# Patient Record
Sex: Female | Born: 1950 | Race: White | Hispanic: No | Smoking: Former smoker
Health system: Southern US, Community
[De-identification: ages and names within clinical notes are randomized; demographics above are authoritative.]

## PROBLEM LIST (undated history)

## (undated) DIAGNOSIS — H269 Unspecified cataract: Secondary | ICD-10-CM

## (undated) DIAGNOSIS — J449 Chronic obstructive pulmonary disease, unspecified: Secondary | ICD-10-CM

## (undated) HISTORY — DX: Chronic obstructive pulmonary disease, unspecified: J44.9

## (undated) HISTORY — DX: Unspecified cataract: H26.9

---

## 2006-09-16 HISTORY — PX: COLON SURGERY: SHX602

## 2006-09-16 HISTORY — PX: APPENDECTOMY: SHX54

## 2007-05-14 ENCOUNTER — Inpatient Hospital Stay (HOSPITAL_COMMUNITY): Admission: EM | Admit: 2007-05-14 | Discharge: 2007-05-19 | Payer: Self-pay | Admitting: Emergency Medicine

## 2007-05-14 ENCOUNTER — Encounter (INDEPENDENT_AMBULATORY_CARE_PROVIDER_SITE_OTHER): Payer: Self-pay | Admitting: Surgery

## 2007-05-14 IMAGING — CR DG CHEST 1V PORT
1 series · 1 of 1 positions shown · non-contrast
Comparison: none

CLINICAL DATA: 56-year-old with abdominal pain. 
PORTABLE CHEST:

[view not recorded]
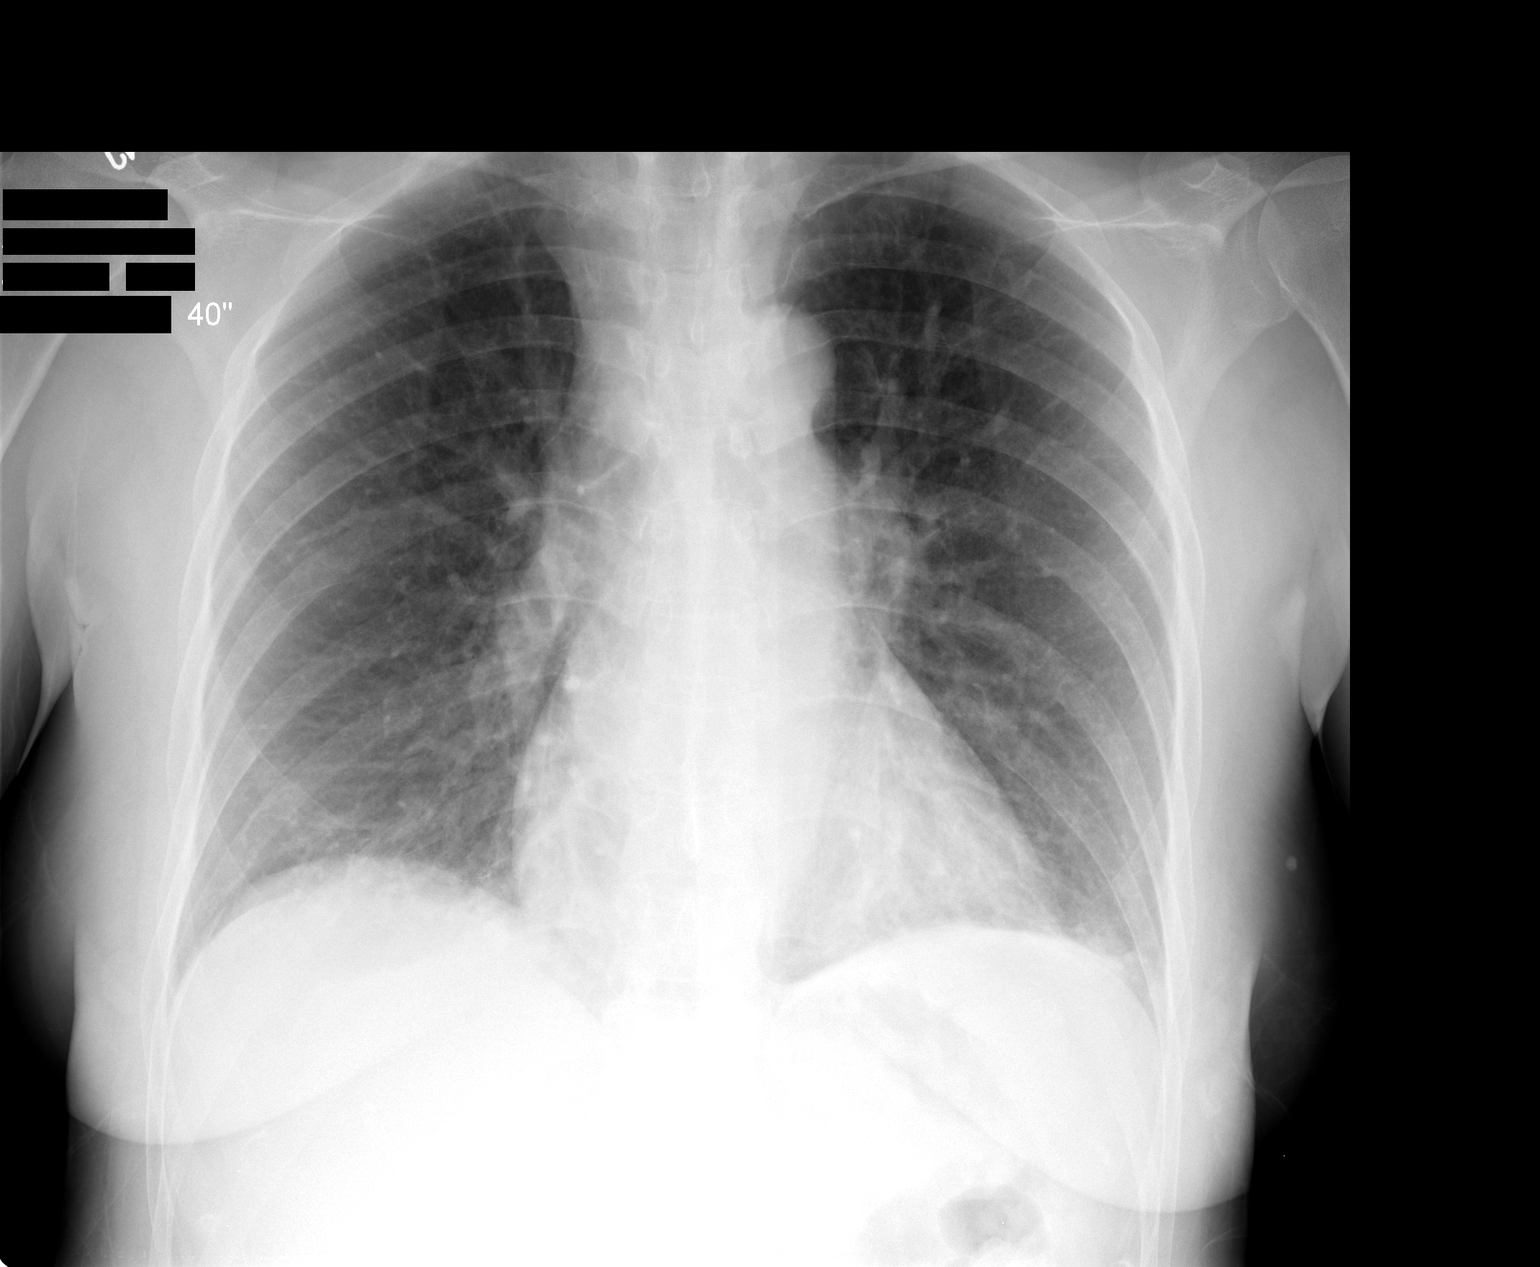

[1 of 1 positions shown; findings below may reference images not displayed]

FINDINGS: Single view of the chest demonstrates prominent vascular markings and there may be a small component of edema or even peribronchial thickening.  There is no focal airspace disease.  Heart size is within normal limits.  The trachea is midline.  Fullness along the right paratracheal region probably related to the technique and overlying bone.   Basilar densities are probably related to atelectasis based on the previous CT findings.  Small amount of edema or chronic changes.
IMPRESSION: Prominent vascular markings.  This could represent a small amount of edema versus chronic change.

## 2007-05-14 IMAGING — CT CT ABDOMEN W/ CM
2 of 6 series · 16 of 46 positions shown, 18 images · IV contrast (omnipaque)
Comparison: none

CLINICAL DATA: Lower abdominal pain.
ABDOMEN CT WITH CONTRAST:
TECHNIQUE: Multidetector CT imaging of the abdomen was performed following the standard protocol during bolus administration of intravenous contrast.
Contrast:  100 cc Omnipaque 300.
TECHNIQUE: Multidetector CT imaging of the pelvis was performed following the standard protocol during bolus administration of intravenous contrast.

[Series 2: abd_pel 5.0 b40f st · axial · 0.65mm/px · z∈[-342,+48]mm · 13 of 90 slices shown, 15 images]
[im 6/90  soft-tissue]
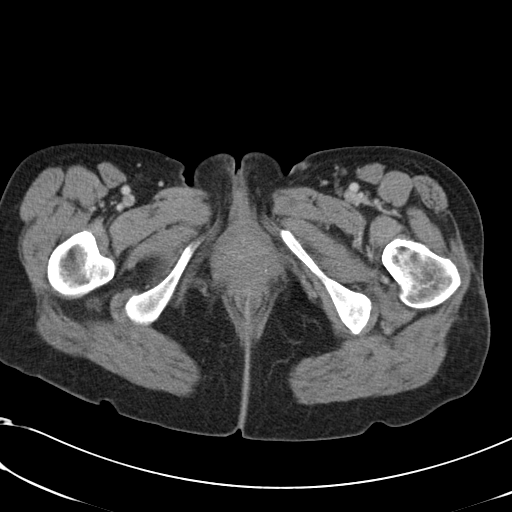
[im 6/90  bone]
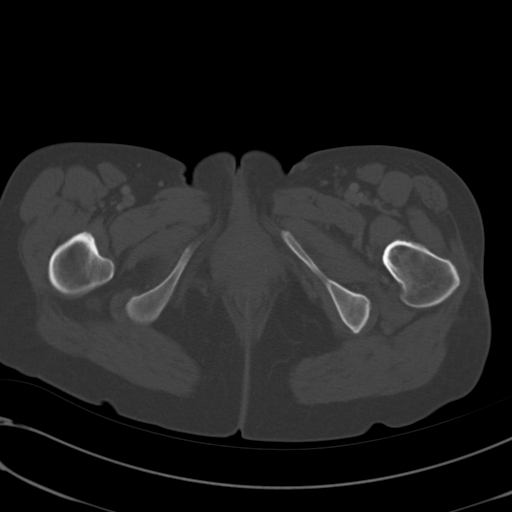
[im 12/90  soft-tissue]
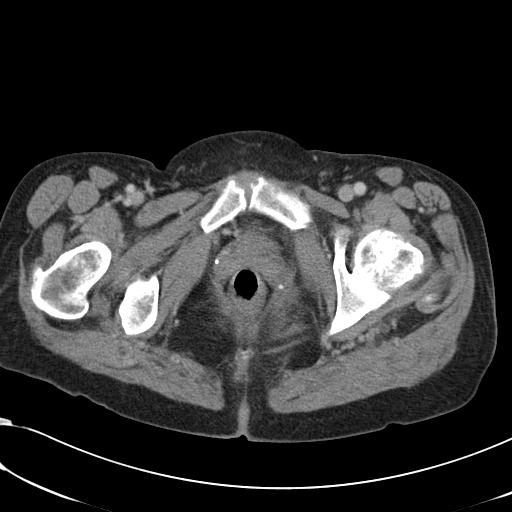
[im 17/90  soft-tissue]
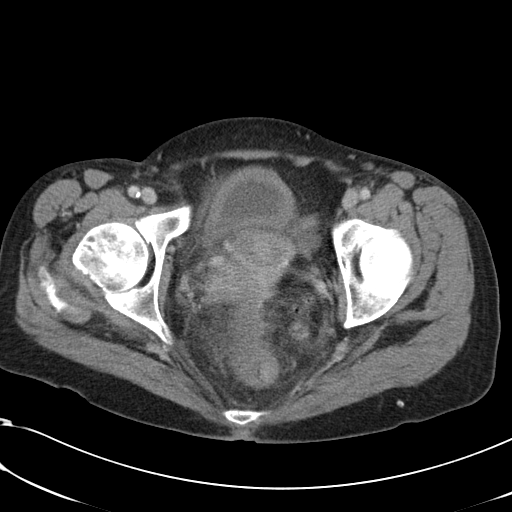
[im 28/90  soft-tissue]
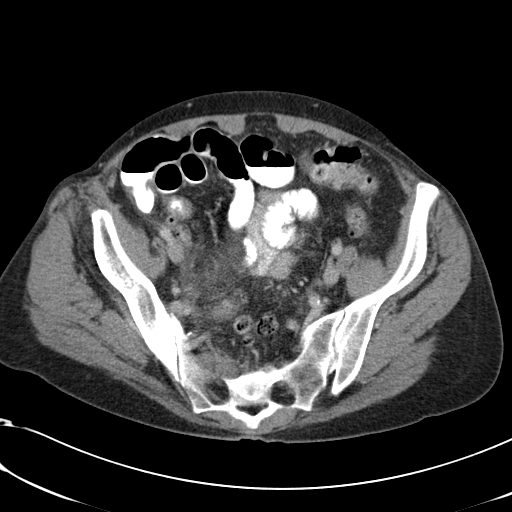
[im 34/90  soft-tissue]
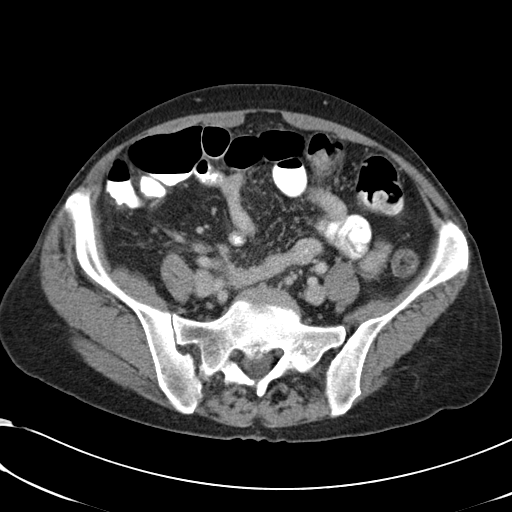
[im 39/90  soft-tissue]
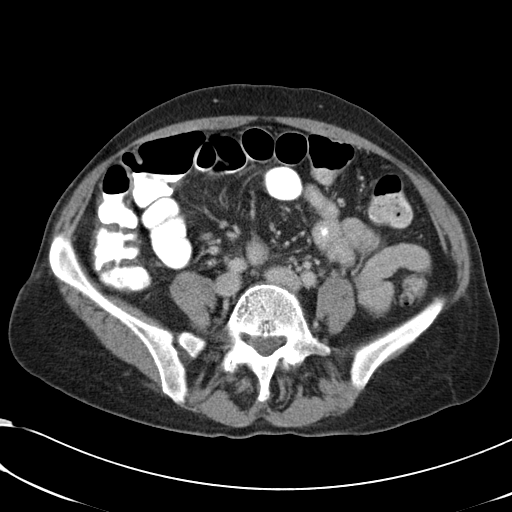
[im 45/90  soft-tissue]
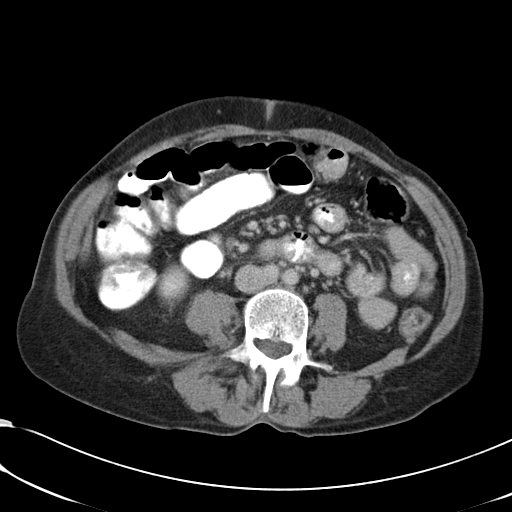
[im 51/90  soft-tissue]
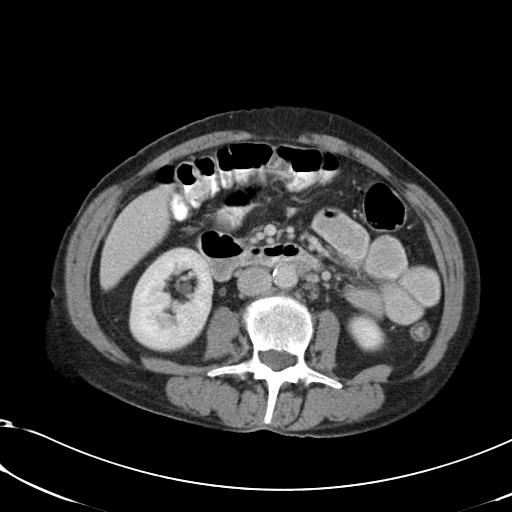
[im 56/90  soft-tissue]
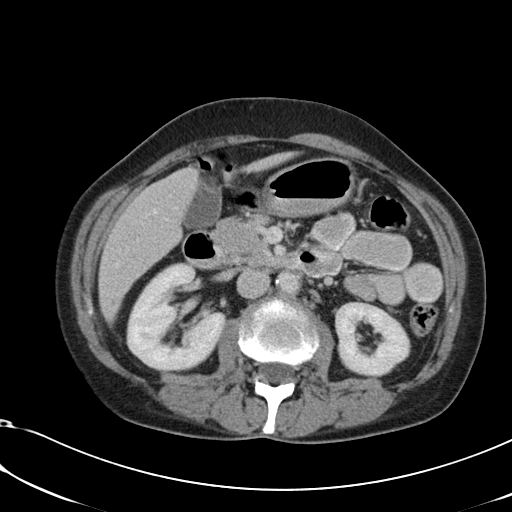
[im 56/90  bone]
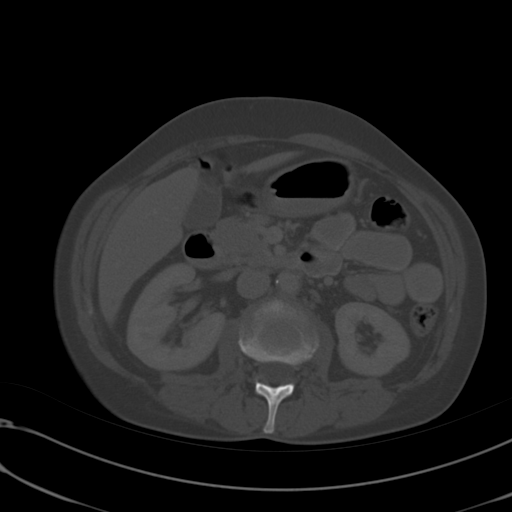
[im 62/90  soft-tissue]
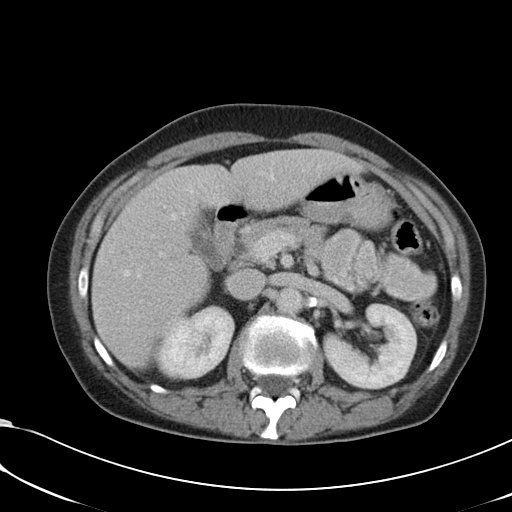
[im 73/90  soft-tissue]
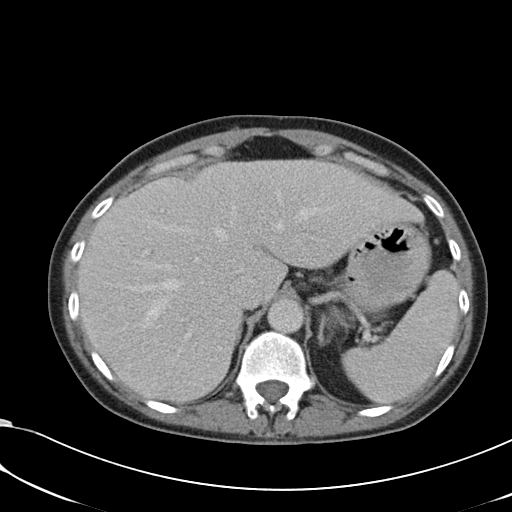
[im 78/90  soft-tissue]
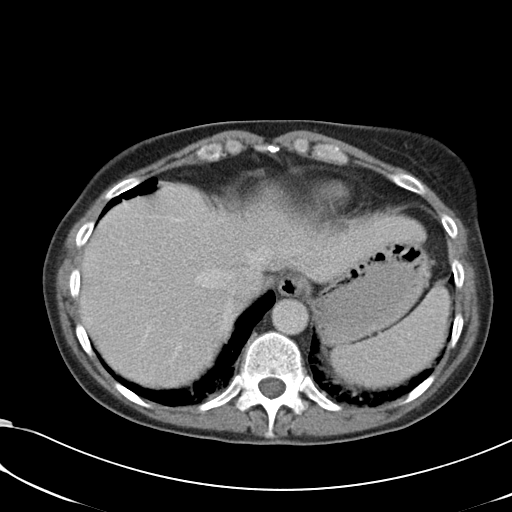
[im 84/90  soft-tissue]
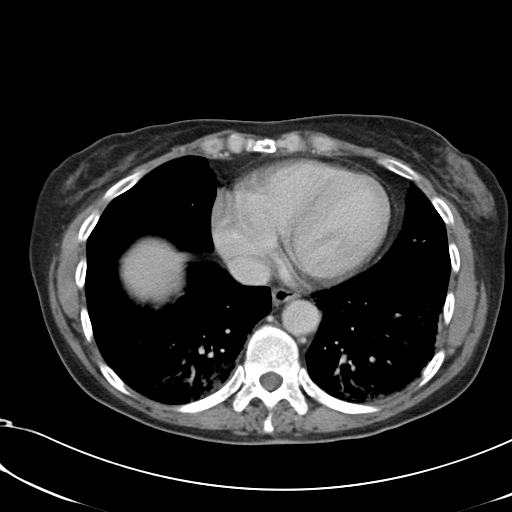

[Series 602: coronal iamges · coronal · 0.91mm/px · 3 of 73 slices shown]
[im 25/73  soft-tissue]
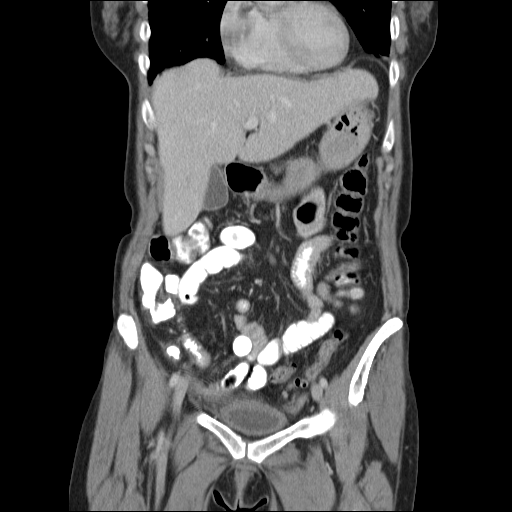
[im 33/73  soft-tissue]
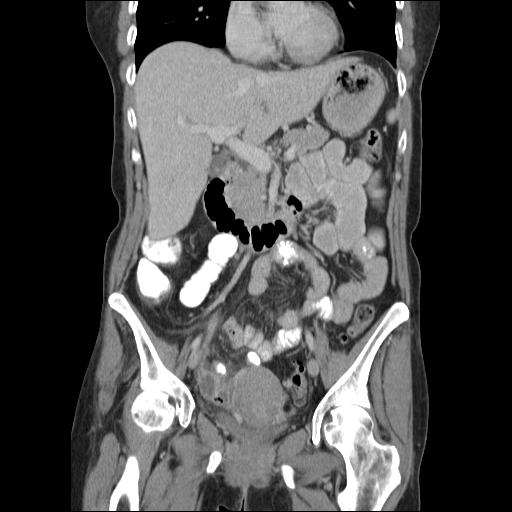
[im 41/73  soft-tissue]
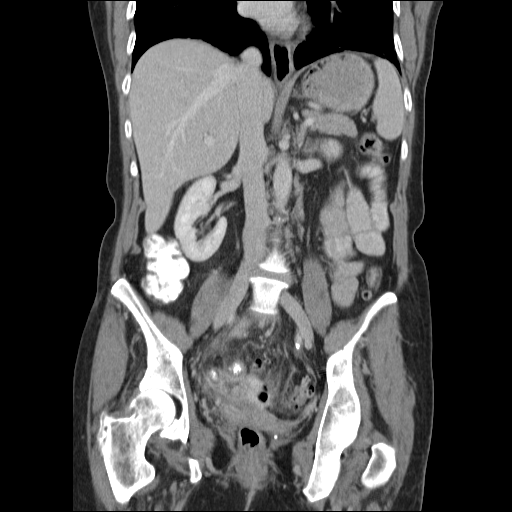

[16 of 46 positions shown; findings below may reference images not displayed]

FINDINGS: The patient has bibasilar atelectasis.  The liver, portal venous system, spleen and pancreas are within normal limits.  There is a 4 mm low density structure along the medial aspect of the uncinate process that is indeterminate.  Pancreatic duct is slightly prominent.   appearance of the adrenal tissue and kidneys.  There is stranding throughout the right lower quadrant with thickened small bowel loops.  The most prominent small bowel thickening involves the terminal ileum.  Lateral to the thickened terminal ileum there are small foci of gas that appear to be extending from the appendix and there are small foci of extraluminal fluid and gas worrisome for small abscess collections.  There is a larger gas-fluid collection posterior to the uterus measuring 4.8 X 3.6 cm also consistent with an abscess collection.  There are multiple diverticula associated with the sigmoid colon.  There is also stranding in the sigmoid mesocolon.  There may be a few small locules of extraluminal gas adjacent to the sigmoid colon.  A few areas of small bowel wall thickening throughout the abdomen.  No acute bone abnormalities.
IMPRESSION: Lower abdominal and pelvic inflammation with evidence of abscess collections.  The largest abscess cavity is posterior to the uterus and adjacent to the sigmoid colon.  There is additional gas and inflammation in the right lower quadrant near the tip of the appendix.  Findings could be related to a rupture of the appendix tip.  A primary or even secondary colonic diverticular abscess cannot be excluded.  Inflammation throughout the right lower quadrant mesentery with loops of small bowel wall thickening. 
Small area of low density along the medial aspect of the uncinate process.  On the delayed images, this probably represents adjacent fat rather than a true pancreatic lesion.   Recommend attention to this area on follow up studies.
PELVIS CT WITH CONTRAST:
FINDINGS: Extensive inflammation in the right lower quadrant with abscess cavities as described in the CT of the abdomen.  Some thickening of the rectum which may be reactive in nature.  Patient also has multiple colonic diverticula involving the sigmoid colon.  No acute bone abnormalities.
IMPRESSION: Extensive inflammation in the pelvis with abscesses.  Please refer to the abdominal CT.

## 2007-05-29 ENCOUNTER — Encounter: Admission: RE | Admit: 2007-05-29 | Discharge: 2007-05-29 | Payer: Self-pay | Admitting: Surgery

## 2007-05-29 ENCOUNTER — Inpatient Hospital Stay (HOSPITAL_COMMUNITY): Admission: AD | Admit: 2007-05-29 | Discharge: 2007-06-12 | Payer: Self-pay | Admitting: Surgery

## 2007-05-29 IMAGING — CT CT PELVIS W/ CM
2 of 6 series · 13 of 32 positions shown, 18 images · IV contrast (30CC OMNI 350 & [ID] OMNI 300)
Comparison: CT [DATE]

ABDOMEN CT WITH CONTRAST

CLINICAL DATA: Surgery 2 weeks ago. Fever and abdominal pain.
TECHNIQUE: Multidetector CT imaging of the abdomen and pelvis was performed
following the standard protocol during bolus administration of intravenous
contrast.

Contrast:  100 cc Omnipaque 300

[Series 2: abdomen w/ · axial · 0.65mm/px · z∈[-276,-11]mm · 5 of 81 slices shown, 10 images]
[im 14/81  soft-tissue]
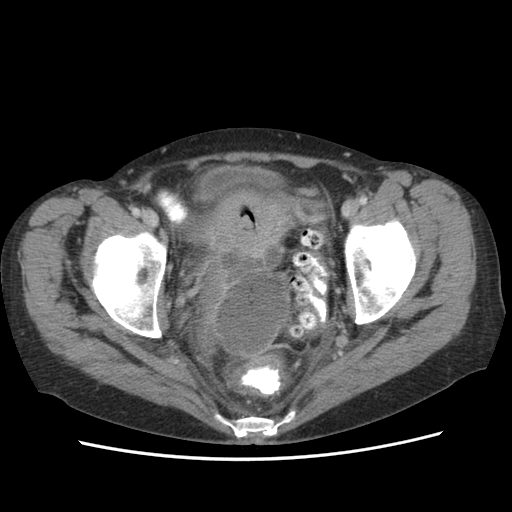
[im 14/81  bone]
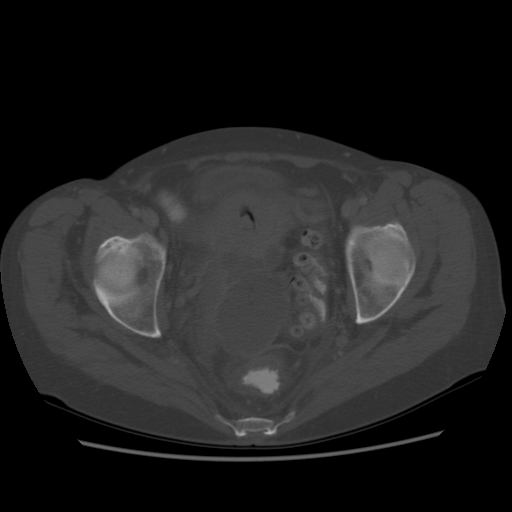
[im 27/81  soft-tissue]
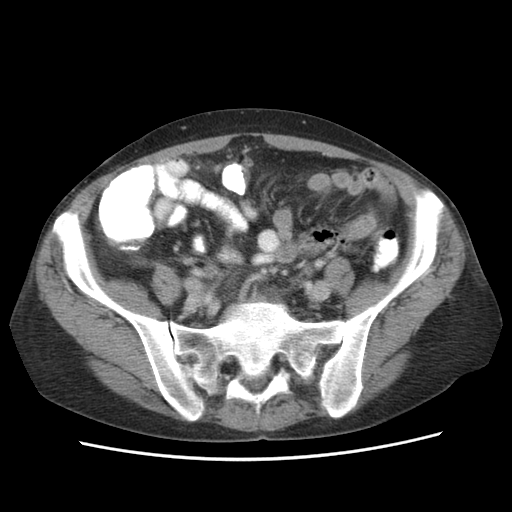
[im 27/81  lung]
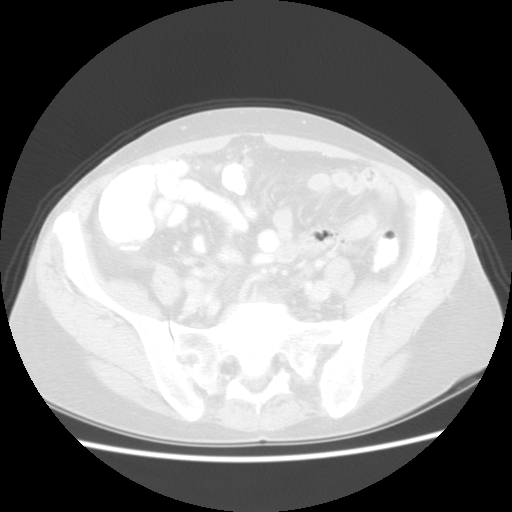
[im 41/81  soft-tissue]
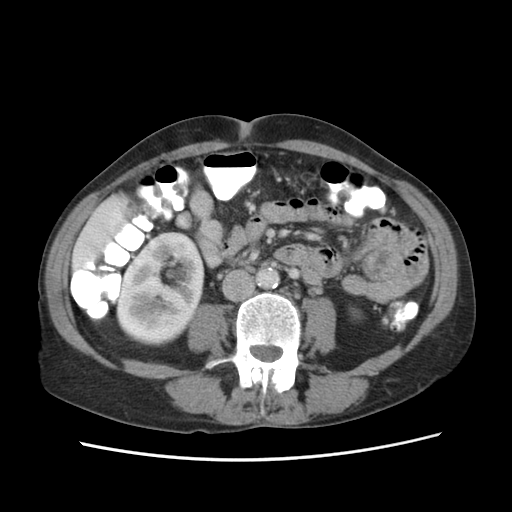
[im 41/81  lung]
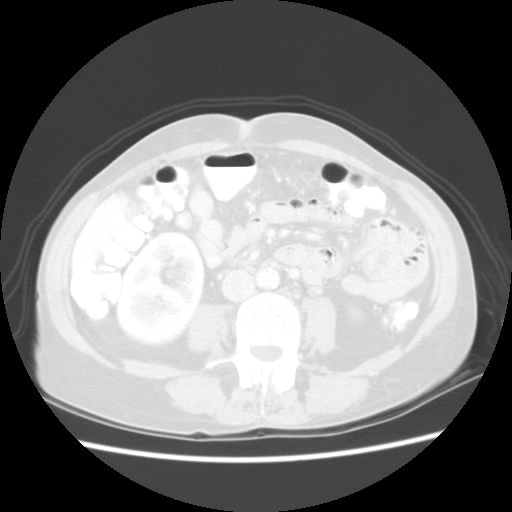
[im 54/81  soft-tissue]
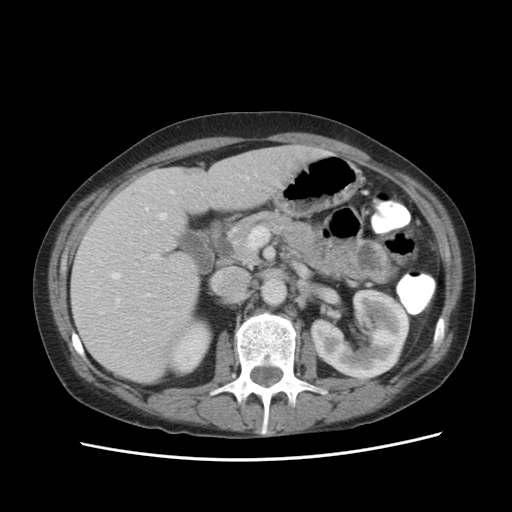
[im 54/81  lung]
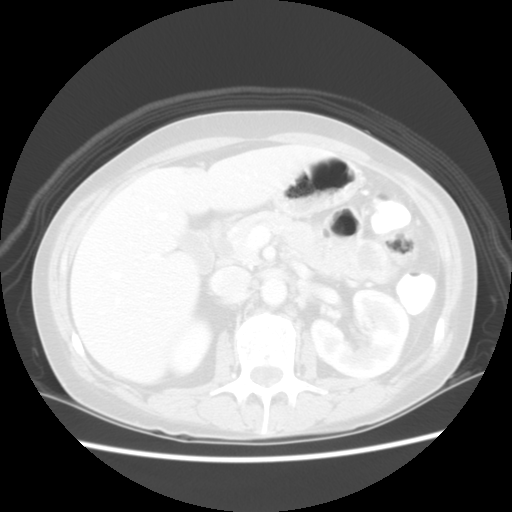
[im 67/81  soft-tissue]
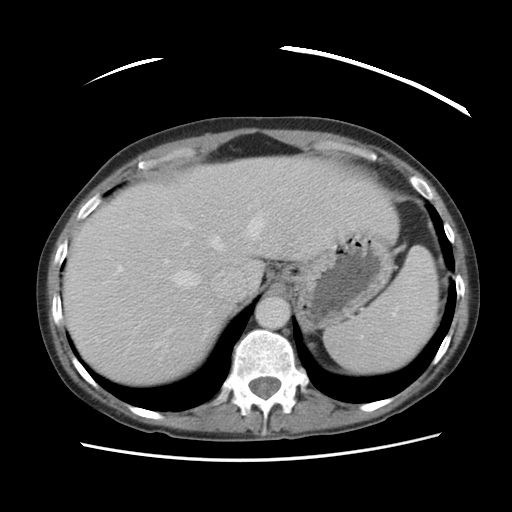
[im 67/81  lung]
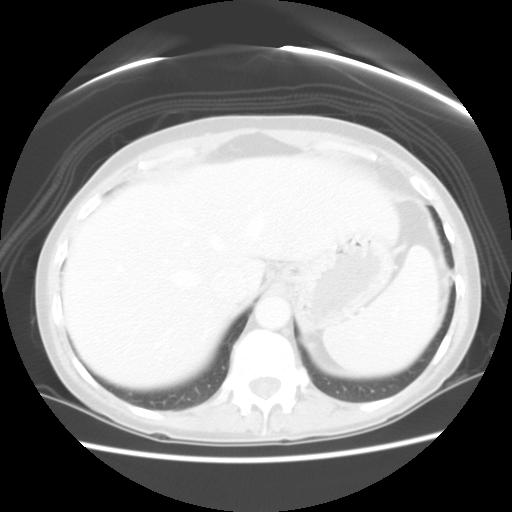

[Series 401: sagittal · sagittal · 0.80mm/px · 8 of 118 slices shown]
[im 14/118  soft-tissue]
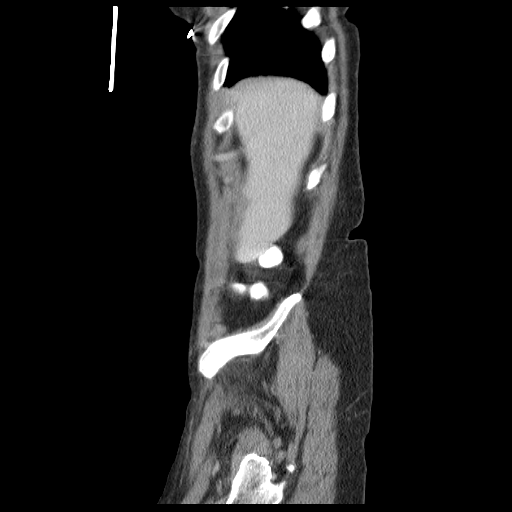
[im 27/118  soft-tissue]
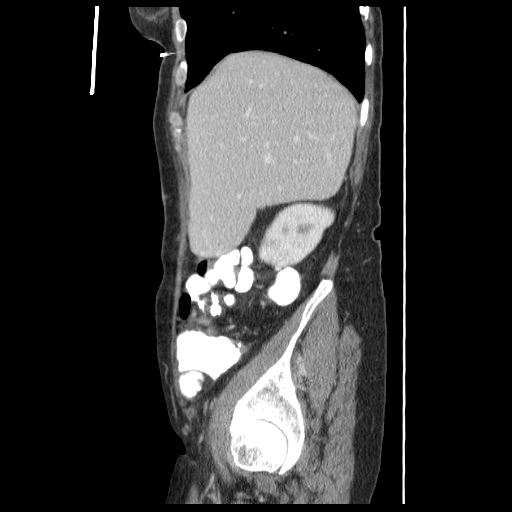
[im 40/118  soft-tissue]
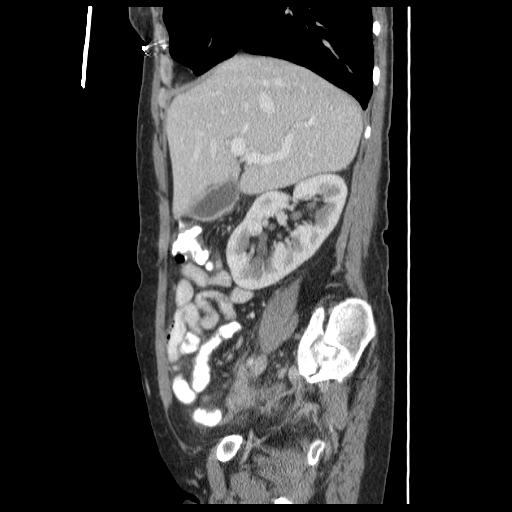
[im 53/118  soft-tissue]
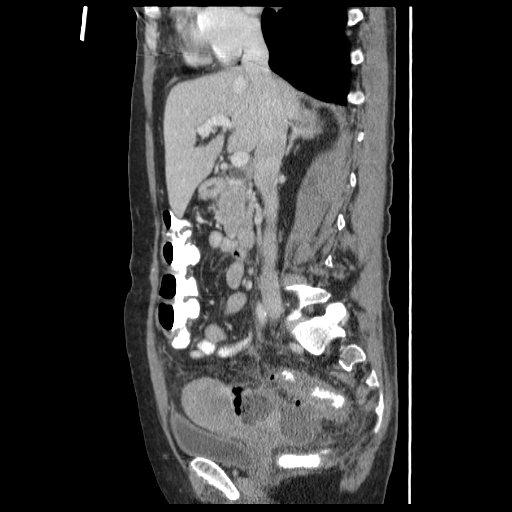
[im 66/118  soft-tissue]
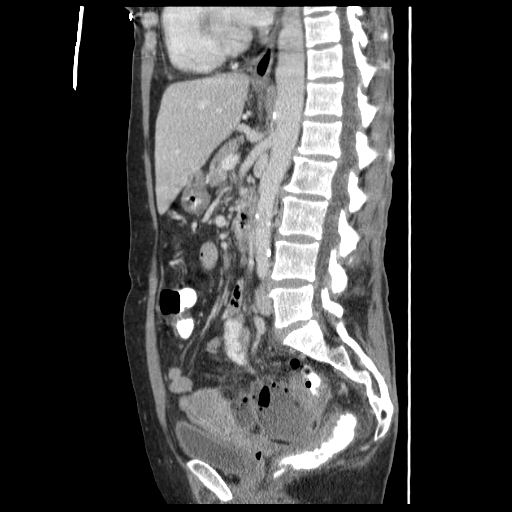
[im 79/118  soft-tissue]
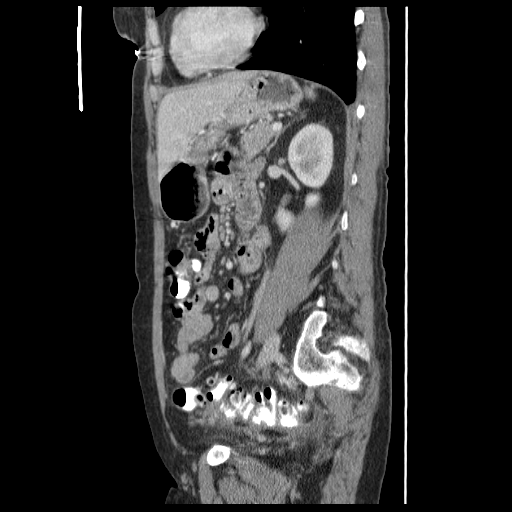
[im 92/118  soft-tissue]
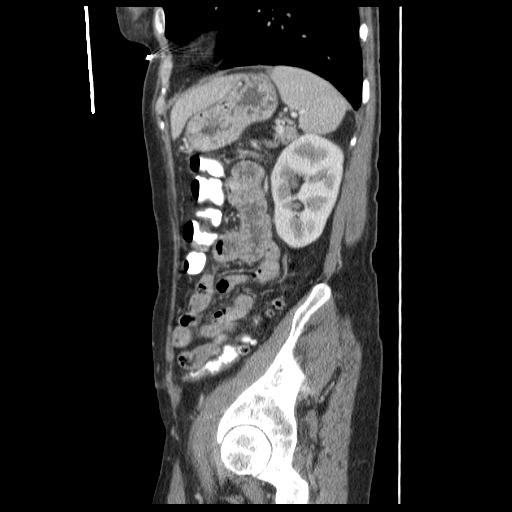
[im 105/118  soft-tissue]
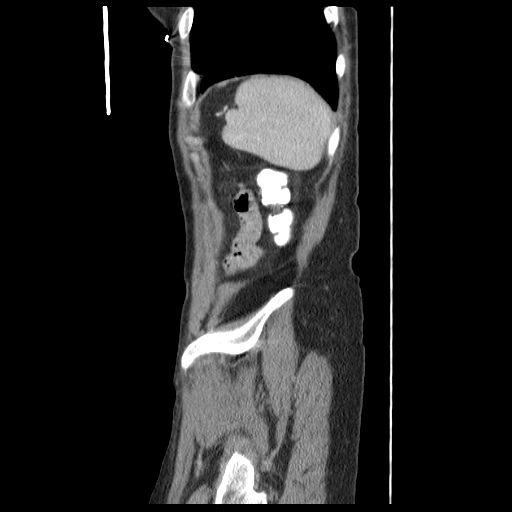

[13 of 32 positions shown; findings below may reference images not displayed]

FINDINGS: Mild centrilobular emphysema at lung bases. Normal heart size without
pericardial or pleural effusion.

Too small to characterize  lesion in the right lobe of the liver on image 35.
Likely a cyst. Perfusion anomaly adjacent to falciform ligament. Tiny likely
benign lesion splenic dome on image 11.

Normal stomach, pancreas, gallbladder, biliary tree, adrenal glands. Normal
kidneys. Atherosclerosis at the left renal artery origin.

No retrocrural or retroperitoneal adenopathy.

Normal abdominal portions of colon. Terminal ileum normal image 54.

Normal abdominal small bowel loops.

IMPRESSION

1. No acute abdominal process.
2. Age advanced atherosclerosis of left renal artery origin. No evidence of
renal atrophy or other sequela of renal artery stenosis, which cannot be
excluded.

PELVIS CT WITH CONTRAST
FINDINGS: New rectal wall thickening. Increased sigmoid colon wall thickening
superimposed on underlying diverticulosis. Pelvic small bowel loops normal. 

Multiseptated abscesses. Largest component centrally at 4.4 x 5.9 cm on image
69. Increased from 4.8 x 3.6 on prior exam. Contiguous component extends towards
the right adnexa where a collection measures approximately 5.5 x 3.6 cm. Image
65. More superiorly gas tracks along the right gonadal vein.   

Hyperdensity within the left side of the largest abscess on image 65 series 2 is
suspicious for contrast leak versus focal diverticula. Image 65 series 2.

No pelvic adenopathy.

New gas within the endometrium on images 66-67. No free perforation.

Normal bones.

IMPRESSION

1. Progression of pelvic abscess or abscesses as described.
2. Progressive rectal and sigmoid wall thickening likely relates to
diverticulitis.
3. New air within the endometrium. Presuming no recent instrumentation, this is
suspicious for secondary endometritis. Given the collection in the right adnexal
and gas tracking along the right gonadal vein, infectious along the right
fallopian tube or broad ligament is suspected. 
4. Small focus of increased density within the above-described abscess could
relate to contrast leak from adjacent sigmoid colon or a contrast-filled
diverticulum.

 I called and personally discussed this report with Dr. BRITTANIE , at [DATE] p.m. on
[DATE] .

## 2007-05-30 IMAGING — CT CT ABCESS DRAINAGE
1 of 6 series · 14 of 32 positions shown, 19 images · non-contrast
Comparison: none

CLINICAL DATA: Large pelvic abscess.
CT GUIDED ABSCESS DRAINAGE:
Medications:  Versed 2 mg and Fentanyl 50 mcg.
Procedure:  Informed consent was obtained from the patient.  The risks of the procedure were explained to the patient.  The patient was placed in prone position.  Large abscess collection anterior the rectum was identified.  Compared to the previous diagnostic study, there is now oral contrast within this abscess cavity consistent with a bowel leak.  The right buttock was prepped and draped in sterile fashion.  Skin was anesthetized with 1% lidocaine.  An 18-gauge needle was directed from a transgluteal approach into the large pelvic abscess.  An Amplatz wire was placed.  The track was dilated and 12-French drainage catheter was placed.  100 cc of purulent fluid was removed.  The patient tolerated the procedure well without complication.  The catheter was secured to the skin.

[Series 2: stone_wo 5.0 b40f st · axial · 0.67mm/px · z∈[-1019,-799]mm · 14 of 63 slices shown, 19 images]
[im 4/63  soft-tissue]
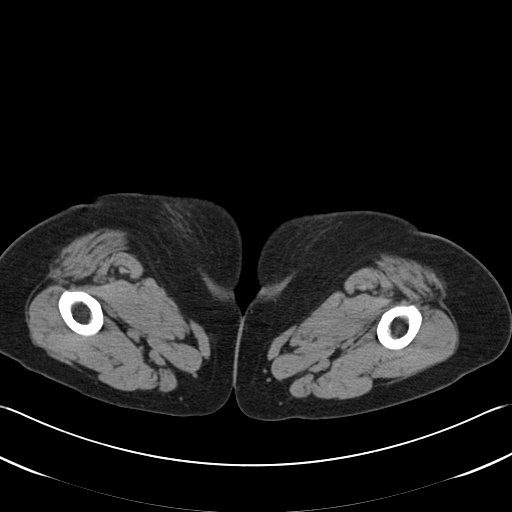
[im 4/63  bone]
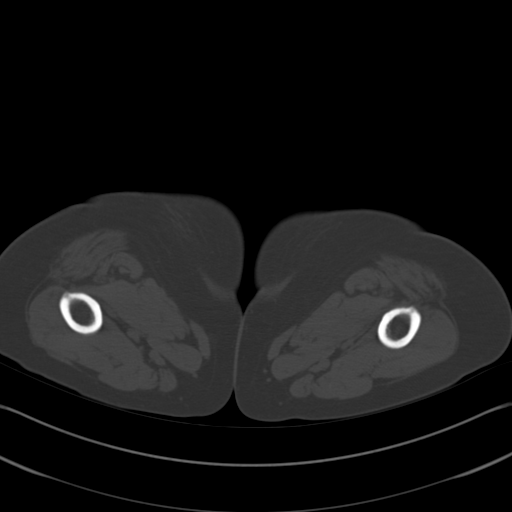
[im 8/63  soft-tissue]
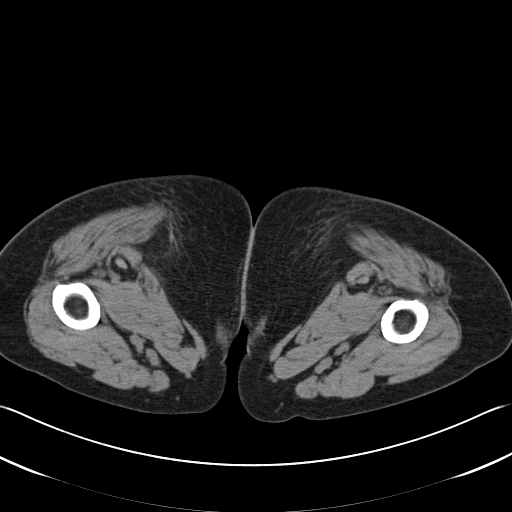
[im 12/63  soft-tissue]
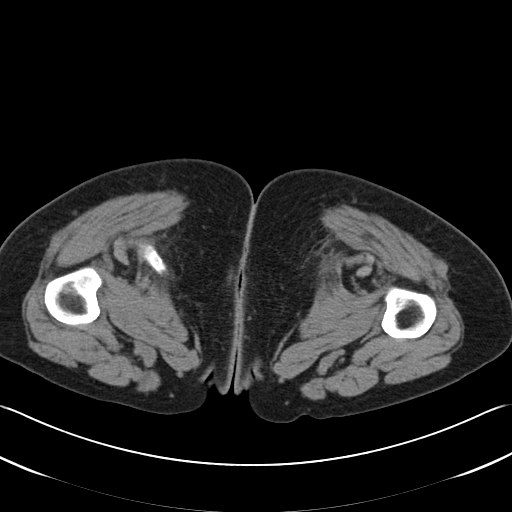
[im 20/63  soft-tissue]
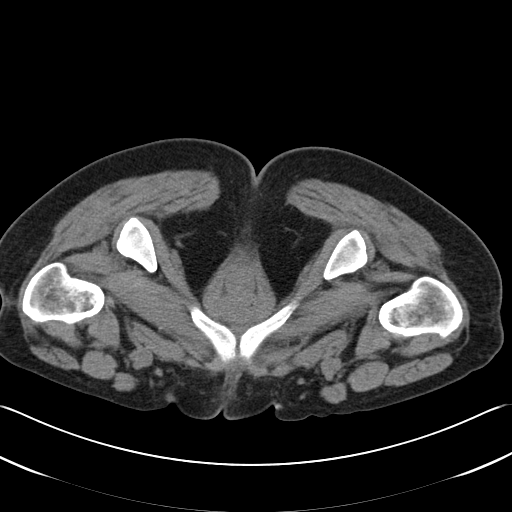
[im 24/63  soft-tissue]
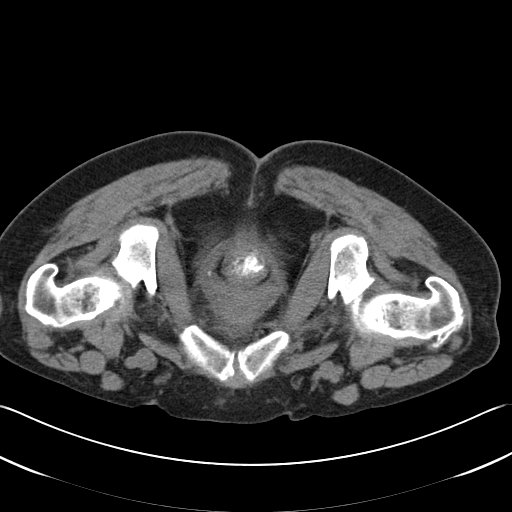
[im 28/63  soft-tissue]
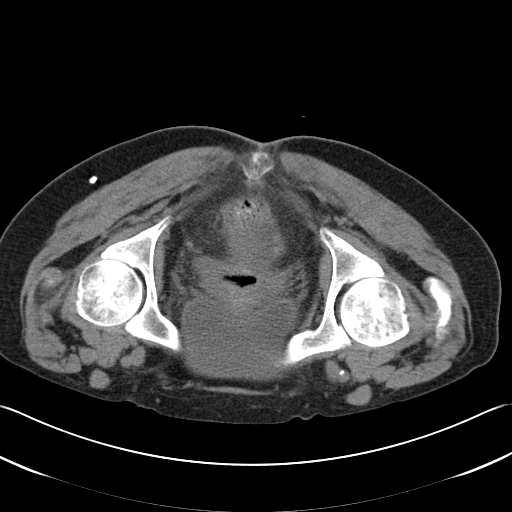
[im 32/63  soft-tissue]
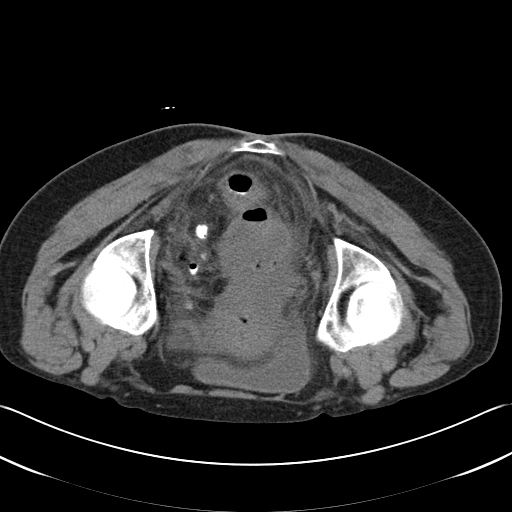
[im 35/63  soft-tissue]
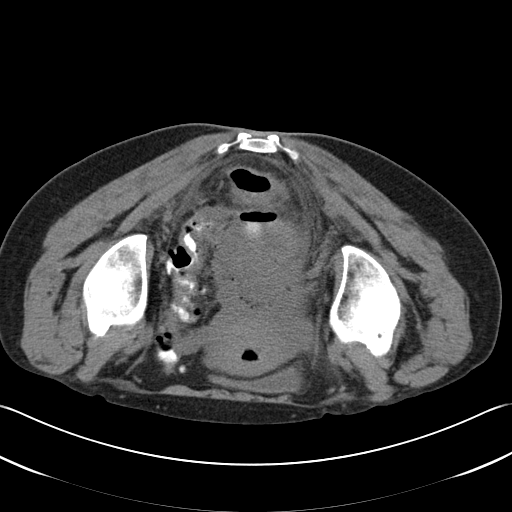
[im 39/63  soft-tissue]
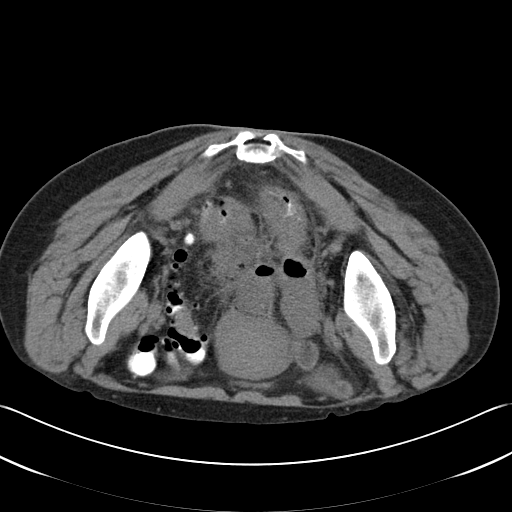
[im 39/63  bone]
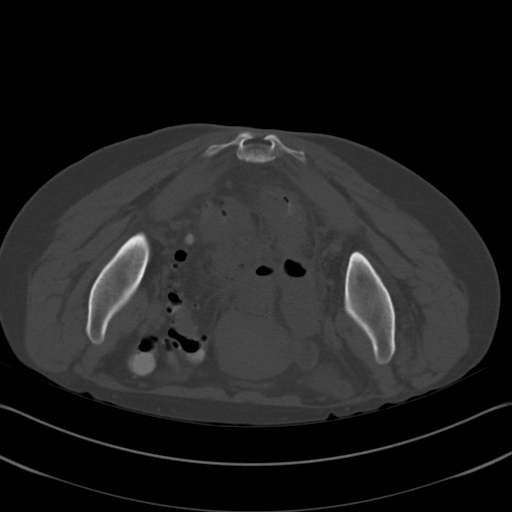
[im 43/63  soft-tissue]
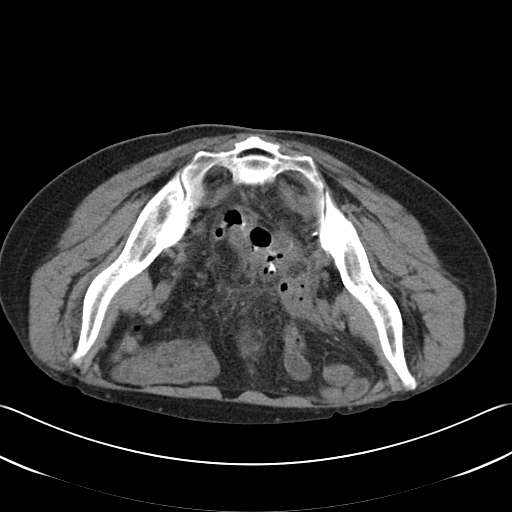
[im 47/63  lung]
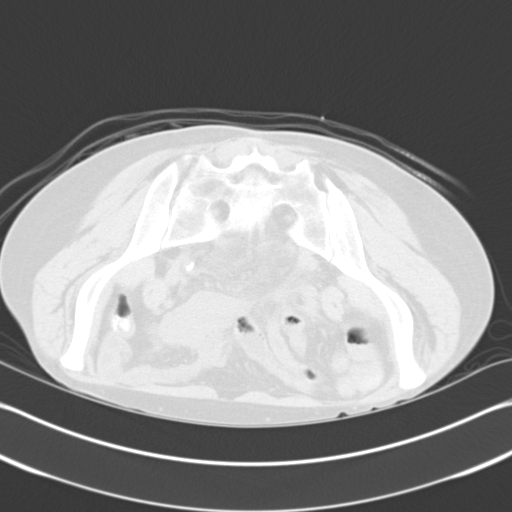
[im 51/63  soft-tissue]
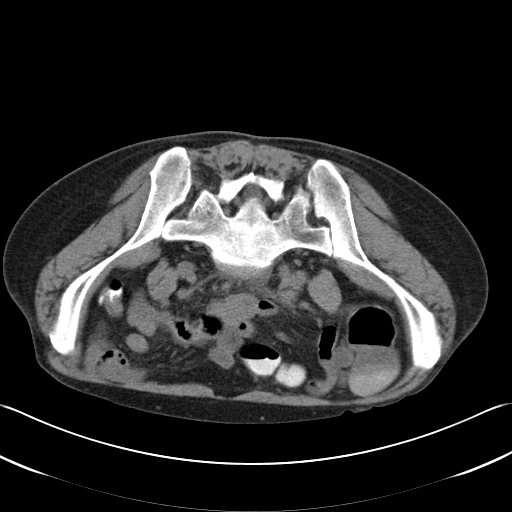
[im 51/63  lung]
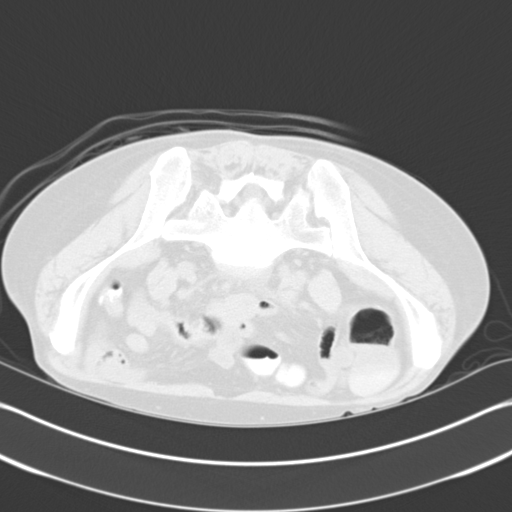
[im 55/63  soft-tissue]
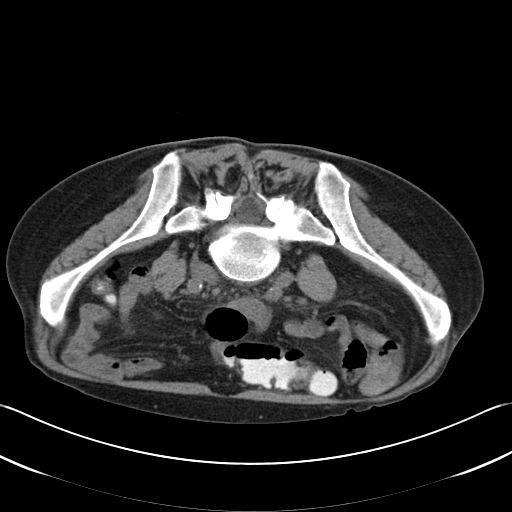
[im 55/63  lung]
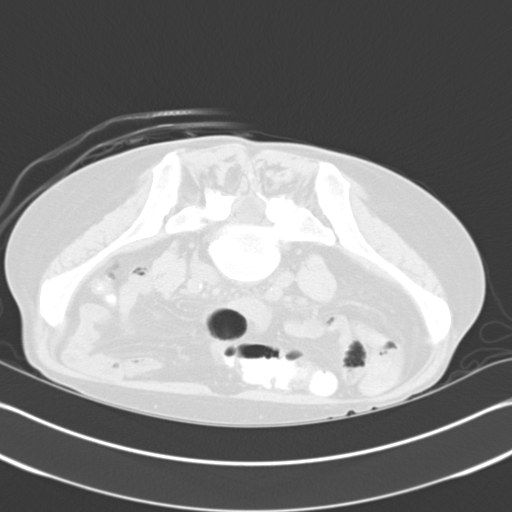
[im 59/63  soft-tissue]
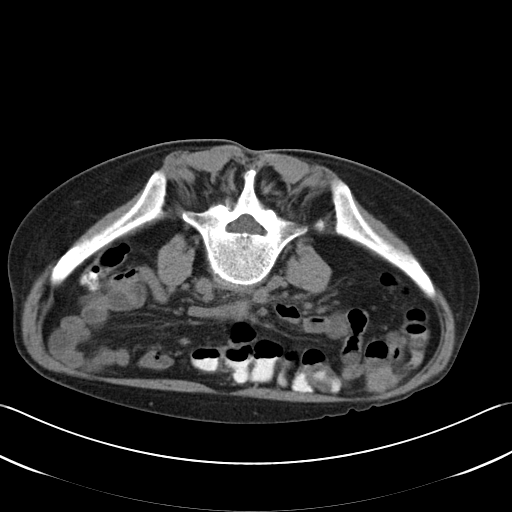
[im 59/63  lung]
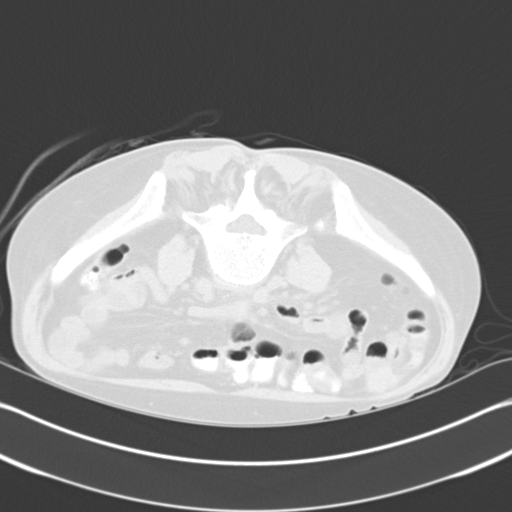

[14 of 32 positions shown; findings below may reference images not displayed]

IMPRESSION: Successful placement of a percutaneous abscess drain from a transgluteal approach.

## 2007-06-04 IMAGING — RF DG SINUS / FISTULA TRACT / ABSCESSOGRAM
8 series · 8 of 8 positions shown · IV contrast (omnipaque)
Comparison: none

CLINICAL DATA: Diverticular abscess.
SINUS TRACT INJECTION:
Procedure:  With the patient prone, the drainage catheter was injected.  Initially, the abscess pouch filled. Almost immediately, one could see communication through a fistulous channel, with the sigmoid colon.  Only 6 cc of Omnipaque 300 was needed to demonstrate the communication with the sigmoid.  The catheter was drained of the contrast injected, and it was flushed with saline.

[Series 1: run · 1 of 1 slices shown (1 of 7)]
[im 1/1]
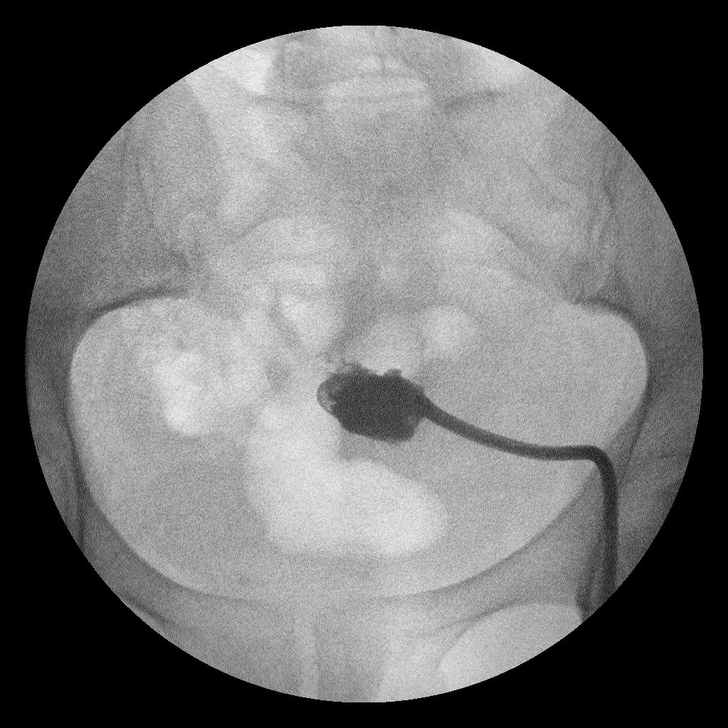

[Series 2: run · 1 of 1 slices shown (2 of 7)]
[im 1/1]
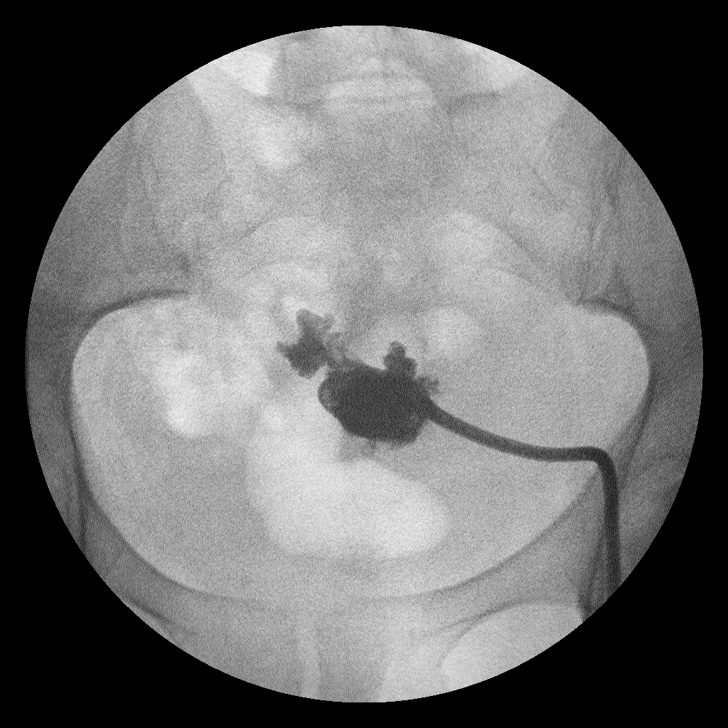

[Series 3: run · 1 of 1 slices shown (3 of 7)]
[im 1/1]
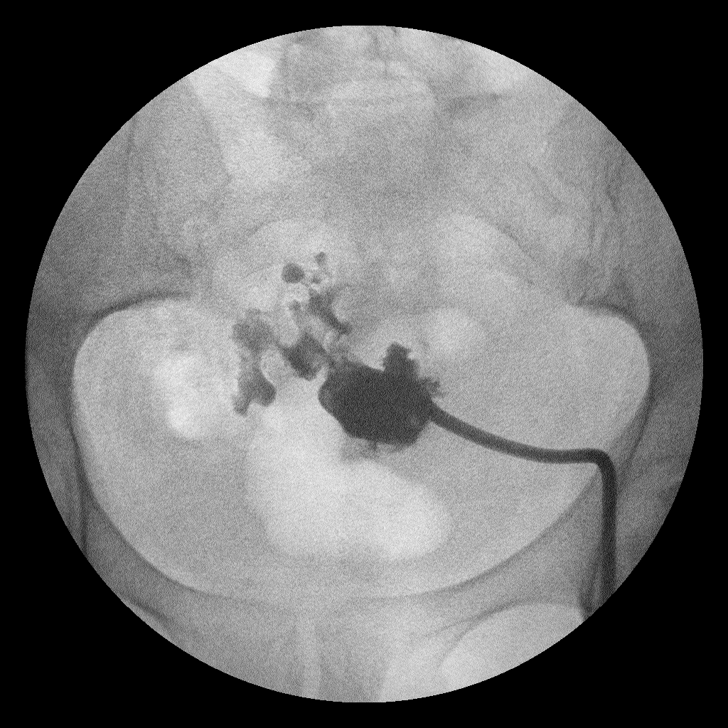

[Series 4: run · 1 of 1 slices shown (4 of 7)]
[im 1/1]
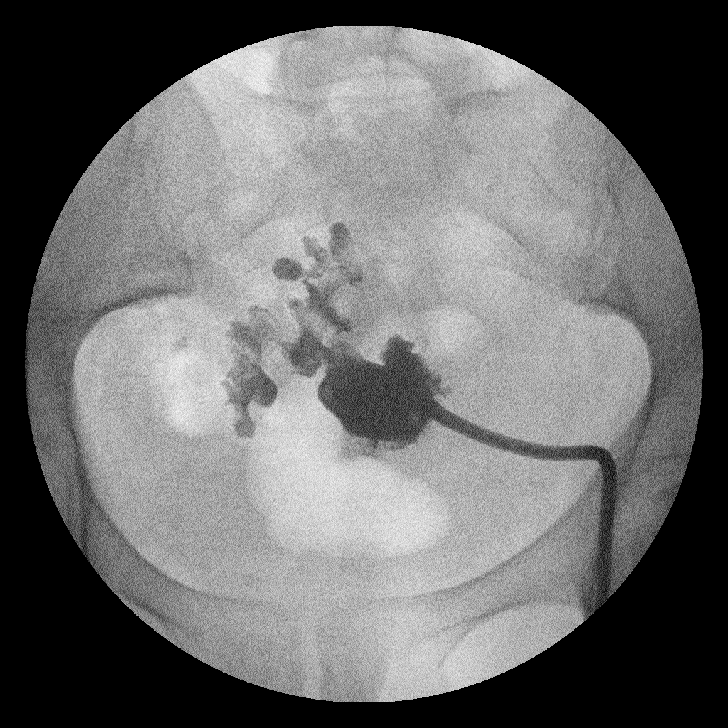

[Series 5: run · 1 of 1 slices shown (5 of 7)]
[im 1/1]
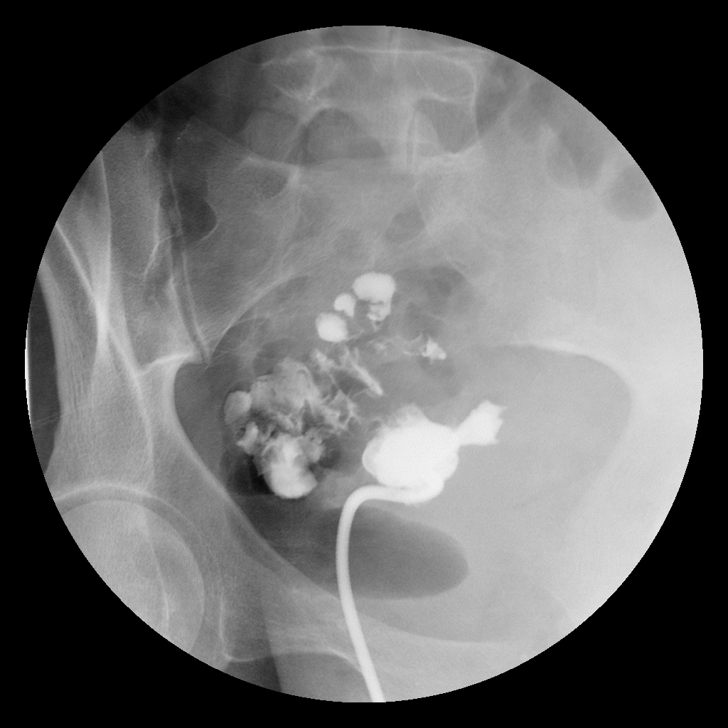

[Series 6: run · 1 of 1 slices shown (6 of 7)]
[im 1/1]
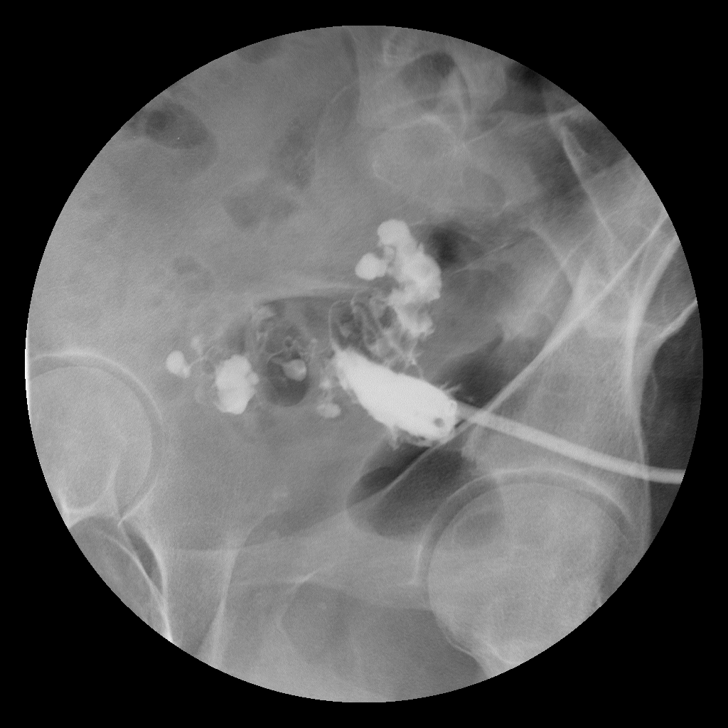

[Series 7: run · 1 of 1 slices shown (7 of 7)]
[im 1/1]
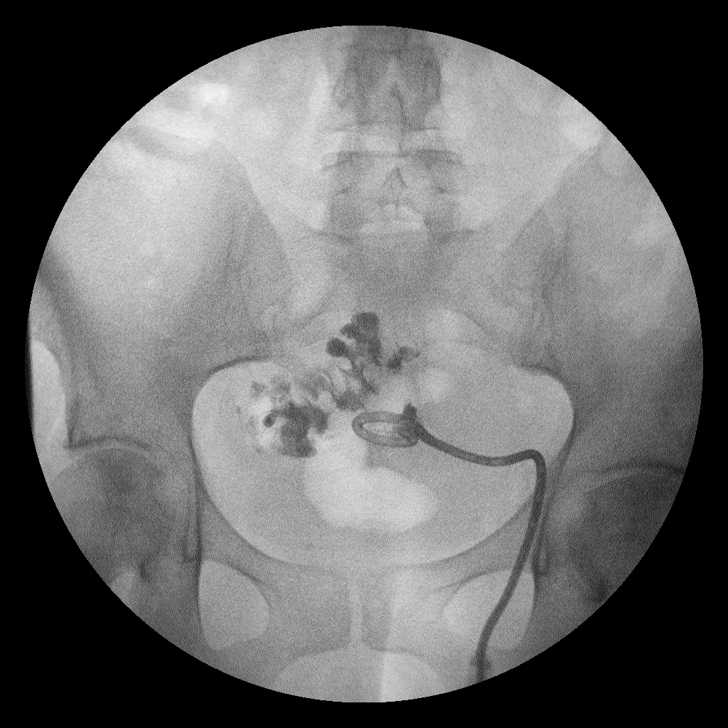

[Series 1001: view not recorded · 0.20mm/px · 1 of 1 slices shown]
[im 1/1]
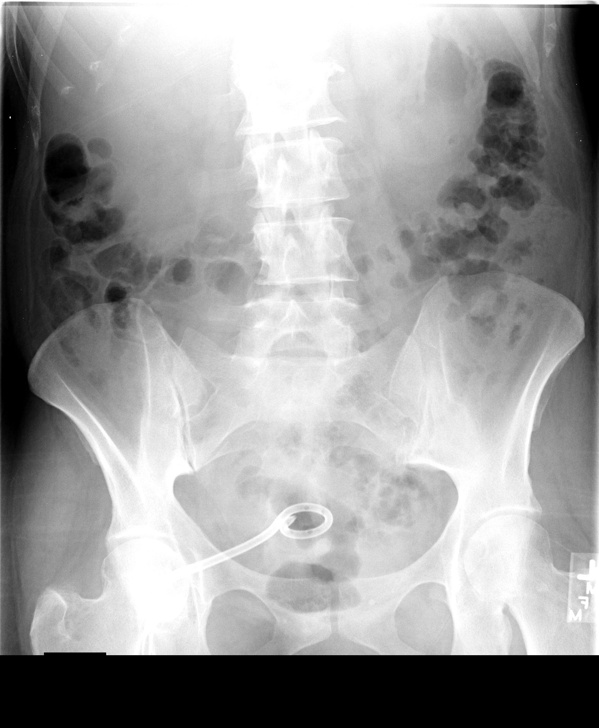

[8 of 8 positions shown; findings below may reference images not displayed]

IMPRESSION: Abscess cavity shows communication with the sigmoid colon.

## 2007-06-05 ENCOUNTER — Encounter (INDEPENDENT_AMBULATORY_CARE_PROVIDER_SITE_OTHER): Payer: Self-pay | Admitting: Surgery

## 2007-09-17 HISTORY — PX: COLOSTOMY REVERSAL: SHX5782

## 2007-11-13 ENCOUNTER — Encounter: Admission: RE | Admit: 2007-11-13 | Discharge: 2007-11-13 | Payer: Self-pay | Admitting: Surgery

## 2007-11-13 IMAGING — RF DG BE THRU COLOSTOMY
17 of 18 series · 17 of 18 positions shown · IV contrast (agent unspecified)
Comparison: none

CLINICAL DATA: History of diverticulitis.  Pre op for colostomy take down. 
DIAGNOSTIC COLON WITH CONTRAST MEDIA THROUGH COLOSTOMY AND PER RECTAL:

[Series 1: run · 1 of 1 slices shown (1 of 15)]
[im 1/1]
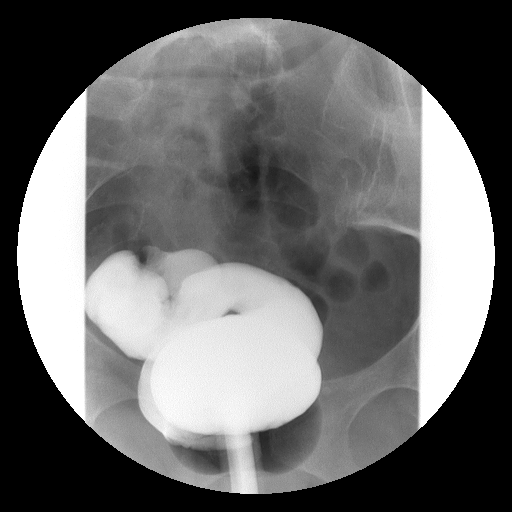

[Series 2: run · 1 of 1 slices shown (2 of 15)]
[im 1/1]
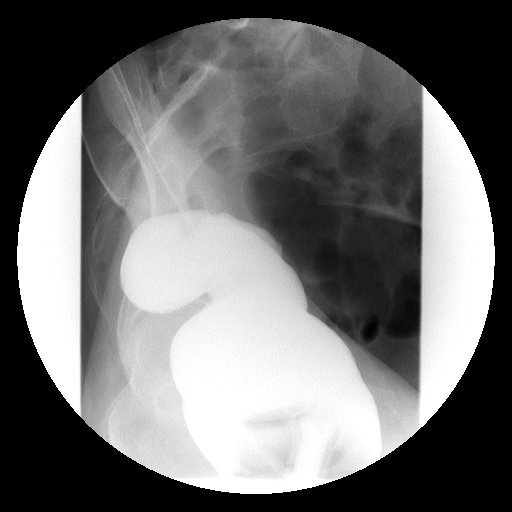

[Series 3: run · 1 of 1 slices shown (3 of 15)]
[im 1/1]
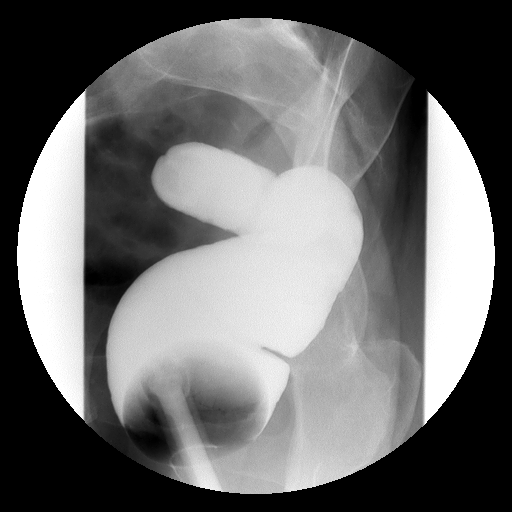

[Series 4: run · 1 of 1 slices shown (4 of 15)]
[im 1/1]
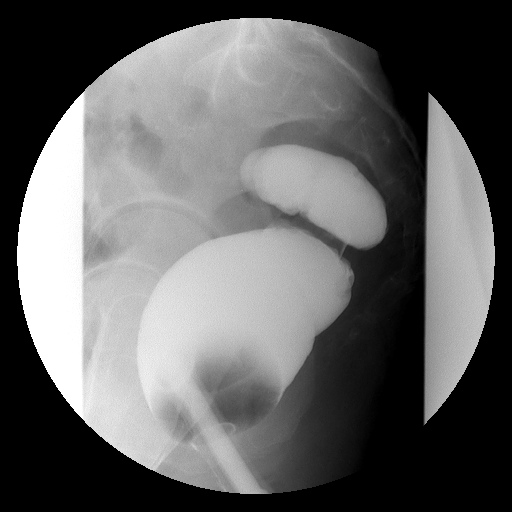

[Series 5: run · 1 of 1 slices shown (5 of 15)]
[im 1/1]
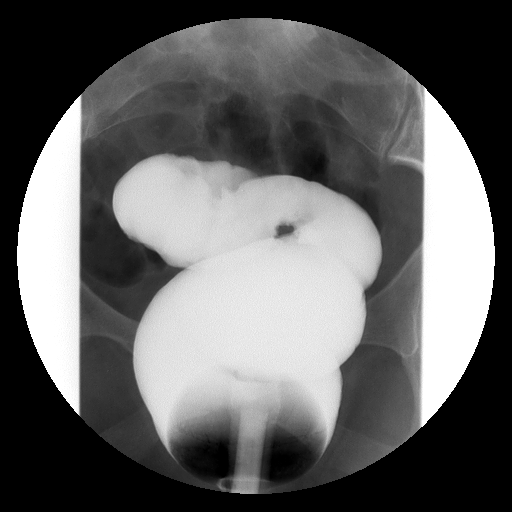

[Series 6: run · 1 of 1 slices shown (6 of 15)]
[im 1/1]
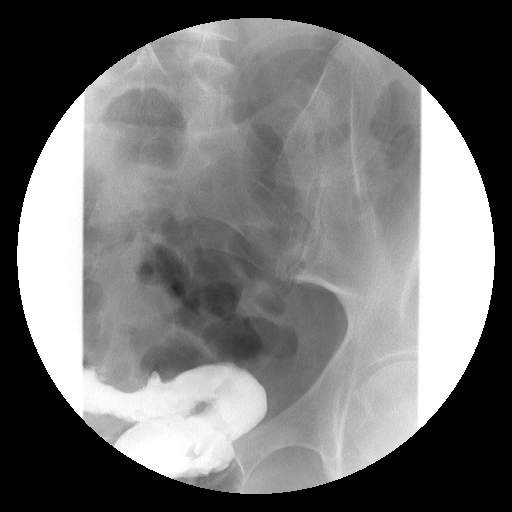

[Series 7: run · 1 of 1 slices shown (7 of 15)]
[im 1/1]
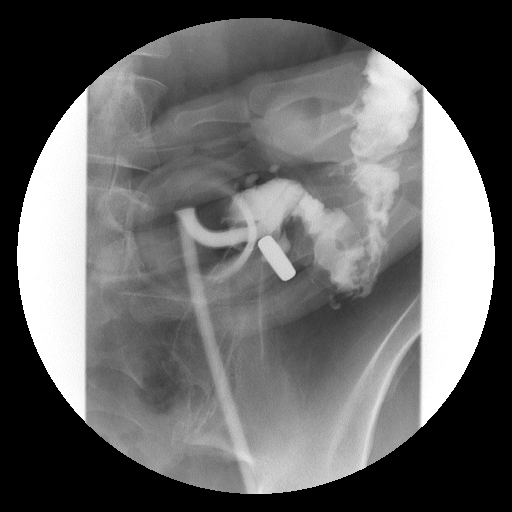

[Series 8: run · 1 of 1 slices shown (8 of 15)]
[im 1/1]
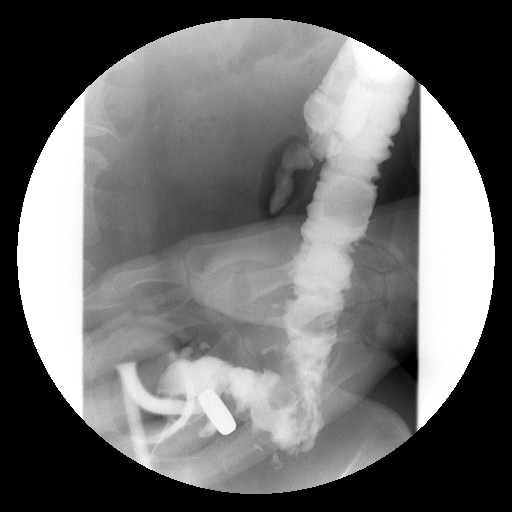

[Series 10: run · 1 of 1 slices shown (9 of 15)]
[im 1/1]
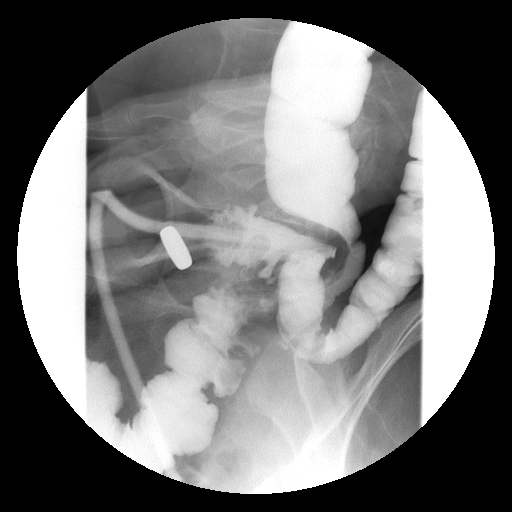

[Series 11: run · 1 of 1 slices shown (10 of 15)]
[im 1/1]
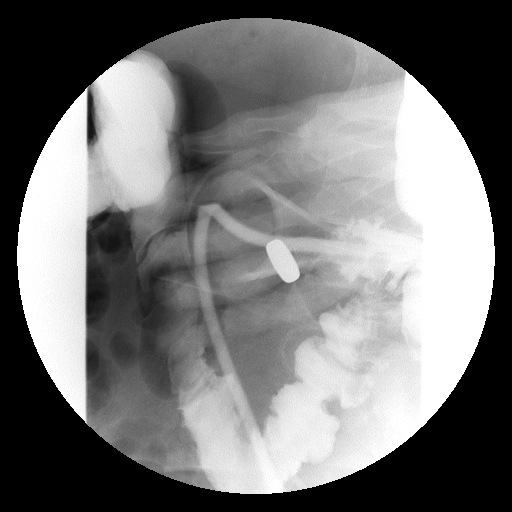

[Series 12: run · 1 of 1 slices shown (11 of 15)]
[im 1/1]
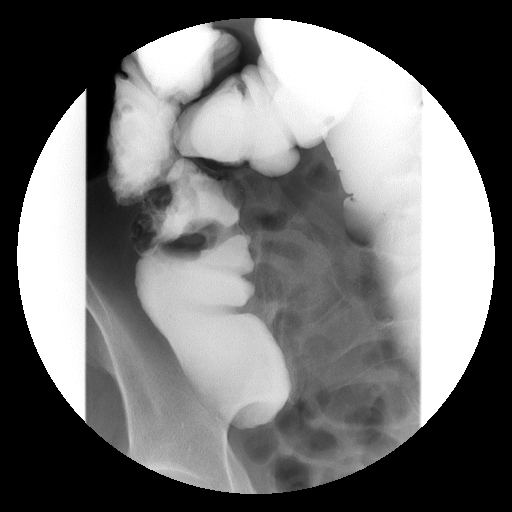

[Series 13: run · 1 of 1 slices shown (12 of 15)]
[im 1/1]
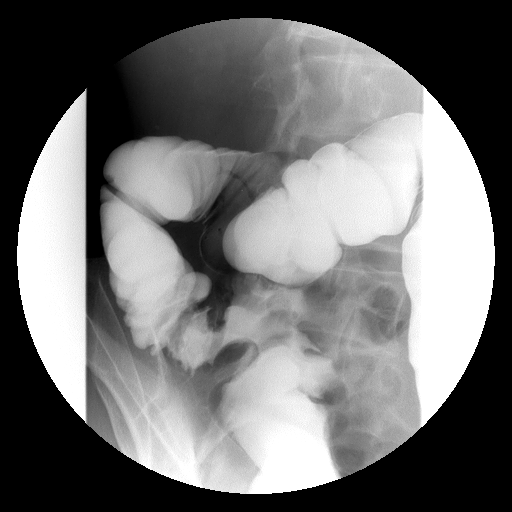

[Series 14: run · 1 of 1 slices shown (13 of 15)]
[im 1/1]
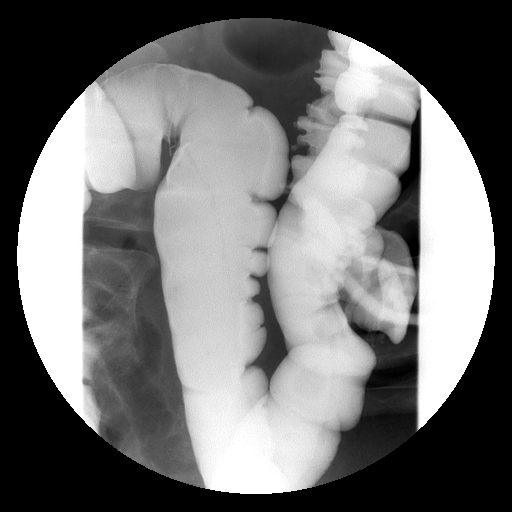

[Series 15: run · 1 of 1 slices shown (14 of 15)]
[im 1/1]
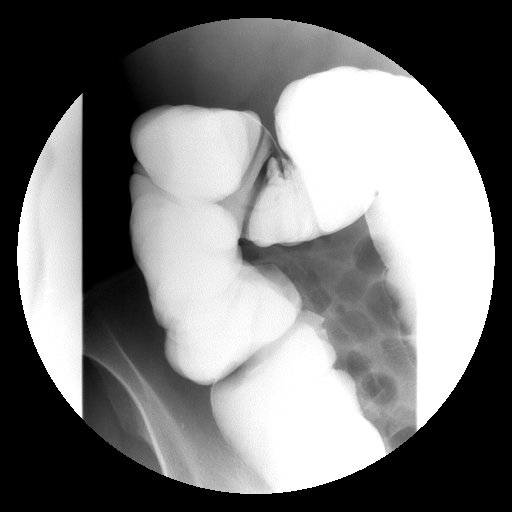

[Series 16: run · 1 of 1 slices shown (15 of 15)]
[im 1/1]
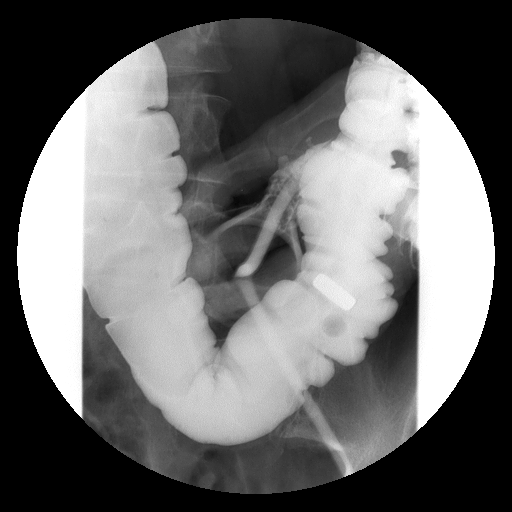

[Series 1001: view not recorded · 0.20mm/px · 1 of 1 slices shown (1 of 2)]
[im 1/1]
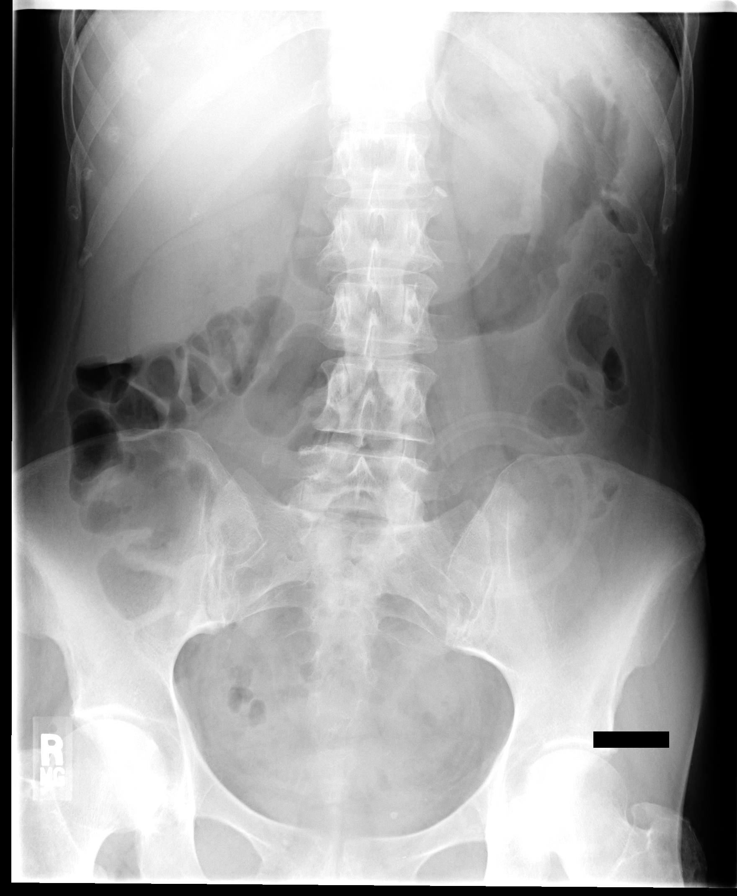

[Series 1002: view not recorded · 0.20mm/px · 1 of 1 slices shown (2 of 2)]
[im 1/1]
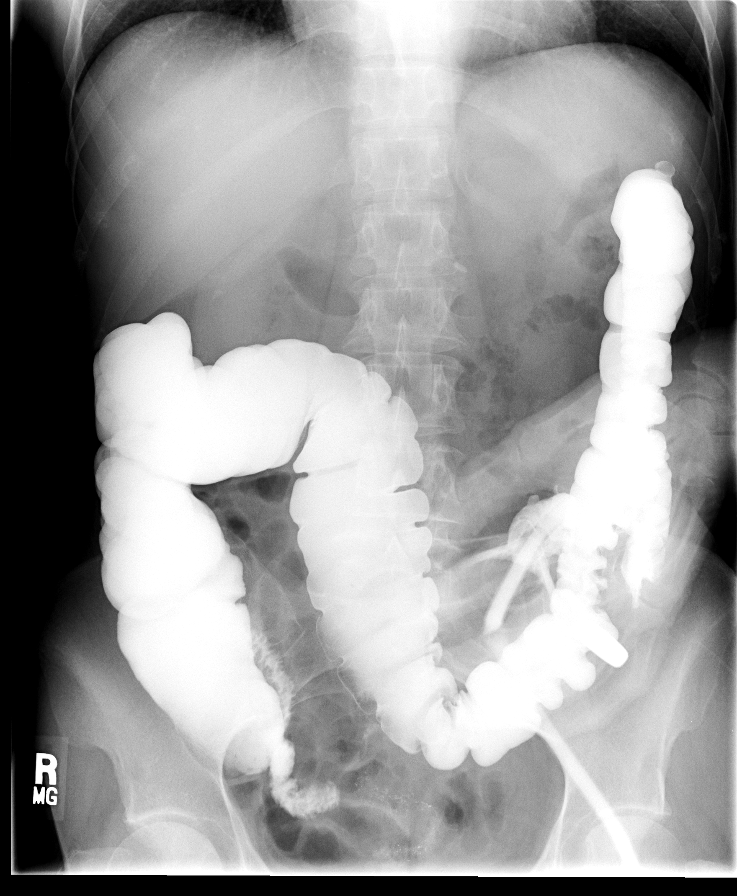

[17 of 18 positions shown; findings below may reference images not displayed]

FINDINGS: Preliminary scout film appears unremarkable.  Colostomy site in the left lower quadrant is noted.  Initially the rectal tube was inserted and the balloon was inflated.  Barium was instilled retrogradely under fluoroscopic control.  The rectum and distal sigmoid appear unremarkable.  Next, the colostomy site was accessed with a cone and catheter.  Contrast was injected retrogradely opacifying the entire colon.  There are diverticula in the descending colon extending down to the ostomy site.  
There is a polypoid defect involving the proximal sigmoid colon not far from the colostomy site.  This appears to be due to adherent stool.  This was less prominent on later views.
IMPRESSION: The patient?s anatomy appears amendable to colostomy takedown and anastomosis.  Note that there are diverticula involving mainly the distal descending and proximal sigmoid colon extending to the colostomy site.

## 2008-04-14 ENCOUNTER — Encounter (INDEPENDENT_AMBULATORY_CARE_PROVIDER_SITE_OTHER): Payer: Self-pay | Admitting: Surgery

## 2008-04-14 ENCOUNTER — Inpatient Hospital Stay (HOSPITAL_COMMUNITY): Admission: RE | Admit: 2008-04-14 | Discharge: 2008-04-19 | Payer: Self-pay | Admitting: Surgery

## 2011-01-29 NOTE — Op Note (Signed)
[name], Frederick                ACCOUNT NO.:  [id number]   MEDICAL RECORD NO.:  [id number]          PATIENT TYPE:  INP   LOCATION:  0004                         FACILITY:  [facility]   PHYSICIAN:  Sandria Bales. Ezzard Standing, M.D.  DATE OF BIRTH:  [birth year]   DATE OF PROCEDURE:  04/14/2008  DATE OF DISCHARGE:                               OPERATIVE REPORT   Dates of admission/discharge ??   PREOPERATIVE DIAGNOSIS:  End-colostomy diverticular disease.  Ventral incisional hernia.   POSTOPERATIVE DIAGNOSES:  1. End-colostomy diverticular disease.  2. Ventral incisional hernia.   PROCEDURES:  1. Bilateral ureteral stents placed by Dr. Lynelle Smoke I. Tannenbaum.  2. Laparoscopic-assisted sigmoid colectomy.  3. Closure of colostomy.  4. Mobilization of splenic flexure.  5. Rigid sigmoidoscopy.  6. Repair of abdominal wall hernias with primary closure of abdomen.   SURGEON:  Dr. Sandria Bales. [facility]   FIRST ASSISTANT:  Dr. Wilmon Arms. [place]   ANESTHESIA:  General endotracheal.   ESTIMATED BLOOD LOSS:  250 mL.   DRAINS:  Drains left in were some Telfa wicks.   INDICATIONS FOR PROCEDURE:  Ms. Frederick is a 60 year old white female who  was hospitalized in September 2008 with a perforated diverticulitis.  She had an end-colostomy.  She now comes for reversal of this colostomy.   Because of the amount of contamination she had the last time, I have  last asked Dr. Jethro Bolus to place bilateral ureteral stents  preoperatively.  I have also talked about the possibility if we got in  there and her pelvis is still heavily scarred, there is a chance we  could not reverse the colostomy.   Potential complications of surgery include bleeding, infection, leakage  of the bowel, injury to the ureter.   She also has some abdominal hernias she pointed out that we will try to  repair at the same time.   OPERATIVE NOTE:  The patient was placed in a supine position, given  general endotracheal anesthetic.   She was placed in lithotomy with both  of her arms tucked.  The first part of the procedure, Dr. Jethro Bolus put bilateral ureteral stents in and did note that her left  ureter was somewhat either stuck or trapped because advancing the stent  was a little bit hard.   I then started my part of the operation.  I prepped her abdomen and  perineum with [employer].  She had PA stockings in place.  She was  given 1 gram of Cefoxin at the initiation of the procedure.  A time-out  was held identifying the patient and the procedure.   I started out laparoscopically.  I placed 3 5 mm trocars, one in the  right upper quadrant, one in the right lower quadrant, and one in the  left lower quadrant.  I was able to laparoscope the patient taking some  of the adhesions down from the anterior abdominal wall.   But, I was having trouble mobilizing the splenic flexure and left colon.  So, therefore, I put in an [employer] hand-assist port through a  lower midline  incision and with this I was able to mobilize the splenic  flexure of the left colon down.   Actually, I thought I had adequate mobilization.  I then took out the  hand-assist port and did the open part of the operation.  My first thing  was I palpated down the pelvis and I did feel what felt like a stump so  the operation was at least doable.  So, I went on and took the colostomy  down.  I made an elliptical incision around the colostomy, took the  colon down.  I took the colon up to the splenic flexure along the left  colonic gutter.   I then dropped this in the abdomen and placed the patient in  Trendelenburg.  I then packed off the small bowel and examined the  pelvis.  She had both, her left ovary particularly, stuck over the  sigmoid colon.  The tip was down.  I think it was down.  I did ding her  left fallopian tube using a Harmonic.  I was able to mobilize up the  sigmoid colon out of the pelvis about 5-6 cm.  So, I  thought I had an  adequate distal margin to do my anastomosis.  I had adequate bowel  length proximally, so I went on to place the right-angle clamp across  the colon.  I resected the distal 3 cm of the colon tip.  I am about 4  cm above the peritoneal reflection.  I then resected another 15-20 cm of  the proximal sigmoid colon and lined these 2 cut ends of the sigmoid  colon.  I then placed inverting sutures using interrupted 2-0 silk  suture in the posterior row until I got around to [place] sutures  anteriorly.  After closure, the anastomosis was about a thumb size of  lumen.  There was no bleeding.   At this point, Dr. Corliss Skains broke scrub.  Using a rigid sigmoidoscope, he  insufflated air into the distal rectum.  I flooded the pelvis with  saline.  There was no evidence of air bubbles.  He passed the  sigmoidoscope to the anastomosis and saw it was about 13 cm from the  anal verge.  The suture line he could see was okay.   He then removed the sigmoidoscope.  I thought the anastomosis showed no  leak, had no evidence of any hematoma or bleeding around it.  I then put  a couple of buttress sutures, one on each side.  I then irrigated the  abdomen with 3 liters of saline, changes gloves and gown.   I then closed the colostomy site using 2 running #1 PDS sutures.  I  closed the midline.  She did have ventral hernia defects.  I tried to  freshen up this hernia sac.  I closed the abdomen with a running #1 PDS  and used some interrupted #1 PDS.   The sponge and needle counts were correct at the end of the case.  I  then closed the skin with skin staples.  I placed some Telfa wicks in  the midline to be removed after 2-3 days.  She tolerated the procedure  well and was transported to the recovery room in good condition.      Sandria Bales. Ezzard Standing, M.D.  Electronically Signed     DHN/MEDQ  D:  04/14/2008  T:  04/14/2008  Job:  [id number]   cc:   Tanya D. Daphine Deutscher, M.D.  Fax: [phone]  Sigmund I. Patsi Sears, M.D.  Fax: [phone]

## 2011-01-29 NOTE — H&P (Signed)
NAMEAKIRA, [name]                ACCOUNT NO.:  [id number]   MEDICAL RECORD NO.:  [id number]          PATIENT TYPE:  EMS   LOCATION:  ED                           FACILITY:  [facility]   PHYSICIAN:  Sandria Bales. Ezzard Standing, M.D.  DATE OF BIRTH:  May 26, 1951   DATE OF ADMISSION:  05/14/2007  DATE OF DISCHARGE:                              HISTORY & PHYSICAL   HISTORY OF PRESENT ILLNESS:  This is a 60 year old white female who  about last Tuesday some nine days ago, on the 19th of August, starting  developing some vague abdominal pain and had sweats every night. She  sort of tolerated this pain/discomfort until last evening when her pain  got bad enough that she came to the [facility] emergency room. She has  no primary medical doctor. She has had no recent physical exam. She  denies a history of peptic ulcer disease, liver disease, pancreatic  disease, colon disease. She has had no prior abdominal surgery. She has  no known history of Crohn's disease. No prior abdominal surgery.   Her mother was diagnosed with colon cancer a couple of years ago, and  her mother, I think, was in her 44s.   Her past medical history:  She has no allergies.   She is on no medications.   REVIEW OF SYSTEMS:  NEUROLOGICAL:  No history of seizure, loss of  consciousness.  LUNGS:  She smokes cigarettes a pack a day. She knows this is bad for  her health but has no lung-related disease she knows about.  CARDIAC:  She has no history of heart disease or chest pain. No cardiac  evaluation.  GASTROINTESTINAL:  See history of present illness.  UROLOGIC:  No kidney stones or kidney injections.   She is accompanied by her husband.  She does not work.   On physical exam, temperature is 99.3, pulse 94, blood pressure 93/60.  She is a thin, white female, alert and cooperative on physical exam.  Her HEENT is unremarkable.  Her neck is supple without masses or thyromegaly.  LUNGS:  Are clear to auscultation with some extra  breath sounds.  Her heart has a regular rate and rhythm without murmur or rub.  Her abdomen is distended. She is tender in her lower abdomen. I feel no  mass. No organomegaly. No hernia. I did not do a rectal exam on her.  EXTREMITIES:  She had good strength in all four extremities.  NEUROLOGICAL:  Is grossly intact.   Her labs show a white blood count of 19,400, a hemoglobin of 13.8,  hematocrit 39.7. Her sodium was 139, potassium 4.1, chloride of 101. Her  total protein was 6.4. Albumin 3.1. Her lipase is 17. Her urinalysis is  unremarkable.   Review of CT scan by phone with Richarda Overlie.   1. She has multiple abscesses what appear to originate from near where      her appendix is, down towards her pelvis/distal sigmoid colon, and      it is unclear whether this originated from appendicitis,      diverticulitis, or some other  source, but she appears to have      multiple abscesses. It does not appear to be amendable to single      percutaneous drainage. I think she is probably served by abdominal      exploration, first starting out laparoscopically with probably an      open laparotomy. I spoke to her and her husband about the      possibility of bowel resection, appendectomy, colon resection, and      the possibility of colostomy, which I already think she has a      pretty bad infection. Risks include bleeding, bowel resection, open      surgery and prolonged hospitalization for these.  2. Does smoke cigarettes, knows it is bad for her health.  3. Family history of colon cancer. She needs a colonoscopy at some      time in her life.  4. She needs a primary care doctor.      Sandria Bales. Ezzard Standing, M.D.  Electronically Signed     DHN/MEDQ  D:  05/14/2007  T:  05/14/2007  Job:  [id number]

## 2011-01-29 NOTE — Op Note (Signed)
NAMEJANDI, [name]                ACCOUNT NO.:  [id number]   MEDICAL RECORD NO.:  [id number]          PATIENT TYPE:  INP   LOCATION:  1535                         FACILITY:  [facility]   PHYSICIAN:  Sandria Bales. Ezzard Standing, M.D.  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   PROCEDURE:  Sigmoid colectomy within sigmoid colostomy.   SURGEON:  Sandria Bales. Ezzard Standing, M.D.   FIRST ASSISTANT:  Adolph Pollack, M.D.   ANESTHESIA:  General endotracheal.   ESTIMATED BLOOD LOSS:  150 mL.   ADDENDUM:  Dr. [place] Service placed bilateral ureteral stents for me  and will dictate that portion of the operation.   INDICATIONS FOR PROCEDURE:  [name] is a 60 year old white female who  has no identified primary medical physician.  I originally saw her on  May 06, 2007 with a pelvic abscess which initially was felt to be  possibly appendiceal.  I did a laparoscopic appendectomy and drainage of  the abscess.  At the time of surgery, I thought it was not her appendix  that caused the abscess but probably a diverticular source of her  abscess.   We have managed her with irrigation of the abscess and home antibiotics.  She was discharged, however, was readmitted on the 12th of December with  a pelvic abscess.  Radiology did a percutaneous drainage of this pelvic  abscess on the 13th of September, 2008, so she has had this drained for  six days now.  Yesterday, I did a fistulogram, which showed a  communication between the abscess cavity and her sigmoid colon.   I thought it would be unlikely this diverticular fistula would seal with  conservative measures.  I thought she would be best served with having a  sigmoid colon resection.   I discussed with her the operation and  potential risks.  The potential risks include but are not limited to  bleeding, the need for colostomy, ureteral injury, or bladder injury.  In trying to prevent any kind of ureteral injury, Dr. [place] Service  will place bilateral ureteral stents for me preoperatively.   OPERATIVE NOTE:  Patient was placed in a lithotomy position.  She was  already on Zosyn as an antibiotic.  Dr. [place] Service placed  bilateral ureteral stents before I started surgery.   They reprepped and draped her with Betadine solution.  Went to a low  midline incision with sharp dissection, carried down to the abdominal  cavity.  Abdominal exploration revealed sort of an inflammation mass in  her pelvis.  The small bowel was more loose here.  I was able to  dissection this out.  She had actually a piece of omentum that was  placed down in the retro-uterine area, and I removed this.  Deep in this  was an abscess which I irrigated and drained.  We sort of beat up the  sigmoid colon.   I could palpate the ureteral stents well lateral to my dissection, but I  thought it would probably be impractical to try to resect her sigmoid  colon safely through a primary anastomosis in this setting.   I therefore first divided the sigmoid colon in the mid sigmoid colon  with an Ethicon 55 mm TIA stapler.  I then took down the mesentery of  the colon with a LigaSure, resecting approximately 20 cm of sigmoid  colon.  I got below the primary inflammatory mass but got to it right at  the peritoneal reflection.  Again, I did not think I could safely  dissect this up to do a primary anastomosis, so I used the contour  stapler, a blue load, to fire at this level.  I then marked the specimen  proximally with a piece of suture and sent this to pathology for  permanent pathology.   I then reinspected the rectal stump.  I placed two 2-0 Prolene in each  corner for later identification.  I  irrigated the pelvis with  approximately 3 liters of saline.  I placed some Surgicel in the pelvis.  The rectal stump laid to the right-hand side of the pelvis, which is a  little bit unusual, in most rectal stumps, they are either directly  posterior or a little bit to the left side.  Her right tubo-ovarian  complex was fairly full and thick, I think again from his chronic  inflammation.   I did go back and look at the cecum, and the appendiceal stump looked  good.  After irrigating the pelvis, I then created an ostotomy of the  left lower quadrant, which had been marked preoperatively, and brought  the sigmoid colon out through this ostomy.   I then tacked the ostotomy up with 3-0 Vicryl sutures.  I closed the  midline suture with 2-0 running PDS sutures, stapled the skin loosely  closed.  I then matured the colostomy using interrupted 3-0 Vicryl  sutures.   Sponge and needle counts were correct at the end of the case.  The  patient tolerated the procedure well.  Again, estimated blood loss was  150-200 cc.  There were no drains left.  I did pull out her pelvic drain  at the beginning of the case, and I pulled up bilateral both ureteral  stents at the end of the case.      Sandria Bales. Ezzard Standing, M.D.  Electronically Signed     DHN/MEDQ  D:  06/05/2007  T:  06/06/2007  Job:  [id number]   cc:   [place] Service, M.D.  Fax: [phone]

## 2011-01-29 NOTE — Op Note (Signed)
[name], [name]                ACCOUNT NO.:  [id number]   MEDICAL RECORD NO.:  [id number]          PATIENT TYPE:  INP   LOCATION:  0004                         FACILITY:  [facility]   PHYSICIAN:  Sigmund I. Patsi Sears, M.D.DATE OF BIRTH:  1950/11/26   DATE OF PROCEDURE:  04/14/2008  DATE OF DISCHARGE:                               OPERATIVE REPORT   PREOPERATIVE DIAGNOSIS:  Status post diverticular abscess with  colostomy.   POSTOPERATIVE DIAGNOSIS:  Status post diverticular abscess with  colostomy.   OPERATIONS:  1. Cystourethroscopy.  2. Bilateral retrograde pyelogram interpretation.  3. Bilateral double-J stents (7-French x 24 cm).   SURGEON:  Sigmund I. Patsi Sears, M.D.   PREPARATION:  After appropriate preanesthesia, the patient was brought  to the operating and placed on the operating room table in the dorsal  supine position; where general endotracheal anesthesia was induced.  She  was then replaced into the dorsal lithotomy position, where the pubis  was prepped with Betadine solution and draped in the usual fashion.   REVIEW OF HISTORY:  This 60 year old female has a history of significant  diverticular abscess formation, and is greater than 6 months post  incision-drainage and colostomy per Dr. Ezzard Standing.  She is now for a  colostomy takedown, and is for retrograde pyelography and left double-J  catheter insertion pre general surgery takedown.   PROCEDURE:  Cystourethroscopy was accomplished, and shows a normal-  appearing bladder and normal urethra.  The left retrograde pyelogram was  performed, which shows a somewhat medially placed and straight ureter.  There was no hydronephrosis noted.  The renal pelvis appears normal and  there was no renal pelvic abnormalities.  A guidewire was placed in the  renal pelvis, and a 7-French x 24 cm double-J stent was placed with  great difficulty.  The stent first had to be replaced because the ureter  was so tight, that it was  difficult to get the stent in position.  However, the stent was manipulated into the renal pelvis, coiled in the  upper pole, as well as in the bladder.  Attention was then directed to  the right side, where right retrograde pyelogram was performed, which  showed a normal-appearing ureter.  The guidewire was placed into the  renal pelvis, and 7-French x 24-cm catheter was coiled  in the renal pelvis, and in the bladder.  This occurred without  difficulty.  The patient had a Foley catheter placed with 5 mL in the balloon.  She  was then reprepped and draped for Dr. Allene Pyo open colostomy takedown  surgery.      Sigmund I. Patsi Sears, M.D.  Electronically Signed     SIT/MEDQ  D:  04/14/2008  T:  04/14/2008  Job:  [id number]   cc:   Sandria Bales. Ezzard Standing, M.D.  [address]  [place]  [place] [address]

## 2011-01-29 NOTE — Discharge Summary (Signed)
NAMEELVIN, Frederick                ACCOUNT NO.:  [id number]   MEDICAL RECORD NO.:  [id number]          PATIENT TYPE:  INP   LOCATION:  1329                         FACILITY:  [facility]   PHYSICIAN:  Sandria Bales. Ezzard Standing, M.D.  DATE OF BIRTH:  09-Jun-1951   DATE OF ADMISSION:  05/14/2007  DATE OF DISCHARGE:  05/19/2007                               DISCHARGE SUMMARY   DISCHARGE DIAGNOSES:  1. Pelvic abscess, felt to be probably secondary to diverticular      disease.  2. Smokes cigarettes.  3. Mother with history of colon cancer.   OPERATIONS PERFORMED:  Patient underwent a laparoscopic appendectomy and  a laparoscopic drainage of pelvic abscess on May 14, 2007.   HISTORY OF ILLNESS:  [name] is a 60 year old white female who has no  identified primary medical doctor (I have dictated an H&P on her.  That  number is [id number].  It is not on the chart at the time of this  dictation).   She had about a one week history of increasing abdominal pain and night  sweats prior to presenting to the hospital.  Within the 24 hours prior  to presentation, her abdominal pain became worse, and she came to the  [facility] emergency room.   A CT scan obtained in the [facility] emergency room showed extensive  inflammation in the pelvis with abscess.  This was felt to be secondary  to either appendicitis from a ruptured appendicitis versus diverticular  disease.   PAST MEDICAL HISTORY:  She again does not have a primary medical doctor.  I discussed with her the importance of having somebody to follow her  chronically.  She smokes about a pack of cigarettes a day and knows it is bad for her  health.  She does have a mother who has had colon cancer.   PHYSICAL EXAMINATION:  Temperature is 99.3, pulse 94.  Her blood  pressure is 93/60.  Her abdomen was distended but had bowel sounds.  She is tender in her  lower abdomen.   I reviewed the CT scan with Dr. Richarda Overlie.  Again, it was unclear  whether  this was of diverticular origin, appendiceal origin, or some  other source.   Her white blood count was 19,400, hemoglobin 13.8.  I discussed with her  the options for treatment, which include either IV antibiotics per the  surgical drain, but I really thought she needed surgical drainage of  this abscess with identification of the source.   She was taken to the operating room on the day of admission where she  underwent a laparoscopic appendectomy.  Her appendix looked grossly  normal.  She did have this pelvic abscess sort of behind the uterus,  which I drained.  I was able to free up her small bowel.  I saw no  evidence of Meckel's.  I really think this is probably diverticular in  origin.   Postoperatively, the patient was kept on IV Zosyn.  Her first postop  day, her hemoglobin was 11, hematocrit 32, white blood count 18,100.  By  the  third postoperative day, her white blood count had come down to  10,200 with a hemoglobin of 10.9.  She is now five days postop.  She has  taken only clear liquids, but she has had a bowel movement.  Her abdomen  is mildly distended but not really tender.  She has active bowel sounds.  Her wounds look good.  I think she is ready for discharge.   Her final pathology on her appendix showed a normal appendix.  I had no  other tissue.  Of note, there is not a typed copy in the chart at the  time of dictation.   Cultures obtained from her pelvis showed a few micro aerophilic strep  and a few gram negative rods.  There are no specific cultural ID's at  the time of dictation and discharge.   Her discharge instructions included eating light for a couple of days.  She can shower.  She should not drive for 2-3 days.  Increase her  activity.  She will be given Vicodin for pain, 20 tablets with 1 refill,  and Augmentin 500 mg 10 tablets for 5 more days of antibiotics.  She  will see me back in 3-4 weeks.  She knows she needs to find a primary  care doctor.   She will need further colonic evaluation, either with  barium enema, colonoscopy, or possibly both, to document her  diverticular disease and with her mother having a family history of  colon cancer.      Sandria Bales. Ezzard Standing, M.D.  Electronically Signed     DHN/MEDQ  D:  05/19/2007  T:  05/19/2007  Job:  [id number]

## 2011-01-29 NOTE — Discharge Summary (Signed)
NAMEMELESSIA, KAUS                ACCOUNT NO.:  [id number]   MEDICAL RECORD NO.:  [id number]          PATIENT TYPE:  INP   LOCATION:  1303                         FACILITY:  [facility]   PHYSICIAN:  Sandria Bales. Ezzard Standing, M.D.  DATE OF BIRTH:  1951-08-24   DATE OF ADMISSION:  04/14/2008  DATE OF DISCHARGE:  04/19/2008                               DISCHARGE SUMMARY   Date of admission/discharge ??   DISCHARGE DIAGNOSIS:  1. Status post end colostomy for perforated diverticulitis.  2. Bilateral ureteral stents.  3. History of smoking.   OPERATIONS PERFORMED:  The patient had placement of bilateral ureteral  stents by Dr. Alexis Frock on April 14, 2008  Then she had a sigmoid colectomy colostomy closure and rigid  sigmoidoscopy by Dr. Ovidio Kin on April 14, 2008.   HISTORY OF ILLNESS:  Ms. Scheibe is a 60 year old white female who sees  Dr. Alwyn Pea as her primary medical doctor.  She presented in  September 2008 with a perforated diverticulitis and required a diverting  colostomy.   She has done well since that time but it looks like she has developed  some small abdominal wall hernias.  She now comes for reversal of this  colostomy.  She had had a barium enema and a preop evaluation which made  it appear she had adequate distal colon for a primary anastomosis.  Because of her significant intra-abdominal contamination during her  first operation I asked Dr. Alexis Frock to see her for placement of  bilateral ureteral stents.   The patient presented to the hospital on April 14, 2008 after completing  an antibiotic and mechanical bowel prep.  Dr. Alexis Frock first  placed bilateral ureteral stents, I then did a laparoscopic-assisted  sigmoid colectomy with closure of colostomy and I went through the  midline incisions and repaired her ventral incisional hernias.   She was placed on Entereg to help kind of prevent postop of ileus.  Her  first postoperative day she did well, she  had some blood in her urine  from the ureteral stent placement, her hemoglobin was 12.4, her white  blood count 12,400, her sodium 37, potassium 4.5, creatinine 0.54.   She was started on clear liquids on the second/third day of  hospitalization.  Her diet has now been advanced to a regular diet.  She  is afebrile.  She is eating.  She has had bowel movements.  Her  incisions look good.  However, because of her history of smoking I am  going to leave her staples in for probably 10-14 days.  I will take out  single staples in her wound.   She is now ready for discharge.  Her pathology showed benign colon with some diverticulosis and some  foreign body giant cell reactions.   She will be given Vicodin for pain.  She has had a little bit of bladder  spasm from the ureteral stent so I will give her some Pyridium to go  home.  She takes vitamins at home.  She knows to quit smoking if possible.  She will see  me back in 7-10  days for wound check and staple remover.      Sandria Bales. Ezzard Standing, M.D.  Electronically Signed     DHN/MEDQ  D:  04/19/2008  T:  04/19/2008  Job:  [id number]   cc:   Tanya D. Daphine Deutscher, M.D.  Fax: [phone]   Sigmund I. Patsi Sears, M.D.  Fax: [phone]

## 2011-01-29 NOTE — Op Note (Signed)
[name], [name]                ACCOUNT NO.:  [id number]   MEDICAL RECORD NO.:  [id number]          PATIENT TYPE:  INP   LOCATION:  1535                         FACILITY:  [facility]   PHYSICIAN:  Boston Service, M.D.DATE OF BIRTH:  August 15, 1951   DATE OF PROCEDURE:  06/05/2007  DATE OF DISCHARGE:                               OPERATIVE REPORT   PREOPERATIVE DIAGNOSIS:  Diverticular abscess.   POSTOPERATIVE DIAGNOSIS:  Diverticular abscess.   PROCEDURE:  Cystoscopy, retrogrades, right and left ureteral stent  placement.   ANESTHESIA:  General.   DRAINS:  18-French Foley, 6-French right ureteral catheter, 6-French  left ureteral catheter.   ESTIMATED BLOOD LOSS:  Minimal.   COMPLICATIONS:  None obvious.   DESCRIPTION OF PROCEDURE:  The patient was prepped and draped in the  dorsal lithotomy position.  A well-lubricated panendoscope was gently  inserted at the urethral meatus.  The bladder was carefully inspected  and showed no obvious evidence of erythema, fistula, or anatomic  deformity.  There was clear reflux, right and left ureteral orifice.  End-hole catheter was then positioned at the right ureteral orifice with  gentle injection of contrast.  The distal two-thirds of the ureter were  clearly outlined, fine and delicate.  Floppy-tip guidewire passed into  what appeared to be a nondilated right renal pelvis.  End-hole catheter  was advanced over the guidewire.  A similar technique was used on the  left side.  Ureteral catheter positioned at the left ureteral orifice  showed a nondilated ureter without obvious evidence of anatomic  deformity.  Guidewire was advanced into the mid pole calyces.  End-hole  catheter was advanced over the guidewire.  Cystoscope was gently  removed.  Ureteral stents were left in place.  An 18-French Foley was  inserted, and the right and left ureteral stents were then sewn to the  Foley catheter using a 2-0 silk tie.  Plan at this point is to  leave  catheter in after the procedure with Dr. Ezzard Standing and Dr. Abbey Chatters.  Ureteral stents can be removed at the end of the procedure.           ______________________________  [place] Service, M.D.     RH/MEDQ  D:  06/09/2007  T:  06/09/2007  Job:  [id number]

## 2011-01-29 NOTE — Op Note (Signed)
[name], [name]                ACCOUNT NO.:  [id number]   MEDICAL RECORD NO.:  [id number]          PATIENT TYPE:  EMS   LOCATION:  ED                           FACILITY:  [facility]   PHYSICIAN:  Sandria Bales. Ezzard Standing, M.D.  DATE OF BIRTH:  [birth year]   DATE OF PROCEDURE:  05/14/2007  DATE OF DISCHARGE:                               OPERATIVE REPORT   PREOPERATIVE DIAGNOSIS:  Multiple pelvic abscesses, appendicitis versus  diverticulitis.   POSTOPERATIVE DIAGNOSIS:  Pelvic abscesses x3, probably secondary to  diverticulitis with normal-appearing appendix.   PROCEDURES:  1. Laparoscopic appendectomy.  2. Laparoscopic drainage of pelvic abscesses.   SURGEON:  Ovidio Kin, MD.   ASSISTANT:  None.   ANESTHESIA:  General endotracheal.   ESTIMATED BLOOD LOSS:  Minimal.   INDICATIONS FOR PROCEDURE:  The patient is a 60 year old white female  who has no identified primary medical physician, who has been sick for  about 9 days. She presented to the [facility] earlier  this morning, was noted to have an elevated white blood count. A CT scan  done, ready by  Dr. Richarda Overlie suggested that the patient has pelvic  abscess. He thought it may originate from her appendix. I reviewed this  with him and to me it is a little bit less clear whether this is from  the appendix or from her colon such as diverticular disease.   She now comes for abdominal laparoscopic exploration. I discussed with  her and her husband the indications, potential complications. I think  she already has an infection ongoing. Potential complications include  bleeding, the necessity of open surgery, bowel resection and the  possibility that she could require colostomy.   PROCEDURE IN DETAIL:  With the patient in the supine position, given a  general endotracheal anesthetic supervised by Dr. Lestine Box. Her  left arm was tucked, her right arm was out to the side. She had a Foley  catheter in place, an  NG tube in place, was already on Unasyn as an IV  antibiotic.   A time-out was held to identify the patient and the procedure. The  abdomen was prepped with Betadine solution and sterilely draped. I  accessed her abdominal cavity with a 12-mm Hasson trocar through an  infraumbilical incision. I placed 3 additional trocars, a 10-mm trocar  in the left lower quadrant, a 5-mm right lower quadrant and a 5-mm right  upper quadrant.   I then carried out abdominal exploration. Right and left lobes of the  liver unremarkable. Gallbladder that I could see was unremarkable. The  stomach was unremarkable. She is actually fairly thin, so I could  actually make out her bowel pretty well. Down in her pelvis though she  had inflammatory mass which was posterior to the uterus. I dissected  through this using blunt dissection and irrigation, and she had at least  a chain of 3 abscesses. Her small bowel, about 10-15 cm below her  terminal ileum was tethered down in the pelvis sealing part of these  abscesses off. I got into the biggest abscess  which was sort of right at  the very base and below the uterus. Obtained cultures for both aerobes  and anaerobes which are pending at the time of this dictation.   I was able to free the loop of small bowel out of the pelvis. She has  some diverticulosis but there was no clear leakage of either stool, or  no clear hole that I could identify. It appeared like this was just a  well walled-off abscess. I do not think it was secondary to any kind of  tubular structure or GYN, her right tube was badly inflamed, but I think  it was just caught up in this mass. I did identify her appendix which  looked totally normal, but I went on and removed her appendix. I took  out the mesentery with a harmonic scalpel. I used a vascular load of the  Ethicon Endo GI 45-mm stapler across the base of the appendix. I  delivered the appendix through an Endo-catch bag.   I then ran the  small bowel back at least 3 or 4 feet making sure there  is no evidence of a Meckel's which I did not see. The segment of small  bowel which had been attached to the abscess was badly beat up, but I do  not see evidence of perforation, nor did I think this was the source of  her infection. She had no evidence of Crohn or colitis, at least in the  cecum or terminal ileum that I could see. I really this is probably  diverticular in nature, though I could not identify any perforation.   Since I thought I had irrigated her abscesses well, abscesses were  actually fairly limited to her pelvis, I thought it was worthwhile  trying not doing any further surgery. I will keep her NPO for at least 3-  5 days and see if this will resolve without surgical exploration.   I talked to her and her husband, that if this gets worse she will need  to be explored with a sigmoid colectomy and diversion.      Sandria Bales. Ezzard Standing, M.D.  Electronically Signed     DHN/MEDQ  D:  05/14/2007  T:  05/14/2007  Job:  [id number]

## 2011-01-29 NOTE — H&P (Signed)
NAMEEVANGELYNE, LOJA                ACCOUNT NO.:  [id number]   MEDICAL RECORD NO.:  [id number]          PATIENT TYPE:  INP   LOCATION:  1535                         FACILITY:  [facility]   PHYSICIAN:  Sandria Bales. Ezzard Standing, M.D.  DATE OF BIRTH:  December 01, 1950   DATE OF ADMISSION:  05/29/2007  DATE OF DISCHARGE:                              HISTORY & PHYSICAL   HISTORY OF PRESENT ILLNESS:  Ms. Kitner is a 60 year old white female  who has no primary medical physician.  She presented to [facility]  emergency room on the 20th of August 2008 with an approximately nine-day  history of vague abdominal pain and night sweats every evening.   She had a CT scan, which suggested a pelvic abscess and was felt to be  possibly due to appendicitis.   She was taken to the operating room by Dr. Ovidio Kin on the 28th of  August 28 where she underwent a laparoscopic appendectomy and drainage  of her pelvic abscess.  However, her appendix appeared normal at the  time of laparoscopy, and it was felt that her pelvic abscess was  probably due to diverticular disease.  There was no obvious hole in the  colon nor obvious fistula, and therefore an attempt was made to drain  this abscess and put her on oral antibiotics.   She was kept in the hospital about 3-4 days before being discharged home  on antibiotics and did well until about the 11th of September when she  started having night fevers.  We obtained a white blood count on her on  the 12th of September, which showed a white blood count of about 21,000,  and a CT scan showed a recurrent pelvic abscess.   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   PAST MEDICAL HISTORY:  She has no allergies.   Other than the antibiotics I had her on, she had been on no chronic  medications.   REVIEW OF SYSTEMS:  NEUROLOGIC:  No seizure or loss of  consciousness.  PULMONARY:  She smokes a pack of cigarettes a day, knows this is bad for  her health.  Has no other lung-related diseases.  CARDIAC:  She has had no history of heart disease, chest pain or cardiac  catheterization.  GASTROINTESTINAL:  She did not know of any diverticular disease before  this episode.  She has had no history of peptic ulcer disease, liver  disease, or prior abdominal surgery.  ROLOGIC:  No kidney stones or kidney infections.   She does not work.  She is accompanied by her husband on admission.   ADMISSION PHYSICAL EXAMINATION:  VITAL SIGNS:  Temperature is 98.2,  pulse 85, respirations 20, blood pressure 105/56.  GENERAL:  She is a well-nourished, pleasant white female, alert and  cooperative on physical exam.  HEENT:  Unremarkable.  NECK:  Supple without mass or thyromegaly.  LUNGS:  Clear to auscultation.  HEART:  Regular rate and rhythm without murmur or rub.  ABDOMEN:  Mildly distended.  She has a little bit of soreness in her  suprapubic area but has minimal abdominal findings or peritoneal signs.  Her incisions all have healed nicely.  She has active bowel sounds.  She  has no guarding, no rebound, no palpable mass.  EXTREMITIES:  She has brisk refill in all four extremities.  NEUROLOGIC:  Grossly intact.   ADMISSION IMPRESSION:  Recurrent diverticular abscess.   PLAN:  IV antibiotics, n.p.o., discuss with radiology regarding  percutaneous drainage, which will be done on Saturday the 13th of  September 2008.   I talked to her and her husband at length about what could happen from  here, if this percutaneous drainage is successful and I can stabilize  this abscess, the best thing would be to have her an elective resection  in 6-8 weeks after this is all drained.  If, however, she continues to  run fever, continues to get recurrent abscesses, or this does not  improve her symptoms, she could potentially need an exploration at this  time which  would very possibly lead to a colostomy.   I think she understands all the pros and cons of all this.      Sandria Bales. Ezzard Standing, M.D.  Electronically Signed     DHN/MEDQ  D:  05/30/2007  T:  05/30/2007  Job:  [id number]

## 2011-02-01 NOTE — Discharge Summary (Signed)
NAMECLIFFORD, [name]                ACCOUNT NO.:  [id number]   MEDICAL RECORD NO.:  [id number]          PATIENT TYPE:  INP   LOCATION:  1535                         FACILITY:  [facility]   PHYSICIAN:  Sandria Bales. Ezzard Standing, M.D.  DATE OF BIRTH:  Jul 13, 1951   DATE OF ADMISSION:  05/29/2007  DATE OF DISCHARGE:  06/12/2007                               DISCHARGE SUMMARY   DISCHARGE DIAGNOSES:  1. Perforated sigmoid colon diverticulitis.  2. Pelvic abscess.  3. Smokes cigarettes.   PROCEDURE:  The patient had a percutaneous drainage of pelvic abscess on  May 30, 2007.  She had sigmoid colectomy with end-sigmoid colostomy, Hartmann's pouch,  and bilateral ureteral stents on June 05, 2007.   HISTORY OF PRESENT ILLNESS:  The patient is a 60 year old white female  who has no primary medical doctor.  She presented to [facility] emergency room on May 06, 2007, with a 9-day  history of vague abdominal pain.  By CT scan suggested a pelvic abscess  felt to be possibly due to appendicitis.  She underwent a laparoscopic  exploration where Dr. Ezzard Standing did a laparoscopic appendectomy on her on  August 20, but her appendix actually was normal and she had a pelvic  abscess which was felt to be probably secondary to diverticular disease.  This abscess was drained laparoscopically and irrigated.  She was kept  in the hospital about 3-4 days and sent home on antibiotics and did well  until September 11, when she started having night sweats and fevers.  She represented to the hospital where white blood cell count was 21,000  and a CT scan showed a recurrent pelvic abscess.   ALLERGIES:  No known drug allergies.   SOCIAL HISTORY:  She smokes cigarettes and knows this is bad for her  health, but this is really the only medical complaint.   PHYSICAL EXAMINATION:  VITAL SIGNS:  Temperature 98.2, pulse 85,  respirations 20, blood pressure 105/56.  ABDOMEN:  Shows some mild  distention with a little bit of soreness, but  surprisingly despite her findings by CT scan, she had a fairly  unremarkable physical examination.   HOSPITAL COURSE:  She was admitted with a white blood cell count of  21,000 on September 12.  I discussed with her the options of treatment  and the first step would be to try percutaneously drain this abscess and  see if she could get away without urgent surgery.  On September 13, she  underwent a drainage of a pelvic abscess by invasive radiology, Arn Medal, M.D.   She got better with the pelvis abscess drained.  On September 14, white  blood cell count was down to 12,200.  She was sore at the drainage site  which came out her right buttocks, but otherwise was doing fairly well.  However, by the third day, it looked like some fecal material was coming  out of the drain, and so I obtained a fistulogram on her on September  18, which was the fifth day of drainage and this showed a connection to  her colon.  I discussed with her the options for treatment and I thought  it would be very unlikely that this fistula track would heal in the face  of recurrent abscess and infection.  The options were to continue  drainage, versus abdominal surgery for resection.   I explained to her the possibility of colostomy and she agreed to go  ahead with surgery.  Because of the amount of inflammation in her  pelvis, I asked Dr. Wanda Plump to put in bilateral ureteral stents and on  September 19, she had bilateral ureteral stents placed by Dr. Wanda Plump  and I did a sigmoid colectomy, but was unable to do a primary  anastomosis because of the amount of inflammation and did an end-sigmoid  colostomy.   She did well postoperatively.  On the third postoperative day, September  22, her hemoglobin was 9, her white blood cell count 8600, electrolytes  normal, and she was afebrile.  She was started on liquids on the fourth  postoperative day and advanced to a  diet.  By the 7th postoperative day  she was afebrile, she was tolerating a diet, her colostomy was working,  she had seven days of Zosyn postoperatively, and she was ready for  discharge.   DISCHARGE INSTRUCTIONS:  Regular diet, she can shower, she should not  drive for 4-5 days.  She will see me back in 2-3 weeks in the office.  She was given Vicodin for pain.   FINAL PATHOLOGY:  Sigmoid colon with peridiverticular inflammation,  diverticulosis, and abscess.  No evidence of malignancy.   CONDITION ON DISCHARGE:  Good.      Sandria Bales. Ezzard Standing, M.D.  Electronically Signed     DHN/MEDQ  D:  06/23/2007  T:  06/23/2007  Job:  [id number]   cc:   [place] [employer], M.D.  Fax: [phone]

## 2011-06-14 LAB — CBC
HCT: 36.4
Hemoglobin: 12.4
MCHC: 33.9
MCHC: 34
MCV: 96.4
MCV: 98.7
Platelets: 165
RBC: 3.69 — ABNORMAL LOW

## 2011-06-14 LAB — BASIC METABOLIC PANEL
CO2: 27
Chloride: 107
Creatinine, Ser: 0.54
GFR calc Af Amer: >60
Potassium: 4.5

## 2011-06-14 LAB — COMPREHENSIVE METABOLIC PANEL
AST: 23
Albumin: 4
Calcium: 9.5
Creatinine, Ser: 0.49
GFR calc Af Amer: >60
GFR calc non Af Amer: >60

## 2011-06-14 LAB — DIFFERENTIAL
Basophils Relative: 0
Eosinophils Absolute: 0
Eosinophils Relative: 0
Eosinophils Relative: 0
Lymphocytes Relative: 21
Lymphs Abs: 1.7
Monocytes Absolute: 0.7
Monocytes Relative: 6
Neutrophils Relative %: 85 — ABNORMAL HIGH

## 2011-06-27 LAB — BASIC METABOLIC PANEL
BUN: 1 — ABNORMAL LOW
BUN: <1 — ABNORMAL LOW
BUN: <1 — ABNORMAL LOW
Calcium: 8 — ABNORMAL LOW
Calcium: 8.5
Creatinine, Ser: 0.64
GFR calc non Af Amer: >60
GFR calc non Af Amer: >60
GFR calc non Af Amer: >60
Glucose, Bld: 107 — ABNORMAL HIGH
Glucose, Bld: 89
Potassium: 3.7
Potassium: 4
Potassium: 4.1
Sodium: 137

## 2011-06-27 LAB — DIFFERENTIAL
Lymphocytes Relative: 31
Lymphs Abs: 2
Lymphs Abs: 2
Monocytes Absolute: 0.4
Monocytes Relative: 7
Monocytes Relative: 7
Neutro Abs: 3.8
Neutro Abs: 7
Neutrophils Relative %: 69

## 2011-06-27 LAB — CBC
HCT: 27.3 — ABNORMAL LOW
HCT: 31.6 — ABNORMAL LOW
HCT: 32.3 — ABNORMAL LOW
HCT: 33.2 — ABNORMAL LOW
Hemoglobin: 10.8 — ABNORMAL LOW
MCHC: 34.3
MCHC: 34.3
MCV: 90.7
MCV: 91.5
Platelets: 280
Platelets: 315
Platelets: 318
Platelets: 340
Platelets: 350
RBC: 3.4 — ABNORMAL LOW
RDW: 12.6
RDW: 13
RDW: 13
RDW: 13.2
WBC: 10.1
WBC: 6.6
WBC: 8.6

## 2011-06-27 LAB — TYPE AND SCREEN
ABO/RH(D): A POS
Antibody Screen: NEGATIVE

## 2011-06-28 LAB — DIFFERENTIAL
Basophils Absolute: 0
Basophils Relative: 0
Eosinophils Absolute: 0
Eosinophils Relative: 0
Eosinophils Relative: 0
Lymphocytes Relative: 11 — ABNORMAL LOW
Lymphs Abs: 1.8
Monocytes Absolute: 0.9 — ABNORMAL HIGH
Monocytes Absolute: 1.1 — ABNORMAL HIGH
Neutro Abs: 15.1 — ABNORMAL HIGH

## 2011-06-28 LAB — URINALYSIS, ROUTINE W REFLEX MICROSCOPIC
Bilirubin Urine: NEGATIVE
Glucose, UA: NEGATIVE
Hgb urine dipstick: NEGATIVE
Ketones, ur: 40 — AB
Nitrite: NEGATIVE
Specific Gravity, Urine: 1.018
pH: 7.5

## 2011-06-28 LAB — URINE MICROSCOPIC-ADD ON

## 2011-06-28 LAB — BASIC METABOLIC PANEL
BUN: 8
BUN: 1 — ABNORMAL LOW
CO2: 28
Calcium: 7.8 — ABNORMAL LOW
Calcium: 7.9 — ABNORMAL LOW
Chloride: 94 — ABNORMAL LOW
Chloride: 102
Creatinine, Ser: 0.51
Creatinine, Ser: 0.57
GFR calc Af Amer: >60
GFR calc Af Amer: >60
GFR calc Af Amer: >60
Potassium: 4.1
Sodium: 135

## 2011-06-28 LAB — CBC
HCT: 33.5 — ABNORMAL LOW
HCT: 31.5 — ABNORMAL LOW
HCT: 32 — ABNORMAL LOW
Hemoglobin: 11.7 — ABNORMAL LOW
Hemoglobin: 10.9 — ABNORMAL LOW
MCHC: 34.4
MCHC: 34.2
MCHC: 34.7
MCHC: 34.8
MCV: 91
MCV: 92.8
Platelets: 280
Platelets: 284
Platelets: 293
Platelets: 373
RBC: 3.68 — ABNORMAL LOW
RBC: 3.51 — ABNORMAL LOW
RBC: 4.27
RDW: 12.7
RDW: 12.5
RDW: 12.6
WBC: 18.1 — ABNORMAL HIGH
WBC: 10.2
WBC: 18.1 — ABNORMAL HIGH
WBC: 19.4 — ABNORMAL HIGH

## 2011-06-28 LAB — BODY FLUID CULTURE

## 2011-06-28 LAB — ANAEROBIC CULTURE

## 2011-06-28 LAB — CROSSMATCH
ABO/RH(D): A POS
Antibody Screen: NEGATIVE

## 2011-06-28 LAB — LIPASE, BLOOD: Lipase: 17

## 2011-06-28 LAB — CULTURE, ROUTINE-ABSCESS

## 2011-06-28 LAB — COMPREHENSIVE METABOLIC PANEL
ALT: 14
AST: 16
Albumin: 3.1 — ABNORMAL LOW
CO2: 27
Calcium: 8.8
Chloride: 101
GFR calc Af Amer: >60
GFR calc non Af Amer: >60
Sodium: 139

## 2011-06-28 LAB — ABO/RH: ABO/RH(D): A POS

## 2017-09-15 ENCOUNTER — Other Ambulatory Visit: Payer: Self-pay

## 2017-09-15 ENCOUNTER — Ambulatory Visit: Payer: Medicare Other | Admitting: Physician Assistant

## 2017-09-15 ENCOUNTER — Encounter: Payer: Self-pay | Admitting: Physician Assistant

## 2017-09-15 VITALS — BP 114/60 | HR 75 | Temp 98.7°F | Resp 18 | Ht 64.0 in | Wt 113.4 lb

## 2017-09-15 DIAGNOSIS — F17200 Nicotine dependence, unspecified, uncomplicated: Secondary | ICD-10-CM

## 2017-09-15 DIAGNOSIS — J209 Acute bronchitis, unspecified: Secondary | ICD-10-CM

## 2017-09-15 DIAGNOSIS — Z972 Presence of dental prosthetic device (complete) (partial): Secondary | ICD-10-CM | POA: Diagnosis not present

## 2017-09-15 DIAGNOSIS — K08409 Partial loss of teeth, unspecified cause, unspecified class: Secondary | ICD-10-CM | POA: Diagnosis not present

## 2017-09-15 DIAGNOSIS — F17210 Nicotine dependence, cigarettes, uncomplicated: Secondary | ICD-10-CM | POA: Insufficient documentation

## 2017-09-15 MED ORDER — AZITHROMYCIN 500 MG PO TABS: 500.0000 mg | ORAL_TABLET | Freq: Every day | ORAL | 0 refills | Status: DC

## 2017-09-15 MED ORDER — GUAIFENESIN ER 1200 MG PO TB12: 1.0000 | ORAL_TABLET | Freq: Two times a day (BID) | ORAL | 1 refills | Status: DC | PRN

## 2017-09-15 MED ORDER — AZELASTINE HCL 0.1 % NA SOLN: 2.0000 | Freq: Two times a day (BID) | NASAL | 0 refills | Status: DC

## 2017-09-15 MED ORDER — BENZONATATE 100 MG PO CAPS: 100.0000 mg | ORAL_CAPSULE | Freq: Three times a day (TID) | ORAL | 0 refills | Status: DC | PRN

## 2017-09-15 NOTE — Progress Notes (Signed)
Patient ID: [name], female     DOB: [birth year], 66 y.o.    MRN: [id number]  PCP: [name], PA-C  Chief Complaint  Patient presents with  . Cough    x5 days, Pt has been taking Mucous Relief and Wal-tussin. Pt states she feels SOB when she coughs. Pt states her chest doesn't feel heavy. Pt states she is coughing up clear mucous.  . Chest Congestion    Subjective:   This patient is new to me and presents for evaluation of cough and chest congestion.  She is accompanied by her husband, Juanda Crumble, who recently established with me for primary care.  She desires to do so as well.  She notes she has not received any routine health care since 2008/12/13 when their daughter died.  "I just did not care."  Was in her usual state of health until just over a week ago. Was getting ready for the holiday. Suddenly developed a sore throat, it felt like it was on fire and radiated up into the ears. That last several days, and then she developed cough., Started OTC cough product 09/08/2017, which was not helpful. She has used OTC mucous relief product since 09/10/2017.  Sore throat has resolved. No recurrent ear pain. Stuffy and runny nose. Some sinus pressure. Lots of post-nasal drainage. Often has more coming out her nose with cough than from the chest.  No feer, chills. No nausea, vomiting. Did have several days of constipation, now resolved with Colace. No unexplained muscle or joint pain.  Smoking 2 cigarettes/day with this illness, down from 1/2-3/4 ppd normally.  She has been a smoker for 40 years.  Review of Systems As above. Despite positive despite depression screen she reports she is not depressed.  Her symptoms have really only been present for the last several days and are associated with her current illness.  Depression screen PHQ 2/9 09/15/2017  Decreased Interest 1  Down, Depressed, Hopeless 1  PHQ - 2 Score 2  Altered sleeping 2  Tired, decreased energy 1    Change in appetite 0  Feeling bad or failure about yourself  0  Trouble concentrating 1  Moving slowly or fidgety/restless 1  Suicidal thoughts 0  PHQ-9 Score 7  Difficult doing work/chores Somewhat difficult     Prior to Admission medications   Medication Sig Start Date End Date Taking? Authorizing Provider  Dextromethorphan-Guaifenesin (MUCUS RELIEF DM COUGH PO) Take by mouth.   Yes [provider]  guaifenesin (ROBITUSSIN) 100 MG/5ML syrup Take 200 mg by mouth 3 (three) times daily as needed for cough.   Yes [provider]     No Known Allergies   There are no active problems to display for this patient.    Family History  Problem Relation Age of Onset  . Mental illness Mother   . Cancer Father      Social History   Socioeconomic History  . Marital status: Married    Spouse name: Not on file  . Number of children: Not on file  . Years of education: Not on file  . Highest education level: Not on file  Social Needs  . Financial resource strain: Not on file  . Food insecurity - worry: Not on file  . Food insecurity - inability: Not on file  . Transportation needs - medical: Not on file  . Transportation needs - non-medical: Not on file  Occupational History  . Not on file  Tobacco Use  .  Smoking status: Current Every Day Smoker  . Smokeless tobacco: Never Used  Substance and Sexual Activity  . Alcohol use: Yes    Alcohol/week: 12.0 oz    Types: 20 Standard drinks or equivalent per week  . Drug use: No  . Sexual activity: Not on file  Other Topics Concern  . Not on file  Social History Narrative  . Not on file         Objective:  Physical Exam  Constitutional: She is oriented to person, place, and time. She appears well-developed and well-nourished. She is active and cooperative. No distress.  BP 114/60 (BP Location: Left Arm, Patient Position: Sitting, Cuff Size: Normal)   Pulse 75   Temp 98.7 F (37.1 C) (Oral)   Resp 18    Ht 5\' 4"  (1.626 m)   Wt 113 lb 6.4 oz (51.4 kg)   SpO2 91%   BMI 19.47 kg/m   HENT:  Head: Normocephalic and atraumatic.  Right Ear: Hearing, tympanic membrane, external ear and ear canal normal.  Left Ear: Hearing, tympanic membrane, external ear and ear canal normal.  Nose: Rhinorrhea present. No mucosal edema. Right sinus exhibits frontal sinus tenderness (mild). Right sinus exhibits no maxillary sinus tenderness. Left sinus exhibits no maxillary sinus tenderness and no frontal sinus tenderness.  Mouth/Throat: Oropharynx is clear and moist and mucous membranes are normal. She has dentures (upper plate). Abnormal dentition. No oropharyngeal exudate.  Eyes: Conjunctivae are normal. No scleral icterus.  Neck: Normal range of motion, full passive range of motion without pain and phonation normal. Neck supple. No thyromegaly present.  Cardiovascular: Normal rate, regular rhythm and normal heart sounds.  Pulses:      Radial pulses are 2+ on the right side, and 2+ on the left side.  Pulmonary/Chest: Effort normal. She has wheezes (soft, scattered, high-pitched).  Lymphadenopathy:       Head (right side): No tonsillar, no preauricular, no posterior auricular and no occipital adenopathy present.       Head (left side): No tonsillar, no preauricular, no posterior auricular and no occipital adenopathy present.    She has no cervical adenopathy.       Right: No supraclavicular adenopathy present.       Left: No supraclavicular adenopathy present.  Neurological: She is alert and oriented to person, place, and time. No sensory deficit.  Skin: Skin is warm, dry and intact. No rash noted. No cyanosis or erythema. Nails show no clubbing.  Psychiatric: She has a normal mood and affect. Her speech is normal and behavior is normal.        Assessment & Plan:  1. Acute bronchitis, unspecified organism She is at higher risk for bacterial cause due to her smoking history.  Counseled on the need for  smoking cessation. - azelastine (ASTELIN) 0.1 % nasal spray; Place 2 sprays into both nostrils 2 (two) times daily. Use in each nostril as directed  Dispense: 30 mL; Refill: 0 - benzonatate (TESSALON) 100 MG capsule; Take 1-2 capsules (100-200 mg total) by mouth 3 (three) times daily as needed for cough.  Dispense: 40 capsule; Refill: 0 - Guaifenesin (MUCINEX MAXIMUM STRENGTH) 1200 MG TB12; Take 1 tablet (1,200 mg total) by mouth every 12 (twelve) hours as needed.  Dispense: 14 tablet; Refill: 1 - azithromycin (ZITHROMAX) 500 MG tablet; Take 1 tablet (500 mg total) by mouth daily.  Dispense: 3 tablet; Refill: 0  2. Wears partial dentures  3. Smoker Encourage smoking cessation.    Return if  symptoms worsen or fail to improve, for Annual Wellness Visit with Dewitt Hoes, LPN.   Fara Chute, PA-C [facility]

## 2017-09-15 NOTE — Patient Instructions (Addendum)
  We recommend that you schedule a mammogram for breast cancer screening. Typically, you do not need a referral to do this. Please contact a local imaging center to schedule your mammogram.  [facility] - [phone]  *ask for the Radiology Department [facility] ([place]) - [phone] or [phone]  [facility] - [phone] [place] [phone] [facility] - [phone]  *ask for the [facility] - [phone]  *ask for the Radiology Department [facility] - [phone]  *ask for the [facility] - [phone]   IF you received an x-ray today, you will receive an invoice from [facility] Radiology. Please contact [facility] Radiology at [phone] with questions or concerns regarding your invoice.   IF you received labwork today, you will receive an invoice from [facility]. Please contact [facility] at [phone] with questions or concerns regarding your invoice.   Our billing staff will not be able to assist you with questions regarding bills from these companies.  You will be contacted with the lab results as soon as they are available. The fastest way to get your results is to activate your My Chart account. Instructions are located on the last page of this paperwork. If you have not heard from [place] regarding the results in 2 weeks, please contact this office.    Did you know that you begin to benefit from quitting smoking within the first twenty minutes? It's TRUE.  At 20 minutes: -blood pressure decreases -pulse rate drops -body temperature of hands and feet increases  At 8 hours: -carbon monoxide level in blood drops to normal -oxygen level in blood increases to normal  At 24 hours: -the chance of heart attack decreases  At 48 hours: -nerve endings start regrowing -ability to smell and taste is enhanced  2  weeks-3 months: -circulation improves -walking becomes easier -lung function improves  1-9 months: -coughing, sinus congestion, fatigue and shortness of breath decreases  1 year: -excess risk of heart disease is decreased to HALF that of a smoker  5 years: Stroke risk is reduced to that of people who have never smoked  10 years: -risk of lung cancer drops to as little as half that of continuing smokers -risk of cancer of the mouth, throat, esophagus, bladder, kidney and pancreas decreases -risk of ulcer decreases  15 years -risk of heart disease is now similar to that of people who have never smoked -risk of death returns to nearly the level of people who have never smoked

## 2017-10-29 ENCOUNTER — Encounter: Payer: Self-pay | Admitting: Physician Assistant

## 2017-10-29 ENCOUNTER — Ambulatory Visit: Payer: Medicare Other | Admitting: Physician Assistant

## 2017-10-29 ENCOUNTER — Ambulatory Visit (INDEPENDENT_AMBULATORY_CARE_PROVIDER_SITE_OTHER): Payer: Medicare Other

## 2017-10-29 VITALS — BP 102/54 | HR 89 | Temp 99.7°F | Resp 18 | Ht 64.0 in | Wt 114.6 lb

## 2017-10-29 DIAGNOSIS — Z1231 Encounter for screening mammogram for malignant neoplasm of breast: Secondary | ICD-10-CM | POA: Diagnosis not present

## 2017-10-29 DIAGNOSIS — Z1329 Encounter for screening for other suspected endocrine disorder: Secondary | ICD-10-CM

## 2017-10-29 DIAGNOSIS — J209 Acute bronchitis, unspecified: Secondary | ICD-10-CM | POA: Diagnosis not present

## 2017-10-29 DIAGNOSIS — F17200 Nicotine dependence, unspecified, uncomplicated: Secondary | ICD-10-CM | POA: Diagnosis not present

## 2017-10-29 DIAGNOSIS — Z1159 Encounter for screening for other viral diseases: Secondary | ICD-10-CM | POA: Diagnosis not present

## 2017-10-29 DIAGNOSIS — Z13220 Encounter for screening for lipoid disorders: Secondary | ICD-10-CM | POA: Diagnosis not present

## 2017-10-29 DIAGNOSIS — Z13228 Encounter for screening for other metabolic disorders: Secondary | ICD-10-CM | POA: Diagnosis not present

## 2017-10-29 DIAGNOSIS — E2839 Other primary ovarian failure: Secondary | ICD-10-CM

## 2017-10-29 DIAGNOSIS — Z1211 Encounter for screening for malignant neoplasm of colon: Secondary | ICD-10-CM

## 2017-10-29 IMAGING — DX DG CHEST 2V
2 series · 2 of 2 positions shown · non-contrast
Comparison: Chest x-ray of [DATE]

CLINICAL DATA: Cough, wheezing, longstanding smoker.  Suspect COPD.

EXAM:
CHEST  2 VIEW

[chest pa]
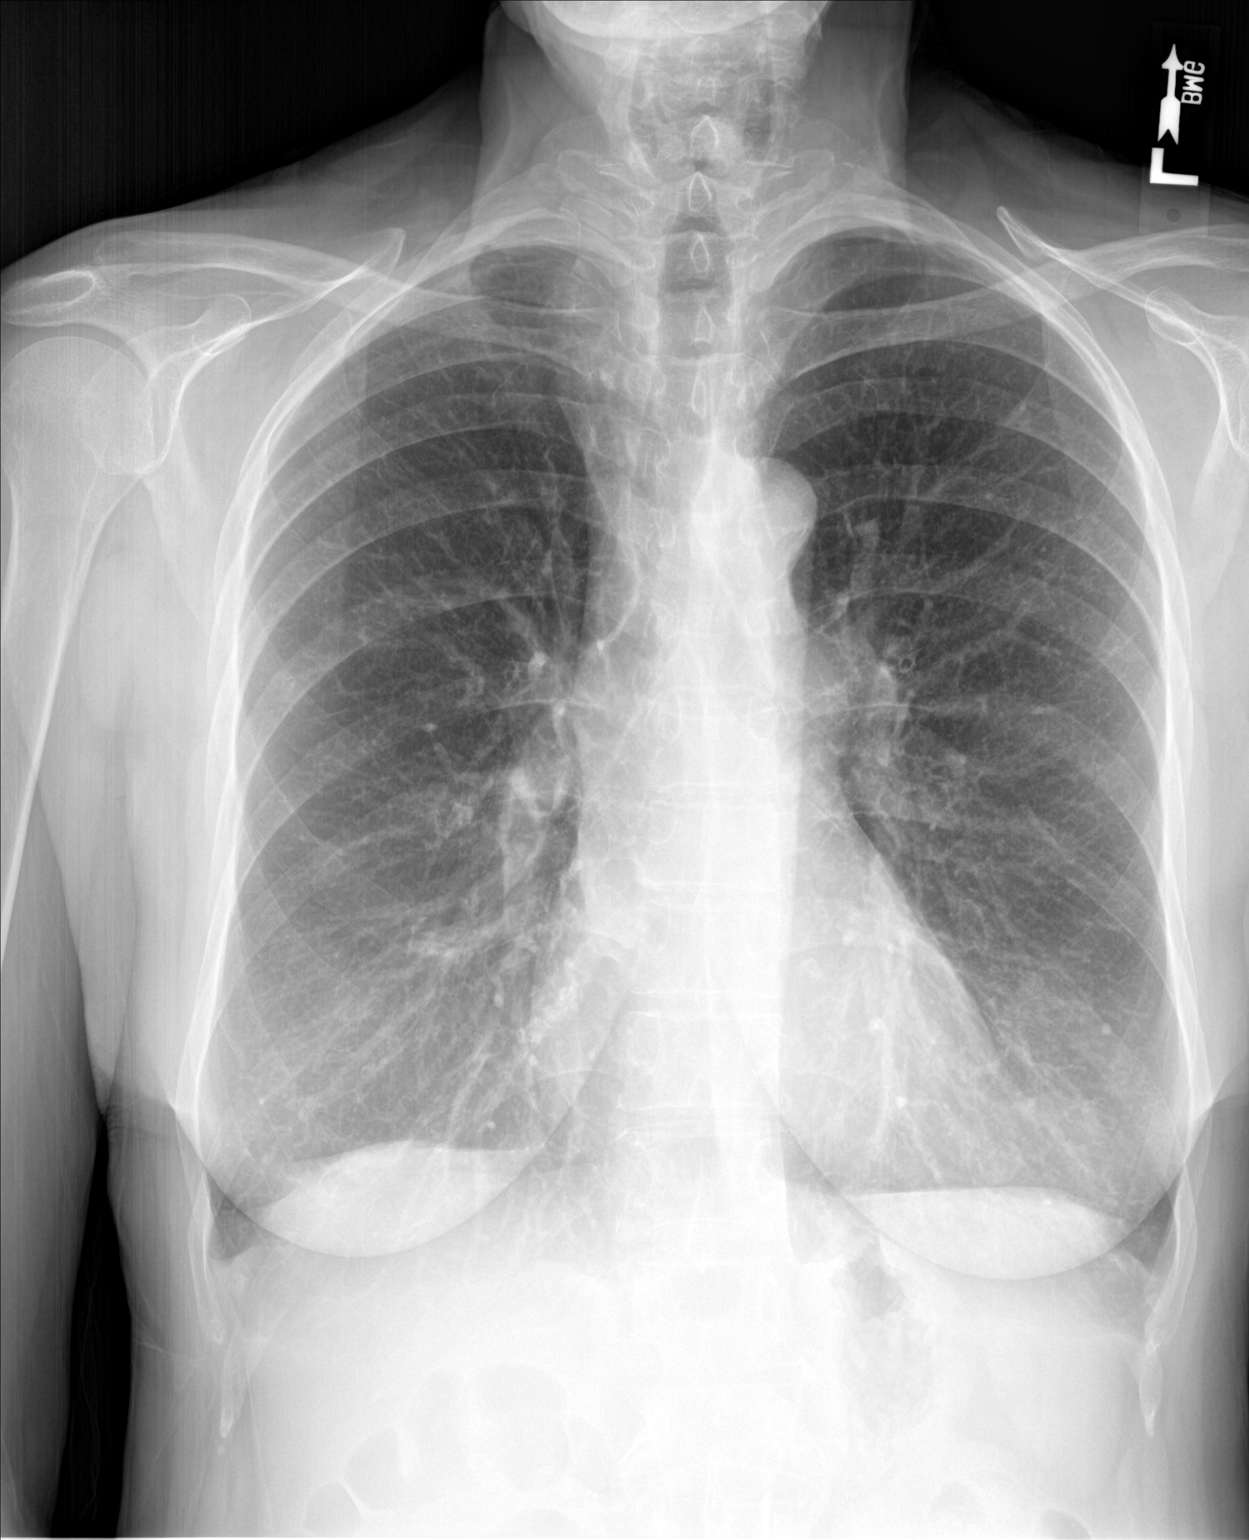

[chest lat]
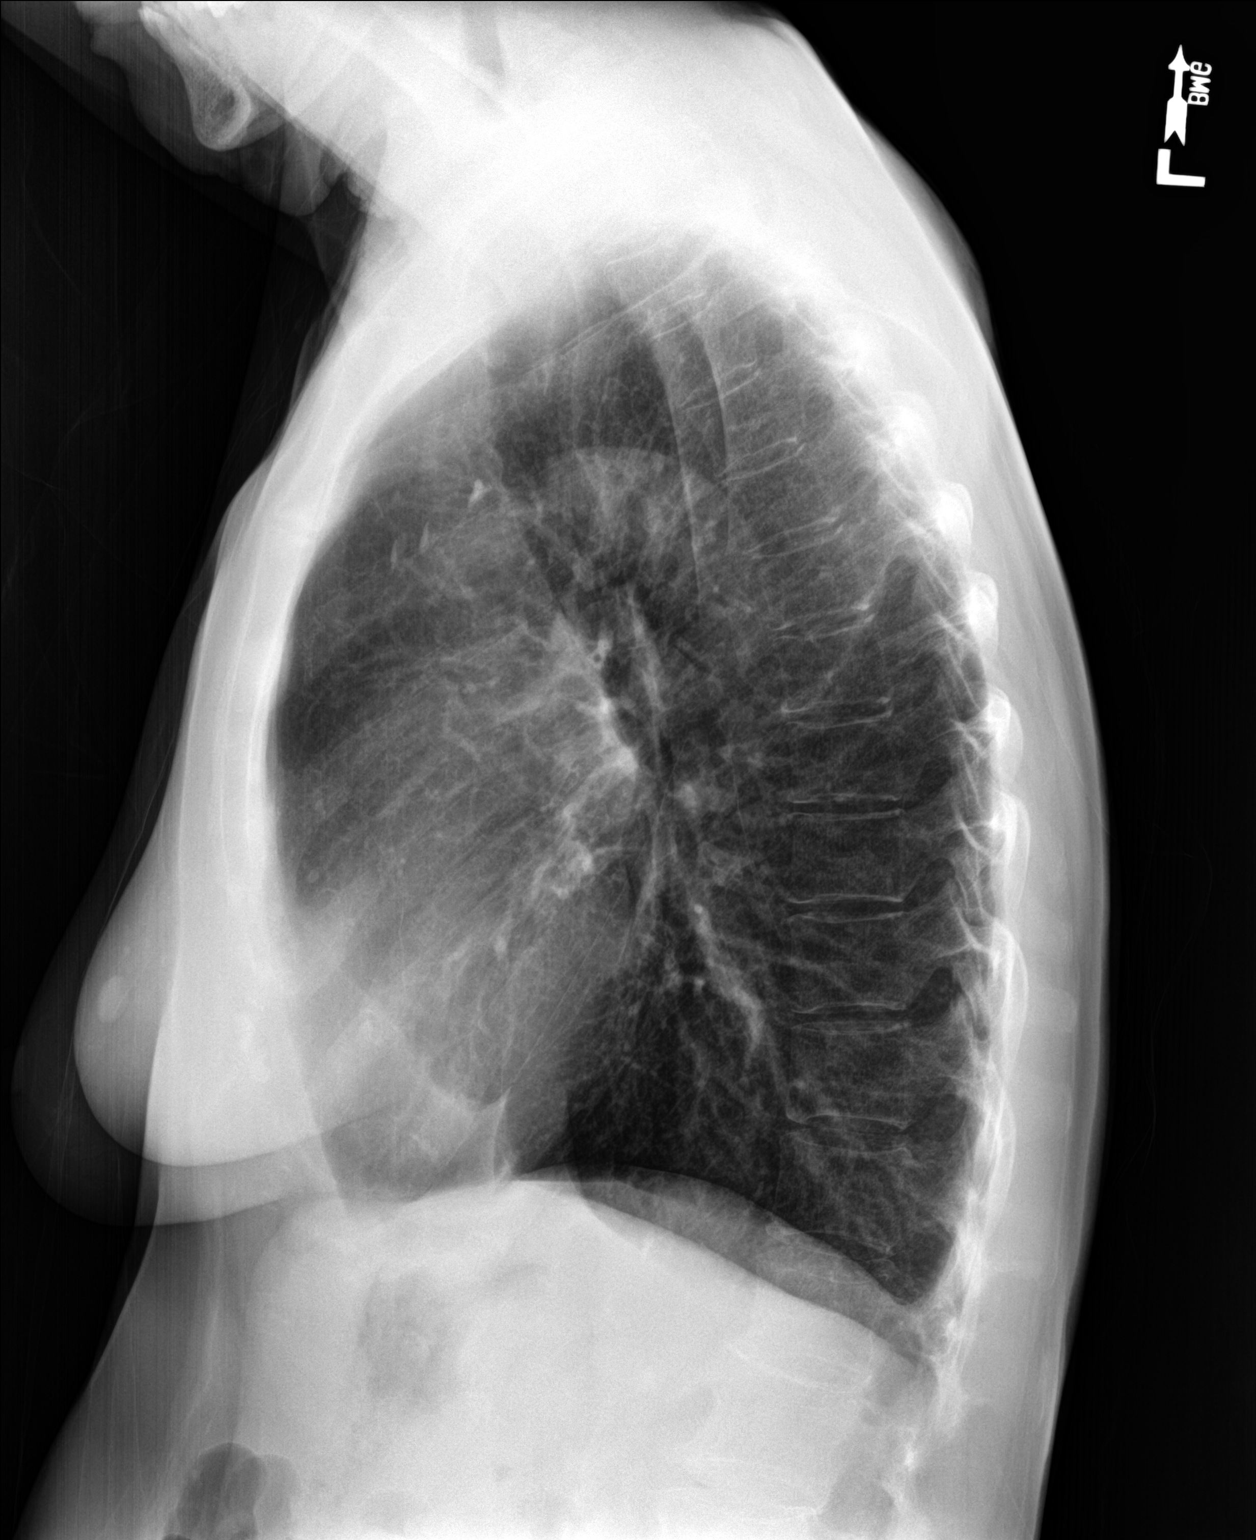

[2 of 2 positions shown; findings below may reference images not displayed]

FINDINGS: The lungs are hyperinflated with hemidiaphragm flattening. There is
no focal infiltrate. There is no pleural effusion. The heart and
pulmonary vascularity are normal. There calcification in the wall of
the aortic arch. The bony thorax exhibits no acute abnormality.
IMPRESSION: COPD. No pneumonia, CHF, nor other acute cardiopulmonary
abnormality.

Thoracic aortic atherosclerosis.

## 2017-10-29 MED ORDER — ALBUTEROL SULFATE HFA 108 (90 BASE) MCG/ACT IN AERS
2.0000 | INHALATION_SPRAY | RESPIRATORY_TRACT | 1 refills | Status: AC | PRN
Start: 2017-10-29 — End: ?

## 2017-10-29 MED ORDER — BENZONATATE 100 MG PO CAPS: 100.0000 mg | ORAL_CAPSULE | Freq: Three times a day (TID) | ORAL | 0 refills | Status: DC | PRN

## 2017-10-29 MED ORDER — ALBUTEROL SULFATE (2.5 MG/3ML) 0.083% IN NEBU
2.5000 mg | INHALATION_SOLUTION | Freq: Once | RESPIRATORY_TRACT | Status: AC
  Administered 2017-10-29: 2.5 mg via RESPIRATORY_TRACT

## 2017-10-29 MED ORDER — AZITHROMYCIN 250 MG PO TABS: ORAL_TABLET | ORAL | 0 refills | Status: DC

## 2017-10-29 MED ORDER — IPRATROPIUM BROMIDE 0.02 % IN SOLN
0.5000 mg | Freq: Once | RESPIRATORY_TRACT | Status: AC
  Administered 2017-10-29: 0.5 mg via RESPIRATORY_TRACT

## 2017-10-29 MED ORDER — GUAIFENESIN ER 1200 MG PO TB12: 1.0000 | ORAL_TABLET | Freq: Two times a day (BID) | ORAL | 1 refills | Status: DC | PRN

## 2017-10-29 NOTE — Progress Notes (Signed)
Subjective:    Patient ID: [name], female    DOB: Sep 23, 1950, 67 y.o.   MRN: [id number]  HPI Patient came in with acute bronchitis x five days (since Friday.) She has experienced severe episodes of coughing lasting 10-15 minutes at a time. These episodes are productive and if she has eaten before it will "come up." The coughing is worse at night and is accompanied by chest congestion, fatigue, headache,hoarseness, and decreased appetite. Additionally, patient has no fever or chest pain. Patient smokes every day and had a similar episode of bronchitis on NYE that was treated successfully with Azithromycin and Tessalon Perles. She is 4L/min of oxygen via nasal cannula and her pulse ox is at 97% on it.  Patient Active Problem List   Diagnosis Date Noted  . Wears partial dentures 09/15/2017  . Smoker 09/15/2017   No Known Allergies   Prior to Admission medications   Medication Sig Start Date End Date Taking? Authorizing Provider  Guaifenesin (MUCINEX MAXIMUM STRENGTH) 1200 MG TB12 Take 1 tablet (1,200 mg total) by mouth every 12 (twelve) hours as needed. 09/15/17  Yes Jeffery, Chelle, PA-C  azelastine (ASTELIN) 0.1 % nasal spray Place 2 sprays into both nostrils 2 (two) times daily. Use in each nostril as directed Patient not taking: Reported on 10/29/2017 09/15/17   [name], PA-C    Past Medical History:  Diagnosis Date  . Cataract    Social History   Socioeconomic History  . Marital status: Married    Spouse name: Not on file  . Number of children: Not on file  . Years of education: Not on file  . Highest education level: Not on file  Social Needs  . Financial resource strain: Not on file  . Food insecurity - worry: Not on file  . Food insecurity - inability: Not on file  . Transportation needs - medical: Not on file  . Transportation needs - non-medical: Not on file  Occupational History  . Not on file  Tobacco Use  . Smoking status: Current Every Day Smoker    . Smokeless tobacco: Never Used  Substance and Sexual Activity  . Alcohol use: Yes    Alcohol/week: 12.0 oz    Types: 20 Standard drinks or equivalent per week  . Drug use: No  . Sexual activity: Not on file  Other Topics Concern  . Not on file  Social History Narrative  . Not on file   Family History  Problem Relation Age of Onset  . Mental illness Mother   . Cancer Father    Past Surgical History:  Procedure Laterality Date  . COLON SURGERY       Review of Systems  Constitutional: Positive for appetite change.  HENT: Negative.   Eyes: Negative.   Respiratory: Positive for cough, chest tightness, shortness of breath and wheezing.   Cardiovascular: Negative.   Gastrointestinal: Negative.   Skin: Negative.        Objective:   Physical Exam  Constitutional: She appears well-developed and well-nourished.  BP (!) 102/54   Pulse 89   Temp 99.7 F (37.6 C) (Oral)   Resp 18   Ht 5\' 4"  (1.626 m)   Wt 114 lb 9.6 oz (52 kg)   SpO2 97% Comment: With oxygen usage  BMI 19.67 kg/m   HENT:  Head: Normocephalic and atraumatic.  Right Ear: External ear normal.  Left Ear: External ear normal.  Nose: Nose normal.  Mouth/Throat: Oropharynx is clear and moist.  White layer of debris on tongue.  Eyes: Conjunctivae and EOM are normal. Pupils are equal, round, and reactive to light.  Neck: Normal range of motion. Neck supple.  Cardiovascular: Normal rate, regular rhythm, normal heart sounds and intact distal pulses.  Pulmonary/Chest: She has wheezes. She has rales.  Abdominal: Soft. Bowel sounds are normal.  Skin: Skin is warm and dry.      Wt Readings from Last 3 Encounters:  10/29/17 114 lb 9.6 oz (52 kg)  09/15/17 113 lb 6.4 oz (51.4 kg)    Dg Chest 2 View  Result Date: 10/29/2017 CLINICAL DATA:  Cough, wheezing, longstanding smoker.  Suspect COPD. EXAM: CHEST  2 VIEW COMPARISON:  Chest x-ray of May 14, 2007 FINDINGS: The lungs are hyperinflated with  hemidiaphragm flattening. There is no focal infiltrate. There is no pleural effusion. The heart and pulmonary vascularity are normal. There calcification in the wall of the aortic arch. The bony thorax exhibits no acute abnormality. IMPRESSION: COPD. No pneumonia, CHF, nor other acute cardiopulmonary abnormality. Thoracic aortic atherosclerosis. Electronically Signed   By: David  Martinique M.D.   On: 10/29/2017 12:33      Assessment & Plan:   1. Acute bronchitis, unspecified organism - CBC with Differential/Platelet - DG Chest 2 View; Future - albuterol (PROVENTIL) (2.5 MG/3ML) 0.083% nebulizer solution 2.5 mg - ipratropium (ATROVENT) nebulizer solution 0.5 mg  2. Smoker Patient was counseled on quitting smoking.  3. Screening for colon cancer - Ambulatory referral to Gastroenterology  4. Need for hepatitis C screening test - Hepatitis C antibody  5. Screening for hyperlipidemia - Lipid panel  6. Screening for metabolic disorder - Comprehensive metabolic panel  7. Screening for thyroid disorder - TSH  8. Estrogen deficiency - DG Bone Density; Future  9. Encounter for screening mammogram for breast cancer - MM Digital Screening; Future  Follow up in one week to check pulse oximetry.

## 2017-10-29 NOTE — Patient Instructions (Addendum)
     IF you received an x-ray today, you will receive an invoice from [facility]. Please contact [facility] at [phone] with questions or concerns regarding your invoice.   IF you received labwork today, you will receive an invoice from [facility]. Please contact [facility] at [phone] with questions or concerns regarding your invoice.   Our billing staff will not be able to assist you with questions regarding bills from these companies.  You will be contacted with the lab results as soon as they are available. The fastest way to get your results is to activate your My Chart account. Instructions are located on the last page of this paperwork. If you have not heard from [place] regarding the results in 2 weeks, please contact this office.    There are some gaps in your care that we need to address, in addition to your illness today.  I have referred you to have a mammogram, bone density test and colonoscopy. Those facilities will be contacting you directly to schedule the appointments.  When you are well, we will update your immunizations (you need the pneumonia vaccine, the tetanus booster, and shingles vaccines).  Did you know that you begin to benefit from quitting smoking within the first twenty minutes? It's TRUE.  At 20 minutes: -blood pressure decreases -pulse rate drops -body temperature of hands and feet increases  At 8 hours: -carbon monoxide level in blood drops to normal -oxygen level in blood increases to normal  At 24 hours: -the chance of heart attack decreases  At 48 hours: -nerve endings start regrowing -ability to smell and taste is enhanced  2 weeks-3 months: -circulation improves -walking becomes easier -lung function improves  1-9 months: -coughing, sinus congestion, fatigue and shortness of breath decreases  1 year: -excess risk of heart disease is decreased to HALF that of a smoker  5 years: Stroke risk is reduced to that of  people who have never smoked  10 years: -risk of lung cancer drops to as little as half that of continuing smokers -risk of cancer of the mouth, throat, esophagus, bladder, kidney and pancreas decreases -risk of ulcer decreases  15 years -risk of heart disease is now similar to that of people who have never smoked -risk of death returns to nearly the level of people who have never smoked

## 2017-10-29 NOTE — Progress Notes (Signed)
Patient ID: [name], female    DOB: September 29, 1950, 67 y.o.   MRN: [id number]  PCP: [name], PA-C  Chief Complaint  Patient presents with  . Bronchitis    x 5 days    Subjective:   Presents for evaluation of presumed bronchitis x 5 days.  She is a relatively new patient to me. I saw her for same on 09/15/2018. Her symptoms completely resolved with the treatment provided then: azithromycin, tessalon perles, Mucinex. She is a long-standing smoker.  Reports that until her visit on 12/31, she had not had an episode of bronchitis in 8-10 years. She has cough, productive of whitish sputum in jags lasting 10-15 minutes, causing SOB. Reports no SOB with exertion or at rest as long as she is not coughing. Reports headache, fatigue and loss of appetite.  No fever, chills. No nasal/sinus congestion, ear fullness or pain. No nausea, vomiting, diarrhea.   Health Maintenance Due  Topic Date Due  . Hepatitis C Screening  1951-01-29  . TETANUS/TDAP  11/24/1968  . MAMMOGRAM  11/25/1999  . COLONOSCOPY  11/25/1999  . DEXA SCAN  11/25/2014  . PNA vac Low Risk Adult (1 of 2 - PCV13) 11/25/2014   .    Review of Systems As above.    Patient Active Problem List   Diagnosis Date Noted  . Wears partial dentures 09/15/2017  . Smoker 09/15/2017     Prior to Admission medications   Medication Sig Start Date End Date Taking? Authorizing Provider  Guaifenesin (MUCINEX MAXIMUM STRENGTH) 1200 MG TB12 Take 1 tablet (1,200 mg total) by mouth every 12 (twelve) hours as needed. 09/15/17  Yes Aikam Hellickson, PA-C  azelastine (ASTELIN) 0.1 % nasal spray Place 2 sprays into both nostrils 2 (two) times daily. Use in each nostril as directed Patient not taking: Reported on 10/29/2017 09/15/17   [name], PA-C     No Known Allergies     Objective:  Physical Exam  Constitutional: She is oriented to person, place, and time. She appears well-developed and well-nourished.  She is active and cooperative. No distress.  BP (!) 102/54   Pulse 89   Temp 99.7 F (37.6 C) (Oral)   Resp 18   Ht 5\' 4"  (1.626 m)   Wt 114 lb 9.6 oz (52 kg)   SpO2 94%   BMI 19.67 kg/m   HENT:  Head: Normocephalic and atraumatic.  Right Ear: Hearing and external ear normal.  Left Ear: Hearing and external ear normal.  Nose: Nose normal.  Mouth/Throat: Oropharynx is clear and moist and mucous membranes are normal. She has dentures.  Eyes: Conjunctivae are normal. No scleral icterus.  Neck: Normal range of motion. Neck supple. No thyromegaly present.  Cardiovascular: Normal rate, regular rhythm and normal heart sounds.  Pulses:      Radial pulses are 2+ on the right side, and 2+ on the left side.  Pulmonary/Chest: Effort normal. She has decreased breath sounds. She has wheezes (diffuse, high-pitched, musical; significantly improved with albuterol + Atrovent neb). She has no rhonchi. She has no rales.  Lymphadenopathy:       Head (right side): No tonsillar, no preauricular, no posterior auricular and no occipital adenopathy present.       Head (left side): No tonsillar, no preauricular, no posterior auricular and no occipital adenopathy present.    She has no cervical adenopathy.       Right: No supraclavicular adenopathy present.  Left: No supraclavicular adenopathy present.  Neurological: She is alert and oriented to person, place, and time. No sensory deficit.  Skin: Skin is warm, dry and intact. No rash noted. No cyanosis or erythema. Nails show no clubbing.  Psychiatric: She has a normal mood and affect. Her speech is normal and behavior is normal.       Dg Chest 2 View  Result Date: 10/29/2017 CLINICAL DATA:  Cough, wheezing, longstanding smoker.  Suspect COPD. EXAM: CHEST  2 VIEW COMPARISON:  Chest x-ray of May 14, 2007 FINDINGS: The lungs are hyperinflated with hemidiaphragm flattening. There is no focal infiltrate. There is no pleural effusion. The heart and  pulmonary vascularity are normal. There calcification in the wall of the aortic arch. The bony thorax exhibits no acute abnormality. IMPRESSION: COPD. No pneumonia, CHF, nor other acute cardiopulmonary abnormality. Thoracic aortic atherosclerosis. Electronically Signed   By: David  Martinique M.D.   On: 10/29/2017 12:33       Assessment & Plan:   1. Acute bronchitis, unspecified organism These symptoms, history and radiographs suggest COPD exacerbation.  Anticipate need for steroid inhaler, possibly pulmonology referral in the future. Smoking cessation stressed. - CBC with Differential/Platelet - DG Chest 2 View; Future - albuterol (PROVENTIL) (2.5 MG/3ML) 0.083% nebulizer solution 2.5 mg - ipratropium (ATROVENT) nebulizer solution 0.5 mg - azithromycin (ZITHROMAX) 250 MG tablet; Take 2 tabs PO x 1 dose, then 1 tab PO QD x 4 days  Dispense: 6 tablet; Refill: 0 - benzonatate (TESSALON) 100 MG capsule; Take 1-2 capsules (100-200 mg total) by mouth 3 (three) times daily as needed for cough.  Dispense: 40 capsule; Refill: 0 - Guaifenesin (MUCINEX MAXIMUM STRENGTH) 1200 MG TB12; Take 1 tablet (1,200 mg total) by mouth every 12 (twelve) hours as needed.  Dispense: 14 tablet; Refill: 1 - albuterol (PROVENTIL HFA;VENTOLIN HFA) 108 (90 Base) MCG/ACT inhaler; Inhale 2 puffs into the lungs every 4 (four) hours as needed for wheezing or shortness of breath (cough, shortness of breath or wheezing.).  Dispense: 1 Inhaler; Refill: 1  2. Smoker Cessation discussed. Will discuss again at follow-up visit.  3. Screening for colon cancer - Ambulatory referral to Gastroenterology  4. Need for hepatitis C screening test - Hepatitis C antibody  5. Screening for hyperlipidemia - Lipid panel  6. Screening for metabolic disorder - Comprehensive metabolic panel  7. Screening for thyroid disorder - TSH  8. Estrogen deficiency - DG Bone Density; Future  9. Encounter for screening mammogram for breast cancer -  MM Digital Screening; Future    Return in about 1 week (around 11/05/2017) for re-evaluation of bronchitis/COPD.   Fara Chute, PA-C [facility]

## 2017-10-30 LAB — COMPREHENSIVE METABOLIC PANEL
ALT: 16 IU/L (ref 0–32)
AST: 18 IU/L (ref 0–40)
Albumin/Globulin Ratio: 1.6 (ref 1.2–2.2)
Albumin: 3.9 g/dL (ref 3.6–4.8)
Alkaline Phosphatase: 73 IU/L (ref 39–117)
BUN/Creatinine Ratio: 11 — ABNORMAL LOW (ref 12–28)
BUN: 5 mg/dL — AB (ref 8–27)
Bilirubin Total: 0.4 mg/dL (ref 0.0–1.2)
CALCIUM: 8.6 mg/dL — AB (ref 8.7–10.3)
CO2: 25 mmol/L (ref 20–29)
CREATININE: 0.45 mg/dL — AB (ref 0.57–1.00)
Chloride: 93 mmol/L — ABNORMAL LOW (ref 96–106)
GFR calc Af Amer: 120 mL/min/{1.73_m2} (ref >59)
GFR calc non Af Amer: 104 mL/min/{1.73_m2} (ref >59)
GLOBULIN, TOTAL: 2.5 g/dL (ref 1.5–4.5)
GLUCOSE: 106 mg/dL — AB (ref 65–99)
Potassium: 4.2 mmol/L (ref 3.5–5.2)
Sodium: 135 mmol/L (ref 134–144)
Total Protein: 6.4 g/dL (ref 6.0–8.5)

## 2017-10-30 LAB — CBC WITH DIFFERENTIAL/PLATELET
BASOS ABS: 0 10*3/uL (ref 0.0–0.2)
Basos: 0 %
EOS (ABSOLUTE): 0 10*3/uL (ref 0.0–0.4)
EOS: 0 %
HEMATOCRIT: 40.2 % (ref 34.0–46.6)
Hemoglobin: 13.1 g/dL (ref 11.1–15.9)
IMMATURE GRANULOCYTES: 0 %
Immature Grans (Abs): 0 10*3/uL (ref 0.0–0.1)
Lymphocytes Absolute: 1 10*3/uL (ref 0.7–3.1)
Lymphs: 20 %
MCH: 30.8 pg (ref 26.6–33.0)
MCHC: 32.6 g/dL (ref 31.5–35.7)
MCV: 95 fL (ref 79–97)
MONOCYTES: 16 %
Monocytes Absolute: 0.8 10*3/uL (ref 0.1–0.9)
NEUTROS PCT: 64 %
Neutrophils Absolute: 3.1 10*3/uL (ref 1.4–7.0)
PLATELETS: 173 10*3/uL (ref 150–379)
RBC: 4.25 x10E6/uL (ref 3.77–5.28)
RDW: 12.8 % (ref 12.3–15.4)
WBC: 4.8 10*3/uL (ref 3.4–10.8)

## 2017-10-30 LAB — LIPID PANEL
CHOL/HDL RATIO: 3.4 ratio (ref 0.0–4.4)
Cholesterol, Total: 126 mg/dL (ref 100–199)
HDL: 37 mg/dL — AB (ref >39)
LDL CALC: 77 mg/dL (ref 0–99)
TRIGLYCERIDES: 59 mg/dL (ref 0–149)
VLDL CHOLESTEROL CAL: 12 mg/dL (ref 5–40)

## 2017-10-30 LAB — HEPATITIS C ANTIBODY: Hep C Virus Ab: <0.1 s/co ratio (ref 0.0–0.9)

## 2017-10-30 LAB — TSH: TSH: 0.687 u[IU]/mL (ref 0.450–4.500)

## 2017-11-04 ENCOUNTER — Ambulatory Visit: Payer: Medicare Other | Admitting: Physician Assistant

## 2017-11-04 ENCOUNTER — Other Ambulatory Visit: Payer: Self-pay

## 2017-11-04 ENCOUNTER — Encounter: Payer: Self-pay | Admitting: Physician Assistant

## 2017-11-04 VITALS — BP 112/82 | HR 67 | Temp 98.1°F | Ht 64.0 in | Wt 113.8 lb

## 2017-11-04 DIAGNOSIS — J441 Chronic obstructive pulmonary disease with (acute) exacerbation: Secondary | ICD-10-CM | POA: Diagnosis not present

## 2017-11-04 DIAGNOSIS — J449 Chronic obstructive pulmonary disease, unspecified: Secondary | ICD-10-CM | POA: Insufficient documentation

## 2017-11-04 DIAGNOSIS — F17200 Nicotine dependence, unspecified, uncomplicated: Secondary | ICD-10-CM | POA: Diagnosis not present

## 2017-11-04 MED ORDER — AEROCHAMBER PLUS MISC: 2 refills | Status: DC

## 2017-11-04 NOTE — Progress Notes (Signed)
.     Patient ID: [name], female    DOB: June 01, 1951, 67 y.o.   MRN: [id number]  PCP: [name], PA-C  Chief Complaint  Patient presents with  . Follow-up    still having some coughing but not as much as before with some phlegm.     Subjective:   Presents for evaluation of bronchitis, probably COPD exacerbation.  She presented 10/29/2017 with cough and SOB x 5 days. She was treated with azithromycin, as her smoking history increased the risk of bacterial etiology, along with albuterol inhaler and benzonatate. CXR revealed COPD and incidentally noted thoracic aortic atherosclerosis. No infiltrates or CHF or other abnormality noted.  While not totally resolved, her symptoms are significantly improved. She has not been smoking due to SOB when she does. Some chest tightness persists. Appetite is improving. Fatigue is improving. No HA in several days. Dizziness/lightheadedness is improving.  Review of Systems As above. No CP, fever, chills, HA, nausea, vomiting, diarrhea.    Patient Active Problem List   Diagnosis Date Noted  . Wears partial dentures 09/15/2017  . Smoker 09/15/2017     Prior to Admission medications   Medication Sig Start Date End Date Taking? Authorizing Provider  albuterol (PROVENTIL HFA;VENTOLIN HFA) 108 (90 Base) MCG/ACT inhaler Inhale 2 puffs into the lungs every 4 (four) hours as needed for wheezing or shortness of breath (cough, shortness of breath or wheezing.). 10/29/17  Yes Arias Weinert, PA-C  azithromycin (ZITHROMAX) 250 MG tablet Take 2 tabs PO x 1 dose, then 1 tab PO QD x 4 days 10/29/17  Yes Gretta Samons, PA-C  Guaifenesin (MUCINEX MAXIMUM STRENGTH) 1200 MG TB12 Take 1 tablet (1,200 mg total) by mouth every 12 (twelve) hours as needed. 10/29/17  Yes Yanin Muhlestein, PA-C  azelastine (ASTELIN) 0.1 % nasal spray Place 2 sprays into both nostrils 2 (two) times daily. Use in each nostril as directed Patient not taking: Reported on  10/29/2017 09/15/17   [name], PA-C  benzonatate (TESSALON) 100 MG capsule Take 1-2 capsules (100-200 mg total) by mouth 3 (three) times daily as needed for cough. Patient not taking: Reported on 11/04/2017 10/29/17   [name], PA-C     No Known Allergies     Objective:  Physical Exam  Constitutional: She is oriented to person, place, and time. She appears well-developed and well-nourished. She is active and cooperative. No distress.  BP 112/82 (BP Location: Left Arm, Patient Position: Sitting, Cuff Size: Normal)   Pulse 67   Temp 98.1 F (36.7 C) (Oral)   Ht 5\' 4"  (1.626 m)   Wt 113 lb 12.8 oz (51.6 kg)   SpO2 94%   BMI 19.53 kg/m   HENT:  Head: Normocephalic and atraumatic.  Right Ear: Hearing normal.  Left Ear: Hearing normal.  Eyes: Conjunctivae are normal. No scleral icterus.  Neck: Normal range of motion. Neck supple. No thyromegaly present.  Cardiovascular: Normal rate, regular rhythm and normal heart sounds.  Pulses:      Radial pulses are 2+ on the right side, and 2+ on the left side.  Pulmonary/Chest: Effort normal and breath sounds normal.  Lymphadenopathy:       Head (right side): No tonsillar, no preauricular, no posterior auricular and no occipital adenopathy present.       Head (left side): No tonsillar, no preauricular, no posterior auricular and no occipital adenopathy present.    She has no cervical adenopathy.       Right: No supraclavicular  adenopathy present.       Left: No supraclavicular adenopathy present.  Neurological: She is alert and oriented to person, place, and time. No sensory deficit.  Skin: Skin is warm, dry and intact. No rash noted. No cyanosis or erythema. Nails show no clubbing.  Psychiatric: She has a normal mood and affect. Her speech is normal and behavior is normal.           Assessment & Plan:   Problem List Items Addressed This Visit    Smoker    She has not smoked  the beginning of this illness. Encouraged her  to not resume.      COPD exacerbation ([place]) - Primary    She has completed the azithromycin. Continue supportive care. Spacer for use with albuterol to improve efficacy. Plan office spirometry at her next visit to establish baseline. Consider addition of steroid inhaler.      Relevant Medications   Spacer/Aero-Holding Chambers (AEROCHAMBER PLUS) inhaler       Return in about 4 weeks (around 12/02/2017) for re-evalaution of COPD with office spirometry.   Fara Chute, PA-C [facility] [place]

## 2017-11-04 NOTE — Patient Instructions (Addendum)
Keep up the great work not smoking!  Try the spacer with the inhaler.  We will do some breathing tests at your next visit.  IF you received an x-ray today, you will receive an invoice from [facility]. Please contact [facility] at [phone] with questions or concerns regarding your invoice.   IF you received labwork today, you will receive an invoice from [facility]. Please contact [facility] at [phone] with questions or concerns regarding your invoice.   Our billing staff will not be able to assist you with questions regarding bills from these companies.  You will be contacted with the lab results as soon as they are available. The fastest way to get your results is to activate your My Chart account. Instructions are located on the last page of this paperwork. If you have not heard from [place] regarding the results in 2 weeks, please contact this office.

## 2017-11-04 NOTE — Progress Notes (Signed)
Subjective:    Patient ID: [name], female    DOB: 1951-01-21, 67 y.o.   MRN: [id number]  HPI Patient reports for a follow up of the bronchitis that was diagnosed on 10/29/2017. The bronchitis was treated Azithromycin, Albuterol inhaler, and Tessalon Perles. While she is not back to her baseline, she is feeling 'much better' and her cough has improved significantly. She still has occasional episodes of coughing, but they are minimally productive and she has less SOB. Patient is still fatigued. Patient has not been able to smoke cigarettes for the duration of her illness.  Chief Complaint  Patient presents with  . Follow-up    still having some coughing but not as much as before with some phlegm.    Patient Active Problem List   Diagnosis Date Noted  . COPD exacerbation ([place]) 11/04/2017  . Wears partial dentures 09/15/2017  . Smoker 09/15/2017   No Known Allergies   Prior to Admission medications   Medication Sig Start Date End Date Taking? Authorizing Provider  albuterol (PROVENTIL HFA;VENTOLIN HFA) 108 (90 Base) MCG/ACT inhaler Inhale 2 puffs into the lungs every 4 (four) hours as needed for wheezing or shortness of breath (cough, shortness of breath or wheezing.). 10/29/17  Yes Jeffery, Chelle, PA-C  benzonatate (TESSALON) 100 MG capsule Take 1-2 capsules (100-200 mg total) by mouth 3 (three) times daily as needed for cough. 10/29/17  Yes Jeffery, Chelle, PA-C  Guaifenesin (MUCINEX MAXIMUM STRENGTH) 1200 MG TB12 Take 1 tablet (1,200 mg total) by mouth every 12 (twelve) hours as needed. 10/29/17  Yes [name], PA-C  Spacer/Aero-Holding Chambers (AEROCHAMBER PLUS) inhaler Use as instructed 11/04/17   [name], PA-C   Past Medical History:  Diagnosis Date  . Cataract    Social History   Socioeconomic History  . Marital status: Married    Spouse name: Not on file  . Number of children: Not on file  . Years of education: Not on file  . Highest education level: Not  on file  Social Needs  . Financial resource strain: Not on file  . Food insecurity - worry: Not on file  . Food insecurity - inability: Not on file  . Transportation needs - medical: Not on file  . Transportation needs - non-medical: Not on file  Occupational History  . Not on file  Tobacco Use  . Smoking status: Current Every Day Smoker  . Smokeless tobacco: Never Used  Substance and Sexual Activity  . Alcohol use: Yes    Alcohol/week: 12.0 oz    Types: 20 [facility] drinks or equivalent per week  . Drug use: No  . Sexual activity: Not on file  Other Topics Concern  . Not on file  Social History Narrative  . Not on file   Family History  Problem Relation Age of Onset  . Mental illness Mother   . Cancer Father    Past Surgical History:  Procedure Laterality Date  . COLON SURGERY      Review of Systems  Constitutional: Positive for activity change (Activity decreased since 10/29/2017. Slowly improving, but decreased from baseline.), appetite change (Appetite improving, but not back 100%.) and fatigue. Negative for chills, diaphoresis, fever and unexpected weight change.  HENT: Negative for congestion, dental problem, drooling, ear discharge, ear pain, hearing loss, mouth sores, nosebleeds, postnasal drip, rhinorrhea, sinus pressure, sinus pain, sneezing, sore throat, tinnitus, trouble swallowing and voice change.   Eyes: Negative.  Negative for photophobia, discharge, itching and visual disturbance.  Respiratory: Positive for cough, chest tightness (Still some chest tightness, but imprving.) and shortness of breath (SOB on exertion. ). Negative for apnea, choking and stridor.   Cardiovascular: Negative.  Negative for chest pain, palpitations and leg swelling.  Gastrointestinal: Negative.  Negative for abdominal distention, abdominal pain, constipation, diarrhea, nausea and vomiting.  Musculoskeletal: Negative.  Negative for arthralgias, back pain, gait problem, joint swelling,  myalgias, neck pain and neck stiffness.  Skin: Negative for color change, pallor, rash and wound.  Neurological: Positive for dizziness (Mostly last week, improving significnatly. ), light-headedness and headaches (A few headaches 2-3 days ago.). Negative for tremors, seizures, syncope, facial asymmetry, speech difficulty, weakness and numbness.       Objective:   Physical Exam  Constitutional: She is oriented to person, place, and time. She appears well-developed and well-nourished.  BP 112/82 (BP Location: Left Arm, Patient Position: Sitting, Cuff Size: Normal)   Pulse 67   Temp 98.1 F (36.7 C) (Oral)   Ht 5\' 4"  (1.626 m)   Wt 113 lb 12.8 oz (51.6 kg)   SpO2 94%   BMI 19.53 kg/m   HENT:  Head: Normocephalic and atraumatic.  Right Ear: External ear normal.  Left Ear: External ear normal.  Nose: Nose normal.  Mouth/Throat: Oropharynx is clear and moist.  Eyes: Conjunctivae and EOM are normal. Pupils are equal, round, and reactive to light.  Neck: Normal range of motion. Neck supple.  Cardiovascular: Normal rate, regular rhythm, normal heart sounds and intact distal pulses.  Pulmonary/Chest: Effort normal and breath sounds normal. She has no wheezes. She exhibits tenderness.  Abdominal: Soft. Bowel sounds are normal. She exhibits no distension. There is no tenderness. There is no guarding.  Musculoskeletal: Normal range of motion. She exhibits no edema or tenderness.  Neurological: She is alert and oriented to person, place, and time. She has normal reflexes.  Skin: Skin is warm and dry. No rash noted. No erythema. No pallor.  Psychiatric: She has a normal mood and affect. Her behavior is normal. Judgment and thought content normal.       Assessment & Plan:   1. COPD exacerbation ([facility])  - Spacer/Aero-Holding Chambers (AEROCHAMBER PLUS) inhaler; Use as instructed  Dispense: 1 each; Refill: 2 - Re-evaluate in 4 weeks.  2. Smoker  -Patient has not smoked since beginning of  illness (prior to 10/29/2017.) She is trying to keep up her abstinence from cigarettes and I have encouraged this positive lifestyle change.  Return in about 4 weeks (around 12/02/2017) for re-evalaution of COPD with office spirometry.

## 2017-11-05 NOTE — Assessment & Plan Note (Signed)
She has completed the azithromycin. Continue supportive care. Spacer for use with albuterol to improve efficacy. Plan office spirometry at her next visit to establish baseline. Consider addition of steroid inhaler.

## 2017-11-05 NOTE — Assessment & Plan Note (Addendum)
She has not smoked  the beginning of this illness. Encouraged her to not resume.

## 2017-11-07 ENCOUNTER — Ambulatory Visit: Payer: Medicare Other | Admitting: Physician Assistant

## 2017-11-10 ENCOUNTER — Ambulatory Visit: Payer: Self-pay | Admitting: *Deleted

## 2017-11-10 NOTE — Telephone Encounter (Signed)
Pt called to get clarification on taking Mucinex for 7 days. Stated that according to the package, she should not take it for longer than that. Pt denies fever, cough or congestion. Advised to stop taking it according to the directions on the package.

## 2017-11-16 ENCOUNTER — Encounter: Payer: Self-pay | Admitting: Physician Assistant

## 2017-12-04 ENCOUNTER — Other Ambulatory Visit: Payer: Self-pay

## 2017-12-04 ENCOUNTER — Ambulatory Visit: Payer: Medicare Other | Admitting: Physician Assistant

## 2017-12-04 ENCOUNTER — Encounter: Payer: Self-pay | Admitting: Physician Assistant

## 2017-12-04 VITALS — BP 100/60 | HR 86 | Temp 99.2°F | Resp 16 | Ht 64.0 in | Wt 115.0 lb

## 2017-12-04 DIAGNOSIS — J441 Chronic obstructive pulmonary disease with (acute) exacerbation: Secondary | ICD-10-CM | POA: Diagnosis not present

## 2017-12-04 DIAGNOSIS — F17200 Nicotine dependence, unspecified, uncomplicated: Secondary | ICD-10-CM

## 2017-12-04 DIAGNOSIS — J42 Unspecified chronic bronchitis: Secondary | ICD-10-CM | POA: Diagnosis not present

## 2017-12-04 MED ORDER — BECLOMETHASONE DIPROPIONATE 40 MCG/ACT IN AERS: 2.0000 | INHALATION_SPRAY | Freq: Two times a day (BID) | RESPIRATORY_TRACT | 12 refills | Status: DC

## 2017-12-04 NOTE — Patient Instructions (Signed)
     IF you received an x-ray today, you will receive an invoice from [facility]. Please contact  Radiology at [phone] with questions or concerns regarding your invoice.   IF you received labwork today, you will receive an invoice from [facility]. Please contact [facility] at [phone] with questions or concerns regarding your invoice.   Our billing staff will not be able to assist you with questions regarding bills from these companies.  You will be contacted with the lab results as soon as they are available. The fastest way to get your results is to activate your My Chart account. Instructions are located on the last page of this paperwork. If you have not heard from us regarding the results in 2 weeks, please contact this office.     

## 2017-12-04 NOTE — Progress Notes (Signed)
Patient ID: [name], female    DOB: 1951-08-21, 67 y.o.   MRN: [id number]  PCP: [name], PA-C  Chief Complaint  Patient presents with  . COPD    follow up     Subjective:   Presents for evaluation of COPD since completion of treatment for COPD exacerbation and to discuss initiation of inhaled steroid.  Much better. Only using albuterol BID.  Review of Systems No CP, fever, chills, HA, dizziness, nausea, vomiting.    Patient Active Problem List   Diagnosis Date Noted  . COPD exacerbation ([place]) 11/04/2017  . Wears partial dentures 09/15/2017  . Smoker 09/15/2017     Prior to Admission medications   Medication Sig Start Date End Date Taking? Authorizing Provider  albuterol (PROVENTIL HFA;VENTOLIN HFA) 108 (90 Base) MCG/ACT inhaler Inhale 2 puffs into the lungs every 4 (four) hours as needed for wheezing or shortness of breath (cough, shortness of breath or wheezing.). 10/29/17  Yes [name], PA-C  Spacer/Aero-Holding Chambers (AEROCHAMBER PLUS) inhaler Use as instructed 11/04/17  Yes Sonnie Pawloski, PA-C     No Known Allergies     Objective:  Physical Exam  Constitutional: She is oriented to person, place, and time. She appears well-developed and well-nourished. She is active and cooperative. No distress.  BP 100/60   Pulse 86   Temp 99.2 F (37.3 C)   Resp 16   Ht 5\' 4"  (1.626 m)   Wt 115 lb (52.2 kg)   SpO2 96%   BMI 19.74 kg/m   HENT:  Head: Normocephalic and atraumatic.  Right Ear: Hearing normal.  Left Ear: Hearing normal.  Eyes: Conjunctivae are normal. No scleral icterus.  Neck: Normal range of motion. Neck supple. No thyromegaly present.  Cardiovascular: Normal rate, regular rhythm and normal heart sounds.  Pulses:      Radial pulses are 2+ on the right side, and 2+ on the left side.  Pulmonary/Chest: Effort normal and breath sounds normal.  Lymphadenopathy:       Head (right side): No tonsillar, no preauricular, no  posterior auricular and no occipital adenopathy present.       Head (left side): No tonsillar, no preauricular, no posterior auricular and no occipital adenopathy present.    She has no cervical adenopathy.       Right: No supraclavicular adenopathy present.       Left: No supraclavicular adenopathy present.  Neurological: She is alert and oriented to person, place, and time. No sensory deficit.  Skin: Skin is warm, dry and intact. No rash noted. No cyanosis or erythema. Nails show no clubbing.  Psychiatric: She has a normal mood and affect. Her speech is normal and behavior is normal.       Office Spirometry Results: FEV1: 1.02 liters FVC: 1.96 liters FEV1/FVC: 52 % FVC  % Predicted: 66 % FEV % Predicted: 68 % FeF 25-75: 0.45 liters FeF 25-75 % Predicted: 23     Assessment & Plan:   Problem List Items Addressed This Visit    Smoker    Encouraged continued non-smoking status.      COPD (chronic obstructive pulmonary disease) ([place]) - Primary    ADD inhaled steroid. Continue PRN rescue, with spacer.      Relevant Medications   beclomethasone (QVAR) 40 MCG/ACT inhaler       Return in about 1 month (around 01/01/2018) for re-evaluation of COPD, with repeat spirometry.   Fara Chute, PA-C Primary Care at [facility]

## 2017-12-22 ENCOUNTER — Encounter: Payer: Self-pay | Admitting: Physician Assistant

## 2017-12-22 NOTE — Assessment & Plan Note (Signed)
ADD inhaled steroid. Continue PRN rescue, with spacer.

## 2017-12-22 NOTE — Assessment & Plan Note (Signed)
Encouraged continued non-smoking status.

## 2018-01-06 ENCOUNTER — Other Ambulatory Visit: Payer: Self-pay

## 2018-01-06 ENCOUNTER — Encounter: Payer: Self-pay | Admitting: Physician Assistant

## 2018-01-06 ENCOUNTER — Ambulatory Visit: Payer: Medicare Other | Admitting: Physician Assistant

## 2018-01-06 VITALS — BP 102/78 | HR 74 | Temp 98.6°F | Resp 16 | Ht 64.0 in | Wt 115.8 lb

## 2018-01-06 DIAGNOSIS — F17200 Nicotine dependence, unspecified, uncomplicated: Secondary | ICD-10-CM

## 2018-01-06 DIAGNOSIS — J309 Allergic rhinitis, unspecified: Secondary | ICD-10-CM | POA: Diagnosis not present

## 2018-01-06 DIAGNOSIS — J42 Unspecified chronic bronchitis: Secondary | ICD-10-CM

## 2018-01-06 DIAGNOSIS — Z23 Encounter for immunization: Secondary | ICD-10-CM

## 2018-01-06 MED ORDER — FLUTICASONE-SALMETEROL 100-50 MCG/DOSE IN AEPB: 1.0000 | INHALATION_SPRAY | Freq: Two times a day (BID) | RESPIRATORY_TRACT | 12 refills | Status: DC

## 2018-01-06 MED ORDER — AZELASTINE HCL 0.1 % NA SOLN: 2.0000 | Freq: Two times a day (BID) | NASAL | 0 refills | Status: DC

## 2018-01-06 MED ORDER — FLUTICASONE PROPIONATE 50 MCG/ACT NA SUSP: 2.0000 | Freq: Every day | NASAL | 12 refills | Status: AC

## 2018-01-06 MED ORDER — LORATADINE 10 MG PO TABS: 10.0000 mg | ORAL_TABLET | Freq: Every day | ORAL | 11 refills | Status: DC

## 2018-01-06 NOTE — Progress Notes (Signed)
Patient ID: [name], female    DOB: March 25, 1951, 67 y.o.   MRN: [id number]  PCP: [name], PA-C  Chief Complaint  Patient presents with  . COPD    follow up    Subjective:   Presents for evaluation of COPD exacerbation.  At her most recent visit, 12/22/2017, we started beclomethasone inhaler.  She does not find it very helpful, unable to tolerate twice daily dosing.  She experienced headaches and an occasional sore throat.  Reducing back to once daily dosing those side effects have resolved.  No shortness of breath at rest or mild exertion. Continues to experience shortness of breath with moderate to heavy exertion.  Continues albuterol use 1-2 times daily, most days at at bedtime.  Has cut smoking back to 1/2 pack/day. Her husband continues to smoke.    Review of Systems Constitutional: Negative for activity change and appetite change.  HENT: Negative for congestion.   Eyes: Negative.   Respiratory: Positive for shortness of breath (with exertion). Negative for apnea, cough, choking, chest tightness, wheezing and stridor.   Cardiovascular: Negative for chest pain, palpitations and leg swelling.  Gastrointestinal: Negative.   Endocrine: Negative.   Genitourinary: Negative.   Musculoskeletal: Negative.   Allergic/Immunologic: Positive for environmental allergies.  Neurological: Negative.   Hematological: Negative.   Psychiatric/Behavioral: Negative.        Patient Active Problem List   Diagnosis Date Noted  . COPD (chronic obstructive pulmonary disease) ([place]) 11/04/2017  . Wears partial dentures 09/15/2017  . Smoker 09/15/2017     Prior to Admission medications   Medication Sig Start Date End Date Taking? Authorizing Provider  albuterol (PROVENTIL HFA;VENTOLIN HFA) 108 (90 Base) MCG/ACT inhaler Inhale 2 puffs into the lungs every 4 (four) hours as needed for wheezing or shortness of breath (cough, shortness of breath or wheezing.). 10/29/17  Yes  Davian Wollenberg, PA-C  beclomethasone (QVAR) 40 MCG/ACT inhaler Inhale 2 puffs into the lungs 2 (two) times daily. 12/04/17  Yes [name], PA-C  Spacer/Aero-Holding Chambers (AEROCHAMBER PLUS) inhaler Use as instructed 11/04/17  Yes Kensington Duerst, PA-C     No Known Allergies     Objective:  Physical Exam  Constitutional: She is oriented to person, place, and time. She appears well-developed and well-nourished. She is active and cooperative. No distress.  BP 102/78   Pulse 74   Temp 98.6 F (37 C) (Oral)   Resp 16   Ht 5\' 4"  (1.626 m)   Wt 115 lb 12.8 oz (52.5 kg)   SpO2 94%   BMI 19.88 kg/m   HENT:  Head: Normocephalic and atraumatic.  Right Ear: Hearing normal.  Left Ear: Hearing normal.  Eyes: Conjunctivae are normal. No scleral icterus.  Neck: Normal range of motion. Neck supple. No thyromegaly present.  Cardiovascular: Normal rate, regular rhythm and normal heart sounds.  Pulses:      Radial pulses are 2+ on the right side, and 2+ on the left side.  Pulmonary/Chest: Effort normal and breath sounds normal.  Lymphadenopathy:       Head (right side): No tonsillar, no preauricular, no posterior auricular and no occipital adenopathy present.       Head (left side): No tonsillar, no preauricular, no posterior auricular and no occipital adenopathy present.    She has no cervical adenopathy.       Right: No supraclavicular adenopathy present.       Left: No supraclavicular adenopathy present.  Neurological: She is alert and  oriented to person, place, and time. No sensory deficit.  Skin: Skin is warm, dry and intact. No rash noted. No cyanosis or erythema. Nails show no clubbing.  Psychiatric: She has a normal mood and affect. Her speech is normal and behavior is normal.       Office Spirometry Results: 01/06/2018 FEV1: 1.03 liters FVC: 2.04 liters FEV1/FVC: 50.5 % FVC  % Predicted: 67 % FEV % Predicted: 45 % FeF 25-75: 0.33 liters FeF 25-75 % Predicted:  16  Office Spirometry Results: 12/04/2017 FEV1: 1.02 liters FVC: 1.96 liters FEV1/FVC: 52 % FVC  % Predicted: 66 % FEV % Predicted: 68 % FeF 25-75: 0.45 liters FeF 25-75 % Predicted: 23   Assessment & Plan:   Problem List Items Addressed This Visit    Smoker    Continue efforts toward smoking cessation.      COPD (chronic obstructive pulmonary disease) ([place]) - Primary    Did not tolerate twice daily beclomethasone and daily dosing does not seem helpful to her.  Stop beclomethasone.  Start Advair twice daily, if tolerated.  Continue efforts towards smoking cessation and management of allergy type symptoms.      Relevant Medications   fluticasone (FLONASE) 50 MCG/ACT nasal spray   azelastine (ASTELIN) 0.1 % nasal spray   loratadine (CLARITIN) 10 MG tablet   Fluticasone-Salmeterol (ADVAIR DISKUS) 100-50 MCG/DOSE AEPB   Other Relevant Orders   Care order/instruction: (Completed)    Other Visit Diagnoses    Allergic rhinitis, unspecified seasonality, unspecified trigger       Relevant Medications   fluticasone (FLONASE) 50 MCG/ACT nasal spray   azelastine (ASTELIN) 0.1 % nasal spray   loratadine (CLARITIN) 10 MG tablet   Need for pneumococcal vaccination       Relevant Orders   Pneumococcal conjugate vaccine 13-valent (Completed)       Return in about 1 month (around 02/03/2018) for re-evalaution of allergies and breathing, with repeat office spirometry.   Fara Chute, PA-C Primary Care at [facility]

## 2018-01-06 NOTE — Progress Notes (Signed)
Subjective:    Patient ID: [name], female    DOB: Aug 02, 1951, 67 y.o.   MRN: [id number]   HPI   67 yo female presents for follow up of COPD. At her last visit, pt prescribed beclomethasone.  QVAR - Pt does not feel it is helping her. She is only taking it qDay. When she was taking it BID, she was having headaches, occasional sore throat. Side effects started 3-4 days after starting BID. Now that she is taking is QDAY she no longer has these side effects. Unable to recall if BID helped a significant amount with SOB.   Still having SOB with exertion, walking up hills (depending on depth).  Denies SOB at rest, or with walking on level ground.  Albuterol - using 1-2x/day. Usually before bed.   Smoking - She states she has "cut way back", about 1/2 pack per day.  Review of Systems  Constitutional: Negative for activity change and appetite change.  HENT: Negative for congestion.   Eyes: Negative.   Respiratory: Positive for shortness of breath (with exertion). Negative for apnea, cough, choking, chest tightness, wheezing and stridor.   Cardiovascular: Negative for chest pain, palpitations and leg swelling.  Gastrointestinal: Negative.   Endocrine: Negative.   Genitourinary: Negative.   Musculoskeletal: Negative.   Allergic/Immunologic: Positive for environmental allergies.  Neurological: Negative.   Hematological: Negative.   Psychiatric/Behavioral: Negative.     Patient Active Problem List   Diagnosis Date Noted  . COPD (chronic obstructive pulmonary disease) ([place]) 11/04/2017  . Wears partial dentures 09/15/2017  . Smoker 09/15/2017    Past Medical History:  Diagnosis Date  . Cataract     Prior to Admission medications   Medication Sig Start Date End Date Taking? Authorizing Provider  albuterol (PROVENTIL HFA;VENTOLIN HFA) 108 (90 Base) MCG/ACT inhaler Inhale 2 puffs into the lungs every 4 (four) hours as needed for wheezing or shortness of breath (cough,  shortness of breath or wheezing.). 10/29/17   [name], PA-C  beclomethasone (QVAR) 40 MCG/ACT inhaler Inhale 2 puffs into the lungs 2 (two) times daily. 12/04/17   [name], PA-C  Spacer/Aero-Holding Chambers (AEROCHAMBER PLUS) inhaler Use as instructed 11/04/17   [name], PA-C    No Known Allergies     Objective:   Physical Exam  Constitutional: She is oriented to person, place, and time. She appears well-developed and well-nourished. No distress.  HENT:  Head: Normocephalic and atraumatic.  Nose: Nose normal.  Eyes: Conjunctivae and EOM are normal. Right eye exhibits no discharge. Left eye exhibits no discharge.  Neck: Normal range of motion. Neck supple. No tracheal deviation present. No thyromegaly present.  Cardiovascular: Normal rate, regular rhythm and intact distal pulses. Exam reveals no gallop and no friction rub.  No murmur heard. Pulses:      Radial pulses are 2+ on the right side, and 2+ on the left side.       Dorsalis pedis pulses are 2+ on the right side, and 2+ on the left side.       Posterior tibial pulses are 2+ on the right side, and 2+ on the left side.  Pulmonary/Chest: Effort normal and breath sounds normal. No accessory muscle usage. No respiratory distress. She has no decreased breath sounds. She has no wheezes. She has no rales.  Musculoskeletal: Normal range of motion. She exhibits no edema.  Lymphadenopathy:    She has no cervical adenopathy.  Neurological: She is alert and oriented to person, place, and  time. She has normal reflexes.  Skin: Skin is warm and dry. She is not diaphoretic. No erythema.  Psychiatric: She has a normal mood and affect. Her behavior is normal.     Previous Spirometry Results (12/04/2017) FEV1: 1.02 liters FVC: 1.96 liters FEV1/FVC: 52 % FVC  % Predicted: 66 % FEV % Predicted: 68 % FeF 25-75: 0.45 liters FeF 25-75 % Predicted: 23  Today's Office Spirometry Results: FEV1: 1.03 liters FVC: 2.04  liters FEV1/FVC: 50.5 % FVC  % Predicted: 67 % FEV % Predicted: 45 % FeF 25-75: 0.33 liters FeF 25-75 % Predicted: 16        Assessment & Plan:  1. Chronic bronchitis, unspecified chronic bronchitis type ([place]) Pt unable to tolerate Qvar BID. Try advair BID to help improve lung function. Counseled on importance of smoking cessation. Counseled to use albuterol PRN but our goal is to minimize her use with maintenance medications.  - Care order/instruction: - Fluticasone-Salmeterol (ADVAIR DISKUS) 100-50 MCG/DOSE AEPB; Inhale 1 puff into the lungs 2 (two) times daily.  Dispense: 60 each; Refill: 12  2. Allergic rhinitis, unspecified seasonality, unspecified trigger Try fluticasone, azelastine, loratadine to assist with controlling allergy symptoms due to pollen. May improve COPD and lung function if allergies are under control.  - fluticasone (FLONASE) 50 MCG/ACT nasal spray; Place 2 sprays into both nostrils daily.  Dispense: 16 g; Refill: 12 - azelastine (ASTELIN) 0.1 % nasal spray; Place 2 sprays into both nostrils 2 (two) times daily. Use in each nostril as directed  Dispense: 30 mL; Refill: 0 - loratadine (CLARITIN) 10 MG tablet; Take 1 tablet (10 mg total) by mouth daily.  Dispense: 30 tablet; Refill: 11  3. Need for pneumococcal vaccination Necessary due to patient's age and chronic condition.  - Pneumococcal conjugate vaccine 13-valent  Return in about 1 month (around 02/03/2018) for re-evalaution of allergies and breathing, with repeat office spirometry.

## 2018-01-06 NOTE — Patient Instructions (Addendum)
STOP the Qvar. START the Advair, try it twice a day. Make sure that you brush your teeth or rinse your mouth after using it. Start the allergy medications: Flonase, azelastine, loratadine    IF you received an x-ray today, you will receive an invoice from [facility]. Please contact [facility] at [phone] with questions or concerns regarding your invoice.   IF you received labwork today, you will receive an invoice from [place]. Please contact [facility] at [phone] with questions or concerns regarding your invoice.   Our billing staff will not be able to assist you with questions regarding bills from these companies.  You will be contacted with the lab results as soon as they are available. The fastest way to get your results is to activate your My Chart account. Instructions are located on the last page of this paperwork. If you have not heard from [place] regarding the results in 2 weeks, please contact this office.    Did you know that you begin to benefit from quitting smoking within the first twenty minutes? It's TRUE.  At 20 minutes: -blood pressure decreases -pulse rate drops -body temperature of hands and feet increases  At 8 hours: -carbon monoxide level in blood drops to normal -oxygen level in blood increases to normal  At 24 hours: -the chance of heart attack decreases  At 48 hours: -nerve endings start regrowing -ability to smell and taste is enhanced  2 weeks-3 months: -circulation improves -walking becomes easier -lung function improves  1-9 months: -coughing, sinus congestion, fatigue and shortness of breath decreases  1 year: -excess risk of heart disease is decreased to HALF that of a smoker  5 years: Stroke risk is reduced to that of people who have never smoked  10 years: -risk of lung cancer drops to as little as half that of continuing smokers -risk of cancer of the mouth, throat, esophagus, bladder, kidney and pancreas  decreases -risk of ulcer decreases  15 years -risk of heart disease is now similar to that of people who have never smoked -risk of death returns to nearly the level of people who have never smoked

## 2018-01-11 NOTE — Assessment & Plan Note (Signed)
Continue efforts toward smoking cessation.

## 2018-01-11 NOTE — Assessment & Plan Note (Signed)
Did not tolerate twice daily beclomethasone and daily dosing does not seem helpful to her.  Stop beclomethasone.  Start Advair twice daily, if tolerated.  Continue efforts towards smoking cessation and management of allergy type symptoms.

## 2018-02-05 ENCOUNTER — Other Ambulatory Visit: Payer: Self-pay

## 2018-02-05 ENCOUNTER — Encounter: Payer: Self-pay | Admitting: Physician Assistant

## 2018-02-05 ENCOUNTER — Ambulatory Visit: Payer: Medicare Other | Admitting: Physician Assistant

## 2018-02-05 VITALS — BP 102/60 | HR 82 | Temp 99.0°F | Resp 16 | Ht 63.39 in | Wt 118.2 lb

## 2018-02-05 DIAGNOSIS — J42 Unspecified chronic bronchitis: Secondary | ICD-10-CM

## 2018-02-05 DIAGNOSIS — F17200 Nicotine dependence, unspecified, uncomplicated: Secondary | ICD-10-CM

## 2018-02-05 MED ORDER — FLUTICASONE-SALMETEROL 250-50 MCG/DOSE IN AEPB: 1.0000 | INHALATION_SPRAY | Freq: Two times a day (BID) | RESPIRATORY_TRACT | 12 refills | Status: DC

## 2018-02-05 NOTE — Progress Notes (Signed)
Patient ID: [name], female    DOB: 1951-08-18, 67 y.o.   MRN: [id number]  PCP: [name], PA-C  Chief Complaint  Patient presents with  . Allergies    follow up  . COPD    follow up     Subjective:   Presents for evaluation of allergies and COPD.  At her last visit, we changed from betamethasone inhaler to Advair, due to inability to tolerate BID dosing (Headache and sore throat), and lack of effectiveness with QD dosing. She was continuing to work on smoking cessation.  Tolerating the Advair much better. Uses albuterol rescue 3-4 times/week. Wonders whether or not she really needs the Claritin. Takes it at bedtime due to grogginess. She reports no sinus or nasal symptoms at this time.    Review of Systems  Constitutional: Negative.   HENT: Negative for sore throat.   Eyes: Negative for visual disturbance.  Respiratory: Positive for cough and shortness of breath. Negative for chest tightness and wheezing.   Cardiovascular: Negative for chest pain and palpitations.  Gastrointestinal: Negative for abdominal pain, diarrhea, nausea and vomiting.  Genitourinary: Negative for dysuria, frequency, hematuria and urgency.  Musculoskeletal: Negative for arthralgias and myalgias.  Skin: Negative for rash.  Neurological: Negative for dizziness, weakness and headaches.  Psychiatric/Behavioral: Negative for decreased concentration. The patient is not nervous/anxious.        Patient Active Problem List   Diagnosis Date Noted  . COPD (chronic obstructive pulmonary disease) ([place]) 11/04/2017  . Wears partial dentures 09/15/2017  . Smoker 09/15/2017     Prior to Admission medications   Medication Sig Start Date End Date Taking? Authorizing Provider  albuterol (PROVENTIL HFA;VENTOLIN HFA) 108 (90 Base) MCG/ACT inhaler Inhale 2 puffs into the lungs every 4 (four) hours as needed for wheezing or shortness of breath (cough, shortness of breath or wheezing.). 10/29/17   Yes Sharlize Hoar, PA-C  azelastine (ASTELIN) 0.1 % nasal spray Place 2 sprays into both nostrils 2 (two) times daily. Use in each nostril as directed 01/06/18  Yes Tomekia Helton, PA-C  fluticasone (FLONASE) 50 MCG/ACT nasal spray Place 2 sprays into both nostrils daily. 01/06/18  Yes Bandon Sherwin, PA-C  Fluticasone-Salmeterol (ADVAIR DISKUS) 100-50 MCG/DOSE AEPB Inhale 1 puff into the lungs 2 (two) times daily. 01/06/18  Yes Caleigh Rabelo, PA-C  loratadine (CLARITIN) 10 MG tablet Take 1 tablet (10 mg total) by mouth daily. 01/06/18  Yes [name], PA-C  Spacer/Aero-Holding Chambers (AEROCHAMBER PLUS) inhaler Use as instructed Patient not taking: Reported on 02/05/2018 11/04/17   [name], PA-C     No Known Allergies     Objective:  Physical Exam  Constitutional: She is oriented to person, place, and time. She appears well-developed and well-nourished. She is active and cooperative. No distress.  BP 102/60   Pulse 82   Temp 99 F (37.2 C)   Resp 16   Ht 5' 3.39" (1.61 m)   Wt 118 lb 3.2 oz (53.6 kg)   SpO2 93%   BMI 20.68 kg/m   HENT:  Head: Normocephalic and atraumatic.  Right Ear: Hearing normal.  Left Ear: Hearing normal.  Eyes: Conjunctivae are normal. No scleral icterus.  Neck: Normal range of motion. Neck supple. No thyromegaly present.  Cardiovascular: Normal rate, regular rhythm and normal heart sounds.  Pulses:      Radial pulses are 2+ on the right side, and 2+ on the left side.  Pulmonary/Chest: Effort normal and breath sounds normal.  Lymphadenopathy:       Head (right side): No tonsillar, no preauricular, no posterior auricular and no occipital adenopathy present.       Head (left side): No tonsillar, no preauricular, no posterior auricular and no occipital adenopathy present.    She has no cervical adenopathy.       Right: No supraclavicular adenopathy present.       Left: No supraclavicular adenopathy present.  Neurological: She is alert and  oriented to person, place, and time. No sensory deficit.  Skin: Skin is warm, dry and intact. No rash noted. No cyanosis or erythema. Nails show no clubbing.  Psychiatric: She has a normal mood and affect. Her speech is normal and behavior is normal.       Office Spirometry Results: 02/05/2018 FEV1: 1.09 liters FVC: 1.87 liters FEV1/FVC: 58.3 % FVC  % Predicted: 61 % FEV % Predicted: 47 % FeF 25-75: 0.57 liters FeF 25-75 % Predicted: 29  Office Spirometry Results: 01/06/2018 FEV1: 1.03 liters FVC: 2.04 liters FEV1/FVC: 50.5 % FVC  % Predicted: 67 % FEV % Predicted: 45 % FeF 25-75: 0.33 liters FeF 25-75 % Predicted: 16  Office Spirometry Results: 12/04/2017 FEV1: 1.02 liters FVC: 1.96 liters FEV1/FVC: 52 % FVC % Predicted: 66 % FEV % Predicted: 68 % FeF 25-75: 0.45 liters FeF 25-75 % Predicted: 23     Assessment & Plan:   Problem List Items Addressed This Visit    Smoker    Continue efforts for smoking cessation.      COPD (chronic obstructive pulmonary disease) ([facility]) - Primary    Symptomatically, continues to improve.  Minimal improvement in FEV1.  Slight decline in FVC.  Improvement in FEF 25-75.  Increase Advair to 250/51 puff twice daily.  She may discontinue loratadine, resume if needs increased albuterol rescue or develops recurrent nasal/sinus symptoms.      Relevant Medications   Fluticasone-Salmeterol (ADVAIR DISKUS) 250-50 MCG/DOSE AEPB       Return in about 2 months (around 04/07/2018) for re-evaluation of breathing.   Fara Chute, PA-C Primary Care at [facility]

## 2018-02-05 NOTE — Patient Instructions (Addendum)
Go ahead and call [facility] to schedule your next visit with me there. [phone].   IF you received an x-ray today, you will receive an invoice from [facility]. Please contact [facility] at [phone] with questions or concerns regarding your invoice.   IF you received labwork today, you will receive an invoice from [facility]. Please contact [facility] at [phone] with questions or concerns regarding your invoice.   Our billing staff will not be able to assist you with questions regarding bills from these companies.  You will be contacted with the lab results as soon as they are available. The fastest way to get your results is to activate your My Chart account. Instructions are located on the last page of this paperwork. If you have not heard from us regarding the results in 2 weeks, please contact this office.     

## 2018-02-08 NOTE — Assessment & Plan Note (Addendum)
Symptomatically, continues to improve.  Minimal improvement in FEV1.  Slight decline in FVC.  Improvement in FEF 25-75.  Increase Advair to 250/51 puff twice daily.  She may discontinue loratadine, resume if needs increased albuterol rescue or develops recurrent nasal/sinus symptoms.

## 2018-02-08 NOTE — Assessment & Plan Note (Signed)
Continue efforts for smoking cessation.

## 2018-02-11 ENCOUNTER — Ambulatory Visit
Admission: RE | Admit: 2018-02-11 | Discharge: 2018-02-11 | Disposition: A | Discharge status: Home / Self Care | Payer: Medicare Other | Source: Ambulatory Visit | Attending: Physician Assistant | Admitting: Physician Assistant

## 2018-02-11 DIAGNOSIS — Z1231 Encounter for screening mammogram for malignant neoplasm of breast: Secondary | ICD-10-CM

## 2018-02-11 DIAGNOSIS — E2839 Other primary ovarian failure: Secondary | ICD-10-CM

## 2018-02-11 IMAGING — MG DIGITAL SCREENING BILATERAL MAMMOGRAM WITH TOMO AND CAD
8 series · 9 of 24 positions shown · non-contrast
Comparison: None.

CLINICAL DATA: Screening.

EXAM:
DIGITAL SCREENING BILATERAL MAMMOGRAM WITH TOMO AND CAD

[R CC synth-2D]
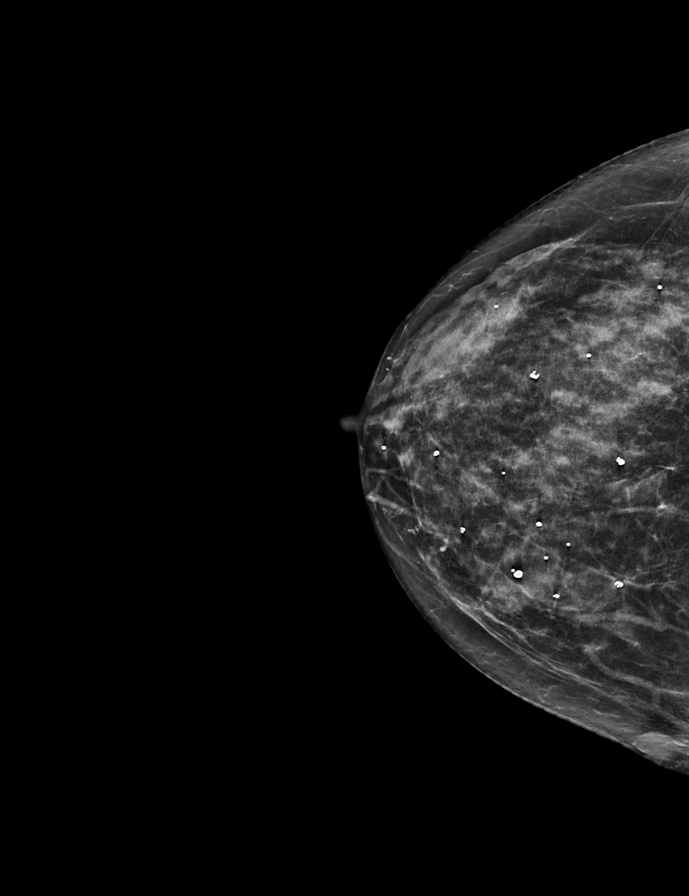

[L MLO synth-2D]
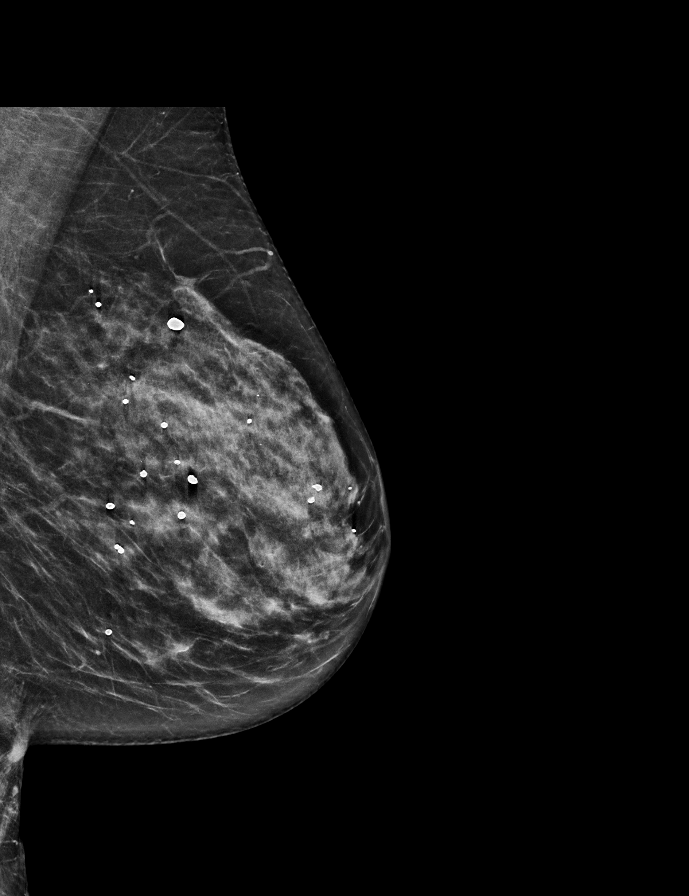

[L CC synth-2D]
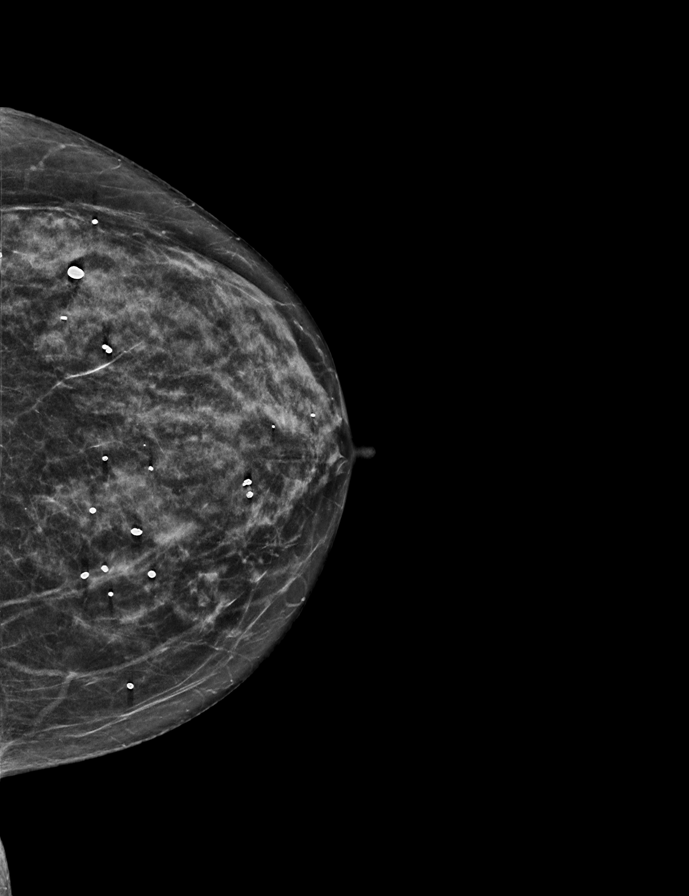

[R MLO synth-2D]
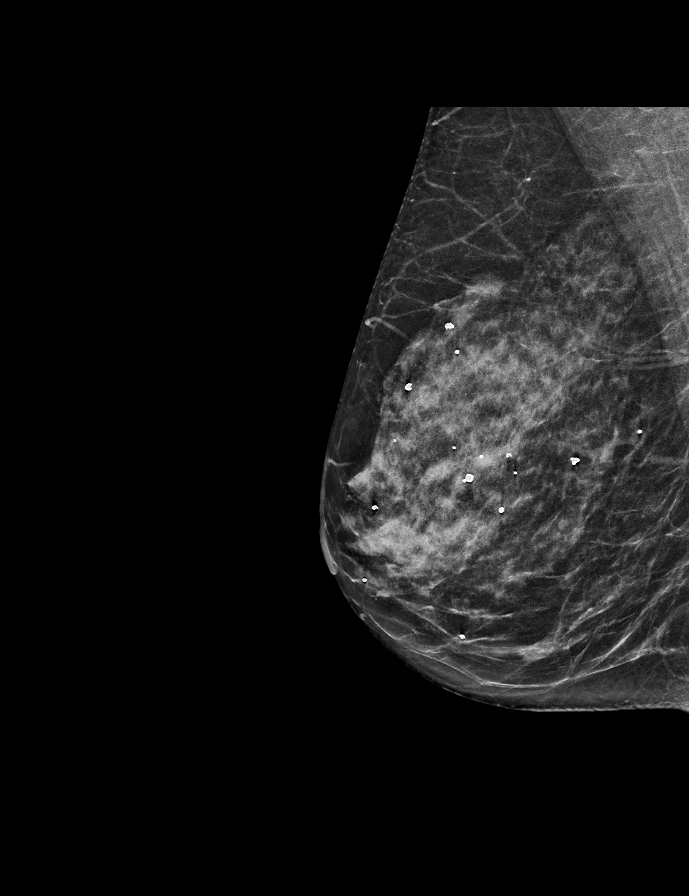

[R CC tomo · 2 of 51 frames shown]
[frame 17/51]
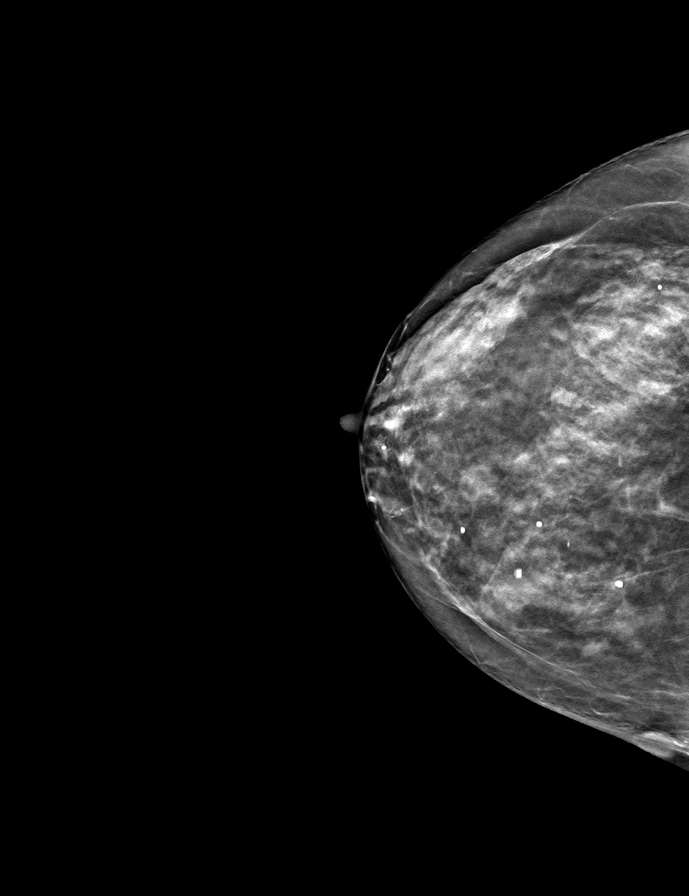
[frame 26/51]
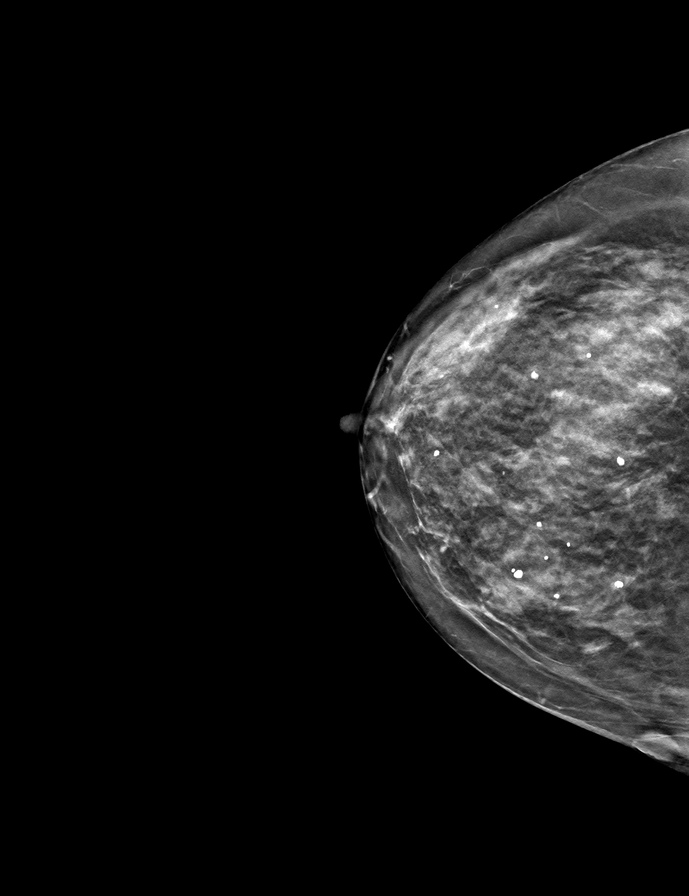

[L CC tomo · tomo slice 27/52.0]
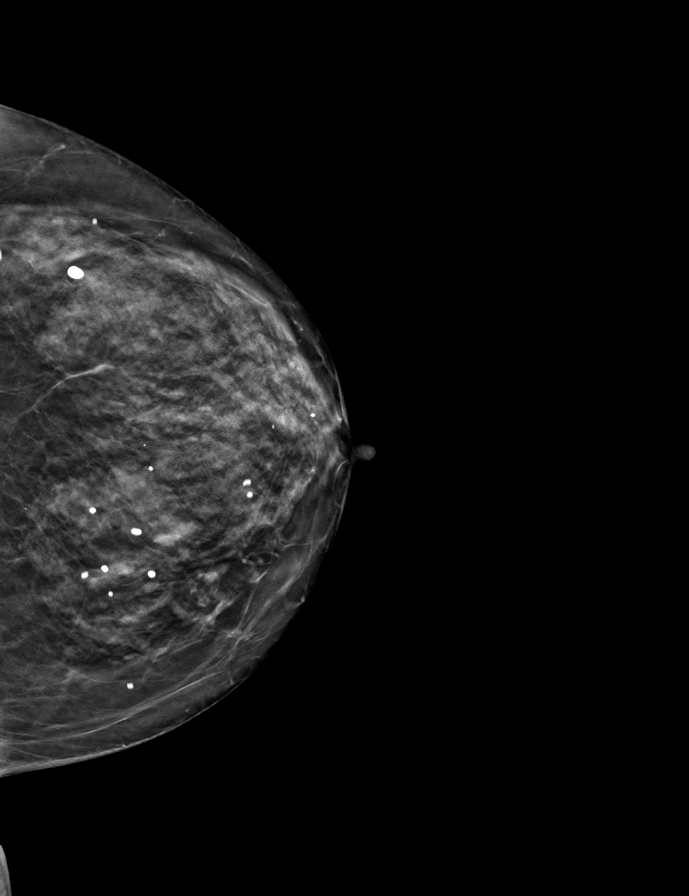

[L MLO tomo · tomo slice 29/57.0]
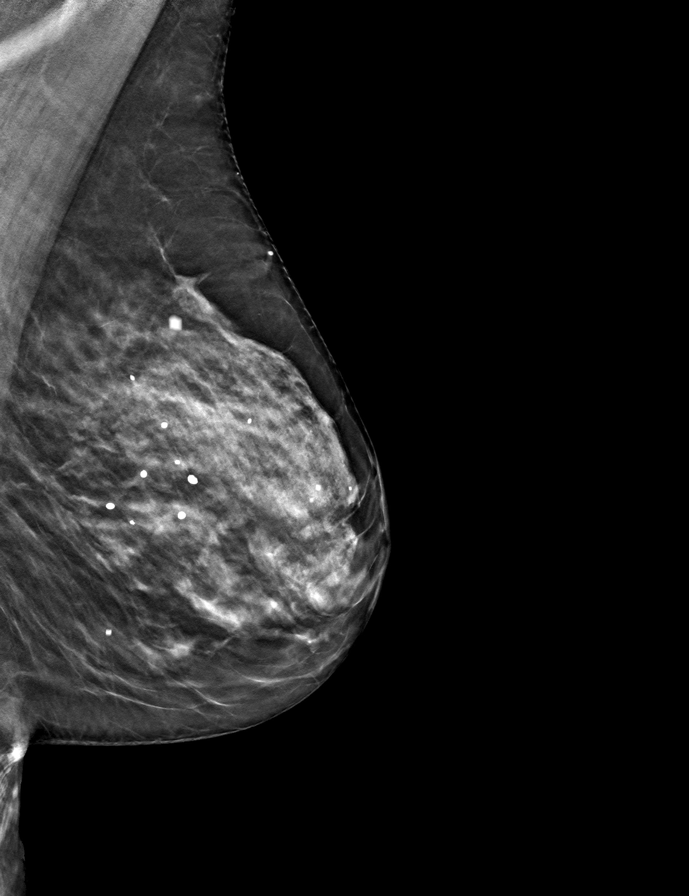

[R MLO tomo · tomo slice 27/54.0]
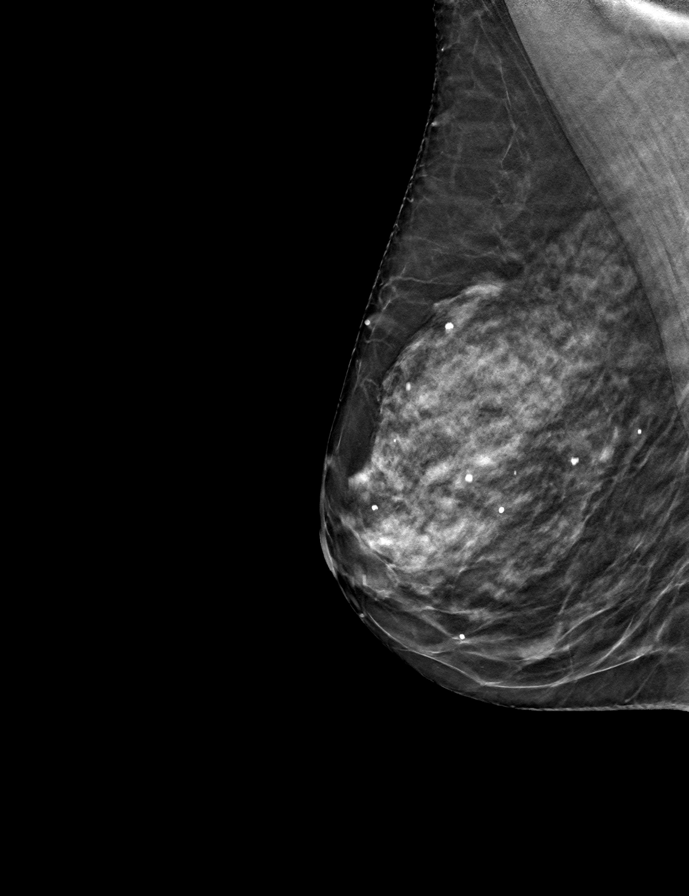

[9 of 24 positions shown; findings below may reference images not displayed]

ACR Breast Density Category c: The breast tissue is heterogeneously
dense, which may obscure small masses
FINDINGS: There are no findings suspicious for malignancy. Images were
processed with CAD.
IMPRESSION: No mammographic evidence of malignancy. A result letter of this
screening mammogram will be mailed directly to the patient.

RECOMMENDATION:
Screening mammogram in one year. (Code:[4W])

BI-RADS CATEGORY  1: Negative.

## 2018-02-15 ENCOUNTER — Encounter: Payer: Self-pay | Admitting: Physician Assistant

## 2018-02-15 DIAGNOSIS — M8580 Other specified disorders of bone density and structure, unspecified site: Secondary | ICD-10-CM | POA: Insufficient documentation

## 2018-04-07 ENCOUNTER — Telehealth: Payer: Self-pay | Admitting: Family Medicine

## 2018-04-07 DIAGNOSIS — R195 Other fecal abnormalities: Secondary | ICD-10-CM

## 2018-04-07 NOTE — Telephone Encounter (Signed)
Call --- I received a communication from [facility] advising me that patient recently completed a stool fecal immunochemical test; the test was POSITIVE for blood in the stool.  This letter was also sent to the patient on 03/14/18.  We recommend undergoing a colonoscopy if there is blood present in the stool on this screening.  Is patient agreeable?

## 2018-04-10 NOTE — Telephone Encounter (Signed)
I called and talked with pt.  Pt states understand - Yes, patient is agreeable

## 2018-04-14 NOTE — Telephone Encounter (Signed)
Referral to GI placed on 04/07/18.

## 2018-04-15 DIAGNOSIS — R195 Other fecal abnormalities: Secondary | ICD-10-CM | POA: Insufficient documentation

## 2018-04-21 ENCOUNTER — Encounter: Payer: Self-pay | Admitting: Gastroenterology

## 2018-06-03 ENCOUNTER — Encounter: Payer: Self-pay | Admitting: Gastroenterology

## 2018-06-03 ENCOUNTER — Ambulatory Visit (AMBULATORY_SURGERY_CENTER): Payer: Self-pay | Admitting: *Deleted

## 2018-06-03 VITALS — Ht 64.0 in | Wt 122.0 lb

## 2018-06-03 DIAGNOSIS — R195 Other fecal abnormalities: Secondary | ICD-10-CM

## 2018-06-03 NOTE — Progress Notes (Signed)
Patient denies any allergies to eggs or soy. Patient denies any problems with anesthesia/sedation. Patient denies any oxygen use at home. Patient denies taking any diet/weight loss medications or blood thinners. EMMI education assisgned to patient on colonoscopy, this was explained and instructions given to patient. 

## 2018-06-17 ENCOUNTER — Ambulatory Visit (AMBULATORY_SURGERY_CENTER): Payer: Medicare Other | Admitting: Gastroenterology

## 2018-06-17 ENCOUNTER — Encounter: Payer: Self-pay | Admitting: Gastroenterology

## 2018-06-17 VITALS — BP 126/59 | HR 65 | Temp 97.5°F | Resp 16 | Ht 64.0 in | Wt 122.0 lb

## 2018-06-17 DIAGNOSIS — R195 Other fecal abnormalities: Secondary | ICD-10-CM | POA: Diagnosis not present

## 2018-06-17 DIAGNOSIS — D129 Benign neoplasm of anus and anal canal: Secondary | ICD-10-CM

## 2018-06-17 DIAGNOSIS — D128 Benign neoplasm of rectum: Secondary | ICD-10-CM

## 2018-06-17 DIAGNOSIS — K621 Rectal polyp: Secondary | ICD-10-CM

## 2018-06-17 MED ORDER — SODIUM CHLORIDE 0.9 % IV SOLN: 500.0000 mL | Freq: Once | INTRAVENOUS | Status: DC

## 2018-06-17 NOTE — Op Note (Signed)
[facility] Patient Name: [name] Procedure Date: 06/17/2018 9:49 AM MRN: [id number] Endoscopist: Justice Britain , MD Age: 67 Referring MD:  Date of Birth: [birth year] Gender: Female Account #: [id number] Procedure:                Colonoscopy Indications:              Screening for colorectal malignant neoplasm,                            Personal history of digestive disease, Incidental -                            Diverticulosis of the colon Medicines:                Monitored Anesthesia Care Procedure:                Pre-Anesthesia Assessment:                           - Prior to the procedure, a History and Physical                            was performed, and patient medications and                            allergies were reviewed. The patient's tolerance of                            previous anesthesia was also reviewed. The risks                            and benefits of the procedure and the sedation                            options and risks were discussed with the patient.                            All questions were answered, and informed consent                            was obtained. Prior Anticoagulants: The patient has                            taken no previous anticoagulant or antiplatelet                            agents. ASA Grade Assessment: II - A patient with                            mild systemic disease. After reviewing the risks                            and benefits, the patient was deemed in  satisfactory condition to undergo the procedure.                           After obtaining informed consent, the colonoscope                            was passed under direct vision. Throughout the                            procedure, the patient's blood pressure, pulse, and                            oxygen saturations were monitored continuously. The                            Colonoscope was introduced  through the anus and                            advanced to the 5 cm into the ileum. The                            colonoscopy was performed without difficulty. The                            patient tolerated the procedure. The quality of the                            bowel preparation was good. Scope In: 9:54:44 AM Scope Out: 10:08:45 AM Scope Withdrawal Time: 0 hours 10 minutes 41 seconds  Total Procedure Duration: 0 hours 14 minutes 1 second  Findings:                 The digital rectal exam findings include                            non-thrombosed external hemorrhoids. Pertinent                            negatives include no palpable rectal lesions.                           The terminal ileum and ileocecal valve appeared                            normal.                           Three sessile polyps were found in the rectum. The                            polyps were 1 to 2 mm in size. These polyps were                            removed with a jumbo cold forceps. Resection and  retrieval were complete. These have appearance of                            hyperplastic polyps.                           Many small and large-mouthed diverticula were found                            in the recto-sigmoid colon, sigmoid colon and                            descending colon.                           Normal mucosa was found in the entire colon                            otherwise.                           Non-bleeding non-thrombosed external and internal                            hemorrhoids were found during retroflexion and                            during perianal exam. The hemorrhoids were Grade I                            (internal hemorrhoids that do not prolapse). Complications:            No immediate complications. Estimated Blood Loss:     Estimated blood loss was minimal. Impression:               - Non-thrombosed external hemorrhoids found  on                            digital rectal exam.                           - The examined portion of the ileum was normal.                           - Three 1 to 2 mm polyps in the rectum, removed                            with a jumbo cold forceps. Resected and retrieved.                           - Diverticulosis in the recto-sigmoid colon, in the                            sigmoid colon and in the descending colon.                           -  Normal mucosa in the entire examined colon.                           - Non-bleeding non-thrombosed external and internal                            hemorrhoids. Recommendation:           - The patient will be observed post-procedure,                            until all discharge criteria are met.                           - Discharge patient to home.                           - Patient has a contact number available for                            emergencies. The signs and symptoms of potential                            delayed complications were discussed with the                            patient. Return to normal activities tomorrow.                            Written discharge instructions were provided to the                            patient.                           - High fiber diet.                           - Continue present medications.                           - Await pathology results.                           - Repeat colonoscopy in 11/18/08 years for                            surveillance based on pathology results and                            findings of adenomatous tissue.                           - The findings and recommendations were discussed                            with the patient.                           -  The findings and recommendations were discussed                            with the patient's family. Justice Britain, MD 06/17/2018 10:20:46 AM

## 2018-06-17 NOTE — Progress Notes (Signed)
No change in medical history.

## 2018-06-17 NOTE — Progress Notes (Signed)
Report to PACU, RN, vss, BBS= Clear.  

## 2018-06-17 NOTE — Progress Notes (Signed)
Called to room to assist during endoscopic procedure.  Patient ID and intended procedure confirmed with present staff. Received instructions for my participation in the procedure from the performing physician.  

## 2018-06-17 NOTE — Patient Instructions (Signed)
Please read handouts provided. Continue present medications. Await pathology results.     YOU HAD AN ENDOSCOPIC PROCEDURE TODAY AT THE [facility]:   Refer to the procedure report that was given to you for any specific questions about what was found during the examination.  If the procedure report does not answer your questions, please call your gastroenterologist to clarify.  If you requested that your care partner not be given the details of your procedure findings, then the procedure report has been included in a sealed envelope for you to review at your convenience later.  YOU SHOULD EXPECT: Some feelings of bloating in the abdomen. Passage of more gas than usual.  Walking can help get rid of the air that was put into your GI tract during the procedure and reduce the bloating. If you had a lower endoscopy (such as a colonoscopy or flexible sigmoidoscopy) you may notice spotting of blood in your stool or on the toilet paper. If you underwent a bowel prep for your procedure, you may not have a normal bowel movement for a few days.  Please Note:  You might notice some irritation and congestion in your nose or some drainage.  This is from the oxygen used during your procedure.  There is no need for concern and it should clear up in a day or so.  SYMPTOMS TO REPORT IMMEDIATELY:   Following lower endoscopy (colonoscopy or flexible sigmoidoscopy):  Excessive amounts of blood in the stool  Significant tenderness or worsening of abdominal pains  Swelling of the abdomen that is new, acute  Fever of 100F or higher    For urgent or emergent issues, a gastroenterologist can be reached at any hour by calling [phone].   DIET:  We do recommend a small meal at first, but then you may proceed to your regular diet.  Drink plenty of fluids but you should avoid alcoholic beverages for 24 hours.  ACTIVITY:  You should plan to take it easy for the rest of today and you should NOT DRIVE  or use heavy machinery until tomorrow (because of the sedation medicines used during the test).    FOLLOW UP: Our staff will call the number listed on your records the next business day following your procedure to check on you and address any questions or concerns that you may have regarding the information given to you following your procedure. If we do not reach you, we will leave a message.  However, if you are feeling well and you are not experiencing any problems, there is no need to return our call.  We will assume that you have returned to your regular daily activities without incident.  If any biopsies were taken you will be contacted by phone or by letter within the next 1-3 weeks.  Please call us at [phone] if you have not heard about the biopsies in 3 weeks.    SIGNATURES/CONFIDENTIALITY: You and/or your care partner have signed paperwork which will be entered into your electronic medical record.  These signatures attest to the fact that that the information above on your After Visit Summary has been reviewed and is understood.  Full responsibility of the confidentiality of this discharge information lies with you and/or your care-partner. 

## 2018-06-18 ENCOUNTER — Telehealth: Payer: Self-pay | Admitting: *Deleted

## 2018-06-18 NOTE — Telephone Encounter (Signed)
  Follow up Call-  Call back number 06/17/2018  Post procedure Call Back phone  # [phone]  Permission to leave phone message Yes  Some recent data might be hidden     Patient questions:  Do you have a fever, pain , or abdominal swelling? No. Pain Score  0 *  Have you tolerated food without any problems? Yes.    Have you been able to return to your normal activities? Yes.    Do you have any questions about your discharge instructions: Diet   No. Medications  No. Follow up visit  No.  Do you have questions or concerns about your Care? No.  Actions: * If pain score is 4 or above: No action needed, pain <4.

## 2018-06-25 ENCOUNTER — Encounter: Payer: Self-pay | Admitting: Gastroenterology

## 2019-06-22 ENCOUNTER — Other Ambulatory Visit: Payer: Self-pay | Admitting: Physician Assistant

## 2019-06-22 DIAGNOSIS — Z1231 Encounter for screening mammogram for malignant neoplasm of breast: Secondary | ICD-10-CM

## 2019-08-06 ENCOUNTER — Other Ambulatory Visit: Payer: Self-pay

## 2019-08-06 ENCOUNTER — Ambulatory Visit
Admission: RE | Admit: 2019-08-06 | Discharge: 2019-08-06 | Disposition: A | Discharge status: Home / Self Care | Payer: Medicare Other | Source: Ambulatory Visit | Attending: Physician Assistant | Admitting: Physician Assistant

## 2019-08-06 DIAGNOSIS — Z1231 Encounter for screening mammogram for malignant neoplasm of breast: Secondary | ICD-10-CM

## 2019-08-06 IMAGING — MG DIGITAL SCREENING BILAT W/ TOMO W/ CAD
8 series · 8 of 24 positions shown · non-contrast
Comparison: Previous exam(s).

CLINICAL DATA: Screening.

EXAM:
DIGITAL SCREENING BILATERAL MAMMOGRAM WITH TOMO AND CAD

[L CC synth-2D]
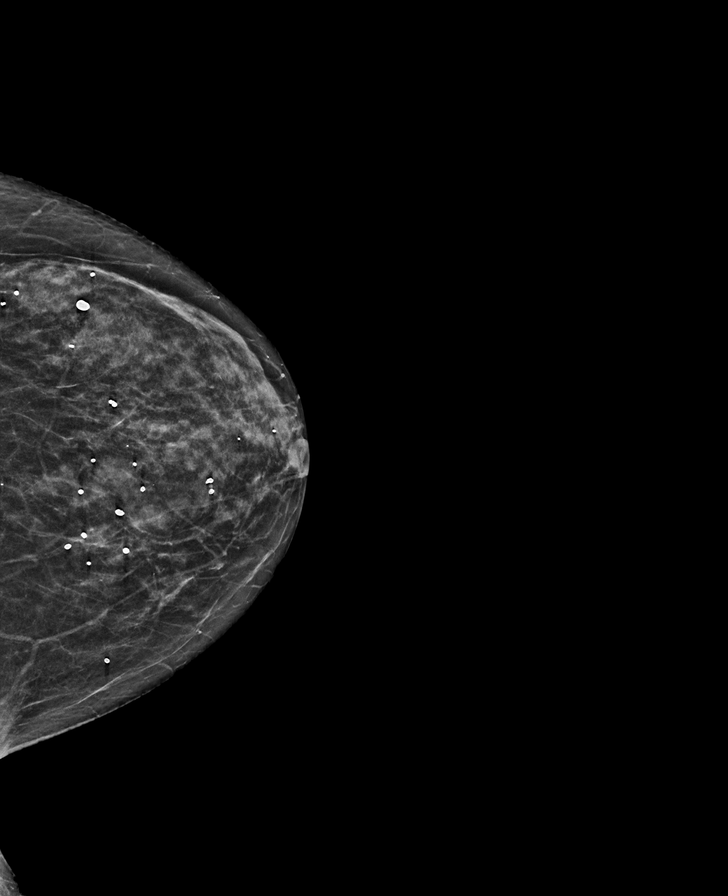

[R MLO synth-2D]
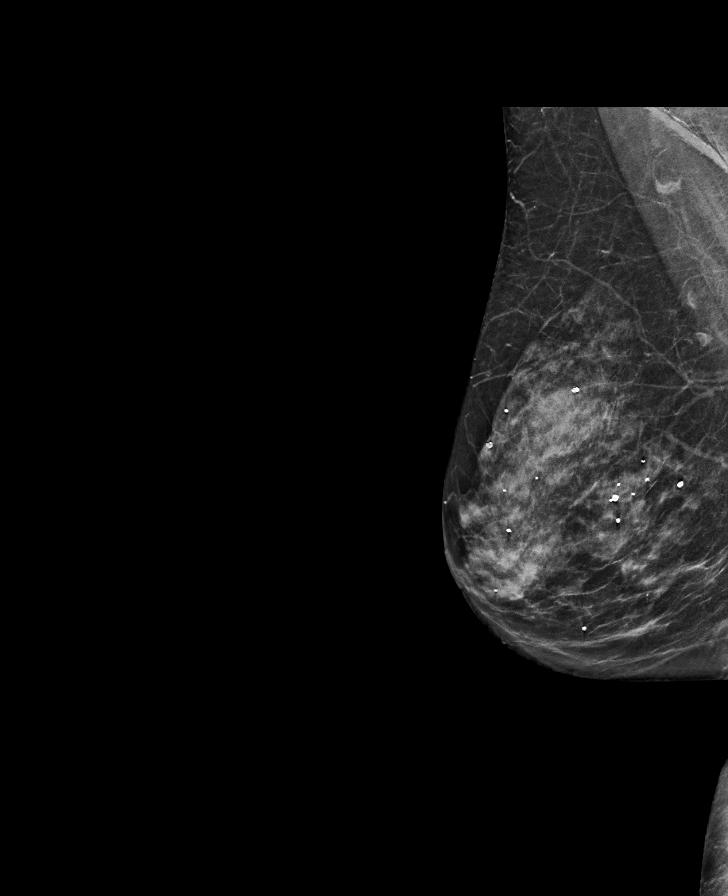

[L MLO synth-2D]
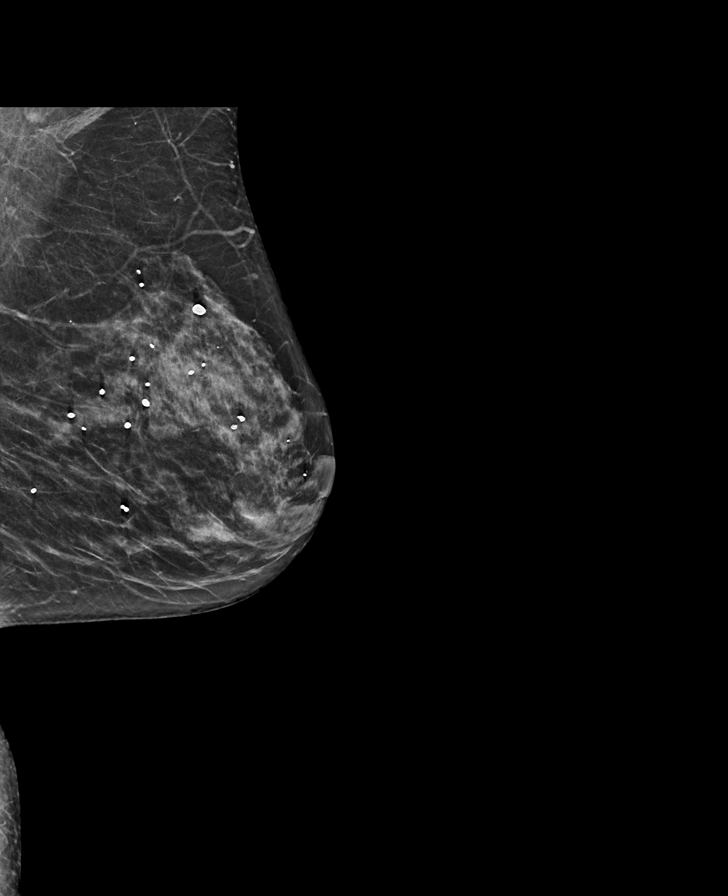

[R CC synth-2D]
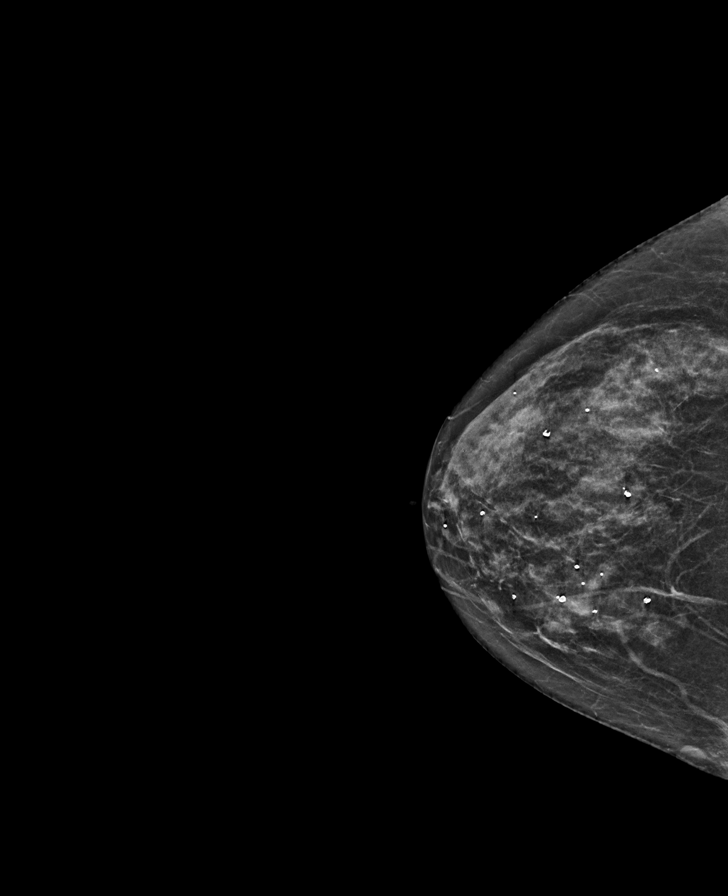

[R MLO tomo · tomo slice 30/59.0]
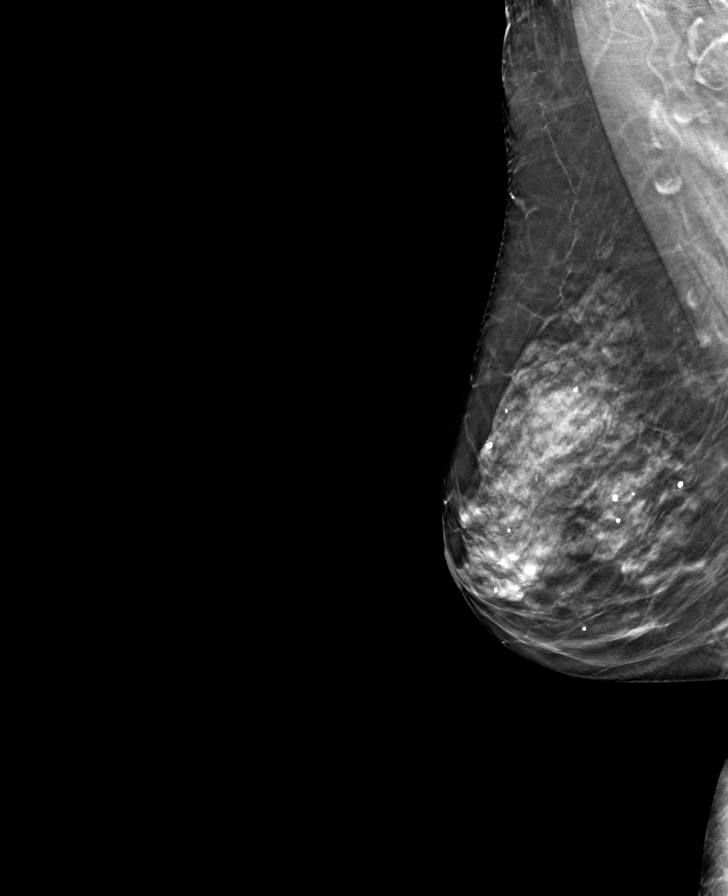

[R CC tomo · tomo slice 27/54.0]
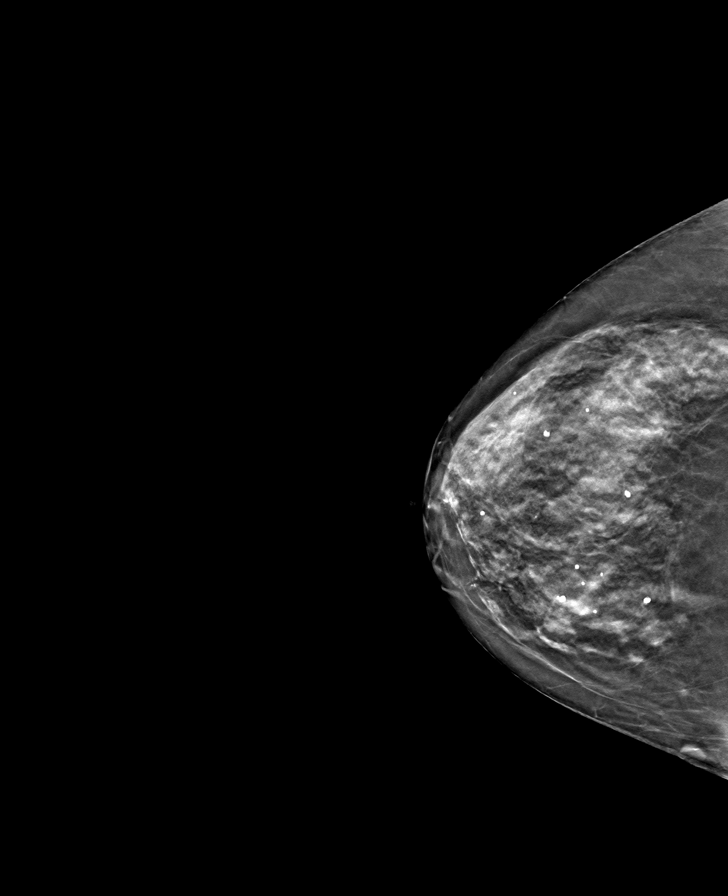

[L CC tomo · tomo slice 25/49.0]
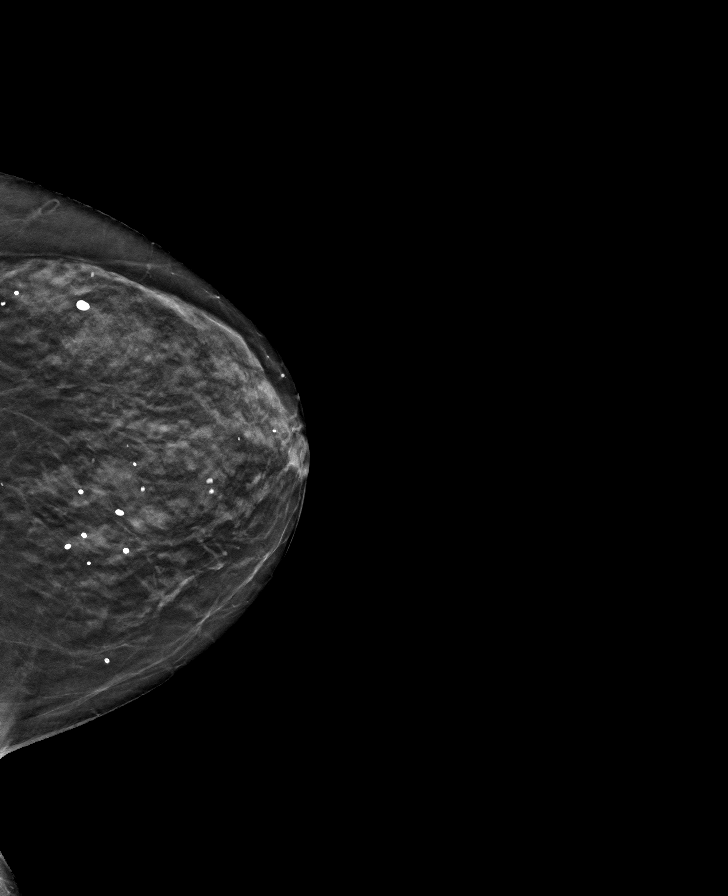

[L MLO tomo · tomo slice 29/56.0]
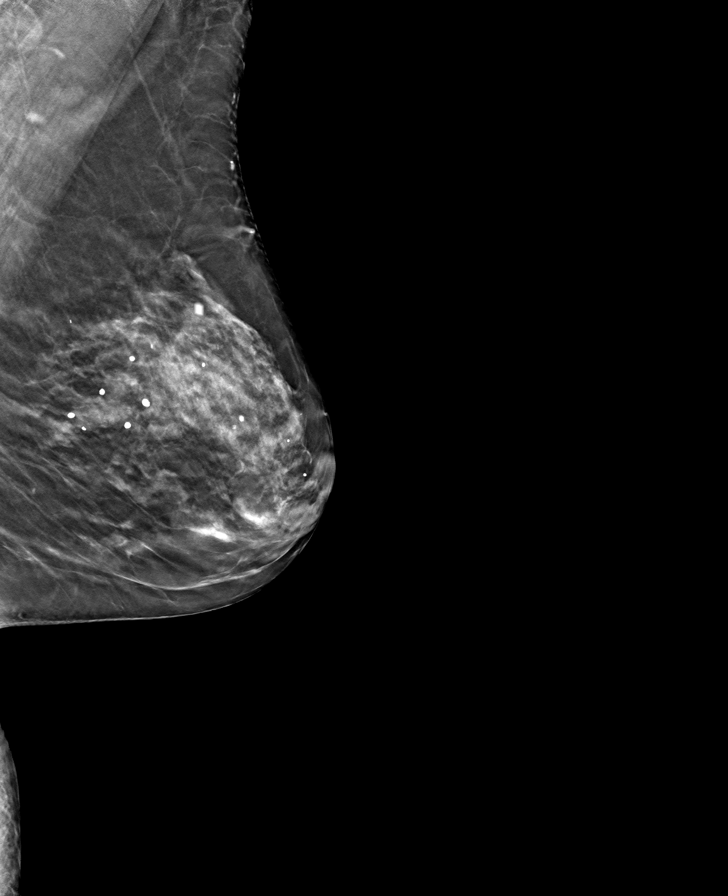

[8 of 24 positions shown; findings below may reference images not displayed]

ACR Breast Density Category c: The breast tissue is heterogeneously
dense, which may obscure small masses.
FINDINGS: There are no findings suspicious for malignancy. Images were
processed with CAD.
IMPRESSION: No mammographic evidence of malignancy. A result letter of this
screening mammogram will be mailed directly to the patient.

RECOMMENDATION:
Screening mammogram in one year. (Code:[5V])

BI-RADS CATEGORY  1: Negative.

## 2020-02-23 ENCOUNTER — Other Ambulatory Visit: Payer: Self-pay | Admitting: Physician Assistant

## 2020-02-23 DIAGNOSIS — Z122 Encounter for screening for malignant neoplasm of respiratory organs: Secondary | ICD-10-CM

## 2020-03-10 ENCOUNTER — Ambulatory Visit
Admission: RE | Admit: 2020-03-10 | Discharge: 2020-03-10 | Disposition: A | Discharge status: Home / Self Care | Payer: Medicare Other | Source: Ambulatory Visit | Attending: Physician Assistant | Admitting: Physician Assistant

## 2020-03-10 DIAGNOSIS — Z122 Encounter for screening for malignant neoplasm of respiratory organs: Secondary | ICD-10-CM

## 2020-03-10 IMAGING — CT CT CHEST LUNG CANCER SCREENING LOW DOSE W/O CM
2 of 5 series · 15 of 40 positions shown, 18 images · non-contrast
Comparison: None.

CLINICAL DATA: 69-year-old female with 40 pack-year history of
smoking. Lung cancer screening.

EXAM:
CT CHEST WITHOUT CONTRAST LOW-DOSE FOR LUNG CANCER SCREENING
TECHNIQUE: Multidetector CT imaging of the chest was performed following the
standard protocol without IV contrast.

[Series 4: lung 1.00 br44 cor · coronal · 0.61mm/px · 3 of 247 slices shown]
[im 50/247  lung]
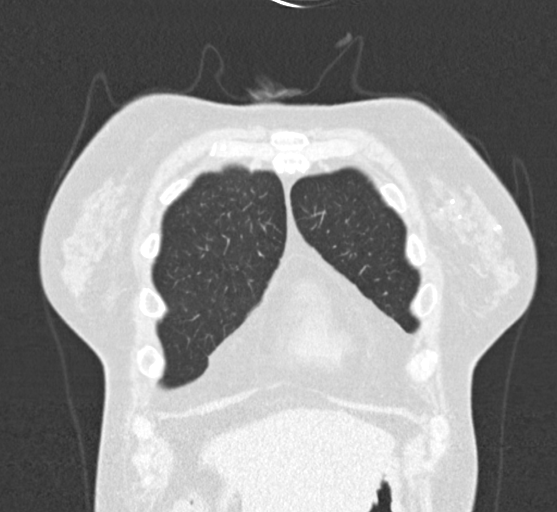
[im 99/247  lung]
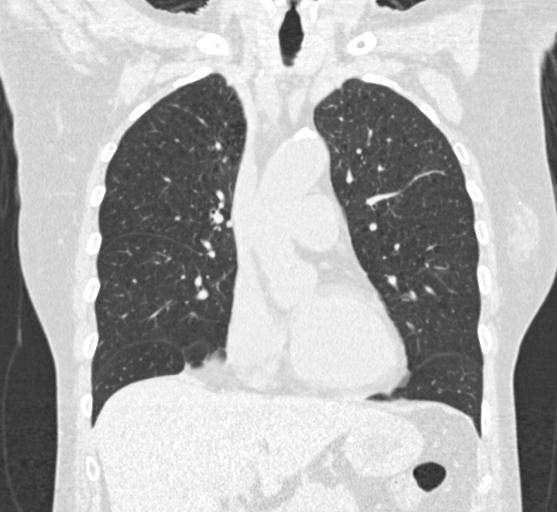
[im 148/247  lung]
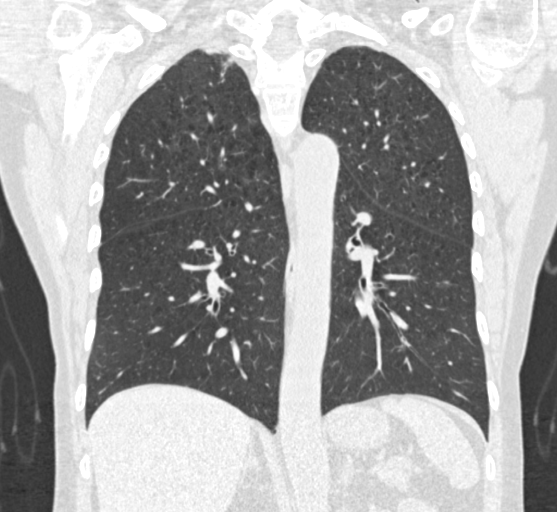

[Series 9: lung 1.00 br60 axial · axial · 0.67mm/px · z∈[-1050,-766]mm · 12 of 314 slices shown, 15 images]
[im 15/314  mediastinal]
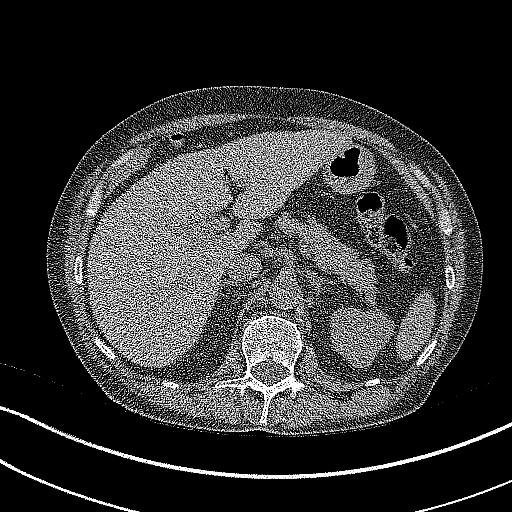
[im 15/314  lung]
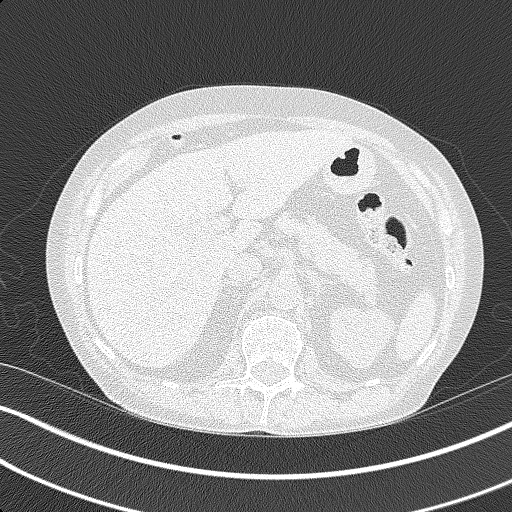
[im 43/314  lung]
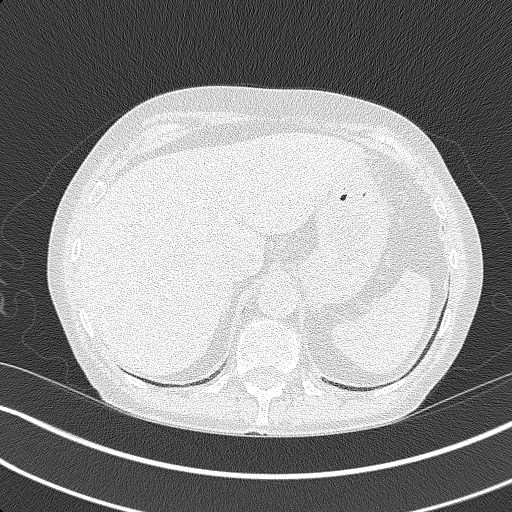
[im 72/314  lung]
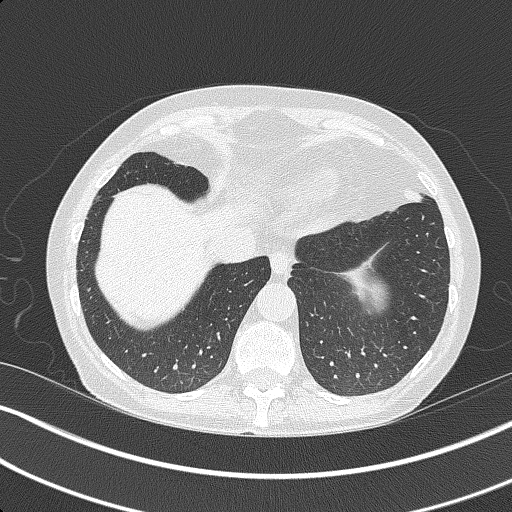
[im 100/314  lung]
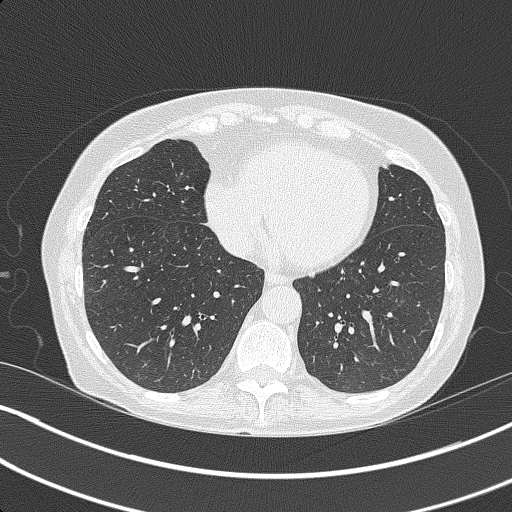
[im 114/314  mediastinal]
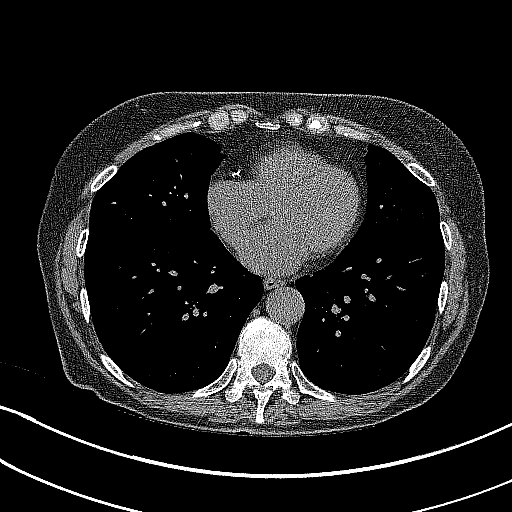
[im 114/314  lung]
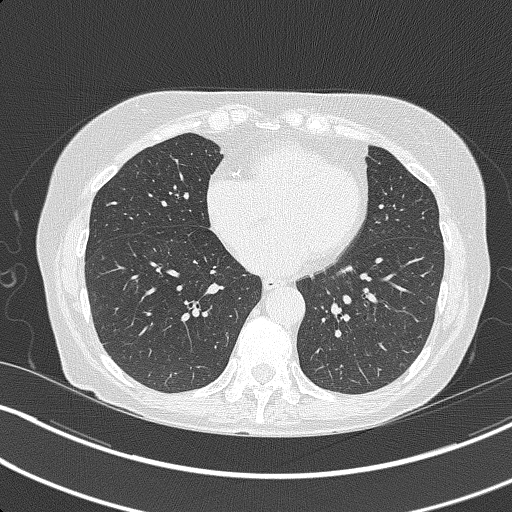
[im 143/314  lung]
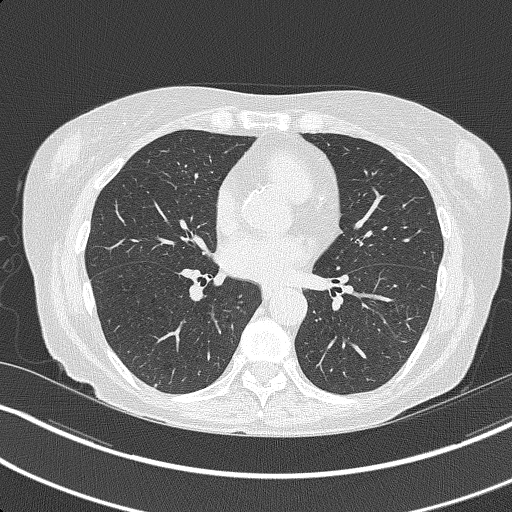
[im 171/314  lung]
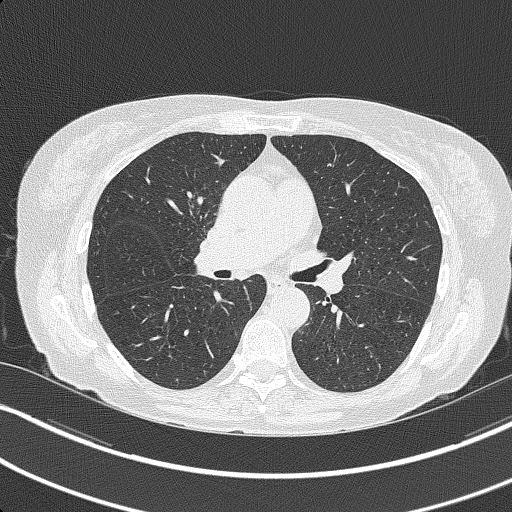
[im 200/314  lung]
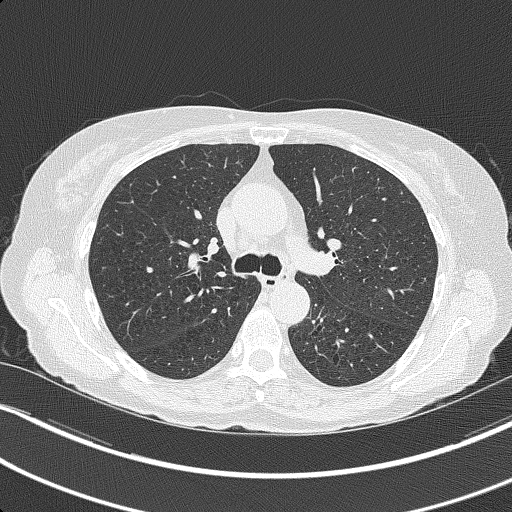
[im 214/314  mediastinal]
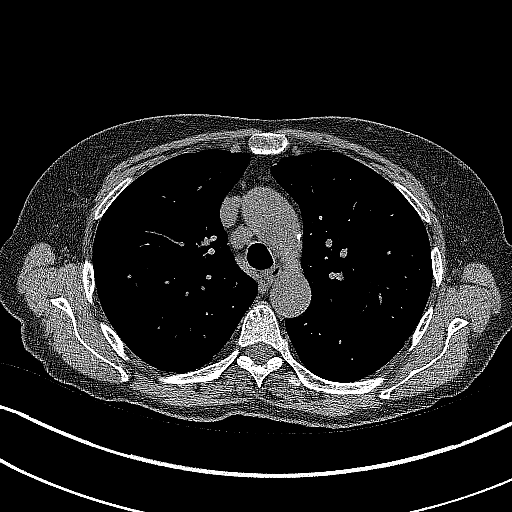
[im 214/314  lung]
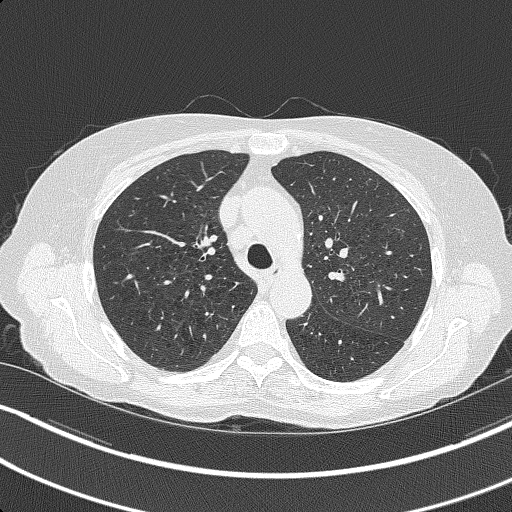
[im 242/314  lung]
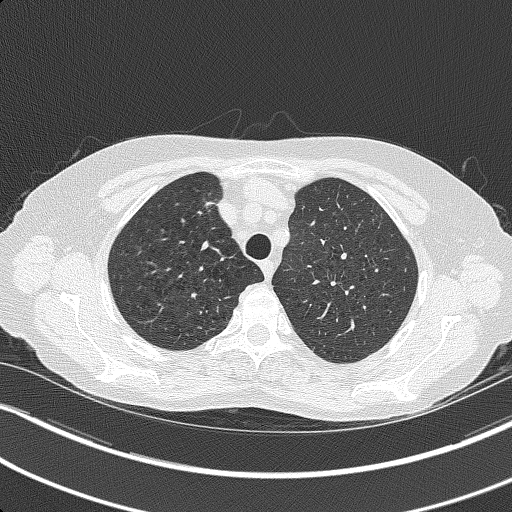
[im 271/314  lung]
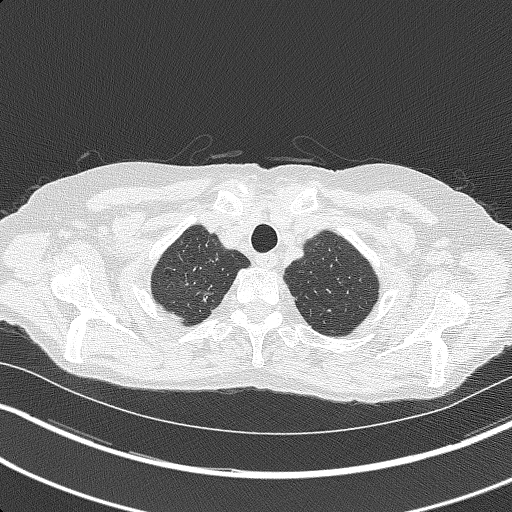
[im 299/314  lung]
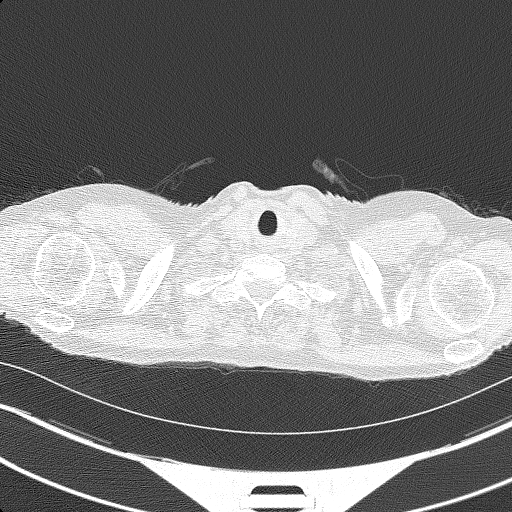

[15 of 40 positions shown; findings below may reference images not displayed]

FINDINGS: Cardiovascular: The heart size is normal. No substantial pericardial
effusion. Coronary artery calcification is evident. Atherosclerotic
calcification is noted in the wall of the thoracic aorta.

Mediastinum/Nodes: 13 mm short axis right paratracheal node
identified on [DATE]. Other scattered normal sized mediastinal lymph
nodes evident. No evidence for gross hilar lymphadenopathy although
assessment is limited by the lack of intravenous contrast on today's
study. The esophagus has normal imaging features. There is no
axillary lymphadenopathy.

Lungs/Pleura: Centrilobular emphsyema noted. Biapical
pleuroparenchymal scarring evident. 10.8 mm irregular pulmonary
nodule is identified in the medial right upper lobe image 80/series
9). 5 mm irregular nodule noted right middle lobe. No focal airspace
consolidation. No pleural effusion.

Upper Abdomen: Unremarkable.

Musculoskeletal: No worrisome lytic or sclerotic osseous
abnormality.
IMPRESSION: 1. Lung-RADS 4X, suspicious right upper lobe pulmonary lesion with
right paratracheal lymphadenopathy. Additional imaging evaluation
or consultation with Pulmonology or Thoracic Surgery recommended.
2. Aortic Atherosclerosis ([OM]-[OM]) and Emphysema ([OM]-[OM]).

These results will be called to the ordering clinician or
representative by the Radiologist Assistant, and communication
documented in the PACS or [REDACTED].

## 2020-03-15 ENCOUNTER — Telehealth: Payer: Self-pay | Admitting: Internal Medicine

## 2020-03-15 NOTE — Telephone Encounter (Signed)
Patient husband calling, advised to keep consult appointment on 03/29/2020 with Dr. Melvyn Novas. Patient is having cataract surgery on 03/30/2020.

## 2020-03-29 ENCOUNTER — Ambulatory Visit: Payer: Medicare Other | Admitting: Internal Medicine

## 2020-03-29 ENCOUNTER — Encounter: Payer: Self-pay | Admitting: Internal Medicine

## 2020-03-29 ENCOUNTER — Other Ambulatory Visit: Payer: Self-pay

## 2020-03-29 DIAGNOSIS — F17210 Nicotine dependence, cigarettes, uncomplicated: Secondary | ICD-10-CM

## 2020-03-29 DIAGNOSIS — R911 Solitary pulmonary nodule: Secondary | ICD-10-CM

## 2020-03-29 DIAGNOSIS — J449 Chronic obstructive pulmonary disease, unspecified: Secondary | ICD-10-CM

## 2020-03-29 NOTE — Assessment & Plan Note (Addendum)
>   3 min discussion re active cigarette smoking in addition to office E&M  Ask about tobacco use:   ongoing Advise quitting    I reviewed the Fletcher curve with the patient that basically indicates  if you quit smoking when your best day FEV1 is still well preserved (as is clearly  the case here)  it is highly unlikely you will progress to severe disease and informed the patient there was  no medication on the market that has proven to alter the curve/ its downward trajectory  or the likelihood of progression of their disease(unlike other chronic medical conditions such as atheroclerosis where we do think we can change the natural hx with risk reducing meds)    Therefore stopping smoking and maintaining abstinence are  the most important aspects of her care, not choice of inhalers or for that matter, doctors.   Treatment other than smoking cessation  is entirely directed by severity of symptoms and focused also on reducing exacerbations, not attempting to change the natural history of the disease.   Assess willingness:  Not committed at this point Assist in quit attempt:  Per PCP when ready Arrange follow up:   Follow up per Primary Care planned  For smoking cessation info  call [phone]

## 2020-03-29 NOTE — Assessment & Plan Note (Addendum)
Active smoker   Chest LDS 03/10/20  1. Lung-RADS 4X, suspicious right upper lobe pulmonary lesion medial x 10.8 mm with right paratracheal lymphadenopathy. - PET 03/29/2020 >>>   Barely at [facility] for PET but if pos needs consideration for excisional bx/ RULobectomy if Bronchogenic ca should be tolerated well but will need preop pfts if recent study  not available from PCP   Discussed in detail all the  indications, usual  risks and alternatives  relative to the benefits with patient who agrees to proceed with w/u as outlined.     Medical decision making was a moderate level of complexity in this case because of  two chronic conditions /diagnoses requiring extra time for  H and P, chart review, counseling,  and generating customized AVS unique to this office visit and charting.   Each maintenance medication was reviewed in detail including emphasizing most importantly the difference between maintenance and prns and under what circumstances the prns are to be triggered using an action plan format where appropriate. Please see avs for details which were reviewed in writing by both me and my nurse and patient given a written copy highlighted where appropriate with yellow highlighter for the patient's continued care at home along with an updated version of their medications.  Patient was asked to maintain medication reconciliation by comparing this list to the actual medications being used at home and to contact this office right away if there is a conflict or discrepancy.

## 2020-03-29 NOTE — Progress Notes (Signed)
[name], female    DOB: [birth year], 69 y.o.   MRN: [id number]   Brief patient profile:   34 yowf active smoker with acute flare cough >  pfts dx  "copd" around 2019 and started on advair by [facility] and cough resolved, no doe  then 1st screening CT [facility] 03/10/20  with SPN so referred to pulmonary clinic 03/29/2020 by [name] PA      History of Present Illness  03/29/2020  Pulmonary/ 1st office eval/Jadelynn Boylan s/p second [facility] 11/06/19  Chief Complaint  Patient presents with  . Consult  Dyspnea:  Not limited by breathing from desired activities  / walks dogs some hills  Cough: not much at all  Sleep: no problem flat bed / one pillow  SABA use: none No longer any rhinitis while on flonase daily   No obvious day to day or daytime variability or assoc excess/ purulent sputum or mucus plugs or hemoptysis or cp or chest tightness, subjective wheeze or overt sinus or hb symptoms.   Sleeping ok  without nocturnal  or early am exacerbation  of respiratory  c/o's or need for noct saba. Also denies any obvious fluctuation of symptoms with weather or environmental changes or other aggravating or alleviating factors except as outlined above   No unusual exposure hx or h/o childhood pna/ asthma or knowledge of premature birth.  Current Allergies, Complete Past Medical History, Past Surgical History, Family History, and Social History were reviewed in [employer] record.  ROS  The following are not active complaints unless bolded Hoarseness, sore throat, dysphagia, dental problems, itching, sneezing,  nasal congestion or discharge of excess mucus or purulent secretions, ear ache,   fever, chills, sweats, unintended wt loss or wt gain, classically pleuritic or exertional cp,  orthopnea pnd or arm/hand swelling  or leg swelling, presyncope, palpitations, abdominal pain, anorexia, nausea, vomiting, diarrhea  or change in bowel habits or change in bladder habits, change  in stools or change in urine, dysuria, hematuria,  rash, arthralgias, visual complaints, headache, numbness, weakness or ataxia or problems with walking or coordination,  change in mood or  memory.                Past Medical History:  Diagnosis Date  . Cataract   . COPD (chronic obstructive pulmonary disease) (HCC)     Outpatient Medications Prior to Visit  Medication Sig Dispense Refill  . albuterol (PROVENTIL HFA;VENTOLIN HFA) 108 (90 Base) MCG/ACT inhaler Inhale 2 puffs into the lungs every 4 (four) hours as needed for wheezing or shortness of breath (cough, shortness of breath or wheezing.). 1 Inhaler 1  . Calcium Carbonate (CALCIUM 600 PO) Take 1 tablet by mouth 2 (two) times daily.    . Cholecalciferol (VITAMIN D3 PO) Take 2,000 Units by mouth daily.    . fluticasone (FLONASE) 50 MCG/ACT nasal spray Place 2 sprays into both nostrils daily. 16 g 12  . Fluticasone-Salmeterol (ADVAIR) 250-50 MCG/DOSE AEPB Inhale 1 puff into the lungs 2 (two) times daily.     . Multiple Vitamin (MULTIVITAMIN) tablet Take 1 tablet by mouth daily.     No facility-administered medications prior to visit.     Objective:     BP 112/62 (BP Location: Left Arm, Cuff Size: Normal)   Pulse 66   Temp 98.6 F (37 C) (Oral)   Ht 5' 2.5" (1.588 m)   Wt 119 lb (54 kg)   SpO2 96% Comment: RA  BMI 21.42  kg/m   SpO2: 96 % (RA)   amb wf nad   HEENT : pt wearing mask not removed for exam due to covid - 19 concerns.   NECK :  without JVD/Nodes/TM/ nl carotid upstrokes bilaterally   LUNGS: no acc muscle use,  Min barrel  contour chest wall with bilateral  slightly decreased bs s audible wheeze and  without cough on insp or exp maneuvers and min  Hyperresonant  to  percussion bilaterally     CV:  RRR  no s3 or murmur or increase in P2, and no edema   ABD:  soft and nontender with pos end  insp Hoover's  in the supine position. No bruits or organomegaly appreciated, bowel sounds nl  MS:   Nl gait/   ext warm without deformities, calf tenderness, cyanosis or clubbing No obvious joint restrictions   SKIN: warm and dry without lesions    NEURO:  alert, approp, nl sensorium with  no motor or cerebellar deficits apparent.         I personally reviewed images and agree with radiology impression as follows:   Chest LDS 03/10/20  1. Lung-RADS 4X, suspicious right upper lobe pulmonary lesion medial x 10.8 mm with right paratracheal lymphadenopathy.     Assessment   COPD probable gold 0  Active smoker  - PFTs per PCP > requested 03/29/2020  - as of 03/29/2020 maint on advair 250/50 one bid and prn saba  I spent extra time with pt today reviewing appropriate use of albuterol for prn use on exertion with the following points: 1) saba is for relief of sob that does not improve by walking a slower pace or resting but rather if the pt does not improve after trying this first. 2) If the pt is convinced, as many are, that saba helps recover from activity faster then it's easy to tell if this is the case by re-challenging : ie stop, take the inhaler, then p 5 minutes try the exact same activity (intensity of workload) that just caused the symptoms and see if they are substantially diminished or not after saba 3) if there is an activity that reproducibly causes the symptoms, try the saba 15 min before the activity on alternate days   If in fact the saba really does help, then fine to continue to use it prn but advised may need to look closer at the maintenance regimen being used to achieve better control of airways disease with exertion.      Solitary pulmonary nodule on lung CT Active smoker   Chest LDS 03/10/20  1. Lung-RADS 4X, suspicious right upper lobe pulmonary lesion medial x 10.8 mm with right paratracheal lymphadenopathy. - PET 03/29/2020 >>>   B[facility]ly at [facility] for PET but if pos needs consideration for excisional bx/ RULobectomy if Bronchogenic ca should be tolerated well but will  need preop pfts if recent study  not available from PCP   Discussed in detail all the  indications, usual  risks and alternatives  relative to the benefits with patient who agrees to proceed with w/u as outlined.     Medical decision making was a moderate level of complexity in this case because of  two chronic conditions /diagnoses requiring extra time for  H and P, chart review, counseling,  and generating customized AVS unique to this office visit and charting.   Each maintenance medication was reviewed in detail including emphasizing most importantly the difference between maintenance and prns and under what  circumstances the prns are to be triggered using an action plan format where appropriate. Please see avs for details which were reviewed in writing by both me and my nurse and patient given a written copy highlighted where appropriate with yellow highlighter for the patient's continued care at home along with an updated version of their medications.  Patient was asked to maintain medication reconciliation by comparing this list to the actual medications being used at home and to contact this office right away if there is a conflict or discrepancy.       Cigarette smoker >3 min discussion re active cigarette smoking in addition to office E&M  Ask about tobacco use:   ongoing Advise quitting    I reviewed the Fletcher curve with the patient that basically indicates  if you quit smoking when your best day FEV1 is still well preserved (as is clearly  the case here)  it is highly unlikely you will progress to severe disease and informed the patient there was  no medication on the market that has proven to alter the curve/ its downward trajectory  or the likelihood of progression of their disease(unlike other chronic medical conditions such as atheroclerosis where we do think we can change the natural hx with risk reducing meds)    Therefore stopping smoking and maintaining abstinence are  the most  important aspects of her care, not choice of inhalers or for that matter, doctors.   Treatment other than smoking cessation  is entirely directed by severity of symptoms and focused also on reducing exacerbations, not attempting to change the natural history of the disease.   Assess willingness:  Not committed at this point Assist in quit attempt:  Per PCP when ready Arrange follow up:   Follow up per Primary Care planned  For smoking cessation info  call [phone]       Christinia Gully, MD 03/29/2020

## 2020-03-29 NOTE — Patient Instructions (Addendum)
PFT's will be need to be obtained from [facility]   The key is to stop smoking completely before smoking completely stops you!  For smoking cessation info call [phone]      We will call to schedule PET next available  and then I will call you with the results

## 2020-03-29 NOTE — Assessment & Plan Note (Addendum)
Active smoker  - PFTs per PCP > requested 03/29/2020  - as of 03/29/2020 maint on advair 250/50 one bid and prn saba  I spent extra time with pt today reviewing appropriate use of albuterol for prn use on exertion with the following points: 1) saba is for relief of sob that does not improve by walking a slower pace or resting but rather if the pt does not improve after trying this first. 2) If the pt is convinced, as many are, that saba helps recover from activity faster then it's easy to tell if this is the case by re-challenging : ie stop, take the inhaler, then p 5 minutes try the exact same activity (intensity of workload) that just caused the symptoms and see if they are substantially diminished or not after saba 3) if there is an activity that reproducibly causes the symptoms, try the saba 15 min before the activity on alternate days   If in fact the saba really does help, then fine to continue to use it prn but advised may need to look closer at the maintenance regimen being used to achieve better control of airways disease with exertion.

## 2020-04-03 ENCOUNTER — Telehealth: Payer: Self-pay | Admitting: Internal Medicine

## 2020-04-03 NOTE — Telephone Encounter (Signed)
Advised that patient would be called the day before nothing further needed at this time.

## 2020-04-06 ENCOUNTER — Other Ambulatory Visit: Payer: Self-pay

## 2020-04-06 ENCOUNTER — Encounter (HOSPITAL_COMMUNITY)
Admission: RE | Admit: 2020-04-06 | Discharge: 2020-04-06 | Disposition: A | Discharge status: Home / Self Care | Payer: Medicare Other | Source: Ambulatory Visit | Attending: Internal Medicine | Admitting: Internal Medicine

## 2020-04-06 DIAGNOSIS — R911 Solitary pulmonary nodule: Secondary | ICD-10-CM

## 2020-04-06 LAB — GLUCOSE, CAPILLARY: Glucose-Capillary: 99 mg/dL (ref 70–99)

## 2020-04-06 IMAGING — CT NM PET TUM IMG INITIAL (PI) SKULL BASE T - THIGH
1 of 8 series · 1 of 25 positions shown · non-contrast
Comparison: Chest CT [DATE]

CLINICAL DATA: Initial treatment strategy for 5.9.

EXAM:
NUCLEAR MEDICINE PET SKULL BASE TO THIGH
TECHNIQUE: Ninety-nine mCi F-18 FDG was injected intravenously. Full-ring PET
imaging was performed from the skull base to thigh after the
radiotracer. CT data was obtained and used for attenuation
correction and anatomic localization.
Fasting blood glucose: Chest CT [DATE] mg/dl

[Series 4: ct sk_thigh 5.0 b31f · axial · 5.0mm · 0.98mm/px · 1 of 203 slices shown]
[im 203/203  brain]
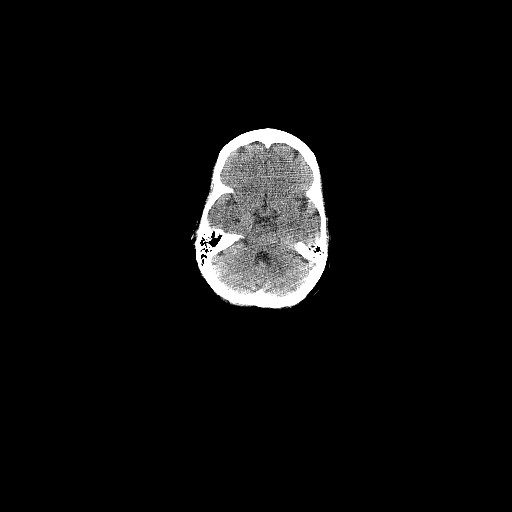

[1 of 25 positions shown; findings below may reference images not displayed]

FINDINGS: Mediastinal blood pool activity: SUV max

Liver activity: SUV max N/A

NECK: Symmetric facial palatine tonsillar activity is likely
physiologic. No abnormal hypermetabolic activity in the neck.

Incidental CT findings: Bilateral common carotid atherosclerotic
calcification. Chronic sphenoid sinusitis.

CHEST: The 1.1 cm right upper lobe nodule on image [DATE] has a
maximum SUV of 4.8, compatible with malignancy.

Several hypermetabolic right paratracheal lymph nodes present. The
largest is more caudad in position measuring 1.1 cm in short axis on
image 55/4, maximum SUV of 8.0, compatible with malignancy. The
smaller more cephalad right paratracheal nodes are likewise
hypermetabolic.

A right supraclavicular node measuring 0.4 cm in short axis on image
37/4 has maximum SUV of 2.9, mildly above blood pool.

A 6 by 5 mm right middle lobe nodule on image 48/8 is observed,
maximum SUV 0.5, but this lesion is below sensitive PET-CT size
thresholds.

Incidental CT findings: Centrilobular emphysema. Coronary, aortic
arch, and branch vessel atherosclerotic vascular disease.

ABDOMEN/PELVIS: Intraluminal density in the cecum on image 149/4
measuring about 1.6 cm in diameter, this could well simply represent
stool contents there is accentuated activity throughout the
ascending colon which is likely mostly physiologic. Cecal activity
maximum SUV 7.6. Strictly speaking, a small cecal mass or cecal
polyp cannot be excluded. No adjacent enlarged or hypermetabolic
lymph nodes.

Incidental CT findings: Aortoiliac atherosclerotic vascular disease.
Small layering gallstones. Small supraumbilical hernia contains
adipose tissue.

SKELETON: No significant abnormal hypermetabolic activity in this
region.

Incidental CT findings: Mild levoconvex lumbar rotary scoliosis.
IMPRESSION: 1. Hypermetabolic 1.1 cm right upper lobe pulmonary nodule with
hypermetabolic right upper paratracheal adenopathy. Equivocal
accentuated activity in a right supraclavicular lymph node which is
not enlarged. 6 by 5 mm right middle lobe nodule is not
hypermetabolic but is below sensitive PET-CT size thresholds.
Assuming non-small cell lung cancer and assuming that the right
middle lobe nodule and supraclavicular lymph node are benign, this
represents T1b N2 M0 disease (stage IIIA).
2. Aortic Atherosclerosis ([KR]-[KR]) and Emphysema ([KR]-[KR]).
Chronic sphenoid sinusitis. Coronary atherosclerosis.
Cholelithiasis. Small supraumbilical hernia contains adipose tissue

## 2020-04-06 MED ORDER — FLUDEOXYGLUCOSE F - 18 (FDG) INJECTION
5.8900 | Freq: Once | INTRAVENOUS | Status: AC
  Administered 2020-04-06: 5.89 via INTRAVENOUS

## 2020-04-10 ENCOUNTER — Other Ambulatory Visit: Payer: Self-pay | Admitting: Internal Medicine

## 2020-04-10 DIAGNOSIS — R911 Solitary pulmonary nodule: Secondary | ICD-10-CM

## 2020-04-10 NOTE — Progress Notes (Signed)
Referral made 

## 2020-04-16 DIAGNOSIS — C3410 Malignant neoplasm of upper lobe, unspecified bronchus or lung: Secondary | ICD-10-CM

## 2020-04-16 HISTORY — DX: Malignant neoplasm of upper lobe, unspecified bronchus or lung: C34.10

## 2020-04-25 ENCOUNTER — Other Ambulatory Visit: Payer: Self-pay

## 2020-04-25 ENCOUNTER — Institutional Professional Consult (permissible substitution): Payer: Medicare Other | Admitting: Thoracic Surgery (Cardiothoracic Vascular Surgery)

## 2020-04-25 ENCOUNTER — Encounter: Payer: Self-pay | Admitting: Thoracic Surgery (Cardiothoracic Vascular Surgery)

## 2020-04-25 VITALS — BP 120/78 | HR 71 | Temp 98.2°F | Resp 16 | Ht 62.5 in | Wt 119.0 lb

## 2020-04-25 DIAGNOSIS — D381 Neoplasm of uncertain behavior of trachea, bronchus and lung: Secondary | ICD-10-CM

## 2020-04-25 DIAGNOSIS — J449 Chronic obstructive pulmonary disease, unspecified: Secondary | ICD-10-CM

## 2020-04-25 DIAGNOSIS — R590 Localized enlarged lymph nodes: Secondary | ICD-10-CM

## 2020-04-25 DIAGNOSIS — R911 Solitary pulmonary nodule: Secondary | ICD-10-CM

## 2020-04-25 NOTE — H&P (View-Only) (Signed)
PCP is [name], PA Referring Provider is Tanda Rockers, MD  Chief Complaint  Patient presents with  . Lung Lesion    RULobe per CT CHEST 03/10/20, PET 04/06/20  . Adenopathy    HPI: Mrs. Beets is sent for consultation regarding a right upper lobe lung nodule and adenopathy.  Miguelina Fore is a 69 year old woman with a history of tobacco abuse and COPD.  She has about a 50-pack-year history of smoking (1 pack/day x 50 years) and is currently smoking about 1/2 pack of cigarettes daily.  She recently had a CT for lung cancer screening which showed a spiculated right upper lobe nodule.  She was referred to Dr. Melvyn Novas.  He did a PET/CT which showed the nodule was hypermetabolic, there also was a hypermetabolic right paratracheal node.  She has been feeling well.  She is not having any problems with chest pain, pressure, tightness, or shortness of breath.  She says she can walk up 2 flights of stairs without stopping.  She has not had any change in appetite or weight loss.  She is not having any headaches or visual changes.  Zubrod Score: At the time of surgery this patient's most appropriate activity status/level should be described as: [x]     0    Normal activity, no symptoms []     1    Restricted in physical strenuous activity but ambulatory, able to do out light work []     2    Ambulatory and capable of self care, unable to do work activities, up and about >50 % of waking hours                              []     3    Only limited self care, in bed greater than 50% of waking hours []     4    Completely disabled, no self care, confined to bed or chair []     5    Moribund  Past Medical History:  Diagnosis Date  . Cataract   . COPD (chronic obstructive pulmonary disease) ([place])     Past Surgical History:  Procedure Laterality Date  . APPENDECTOMY  2008  . COLON SURGERY  2008   colostomy-infection post op appendectomy  . COLOSTOMY REVERSAL  2009    Family History  Problem  Relation Age of Onset  . Mental illness Mother   . Colon cancer Mother 74  . Cancer Father   . Colon polyps Neg Hx   . Esophageal cancer Neg Hx   . Stomach cancer Neg Hx   . Rectal cancer Neg Hx     Social History Social History   Tobacco Use  . Smoking status: Current Every Day Smoker    Packs/day: 0.25    Types: Cigarettes  . Smokeless tobacco: Never Used  Vaping Use  . Vaping Use: Never used  Substance Use Topics  . Alcohol use: Yes    Alcohol/week: 10.0 standard drinks    Types: 10 Cans of beer per week  . Drug use: No    Current Outpatient Medications  Medication Sig Dispense Refill  . albuterol (PROVENTIL HFA;VENTOLIN HFA) 108 (90 Base) MCG/ACT inhaler Inhale 2 puffs into the lungs every 4 (four) hours as needed for wheezing or shortness of breath (cough, shortness of breath or wheezing.). 1 Inhaler 1  . Calcium Carbonate (CALCIUM 600 PO) Take 1 tablet by mouth 2 (two) times daily.    Marland Kitchen  Cholecalciferol (VITAMIN D3 PO) Take 2,000 Units by mouth daily.    . fluticasone (FLONASE) 50 MCG/ACT nasal spray Place 2 sprays into both nostrils daily. 16 g 12  . Fluticasone-Salmeterol (ADVAIR) 250-50 MCG/DOSE AEPB Inhale 1 puff into the lungs 2 (two) times daily.     . Multiple Vitamin (MULTIVITAMIN) tablet Take 1 tablet by mouth daily.     No current facility-administered medications for this visit.    No Known Allergies  Review of Systems  Constitutional: Negative for activity change, appetite change, fatigue and unexpected weight change.  HENT: Positive for dental problem. Negative for trouble swallowing and voice change.   Respiratory: Positive for cough and wheezing. Negative for shortness of breath.   Cardiovascular: Negative for chest pain, palpitations and leg swelling.  Gastrointestinal: Negative for abdominal distention and abdominal pain.  Genitourinary: Negative for difficulty urinating and dysuria.  Musculoskeletal: Negative for arthralgias and myalgias.   Neurological: Negative for syncope and weakness.  Hematological: Negative for adenopathy. Does not bruise/bleed easily.  All other systems reviewed and are negative.   BP 120/78 (BP Location: Left Arm, Patient Position: Sitting, Cuff Size: Normal)   Pulse 71   Temp 98.2 F (36.8 C)   Resp 16   Ht 5' 2.5" (1.588 m)   Wt 119 lb (54 kg)   SpO2 95% Comment: RA  BMI 21.42 kg/m  Physical Exam Constitutional:      General: She is not in acute distress.    Appearance: Normal appearance.  HENT:     Head: Normocephalic and atraumatic.  Eyes:     General: No scleral icterus.    Extraocular Movements: Extraocular movements intact.  Cardiovascular:     Rate and Rhythm: Normal rate and regular rhythm.     Pulses: Normal pulses.     Heart sounds: Normal heart sounds. No murmur heard.  No friction rub. No gallop.   Pulmonary:     Effort: Pulmonary effort is normal. No respiratory distress.     Breath sounds: Normal breath sounds. No wheezing or rales.  Abdominal:     General: There is no distension.     Palpations: Abdomen is soft.     Tenderness: There is no abdominal tenderness.  Musculoskeletal:        General: No swelling.     Cervical back: Neck supple.  Lymphadenopathy:     Cervical: No cervical adenopathy.  Skin:    General: Skin is warm and dry.  Neurological:     General: No focal deficit present.     Mental Status: She is alert and oriented to person, place, and time.     Cranial Nerves: No cranial nerve deficit.     Motor: No weakness.     Gait: Gait normal.    Diagnostic Tests: CT CHEST WITHOUT CONTRAST LOW-DOSE FOR LUNG CANCER SCREENING  TECHNIQUE: Multidetector CT imaging of the chest was performed following the standard protocol without IV contrast.  COMPARISON:  None.  FINDINGS: Cardiovascular: The heart size is normal. No substantial pericardial effusion. Coronary artery calcification is evident. Atherosclerotic calcification is noted in the wall  of the thoracic aorta.  Mediastinum/Nodes: 13 mm short axis right paratracheal node identified on 21/2. Other scattered normal sized mediastinal lymph nodes evident. No evidence for gross hilar lymphadenopathy although assessment is limited by the lack of intravenous contrast on today's study. The esophagus has normal imaging features. There is no axillary lymphadenopathy.  Lungs/Pleura: Centrilobular emphsyema noted. Biapical pleuroparenchymal scarring evident. 10.8 mm irregular pulmonary  nodule is identified in the medial right upper lobe image 80/series 9). 5 mm irregular nodule noted right middle lobe. No focal airspace consolidation. No pleural effusion.  Upper Abdomen: Unremarkable.  Musculoskeletal: No worrisome lytic or sclerotic osseous abnormality.  IMPRESSION: 1. Lung-RADS 4X, suspicious right upper lobe pulmonary lesion with right paratracheal lymphadenopathy. Additional imaging evaluation or consultation with Pulmonology or Thoracic Surgery recommended. 2. Aortic Atherosclerosis (ICD10-I70.0) and Emphysema (ICD10-J43.9).  These results will be called to the ordering clinician or representative by the Radiologist Assistant, and communication documented in the PACS or [employer].   Electronically Signed   By: Misty Stanley M.D.   On: 03/10/2020 13:39 NUCLEAR MEDICINE PET SKULL BASE TO THIGH  TECHNIQUE: Ninety-nine mCi F-18 FDG was injected intravenously. Full-ring PET imaging was performed from the skull base to thigh after the radiotracer. CT data was obtained and used for attenuation correction and anatomic localization.  Fasting blood glucose: Chest CT 03/10/2020 mg/dl  COMPARISON:  Chest CT 03/10/2020  FINDINGS: Mediastinal blood pool activity: SUV max 2.0  Liver activity: SUV max N/A  NECK: Symmetric facial palatine tonsillar activity is likely physiologic. No abnormal hypermetabolic activity in the neck.  Incidental CT  findings: Bilateral common carotid atherosclerotic calcification. Chronic sphenoid sinusitis.  CHEST: The 1.1 cm right upper lobe nodule on image 20/8 has a maximum SUV of 4.8, compatible with malignancy.  Several hypermetabolic right paratracheal lymph nodes present. The largest is more caudad in position measuring 1.1 cm in short axis on image 55/4, maximum SUV of 8.0, compatible with malignancy. The smaller more cephalad right paratracheal nodes are likewise hypermetabolic.  A right supraclavicular node measuring 0.4 cm in short axis on image 37/4 has maximum SUV of 2.9, mildly above blood pool.  A 6 by 5 mm right middle lobe nodule on image 48/8 is observed, maximum SUV 0.5, but this lesion is below sensitive PET-CT size thresholds.  Incidental CT findings: Centrilobular emphysema. Coronary, aortic arch, and branch vessel atherosclerotic vascular disease.  ABDOMEN/PELVIS: Intraluminal density in the cecum on image 149/4 measuring about 1.6 cm in diameter, this could well simply represent stool contents there is accentuated activity throughout the ascending colon which is likely mostly physiologic. Cecal activity maximum SUV 7.6. Strictly speaking, a small cecal mass or cecal polyp cannot be excluded. No adjacent enlarged or hypermetabolic lymph nodes.  Incidental CT findings: Aortoiliac atherosclerotic vascular disease. Small layering gallstones. Small supraumbilical hernia contains adipose tissue.  SKELETON: No significant abnormal hypermetabolic activity in this region.  Incidental CT findings: Mild levoconvex lumbar rotary scoliosis.  IMPRESSION: 1. Hypermetabolic 1.1 cm right upper lobe pulmonary nodule with hypermetabolic right upper paratracheal adenopathy. Equivocal accentuated activity in a right supraclavicular lymph node which is not enlarged. 6 by 5 mm right middle lobe nodule is not hypermetabolic but is below sensitive PET-CT size  thresholds. Assuming non-small cell lung cancer and assuming that the right middle lobe nodule and supraclavicular lymph node are benign, this represents T1b N2 M0 disease (stage IIIA). 2. Aortic Atherosclerosis (ICD10-I70.0) and Emphysema (ICD10-J43.9). Chronic sphenoid sinusitis. Coronary atherosclerosis. Cholelithiasis. Small supraumbilical hernia contains adipose tissue   Electronically Signed   By: Van Clines M.D.   On: 04/06/2020 13:47 I personally reviewed the CT and PET/CT images and concur with the findings noted above.  Impression: Jenavieve Freda is a 69 year old woman with a history of tobacco abuse who was found to have a right upper lobe lung nodule on a low-dose screening CT.  She then had a PET/CT  which showed the nodule was hypermetabolic.  Unfortunately she also has a hypermetabolic right paratracheal node.  There is a second nodule in the middle lobe that is too small for PET resolution. There is a small mildly hypermetabolic supraclavicular node that is likely benign. 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    I reviewed the images with Mr. and Mrs. Woodberry and we discussed options for treatment.  They understand that surgery alone would not be considered curative.  I think her best option is to treat with neoadjuvant chemotherapy with or without radiation and then proceed with surgical resection if there is a response.  First we need to establish a diagnosis.  I recommended we do a navigational bronchoscopy and endobronchial ultrasound for diagnosis and staging.  I informed her of the general nature of the procedure.  I informed her the indications, risks, benefits, and alternatives.  She understands the risks include, but are not limited to death, MI, stroke, blood clot, bleeding, pneumothorax, and failure to make a diagnosis.  She accepts the risk and agrees to proceed.  I offered to do later this week but she wants to wait until  the week of August 23.  I emphasized the importance of complete tobacco cessation.  Plan: Electromagnetic navigational bronchoscopy and endobronchial ultrasound on Monday, 05/08/2020  [name], MD [facility] [phone]

## 2020-04-25 NOTE — Progress Notes (Signed)
PCP is [name], PA Referring Provider is Tanda Rockers, MD  Chief Complaint  Patient presents with   Lung Lesion    RULobe per CT CHEST 03/10/20, PET 04/06/20   Adenopathy    HPI: Mrs. Barbato is sent for consultation regarding a right upper lobe lung nodule and adenopathy.  Marzelle Rutten is a 69 year old woman with a history of tobacco abuse and COPD.  She has about a 50-pack-year history of smoking (1 pack/day x 50 years) and is currently smoking about 1/2 pack of cigarettes daily.  She recently had a CT for lung cancer screening which showed a spiculated right upper lobe nodule.  She was referred to Dr. Melvyn Novas.  He did a PET/CT which showed the nodule was hypermetabolic, there also was a hypermetabolic right paratracheal node.  She has been feeling well.  She is not having any problems with chest pain, pressure, tightness, or shortness of breath.  She says she can walk up 2 flights of stairs without stopping.  She has not had any change in appetite or weight loss.  She is not having any headaches or visual changes.  Zubrod Score: At the time of surgery this patients most appropriate activity status/level should be described as: [x]     0    Normal activity, no symptoms []     1    Restricted in physical strenuous activity but ambulatory, able to do out light work []     2    Ambulatory and capable of self care, unable to do work activities, up and about >50 % of waking hours                              []     3    Only limited self care, in bed greater than 50% of waking hours []     4    Completely disabled, no self care, confined to bed or chair []     5    Moribund  Past Medical History:  Diagnosis Date   Cataract    COPD (chronic obstructive pulmonary disease) ([place])     Past Surgical History:  Procedure Laterality Date   APPENDECTOMY  2008   COLON SURGERY  2008   colostomy-infection post op appendectomy   COLOSTOMY REVERSAL  2009    Family History  Problem  Relation Age of Onset   Mental illness Mother    Colon cancer Mother 62   Cancer Father    Colon polyps Neg Hx    Esophageal cancer Neg Hx    Stomach cancer Neg Hx    Rectal cancer Neg Hx     Social History Social History   Tobacco Use   Smoking status: Current Every Day Smoker    Packs/day: 0.25    Types: Cigarettes   Smokeless tobacco: Never Used  Vaping Use   Vaping Use: Never used  Substance Use Topics   Alcohol use: Yes    Alcohol/week: 10.0 standard drinks    Types: 10 Cans of beer per week   Drug use: No    Current Outpatient Medications  Medication Sig Dispense Refill   albuterol (PROVENTIL HFA;VENTOLIN HFA) 108 (90 Base) MCG/ACT inhaler Inhale 2 puffs into the lungs every 4 (four) hours as needed for wheezing or shortness of breath (cough, shortness of breath or wheezing.). 1 Inhaler 1   Calcium Carbonate (CALCIUM 600 PO) Take 1 tablet by mouth 2 (two) times daily.  Cholecalciferol (VITAMIN D3 PO) Take 2,000 Units by mouth daily.     fluticasone (FLONASE) 50 MCG/ACT nasal spray Place 2 sprays into both nostrils daily. 16 g 12   Fluticasone-Salmeterol (ADVAIR) 250-50 MCG/DOSE AEPB Inhale 1 puff into the lungs 2 (two) times daily.      Multiple Vitamin (MULTIVITAMIN) tablet Take 1 tablet by mouth daily.     No current facility-administered medications for this visit.    No Known Allergies  Review of Systems  Constitutional: Negative for activity change, appetite change, fatigue and unexpected weight change.  HENT: Positive for dental problem. Negative for trouble swallowing and voice change.   Respiratory: Positive for cough and wheezing. Negative for shortness of breath.   Cardiovascular: Negative for chest pain, palpitations and leg swelling.  Gastrointestinal: Negative for abdominal distention and abdominal pain.  Genitourinary: Negative for difficulty urinating and dysuria.  Musculoskeletal: Negative for arthralgias and myalgias.   Neurological: Negative for syncope and weakness.  Hematological: Negative for adenopathy. Does not bruise/bleed easily.  All other systems reviewed and are negative.   BP 120/78 (BP Location: Left Arm, Patient Position: Sitting, Cuff Size: Normal)    Pulse 71    Temp 98.2 F (36.8 C)    Resp 16    Ht 5' 2.5" (1.588 m)    Wt 119 lb (54 kg)    SpO2 95% Comment: RA   BMI 21.42 kg/m  Physical Exam Constitutional:      General: She is not in acute distress.    Appearance: Normal appearance.  HENT:     Head: Normocephalic and atraumatic.  Eyes:     General: No scleral icterus.    Extraocular Movements: Extraocular movements intact.  Cardiovascular:     Rate and Rhythm: Normal rate and regular rhythm.     Pulses: Normal pulses.     Heart sounds: Normal heart sounds. No murmur heard.  No friction rub. No gallop.   Pulmonary:     Effort: Pulmonary effort is normal. No respiratory distress.     Breath sounds: Normal breath sounds. No wheezing or rales.  Abdominal:     General: There is no distension.     Palpations: Abdomen is soft.     Tenderness: There is no abdominal tenderness.  Musculoskeletal:        General: No swelling.     Cervical back: Neck supple.  Lymphadenopathy:     Cervical: No cervical adenopathy.  Skin:    General: Skin is warm and dry.  Neurological:     General: No focal deficit present.     Mental Status: She is alert and oriented to person, place, and time.     Cranial Nerves: No cranial nerve deficit.     Motor: No weakness.     Gait: Gait normal.    Diagnostic Tests: CT CHEST WITHOUT CONTRAST LOW-DOSE FOR LUNG CANCER SCREENING  TECHNIQUE: Multidetector CT imaging of the chest was performed following the standard protocol without IV contrast.  COMPARISON:  None.  FINDINGS: Cardiovascular: The heart size is normal. No substantial pericardial effusion. Coronary artery calcification is evident. Atherosclerotic calcification is noted in the wall  of the thoracic aorta.  Mediastinum/Nodes: 13 mm short axis right paratracheal node identified on 21/2. Other scattered normal sized mediastinal lymph nodes evident. No evidence for gross hilar lymphadenopathy although assessment is limited by the lack of intravenous contrast on today's study. The esophagus has normal imaging features. There is no axillary lymphadenopathy.  Lungs/Pleura: Centrilobular emphsyema noted. Biapical  pleuroparenchymal scarring evident. 10.8 mm irregular pulmonary nodule is identified in the medial right upper lobe image 80/series 9). 5 mm irregular nodule noted right middle lobe. No focal airspace consolidation. No pleural effusion.  Upper Abdomen: Unremarkable.  Musculoskeletal: No worrisome lytic or sclerotic osseous abnormality.  IMPRESSION: 1. Lung-RADS 4X, suspicious right upper lobe pulmonary lesion with right paratracheal lymphadenopathy. Additional imaging evaluation or consultation with Pulmonology or Thoracic Surgery recommended. 2. Aortic Atherosclerosis (ICD10-I70.0) and Emphysema (ICD10-J43.9).  These results will be called to the ordering clinician or representative by the Radiologist Assistant, and communication documented in the PACS or [employer].   Electronically Signed   By: Misty Stanley M.D.   On: 03/10/2020 13:39 NUCLEAR MEDICINE PET SKULL BASE TO THIGH  TECHNIQUE: Ninety-nine mCi F-18 FDG was injected intravenously. Full-ring PET imaging was performed from the skull base to thigh after the radiotracer. CT data was obtained and used for attenuation correction and anatomic localization.  Fasting blood glucose: Chest CT 03/10/2020 mg/dl  COMPARISON:  Chest CT 03/10/2020  FINDINGS: Mediastinal blood pool activity: SUV max 2.0  Liver activity: SUV max N/A  NECK: Symmetric facial palatine tonsillar activity is likely physiologic. No abnormal hypermetabolic activity in the neck.  Incidental CT  findings: Bilateral common carotid atherosclerotic calcification. Chronic sphenoid sinusitis.  CHEST: The 1.1 cm right upper lobe nodule on image 20/8 has a maximum SUV of 4.8, compatible with malignancy.  Several hypermetabolic right paratracheal lymph nodes present. The largest is more caudad in position measuring 1.1 cm in short axis on image 55/4, maximum SUV of 8.0, compatible with malignancy. The smaller more cephalad right paratracheal nodes are likewise hypermetabolic.  A right supraclavicular node measuring 0.4 cm in short axis on image 37/4 has maximum SUV of 2.9, mildly above blood pool.  A 6 by 5 mm right middle lobe nodule on image 48/8 is observed, maximum SUV 0.5, but this lesion is below sensitive PET-CT size thresholds.  Incidental CT findings: Centrilobular emphysema. Coronary, aortic arch, and branch vessel atherosclerotic vascular disease.  ABDOMEN/PELVIS: Intraluminal density in the cecum on image 149/4 measuring about 1.6 cm in diameter, this could well simply represent stool contents there is accentuated activity throughout the ascending colon which is likely mostly physiologic. Cecal activity maximum SUV 7.6. Strictly speaking, a small cecal mass or cecal polyp cannot be excluded. No adjacent enlarged or hypermetabolic lymph nodes.  Incidental CT findings: Aortoiliac atherosclerotic vascular disease. Small layering gallstones. Small supraumbilical hernia contains adipose tissue.  SKELETON: No significant abnormal hypermetabolic activity in this region.  Incidental CT findings: Mild levoconvex lumbar rotary scoliosis.  IMPRESSION: 1. Hypermetabolic 1.1 cm right upper lobe pulmonary nodule with hypermetabolic right upper paratracheal adenopathy. Equivocal accentuated activity in a right supraclavicular lymph node which is not enlarged. 6 by 5 mm right middle lobe nodule is not hypermetabolic but is below sensitive PET-CT size  thresholds. Assuming non-small cell lung cancer and assuming that the right middle lobe nodule and supraclavicular lymph node are benign, this represents T1b N2 M0 disease (stage IIIA). 2. Aortic Atherosclerosis (ICD10-I70.0) and Emphysema (ICD10-J43.9). Chronic sphenoid sinusitis. Coronary atherosclerosis. Cholelithiasis. Small supraumbilical hernia contains adipose tissue   Electronically Signed   By: Van Clines M.D.   On: 04/06/2020 13:47 I personally reviewed the CT and PET/CT images and concur with the findings noted above.  Impression: Freddi Forster is a 69 year old woman with a history of tobacco abuse who was found to have a right upper lobe lung nodule on a low-dose screening  CT.  She then had a PET/CT which showed the nodule was hypermetabolic.  Unfortunately she also has a hypermetabolic right paratracheal node.  There is a second nodule in the middle lobe that is too small for PET resolution. There is a small mildly hypermetabolic supraclavicular node that is likely benign. 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    I reviewed the images with Mr. and Mrs. Lucus and we discussed options for treatment.  They understand that surgery alone would not be considered curative.  I think her best option is to treat with neoadjuvant chemotherapy with or without radiation and then proceed with surgical resection if there is a response.  First we need to establish a diagnosis.  I recommended we do a navigational bronchoscopy and endobronchial ultrasound for diagnosis and staging.  I informed her of the general nature of the procedure.  I informed her the indications, risks, benefits, and alternatives.  She understands the risks include, but are not limited to death, MI, stroke, blood clot, bleeding, pneumothorax, and failure to make a diagnosis.  She accepts the risk and agrees to proceed.  I offered to do later this week but she wants to wait until  the week of August 23.  I emphasized the importance of complete tobacco cessation.  Plan: Electromagnetic navigational bronchoscopy and endobronchial ultrasound on Monday, 05/08/2020  [name], MD [facility] [phone]

## 2020-05-04 NOTE — Progress Notes (Signed)
[facility] [place], [address] [place] [address] Phone: [phone] Fax: [phone]      Your procedure is scheduled on Monday, August 23rd.  Report to [facility] Main Entrance "A" at 10:25 A.M., and check in at the Admitting office.  Call this number if you have problems the morning of surgery:  [phone]  Call [phone] if you have any questions prior to your surgery date Monday-Friday 8am-4pm    Remember:  Do not eat after midnight the night before your surgery  You may drink clear liquids until 9:25 AM the morning of your surgery.   Clear liquids allowed are: Water, Non-Citrus Juices (without pulp), Carbonated Beverages, Clear Tea, Black Coffee Only, and Gatorade    Take these medicines the morning of surgery  Albuterol inhaler - if needed  Flonase nasal spray - if needed  Advair inhaler - if needed  Eye drops - if needed  As of today, STOP taking any Aspirin (unless otherwise instructed by your surgeon) Aleve, Naproxen, Ibuprofen, Motrin, Advil, Goody's, BC's, all herbal medications, fish oil, and all vitamins.                      Do not wear jewelry, make up, or nail polish            Do not wear lotions, powders, perfumes, or deodorant.            Do not shave 48 hours prior to surgery.             Do not bring valuables to the hospital.            [facility] is not responsible for any belongings or valuables.  Do NOT Smoke (Tobacco/Vaping) or drink Alcohol 24 hours prior to your procedure If you use a CPAP at night, you may bring all equipment for your overnight stay.   Contacts, glasses, dentures or bridgework may not be worn into surgery.      For patients admitted to the hospital, discharge time will be determined by your treatment team.   Patients discharged the day of surgery will not be allowed to drive home, and someone needs to stay with them for 24  hours.    Special instructions:   [place]- Preparing For Surgery  Before surgery, you can play an important role. Because skin is not sterile, your skin needs to be as free of germs as possible. You can reduce the number of germs on your skin by washing with CHG (chlorahexidine gluconate) Soap before surgery.  CHG is an antiseptic cleaner which kills germs and bonds with the skin to continue killing germs even after washing.    Oral Hygiene is also important to reduce your risk of infection.  Remember - BRUSH YOUR TEETH THE MORNING OF SURGERY WITH YOUR REGULAR TOOTHPASTE  Please do not use if you have an allergy to CHG or antibacterial soaps. If your skin becomes reddened/irritated stop using the CHG.  Do not shave (including legs and underarms) for at least 48 hours prior to first CHG shower. It is OK to shave your face.  Please follow these instructions carefully.   1. Shower the NIGHT BEFORE SURGERY and the MORNING OF SURGERY with CHG Soap.   2. If you chose to wash your hair, wash your hair first as usual with your normal shampoo.  3. After you  shampoo, rinse your hair and body thoroughly to remove the shampoo.  4. Use CHG as you would any other liquid soap. You can apply CHG directly to the skin and wash gently with a scrungie or a clean washcloth.   5. Apply the CHG Soap to your body ONLY FROM THE NECK DOWN.  Do not use on open wounds or open sores. Avoid contact with your eyes, ears, mouth and genitals (private parts). Wash Face and genitals (private parts)  with your normal soap.   6. Wash thoroughly, paying special attention to the area where your surgery will be performed.  7. Thoroughly rinse your body with warm water from the neck down.  8. DO NOT shower/wash with your normal soap after using and rinsing off the CHG Soap.  9. Pat yourself dry with a CLEAN TOWEL.  10. Wear CLEAN PAJAMAS to bed the night before surgery  11. Place CLEAN SHEETS on your bed the night of  your first shower and DO NOT SLEEP WITH PETS.   Day of Surgery: Wear Clean/Comfortable clothing the morning of surgery Do not apply any deodorants/lotions.   Remember to brush your teeth WITH YOUR REGULAR TOOTHPASTE.   Please read over the following fact sheets that you were given.

## 2020-05-05 ENCOUNTER — Encounter (HOSPITAL_COMMUNITY)
Admission: RE | Admit: 2020-05-05 | Discharge: 2020-05-05 | Disposition: A | Discharge status: Home / Self Care | Payer: Medicare Other | Source: Ambulatory Visit | Attending: Thoracic Surgery (Cardiothoracic Vascular Surgery) | Admitting: Thoracic Surgery (Cardiothoracic Vascular Surgery)

## 2020-05-05 ENCOUNTER — Other Ambulatory Visit: Payer: Self-pay

## 2020-05-05 ENCOUNTER — Ambulatory Visit (HOSPITAL_COMMUNITY)
Admission: RE | Admit: 2020-05-05 | Discharge: 2020-05-05 | Disposition: A | Discharge status: Home / Self Care | Payer: Medicare Other | Source: Ambulatory Visit | Attending: Thoracic Surgery (Cardiothoracic Vascular Surgery) | Admitting: Thoracic Surgery (Cardiothoracic Vascular Surgery)

## 2020-05-05 ENCOUNTER — Encounter (HOSPITAL_COMMUNITY): Payer: Self-pay

## 2020-05-05 DIAGNOSIS — Z01818 Encounter for other preprocedural examination: Secondary | ICD-10-CM | POA: Insufficient documentation

## 2020-05-05 DIAGNOSIS — R911 Solitary pulmonary nodule: Secondary | ICD-10-CM | POA: Insufficient documentation

## 2020-05-05 DIAGNOSIS — I700 Atherosclerosis of aorta: Secondary | ICD-10-CM | POA: Insufficient documentation

## 2020-05-05 DIAGNOSIS — R590 Localized enlarged lymph nodes: Secondary | ICD-10-CM | POA: Diagnosis not present

## 2020-05-05 LAB — COMPREHENSIVE METABOLIC PANEL
ALT: 16 U/L (ref 0–44)
AST: 21 U/L (ref 15–41)
Albumin: 4.1 g/dL (ref 3.5–5.0)
Alkaline Phosphatase: 68 U/L (ref 38–126)
Anion gap: 12 (ref 5–15)
BUN: 15 mg/dL (ref 8–23)
CO2: 25 mmol/L (ref 22–32)
Calcium: 9.1 mg/dL (ref 8.9–10.3)
Chloride: 103 mmol/L (ref 98–111)
Creatinine, Ser: 0.66 mg/dL (ref 0.44–1.00)
GFR calc Af Amer: >60 mL/min (ref >60)
GFR calc non Af Amer: >60 mL/min (ref >60)
Glucose, Bld: 92 mg/dL (ref 70–99)
Potassium: 4 mmol/L (ref 3.5–5.1)
Sodium: 140 mmol/L (ref 135–145)
Total Bilirubin: 0.8 mg/dL (ref 0.3–1.2)
Total Protein: 7 g/dL (ref 6.5–8.1)

## 2020-05-05 LAB — PROTIME-INR
INR: 1 (ref 0.8–1.2)
Prothrombin Time: 12.4 seconds (ref 11.4–15.2)

## 2020-05-05 LAB — CBC
HCT: 46.4 % — ABNORMAL HIGH (ref 36.0–46.0)
Hemoglobin: 14.9 g/dL (ref 12.0–15.0)
MCH: 32.2 pg (ref 26.0–34.0)
MCHC: 32.1 g/dL (ref 30.0–36.0)
MCV: 100.2 fL — ABNORMAL HIGH (ref 80.0–100.0)
Platelets: 211 10*3/uL (ref 150–400)
RBC: 4.63 MIL/uL (ref 3.87–5.11)
RDW: 12.7 % (ref 11.5–15.5)
WBC: 8 10*3/uL (ref 4.0–10.5)
nRBC: 0 % (ref 0.0–0.2)

## 2020-05-05 LAB — APTT: aPTT: 30 seconds (ref 24–36)

## 2020-05-05 IMAGING — CR DG CHEST 2V
2 series · 2 of 2 positions shown · non-contrast
Comparison: Chest x-ray [DATE].

CLINICAL DATA: 69-year-old female with history of pulmonary nodule.
Preprocedural study prior to endobronchial navigation.

EXAM:
CHEST - 2 VIEW

[w chest pa]
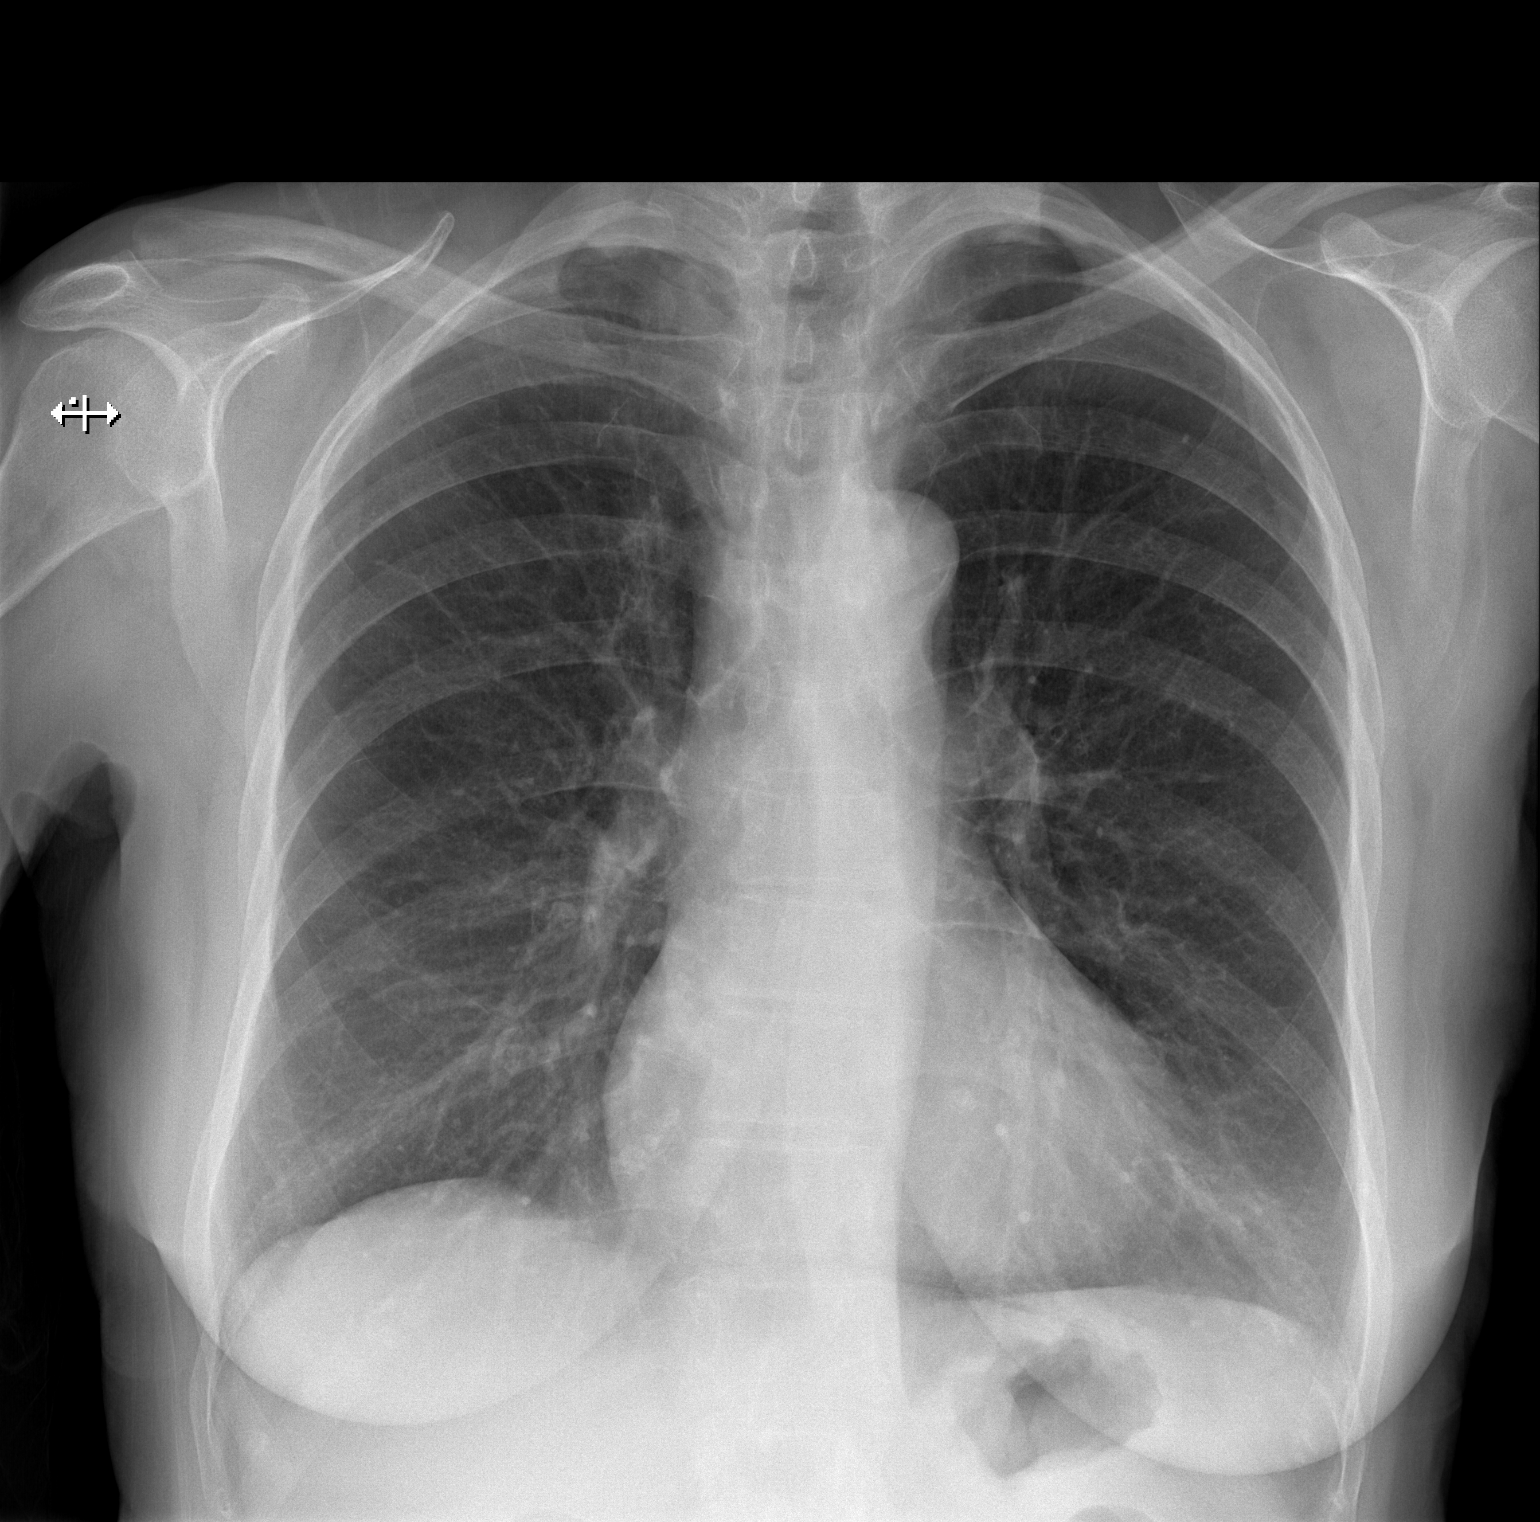

[w chest lat]
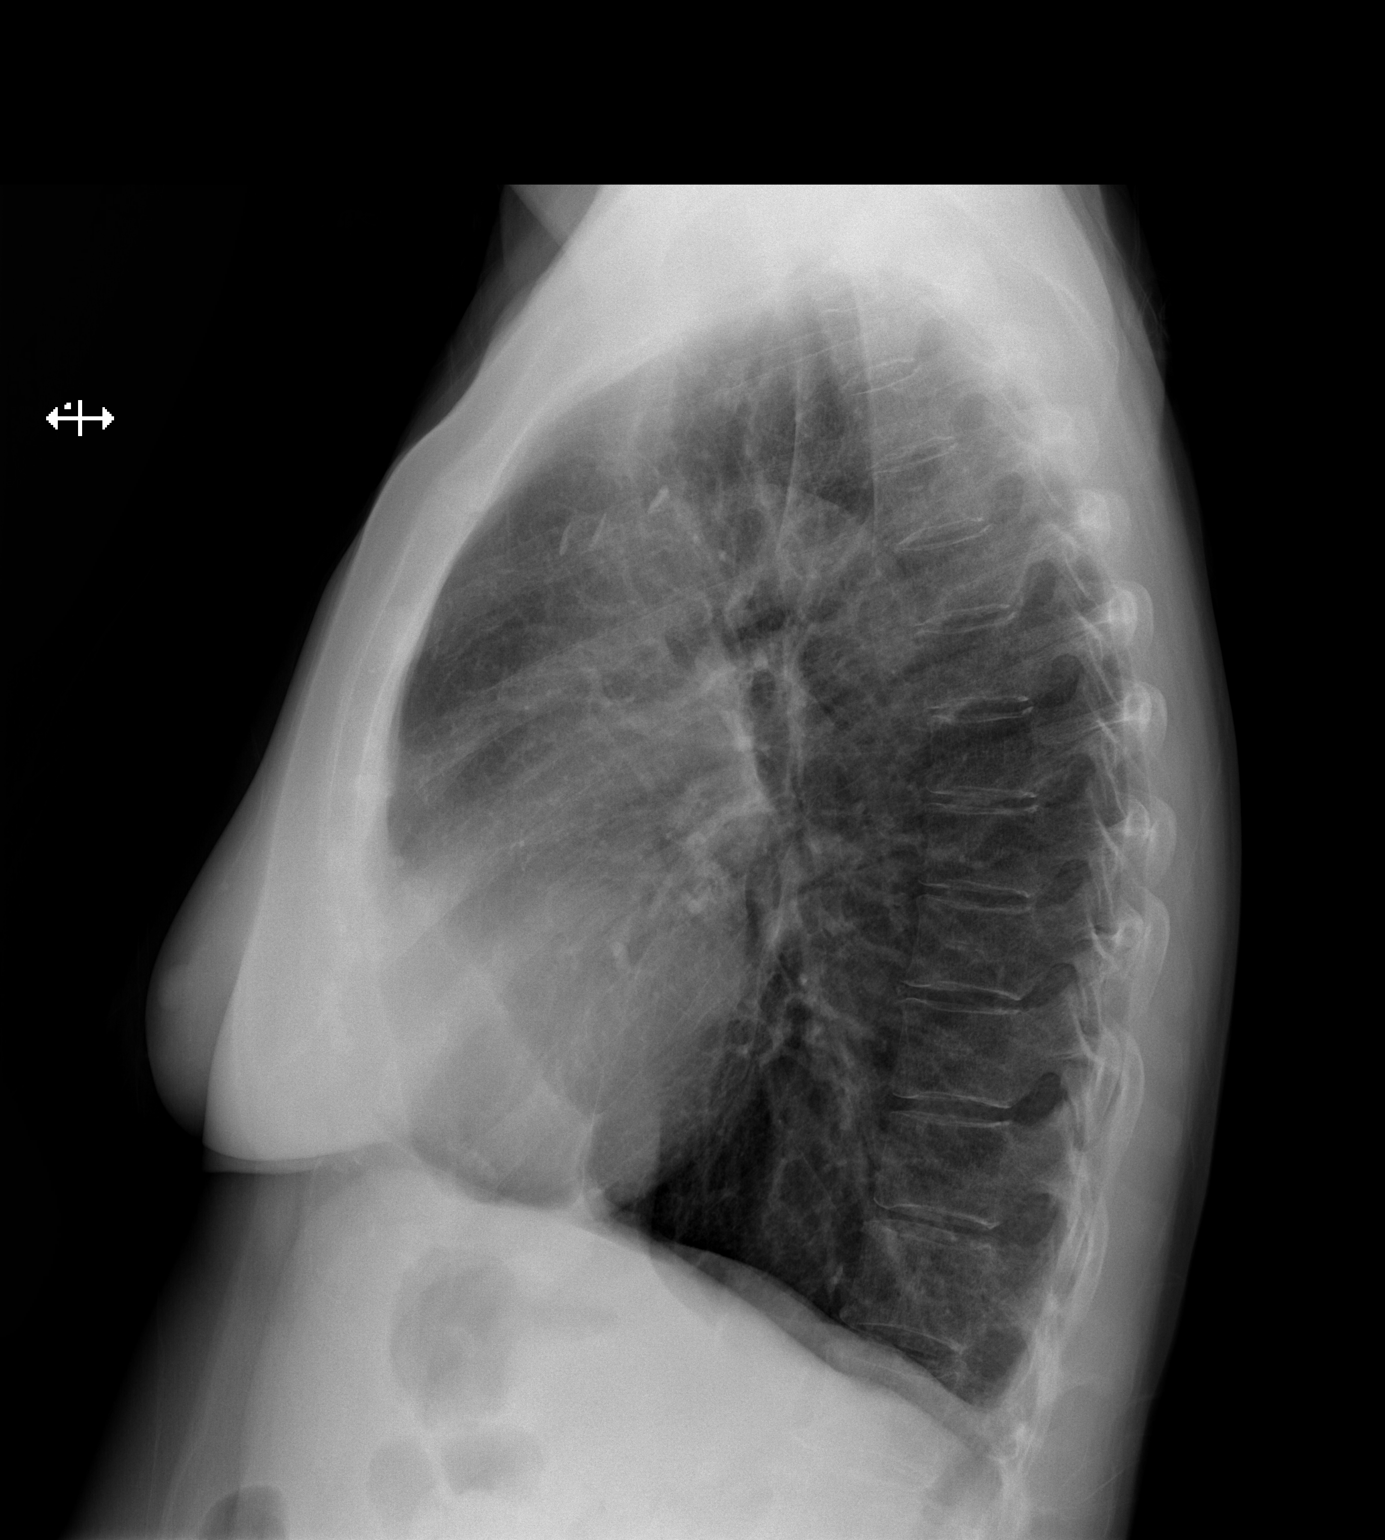

[2 of 2 positions shown; findings below may reference images not displayed]

FINDINGS: Lung volumes are normal. No consolidative airspace disease. No
pleural effusions. No pneumothorax. No pulmonary nodule or mass
noted. Pulmonary vasculature and the cardiomediastinal silhouette
are within normal limits. Atherosclerosis in the thoracic aorta.
IMPRESSION: 1.  No radiographic evidence of acute cardiopulmonary disease.
2. Aortic atherosclerosis.

## 2020-05-05 NOTE — Progress Notes (Signed)
PCP - [name]  Chest x-ray - 05-05-20 EKG - n/a   COVID TEST- 05-06-20   Anesthesia review: n/a  Patient denies shortness of breath, fever, cough and chest pain at PAT appointment   All instructions explained to the patient, with a verbal understanding of the material. Patient agrees to go over the instructions while at home for a better understanding. Patient also instructed to self quarantine after being tested for COVID-19. The opportunity to ask questions was provided.

## 2020-05-06 ENCOUNTER — Other Ambulatory Visit (HOSPITAL_COMMUNITY)
Admission: RE | Admit: 2020-05-06 | Discharge: 2020-05-06 | Disposition: A | Discharge status: Home / Self Care | Payer: Medicare Other | Source: Ambulatory Visit | Attending: Thoracic Surgery (Cardiothoracic Vascular Surgery) | Admitting: Thoracic Surgery (Cardiothoracic Vascular Surgery)

## 2020-05-06 DIAGNOSIS — Z20822 Contact with and (suspected) exposure to covid-19: Secondary | ICD-10-CM | POA: Diagnosis not present

## 2020-05-06 DIAGNOSIS — Z01812 Encounter for preprocedural laboratory examination: Secondary | ICD-10-CM | POA: Insufficient documentation

## 2020-05-06 DIAGNOSIS — R911 Solitary pulmonary nodule: Secondary | ICD-10-CM

## 2020-05-06 DIAGNOSIS — R590 Localized enlarged lymph nodes: Secondary | ICD-10-CM

## 2020-05-06 LAB — SARS CORONAVIRUS 2 (TAT 6-24 HRS): SARS Coronavirus 2: NEGATIVE

## 2020-05-08 ENCOUNTER — Other Ambulatory Visit: Payer: Self-pay

## 2020-05-08 ENCOUNTER — Ambulatory Visit (HOSPITAL_COMMUNITY): Payer: Medicare Other | Admitting: Certified Registered Nurse Anesthetist

## 2020-05-08 ENCOUNTER — Encounter (HOSPITAL_COMMUNITY): Payer: Self-pay | Admitting: Thoracic Surgery (Cardiothoracic Vascular Surgery)

## 2020-05-08 ENCOUNTER — Ambulatory Visit (HOSPITAL_COMMUNITY)
Admission: RE | Admit: 2020-05-08 | Discharge: 2020-05-08 | Disposition: A | Discharge status: Home / Self Care | Payer: Medicare Other | Attending: Thoracic Surgery (Cardiothoracic Vascular Surgery) | Admitting: Thoracic Surgery (Cardiothoracic Vascular Surgery)

## 2020-05-08 ENCOUNTER — Ambulatory Visit (HOSPITAL_COMMUNITY): Payer: Medicare Other

## 2020-05-08 ENCOUNTER — Encounter (HOSPITAL_COMMUNITY)
Admission: RE | Disposition: A | Discharge status: Home / Self Care | Payer: Self-pay | Source: Home / Self Care | Attending: Thoracic Surgery (Cardiothoracic Vascular Surgery)

## 2020-05-08 DIAGNOSIS — Z419 Encounter for procedure for purposes other than remedying health state, unspecified: Secondary | ICD-10-CM

## 2020-05-08 DIAGNOSIS — J449 Chronic obstructive pulmonary disease, unspecified: Secondary | ICD-10-CM | POA: Diagnosis not present

## 2020-05-08 DIAGNOSIS — C771 Secondary and unspecified malignant neoplasm of intrathoracic lymph nodes: Secondary | ICD-10-CM | POA: Insufficient documentation

## 2020-05-08 DIAGNOSIS — R590 Localized enlarged lymph nodes: Secondary | ICD-10-CM

## 2020-05-08 DIAGNOSIS — Z79899 Other long term (current) drug therapy: Secondary | ICD-10-CM | POA: Insufficient documentation

## 2020-05-08 DIAGNOSIS — R911 Solitary pulmonary nodule: Secondary | ICD-10-CM | POA: Diagnosis present

## 2020-05-08 DIAGNOSIS — F17210 Nicotine dependence, cigarettes, uncomplicated: Secondary | ICD-10-CM | POA: Insufficient documentation

## 2020-05-08 DIAGNOSIS — C3411 Malignant neoplasm of upper lobe, right bronchus or lung: Secondary | ICD-10-CM | POA: Insufficient documentation

## 2020-05-08 DIAGNOSIS — Z9049 Acquired absence of other specified parts of digestive tract: Secondary | ICD-10-CM | POA: Insufficient documentation

## 2020-05-08 DIAGNOSIS — Z7951 Long term (current) use of inhaled steroids: Secondary | ICD-10-CM | POA: Insufficient documentation

## 2020-05-08 HISTORY — PX: VIDEO BRONCHOSCOPY WITH ENDOBRONCHIAL NAVIGATION: SHX6175

## 2020-05-08 HISTORY — PX: VIDEO BRONCHOSCOPY WITH ENDOBRONCHIAL ULTRASOUND: SHX6177

## 2020-05-08 SURGERY — VIDEO BRONCHOSCOPY WITH ENDOBRONCHIAL NAVIGATION
Anesthesia: General

## 2020-05-08 MED ORDER — ONDANSETRON HCL 4 MG/2ML IJ SOLN
INTRAMUSCULAR | Status: AC
  Filled 2020-05-08: qty 2

## 2020-05-08 MED ORDER — 0.9 % SODIUM CHLORIDE (POUR BTL) OPTIME
TOPICAL | Status: DC | PRN
  Administered 2020-05-08: 1000 mL

## 2020-05-08 MED ORDER — PROPOFOL 10 MG/ML IV BOLUS
INTRAVENOUS | Status: DC | PRN
  Administered 2020-05-08: 110 mg via INTRAVENOUS

## 2020-05-08 MED ORDER — ROCURONIUM BROMIDE 10 MG/ML (PF) SYRINGE
PREFILLED_SYRINGE | INTRAVENOUS | Status: DC | PRN
  Administered 2020-05-08: 60 mg via INTRAVENOUS
  Administered 2020-05-08: 20 mg via INTRAVENOUS

## 2020-05-08 MED ORDER — FENTANYL CITRATE (PF) 250 MCG/5ML IJ SOLN
INTRAMUSCULAR | Status: AC
  Filled 2020-05-08: qty 5

## 2020-05-08 MED ORDER — PROPOFOL 1000 MG/100ML IV EMUL
INTRAVENOUS | Status: AC
  Filled 2020-05-08: qty 100

## 2020-05-08 MED ORDER — MIDAZOLAM HCL 2 MG/2ML IJ SOLN
INTRAMUSCULAR | Status: AC
  Filled 2020-05-08: qty 2

## 2020-05-08 MED ORDER — CEFAZOLIN SODIUM 1 G IJ SOLR
INTRAMUSCULAR | Status: AC
  Filled 2020-05-08: qty 20

## 2020-05-08 MED ORDER — SUGAMMADEX SODIUM 200 MG/2ML IV SOLN
INTRAVENOUS | Status: DC | PRN
  Administered 2020-05-08: 100 mg via INTRAVENOUS

## 2020-05-08 MED ORDER — ACETAMINOPHEN 500 MG PO TABS: 1000.0000 mg | ORAL_TABLET | Freq: Once | ORAL | Status: DC | PRN

## 2020-05-08 MED ORDER — EPINEPHRINE PF 1 MG/ML IJ SOLN
INTRAMUSCULAR | Status: DC | PRN
  Administered 2020-05-08: 1 mg

## 2020-05-08 MED ORDER — ACETAMINOPHEN 160 MG/5ML PO SOLN: 1000.0000 mg | Freq: Once | ORAL | Status: DC | PRN

## 2020-05-08 MED ORDER — EPINEPHRINE PF 1 MG/ML IJ SOLN
INTRAMUSCULAR | Status: AC
  Filled 2020-05-08: qty 1

## 2020-05-08 MED ORDER — MIDAZOLAM HCL 2 MG/2ML IJ SOLN
INTRAMUSCULAR | Status: DC | PRN
  Administered 2020-05-08: 1 mg via INTRAVENOUS

## 2020-05-08 MED ORDER — PROPOFOL 10 MG/ML IV BOLUS
INTRAVENOUS | Status: AC
  Filled 2020-05-08: qty 20

## 2020-05-08 MED ORDER — ACETAMINOPHEN 10 MG/ML IV SOLN: 1000.0000 mg | Freq: Once | INTRAVENOUS | Status: DC | PRN

## 2020-05-08 MED ORDER — DEXAMETHASONE SODIUM PHOSPHATE 10 MG/ML IJ SOLN
INTRAMUSCULAR | Status: AC
  Filled 2020-05-08: qty 1

## 2020-05-08 MED ORDER — ORAL CARE MOUTH RINSE: 15.0000 mL | Freq: Once | OROMUCOSAL | Status: AC

## 2020-05-08 MED ORDER — FENTANYL CITRATE (PF) 100 MCG/2ML IJ SOLN: 25.0000 ug | INTRAMUSCULAR | Status: DC | PRN

## 2020-05-08 MED ORDER — ONDANSETRON HCL 4 MG/2ML IJ SOLN
INTRAMUSCULAR | Status: DC | PRN
  Administered 2020-05-08: 4 mg via INTRAVENOUS

## 2020-05-08 MED ORDER — FENTANYL CITRATE (PF) 100 MCG/2ML IJ SOLN
INTRAMUSCULAR | Status: DC | PRN
  Administered 2020-05-08: 100 ug via INTRAVENOUS
  Administered 2020-05-08: 50 ug via INTRAVENOUS

## 2020-05-08 MED ORDER — DEXAMETHASONE SODIUM PHOSPHATE 10 MG/ML IJ SOLN
INTRAMUSCULAR | Status: DC | PRN
  Administered 2020-05-08: 10 mg via INTRAVENOUS

## 2020-05-08 MED ORDER — CHLORHEXIDINE GLUCONATE 0.12 % MT SOLN
15.0000 mL | Freq: Once | OROMUCOSAL | Status: AC
  Administered 2020-05-08: 15 mL via OROMUCOSAL
  Filled 2020-05-08: qty 15

## 2020-05-08 MED ORDER — PHENYLEPHRINE HCL-NACL 10-0.9 MG/250ML-% IV SOLN
INTRAVENOUS | Status: DC | PRN
  Administered 2020-05-08: 40 ug/min via INTRAVENOUS

## 2020-05-08 MED ORDER — OXYCODONE HCL 5 MG PO TABS: 5.0000 mg | ORAL_TABLET | Freq: Once | ORAL | Status: DC | PRN

## 2020-05-08 MED ORDER — LIDOCAINE 2% (20 MG/ML) 5 ML SYRINGE
INTRAMUSCULAR | Status: AC
  Filled 2020-05-08: qty 5

## 2020-05-08 MED ORDER — LIDOCAINE 2% (20 MG/ML) 5 ML SYRINGE
INTRAMUSCULAR | Status: DC | PRN
  Administered 2020-05-08: 40 mg via INTRAVENOUS

## 2020-05-08 MED ORDER — OXYCODONE HCL 5 MG/5ML PO SOLN: 5.0000 mg | Freq: Once | ORAL | Status: DC | PRN

## 2020-05-08 MED ORDER — LACTATED RINGERS IV SOLN: INTRAVENOUS | Status: DC

## 2020-05-08 MED ORDER — PROPOFOL 500 MG/50ML IV EMUL
INTRAVENOUS | Status: DC | PRN
  Administered 2020-05-08: 50 ug/kg/min via INTRAVENOUS

## 2020-05-08 MED ORDER — ROCURONIUM BROMIDE 10 MG/ML (PF) SYRINGE
PREFILLED_SYRINGE | INTRAVENOUS | Status: AC
  Filled 2020-05-08: qty 10

## 2020-05-08 SURGICAL SUPPLY — 52 items
ADAPTER BRONCHOSCOPE OLYMP 190 (ADAPTER) ×2 IMPLANT
ADAPTER BRONCHOSCOPE OLYMPUS (ADAPTER) ×2 IMPLANT
ADAPTER VALVE BIOPSY EBUS (MISCELLANEOUS) IMPLANT
ADPR BSCP OLMPS EDG (ADAPTER) ×1
ADPR BSCP STRL LF REUSE (ADAPTER) ×1
ADPTR VALVE BIOPSY EBUS (MISCELLANEOUS)
BLADE CLIPPER SURG (BLADE) IMPLANT
BRUSH BIOPSY BRONCH 10 SDTNB (MISCELLANEOUS) ×2 IMPLANT
BRUSH CYTOL CELLEBRITY 1.5X140 (MISCELLANEOUS) IMPLANT
BRUSH SUPERTRAX BIOPSY (INSTRUMENTS) IMPLANT
BRUSH SUPERTRAX NDL-TIP CYTO (INSTRUMENTS) ×2 IMPLANT
CANISTER SUCT 3000ML PPV (MISCELLANEOUS) ×2 IMPLANT
CNTNR URN SCR LID CUP LEK RST (MISCELLANEOUS) ×2 IMPLANT
CONT SPEC 4OZ STRL OR WHT (MISCELLANEOUS) ×4
COVER BACK TABLE 60X90IN (DRAPES) ×2 IMPLANT
FILTER STRAW FLUID ASPIR (MISCELLANEOUS) ×2 IMPLANT
FORCEPS BIOP RJ4 1.8 (CUTTING FORCEPS) IMPLANT
FORCEPS BIOP SUPERTRX PREMAR (INSTRUMENTS) ×2 IMPLANT
FORCEPS RADIAL JAW LRG 4 PULM (INSTRUMENTS) IMPLANT
GAUZE SPONGE 4X4 12PLY STRL (GAUZE/BANDAGES/DRESSINGS) ×2 IMPLANT
GLOVE SURG SIGNA 7.5 PF LTX (GLOVE) ×2 IMPLANT
GOWN STRL REUS W/ TWL XL LVL3 (GOWN DISPOSABLE) ×2 IMPLANT
GOWN STRL REUS W/TWL XL LVL3 (GOWN DISPOSABLE) ×4
KIT CLEAN ENDO COMPLIANCE (KITS) ×4 IMPLANT
KIT ILLUMISITE 180 PROCEDURE (KITS) ×2 IMPLANT
KIT ILLUMISITE 90 PROCEDURE (KITS) IMPLANT
KIT TURNOVER KIT B (KITS) ×2 IMPLANT
MARKER SKIN DUAL TIP RULER LAB (MISCELLANEOUS) ×4 IMPLANT
NEEDLE ASPIRATION VIZISHOT 19G (NEEDLE) ×2 IMPLANT
NEEDLE ASPIRATION VIZISHOT 21G (NEEDLE) IMPLANT
NEEDLE BLUNT 18X1 FOR OR ONLY (NEEDLE) IMPLANT
NEEDLE SUPERTRX PREMARK BIOPSY (NEEDLE) IMPLANT
NS IRRIG 1000ML POUR BTL (IV SOLUTION) ×2 IMPLANT
OIL SILICONE PENTAX (PARTS (SERVICE/REPAIRS)) ×2 IMPLANT
PAD ARMBOARD 7.5X6 YLW CONV (MISCELLANEOUS) ×2 IMPLANT
PATCHES PATIENT (LABEL) ×6 IMPLANT
RADIAL JAW LRG 4 PULMONARY (INSTRUMENTS)
SYR 20ML ECCENTRIC (SYRINGE) ×4 IMPLANT
SYR 20ML LL LF (SYRINGE) ×2 IMPLANT
SYR 30ML LL (SYRINGE) IMPLANT
SYR 3ML LL SCALE MARK (SYRINGE) IMPLANT
SYR 5ML LL (SYRINGE) ×2 IMPLANT
SYR 5ML LUER SLIP (SYRINGE) IMPLANT
TOWEL GREEN STERILE (TOWEL DISPOSABLE) ×2 IMPLANT
TOWEL GREEN STERILE FF (TOWEL DISPOSABLE) ×2 IMPLANT
TRAP SPECIMEN MUCUS 40CC (MISCELLANEOUS) ×2 IMPLANT
TUBE CONNECTING 20X1/4 (TUBING) ×4 IMPLANT
UNDERPAD 30X36 HEAVY ABSORB (UNDERPADS AND DIAPERS) ×2 IMPLANT
VALVE BIOPSY  SINGLE USE (MISCELLANEOUS) ×4
VALVE BIOPSY SINGLE USE (MISCELLANEOUS) ×2 IMPLANT
VALVE SUCTION BRONCHIO DISP (MISCELLANEOUS) ×2 IMPLANT
WATER STERILE IRR 1000ML POUR (IV SOLUTION) ×2 IMPLANT

## 2020-05-08 NOTE — Discharge Instructions (Addendum)
General Anesthesia, Adult, Care After This sheet gives you information about how to care for yourself after your procedure. Your health care provider may also give you more specific instructions. If you have problems or questions, contact your health care provider. What can I expect after the procedure? After the procedure, the following side effects are common:  Pain or discomfort at the IV site.  Nausea.  Vomiting.  Sore throat.  Trouble concentrating.  Feeling cold or chills.  Weak or tired.  Sleepiness and fatigue.  Soreness and body aches. These side effects can affect parts of the body that were not involved in surgery. Follow these instructions at home:  For at least 24 hours after the procedure:  Have a responsible adult stay with you. It is important to have someone help care for you until you are awake and alert.  Rest as needed.  Do not: ? Participate in activities in which you could fall or become injured. ? Drive. ? Use heavy machinery. ? Drink alcohol. ? Take sleeping pills or medicines that cause drowsiness. ? Make important decisions or sign legal documents. ? Take care of children on your own. Eating and drinking  Follow any instructions from your health care provider about eating or drinking restrictions.  When you feel hungry, start by eating small amounts of foods that are soft and easy to digest (bland), such as toast. Gradually return to your regular diet.  Drink enough fluid to keep your urine pale yellow.  If you vomit, rehydrate by drinking water, juice, or clear broth. General instructions  If you have sleep apnea, surgery and certain medicines can increase your risk for breathing problems. Follow instructions from your health care provider about wearing your sleep device: ? Anytime you are sleeping, including during daytime naps. ? While taking prescription pain medicines, sleeping medicines, or medicines that make you drowsy.  Return to  your normal activities as told by your health care provider. Ask your health care provider what activities are safe for you.  Take over-the-counter and prescription medicines only as told by your health care provider.  If you smoke, do not smoke without supervision.  Keep all follow-up visits as told by your health care provider. This is important. Contact a health care provider if:  You have nausea or vomiting that does not get better with medicine.  You cannot eat or drink without vomiting.  You have pain that does not get better with medicine.  You are unable to pass urine.  You develop a skin rash.  You have a fever.  You have redness around your IV site that gets worse. Get help right away if:  You have difficulty breathing.  You have chest pain.  You have blood in your urine or stool, or you vomit blood. Summary  After the procedure, it is common to have a sore throat or nausea. It is also common to feel tired.  Have a responsible adult stay with you for the first 24 hours after general anesthesia. It is important to have someone help care for you until you are awake and alert.  When you feel hungry, start by eating small amounts of foods that are soft and easy to digest (bland), such as toast. Gradually return to your regular diet.  Drink enough fluid to keep your urine pale yellow.  Return to your normal activities as told by your health care provider. Ask your health care provider what activities are safe for you. This information is not  intended to replace advice given to you by your health care provider. Make sure you discuss any questions you have with your health care provider. Document Revised: 09/05/2017 Document Reviewed: 04/18/2017 Elsevier Patient Education  [place] not drive or engage in heavy physical activity for 24 hours.  You may resume normal activities tomorrow  Resume your normal medications  You may cough up small amounts of  blood over the next few days.   You may use acetaminophen (Tylenol) if needed for discomfort. You may use an over the counter cough medication if needed  My office will arrange an appointment for you with Oncology and Radiation Oncology to discuss treatment options.  Call [phone] if you develop chest pain, shortness of breath, or cough up more than 2 tablespoons of blood.

## 2020-05-08 NOTE — Brief Op Note (Signed)
05/08/2020  1:51 PM  PATIENT:  [name]  69 y.o. 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, clinical stage IIIA (T1N2)  POST-OPERATIVE DIAGNOSIS:  Right Upper Lung nodule, mediastinal adenopathy- Non-small cell carcinoma, clinical stage IIIA (T1,N2)  PROCEDURE:  Procedure(s): VIDEO BRONCHOSCOPY WITH ENDOBRONCHIAL NAVIGATION (N/A) VIDEO BRONCHOSCOPY WITH ENDOBRONCHIAL ULTRASOUND (N/A) Needle aspirations, brushings and transbronchial biopsies  SURGEON:  Surgeon(s) and Role:    * [name], MD - Primary  PHYSICIAN ASSISTANT:   ASSISTANTS: none   ANESTHESIA:   general  EBL:  10 mL   BLOOD ADMINISTERED:none  DRAINS: none   LOCAL MEDICATIONS USED:  NONE  SPECIMEN:  Source of Specimen:  Mediastinal node aspirations, RUL nodule  DISPOSITION OF SPECIMEN:  PATHOLOGY  COUNTS:  NO endoscopic  TOURNIQUET:  * No tourniquets in log *  DICTATION: .Other Dictation: Dictation Number -  PLAN OF CARE: Discharge to home after PACU  PATIENT DISPOSITION:  PACU - hemodynamically stable.   Delay start of Pharmacological VTE agent (>24hrs) due to surgical blood loss or risk of bleeding: not applicable

## 2020-05-08 NOTE — Transfer of Care (Signed)
Immediate Anesthesia Transfer of Care Note  Patient: [name]  Procedure(s) Performed: VIDEO BRONCHOSCOPY WITH ENDOBRONCHIAL NAVIGATION (N/A ) VIDEO BRONCHOSCOPY WITH ENDOBRONCHIAL ULTRASOUND (N/A )  Patient Location: PACU  Anesthesia Type:General  Level of Consciousness: drowsy  Airway & Oxygen Therapy: Patient Spontanous Breathing and Patient connected to face mask oxygen  Post-op Assessment: Report given to RN and Post -op Vital signs reviewed and stable  Post vital signs: Reviewed and stable  Last Vitals:  Vitals Value Taken Time  BP 113/65 05/08/20 1353  Temp    Pulse 72 05/08/20 1353  Resp 17 05/08/20 1353  SpO2 100 % 05/08/20 1353  Vitals shown include unvalidated device data.  Last Pain:  Vitals:   05/08/20 1040  TempSrc:   PainSc: 0-No pain      Patients Stated Pain Goal: 4 (16/10/96 0454)  Complications: No complications documented.

## 2020-05-08 NOTE — Anesthesia Postprocedure Evaluation (Signed)
Anesthesia Post Note  Patient: [name]  Procedure(s) Performed: VIDEO BRONCHOSCOPY WITH ENDOBRONCHIAL NAVIGATION (N/A ) VIDEO BRONCHOSCOPY WITH ENDOBRONCHIAL ULTRASOUND (N/A )     Patient location during evaluation: PACU Anesthesia Type: General Level of consciousness: awake and alert Pain management: pain level controlled Vital Signs Assessment: post-procedure vital signs reviewed and stable Respiratory status: spontaneous breathing, nonlabored ventilation, respiratory function stable and patient connected to nasal cannula oxygen Cardiovascular status: blood pressure returned to baseline and stable Postop Assessment: no apparent nausea or vomiting Anesthetic complications: no   No complications documented.  Last Vitals:  Vitals:   05/08/20 1410 05/08/20 1425  BP: (!) 101/59 (!) 106/59  Pulse: 66 66  Resp: 20 (!) 23  Temp:    SpO2: 94% 94%    Last Pain:  Vitals:   05/08/20 1425  TempSrc:   PainSc: 0-No pain                 [name]

## 2020-05-08 NOTE — Interval H&P Note (Signed)
History and Physical Interval Note:  05/08/2020 11:45 AM  [name]  has presented today for surgery, with the diagnosis of RUL lung nodule, mediastinal adenopathy.  The various methods of treatment have been discussed with the patient and family. After consideration of risks, benefits and other options for treatment, the patient has consented to  Procedure(s): VIDEO BRONCHOSCOPY WITH ENDOBRONCHIAL NAVIGATION (N/A) VIDEO BRONCHOSCOPY WITH ENDOBRONCHIAL ULTRASOUND (N/A) as a surgical intervention.  The patient's history has been reviewed, patient examined, no change in status, stable for surgery.  I have reviewed the patient's chart and labs.  Questions were answered to the patient's satisfaction.     [name]

## 2020-05-08 NOTE — Anesthesia Procedure Notes (Signed)
Procedure Name: Intubation Date/Time: 05/08/2020 12:12 PM Performed by: Imagene Riches, CRNA Pre-anesthesia Checklist: Patient identified, Emergency Drugs available, Suction available and Patient being monitored Patient Re-evaluated:Patient Re-evaluated prior to induction Oxygen Delivery Method: Circle System Utilized Preoxygenation: Pre-oxygenation with 100% oxygen Induction Type: IV induction Ventilation: Mask ventilation without difficulty Laryngoscope Size: Miller and 2 Grade View: Grade I Tube type: Oral Tube size: 8.5 mm Number of attempts: 1 Airway Equipment and Method: Stylet and Oral airway Placement Confirmation: ETT inserted through vocal cords under direct vision,  positive ETCO2 and breath sounds checked- equal and bilateral Secured at: 21 cm Tube secured with: Tape Dental Injury: Teeth and Oropharynx as per pre-operative assessment

## 2020-05-08 NOTE — Op Note (Signed)
NAME: [name], [name] MEDICAL RECORD [id number] ACCOUNT [id number] DATE OF BIRTH:10-06-1950 FACILITY: [facility] LOCATION: [facility] PHYSICIAN:Analicia Skibinski C. Marua Qin, MD  OPERATIVE REPORT  DATE OF PROCEDURE:  05/08/2020  PREOPERATIVE DIAGNOSIS:  Left upper lobe lung nodule with mediastinal adenopathy suspected stage IIIA (T1, N2) nonsmall cell carcinoma.  POSTOPERATIVE DIAGNOSIS:  Clinical stage IIIA (T1, N2) adenocarcinoma, right upper lobe.  PROCEDURE:  Endobronchial ultrasound with mediastinal lymph node aspirations and electromagnetic navigational bronchoscopy with brushings and biopsies.  SURGEON:  Modesto Charon, MD  ASSISTANT:  None.  ANESTHESIA:  General.  FINDINGS:  Quick prep of level 4R and right upper lobe brushings both showed nonsmall-cell carcinoma.  CLINICAL NOTE:  [name] is a 69 year old woman with a history of tobacco abuse and COPD.  She had a CT for lung cancer screening, which showed a spiculated right upper lobe lung nodule.  A PET CT showed the nodule was hypermetabolic, but there also  was hypermetabolic right paratracheal adenopathy.  She was advised to undergo navigational bronchoscopy and endobronchial ultrasound for diagnostic and staging purposes.  The indications, risks, benefits and alternatives were discussed in detail with the  patient.  She understood and accepted the risks and agreed to proceed.  DESCRIPTION OF PROCEDURE:  [name] was brought to the operating room on 05/08/2020.  She had induction of anesthesia and was intubated.  Sequential compression devices were placed on the calves for DVT prophylaxis.  She was given intravenous  antibiotics.  A timeout was performed.  Flexible fiberoptic bronchoscopy was performed via the endotracheal tube.  It revealed some thick clear secretions, but no endobronchial lesions to the level of the subsegmental bronchi.  The endobronchial ultrasound probe was advanced and a relatively large level 7  node was identified.  Aspirations were obtained from this node.  Multiple aspirations were taken using realtime ultrasound visualization.  Specimens were obtained, both with  and without suction applied.  Part of the specimen was placed on slides for quick prep and the remainder was placed into cell block.  Quick prep on this node showed no evidence of malignancy.  The scope then was repositioned to the right paratracheal  region.  An enlarged pathologic-appearing 4R lymph node was identified.  Aspirations were obtained from this node in a similar fashion and quick prep on that showed malignancy consistent with nonsmall cell carcinoma.  Multiple additional aspirations were  obtained and added for cell block.  There was some mild bleeding with the aspirations, which cleared with saline irrigation using the regular bronchoscope.  The bronchoscope was reinserted.  The locatable guide for navigation was placed.  Registration was performed.  There was good correlation of the video and virtual bronchoscopy.  The bronchoscope then was directed to the right upper lobe and the locatable guide  and catheter were advanced.  It was very difficult to get the catheter to go into the appropriate subsegmental bronchus.  This took approximately 20 minutes of manipulation, but finally it did go into the appropriate bronchus and the locatable guide was  navigated to 9 mm from the center of the nodule with good alignment.  Triple brushing was performed and a quick prep was performed on ensure that we were at the lesion.  The quick prep did show nonsmall cell carcinoma.  Multiple biopsies were obtained.  These  were all sent for permanent pathology.  Fluoroscopy was used with all sampling of the lung nodule, as well as some fluoroscopy during manipulation of the catheter.  The total fluoroscopy time  was 3.7 minutes.  The dose was 28 milligray.  A final inspection  was made with the scope and there was no ongoing bleeding.  The  bronchoscope was removed.  The patient was extubated in the operating room and taken to the [facility] in good condition.  VN/NUANCE  D:05/08/2020 T:05/08/2020 JOB:[id number]

## 2020-05-08 NOTE — Anesthesia Preprocedure Evaluation (Addendum)
Anesthesia Evaluation  Patient identified by MRN, date of birth, ID band Patient awake    Reviewed: Allergy & Precautions, NPO status , Patient's Chart, lab work & pertinent test results  History of Anesthesia Complications Negative for: history of anesthetic complications  Airway Mallampati: I  TM Distance: >3 FB Neck ROM: Full    Dental  (+) Edentulous Upper, Upper Dentures, Dental Advisory Given   Pulmonary neg shortness of breath, COPD, neg recent URI, Current Smoker and Patient abstained from smoking.,  RUL lung nodule, mediastinal adenopathy   breath sounds clear to auscultation       Cardiovascular negative cardio ROS   Rhythm:Regular     Neuro/Psych negative neurological ROS  negative psych ROS   GI/Hepatic negative GI ROS, Neg liver ROS,   Endo/Other  negative endocrine ROS  Renal/GU negative Renal ROS     Musculoskeletal negative musculoskeletal ROS (+)   Abdominal   Peds  Hematology negative hematology ROS (+)   Anesthesia Other Findings   Reproductive/Obstetrics                            Anesthesia Physical Anesthesia Plan  ASA: II  Anesthesia Plan: General   Post-op Pain Management:    Induction: Intravenous  PONV Risk Score and Plan: 2 and Ondansetron and Dexamethasone  Airway Management Planned: Oral ETT  Additional Equipment: None  Intra-op Plan:   Post-operative Plan: Extubation in OR  Informed Consent: I have reviewed the patients History and Physical, chart, labs and discussed the procedure including the risks, benefits and alternatives for the proposed anesthesia with the patient or authorized representative who has indicated his/her understanding and acceptance.     Dental advisory given  Plan Discussed with: CRNA and Surgeon  Anesthesia Plan Comments:         Anesthesia Quick Evaluation

## 2020-05-09 ENCOUNTER — Telehealth: Payer: Self-pay | Admitting: Physician Assistant

## 2020-05-09 ENCOUNTER — Encounter (HOSPITAL_COMMUNITY): Payer: Self-pay | Admitting: Thoracic Surgery (Cardiothoracic Vascular Surgery)

## 2020-05-09 ENCOUNTER — Encounter: Payer: Self-pay | Admitting: *Deleted

## 2020-05-09 DIAGNOSIS — R911 Solitary pulmonary nodule: Secondary | ICD-10-CM

## 2020-05-09 NOTE — Telephone Encounter (Signed)
Received a new hem referral from Dr. Roxan Hockey for solitary pulmonary nodule. [name] has been cld and scheduled to see Cassie on 8/31 at 130pm w/labs at 1pm. Ot aware to arrive 15 minutes early.

## 2020-05-09 NOTE — Progress Notes (Signed)
Thoracic Location of Tumor / Histology: RUL Lung  Patient presented for routine lung cancer screening scan.  Bronchoscopy 05/08/2020: Path pending  PET 06/09/2682: Hypermetabolic 1.1 cm right upper lobe pulmonary nodule with hypermetabolic right upper paratracheal adenopathy. Equivocal accentuated activity in a right supraclavicular lymph node which is not enlarged. 6 by 5 mm right middle lobe nodule is not hypermetabolic but is below sensitive PET-CT size thresholds.  Assuming non-small cell lung cancer and assuming that the right middle lobe nodule and supraclavicular lymph node are benign, this represents T1b N2 M0 disease (stage IIIA).  CT Chest 03/10/2020:  Lung-RADS 4X, suspicious right upper lobe pulmonary lesion with right paratracheal lymphadenopathy. Additional imaging evaluation or consultation with Pulmonology or Thoracic Surgery recommended.  Biopsies of   Tobacco/Marijuana/Snuff/ETOH use: Current Smoker  Past/Anticipated interventions by cardiothoracic surgery, if any:  Dr. Roxan Hockey 04/25/2020 -I reviewed the images with Mr. and Mrs. Bouler and we discussed options for treatment.  They understand that surgery alone would not be considered curative.   -I think her best option is to treat with neoadjuvant chemotherapy with or without radiation and then proceed with surgical resection if there is a response. -First we need to establish a diagnosis.  I recommended we do a navigational bronchoscopy and endobronchial ultrasound for diagnosis and staging.  I informed her of the general nature of the procedure.   Past/Anticipated interventions by medical oncology, if any:  PA Cassie/ Dr. Julien Nordmann 05/16/2020   Signs/Symptoms  Weight changes, if any: No  Respiratory complaints, if any: Mild SOB  Hemoptysis, if any: non-productive cough, no blood noted.  Pain issues, if any:  Upper right side back pain, takes tylenol.  SAFETY ISSUES:  Prior radiation? No  Pacemaker/ICD?  No  Possible current pregnancy? Postmenopausal  Is the patient on methotrexate? No  Current Complaints / other details:   -Ha received covid vaccination: Moderna

## 2020-05-09 NOTE — Progress Notes (Signed)
I received a referral on [name] today.  I updated new patient scheduler to call and schedule her to be seen next week.

## 2020-05-10 ENCOUNTER — Ambulatory Visit
Admission: RE | Admit: 2020-05-10 | Discharge: 2020-05-10 | Disposition: A | Discharge status: Home / Self Care | Payer: Medicare Other | Source: Ambulatory Visit | Attending: Radiation Oncology | Admitting: Radiation Oncology

## 2020-05-10 ENCOUNTER — Other Ambulatory Visit: Payer: Self-pay

## 2020-05-10 ENCOUNTER — Encounter: Payer: Self-pay | Admitting: *Deleted

## 2020-05-10 ENCOUNTER — Encounter: Payer: Self-pay | Admitting: Radiation Oncology

## 2020-05-10 ENCOUNTER — Telehealth: Payer: Self-pay | Admitting: *Deleted

## 2020-05-10 VITALS — Ht 62.5 in | Wt 120.0 lb

## 2020-05-10 DIAGNOSIS — C3490 Malignant neoplasm of unspecified part of unspecified bronchus or lung: Secondary | ICD-10-CM

## 2020-05-10 DIAGNOSIS — C3411 Malignant neoplasm of upper lobe, right bronchus or lung: Secondary | ICD-10-CM

## 2020-05-10 LAB — CYTOLOGY - NON PAP

## 2020-05-10 LAB — SURGICAL PATHOLOGY

## 2020-05-10 NOTE — Progress Notes (Signed)
Radiation Oncology         [phone] ________________________________  Initial Outpatient Consultation - Conducted via telephone due to current COVID-19 concerns for limiting patient exposure  I spoke with the patient to conduct this consult visit via telephone to spare the patient unnecessary potential exposure in the healthcare setting during the current COVID-19 pandemic. The patient was notified in advance and was offered a [place] meeting to allow for face to face communication but unfortunately reported that they did not have the appropriate resources/technology to support such a visit and instead preferred to proceed with a telephone consult.   Name: [name]        MRN: [id number]  Date of Service: 05/10/2020 DOB: 1951-09-16  EP:PIRJJOA, [place], [place]  [name], *     REFERRING PHYSICIAN: [name], *   DIAGNOSIS: The encounter diagnosis was Malignant neoplasm of upper lobe of right lung ([place]).   HISTORY OF PRESENT ILLNESS: [name] is a 69 y.o. female seen at the request of Dr. Roxan Hockey for a probable stage III lung cancer.  The patient had been evaluated through the lung cancer screening clinic in which time, a CT scan of the chest on 03/10/2020 revealed a 10.8 mm irregular pulmonary nodule in the medial right upper lobe, as well as a 5 irregular nodule in the right middle lobe.  Within the chest there was also a 13 mm right paratracheal lymph node, and other normal-sized mediastinal nodes.  Her exam was limited by the lack of IV contrast regarding further lymphadenopathy assessment.  She underwent pet imaging on 04/06/2020 which revealed hypermetabolic activity within the right upper lobe nodule measuring 1.1 cm, SUV was 4.8.  Several hypermetabolic lymph nodes in the right paratracheal station were identified measuring up to 1.1 cm with an SUV of 8, the smaller more cephalad right paratracheal nodes were also hypermetabolic, a right supraclavicular  node was 4 mm with an SUV of 2.9 cm, and a 6 x 5 mm right middle lobe nodule was below threshold by size, with an SUV of 0.5.  There was also intramural density in the cecum measuring 1.6 cm felt to be most likely stool with an SUV of 7.6, though a mass could not be excluded. She also has stigmata of atherosclerotic disease as well as emphysema, cholelithiasis, and a supraumbilical hernia containing fat tissue.  She met with Dr. Roxan Hockey on 05/08/2020 underwent bronchoscopy.  Multiple specimens were obtained however the biopsy results and cytology are unavailable at this time.  The patient is seen today out of concern that she has stage III disease and would need neoadjuvant chemotherapy versus neoadjuvant chemoradiation and then surgery.    PREVIOUS RADIATION THERAPY: No   PAST MEDICAL HISTORY:  Past Medical History:  Diagnosis Date  . Cataract   . COPD (chronic obstructive pulmonary disease) ([place])        PAST SURGICAL HISTORY: Past Surgical History:  Procedure Laterality Date  . APPENDECTOMY  2008  . COLON SURGERY  2008   colostomy-infection post op appendectomy  . COLOSTOMY REVERSAL  2009  . VIDEO BRONCHOSCOPY WITH ENDOBRONCHIAL NAVIGATION N/A 05/08/2020   Procedure: VIDEO BRONCHOSCOPY WITH ENDOBRONCHIAL NAVIGATION;  Surgeon: [name], MD;  Location: [facility];  Service: Thoracic;  Laterality: N/A;  . VIDEO BRONCHOSCOPY WITH ENDOBRONCHIAL ULTRASOUND N/A 05/08/2020   Procedure: VIDEO BRONCHOSCOPY WITH ENDOBRONCHIAL ULTRASOUND;  Surgeon: [name], MD;  Location: [facility];  Service: Thoracic;  Laterality: N/A;  FAMILY HISTORY:  Family History  Problem Relation Age of Onset  . Mental illness Mother   . Colon cancer Mother 19  . Cancer Father   . Colon polyps Neg Hx   . Esophageal cancer Neg Hx   . Stomach cancer Neg Hx   . Rectal cancer Neg Hx      SOCIAL HISTORY:  reports that she has been smoking cigarettes. She has been smoking about 0.50 packs per  day. She has never used smokeless tobacco. She reports current alcohol use of about 10.0 standard drinks of alcohol per week. She reports that she does not use drugs. The patient is married and lives in [place].   ALLERGIES: Patient has no known allergies.   MEDICATIONS:  Current Outpatient Medications  Medication Sig Dispense Refill  . albuterol (PROVENTIL HFA;VENTOLIN HFA) 108 (90 Base) MCG/ACT inhaler Inhale 2 puffs into the lungs every 4 (four) hours as needed for wheezing or shortness of breath (cough, shortness of breath or wheezing.). 1 Inhaler 1  . Calcium Carbonate (CALCIUM 600 PO) Take 600 mg by mouth 2 (two) times daily.     . Cholecalciferol (VITAMIN D3 PO) Take 2,000 Units by mouth daily.    . fluticasone (FLONASE) 50 MCG/ACT nasal spray Place 2 sprays into both nostrils daily. 16 g 12  . Fluticasone-Salmeterol (ADVAIR) 250-50 MCG/DOSE AEPB Inhale 1 puff into the lungs 2 (two) times daily.     . Multiple Vitamin (MULTIVITAMIN) tablet Take 1 tablet by mouth daily.    . prednisoLONE acetate (PRED FORTE) 1 % ophthalmic suspension Place 1 drop into the left eye in the morning, at noon, and at bedtime.     No current facility-administered medications for this encounter.     REVIEW OF SYSTEMS: On review of systems, the patient reports that she is doing well overall. She denies any chest pain. She does have occasional shortness of breath, but denies hemoptysis or productive cough. She has had some right sided back discomfort and takes tylenol for this. She denies  fevers, chills, night sweats, unintended weight changes. She denies any bowel or bladder disturbances, and denies abdominal pain, nausea or vomiting. She denies any new musculoskeletal or joint aches or pains. A complete review of systems is obtained and is otherwise negative.     PHYSICAL EXAM:  Wt Readings from Last 3 Encounters:  05/08/20 120 lb (54.4 kg)  05/05/20 120 lb 14.4 oz (54.8 kg)  04/25/20 119 lb (54 kg)     Unable to assess due to encounter type.   ECOG = 1  0 - Asymptomatic (Fully active, able to carry on all predisease activities without restriction)  1 - Symptomatic but completely ambulatory (Restricted in physically strenuous activity but ambulatory and able to carry out work of a light or sedentary nature. For example, light housework, office work)  2 - Symptomatic, <50% in bed during the day (Ambulatory and capable of all self care but unable to carry out any work activities. Up and about more than 50% of waking hours)  3 - Symptomatic, >50% in bed, but not bedbound (Capable of only limited self-care, confined to bed or chair 50% or more of waking hours)  4 - Bedbound (Completely disabled. Cannot carry on any self-care. Totally confined to bed or chair)  5 - Death   Eustace Pen MM, Creech RH, Tormey DC, et al. [phone]). "Toxicity and response criteria of the [facility] Group". [place] Oncol. 5 (6): 649-55    LABORATORY  DATA:  Lab Results  Component Value Date   WBC 8.0 05/05/2020   HGB 14.9 05/05/2020   HCT 46.4 (H) 05/05/2020   MCV 100.2 (H) 05/05/2020   PLT 211 05/05/2020   Lab Results  Component Value Date   NA 140 05/05/2020   K 4.0 05/05/2020   CL 103 05/05/2020   CO2 25 05/05/2020   Lab Results  Component Value Date   ALT 16 05/05/2020   AST 21 05/05/2020   ALKPHOS 68 05/05/2020   BILITOT 0.8 05/05/2020      RADIOGRAPHY: DG Chest 2 View  Result Date: 05/05/2020 CLINICAL DATA:  69 year old female with history of pulmonary nodule. Preprocedural study prior to endobronchial navigation. EXAM: CHEST - 2 VIEW COMPARISON:  Chest x-ray 10/29/2017. FINDINGS: Lung volumes are normal. No consolidative airspace disease. No pleural effusions. No pneumothorax. No pulmonary nodule or mass noted. Pulmonary vasculature and the cardiomediastinal silhouette are within normal limits. Atherosclerosis in the thoracic aorta. IMPRESSION: 1.  No radiographic evidence  of acute cardiopulmonary disease. 2. Aortic atherosclerosis. Electronically Signed   By: Vinnie Langton M.D.   On: 05/05/2020 16:00   DG C-ARM BRONCHOSCOPY  Result Date: 05/08/2020 C-ARM BRONCHOSCOPY: Fluoroscopy was utilized by the requesting physician.  No radiographic interpretation.       IMPRESSION/PLAN: 1. Probable Stage IIIA, cT1bN2Mx NSCLC, of the RUL. Dr. Lisbeth Renshaw discusses the pathology findings and reviews the nature of locally advanced lung cancer.  He discusses the continued need to prove that diagnosis so we will await the results of her biopsy. We did discuss the rationale for systemic treatment as well as surgery, and also the rationale for chemoradiation including the mediastinal pretracheal and  Supraclavicular nodes.  She does still need to have a MRI of the brain as well to rule out metastatic disease.  We will follow-up with these results when available. We discussed the risks, benefits, short, and long term effects of radiotherapy. Dr. Lisbeth Renshaw discusses the delivery and logistics of radiotherapy and anticipates a course of 6 1/2 weeks of radiotherapy. She is interested in moving forward. She will simulate on 05/16/20 and we anticipate starting on 05/23/20. Her case will also be discussed in thoracic oncology conference tomorrow and we will share discussion with her by phone afterwards.  2. Covid Vaccination. The patient received her Moderna vaccines in January and February of this year. We discussed the importance of proceeding with her third dose and that she could proceed with this now given her plans for treatment. She will reach out to the local pharmacies as we only currently [facility].  Given current concerns for patient exposure during the COVID-19 pandemic, this encounter was conducted via telephone.  The patient has provided two factor identification and has given verbal consent for this type of encounter and has been advised to only accept a meeting of this type in a secure  network environment. The time spent during this encounter was 60 minutes including preparation, discussion, and coordination of the patient's care. The attendants for this meeting include Blenda Nicely, RN, Dr. Lisbeth Renshaw, Hayden Pedro  and Charlies Silvers.  During the encounter,  Blenda Nicely, RN, Dr. Lisbeth Renshaw, and Hayden Pedro were located at [facility] Radiation Oncology Department.  Beckey Rutter Dowland was located at home.   The above documentation reflects my direct findings during this shared patient visit. Please see the separate note by Dr. Lisbeth Renshaw on this date for the remainder of the patient's plan of care.  Carola Rhine, PAC

## 2020-05-10 NOTE — Progress Notes (Signed)
[facility] Psychosocial Distress Screening Clinical Social Work  Clinical Social Work was referred by distress screening protocol.  The patient scored a 6 on the Psychosocial Distress Thermometer which indicates moderate distress. Clinical Social Worker contacted patient by phone to assess for distress and other psychosocial needs.  Patient stated she was feeling a little overwhelmed, but overall was doing well.  CSW provided education on CSW role, and information on the support team and support services at [facility].  CSW and patient discussed common feelings and emotions when being diagnosed with cancer, and the importance of support.  Patient identified her husband, family and church family as positive support.  Patient was appreciative of CSW contact, and requested to be added to the support calendar mailing list.  CSW provided contact information and encouraged patient to cal with questions or concerns.     ONCBCN DISTRESS SCREENING 05/10/2020  Screening Type Initial Screening  Distress experienced in past week (1-10) 6  Emotional problem type Adjusting to illness  Information Concerns Type Lack of info about treatment  Physical Problem type Pain  Other Contact via phone    Johnnye Lana, MSW, LCSW, OSW-C Clinical Social Worker [place] [phone]

## 2020-05-10 NOTE — Telephone Encounter (Signed)
CALLED PATIENT TO INFORM OF MRI FOR 05-18-20 - ARRIVAL TIME- 12:30 PM @ [facility], NO RESTRICTIONS TO TEST, SPOKE WITH PATIENT AND SHE IS AWARE OF THIS TEST.

## 2020-05-11 ENCOUNTER — Telehealth: Payer: Self-pay | Admitting: Radiation Oncology

## 2020-05-11 NOTE — Telephone Encounter (Signed)
I called the patient to review our conversation from thoracic oncology conference. Dr. Julien Nordmann has recommended 2 cycles of chemotherapy, hopefully with surgery to follow rather than chemoRT. I have cancelled our simulation plans and we will be available as needed to consider radiotherapy if needed in the future.    Carola Rhine, PAC

## 2020-05-13 NOTE — Progress Notes (Signed)
[place] [phone]   [phone]  CONSULT NOTE  REFERRING PHYSICIAN: Dr. Roxan Hockey  REASON FOR CONSULTATION:  Stage IIIA non-small cell carcinoma, Adenocarcinoma   HPI [name] is a 69 y.o. female the past medical history of COPD, diverticulitis, and osteopenia  is referred to the clinic for evaluation of a right upper lobe lung nodule consistent with non-small cell lung cancer, adenocarcinoma.  The patient's evaluation began on 03/10/2020 when she had a low-dose lung cancer screening CT scan which demonstrated a right upper lobe pulmonary lesion with right paratracheal lymphadenopathy.  The patient then had a PET scan performed on 04/06/2020 which showed a hypermetabolic 1.1 cm right upper lobe pulmonary nodule with hypermetabolic right upper lobe paratracheal adenopathy.  There is equivocal activity in the right supraclavicular lymph node which was not enlarged.  There was also a 6 x 5 mm right middle lobe pulmonary nodule that was not hypermetabolic but was also below the sensitive PET/CT size threshold.  The patient was then seen by surgeon, Dr. Roxan Hockey on 04/25/2020 who recommended a bronchoscopy and biopsy.  The patient had a bronchoscopy/biopsy on 05/08/2020 which the pathology was consistent with adenocarcinoma.  The patient is scheduled for her staging brain MRI on 05/18/20  The patient's case was discussed at the multidisciplinary conference which was determined that the patient would undergo 3 rounds of chemotherapy to try to downstage her disease and follow this with possible surgical resection.  Overall, the patient is feeling well today. She denies fevers, chills, night sweats, or weight loss. She denies nausea, vomiting, diarrhea, or constipation. She denies headaches or visual changes. She denies chest pain shortness of breath, cough, or hemoptysis.  The patient's family history consists of a mother who had colorectal cancer and  is currently residing in a memory care unit for dementia.  The patient's father also had cancer which they believe was in his neck.  The patient's maternal grandfather had esophageal cancer.  The patient did secretarial work in the past.  She is married.  She has 1 daughter who has passed away.  She has a Psychiatrist.  The patient has smoked for approximately 40 to 50 years averaging a little over a pack per day.  Presently, her and her husband are both trying to quit and have cut back to 1 to 2 cigarettes/day.  The patient denies any history of drug use.  The patient drinks 1-2 beers daily a couple times a week.   HPI  Past Medical History:  Diagnosis Date  . Cataract   . COPD (chronic obstructive pulmonary disease) ([place])     Past Surgical History:  Procedure Laterality Date  . APPENDECTOMY  2008  . COLON SURGERY  2008   colostomy-infection post op appendectomy  . COLOSTOMY REVERSAL  2009  . VIDEO BRONCHOSCOPY WITH ENDOBRONCHIAL NAVIGATION N/A 05/08/2020   Procedure: VIDEO BRONCHOSCOPY WITH ENDOBRONCHIAL NAVIGATION;  Surgeon: [name], MD;  Location: [facility];  Service: Thoracic;  Laterality: N/A;  . VIDEO BRONCHOSCOPY WITH ENDOBRONCHIAL ULTRASOUND N/A 05/08/2020   Procedure: VIDEO BRONCHOSCOPY WITH ENDOBRONCHIAL ULTRASOUND;  Surgeon: [name], MD;  Location: [facility];  Service: Thoracic;  Laterality: N/A;    Family History  Problem Relation Age of Onset  . Mental illness Mother   . Colon cancer Mother 15  . Cancer Father   . Colon polyps Neg Hx   . Esophageal cancer Neg Hx   . Stomach cancer Neg Hx   .  Rectal cancer Neg Hx     Social History Social History   Tobacco Use  . Smoking status: Current Every Day Smoker    Packs/day: 0.50    Types: Cigarettes  . Smokeless tobacco: Never Used  Vaping Use  . Vaping Use: Never used  Substance Use Topics  . Alcohol use: Yes    Alcohol/week: 10.0 standard drinks    Types: 10 Cans of beer per week  . Drug  use: No    No Known Allergies  Current Outpatient Medications  Medication Sig Dispense Refill  . albuterol (PROVENTIL HFA;VENTOLIN HFA) 108 (90 Base) MCG/ACT inhaler Inhale 2 puffs into the lungs every 4 (four) hours as needed for wheezing or shortness of breath (cough, shortness of breath or wheezing.). 1 Inhaler 1  . Calcium Carbonate (CALCIUM 600 PO) Take 600 mg by mouth 2 (two) times daily.     . Cholecalciferol (VITAMIN D3 PO) Take 2,000 Units by mouth daily.    . fluticasone (FLONASE) 50 MCG/ACT nasal spray Place 2 sprays into both nostrils daily. 16 g 12  . Fluticasone-Salmeterol (ADVAIR) 250-50 MCG/DOSE AEPB Inhale 1 puff into the lungs 2 (two) times daily.     . Multiple Vitamin (MULTIVITAMIN) tablet Take 1 tablet by mouth daily.    . prednisoLONE acetate (PRED FORTE) 1 % ophthalmic suspension Place 1 drop into the left eye in the morning, at noon, and at bedtime.     No current facility-administered medications for this visit.    Review of Systems   REVIEW OF SYSTEMS:   Review of Systems  Constitutional: Negative for appetite change, chills, fatigue, fever and unexpected weight change.  HENT: Negative for mouth sores, nosebleeds, sore throat and trouble swallowing.   Eyes: Negative for eye problems and icterus.  Respiratory: Negative for cough, hemoptysis, shortness of breath and wheezing.   Cardiovascular: Negative for chest pain and leg swelling.  Gastrointestinal: Negative for abdominal pain, constipation, diarrhea, nausea and vomiting.  Genitourinary: Negative for bladder incontinence, difficulty urinating, dysuria, frequency and hematuria.   Musculoskeletal: Negative for back pain, gait problem, neck pain and neck stiffness.  Skin: Negative for itching and rash.  Neurological: Negative for dizziness, extremity weakness, gait problem, headaches, light-headedness and seizures.  Hematological: Negative for adenopathy. Does not bruise/bleed easily.    Psychiatric/Behavioral: Negative for confusion, depression and sleep disturbance. The patient is not nervous/anxious.     PHYSICAL EXAMINATION:  Blood pressure (!) 110/97, pulse 78, temperature (!) 97 F (36.1 C), temperature source Tympanic, resp. rate 17, height 5' 2.5" (1.588 m), weight 122 lb 12.8 oz (55.7 kg), SpO2 100 %.  ECOG PERFORMANCE STATUS: 0  Physical Exam  Constitutional: Oriented to person, place, and time and well-developed, well-nourished, and in no distress. HENT:  Head: Normocephalic and atraumatic.  Mouth/Throat: Oropharynx is clear and moist. No oropharyngeal exudate.  Eyes: Conjunctivae are normal. Right eye exhibits no discharge. Left eye exhibits no discharge. No scleral icterus.  Neck: Normal range of motion. Neck supple.  Cardiovascular: Normal rate, regular rhythm, normal heart sounds and intact distal pulses.   Pulmonary/Chest: Effort normal and breath sounds normal. No respiratory distress. No wheezes. No rales.  Abdominal: Soft. Bowel sounds are normal. Exhibits no distension and no mass. There is no tenderness.  Musculoskeletal: Normal range of motion. Exhibits no edema.  Lymphadenopathy:    No cervical adenopathy.  Neurological: Alert and oriented to person, place, and time. Exhibits normal muscle tone. Gait normal. Coordination normal.  Skin: Skin is warm and  dry. No rash noted. Not diaphoretic. No erythema. No pallor.  Psychiatric: Mood, memory and judgment normal.  Vitals reviewed.  LABORATORY DATA: Lab Results  Component Value Date   WBC 8.0 05/05/2020   HGB 14.9 05/05/2020   HCT 46.4 (H) 05/05/2020   MCV 100.2 (H) 05/05/2020   PLT 211 05/05/2020      Chemistry      Component Value Date/Time   NA 140 05/05/2020 0929   NA 135 10/29/2017 1131   K 4.0 05/05/2020 0929   CL 103 05/05/2020 0929   CO2 25 05/05/2020 0929   BUN 15 05/05/2020 0929   BUN 5 (L) 10/29/2017 1131   CREATININE 0.66 05/05/2020 0929      Component Value Date/Time    CALCIUM 9.1 05/05/2020 0929   ALKPHOS 68 05/05/2020 0929   AST 21 05/05/2020 0929   ALT 16 05/05/2020 0929   BILITOT 0.8 05/05/2020 0929   BILITOT 0.4 10/29/2017 1131       RADIOGRAPHIC STUDIES: DG Chest 2 View  Result Date: 05/05/2020 CLINICAL DATA:  69 year old female with history of pulmonary nodule. Preprocedural study prior to endobronchial navigation. EXAM: CHEST - 2 VIEW COMPARISON:  Chest x-ray 10/29/2017. FINDINGS: Lung volumes are normal. No consolidative airspace disease. No pleural effusions. No pneumothorax. No pulmonary nodule or mass noted. Pulmonary vasculature and the cardiomediastinal silhouette are within normal limits. Atherosclerosis in the thoracic aorta. IMPRESSION: 1.  No radiographic evidence of acute cardiopulmonary disease. 2. Aortic atherosclerosis. Electronically Signed   By: Vinnie Langton M.D.   On: 05/05/2020 16:00   DG C-ARM BRONCHOSCOPY  Result Date: 05/08/2020 C-ARM BRONCHOSCOPY: Fluoroscopy was utilized by the requesting physician.  No radiographic interpretation.    ASSESSMENT: This is a very pleasant 69 year old Caucasian female referred to the clinic for evaluation of non-small cell lung cancer, adenocarcinoma.  The patient presented with right upper lobe lung nodule and right paratracheal lymphadenopathy.  The patient was diagnosed in August 2021.   PLAN: The patient was seen with Dr. Julien Nordmann today.  Dr. Julien Nordmann had a lengthy discussion with the patient about her current condition and treatment options.  Dr. Julien Nordmann recommended the patient undergo 3 rounds of chemotherapy/immunotherapy to try to downstage her disease.  if the patient has a good response to chemotherapy, he will likely recommend that the patient follow up with Dr. Roxan Hockey, cardiothoracic surgeon, for consideration of surgical resection.  Dr. Julien Nordmann recommended the patient undergo systemic chemotherapy with carboplatin for an AUC of 5, immunotherapy with Keytruda 200 mg IV, and  Alimta 500 mg per metered squared IV every 3 weeks.  Upon completion of this treatment, we will do a restaging scan and decide at that time if the patient will undergo surgical resection versus concurrent chemoradiation.  The patient is a interested in proceeding with 3 cycles of systemic chemotherapy/immunotherapy and she is expected to receive her first dose on 05/30/2020.  We discussed the adverse side effects of treatment including but not limited to alopecia, myelosuppression, nausea and vomiting, peripheral neuropathy, liver or renal dysfunction as well as immunotherapy mediated adverse effects.   I will arrange for the patient to have a chemoeducation class prior to receiving her first cycle of chemotherapy.   We will arrange for the patient to have a B12 injection while in the clinic today.     I sent prescriptions for 1 mg folic acid p.o. daily as well as Compazine 10 mg every 6 hours as needed for nausea.  The patient will follow-up in  3 weeks for a one-week follow-up visit after completing her first cycle of chemotherapy.  She was instructed to keep her appointment for her brain MRI on 05/18/2020 as scheduled  The patient voices understanding of current disease status and treatment options and is in agreement with the current care plan.  All questions were answered. The patient knows to call the clinic with any problems, questions or concerns. We can certainly see the patient much sooner if necessary.  Thank you so much for allowing me to participate in the care of REDINA ZELLER. I will continue to follow up the patient with you and assist in her care.  The total time spent in the appointment was 60 minutes.  Disclaimer: This note was dictated with voice recognition software. Similar sounding words can inadvertently be transcribed and may not be corrected upon review.   Jamilya Sarrazin L Dequarius Jeffries May 13, 2020, 5:12 PM  ADDENDUM: Hematology/Oncology Attending: I had a  face-to-face encounter with the patient today.  I recommended her care plan.  This is a very pleasant 69 years old white female with past medical history significant for COPD and diverticulosis as well as long history for smoking.  The patient was seen by her pulmonologist Dr. Melvyn Novas and she had CT screening of the chest because of the smoking history.  The scan was performed on 03/10/2020 and it showed 1.1 cm irregular pulmonary nodule identified in the medial right upper lobe.  There was also a 0.5 cm irregular nodule noted in the right middle lobe.  The scan also showed 1.3 cm short axis right paratracheal node.  A PET scan was performed on 04/06/2020 and it showed a hypermetabolic 1.1 cm right upper lobe pulmonary nodule with hypermetabolic right upper paratracheal adenopathy and the equivalent accentuated activity in the right supraclavicular node which is not enlarged.  There was also a 0.5 x 0.6 right middle lobe nodule that is not hypermetabolic but below the PET sensitivity. The patient was referred to Dr. Roxan Hockey and on 05/08/2020 she underwent bronchoscopy, electromagnetic navigation bronchoscopy with brushing and biopsy as well as endobronchial ultrasound with mediastinal lymph node aspiration.  The final pathology ([id number]) showed malignant cells consistent with adenocarcinoma. Dr. Roxan Hockey kindly referred the patient to me today for evaluation and discussion of neoadjuvant chemotherapy before consideration of surgical resection. When seen today the patient is feeling fine with no concerning issues. This is a pleasant 69 years old with a stage IIIa (T1b, N2, M0) non-small cell lung cancer, adenocarcinoma presented with right upper lobe lung nodule as well as right upper paratracheal adenopathy and suspicious tiny nodule in the right middle lobe. I had a lengthy discussion with the patient and her husband today about her current condition and treatment options. I recommended for the patient  a course of 3 cycles of neoadjuvant systemic chemotherapy with carboplatin for AUC of 5, Alimta 500 mg/M2 and Keytruda 200 mg IV every 3 weeks based on recent data presented at [facility] 2021 with a similar regiment with nivolumab and there was marked pathologic response and a good percentage of the patient on the study.  If the patient has good response to this treatment she will be referred back to Dr. Roxan Hockey for surgical resection otherwise we will proceed with a course of concurrent chemoradiation after the neoadjuvant therapy. The patient and her husband agreed to the current plan. We discussed with him the adverse effect of this treatment including but not limited to alopecia, myelosuppression, nausea and vomiting, peripheral neuropathy,  liver or renal dysfunction as well as immunotherapy adverse effects. The patient is expected to start the first cycle of this treatment next week. She will receive vitamin B12 injection today and we will send prescription for folic acid and Compazine to her pharmacy.  She will also have a chemotherapy education class before the start of her treatment. The patient will come back for follow-up visit in 2 weeks for evaluation and management of any adverse effect of her treatment. The patient was advised to call immediately if she has any concerning symptoms in the interval.  Disclaimer: This note was dictated with voice recognition software. Similar sounding words can inadvertently be transcribed and may be missed upon review. Eilleen Kempf, MD 05/16/20

## 2020-05-16 ENCOUNTER — Inpatient Hospital Stay: Payer: Medicare Other | Attending: Physician Assistant | Admitting: Physician Assistant

## 2020-05-16 ENCOUNTER — Inpatient Hospital Stay: Payer: Medicare Other

## 2020-05-16 ENCOUNTER — Other Ambulatory Visit: Payer: Self-pay

## 2020-05-16 ENCOUNTER — Other Ambulatory Visit: Payer: Self-pay | Admitting: Internal Medicine

## 2020-05-16 ENCOUNTER — Encounter: Payer: Self-pay | Admitting: Physician Assistant

## 2020-05-16 ENCOUNTER — Ambulatory Visit: Payer: Medicare Other | Admitting: Radiation Oncology

## 2020-05-16 VITALS — BP 110/97 | HR 78 | Temp 97.0°F | Resp 17 | Ht 62.5 in | Wt 122.8 lb

## 2020-05-16 DIAGNOSIS — J449 Chronic obstructive pulmonary disease, unspecified: Secondary | ICD-10-CM | POA: Diagnosis not present

## 2020-05-16 DIAGNOSIS — R911 Solitary pulmonary nodule: Secondary | ICD-10-CM

## 2020-05-16 DIAGNOSIS — Z800 Family history of malignant neoplasm of digestive organs: Secondary | ICD-10-CM | POA: Diagnosis not present

## 2020-05-16 DIAGNOSIS — C3411 Malignant neoplasm of upper lobe, right bronchus or lung: Secondary | ICD-10-CM | POA: Diagnosis not present

## 2020-05-16 DIAGNOSIS — Z7189 Other specified counseling: Secondary | ICD-10-CM

## 2020-05-16 DIAGNOSIS — F17210 Nicotine dependence, cigarettes, uncomplicated: Secondary | ICD-10-CM | POA: Diagnosis not present

## 2020-05-16 LAB — CMP (CANCER CENTER ONLY)
ALT: 18 U/L (ref 0–44)
AST: 17 U/L (ref 15–41)
Albumin: 3.7 g/dL (ref 3.5–5.0)
Alkaline Phosphatase: 81 U/L (ref 38–126)
Anion gap: 7 (ref 5–15)
BUN: 17 mg/dL (ref 8–23)
CO2: 28 mmol/L (ref 22–32)
Calcium: 9.7 mg/dL (ref 8.9–10.3)
Chloride: 106 mmol/L (ref 98–111)
Creatinine: 0.64 mg/dL (ref 0.44–1.00)
GFR, Est AFR Am: >60 mL/min (ref >60)
GFR, Estimated: >60 mL/min (ref >60)
Glucose, Bld: 96 mg/dL (ref 70–99)
Potassium: 4.1 mmol/L (ref 3.5–5.1)
Sodium: 141 mmol/L (ref 135–145)
Total Bilirubin: 0.4 mg/dL (ref 0.3–1.2)
Total Protein: 6.6 g/dL (ref 6.5–8.1)

## 2020-05-16 LAB — CBC WITH DIFFERENTIAL (CANCER CENTER ONLY)
Abs Immature Granulocytes: 0.04 10*3/uL (ref 0.00–0.07)
Basophils Absolute: 0.1 10*3/uL (ref 0.0–0.1)
Basophils Relative: 1 %
Eosinophils Absolute: 0.1 10*3/uL (ref 0.0–0.5)
Eosinophils Relative: 1 %
HCT: 40.9 % (ref 36.0–46.0)
Hemoglobin: 13.3 g/dL (ref 12.0–15.0)
Immature Granulocytes: 1 %
Lymphocytes Relative: 24 %
Lymphs Abs: 1.7 10*3/uL (ref 0.7–4.0)
MCH: 31.7 pg (ref 26.0–34.0)
MCHC: 32.5 g/dL (ref 30.0–36.0)
MCV: 97.4 fL (ref 80.0–100.0)
Monocytes Absolute: 0.8 10*3/uL (ref 0.1–1.0)
Monocytes Relative: 10 %
Neutro Abs: 4.7 10*3/uL (ref 1.7–7.7)
Neutrophils Relative %: 63 %
Platelet Count: 225 10*3/uL (ref 150–400)
RBC: 4.2 MIL/uL (ref 3.87–5.11)
RDW: 12.3 % (ref 11.5–15.5)
WBC Count: 7.3 10*3/uL (ref 4.0–10.5)
nRBC: 0 % (ref 0.0–0.2)

## 2020-05-16 MED ORDER — CYANOCOBALAMIN 1000 MCG/ML IJ SOLN
INTRAMUSCULAR | Status: AC
  Filled 2020-05-16: qty 1

## 2020-05-16 MED ORDER — FOLIC ACID 1 MG PO TABS: 1.0000 mg | ORAL_TABLET | Freq: Every day | ORAL | 2 refills | Status: DC

## 2020-05-16 MED ORDER — CYANOCOBALAMIN 1000 MCG/ML IJ SOLN
1000.0000 ug | Freq: Once | INTRAMUSCULAR | Status: AC
  Administered 2020-05-16: 1000 ug via INTRAMUSCULAR

## 2020-05-16 MED ORDER — PROCHLORPERAZINE MALEATE 10 MG PO TABS: 10.0000 mg | ORAL_TABLET | Freq: Four times a day (QID) | ORAL | 2 refills | Status: DC | PRN

## 2020-05-16 NOTE — Patient Instructions (Addendum)
Summary:  -There are two main categories of lung cancer, they are named based on the size of the cancer cell. One is called Non-Small cell lung cancer. The other type is Small Cell Lung Cancer -The sample (biopsy) that they took of your tumor was consistent with a subtype of Non-small cell lung cancer called Adenocarcinoma. This is the most common type of lung cancer.  -We covered a lot of important information at your appointment today regarding what the treatment plan is moving forward. Here are the the main points that were discussed at your office visit with [place] today:  -We would like to try to shrink your tumor so you can eventually get surgery.  -The treatment that you will receive consists of two chemotherapy drugs, called Carboplatin and Alimta (also called Pemetrexed) and one immunotherapy drug called Keytruda (pembrolizumab).  -We are planning on starting your treatment next week on 05/24/20 but before your start your treatment, I would like you to attend a Chemotherapy Education Class. This involves having you sit down with one of our nurse educators. She will discuss with your one-on-one more details about your treatment as well as general information about resources here at the cancer center.  -Your treatment will be given once every 3 weeks for about 3 treatments.  -You will need to do blood work weekly to keep a close eye on your blood work.  -We will decide what to do after you complete these 3 rounds when we will have a repeat imaging study. It will depend on your repeat imaging studies to see if we can get you to surgery vs. Do chemotherapy and radiation treatment.   Medications:  -I have sent a few important medication prescriptions to your pharmacy.  -Compazine was sent to your pharmacy. This medication is for nausea. You may take this every 6 hours as needed if you feel nauseous.  -I have also sent a prescription for 1 mg of folic acid to your pharmacy. We need you to take 1 tablet  every day.  -We will administer vitamin B12 every 9 weeks while you are here in the clinic. You have received your first dose today.   Referrals or Imaging: -Please keep your appointment for you brain MRI as planned on 05/18/20   Follow up:  -We will see you back for a follow up visit 1 week after your first treatment to see how it went and help manage any side effects of treatment that you may have   -If you need to reach [place] at any time, the main office number to the cancer center is [phone], when you call, ask to speak to either Cassie's or Dr. Worthy Flank nurse.

## 2020-05-16 NOTE — Progress Notes (Signed)
START OFF PATHWAY REGIMEN - Non-Small Cell Lung   [id number]:Pembrolizumab 200 mg  IV D1 + Pemetrexed 500 mg/m2 IV D1 + Carboplatin AUC=5 IV D1 q21 Days:   A cycle is every 21 days:     Pembrolizumab      Pemetrexed      Carboplatin   **Always confirm dose/schedule in your pharmacy ordering system**  Patient Characteristics: Preoperative or Nonsurgical Candidate (Clinical Staging), Stage IIIA - Surgical Candidate, Neoadjuvant Chemotherapy Preferred, Nonsquamous Cell Therapeutic Status: Preoperative or Nonsurgical Candidate (Clinical Staging) AJCC T Category: cT1b AJCC N Category: cN2 AJCC M Category: cM0 AJCC 8 Stage Grouping: IIIA Histology: Nonsquamous Cell Intent of Therapy: Curative Intent, Discussed with Patient

## 2020-05-18 ENCOUNTER — Ambulatory Visit (HOSPITAL_COMMUNITY)
Admission: RE | Admit: 2020-05-18 | Discharge: 2020-05-18 | Disposition: A | Discharge status: Home / Self Care | Payer: Medicare Other | Source: Ambulatory Visit | Attending: Radiation Oncology | Admitting: Radiation Oncology

## 2020-05-18 ENCOUNTER — Telehealth: Payer: Self-pay | Admitting: Medical Oncology

## 2020-05-18 ENCOUNTER — Other Ambulatory Visit: Payer: Self-pay

## 2020-05-18 ENCOUNTER — Other Ambulatory Visit: Payer: Self-pay | Admitting: *Deleted

## 2020-05-18 DIAGNOSIS — C3490 Malignant neoplasm of unspecified part of unspecified bronchus or lung: Secondary | ICD-10-CM

## 2020-05-18 IMAGING — MR MR HEAD WO/W CM
14 series · 48 of 48 positions shown · IV contrast (gadavist)
Comparison: None.

CLINICAL DATA: Staging of non-small cell lung cancer

EXAM:
MRI HEAD WITHOUT AND WITH CONTRAST
TECHNIQUE: Multiplanar, multiecho pulse sequences of the brain and surrounding
structures were obtained without and with intravenous contrast.
CONTRAST:  5mL GADAVIST GADOBUTROL 1 MMOL/ML IV SOLN

[Series 5: DWI · axial · 3.0mm · 1.36mm/px · z∈[-27,+126]mm · 5 of 104 slices shown (1 of 4)]
[im 1/104]
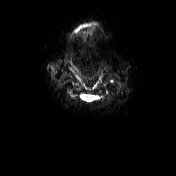
[im 26/104]
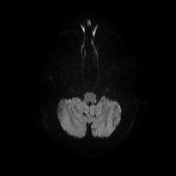
[im 52/104]
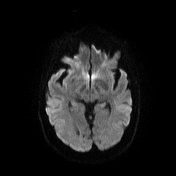
[im 78/104]
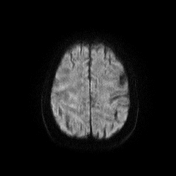
[im 104/104]
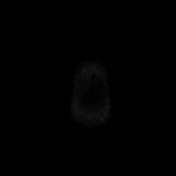

[Series 6: DWI · axial · 3.0mm · 1.36mm/px · z∈[-27,+126]mm · 3 of 52 slices shown (2 of 4)]
[im 1/52]
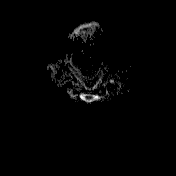
[im 26/52]
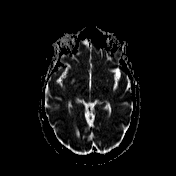
[im 52/52]
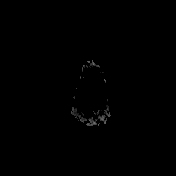

[Series 7: T1 · sagittal · 5.0mm · 0.75mm/px · 1 of 24 slices shown (1 of 2)]
[im 1/24]
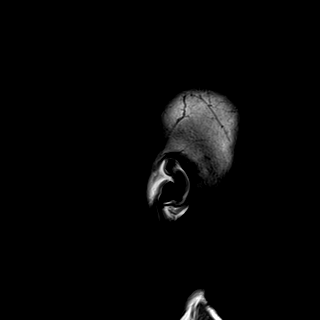

[Series 8: T2 · axial · 5.0mm · 0.62mm/px · 1 of 24 slices shown]
[im 1/24]
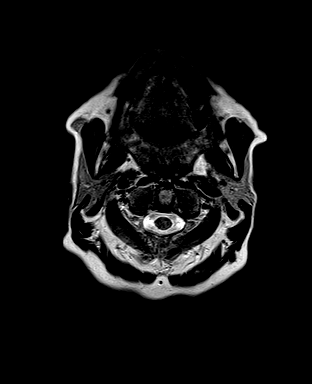

[Series 9: mip_images(sw) · axial · 24.0mm · 0.75mm/px · z∈[-11,+121]mm · 3 of 45 slices shown]
[im 1/45]
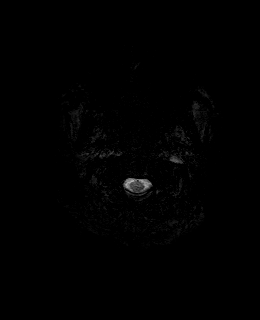
[im 23/45]
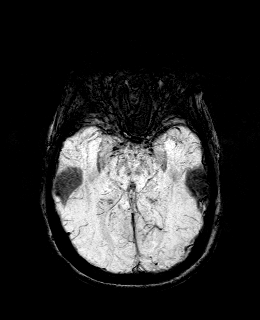
[im 45/45]
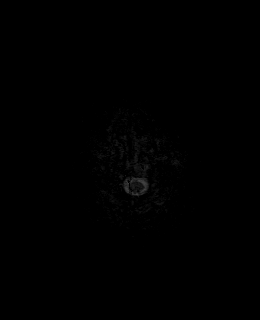

[Series 10: swi_images · axial · 3.0mm · 0.75mm/px · z∈[-22,+131]mm · 3 of 52 slices shown]
[im 1/52]
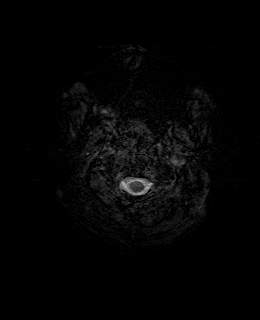
[im 26/52]
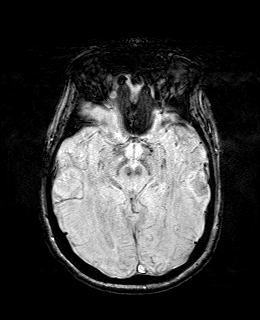
[im 52/52]
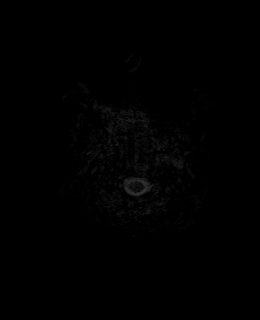

[Series 11: FLAIR · axial · 3.0mm · 0.75mm/px · z∈[-22,+131]mm · 3 of 52 slices shown]
[im 1/52]
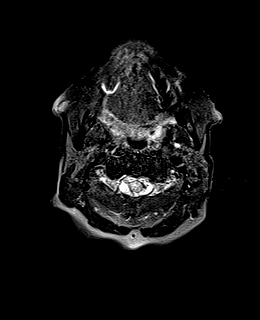
[im 26/52]
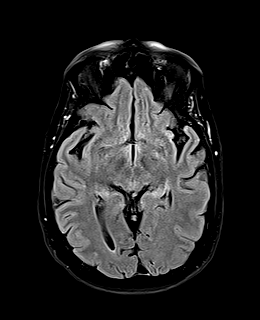
[im 52/52]
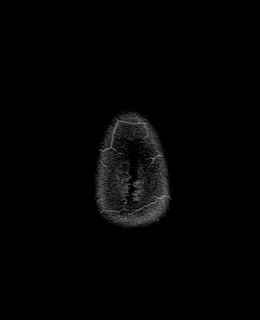

[Series 12: T1 · axial · 1.0mm · 0.94mm/px · z∈[-25,+134]mm · 9 of 160 slices shown (2 of 2)]
[im 1/160]
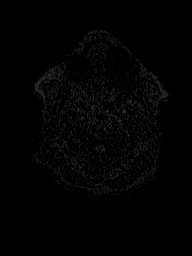
[im 20/160]
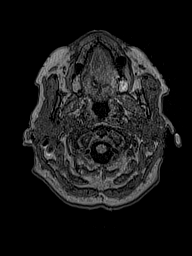
[im 40/160]
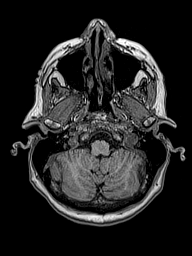
[im 60/160]
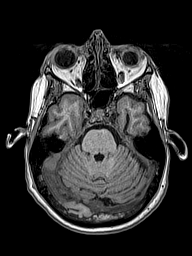
[im 80/160]
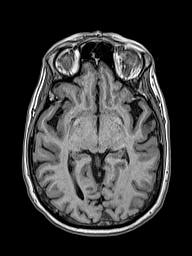
[im 100/160]
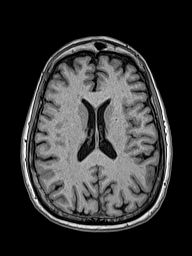
[im 120/160]
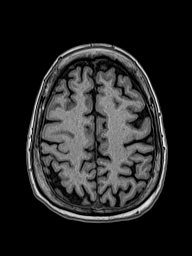
[im 140/160]
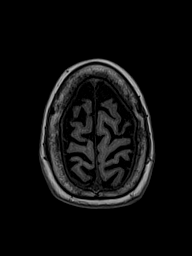
[im 160/160]
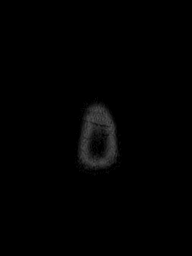

[Series 13: DWI · coronal · 5.0mm · 1.31mm/px · 4 of 72 slices shown (3 of 4)]
[im 1/72]
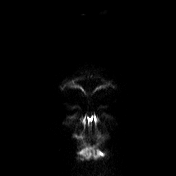
[im 24/72]
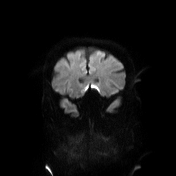
[im 48/72]
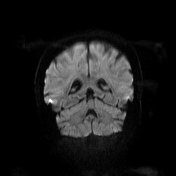
[im 72/72]
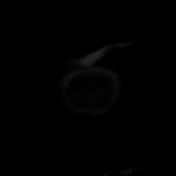

[Series 14: DWI · coronal · 5.0mm · 1.31mm/px · 2 of 36 slices shown (4 of 4)]
[im 1/36]
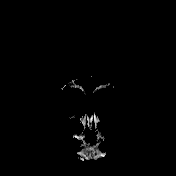
[im 36/36]
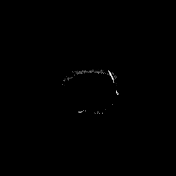

[Series 15: T2 post-contrast · coronal · 5.0mm · 0.57mm/px · 2 of 28 slices shown]
[im 1/28]
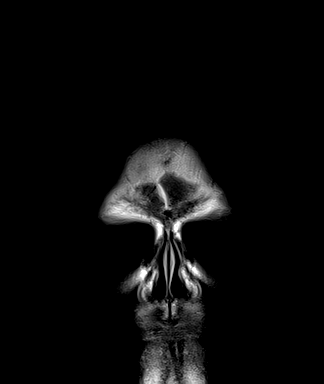
[im 28/28]
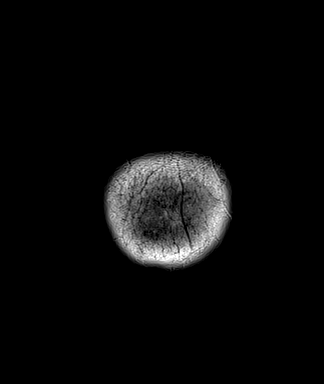

[Series 16: T1 post-contrast · axial · 1.0mm · 0.94mm/px · z∈[-25,+134]mm · 9 of 160 slices shown (1 of 3)]
[im 1/160]
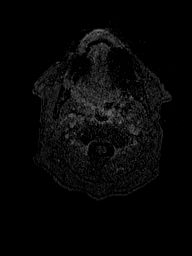
[im 20/160]
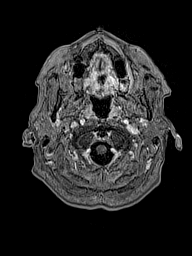
[im 40/160]
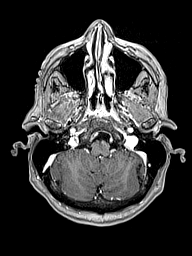
[im 60/160]
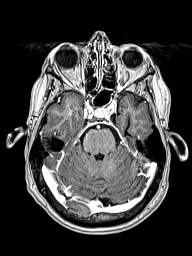
[im 80/160]
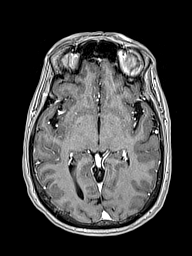
[im 100/160]
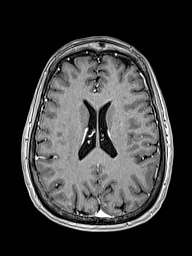
[im 120/160]
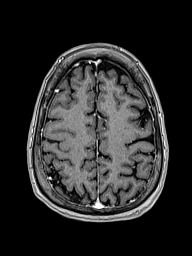
[im 140/160]
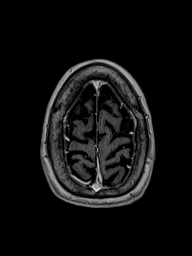
[im 160/160]
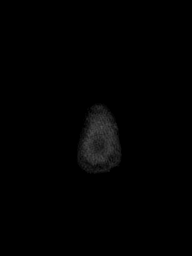

[Series 17: T1 post-contrast · coronal · 5.0mm · 0.43mm/px · 2 of 28 slices shown (2 of 3)]
[im 1/28]
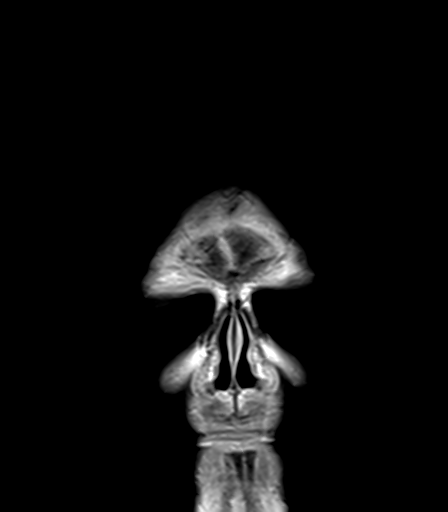
[im 28/28]
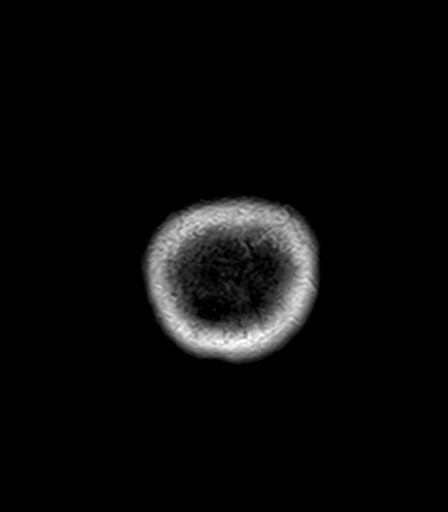

[Series 18: T1 post-contrast · sagittal · 5.0mm · 0.75mm/px · 1 of 24 slices shown (3 of 3)]
[im 1/24]
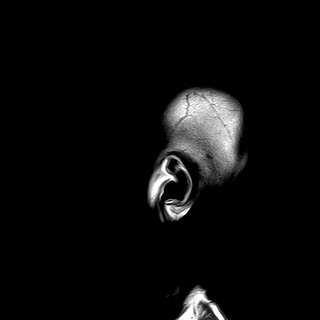

[48 of 48 positions shown; findings below may reference images not displayed]

FINDINGS: Brain: No acute infarct, acute hemorrhage or extra-axial collection.
Multifocal hyperintense T2-weighted signal within the white matter.
Normal volume of CSF spaces. No chronic microhemorrhage. Normal
midline structures. There is no abnormal contrast enhancement.

Vascular: Normal flow voids.

Skull and upper cervical spine: Normal marrow signal.

Sinuses/Orbits: Negative.

Other: None.
IMPRESSION: 1. No intracranial metastatic disease.
2. Chronic microvascular ischemia.

## 2020-05-18 MED ORDER — GADOBUTROL 1 MMOL/ML IV SOLN
5.0000 mL | Freq: Once | INTRAVENOUS | Status: AC | PRN
  Administered 2020-05-18: 5 mL via INTRAVENOUS

## 2020-05-18 NOTE — Telephone Encounter (Signed)
Pt aware of schedule.

## 2020-05-18 NOTE — Progress Notes (Signed)
The proposed treatment discussed in cancer conference is for discussion purpose only and is not a binding recommendation. The patient was not physically examined nor present for their treatment options. Therefore, final treatment plans cannot be decided.  ?

## 2020-05-18 NOTE — Telephone Encounter (Signed)
LM about schedule next week and to return my call.

## 2020-05-19 ENCOUNTER — Encounter: Payer: Self-pay | Admitting: Radiation Oncology

## 2020-05-19 NOTE — Progress Notes (Signed)
I called the patient this morning to let her know her reassuring MRI scan results of her brain.  She will continue to follow-up with Dr. Julien Nordmann and we will be here to help as needed.

## 2020-05-23 ENCOUNTER — Encounter: Payer: Self-pay | Admitting: *Deleted

## 2020-05-23 ENCOUNTER — Encounter: Payer: Self-pay | Admitting: Internal Medicine

## 2020-05-23 ENCOUNTER — Inpatient Hospital Stay: Payer: Medicare Other

## 2020-05-23 ENCOUNTER — Inpatient Hospital Stay: Payer: Medicare Other | Attending: Physician Assistant

## 2020-05-23 ENCOUNTER — Other Ambulatory Visit: Payer: Self-pay

## 2020-05-23 ENCOUNTER — Ambulatory Visit: Payer: Medicare Other | Admitting: Radiation Oncology

## 2020-05-23 ENCOUNTER — Inpatient Hospital Stay: Payer: Medicare Other | Admitting: Internal Medicine

## 2020-05-23 VITALS — BP 128/65 | HR 70 | Temp 97.7°F | Resp 18 | Ht 62.5 in | Wt 123.4 lb

## 2020-05-23 DIAGNOSIS — Z5111 Encounter for antineoplastic chemotherapy: Secondary | ICD-10-CM

## 2020-05-23 DIAGNOSIS — Z79899 Other long term (current) drug therapy: Secondary | ICD-10-CM | POA: Diagnosis not present

## 2020-05-23 DIAGNOSIS — R590 Localized enlarged lymph nodes: Secondary | ICD-10-CM | POA: Insufficient documentation

## 2020-05-23 DIAGNOSIS — J449 Chronic obstructive pulmonary disease, unspecified: Secondary | ICD-10-CM | POA: Insufficient documentation

## 2020-05-23 DIAGNOSIS — C3411 Malignant neoplasm of upper lobe, right bronchus or lung: Secondary | ICD-10-CM | POA: Diagnosis not present

## 2020-05-23 DIAGNOSIS — Z7189 Other specified counseling: Secondary | ICD-10-CM | POA: Diagnosis not present

## 2020-05-23 DIAGNOSIS — Z5112 Encounter for antineoplastic immunotherapy: Secondary | ICD-10-CM | POA: Diagnosis not present

## 2020-05-23 LAB — CBC WITH DIFFERENTIAL (CANCER CENTER ONLY)
Abs Immature Granulocytes: 0.03 10*3/uL (ref 0.00–0.07)
Basophils Absolute: 0.1 10*3/uL (ref 0.0–0.1)
Basophils Relative: 1 %
Eosinophils Absolute: 0.1 10*3/uL (ref 0.0–0.5)
Eosinophils Relative: 1 %
HCT: 40.1 % (ref 36.0–46.0)
Hemoglobin: 13 g/dL (ref 12.0–15.0)
Immature Granulocytes: 0 %
Lymphocytes Relative: 25 %
Lymphs Abs: 1.8 10*3/uL (ref 0.7–4.0)
MCH: 31.7 pg (ref 26.0–34.0)
MCHC: 32.4 g/dL (ref 30.0–36.0)
MCV: 97.8 fL (ref 80.0–100.0)
Monocytes Absolute: 0.6 10*3/uL (ref 0.1–1.0)
Monocytes Relative: 8 %
Neutro Abs: 4.6 10*3/uL (ref 1.7–7.7)
Neutrophils Relative %: 65 %
Platelet Count: 243 10*3/uL (ref 150–400)
RBC: 4.1 MIL/uL (ref 3.87–5.11)
RDW: 12.2 % (ref 11.5–15.5)
WBC Count: 7.1 10*3/uL (ref 4.0–10.5)
nRBC: 0 % (ref 0.0–0.2)

## 2020-05-23 LAB — CMP (CANCER CENTER ONLY)
ALT: 18 U/L (ref 0–44)
AST: 17 U/L (ref 15–41)
Albumin: 3.8 g/dL (ref 3.5–5.0)
Alkaline Phosphatase: 81 U/L (ref 38–126)
Anion gap: 6 (ref 5–15)
BUN: 13 mg/dL (ref 8–23)
CO2: 29 mmol/L (ref 22–32)
Calcium: 9.5 mg/dL (ref 8.9–10.3)
Chloride: 106 mmol/L (ref 98–111)
Creatinine: 0.66 mg/dL (ref 0.44–1.00)
GFR, Est AFR Am: >60 mL/min (ref >60)
GFR, Estimated: >60 mL/min (ref >60)
Glucose, Bld: 85 mg/dL (ref 70–99)
Potassium: 4.7 mmol/L (ref 3.5–5.1)
Sodium: 141 mmol/L (ref 135–145)
Total Bilirubin: 0.5 mg/dL (ref 0.3–1.2)
Total Protein: 7 g/dL (ref 6.5–8.1)

## 2020-05-23 LAB — TSH: TSH: 1.087 u[IU]/mL (ref 0.308–3.960)

## 2020-05-23 NOTE — Progress Notes (Signed)
Oncology Nurse Navigator Documentation  Oncology Nurse Navigator Flowsheets 05/23/2020  Abnormal Finding Date 03/10/2020  Confirmed Diagnosis Date 05/08/2020  Diagnosis Status Confirmed Diagnosis Complete  Planned Course of Treatment Chemotherapy  Phase of Treatment Chemo  Chemotherapy Actual Start Date: 05/30/2020  Navigator Follow Up Date: 05/30/2020  Navigator Follow Up Reason: Chemotherapy  Navigator Location [facility]-  Navigator Encounter Type Clinic/[facility]  Treatment Initiated Date 05/30/2020  Patient Visit Type MedOnc  Treatment Phase Pre-Tx/Tx Discussion  Barriers/Navigation Needs Education  Education Newly Diagnosed Cancer Education;Smoking cessation;Other  Interventions Education;Psycho-Social Support  Acuity Level 4-High Needs (Greater Than 4 Barriers Identified)  Education Method Verbal;Written  Support Groups/Services Other  Time Spent with Patient 30

## 2020-05-23 NOTE — Progress Notes (Signed)
[facility] [phone]   [phone]  OFFICE PROGRESS NOTE  [name], [address] [place] [address]  DIAGNOSIS: Stage IIIA (T1b, N2, M0) non-small cell lung cancer, adenocarcinoma presented with right upper lobe lung nodule as well as right upper paratracheal adenopathy and suspicious tiny nodule in the right middle lobe.  PRIOR THERAPY: None.   CURRENT THERAPY: Neoadjuvant systemic chemotherapy with carboplatin for AUC of 5, Alimta 500 mg/M2 and Keytruda 200 mg IV every 3 weeks.  First dose 05/30/2020.  INTERVAL HISTORY: [name] 69 y.o. female returns to the clinic today for follow-up visit.  The patient is feeling fine today with no concerning complaints.  She denied having any chest pain, shortness of breath, cough or hemoptysis.  She denied having any nausea, vomiting, diarrhea or constipation.  She has no headache or visual changes.  She had MRI of the brain performed recently and she is here for evaluation and discussion of her MRI results.  MEDICAL HISTORY: Past Medical History:  Diagnosis Date  . Cataract   . COPD (chronic obstructive pulmonary disease) (HCC)     ALLERGIES:  has No Known Allergies.  MEDICATIONS:  Current Outpatient Medications  Medication Sig Dispense Refill  . albuterol (PROVENTIL HFA;VENTOLIN HFA) 108 (90 Base) MCG/ACT inhaler Inhale 2 puffs into the lungs every 4 (four) hours as needed for wheezing or shortness of breath (cough, shortness of breath or wheezing.). 1 Inhaler 1  . Calcium Carbonate (CALCIUM 600 PO) Take 600 mg by mouth 2 (two) times daily.     . Cholecalciferol (VITAMIN D3 PO) Take 2,000 Units by mouth daily.    . fluticasone (FLONASE) 50 MCG/ACT nasal spray Place 2 sprays into both nostrils daily. 16 g 12  . Fluticasone-Salmeterol (ADVAIR) 250-50 MCG/DOSE AEPB Inhale 1 puff into the lungs 2 (two) times daily.     . folic acid (FOLVITE) 1 MG tablet Take 1 tablet  (1 mg total) by mouth daily. 30 tablet 2  . Multiple Vitamin (MULTIVITAMIN) tablet Take 1 tablet by mouth daily.    . prednisoLONE acetate (PRED FORTE) 1 % ophthalmic suspension Place 1 drop into the left eye in the morning, at noon, and at bedtime.    . prochlorperazine (COMPAZINE) 10 MG tablet Take 1 tablet (10 mg total) by mouth every 6 (six) hours as needed. 30 tablet 2   No current facility-administered medications for this visit.    SURGICAL HISTORY:  Past Surgical History:  Procedure Laterality Date  . APPENDECTOMY  2008  . COLON SURGERY  2008   colostomy-infection post op appendectomy  . COLOSTOMY REVERSAL  2009  . VIDEO BRONCHOSCOPY WITH ENDOBRONCHIAL NAVIGATION N/A 05/08/2020   Procedure: VIDEO BRONCHOSCOPY WITH ENDOBRONCHIAL NAVIGATION;  Surgeon: [name], MD;  Location: [facility];  Service: Thoracic;  Laterality: N/A;  . VIDEO BRONCHOSCOPY WITH ENDOBRONCHIAL ULTRASOUND N/A 05/08/2020   Procedure: VIDEO BRONCHOSCOPY WITH ENDOBRONCHIAL ULTRASOUND;  Surgeon: [name], MD;  Location: [facility];  Service: Thoracic;  Laterality: N/A;    REVIEW OF SYSTEMS:  A comprehensive review of systems was negative.   PHYSICAL EXAMINATION: General appearance: alert, cooperative and no distress Head: Normocephalic, without obvious abnormality, atraumatic Neck: no adenopathy, no JVD, supple, symmetrical, trachea midline and thyroid not enlarged, symmetric, no tenderness/mass/nodules Lymph nodes: Cervical, supraclavicular, and axillary nodes normal. Resp: clear to auscultation bilaterally Back: symmetric, no curvature. ROM normal. No CVA tenderness. Cardio: regular rate and rhythm,  S1, S2 normal, no murmur, click, rub or gallop GI: soft, non-tender; bowel sounds normal; no masses,  no organomegaly Extremities: extremities normal, atraumatic, no cyanosis or edema  ECOG PERFORMANCE STATUS: 1 - Symptomatic but completely ambulatory  Blood pressure 128/65, pulse 70, temperature  97.7 F (36.5 C), temperature source Tympanic, resp. rate 18, height 5' 2.5" (1.588 m), weight 123 lb 6.4 oz (56 kg), SpO2 100 %.  LABORATORY DATA: Lab Results  Component Value Date   WBC 7.1 05/23/2020   HGB 13.0 05/23/2020   HCT 40.1 05/23/2020   MCV 97.8 05/23/2020   PLT 243 05/23/2020      Chemistry      Component Value Date/Time   NA 141 05/16/2020 1251   NA 135 10/29/2017 1131   K 4.1 05/16/2020 1251   CL 106 05/16/2020 1251   CO2 28 05/16/2020 1251   BUN 17 05/16/2020 1251   BUN 5 (L) 10/29/2017 1131   CREATININE 0.64 05/16/2020 1251      Component Value Date/Time   CALCIUM 9.7 05/16/2020 1251   ALKPHOS 81 05/16/2020 1251   AST 17 05/16/2020 1251   ALT 18 05/16/2020 1251   BILITOT 0.4 05/16/2020 1251       RADIOGRAPHIC STUDIES: DG Chest 2 View  Result Date: 05/05/2020 CLINICAL DATA:  69 year old female with history of pulmonary nodule. Preprocedural study prior to endobronchial navigation. EXAM: CHEST - 2 VIEW COMPARISON:  Chest x-ray 10/29/2017. FINDINGS: Lung volumes are normal. No consolidative airspace disease. No pleural effusions. No pneumothorax. No pulmonary nodule or mass noted. Pulmonary vasculature and the cardiomediastinal silhouette are within normal limits. Atherosclerosis in the thoracic aorta. IMPRESSION: 1.  No radiographic evidence of acute cardiopulmonary disease. 2. Aortic atherosclerosis. Electronically Signed   By: Vinnie Langton M.D.   On: 05/05/2020 16:00   MR Brain W Wo Contrast  Result Date: 05/18/2020 CLINICAL DATA:  Staging of non-small cell lung cancer EXAM: MRI HEAD WITHOUT AND WITH CONTRAST TECHNIQUE: Multiplanar, multiecho pulse sequences of the brain and surrounding structures were obtained without and with intravenous contrast. CONTRAST:  50mL GADAVIST GADOBUTROL 1 MMOL/ML IV SOLN COMPARISON:  None. FINDINGS: Brain: No acute infarct, acute hemorrhage or extra-axial collection. Multifocal hyperintense T2-weighted signal within the  white matter. Normal volume of CSF spaces. No chronic microhemorrhage. Normal midline structures. There is no abnormal contrast enhancement. Vascular: Normal flow voids. Skull and upper cervical spine: Normal marrow signal. Sinuses/Orbits: Negative. Other: None. IMPRESSION: 1. No intracranial metastatic disease. 2. Chronic microvascular ischemia. Electronically Signed   By: Ulyses Jarred M.D.   On: 05/18/2020 20:15   DG C-ARM BRONCHOSCOPY  Result Date: 05/08/2020 C-ARM BRONCHOSCOPY: Fluoroscopy was utilized by the requesting physician.  No radiographic interpretation.    ASSESSMENT AND PLAN: This is a very pleasant 69 years old white female recently diagnosed with a stage IIIa (T1b, N2, M0) non-small cell lung cancer, adenocarcinoma presented with right upper lobe lung nodule in addition to mediastinal lymphadenopathy. The patient had MRI of the brain performed recently that showed no evidence of intracranial metastatic disease. She is scheduled for neoadjuvant treatment with carboplatin for AUC of 5, Alimta 500 mg/M2 and Keytruda 200 mg IV every 3 weeks for 3 cycles.  The first cycle of her treatment schedule on 05/30/2020. The patient was advised to continue with folic acid on a daily basis. She will have a chemotherapy education class before the first dose of her treatment. She will come back for follow-up visit in 2 weeks for evaluation and  management of any adverse effect of her treatment. The patient was advised to call immediately if she has any concerning symptoms in the interval. The patient voices understanding of current disease status and treatment options and is in agreement with the current care plan.  All questions were answered. The patient knows to call the clinic with any problems, questions or concerns. We can certainly see the patient much sooner if necessary.  The total time spent in the appointment was 20 minutes.  Disclaimer: This note was dictated with voice recognition  software. Similar sounding words can inadvertently be transcribed and may not be corrected upon review.

## 2020-05-23 NOTE — Progress Notes (Signed)
I spoke with [name] today.  She is newly dx lung cancer who will received neoadjuvant chemo.  I gave and explained information on her dx and treatment.  Patient is still smoking but has weaned down to one a day.  I gave her information on smoking cessation.  She is really working on quitting.  I asked that she reach out to me if she continues to struggle with smoking.

## 2020-05-24 ENCOUNTER — Ambulatory Visit: Payer: Medicare Other

## 2020-05-24 NOTE — Progress Notes (Signed)
Pharmacist Chemotherapy Monitoring - Initial Assessment    Anticipated start date: 05/30/20   Regimen:  . Are orders appropriate based on the patient's diagnosis, regimen, and cycle? Yes . Does the plan date match the patient's scheduled date? Yes . Is the sequencing of drugs appropriate? Yes . Are the premedications appropriate for the patient's regimen? Yes . Prior Authorization for treatment is: Pending o If applicable, is the correct biosimilar selected based on the patient's insurance? not applicable  Organ Function and Labs: Marland Kitchen Are dose adjustments needed based on the patient's renal function, hepatic function, or hematologic function? No . Are appropriate labs ordered prior to the start of patient's treatment? Yes . Other organ system assessment, if indicated: N/A . The following baseline labs, if indicated, have been ordered: pembrolizumab: baseline TSH +/- T4  Dose Assessment: . Are the drug doses appropriate? Yes . Are the following correct: o Drug concentrations Yes o IV fluid compatible with drug Yes o Administration routes Yes o Timing of therapy Yes . If applicable, does the patient have documented access for treatment and/or plans for port-a-cath placement? not applicable . If applicable, have lifetime cumulative doses been properly documented and assessed? not applicable Lifetime Dose Tracking  No doses have been documented on this patient for the following tracked chemicals: Doxorubicin, Epirubicin, Idarubicin, Daunorubicin, Mitoxantrone, Bleomycin, Oxaliplatin, Carboplatin, Liposomal Doxorubicin  o   Toxicity Monitoring/Prevention: . The patient has the following take home antiemetics prescribed: Dexamethasone . The patient has the following take home medications prescribed: N/A . Medication allergies and previous infusion related reactions, if applicable, have been reviewed and addressed. Yes . The patient's current medication list has been assessed for drug-drug  interactions with their chemotherapy regimen. no significant drug-drug interactions were identified on review.  Order Review: . Are the treatment plan orders signed? No . Is the patient scheduled to see a provider prior to their treatment? Yes  I verify that I have reviewed each item in the above checklist and answered each question accordingly.  Moishy Laday K 05/24/2020 4:24 PM

## 2020-05-25 ENCOUNTER — Ambulatory Visit: Payer: Medicare Other

## 2020-05-26 ENCOUNTER — Ambulatory Visit: Payer: Medicare Other

## 2020-05-29 ENCOUNTER — Ambulatory Visit: Payer: Medicare Other

## 2020-05-29 NOTE — Progress Notes (Deleted)
[facility] OFFICE PROGRESS NOTE  [name], [address] [place] [address]  DIAGNOSIS: Stage IIIA (T1b, N2, M0) non-small cell lung cancer, adenocarcinoma presented with right upper lobe lung nodule as well as right upper paratracheal adenopathy and suspicious tiny nodule in the right middle lobe.  PRIOR THERAPY: None  CURRENT THERAPY: Neoadjuvant systemic chemotherapy with carboplatin for AUC of 5, Alimta 500 mg/M2 and Keytruda 200 mg IV every 3 weeks.  First dose 05/30/2020.   INTERVAL HISTORY: [name] 69 y.o. female returns to the clinic for a follow up visit. The patient is feeling well today without any concerning complaints. The patient is scheduled to receive her first cycle of neoadjuvant treatment today.  Denies any fever, chills, night sweats, or weight loss. Denies any chest pain, shortness of breath, cough, or hemoptysis. Denies any nausea, vomiting, diarrhea, or constipation. Denies any headache or visual changes. Her and her husband are working on smoking cessation.  The patient is here today for evaluation prior to starting cycle # 1   MEDICAL HISTORY: Past Medical History:  Diagnosis Date  . Cataract   . COPD (chronic obstructive pulmonary disease) (HCC)     ALLERGIES:  has No Known Allergies.  MEDICATIONS:  Current Outpatient Medications  Medication Sig Dispense Refill  . albuterol (PROVENTIL HFA;VENTOLIN HFA) 108 (90 Base) MCG/ACT inhaler Inhale 2 puffs into the lungs every 4 (four) hours as needed for wheezing or shortness of breath (cough, shortness of breath or wheezing.). 1 Inhaler 1  . Calcium Carbonate (CALCIUM 600 PO) Take 600 mg by mouth 2 (two) times daily.     . Cholecalciferol (VITAMIN D3 PO) Take 2,000 Units by mouth daily.    . fluticasone (FLONASE) 50 MCG/ACT nasal spray Place 2 sprays into both nostrils daily. 16 g 12  . Fluticasone-Salmeterol (ADVAIR) 250-50 MCG/DOSE AEPB Inhale 1 puff into the lungs  2 (two) times daily.     . folic acid (FOLVITE) 1 MG tablet Take 1 tablet (1 mg total) by mouth daily. 30 tablet 2  . Multiple Vitamin (MULTIVITAMIN) tablet Take 1 tablet by mouth daily.    . prednisoLONE acetate (PRED FORTE) 1 % ophthalmic suspension Place 1 drop into the left eye in the morning, at noon, and at bedtime.    . prochlorperazine (COMPAZINE) 10 MG tablet Take 1 tablet (10 mg total) by mouth every 6 (six) hours as needed. 30 tablet 2   No current facility-administered medications for this visit.    SURGICAL HISTORY:  Past Surgical History:  Procedure Laterality Date  . APPENDECTOMY  2008  . COLON SURGERY  2008   colostomy-infection post op appendectomy  . COLOSTOMY REVERSAL  2009  . VIDEO BRONCHOSCOPY WITH ENDOBRONCHIAL NAVIGATION N/A 05/08/2020   Procedure: VIDEO BRONCHOSCOPY WITH ENDOBRONCHIAL NAVIGATION;  Surgeon: [name], MD;  Location: [facility];  Service: Thoracic;  Laterality: N/A;  . VIDEO BRONCHOSCOPY WITH ENDOBRONCHIAL ULTRASOUND N/A 05/08/2020   Procedure: VIDEO BRONCHOSCOPY WITH ENDOBRONCHIAL ULTRASOUND;  Surgeon: [name], MD;  Location: [facility];  Service: Thoracic;  Laterality: N/A;    REVIEW OF SYSTEMS:   Review of Systems  Constitutional: Negative for appetite change, chills, fatigue, fever and unexpected weight change.  HENT:   Negative for mouth sores, nosebleeds, sore throat and trouble swallowing.   Eyes: Negative for eye problems and icterus.  Respiratory: Negative for cough, hemoptysis, shortness of breath and wheezing.   Cardiovascular: Negative for chest pain and leg  swelling.  Gastrointestinal: Negative for abdominal pain, constipation, diarrhea, nausea and vomiting.  Genitourinary: Negative for bladder incontinence, difficulty urinating, dysuria, frequency and hematuria.   Musculoskeletal: Negative for back pain, gait problem, neck pain and neck stiffness.  Skin: Negative for itching and rash.  Neurological: Negative for  dizziness, extremity weakness, gait problem, headaches, light-headedness and seizures.  Hematological: Negative for adenopathy. Does not bruise/bleed easily.  Psychiatric/Behavioral: Negative for confusion, depression and sleep disturbance. The patient is not nervous/anxious.     PHYSICAL EXAMINATION:  There were no vitals taken for this visit.  ECOG PERFORMANCE STATUS: {CHL ONC ECOG [id number]  Physical Exam  Constitutional: Oriented to person, place, and time and well-developed, well-nourished, and in no distress. No distress.  HENT:  Head: Normocephalic and atraumatic.  Mouth/Throat: Oropharynx is clear and moist. No oropharyngeal exudate.  Eyes: Conjunctivae are normal. Right eye exhibits no discharge. Left eye exhibits no discharge. No scleral icterus.  Neck: Normal range of motion. Neck supple.  Cardiovascular: Normal rate, regular rhythm, normal heart sounds and intact distal pulses.   Pulmonary/Chest: Effort normal and breath sounds normal. No respiratory distress. No wheezes. No rales.  Abdominal: Soft. Bowel sounds are normal. Exhibits no distension and no mass. There is no tenderness.  Musculoskeletal: Normal range of motion. Exhibits no edema.  Lymphadenopathy:    No cervical adenopathy.  Neurological: Alert and oriented to person, place, and time. Exhibits normal muscle tone. Gait normal. Coordination normal.  Skin: Skin is warm and dry. No rash noted. Not diaphoretic. No erythema. No pallor.  Psychiatric: Mood, memory and judgment normal.  Vitals reviewed.  LABORATORY DATA: Lab Results  Component Value Date   WBC 7.1 05/23/2020   HGB 13.0 05/23/2020   HCT 40.1 05/23/2020   MCV 97.8 05/23/2020   PLT 243 05/23/2020      Chemistry      Component Value Date/Time   NA 141 05/23/2020 0956   NA 135 10/29/2017 1131   K 4.7 05/23/2020 0956   CL 106 05/23/2020 0956   CO2 29 05/23/2020 0956   BUN 13 05/23/2020 0956   BUN 5 (L) 10/29/2017 1131   CREATININE 0.66  05/23/2020 0956      Component Value Date/Time   CALCIUM 9.5 05/23/2020 0956   ALKPHOS 81 05/23/2020 0956   AST 17 05/23/2020 0956   ALT 18 05/23/2020 0956   BILITOT 0.5 05/23/2020 0956       RADIOGRAPHIC STUDIES:  DG Chest 2 View  Result Date: 05/05/2020 CLINICAL DATA:  69 year old female with history of pulmonary nodule. Preprocedural study prior to endobronchial navigation. EXAM: CHEST - 2 VIEW COMPARISON:  Chest x-ray 10/29/2017. FINDINGS: Lung volumes are normal. No consolidative airspace disease. No pleural effusions. No pneumothorax. No pulmonary nodule or mass noted. Pulmonary vasculature and the cardiomediastinal silhouette are within normal limits. Atherosclerosis in the thoracic aorta. IMPRESSION: 1.  No radiographic evidence of acute cardiopulmonary disease. 2. Aortic atherosclerosis. Electronically Signed   By: Vinnie Langton M.D.   On: 05/05/2020 16:00   MR Brain W Wo Contrast  Result Date: 05/18/2020 CLINICAL DATA:  Staging of non-small cell lung cancer EXAM: MRI HEAD WITHOUT AND WITH CONTRAST TECHNIQUE: Multiplanar, multiecho pulse sequences of the brain and surrounding structures were obtained without and with intravenous contrast. CONTRAST:  6mL GADAVIST GADOBUTROL 1 MMOL/ML IV SOLN COMPARISON:  None. FINDINGS: Brain: No acute infarct, acute hemorrhage or extra-axial collection. Multifocal hyperintense T2-weighted signal within the white matter. Normal volume of CSF spaces. No chronic microhemorrhage. Normal  midline structures. There is no abnormal contrast enhancement. Vascular: Normal flow voids. Skull and upper cervical spine: Normal marrow signal. Sinuses/Orbits: Negative. Other: None. IMPRESSION: 1. No intracranial metastatic disease. 2. Chronic microvascular ischemia. Electronically Signed   By: Ulyses Jarred M.D.   On: 05/18/2020 20:15   DG C-ARM BRONCHOSCOPY  Result Date: 05/08/2020 C-ARM BRONCHOSCOPY: Fluoroscopy was utilized by the requesting physician.  No  radiographic interpretation.     ASSESSMENT/PLAN:      No orders of the defined types were placed in this encounter.    Jahnia Hewes L Osceola Holian, PA-C 05/29/20

## 2020-05-30 ENCOUNTER — Inpatient Hospital Stay: Payer: Medicare Other

## 2020-05-30 ENCOUNTER — Ambulatory Visit: Payer: Medicare Other

## 2020-05-30 ENCOUNTER — Inpatient Hospital Stay: Payer: Medicare Other | Admitting: Physician Assistant

## 2020-05-31 ENCOUNTER — Ambulatory Visit: Payer: Medicare Other

## 2020-05-31 ENCOUNTER — Encounter: Payer: Self-pay | Admitting: *Deleted

## 2020-05-31 NOTE — Progress Notes (Signed)
Patient has been approved for drug replacement program by Ge[employer] for Alimta. The enrollment period is from 05/29/20 to 05/29/21. First DOS covered is 06/08/20 **Drug Replacement

## 2020-06-01 ENCOUNTER — Ambulatory Visit: Payer: Medicare Other

## 2020-06-02 ENCOUNTER — Ambulatory Visit: Payer: Medicare Other

## 2020-06-02 NOTE — Progress Notes (Signed)
Patient assistance for Beryle Flock from [employer] was denied 06/02/20 due to review of coverage, may be covered under Medicare part B supplement plan, requires prior authorization on plan.

## 2020-06-05 ENCOUNTER — Ambulatory Visit: Payer: Medicare Other

## 2020-06-06 ENCOUNTER — Ambulatory Visit: Payer: Medicare Other

## 2020-06-06 NOTE — Progress Notes (Signed)
They following Medication: [name] has been approved thru [employer] Patient Assistance as Assistance Program. Enrollment period is 06/05/20 to 09/15/20. Assistance ID: [id number], medication is ordered prior to appointment to be on hand for treatment.  First DOS: 06/08/20

## 2020-06-06 NOTE — Progress Notes (Signed)
[facility] OFFICE PROGRESS NOTE  [name], [address] [place] [id number]  DIAGNOSIS: Stage IIIA (T1b, N2, M0) non-small cell lung cancer, adenocarcinoma presented with right upper lobe lung nodule as well as right upper paratracheal adenopathy and suspicious tiny nodule in the right middle lobe.  PRIOR THERAPY: None  CURRENT THERAPY: Neoadjuvant systemic chemotherapy with carboplatin for AUC of 5, Alimta 500 mg/M2 and Keytruda 200 mg IV every 3 weeks.  First dose 06/08/2020.  INTERVAL HISTORY: [name] 69 y.o. female returns to the clinic today for a follow-up visit.  The patient is feeling well today without any concerning complaints.  She was recently diagnosed with stage IIIa non-small cell lung cancer, adenocarcinoma.  The plan is to proceed with neoadjuvant treatment and then refer her back to cardiothoracic surgery for consideration of resection.  The patient is here today to start her first cycle of treatment.  Her first day of treatment was delayed due to obtaining insurance authorization for her immunotherapy with Keytruda.  The patient denies any concerning complaints today.  She denies any recent fever, chills, night sweats, or weight loss.  She denies any chest pain, shortness of breath, cough, or hemoptysis.  She denies any nausea, vomiting, diarrhea, or constipation.  She denies any headache or visual changes besides her baseline visual changes from her cataracts.  She is here today for evaluation before starting her first cycle of treatment.  MEDICAL HISTORY: Past Medical History:  Diagnosis Date  . Cataract   . COPD (chronic obstructive pulmonary disease) (HCC)     ALLERGIES:  has No Known Allergies.  MEDICATIONS:  Current Outpatient Medications  Medication Sig Dispense Refill  . albuterol (PROVENTIL HFA;VENTOLIN HFA) 108 (90 Base) MCG/ACT inhaler Inhale 2 puffs into the lungs every 4 (four) hours as needed for wheezing  or shortness of breath (cough, shortness of breath or wheezing.). 1 Inhaler 1  . Calcium Carbonate (CALCIUM 600 PO) Take 600 mg by mouth 2 (two) times daily.     . Cholecalciferol (VITAMIN D3 PO) Take 2,000 Units by mouth daily.    . fluticasone (FLONASE) 50 MCG/ACT nasal spray Place 2 sprays into both nostrils daily. 16 g 12  . Fluticasone-Salmeterol (ADVAIR) 250-50 MCG/DOSE AEPB Inhale 1 puff into the lungs 2 (two) times daily.     . folic acid (FOLVITE) 1 MG tablet Take 1 tablet (1 mg total) by mouth daily. 30 tablet 2  . Multiple Vitamin (MULTIVITAMIN) tablet Take 1 tablet by mouth daily.    . prednisoLONE acetate (PRED FORTE) 1 % ophthalmic suspension Place 1 drop into the left eye in the morning, at noon, and at bedtime.    . prochlorperazine (COMPAZINE) 10 MG tablet Take 1 tablet (10 mg total) by mouth every 6 (six) hours as needed. 30 tablet 2   No current facility-administered medications for this visit.    SURGICAL HISTORY:  Past Surgical History:  Procedure Laterality Date  . APPENDECTOMY  2008  . COLON SURGERY  2008   colostomy-infection post op appendectomy  . COLOSTOMY REVERSAL  2009  . VIDEO BRONCHOSCOPY WITH ENDOBRONCHIAL NAVIGATION N/A 05/08/2020   Procedure: VIDEO BRONCHOSCOPY WITH ENDOBRONCHIAL NAVIGATION;  Surgeon: [name], MD;  Location: [facility];  Service: Thoracic;  Laterality: N/A;  . VIDEO BRONCHOSCOPY WITH ENDOBRONCHIAL ULTRASOUND N/A 05/08/2020   Procedure: VIDEO BRONCHOSCOPY WITH ENDOBRONCHIAL ULTRASOUND;  Surgeon: [name], MD;  Location: [facility];  Service: Thoracic;  Laterality: N/A;  REVIEW OF SYSTEMS:   Review of Systems  Constitutional: Negative for appetite change, chills, fatigue, fever and unexpected weight change.  HENT: Negative for mouth sores, nosebleeds, sore throat and trouble swallowing.   Eyes: Negative for eye problems and icterus.  Respiratory: Negative for cough, hemoptysis, shortness of breath and wheezing.    Cardiovascular: Negative for chest pain and leg swelling.  Gastrointestinal: Negative for abdominal pain, constipation, diarrhea, nausea and vomiting.  Genitourinary: Negative for bladder incontinence, difficulty urinating, dysuria, frequency and hematuria.   Musculoskeletal: Negative for back pain, gait problem, neck pain and neck stiffness.  Skin: Negative for itching and rash.  Neurological: Negative for dizziness, extremity weakness, gait problem, headaches, light-headedness and seizures.  Hematological: Negative for adenopathy. Does not bruise/bleed easily.  Psychiatric/Behavioral: Negative for confusion, depression and sleep disturbance. The patient is not nervous/anxious.     PHYSICAL EXAMINATION:  Blood pressure 125/60, pulse 66, temperature (!) 96.9 F (36.1 C), temperature source Tympanic, resp. rate 17, height 5' 2.5" (1.588 m), weight 127 lb 14.4 oz (58 kg), SpO2 99 %.  ECOG PERFORMANCE STATUS: 1 - Symptomatic but completely ambulatory  Physical Exam  Constitutional: Oriented to person, place, and time and well-developed, well-nourished, and in no distress.  HENT:  Head: Normocephalic and atraumatic.  Mouth/Throat: Oropharynx is clear and moist. No oropharyngeal exudate.  Eyes: Conjunctivae are normal. Right eye exhibits no discharge. Left eye exhibits no discharge. No scleral icterus.  Neck: Normal range of motion. Neck supple.  Cardiovascular: Normal rate, regular rhythm, normal heart sounds and intact distal pulses.   Pulmonary/Chest: Effort normal and breath sounds normal. No respiratory distress. No wheezes. No rales.  Abdominal: Soft. Bowel sounds are normal. Exhibits no distension and no mass. There is no tenderness.  Musculoskeletal: Normal range of motion. Exhibits no edema.  Lymphadenopathy:    No cervical adenopathy.  Neurological: Alert and oriented to person, place, and time. Exhibits normal muscle tone. Gait normal. Coordination normal.  Skin: Skin is warm  and dry. No rash noted. Not diaphoretic. No erythema. No pallor.  Psychiatric: Mood, memory and judgment normal.  Vitals reviewed.  LABORATORY DATA: Lab Results  Component Value Date   WBC 7.4 06/08/2020   HGB 12.9 06/08/2020   HCT 38.9 06/08/2020   MCV 96.8 06/08/2020   PLT 195 06/08/2020      Chemistry      Component Value Date/Time   NA 140 06/08/2020 0755   NA 135 10/29/2017 1131   K 3.6 06/08/2020 0755   CL 106 06/08/2020 0755   CO2 29 06/08/2020 0755   BUN 11 06/08/2020 0755   BUN 5 (L) 10/29/2017 1131   CREATININE 0.60 06/08/2020 0755      Component Value Date/Time   CALCIUM 9.6 06/08/2020 0755   ALKPHOS 79 06/08/2020 0755   AST 17 06/08/2020 0755   ALT 16 06/08/2020 0755   BILITOT 0.6 06/08/2020 0755       RADIOGRAPHIC STUDIES:  MR Brain W Wo Contrast  Result Date: 05/18/2020 CLINICAL DATA:  Staging of non-small cell lung cancer EXAM: MRI HEAD WITHOUT AND WITH CONTRAST TECHNIQUE: Multiplanar, multiecho pulse sequences of the brain and surrounding structures were obtained without and with intravenous contrast. CONTRAST:  80mL GADAVIST GADOBUTROL 1 MMOL/ML IV SOLN COMPARISON:  None. FINDINGS: Brain: No acute infarct, acute hemorrhage or extra-axial collection. Multifocal hyperintense T2-weighted signal within the white matter. Normal volume of CSF spaces. No chronic microhemorrhage. Normal midline structures. There is no abnormal contrast enhancement. Vascular: Normal flow voids.  Skull and upper cervical spine: Normal marrow signal. Sinuses/Orbits: Negative. Other: None. IMPRESSION: 1. No intracranial metastatic disease. 2. Chronic microvascular ischemia. Electronically Signed   By: Ulyses Jarred M.D.   On: 05/18/2020 20:15     ASSESSMENT/PLAN:  This is a very pleasant 69 year old Caucasian female recently diagnosed with stage IIIa (T1b, N2, M0) non-small cell lung cancer, adenocarcinoma.  She presented with a right upper lobe lung nodule in addition to mediastinal  lymphadenopathy.  The plan is to proceed with neoadjuvant treatment with carboplatin for an AUC of 5, Alimta 500 mg per metered squared, and Keytruda 200 mg IV every 3 weeks for 3 cycles.  The first dose of her treatment is scheduled to be given today.  Labs were reviewed.  Recommend that she proceed with cycle #1 today scheduled.  We will see her back for follow-up visit in 1 week for evaluation and to manage any adverse side effects of treatment.  The patient was advised to call immediately if she has any concerning symptoms in the interval. The patient voices understanding of current disease status and treatment options and is in agreement with the current care plan. All questions were answered. The patient knows to call the clinic with any problems, questions or concerns. We can certainly see the patient much sooner if necessary     No orders of the defined types were placed in this encounter.    Jonesha Tsuchiya L Shalon Councilman, PA-C 06/08/20

## 2020-06-07 ENCOUNTER — Ambulatory Visit: Payer: Medicare Other

## 2020-06-08 ENCOUNTER — Ambulatory Visit: Payer: Medicare Other

## 2020-06-08 ENCOUNTER — Inpatient Hospital Stay: Payer: Medicare Other

## 2020-06-08 ENCOUNTER — Inpatient Hospital Stay: Payer: Medicare Other | Admitting: Physician Assistant

## 2020-06-08 ENCOUNTER — Other Ambulatory Visit: Payer: Self-pay

## 2020-06-08 ENCOUNTER — Encounter: Payer: Self-pay | Admitting: Physician Assistant

## 2020-06-08 VITALS — BP 125/60 | HR 66 | Temp 96.9°F | Resp 17 | Ht 62.5 in | Wt 127.9 lb

## 2020-06-08 DIAGNOSIS — C3411 Malignant neoplasm of upper lobe, right bronchus or lung: Secondary | ICD-10-CM

## 2020-06-08 DIAGNOSIS — Z5112 Encounter for antineoplastic immunotherapy: Secondary | ICD-10-CM

## 2020-06-08 DIAGNOSIS — Z5111 Encounter for antineoplastic chemotherapy: Secondary | ICD-10-CM

## 2020-06-08 LAB — CBC WITH DIFFERENTIAL (CANCER CENTER ONLY)
Abs Immature Granulocytes: 0.05 10*3/uL (ref 0.00–0.07)
Basophils Absolute: 0.1 10*3/uL (ref 0.0–0.1)
Basophils Relative: 1 %
Eosinophils Absolute: 0.1 10*3/uL (ref 0.0–0.5)
Eosinophils Relative: 2 %
HCT: 38.9 % (ref 36.0–46.0)
Hemoglobin: 12.9 g/dL (ref 12.0–15.0)
Immature Granulocytes: 1 %
Lymphocytes Relative: 27 %
Lymphs Abs: 2 10*3/uL (ref 0.7–4.0)
MCH: 32.1 pg (ref 26.0–34.0)
MCHC: 33.2 g/dL (ref 30.0–36.0)
MCV: 96.8 fL (ref 80.0–100.0)
Monocytes Absolute: 0.6 10*3/uL (ref 0.1–1.0)
Monocytes Relative: 8 %
Neutro Abs: 4.6 10*3/uL (ref 1.7–7.7)
Neutrophils Relative %: 61 %
Platelet Count: 195 10*3/uL (ref 150–400)
RBC: 4.02 MIL/uL (ref 3.87–5.11)
RDW: 12 % (ref 11.5–15.5)
WBC Count: 7.4 10*3/uL (ref 4.0–10.5)
nRBC: 0 % (ref 0.0–0.2)

## 2020-06-08 LAB — CMP (CANCER CENTER ONLY)
ALT: 16 U/L (ref 0–44)
AST: 17 U/L (ref 15–41)
Albumin: 3.7 g/dL (ref 3.5–5.0)
Alkaline Phosphatase: 79 U/L (ref 38–126)
Anion gap: 5 (ref 5–15)
BUN: 11 mg/dL (ref 8–23)
CO2: 29 mmol/L (ref 22–32)
Calcium: 9.6 mg/dL (ref 8.9–10.3)
Chloride: 106 mmol/L (ref 98–111)
Creatinine: 0.6 mg/dL (ref 0.44–1.00)
GFR, Est AFR Am: >60 mL/min (ref >60)
GFR, Estimated: >60 mL/min (ref >60)
Glucose, Bld: 85 mg/dL (ref 70–99)
Potassium: 3.6 mmol/L (ref 3.5–5.1)
Sodium: 140 mmol/L (ref 135–145)
Total Bilirubin: 0.6 mg/dL (ref 0.3–1.2)
Total Protein: 6.6 g/dL (ref 6.5–8.1)

## 2020-06-08 LAB — TSH: TSH: 2.721 u[IU]/mL (ref 0.308–3.960)

## 2020-06-08 MED ORDER — PALONOSETRON HCL INJECTION 0.25 MG/5ML
0.2500 mg | Freq: Once | INTRAVENOUS | Status: AC
  Administered 2020-06-08: 0.25 mg via INTRAVENOUS

## 2020-06-08 MED ORDER — SODIUM CHLORIDE 0.9 % IV SOLN
355.0000 mg | Freq: Once | INTRAVENOUS | Status: AC
  Administered 2020-06-08: 360 mg via INTRAVENOUS
  Filled 2020-06-08: qty 36

## 2020-06-08 MED ORDER — SODIUM CHLORIDE 0.9 % IV SOLN
10.0000 mg | Freq: Once | INTRAVENOUS | Status: AC
  Administered 2020-06-08: 10 mg via INTRAVENOUS
  Filled 2020-06-08: qty 10

## 2020-06-08 MED ORDER — PALONOSETRON HCL INJECTION 0.25 MG/5ML
INTRAVENOUS | Status: AC
  Filled 2020-06-08: qty 5

## 2020-06-08 MED ORDER — SODIUM CHLORIDE 0.9 % IV SOLN
500.0000 mg/m2 | Freq: Once | INTRAVENOUS | Status: AC
  Administered 2020-06-08: 800 mg via INTRAVENOUS
  Filled 2020-06-08: qty 20

## 2020-06-08 MED ORDER — SODIUM CHLORIDE 0.9 % IV SOLN
Freq: Once | INTRAVENOUS | Status: AC
  Filled 2020-06-08: qty 250

## 2020-06-08 MED ORDER — SODIUM CHLORIDE 0.9 % IV SOLN
200.0000 mg | Freq: Once | INTRAVENOUS | Status: AC
  Administered 2020-06-08: 200 mg via INTRAVENOUS
  Filled 2020-06-08: qty 8

## 2020-06-08 MED ORDER — SODIUM CHLORIDE 0.9 % IV SOLN
150.0000 mg | Freq: Once | INTRAVENOUS | Status: AC
  Administered 2020-06-08: 150 mg via INTRAVENOUS
  Filled 2020-06-08: qty 150

## 2020-06-08 NOTE — Patient Instructions (Addendum)
[facility] Discharge Instructions for Patients Receiving Chemotherapy  Today you received the following chemotherapy agents: pembrolizumab/pemetrexed/carboplatin.  To help prevent nausea and vomiting after your treatment, we encourage you to take your nausea medication as directed.   If you develop nausea and vomiting that is not controlled by your nausea medication, call the clinic.   BELOW ARE SYMPTOMS THAT SHOULD BE REPORTED IMMEDIATELY:  *FEVER GREATER THAN 100.5 F  *CHILLS WITH OR WITHOUT FEVER  NAUSEA AND VOMITING THAT IS NOT CONTROLLED WITH YOUR NAUSEA MEDICATION  *UNUSUAL SHORTNESS OF BREATH  *UNUSUAL BRUISING OR BLEEDING  TENDERNESS IN MOUTH AND THROAT WITH OR WITHOUT PRESENCE OF ULCERS  *URINARY PROBLEMS  *BOWEL PROBLEMS  UNUSUAL RASH Items with * indicate a potential emergency and should be followed up as soon as possible.  Feel free to call the clinic should you have any questions or concerns. The clinic phone number is [phone].  Please show the [facility] at check-in to the Emergency Department and triage nurse.  Pembrolizumab injection What is this medicine? PEMBROLIZUMAB (pem broe liz ue mab) is a monoclonal antibody. It is used to treat certain types of cancer. This medicine may be used for other purposes; ask your health care provider or pharmacist if you have questions. COMMON BRAND NAME(S): Keytruda What should I tell my health care provider before I take this medicine? They need to know if you have any of these conditions:  diabetes  immune system problems  inflammatory bowel disease  liver disease  lung or breathing disease  lupus  received or scheduled to receive an organ transplant or a stem-cell transplant that uses donor stem cells  an unusual or allergic reaction to pembrolizumab, other medicines, foods, dyes, or preservatives  pregnant or trying to get pregnant  breast-feeding How should I use this  medicine? This medicine is for infusion into a vein. It is given by a health care professional in a hospital or clinic setting. A special MedGuide will be given to you before each treatment. Be sure to read this information carefully each time. Talk to your pediatrician regarding the use of this medicine in children. While this drug may be prescribed for children as young as 6 months for selected conditions, precautions do apply. Overdosage: If you think you have taken too much of this medicine contact a poison control center or emergency room at once. NOTE: This medicine is only for you. Do not share this medicine with others. What if I miss a dose? It is important not to miss your dose. Call your doctor or health care professional if you are unable to keep an appointment. What may interact with this medicine? Interactions have not been studied. Give your health care provider a list of all the medicines, herbs, non-prescription drugs, or dietary supplements you use. Also tell them if you smoke, drink alcohol, or use illegal drugs. Some items may interact with your medicine. This list may not describe all possible interactions. Give your health care provider a list of all the medicines, herbs, non-prescription drugs, or dietary supplements you use. Also tell them if you smoke, drink alcohol, or use illegal drugs. Some items may interact with your medicine. What should I watch for while using this medicine? Your condition will be monitored carefully while you are receiving this medicine. You may need blood work done while you are taking this medicine. Do not become pregnant while taking this medicine or for 4 months after stopping it. Women should  inform their doctor if they wish to become pregnant or think they might be pregnant. There is a potential for serious side effects to an unborn child. Talk to your health care professional or pharmacist for more information. Do not breast-feed an infant while  taking this medicine or for 4 months after the last dose. What side effects may I notice from receiving this medicine? Side effects that you should report to your doctor or health care professional as soon as possible:  allergic reactions like skin rash, itching or hives, swelling of the face, lips, or tongue  bloody or black, tarry  breathing problems  changes in vision  chest pain  chills  confusion  constipation  cough  diarrhea  dizziness or feeling faint or lightheaded  fast or irregular heartbeat  fever  flushing  joint pain  low blood counts - this medicine may decrease the number of white blood cells, red blood cells and platelets. You may be at increased risk for infections and bleeding.  muscle pain  muscle weakness  pain, tingling, numbness in the hands or feet  persistent headache  redness, blistering, peeling or loosening of the skin, including inside the mouth  signs and symptoms of high blood sugar such as dizziness; dry mouth; dry skin; fruity breath; nausea; stomach pain; increased hunger or thirst; increased urination  signs and symptoms of kidney injury like trouble passing urine or change in the amount of urine  signs and symptoms of liver injury like dark urine, light-colored stools, loss of appetite, nausea, right upper belly pain, yellowing of the eyes or skin  sweating  swollen lymph nodes  weight loss Side effects that usually do not require medical attention (report to your doctor or health care professional if they continue or are bothersome):  decreased appetite  hair loss  muscle pain  tiredness This list may not describe all possible side effects. Call your doctor for medical advice about side effects. You may report side effects to FDA at 1-800-FDA-1088. Where should I keep my medicine? This drug is given in a hospital or clinic and will not be stored at home. NOTE: This sheet is a summary. It may not cover all  possible information. If you have questions about this medicine, talk to your doctor, pharmacist, or health care provider.  2020 Elsevier/Gold Standard (2019-07-09 18:07:58)  Pemetrexed injection What is this medicine? PEMETREXED (PEM e TREX ed) is a chemotherapy drug used to treat lung cancers like non-small cell lung cancer and mesothelioma. It may also be used to treat other cancers. This medicine may be used for other purposes; ask your health care provider or pharmacist if you have questions. COMMON BRAND NAME(S): Alimta What should I tell my health care provider before I take this medicine? They need to know if you have any of these conditions:  infection (especially a virus infection such as chickenpox, cold sores, or herpes)  kidney disease  low blood counts, like low white cell, platelet, or red cell counts  lung or breathing disease, like asthma  radiation therapy  an unusual or allergic reaction to pemetrexed, other medicines, foods, dyes, or preservative  pregnant or trying to get pregnant  breast-feeding How should I use this medicine? This drug is given as an infusion into a vein. It is administered in a hospital or clinic by a specially trained health care professional. Talk to your pediatrician regarding the use of this medicine in children. Special care may be needed. Overdosage: If you think  you have taken too much of this medicine contact a poison control center or emergency room at once. NOTE: This medicine is only for you. Do not share this medicine with others. What if I miss a dose? It is important not to miss your dose. Call your doctor or health care professional if you are unable to keep an appointment. What may interact with this medicine? This medicine may interact with the following medications:  Ibuprofen This list may not describe all possible interactions. Give your health care provider a list of all the medicines, herbs, non-prescription drugs,  or dietary supplements you use. Also tell them if you smoke, drink alcohol, or use illegal drugs. Some items may interact with your medicine. What should I watch for while using this medicine? Visit your doctor for checks on your progress. This drug may make you feel generally unwell. This is not uncommon, as chemotherapy can affect healthy cells as well as cancer cells. Report any side effects. Continue your course of treatment even though you feel ill unless your doctor tells you to stop. In some cases, you may be given additional medicines to help with side effects. Follow all directions for their use. Call your doctor or health care professional for advice if you get a fever, chills or sore throat, or other symptoms of a cold or flu. Do not treat yourself. This drug decreases your body's ability to fight infections. Try to avoid being around people who are sick. This medicine may increase your risk to bruise or bleed. Call your doctor or health care professional if you notice any unusual bleeding. Be careful brushing and flossing your teeth or using a toothpick because you may get an infection or bleed more easily. If you have any dental work done, tell your dentist you are receiving this medicine. Avoid taking products that contain aspirin, acetaminophen, ibuprofen, naproxen, or ketoprofen unless instructed by your doctor. These medicines may hide a fever. Call your doctor or health care professional if you get diarrhea or mouth sores. Do not treat yourself. To protect your kidneys, drink water or other fluids as directed while you are taking this medicine. Do not become pregnant while taking this medicine or for 6 months after stopping it. Women should inform their doctor if they wish to become pregnant or think they might be pregnant. Men should not father a child while taking this medicine and for 3 months after stopping it. This may interfere with the ability to father a child. You should talk to  your doctor or health care professional if you are concerned about your fertility. There is a potential for serious side effects to an unborn child. Talk to your health care professional or pharmacist for more information. Do not breast-feed an infant while taking this medicine or for 1 week after stopping it. What side effects may I notice from receiving this medicine? Side effects that you should report to your doctor or health care professional as soon as possible:  allergic reactions like skin rash, itching or hives, swelling of the face, lips, or tongue  breathing problems  redness, blistering, peeling or loosening of the skin, including inside the mouth  signs and symptoms of bleeding such as bloody or black, tarry stools; red or dark-brown urine; spitting up blood or brown material that looks like coffee grounds; red spots on the skin; unusual bruising or bleeding from the eye, gums, or nose  signs and symptoms of infection like fever or chills; cough; sore throat;  pain or trouble passing urine  signs and symptoms of kidney injury like trouble passing urine or change in the amount of urine  signs and symptoms of liver injury like dark yellow or brown urine; general ill feeling or flu-like symptoms; light-colored stools; loss of appetite; nausea; right upper belly pain; unusually weak or tired; yellowing of the eyes or skin Side effects that usually do not require medical attention (report to your doctor or health care professional if they continue or are bothersome):  constipation  mouth sores  nausea, vomiting  unusually weak or tired This list may not describe all possible side effects. Call your doctor for medical advice about side effects. You may report side effects to FDA at 1-800-FDA-1088. Where should I keep my medicine? This drug is given in a hospital or clinic and will not be stored at home. NOTE: This sheet is a summary. It may not cover all possible information. If  you have questions about this medicine, talk to your doctor, pharmacist, or health care provider.  2020 Elsevier/Gold Standard (2017-10-22 16:11:33)  Carboplatin injection What is this medicine? CARBOPLATIN (KAR boe pla tin) is a chemotherapy drug. It targets fast dividing cells, like cancer cells, and causes these cells to die. This medicine is used to treat ovarian cancer and many other cancers. This medicine may be used for other purposes; ask your health care provider or pharmacist if you have questions. COMMON BRAND NAME(S): Paraplatin What should I tell my health care provider before I take this medicine? They need to know if you have any of these conditions:  blood disorders  hearing problems  kidney disease  recent or ongoing radiation therapy  an unusual or allergic reaction to carboplatin, cisplatin, other chemotherapy, other medicines, foods, dyes, or preservatives  pregnant or trying to get pregnant  breast-feeding How should I use this medicine? This drug is usually given as an infusion into a vein. It is administered in a hospital or clinic by a specially trained health care professional. Talk to your pediatrician regarding the use of this medicine in children. Special care may be needed. Overdosage: If you think you have taken too much of this medicine contact a poison control center or emergency room at once. NOTE: This medicine is only for you. Do not share this medicine with others. What if I miss a dose? It is important not to miss a dose. Call your doctor or health care professional if you are unable to keep an appointment. What may interact with this medicine?  medicines for seizures  medicines to increase blood counts like filgrastim, pegfilgrastim, sargramostim  some antibiotics like amikacin, gentamicin, neomycin, streptomycin, tobramycin  vaccines Talk to your doctor or health care professional before taking any of these  medicines:  acetaminophen  aspirin  ibuprofen  ketoprofen  naproxen This list may not describe all possible interactions. Give your health care provider a list of all the medicines, herbs, non-prescription drugs, or dietary supplements you use. Also tell them if you smoke, drink alcohol, or use illegal drugs. Some items may interact with your medicine. What should I watch for while using this medicine? Your condition will be monitored carefully while you are receiving this medicine. You will need important blood work done while you are taking this medicine. This drug may make you feel generally unwell. This is not uncommon, as chemotherapy can affect healthy cells as well as cancer cells. Report any side effects. Continue your course of treatment even though you feel ill  unless your doctor tells you to stop. In some cases, you may be given additional medicines to help with side effects. Follow all directions for their use. Call your doctor or health care professional for advice if you get a fever, chills or sore throat, or other symptoms of a cold or flu. Do not treat yourself. This drug decreases your body's ability to fight infections. Try to avoid being around people who are sick. This medicine may increase your risk to bruise or bleed. Call your doctor or health care professional if you notice any unusual bleeding. Be careful brushing and flossing your teeth or using a toothpick because you may get an infection or bleed more easily. If you have any dental work done, tell your dentist you are receiving this medicine. Avoid taking products that contain aspirin, acetaminophen, ibuprofen, naproxen, or ketoprofen unless instructed by your doctor. These medicines may hide a fever. Do not become pregnant while taking this medicine. Women should inform their doctor if they wish to become pregnant or think they might be pregnant. There is a potential for serious side effects to an unborn child. Talk  to your health care professional or pharmacist for more information. Do not breast-feed an infant while taking this medicine. What side effects may I notice from receiving this medicine? Side effects that you should report to your doctor or health care professional as soon as possible:  allergic reactions like skin rash, itching or hives, swelling of the face, lips, or tongue  signs of infection - fever or chills, cough, sore throat, pain or difficulty passing urine  signs of decreased platelets or bleeding - bruising, pinpoint red spots on the skin, black, tarry stools, nosebleeds  signs of decreased red blood cells - unusually weak or tired, fainting spells, lightheadedness  breathing problems  changes in hearing  changes in vision  chest pain  high blood pressure  low blood counts - This drug may decrease the number of white blood cells, red blood cells and platelets. You may be at increased risk for infections and bleeding.  nausea and vomiting  pain, swelling, redness or irritation at the injection site  pain, tingling, numbness in the hands or feet  problems with balance, talking, walking  trouble passing urine or change in the amount of urine Side effects that usually do not require medical attention (report to your doctor or health care professional if they continue or are bothersome):  hair loss  loss of appetite  metallic taste in the mouth or changes in taste This list may not describe all possible side effects. Call your doctor for medical advice about side effects. You may report side effects to FDA at 1-800-FDA-1088. Where should I keep my medicine? This drug is given in a hospital or clinic and will not be stored at home. NOTE: This sheet is a summary. It may not cover all possible information. If you have questions about this medicine, talk to your doctor, pharmacist, or health care provider.  2020 Elsevier/Gold Standard (2007-12-08 14:38:05)

## 2020-06-09 ENCOUNTER — Ambulatory Visit: Payer: Medicare Other

## 2020-06-09 ENCOUNTER — Telehealth: Payer: Self-pay | Admitting: Physician Assistant

## 2020-06-09 NOTE — Telephone Encounter (Signed)
Scheduled per los. Called and spoke with patient. Confirmed appts  

## 2020-06-12 ENCOUNTER — Ambulatory Visit: Payer: Medicare Other

## 2020-06-13 ENCOUNTER — Ambulatory Visit: Payer: Medicare Other

## 2020-06-13 NOTE — Progress Notes (Signed)
[facility] OFFICE PROGRESS NOTE  [name], [place] [address]  [id number]  DIAGNOSIS: Stage IIIA(T1b, N2, M0) non-small cell lung cancer, adenocarcinoma presented with right upper lobe lung nodule as well as right upper paratracheal adenopathy and suspicious tiny nodule in the right middle lobe.  PRIOR THERAPY: None  CURRENT THERAPY: Neoadjuvant systemic chemotherapy with carboplatin for AUC of 5, Alimta 500 mg/M2 and Keytruda 200 mg IV every 3 weeks. First dose 06/08/2020. Status post 1 cycle.   INTERVAL HISTORY: [name] 69 y.o. female returns to the clinic today for a follow-up visit.  The patient is feeling well today without any concerning complaints. Last week, she underwent her first cycle of neoadjuvant chemotherapy and she tolerated it well without any adverse side effects except for constipation and a headache which has resolved at this time. She has stool softeners and laxatives at home if needed. She had a headache about 24 hours which has since resolved. Denies visual changes, seizures, or extremity weakness.  She denies any fever, chills, night sweats, or weight loss.  She denies any chest pain, shortness of breath, cough, or hemoptysis.  She denies any nausea, vomiting, or diarrhea. She is here today for evaluation in 1 week follow-up visit.  MEDICAL HISTORY: Past Medical History:  Diagnosis Date  . Cataract   . COPD (chronic obstructive pulmonary disease) (HCC)     ALLERGIES:  has No Known Allergies.  MEDICATIONS:  Current Outpatient Medications  Medication Sig Dispense Refill  . albuterol (PROVENTIL HFA;VENTOLIN HFA) 108 (90 Base) MCG/ACT inhaler Inhale 2 puffs into the lungs every 4 (four) hours as needed for wheezing or shortness of breath (cough, shortness of breath or wheezing.). 1 Inhaler 1  . Calcium Carbonate (CALCIUM 600 PO) Take 600 mg by mouth 2 (two) times daily.     . Cholecalciferol (VITAMIN D3 PO) Take  2,000 Units by mouth daily.    . fluticasone (FLONASE) 50 MCG/ACT nasal spray Place 2 sprays into both nostrils daily. 16 g 12  . Fluticasone-Salmeterol (ADVAIR) 250-50 MCG/DOSE AEPB Inhale 1 puff into the lungs 2 (two) times daily.     . folic acid (FOLVITE) 1 MG tablet Take 1 tablet (1 mg total) by mouth daily. 30 tablet 2  . Multiple Vitamin (MULTIVITAMIN) tablet Take 1 tablet by mouth daily.    . prednisoLONE acetate (PRED FORTE) 1 % ophthalmic suspension Place 1 drop into the left eye in the morning, at noon, and at bedtime.    . prochlorperazine (COMPAZINE) 10 MG tablet Take 1 tablet (10 mg total) by mouth every 6 (six) hours as needed. 30 tablet 2   No current facility-administered medications for this visit.    SURGICAL HISTORY:  Past Surgical History:  Procedure Laterality Date  . APPENDECTOMY  2008  . COLON SURGERY  2008   colostomy-infection post op appendectomy  . COLOSTOMY REVERSAL  2009  . VIDEO BRONCHOSCOPY WITH ENDOBRONCHIAL NAVIGATION N/A 05/08/2020   Procedure: VIDEO BRONCHOSCOPY WITH ENDOBRONCHIAL NAVIGATION;  Surgeon: [name], MD;  Location: [facility];  Service: Thoracic;  Laterality: N/A;  . VIDEO BRONCHOSCOPY WITH ENDOBRONCHIAL ULTRASOUND N/A 05/08/2020   Procedure: VIDEO BRONCHOSCOPY WITH ENDOBRONCHIAL ULTRASOUND;  Surgeon: [name], MD;  Location: [facility];  Service: Thoracic;  Laterality: N/A;    REVIEW OF SYSTEMS:   Review of Systems  Constitutional: Negative for appetite change, chills, fatigue, fever and unexpected weight change.  HENT: Negative for mouth sores, nosebleeds, sore  throat and trouble swallowing.   Eyes: Negative for eye problems and icterus.  Respiratory: Negative for cough, hemoptysis, shortness of breath and wheezing.   Cardiovascular: Negative for chest pain and leg swelling.  Gastrointestinal: Positive for constipation (resolved). Negative for abdominal pain, diarrhea, nausea and vomiting.  Genitourinary: Negative for  bladder incontinence, difficulty urinating, dysuria, frequency and hematuria.   Musculoskeletal: Negative for back pain, gait problem, neck pain and neck stiffness.  Skin: Negative for itching and rash.  Neurological: Positive for a headache following cycle #1 (resolved). Negative for dizziness, extremity weakness, gait problem, light-headedness and seizures.  Hematological: Negative for adenopathy. Does not bruise/bleed easily.  Psychiatric/Behavioral: Negative for confusion, depression and sleep disturbance. The patient is not nervous/anxious.     PHYSICAL EXAMINATION:  Blood pressure 109/60, pulse 71, temperature 98.4 F (36.9 C), temperature source Oral, resp. rate 18, height 5' 2.5" (1.588 m), weight 125 lb 6.4 oz (56.9 kg), SpO2 95 %.  ECOG PERFORMANCE STATUS: 1 - Symptomatic but completely ambulatory  Physical Exam  Constitutional: Oriented to person, place, and time and well-developed, well-nourished, and in no distress.  HENT:  Head: Normocephalic and atraumatic.  Mouth/Throat: Oropharynx is clear and moist. No oropharyngeal exudate.  Eyes: Conjunctivae are normal. Right eye exhibits no discharge. Left eye exhibits no discharge. No scleral icterus.  Neck: Normal range of motion. Neck supple.  Cardiovascular: Normal rate, regular rhythm, normal heart sounds and intact distal pulses.   Pulmonary/Chest: Effort normal and breath sounds normal. No respiratory distress. No wheezes. No rales.  Abdominal: Soft. Bowel sounds are normal. Exhibits no distension and no mass. There is no tenderness.  Musculoskeletal: Normal range of motion. Exhibits no edema.  Lymphadenopathy:    No cervical adenopathy.  Neurological: Alert and oriented to person, place, and time. Exhibits normal muscle tone. Gait normal. Coordination normal.  Skin: Skin is warm and dry. No rash noted. Not diaphoretic. No erythema. No pallor.  Psychiatric: Mood, memory and judgment normal.  Vitals reviewed.  LABORATORY  DATA: Lab Results  Component Value Date   WBC 4.6 06/15/2020   HGB 11.9 (L) 06/15/2020   HCT 36.7 06/15/2020   MCV 96.3 06/15/2020   PLT 186 06/15/2020      Chemistry      Component Value Date/Time   NA 140 06/08/2020 0755   NA 135 10/29/2017 1131   K 3.6 06/08/2020 0755   CL 106 06/08/2020 0755   CO2 29 06/08/2020 0755   BUN 11 06/08/2020 0755   BUN 5 (L) 10/29/2017 1131   CREATININE 0.60 06/08/2020 0755      Component Value Date/Time   CALCIUM 9.6 06/08/2020 0755   ALKPHOS 79 06/08/2020 0755   AST 17 06/08/2020 0755   ALT 16 06/08/2020 0755   BILITOT 0.6 06/08/2020 0755       RADIOGRAPHIC STUDIES:  MR Brain W Wo Contrast  Result Date: 05/18/2020 CLINICAL DATA:  Staging of non-small cell lung cancer EXAM: MRI HEAD WITHOUT AND WITH CONTRAST TECHNIQUE: Multiplanar, multiecho pulse sequences of the brain and surrounding structures were obtained without and with intravenous contrast. CONTRAST:  39mL GADAVIST GADOBUTROL 1 MMOL/ML IV SOLN COMPARISON:  None. FINDINGS: Brain: No acute infarct, acute hemorrhage or extra-axial collection. Multifocal hyperintense T2-weighted signal within the white matter. Normal volume of CSF spaces. No chronic microhemorrhage. Normal midline structures. There is no abnormal contrast enhancement. Vascular: Normal flow voids. Skull and upper cervical spine: Normal marrow signal. Sinuses/Orbits: Negative. Other: None. IMPRESSION: 1. No intracranial metastatic disease. 2.  Chronic microvascular ischemia. Electronically Signed   By: Ulyses Jarred M.D.   On: 05/18/2020 20:15     ASSESSMENT/PLAN:  This is a very pleasant 69 year old Caucasian female recently diagnosed with stage IIIa (T1b, N2, M0) non-small cell lung cancer, adenocarcinoma.  She presented with a right upper lobe lung nodule in addition to mediastinal lymphadenopathy. She was diagnosed in August 2021.  The plan is to proceed with neoadjuvant treatment with carboplatin for an AUC of 5, Alimta  500 mg per metered squared, and Keytruda 200 mg IV every 3 weeks for 3 cycles.  She is status post 1 cycle and tolerated it well except for a headache and constipation which has since resolved.    Labs were reviewed.  Recommend that she proceed with cycle #2 in 2 weeks as scheduled.  The patient has stool softeners and laxatives if needed for constipation if needed for future cycles of treatment.   The patient was advised to call immediately if she has any concerning symptoms in the interval. The patient voices understanding of current disease status and treatment options and is in agreement with the current care plan. All questions were answered. The patient knows to call the clinic with any problems, questions or concerns. We can certainly see the patient much sooner if necessary    No orders of the defined types were placed in this encounter.    Jawan Chavarria L Xylia Scherger, PA-C 06/15/20

## 2020-06-14 ENCOUNTER — Ambulatory Visit: Payer: Medicare Other

## 2020-06-15 ENCOUNTER — Inpatient Hospital Stay: Payer: Medicare Other

## 2020-06-15 ENCOUNTER — Inpatient Hospital Stay: Payer: Medicare Other | Admitting: Physician Assistant

## 2020-06-15 ENCOUNTER — Other Ambulatory Visit: Payer: Self-pay

## 2020-06-15 ENCOUNTER — Ambulatory Visit: Payer: Medicare Other

## 2020-06-15 VITALS — BP 109/60 | HR 71 | Temp 98.4°F | Resp 18 | Ht 62.5 in | Wt 125.4 lb

## 2020-06-15 DIAGNOSIS — C3411 Malignant neoplasm of upper lobe, right bronchus or lung: Secondary | ICD-10-CM | POA: Diagnosis not present

## 2020-06-15 LAB — CBC WITH DIFFERENTIAL (CANCER CENTER ONLY)
Abs Immature Granulocytes: 0.02 10*3/uL (ref 0.00–0.07)
Basophils Absolute: 0 10*3/uL (ref 0.0–0.1)
Basophils Relative: 0 %
Eosinophils Absolute: 0.1 10*3/uL (ref 0.0–0.5)
Eosinophils Relative: 2 %
HCT: 36.7 % (ref 36.0–46.0)
Hemoglobin: 11.9 g/dL — ABNORMAL LOW (ref 12.0–15.0)
Immature Granulocytes: 0 %
Lymphocytes Relative: 32 %
Lymphs Abs: 1.5 10*3/uL (ref 0.7–4.0)
MCH: 31.2 pg (ref 26.0–34.0)
MCHC: 32.4 g/dL (ref 30.0–36.0)
MCV: 96.3 fL (ref 80.0–100.0)
Monocytes Absolute: 0.4 10*3/uL (ref 0.1–1.0)
Monocytes Relative: 10 %
Neutro Abs: 2.6 10*3/uL (ref 1.7–7.7)
Neutrophils Relative %: 56 %
Platelet Count: 186 10*3/uL (ref 150–400)
RBC: 3.81 MIL/uL — ABNORMAL LOW (ref 3.87–5.11)
RDW: 11.6 % (ref 11.5–15.5)
WBC Count: 4.6 10*3/uL (ref 4.0–10.5)
nRBC: 0 % (ref 0.0–0.2)

## 2020-06-15 LAB — CMP (CANCER CENTER ONLY)
ALT: 30 U/L (ref 0–44)
AST: 20 U/L (ref 15–41)
Albumin: 3.7 g/dL (ref 3.5–5.0)
Alkaline Phosphatase: 87 U/L (ref 38–126)
Anion gap: 9 (ref 5–15)
BUN: 14 mg/dL (ref 8–23)
CO2: 27 mmol/L (ref 22–32)
Calcium: 9.4 mg/dL (ref 8.9–10.3)
Chloride: 104 mmol/L (ref 98–111)
Creatinine: 0.61 mg/dL (ref 0.44–1.00)
GFR, Est AFR Am: >60 mL/min (ref >60)
GFR, Estimated: >60 mL/min (ref >60)
Glucose, Bld: 93 mg/dL (ref 70–99)
Potassium: 4.6 mmol/L (ref 3.5–5.1)
Sodium: 140 mmol/L (ref 135–145)
Total Bilirubin: 0.4 mg/dL (ref 0.3–1.2)
Total Protein: 6.8 g/dL (ref 6.5–8.1)

## 2020-06-15 LAB — TSH: TSH: 1.394 u[IU]/mL (ref 0.308–3.960)

## 2020-06-16 ENCOUNTER — Ambulatory Visit: Payer: Medicare Other

## 2020-06-19 ENCOUNTER — Ambulatory Visit: Payer: Medicare Other

## 2020-06-20 ENCOUNTER — Ambulatory Visit: Payer: Medicare Other

## 2020-06-21 ENCOUNTER — Ambulatory Visit: Payer: Medicare Other

## 2020-06-22 ENCOUNTER — Inpatient Hospital Stay: Payer: Medicare Other | Attending: Physician Assistant

## 2020-06-22 ENCOUNTER — Other Ambulatory Visit: Payer: Self-pay

## 2020-06-22 ENCOUNTER — Other Ambulatory Visit: Payer: Medicare Other

## 2020-06-22 ENCOUNTER — Ambulatory Visit: Payer: Medicare Other

## 2020-06-22 DIAGNOSIS — Z5111 Encounter for antineoplastic chemotherapy: Secondary | ICD-10-CM | POA: Insufficient documentation

## 2020-06-22 DIAGNOSIS — Z79899 Other long term (current) drug therapy: Secondary | ICD-10-CM | POA: Insufficient documentation

## 2020-06-22 DIAGNOSIS — C3411 Malignant neoplasm of upper lobe, right bronchus or lung: Secondary | ICD-10-CM | POA: Diagnosis not present

## 2020-06-22 DIAGNOSIS — Z5112 Encounter for antineoplastic immunotherapy: Secondary | ICD-10-CM | POA: Diagnosis not present

## 2020-06-22 LAB — CMP (CANCER CENTER ONLY)
ALT: 24 U/L (ref 0–44)
AST: 23 U/L (ref 15–41)
Albumin: 3.7 g/dL (ref 3.5–5.0)
Alkaline Phosphatase: 84 U/L (ref 38–126)
Anion gap: 4 — ABNORMAL LOW (ref 5–15)
BUN: 9 mg/dL (ref 8–23)
CO2: 31 mmol/L (ref 22–32)
Calcium: 9.7 mg/dL (ref 8.9–10.3)
Chloride: 104 mmol/L (ref 98–111)
Creatinine: 0.63 mg/dL (ref 0.44–1.00)
GFR, Estimated: >60 mL/min (ref >60)
Glucose, Bld: 99 mg/dL (ref 70–99)
Potassium: 4.2 mmol/L (ref 3.5–5.1)
Sodium: 139 mmol/L (ref 135–145)
Total Bilirubin: 0.5 mg/dL (ref 0.3–1.2)
Total Protein: 6.8 g/dL (ref 6.5–8.1)

## 2020-06-22 LAB — CBC WITH DIFFERENTIAL (CANCER CENTER ONLY)
Abs Immature Granulocytes: 0.02 10*3/uL (ref 0.00–0.07)
Basophils Absolute: 0 10*3/uL (ref 0.0–0.1)
Basophils Relative: 0 %
Eosinophils Absolute: 0.1 10*3/uL (ref 0.0–0.5)
Eosinophils Relative: 2 %
HCT: 35.6 % — ABNORMAL LOW (ref 36.0–46.0)
Hemoglobin: 11.9 g/dL — ABNORMAL LOW (ref 12.0–15.0)
Immature Granulocytes: 0 %
Lymphocytes Relative: 24 %
Lymphs Abs: 1.3 10*3/uL (ref 0.7–4.0)
MCH: 32.2 pg (ref 26.0–34.0)
MCHC: 33.4 g/dL (ref 30.0–36.0)
MCV: 96.5 fL (ref 80.0–100.0)
Monocytes Absolute: 0.7 10*3/uL (ref 0.1–1.0)
Monocytes Relative: 12 %
Neutro Abs: 3.3 10*3/uL (ref 1.7–7.7)
Neutrophils Relative %: 62 %
Platelet Count: 200 10*3/uL (ref 150–400)
RBC: 3.69 MIL/uL — ABNORMAL LOW (ref 3.87–5.11)
RDW: 12.1 % (ref 11.5–15.5)
WBC Count: 5.4 10*3/uL (ref 4.0–10.5)
nRBC: 0 % (ref 0.0–0.2)

## 2020-06-22 LAB — TSH: TSH: 1.768 u[IU]/mL (ref 0.308–3.960)

## 2020-06-23 ENCOUNTER — Ambulatory Visit: Payer: Medicare Other

## 2020-06-26 ENCOUNTER — Other Ambulatory Visit: Payer: Self-pay | Admitting: Physician Assistant

## 2020-06-26 ENCOUNTER — Ambulatory Visit: Payer: Medicare Other

## 2020-06-26 DIAGNOSIS — Z1231 Encounter for screening mammogram for malignant neoplasm of breast: Secondary | ICD-10-CM

## 2020-06-26 NOTE — Progress Notes (Signed)
[facility] OFFICE PROGRESS NOTE  [name], [place] [address] [place] [address]  DIAGNOSIS: Stage IIIA(T1b, N2, M0) non-small cell lung cancer, adenocarcinoma presented with right upper lobe lung nodule as well as right upper paratracheal adenopathy and suspicious tiny nodule in the right middle lobe.  PRIOR THERAPY: None  CURRENT THERAPY: Neoadjuvant systemic chemotherapy with carboplatin for AUC of 5, Alimta 500 mg/M2 and Keytruda 200 mg IV every 3 weeks. First dose 06/08/2020. Status post 1 cycle.   INTERVAL HISTORY: [name] 69 y.o. female returns to the clinic today for a follow-up visit.  The patient is feeling well today without any concerning complaints except she is wondering if it is ok to see her dentist for the chief complaint of sore gums on the lower left gumline. She states this started a few days ago. She describes it as feeling tender. She denies fever, chills, swelling, or ulcerations.  The patient states that she is able to eat as she normally would. She underwent her first cycle of neoadjuvant chemotherapy and she tolerated it well without any adverse side effects except for constipation and a headache which has resolved at this time. She has stool softeners and laxatives at home if needed. She had a headache about 24 hours which has since resolved. Denies visual changes, seizures, or extremity weakness.  She denies any night sweats or weight loss.  She denies any chest pain, shortness of breath, cough, or hemoptysis.  She denies any nausea, vomiting, constipation, or diarrhea. She is here today for evaluation before starting cycle #2.   MEDICAL HISTORY: Past Medical History:  Diagnosis Date  . Cataract   . COPD (chronic obstructive pulmonary disease) (HCC)     ALLERGIES:  has No Known Allergies.  MEDICATIONS:  Current Outpatient Medications  Medication Sig Dispense Refill  . albuterol (PROVENTIL HFA;VENTOLIN HFA) 108 (90  Base) MCG/ACT inhaler Inhale 2 puffs into the lungs every 4 (four) hours as needed for wheezing or shortness of breath (cough, shortness of breath or wheezing.). 1 Inhaler 1  . Calcium Carbonate (CALCIUM 600 PO) Take 600 mg by mouth 2 (two) times daily.     . Cholecalciferol (VITAMIN D3 PO) Take 2,000 Units by mouth daily.    . fluticasone (FLONASE) 50 MCG/ACT nasal spray Place 2 sprays into both nostrils daily. 16 g 12  . Fluticasone-Salmeterol (ADVAIR) 250-50 MCG/DOSE AEPB Inhale 1 puff into the lungs 2 (two) times daily.     . folic acid (FOLVITE) 1 MG tablet Take 1 tablet (1 mg total) by mouth daily. 30 tablet 2  . Multiple Vitamin (MULTIVITAMIN) tablet Take 1 tablet by mouth daily.    . prednisoLONE acetate (PRED FORTE) 1 % ophthalmic suspension Place 1 drop into the left eye in the morning, at noon, and at bedtime.    . prochlorperazine (COMPAZINE) 10 MG tablet Take 1 tablet (10 mg total) by mouth every 6 (six) hours as needed. 30 tablet 2   No current facility-administered medications for this visit.    SURGICAL HISTORY:  Past Surgical History:  Procedure Laterality Date  . APPENDECTOMY  2008  . COLON SURGERY  2008   colostomy-infection post op appendectomy  . COLOSTOMY REVERSAL  2009  . VIDEO BRONCHOSCOPY WITH ENDOBRONCHIAL NAVIGATION N/A 05/08/2020   Procedure: VIDEO BRONCHOSCOPY WITH ENDOBRONCHIAL NAVIGATION;  Surgeon: [name], MD;  Location: [facility];  Service: Thoracic;  Laterality: N/A;  . VIDEO BRONCHOSCOPY WITH ENDOBRONCHIAL ULTRASOUND N/A 05/08/2020  Procedure: VIDEO BRONCHOSCOPY WITH ENDOBRONCHIAL ULTRASOUND;  Surgeon: [name], MD;  Location: [facility];  Service: Thoracic;  Laterality: N/A;    REVIEW OF SYSTEMS:   Review of Systems  Constitutional: Negative for appetite change, chills, fatigue, fever and unexpected weight change.  HENT: Positive for sore gum on the lower left gumline.  Negative for mouth ulcerations, nosebleeds, sore throat and  trouble swallowing.   Eyes: Negative for eye problems and icterus.  Respiratory: Negative for cough, hemoptysis, shortness of breath and wheezing.   Cardiovascular: Negative for chest pain and leg swelling.  Gastrointestinal: Negative for abdominal pain, constipation, diarrhea, nausea and vomiting.  Genitourinary: Negative for bladder incontinence, difficulty urinating, dysuria, frequency and hematuria.   Musculoskeletal: Negative for back pain, gait problem, neck pain and neck stiffness.  Skin: Negative for itching and rash.  Neurological: Negative for dizziness, extremity weakness, gait problem, headaches, light-headedness and seizures.  Hematological: Negative for adenopathy. Does not bruise/bleed easily.  Psychiatric/Behavioral: Negative for confusion, depression and sleep disturbance. The patient is not nervous/anxious.     PHYSICAL EXAMINATION:  Blood pressure (!) 113/54, pulse 65, temperature 97.8 F (36.6 C), temperature source Tympanic, resp. rate 18, height 5' 2.5" (1.588 m), weight 125 lb 4.8 oz (56.8 kg), SpO2 98 %.  ECOG PERFORMANCE STATUS: 1 - Symptomatic but completely ambulatory  Physical Exam  Constitutional: Oriented to person, place, and time and well-developed, well-nourished, and in no distress.   HENT:  Head: Normocephalic and atraumatic.  Mouth/Throat: Oropharynx is clear and moist.  Positive for receding gum lines bilaterally.  No obvious swelling or ulcerations.  She does have some granulation tissue at the site of soreness. Eyes: Conjunctivae are normal. Right eye exhibits no discharge. Left eye exhibits no discharge. No scleral icterus.  Neck: Normal range of motion. Neck supple.  Cardiovascular: Normal rate, regular rhythm, normal heart sounds and intact distal pulses.   Pulmonary/Chest: Effort normal and breath sounds normal. No respiratory distress. No wheezes. No rales.  Abdominal: Soft. Bowel sounds are normal. Exhibits no distension and no mass. There is  no tenderness.  Musculoskeletal: Normal range of motion. Exhibits no edema.  Lymphadenopathy:    No cervical adenopathy.  Neurological: Alert and oriented to person, place, and time. Exhibits normal muscle tone. Gait normal. Coordination normal.  Skin: Skin is warm and dry. No rash noted. Not diaphoretic. No erythema. No pallor.  Psychiatric: Mood, memory and judgment normal.  Vitals reviewed.  LABORATORY DATA: Lab Results  Component Value Date   WBC 5.6 06/29/2020   HGB 12.5 06/29/2020   HCT 37.8 06/29/2020   MCV 95.7 06/29/2020   PLT 206 06/29/2020      Chemistry      Component Value Date/Time   NA 139 06/22/2020 0754   NA 135 10/29/2017 1131   K 4.2 06/22/2020 0754   CL 104 06/22/2020 0754   CO2 31 06/22/2020 0754   BUN 9 06/22/2020 0754   BUN 5 (L) 10/29/2017 1131   CREATININE 0.63 06/22/2020 0754      Component Value Date/Time   CALCIUM 9.7 06/22/2020 0754   ALKPHOS 84 06/22/2020 0754   AST 23 06/22/2020 0754   ALT 24 06/22/2020 0754   BILITOT 0.5 06/22/2020 0754       RADIOGRAPHIC STUDIES:  No results found.   ASSESSMENT/PLAN:  This is a very pleasant 69 year old Caucasian female recently diagnosed with stage IIIa (T1b,N2, M0) non-small cell lung cancer, adenocarcinoma. She presented with a right upper lobe lung nodule in  addition to mediastinal lymphadenopathy. She was diagnosed in August 2021.  The plan is to proceed with neoadjuvant treatment with carboplatin for an AUC of 5, Alimta 500 mg per metered squared,andKeytruda 200 mg IV every 3 weeks for 3 cycles. She is status post 1 cycle and tolerated it well except for a headache and constipation which has since resolved.   The patient was seen with Dr. Julien Nordmann. Regarding her gum soreness, Dr. Julien Nordmann recommended that she follow up with her dentist but discussed that it was alright to proceed with cycle #2 today as scheduled. He did not feel that the area was infected or required antibiotics. No  leukocytosis on labs. No fevers, chills, swelling, or drainage.   We will see her back a follow up visit in 3 weeks for evaluation before starting cycle #3.   The patient has stool softeners and laxatives if needed for constipation if needed for future cycles of treatment.   The patient was advised to call immediately if she has any concerning symptoms in the interval. The patient voices understanding of current disease status and treatment options and is in agreement with the current care plan. All questions were answered. The patient knows to call the clinic with any problems, questions or concerns. We can certainly see the patient much sooner if necessary   No orders of the defined types were placed in this encounter.    Christina Cooner L Evette Diclemente, PA-C 06/29/20  ADDENDUM: Hematology/Oncology Attending: I had a face-to-face encounter with the patient today.  I recommended her care plan.  This is a very pleasant 70 years old white female diagnosed with a stage IIIa (T1b, N2, M0) non-small cell lung cancer, adenocarcinoma and she is currently undergoing neoadjuvant systemic chemotherapy with carboplatin for AUC of 5, Alimta 500 mg/M2 and Keytruda 200 mg IV every 3 weeks status post 1 cycle.  The patient tolerated the first cycle of her treatment well except for fatigue.  She also has some gum disease and soreness in the left lower gumline.  She is scheduled to see her dentist soon for evaluation of this condition.  The patient is currently asymptomatic with no concerning fever or chills and no other symptoms of infection. I recommended for her to proceed with cycle #2 of her treatment today as planned. She will come back for follow-up visit in 3 weeks for evaluation before starting cycle #3 of her treatment. She will have imaging studies after cycle #3. The patient was advised to call immediately if she has any concerning symptoms in the interval.  Disclaimer: This note was dictated with  voice recognition software. Similar sounding words can inadvertently be transcribed and may be missed upon review. Eilleen Kempf, MD 06/30/20

## 2020-06-27 ENCOUNTER — Ambulatory Visit: Payer: Medicare Other

## 2020-06-28 ENCOUNTER — Ambulatory Visit: Payer: Medicare Other

## 2020-06-29 ENCOUNTER — Inpatient Hospital Stay: Payer: Medicare Other

## 2020-06-29 ENCOUNTER — Other Ambulatory Visit: Payer: Self-pay

## 2020-06-29 ENCOUNTER — Ambulatory Visit: Payer: Medicare Other

## 2020-06-29 ENCOUNTER — Inpatient Hospital Stay: Payer: Medicare Other | Admitting: Physician Assistant

## 2020-06-29 VITALS — BP 113/54 | HR 65 | Temp 97.8°F | Resp 18 | Ht 62.5 in | Wt 125.3 lb

## 2020-06-29 DIAGNOSIS — C3411 Malignant neoplasm of upper lobe, right bronchus or lung: Secondary | ICD-10-CM

## 2020-06-29 DIAGNOSIS — Z5111 Encounter for antineoplastic chemotherapy: Secondary | ICD-10-CM | POA: Diagnosis not present

## 2020-06-29 DIAGNOSIS — Z5112 Encounter for antineoplastic immunotherapy: Secondary | ICD-10-CM | POA: Diagnosis not present

## 2020-06-29 DIAGNOSIS — K056 Periodontal disease, unspecified: Secondary | ICD-10-CM

## 2020-06-29 LAB — CMP (CANCER CENTER ONLY)
ALT: 37 U/L (ref 0–44)
AST: 30 U/L (ref 15–41)
Albumin: 3.5 g/dL (ref 3.5–5.0)
Alkaline Phosphatase: 86 U/L (ref 38–126)
Anion gap: 6 (ref 5–15)
BUN: 11 mg/dL (ref 8–23)
CO2: 27 mmol/L (ref 22–32)
Calcium: 9.7 mg/dL (ref 8.9–10.3)
Chloride: 106 mmol/L (ref 98–111)
Creatinine: 0.61 mg/dL (ref 0.44–1.00)
GFR, Estimated: >60 mL/min (ref >60)
Glucose, Bld: 87 mg/dL (ref 70–99)
Potassium: 4.1 mmol/L (ref 3.5–5.1)
Sodium: 139 mmol/L (ref 135–145)
Total Bilirubin: <0.2 mg/dL — ABNORMAL LOW (ref 0.3–1.2)
Total Protein: 6.7 g/dL (ref 6.5–8.1)

## 2020-06-29 LAB — CBC WITH DIFFERENTIAL (CANCER CENTER ONLY)
Abs Immature Granulocytes: 0.04 10*3/uL (ref 0.00–0.07)
Basophils Absolute: 0 10*3/uL (ref 0.0–0.1)
Basophils Relative: 1 %
Eosinophils Absolute: 0.1 10*3/uL (ref 0.0–0.5)
Eosinophils Relative: 1 %
HCT: 37.8 % (ref 36.0–46.0)
Hemoglobin: 12.5 g/dL (ref 12.0–15.0)
Immature Granulocytes: 1 %
Lymphocytes Relative: 22 %
Lymphs Abs: 1.3 10*3/uL (ref 0.7–4.0)
MCH: 31.6 pg (ref 26.0–34.0)
MCHC: 33.1 g/dL (ref 30.0–36.0)
MCV: 95.7 fL (ref 80.0–100.0)
Monocytes Absolute: 0.6 10*3/uL (ref 0.1–1.0)
Monocytes Relative: 11 %
Neutro Abs: 3.6 10*3/uL (ref 1.7–7.7)
Neutrophils Relative %: 64 %
Platelet Count: 206 10*3/uL (ref 150–400)
RBC: 3.95 MIL/uL (ref 3.87–5.11)
RDW: 12.2 % (ref 11.5–15.5)
WBC Count: 5.6 10*3/uL (ref 4.0–10.5)
nRBC: 0 % (ref 0.0–0.2)

## 2020-06-29 LAB — TSH: TSH: 1.141 u[IU]/mL (ref 0.308–3.960)

## 2020-06-29 MED ORDER — PALONOSETRON HCL INJECTION 0.25 MG/5ML
0.2500 mg | Freq: Once | INTRAVENOUS | Status: AC
  Administered 2020-06-29: 0.25 mg via INTRAVENOUS

## 2020-06-29 MED ORDER — SODIUM CHLORIDE 0.9 % IV SOLN
200.0000 mg | Freq: Once | INTRAVENOUS | Status: AC
  Administered 2020-06-29: 200 mg via INTRAVENOUS
  Filled 2020-06-29: qty 8

## 2020-06-29 MED ORDER — SODIUM CHLORIDE 0.9 % IV SOLN
Freq: Once | INTRAVENOUS | Status: AC
  Filled 2020-06-29: qty 250

## 2020-06-29 MED ORDER — SODIUM CHLORIDE 0.9 % IV SOLN
10.0000 mg | Freq: Once | INTRAVENOUS | Status: AC
  Administered 2020-06-29: 10 mg via INTRAVENOUS
  Filled 2020-06-29: qty 10

## 2020-06-29 MED ORDER — SODIUM CHLORIDE 0.9 % IV SOLN
355.0000 mg | Freq: Once | INTRAVENOUS | Status: AC
  Administered 2020-06-29: 360 mg via INTRAVENOUS
  Filled 2020-06-29: qty 36

## 2020-06-29 MED ORDER — SODIUM CHLORIDE 0.9 % IV SOLN
500.0000 mg/m2 | Freq: Once | INTRAVENOUS | Status: AC
  Administered 2020-06-29: 800 mg via INTRAVENOUS
  Filled 2020-06-29: qty 12

## 2020-06-29 MED ORDER — PALONOSETRON HCL INJECTION 0.25 MG/5ML
INTRAVENOUS | Status: AC
  Filled 2020-06-29: qty 5

## 2020-06-29 MED ORDER — SODIUM CHLORIDE 0.9 % IV SOLN
150.0000 mg | Freq: Once | INTRAVENOUS | Status: AC
  Administered 2020-06-29: 150 mg via INTRAVENOUS
  Filled 2020-06-29: qty 150

## 2020-06-29 NOTE — Patient Instructions (Signed)
[facility] Discharge Instructions for Patients Receiving Chemotherapy  Today you received the following chemotherapy agents: pembrolizumab/pemetrexed/carboplatin.  To help prevent nausea and vomiting after your treatment, we encourage you to take your nausea medication as directed.   If you develop nausea and vomiting that is not controlled by your nausea medication, call the clinic.   BELOW ARE SYMPTOMS THAT SHOULD BE REPORTED IMMEDIATELY:  *FEVER GREATER THAN 100.5 F  *CHILLS WITH OR WITHOUT FEVER  NAUSEA AND VOMITING THAT IS NOT CONTROLLED WITH YOUR NAUSEA MEDICATION  *UNUSUAL SHORTNESS OF BREATH  *UNUSUAL BRUISING OR BLEEDING  TENDERNESS IN MOUTH AND THROAT WITH OR WITHOUT PRESENCE OF ULCERS  *URINARY PROBLEMS  *BOWEL PROBLEMS  UNUSUAL RASH Items with * indicate a potential emergency and should be followed up as soon as possible.  Feel free to call the clinic should you have any questions or concerns. The clinic phone number is [phone].  Please show the [facility] at check-in to the Emergency Department and triage nurse.

## 2020-06-30 ENCOUNTER — Ambulatory Visit: Payer: Medicare Other

## 2020-06-30 ENCOUNTER — Encounter: Payer: Self-pay | Admitting: Physician Assistant

## 2020-07-03 ENCOUNTER — Ambulatory Visit: Payer: Medicare Other

## 2020-07-04 ENCOUNTER — Ambulatory Visit: Payer: Medicare Other

## 2020-07-05 ENCOUNTER — Ambulatory Visit: Payer: Medicare Other

## 2020-07-05 ENCOUNTER — Encounter: Payer: Self-pay | Admitting: *Deleted

## 2020-07-06 ENCOUNTER — Inpatient Hospital Stay: Payer: Medicare Other

## 2020-07-06 ENCOUNTER — Ambulatory Visit: Payer: Medicare Other

## 2020-07-06 ENCOUNTER — Other Ambulatory Visit: Payer: Self-pay

## 2020-07-06 DIAGNOSIS — C3411 Malignant neoplasm of upper lobe, right bronchus or lung: Secondary | ICD-10-CM | POA: Diagnosis not present

## 2020-07-06 LAB — CBC WITH DIFFERENTIAL (CANCER CENTER ONLY)
Abs Immature Granulocytes: 0.01 10*3/uL (ref 0.00–0.07)
Basophils Absolute: 0 10*3/uL (ref 0.0–0.1)
Basophils Relative: 1 %
Eosinophils Absolute: 0 10*3/uL (ref 0.0–0.5)
Eosinophils Relative: 1 %
HCT: 35.7 % — ABNORMAL LOW (ref 36.0–46.0)
Hemoglobin: 11.8 g/dL — ABNORMAL LOW (ref 12.0–15.0)
Immature Granulocytes: 0 %
Lymphocytes Relative: 35 %
Lymphs Abs: 1.5 10*3/uL (ref 0.7–4.0)
MCH: 32 pg (ref 26.0–34.0)
MCHC: 33.1 g/dL (ref 30.0–36.0)
MCV: 96.7 fL (ref 80.0–100.0)
Monocytes Absolute: 0.5 10*3/uL (ref 0.1–1.0)
Monocytes Relative: 12 %
Neutro Abs: 2.1 10*3/uL (ref 1.7–7.7)
Neutrophils Relative %: 51 %
Platelet Count: 132 10*3/uL — ABNORMAL LOW (ref 150–400)
RBC: 3.69 MIL/uL — ABNORMAL LOW (ref 3.87–5.11)
RDW: 11.7 % (ref 11.5–15.5)
WBC Count: 4.2 10*3/uL (ref 4.0–10.5)
nRBC: 0 % (ref 0.0–0.2)

## 2020-07-06 LAB — CMP (CANCER CENTER ONLY)
ALT: 65 U/L — ABNORMAL HIGH (ref 0–44)
AST: 37 U/L (ref 15–41)
Albumin: 3.7 g/dL (ref 3.5–5.0)
Alkaline Phosphatase: 109 U/L (ref 38–126)
Anion gap: 11 (ref 5–15)
BUN: 11 mg/dL (ref 8–23)
CO2: 26 mmol/L (ref 22–32)
Calcium: 9.9 mg/dL (ref 8.9–10.3)
Chloride: 103 mmol/L (ref 98–111)
Creatinine: 0.62 mg/dL (ref 0.44–1.00)
GFR, Estimated: >60 mL/min (ref >60)
Glucose, Bld: 87 mg/dL (ref 70–99)
Potassium: 4.3 mmol/L (ref 3.5–5.1)
Sodium: 140 mmol/L (ref 135–145)
Total Bilirubin: 0.4 mg/dL (ref 0.3–1.2)
Total Protein: 6.9 g/dL (ref 6.5–8.1)

## 2020-07-06 LAB — TSH: TSH: 0.364 u[IU]/mL (ref 0.308–3.960)

## 2020-07-13 ENCOUNTER — Inpatient Hospital Stay: Payer: Medicare Other

## 2020-07-13 ENCOUNTER — Other Ambulatory Visit: Payer: Self-pay

## 2020-07-13 DIAGNOSIS — C3411 Malignant neoplasm of upper lobe, right bronchus or lung: Secondary | ICD-10-CM | POA: Diagnosis not present

## 2020-07-13 LAB — CMP (CANCER CENTER ONLY)
ALT: 48 U/L — ABNORMAL HIGH (ref 0–44)
AST: 30 U/L (ref 15–41)
Albumin: 3.7 g/dL (ref 3.5–5.0)
Alkaline Phosphatase: 97 U/L (ref 38–126)
Anion gap: 9 (ref 5–15)
BUN: 9 mg/dL (ref 8–23)
CO2: 27 mmol/L (ref 22–32)
Calcium: 9.8 mg/dL (ref 8.9–10.3)
Chloride: 105 mmol/L (ref 98–111)
Creatinine: 0.62 mg/dL (ref 0.44–1.00)
GFR, Estimated: >60 mL/min (ref >60)
Glucose, Bld: 92 mg/dL (ref 70–99)
Potassium: 4.3 mmol/L (ref 3.5–5.1)
Sodium: 141 mmol/L (ref 135–145)
Total Bilirubin: 0.4 mg/dL (ref 0.3–1.2)
Total Protein: 6.8 g/dL (ref 6.5–8.1)

## 2020-07-13 LAB — CBC WITH DIFFERENTIAL (CANCER CENTER ONLY)
Abs Immature Granulocytes: 0.01 10*3/uL (ref 0.00–0.07)
Basophils Absolute: 0 10*3/uL (ref 0.0–0.1)
Basophils Relative: 1 %
Eosinophils Absolute: 0 10*3/uL (ref 0.0–0.5)
Eosinophils Relative: 1 %
HCT: 34.2 % — ABNORMAL LOW (ref 36.0–46.0)
Hemoglobin: 11.4 g/dL — ABNORMAL LOW (ref 12.0–15.0)
Immature Granulocytes: 0 %
Lymphocytes Relative: 32 %
Lymphs Abs: 1.1 10*3/uL (ref 0.7–4.0)
MCH: 32 pg (ref 26.0–34.0)
MCHC: 33.3 g/dL (ref 30.0–36.0)
MCV: 96.1 fL (ref 80.0–100.0)
Monocytes Absolute: 0.5 10*3/uL (ref 0.1–1.0)
Monocytes Relative: 14 %
Neutro Abs: 1.8 10*3/uL (ref 1.7–7.7)
Neutrophils Relative %: 52 %
Platelet Count: 205 10*3/uL (ref 150–400)
RBC: 3.56 MIL/uL — ABNORMAL LOW (ref 3.87–5.11)
RDW: 12 % (ref 11.5–15.5)
WBC Count: 3.4 10*3/uL — ABNORMAL LOW (ref 4.0–10.5)
nRBC: 0 % (ref 0.0–0.2)

## 2020-07-13 LAB — TSH: TSH: <0.08 u[IU]/mL — ABNORMAL LOW (ref 0.308–3.960)

## 2020-07-20 ENCOUNTER — Inpatient Hospital Stay: Payer: Medicare Other

## 2020-07-20 ENCOUNTER — Inpatient Hospital Stay: Payer: Medicare Other | Attending: Physician Assistant | Admitting: Internal Medicine

## 2020-07-20 ENCOUNTER — Other Ambulatory Visit: Payer: Self-pay

## 2020-07-20 ENCOUNTER — Encounter: Payer: Self-pay | Admitting: Internal Medicine

## 2020-07-20 VITALS — BP 100/59 | HR 77 | Temp 97.6°F | Resp 18 | Ht 62.5 in | Wt 125.4 lb

## 2020-07-20 DIAGNOSIS — Z5112 Encounter for antineoplastic immunotherapy: Secondary | ICD-10-CM | POA: Diagnosis not present

## 2020-07-20 DIAGNOSIS — C3411 Malignant neoplasm of upper lobe, right bronchus or lung: Secondary | ICD-10-CM

## 2020-07-20 DIAGNOSIS — Z5111 Encounter for antineoplastic chemotherapy: Secondary | ICD-10-CM

## 2020-07-20 DIAGNOSIS — C3490 Malignant neoplasm of unspecified part of unspecified bronchus or lung: Secondary | ICD-10-CM | POA: Diagnosis not present

## 2020-07-20 DIAGNOSIS — Z79899 Other long term (current) drug therapy: Secondary | ICD-10-CM | POA: Diagnosis not present

## 2020-07-20 LAB — CMP (CANCER CENTER ONLY)
ALT: 35 U/L (ref 0–44)
AST: 25 U/L (ref 15–41)
Albumin: 3.7 g/dL (ref 3.5–5.0)
Alkaline Phosphatase: 98 U/L (ref 38–126)
Anion gap: 8 (ref 5–15)
BUN: 10 mg/dL (ref 8–23)
CO2: 28 mmol/L (ref 22–32)
Calcium: 9.7 mg/dL (ref 8.9–10.3)
Chloride: 105 mmol/L (ref 98–111)
Creatinine: 0.59 mg/dL (ref 0.44–1.00)
GFR, Estimated: >60 mL/min (ref >60)
Glucose, Bld: 89 mg/dL (ref 70–99)
Potassium: 3.7 mmol/L (ref 3.5–5.1)
Sodium: 141 mmol/L (ref 135–145)
Total Bilirubin: 0.4 mg/dL (ref 0.3–1.2)
Total Protein: 6.7 g/dL (ref 6.5–8.1)

## 2020-07-20 LAB — CBC WITH DIFFERENTIAL (CANCER CENTER ONLY)
Abs Immature Granulocytes: 0.02 10*3/uL (ref 0.00–0.07)
Basophils Absolute: 0 10*3/uL (ref 0.0–0.1)
Basophils Relative: 1 %
Eosinophils Absolute: 0 10*3/uL (ref 0.0–0.5)
Eosinophils Relative: 1 %
HCT: 35.1 % — ABNORMAL LOW (ref 36.0–46.0)
Hemoglobin: 11.7 g/dL — ABNORMAL LOW (ref 12.0–15.0)
Immature Granulocytes: 0 %
Lymphocytes Relative: 31 %
Lymphs Abs: 1.6 10*3/uL (ref 0.7–4.0)
MCH: 31.5 pg (ref 26.0–34.0)
MCHC: 33.3 g/dL (ref 30.0–36.0)
MCV: 94.6 fL (ref 80.0–100.0)
Monocytes Absolute: 0.7 10*3/uL (ref 0.1–1.0)
Monocytes Relative: 13 %
Neutro Abs: 2.8 10*3/uL (ref 1.7–7.7)
Neutrophils Relative %: 54 %
Platelet Count: 238 10*3/uL (ref 150–400)
RBC: 3.71 MIL/uL — ABNORMAL LOW (ref 3.87–5.11)
RDW: 12.4 % (ref 11.5–15.5)
WBC Count: 5.2 10*3/uL (ref 4.0–10.5)
nRBC: 0 % (ref 0.0–0.2)

## 2020-07-20 LAB — TSH: TSH: <0.08 u[IU]/mL — ABNORMAL LOW (ref 0.308–3.960)

## 2020-07-20 MED ORDER — SODIUM CHLORIDE 0.9 % IV SOLN
150.0000 mg | Freq: Once | INTRAVENOUS | Status: AC
  Administered 2020-07-20: 150 mg via INTRAVENOUS
  Filled 2020-07-20: qty 150

## 2020-07-20 MED ORDER — SODIUM CHLORIDE 0.9 % IV SOLN
500.0000 mg/m2 | Freq: Once | INTRAVENOUS | Status: AC
  Administered 2020-07-20: 800 mg via INTRAVENOUS
  Filled 2020-07-20: qty 20

## 2020-07-20 MED ORDER — PALONOSETRON HCL INJECTION 0.25 MG/5ML
INTRAVENOUS | Status: AC
  Filled 2020-07-20: qty 5

## 2020-07-20 MED ORDER — SODIUM CHLORIDE 0.9 % IV SOLN
200.0000 mg | Freq: Once | INTRAVENOUS | Status: AC
  Administered 2020-07-20: 200 mg via INTRAVENOUS
  Filled 2020-07-20: qty 8

## 2020-07-20 MED ORDER — PALONOSETRON HCL INJECTION 0.25 MG/5ML
0.2500 mg | Freq: Once | INTRAVENOUS | Status: AC
  Administered 2020-07-20: 0.25 mg via INTRAVENOUS

## 2020-07-20 MED ORDER — SODIUM CHLORIDE 0.9 % IV SOLN
Freq: Once | INTRAVENOUS | Status: AC
  Filled 2020-07-20: qty 250

## 2020-07-20 MED ORDER — SODIUM CHLORIDE 0.9 % IV SOLN
10.0000 mg | Freq: Once | INTRAVENOUS | Status: AC
  Administered 2020-07-20: 10 mg via INTRAVENOUS
  Filled 2020-07-20: qty 10

## 2020-07-20 MED ORDER — CYANOCOBALAMIN 1000 MCG/ML IJ SOLN
INTRAMUSCULAR | Status: AC
  Filled 2020-07-20: qty 1

## 2020-07-20 MED ORDER — SODIUM CHLORIDE 0.9 % IV SOLN
355.0000 mg | Freq: Once | INTRAVENOUS | Status: AC
  Administered 2020-07-20: 360 mg via INTRAVENOUS
  Filled 2020-07-20: qty 36

## 2020-07-20 MED ORDER — CYANOCOBALAMIN 1000 MCG/ML IJ SOLN
1000.0000 ug | Freq: Once | INTRAMUSCULAR | Status: AC
  Administered 2020-07-20: 1000 ug via INTRAMUSCULAR

## 2020-07-20 NOTE — Progress Notes (Signed)
[facility] [phone]   Fax:(336) [phone]  OFFICE PROGRESS NOTE  [name], [place] [address] [place] [address]  DIAGNOSIS: Stage IIIA (T1b, N2, M0) non-small cell lung cancer, adenocarcinoma presented with right upper lobe lung nodule as well as right upper paratracheal adenopathy and suspicious tiny nodule in the right middle lobe.  PRIOR THERAPY: None.   CURRENT THERAPY: Neoadjuvant systemic chemotherapy with carboplatin for AUC of 5, Alimta 500 mg/M2 and Keytruda 200 mg IV every 3 weeks.  First dose 05/30/2020. Status post 2 cycles.  INTERVAL HISTORY: [name] 69 y.o. female returns to the clinic today for follow-up visit. The patient is feeling fine today with no concerning complaints except for occasional headache 2-3 times a week improved with Tylenol. She also has some mild pain and cold feeling in her feet. She denied having any chest pain, shortness of breath, cough or hemoptysis. She denied having any weight loss or night sweats. She has no nausea, vomiting, diarrhea or constipation. She denied having any headache or visual changes. She is here today for evaluation before starting cycle #3 of her treatment.  MEDICAL HISTORY: Past Medical History:  Diagnosis Date  . Cataract   . COPD (chronic obstructive pulmonary disease) (HCC)     ALLERGIES:  has No Known Allergies.  MEDICATIONS:  Current Outpatient Medications  Medication Sig Dispense Refill  . albuterol (PROVENTIL HFA;VENTOLIN HFA) 108 (90 Base) MCG/ACT inhaler Inhale 2 puffs into the lungs every 4 (four) hours as needed for wheezing or shortness of breath (cough, shortness of breath or wheezing.). 1 Inhaler 1  . Calcium Carbonate (CALCIUM 600 PO) Take 600 mg by mouth 2 (two) times daily.     . Cholecalciferol (VITAMIN D3 PO) Take 2,000 Units by mouth daily.    . fluticasone (FLONASE) 50 MCG/ACT nasal spray Place 2 sprays into both nostrils daily. 16 g 12   . Fluticasone-Salmeterol (ADVAIR) 250-50 MCG/DOSE AEPB Inhale 1 puff into the lungs 2 (two) times daily.     . folic acid (FOLVITE) 1 MG tablet Take 1 tablet (1 mg total) by mouth daily. 30 tablet 2  . Multiple Vitamin (MULTIVITAMIN) tablet Take 1 tablet by mouth daily.    . prednisoLONE acetate (PRED FORTE) 1 % ophthalmic suspension Place 1 drop into the left eye in the morning, at noon, and at bedtime.    . prochlorperazine (COMPAZINE) 10 MG tablet Take 1 tablet (10 mg total) by mouth every 6 (six) hours as needed. 30 tablet 2   No current facility-administered medications for this visit.    SURGICAL HISTORY:  Past Surgical History:  Procedure Laterality Date  . APPENDECTOMY  2008  . COLON SURGERY  2008   colostomy-infection post op appendectomy  . COLOSTOMY REVERSAL  2009  . VIDEO BRONCHOSCOPY WITH ENDOBRONCHIAL NAVIGATION N/A 05/08/2020   Procedure: VIDEO BRONCHOSCOPY WITH ENDOBRONCHIAL NAVIGATION;  Surgeon: [name], MD;  Location: [facility];  Service: Thoracic;  Laterality: N/A;  . VIDEO BRONCHOSCOPY WITH ENDOBRONCHIAL ULTRASOUND N/A 05/08/2020   Procedure: VIDEO BRONCHOSCOPY WITH ENDOBRONCHIAL ULTRASOUND;  Surgeon: [name], MD;  Location: [facility];  Service: Thoracic;  Laterality: N/A;    REVIEW OF SYSTEMS:  A comprehensive review of systems was negative except for: Neurological: positive for headaches   PHYSICAL EXAMINATION: General appearance: alert, cooperative and no distress Head: Normocephalic, without obvious abnormality, atraumatic Neck: no adenopathy, no JVD, supple, symmetrical, trachea midline and thyroid not enlarged,  symmetric, no tenderness/mass/nodules Lymph nodes: Cervical, supraclavicular, and axillary nodes normal. Resp: clear to auscultation bilaterally Back: symmetric, no curvature. ROM normal. No CVA tenderness. Cardio: regular rate and rhythm, S1, S2 normal, no murmur, click, rub or gallop GI: soft, non-tender; bowel sounds normal; no  masses,  no organomegaly Extremities: extremities normal, atraumatic, no cyanosis or edema  ECOG PERFORMANCE STATUS: 1 - Symptomatic but completely ambulatory  Blood pressure (!) 100/59, pulse 77, temperature 97.6 F (36.4 C), temperature source Tympanic, resp. rate 18, height 5' 2.5" (1.588 m), weight 125 lb 6.4 oz (56.9 kg), SpO2 99 %.  LABORATORY DATA: Lab Results  Component Value Date   WBC 5.2 07/20/2020   HGB 11.7 (L) 07/20/2020   HCT 35.1 (L) 07/20/2020   MCV 94.6 07/20/2020   PLT 238 07/20/2020      Chemistry      Component Value Date/Time   NA 141 07/20/2020 0849   NA 135 10/29/2017 1131   K 3.7 07/20/2020 0849   CL 105 07/20/2020 0849   CO2 28 07/20/2020 0849   BUN 10 07/20/2020 0849   BUN 5 (L) 10/29/2017 1131   CREATININE 0.59 07/20/2020 0849      Component Value Date/Time   CALCIUM 9.7 07/20/2020 0849   ALKPHOS 98 07/20/2020 0849   AST 25 07/20/2020 0849   ALT 35 07/20/2020 0849   BILITOT 0.4 07/20/2020 0849       RADIOGRAPHIC STUDIES: No results found.  ASSESSMENT AND PLAN: This is a very pleasant 69 years old white female recently diagnosed with a stage IIIa (T1b, N2, M0) non-small cell lung cancer, adenocarcinoma presented with right upper lobe lung nodule in addition to mediastinal lymphadenopathy. The patient had MRI of the brain performed recently that showed no evidence of intracranial metastatic disease. The patient is currently on neoadjuvant treatment with carboplatin for AUC of 5, Alimta 500 mg/M2 and Keytruda 200 mg IV every 3 weeks for 3 cycles.  The first cycle of her treatment schedule on 05/30/2020. She is status post 2 cycles. I recommended for her to proceed with cycle #3 today as planned. She will come back for follow-up visit in less than 4 weeks with repeat CT scan of the chest for restaging of her disease. She was advised to call immediately if she has any concerning symptoms in the interval. The patient voices understanding of  current disease status and treatment options and is in agreement with the current care plan.  All questions were answered. The patient knows to call the clinic with any problems, questions or concerns. We can certainly see the patient much sooner if necessary.   Disclaimer: This note was dictated with voice recognition software. Similar sounding words can inadvertently be transcribed and may not be corrected upon review.

## 2020-07-21 ENCOUNTER — Telehealth: Payer: Self-pay | Admitting: Internal Medicine

## 2020-07-21 NOTE — Telephone Encounter (Signed)
Scheduled per los. Called and spoke with patient. Confirmed appt 

## 2020-07-25 ENCOUNTER — Telehealth: Payer: Self-pay | Admitting: *Deleted

## 2020-07-25 ENCOUNTER — Telehealth: Payer: Self-pay

## 2020-07-25 NOTE — Telephone Encounter (Signed)
The following notification was received: "Received call from pt's husband stating that pt has a nose bleed & is concerned.  Jasime came to phone & states nose just started bleeding when she was sitting reading the paper.  She says it bled @ 2 min.  It has stopped now.  She has an appt for labs tomorrow.  Informed that it may just be from dry heat in the home & suggested some neosporin in nostrils am & pm.  She denies any bruising other than chemo IV site".   I spoke with pt who advised her nose bled for 1-2 min and has not restarted. Pt denies any further sx or concerns.  Pt has a lab appt tomorrow 07/26/20 and I advised her we will contact her if there is anything concerning.

## 2020-07-25 NOTE — Telephone Encounter (Signed)
Received call from pt's husband stating that pt has a nose bleed & is concerned.  [name] came to phone & states nose just started bleeding when she was sitting reading the paper.  She says it bled @ 2 min.  It has stopped now.  She has an appt for labs tomorrow.  Informed that it may just be from dry heat in the home & suggested some neosporin in nostrils am & pm.  She denies any bruising other than chemo IV site.  Encouraged to call Dr Worthy Flank RN if further problems.  Message routed to that pod.

## 2020-07-26 ENCOUNTER — Inpatient Hospital Stay: Payer: Medicare Other

## 2020-07-26 ENCOUNTER — Encounter: Payer: Self-pay | Admitting: Internal Medicine

## 2020-07-26 ENCOUNTER — Other Ambulatory Visit: Payer: Self-pay

## 2020-07-26 DIAGNOSIS — C3411 Malignant neoplasm of upper lobe, right bronchus or lung: Secondary | ICD-10-CM | POA: Diagnosis not present

## 2020-07-26 LAB — CBC WITH DIFFERENTIAL (CANCER CENTER ONLY)
Abs Immature Granulocytes: 0.01 10*3/uL (ref 0.00–0.07)
Basophils Absolute: 0 10*3/uL (ref 0.0–0.1)
Basophils Relative: 1 %
Eosinophils Absolute: 0 10*3/uL (ref 0.0–0.5)
Eosinophils Relative: 1 %
HCT: 32.5 % — ABNORMAL LOW (ref 36.0–46.0)
Hemoglobin: 10.8 g/dL — ABNORMAL LOW (ref 12.0–15.0)
Immature Granulocytes: 0 %
Lymphocytes Relative: 25 %
Lymphs Abs: 0.9 10*3/uL (ref 0.7–4.0)
MCH: 32.2 pg (ref 26.0–34.0)
MCHC: 33.2 g/dL (ref 30.0–36.0)
MCV: 97 fL (ref 80.0–100.0)
Monocytes Absolute: 0.5 10*3/uL (ref 0.1–1.0)
Monocytes Relative: 13 %
Neutro Abs: 2.3 10*3/uL (ref 1.7–7.7)
Neutrophils Relative %: 60 %
Platelet Count: 146 10*3/uL — ABNORMAL LOW (ref 150–400)
RBC: 3.35 MIL/uL — ABNORMAL LOW (ref 3.87–5.11)
RDW: 12 % (ref 11.5–15.5)
WBC Count: 3.7 10*3/uL — ABNORMAL LOW (ref 4.0–10.5)
nRBC: 0 % (ref 0.0–0.2)

## 2020-07-26 LAB — CMP (CANCER CENTER ONLY)
ALT: 38 U/L (ref 0–44)
AST: 34 U/L (ref 15–41)
Albumin: 3.6 g/dL (ref 3.5–5.0)
Alkaline Phosphatase: 90 U/L (ref 38–126)
Anion gap: 8 (ref 5–15)
BUN: 11 mg/dL (ref 8–23)
CO2: 29 mmol/L (ref 22–32)
Calcium: 9.1 mg/dL (ref 8.9–10.3)
Chloride: 103 mmol/L (ref 98–111)
Creatinine: 0.65 mg/dL (ref 0.44–1.00)
GFR, Estimated: >60 mL/min (ref >60)
Glucose, Bld: 102 mg/dL — ABNORMAL HIGH (ref 70–99)
Potassium: 4 mmol/L (ref 3.5–5.1)
Sodium: 140 mmol/L (ref 135–145)
Total Bilirubin: 0.5 mg/dL (ref 0.3–1.2)
Total Protein: 6.8 g/dL (ref 6.5–8.1)

## 2020-07-26 LAB — TSH: TSH: <0.08 u[IU]/mL — ABNORMAL LOW (ref 0.308–3.960)

## 2020-07-26 NOTE — Progress Notes (Signed)
Met with patient at registration to introduce myself as Arboriculturist and to offer available resources.  Discussed one-time $1000 Radio broadcast assistant to assist with personal expenses while going through treatment.  Gave her my card if interested in applying and for any additional financial questions or concerns.

## 2020-07-27 ENCOUNTER — Encounter: Payer: Self-pay | Admitting: Internal Medicine

## 2020-07-27 NOTE — Progress Notes (Signed)
Patient's spouse called to inquire about Advertising account executive. Advised what is needed to apply.  He will bring proof of income on 11/17 if they decide to apply.  They have my card for any additional financial questions or concerns.

## 2020-08-01 ENCOUNTER — Other Ambulatory Visit: Payer: Self-pay | Admitting: *Deleted

## 2020-08-01 DIAGNOSIS — C3411 Malignant neoplasm of upper lobe, right bronchus or lung: Secondary | ICD-10-CM

## 2020-08-02 ENCOUNTER — Other Ambulatory Visit: Payer: Self-pay

## 2020-08-02 ENCOUNTER — Encounter: Payer: Self-pay | Admitting: Internal Medicine

## 2020-08-02 ENCOUNTER — Inpatient Hospital Stay: Payer: Medicare Other

## 2020-08-02 DIAGNOSIS — C3411 Malignant neoplasm of upper lobe, right bronchus or lung: Secondary | ICD-10-CM

## 2020-08-02 LAB — CBC WITH DIFFERENTIAL (CANCER CENTER ONLY)
Abs Immature Granulocytes: 0.01 10*3/uL (ref 0.00–0.07)
Basophils Absolute: 0 10*3/uL (ref 0.0–0.1)
Basophils Relative: 0 %
Eosinophils Absolute: 0 10*3/uL (ref 0.0–0.5)
Eosinophils Relative: 0 %
HCT: 32 % — ABNORMAL LOW (ref 36.0–46.0)
Hemoglobin: 10.6 g/dL — ABNORMAL LOW (ref 12.0–15.0)
Immature Granulocytes: 0 %
Lymphocytes Relative: 24 %
Lymphs Abs: 1.2 10*3/uL (ref 0.7–4.0)
MCH: 32.2 pg (ref 26.0–34.0)
MCHC: 33.1 g/dL (ref 30.0–36.0)
MCV: 97.3 fL (ref 80.0–100.0)
Monocytes Absolute: 0.7 10*3/uL (ref 0.1–1.0)
Monocytes Relative: 13 %
Neutro Abs: 3.2 10*3/uL (ref 1.7–7.7)
Neutrophils Relative %: 63 %
Platelet Count: 193 10*3/uL (ref 150–400)
RBC: 3.29 MIL/uL — ABNORMAL LOW (ref 3.87–5.11)
RDW: 12.8 % (ref 11.5–15.5)
WBC Count: 5.2 10*3/uL (ref 4.0–10.5)
nRBC: 0 % (ref 0.0–0.2)

## 2020-08-02 LAB — CMP (CANCER CENTER ONLY)
ALT: 28 U/L (ref 0–44)
AST: 25 U/L (ref 15–41)
Albumin: 3.4 g/dL — ABNORMAL LOW (ref 3.5–5.0)
Alkaline Phosphatase: 79 U/L (ref 38–126)
Anion gap: 9 (ref 5–15)
BUN: 11 mg/dL (ref 8–23)
CO2: 28 mmol/L (ref 22–32)
Calcium: 9.3 mg/dL (ref 8.9–10.3)
Chloride: 106 mmol/L (ref 98–111)
Creatinine: 0.59 mg/dL (ref 0.44–1.00)
GFR, Estimated: >60 mL/min (ref >60)
Glucose, Bld: 92 mg/dL (ref 70–99)
Potassium: 3.8 mmol/L (ref 3.5–5.1)
Sodium: 143 mmol/L (ref 135–145)
Total Bilirubin: 0.3 mg/dL (ref 0.3–1.2)
Total Protein: 6.4 g/dL — ABNORMAL LOW (ref 6.5–8.1)

## 2020-08-02 LAB — TSH: TSH: <0.08 u[IU]/mL — ABNORMAL LOW (ref 0.308–3.960)

## 2020-08-02 NOTE — Progress Notes (Signed)
Met with patient at registration whom brought proof of income for [employer].  Patient approved for one-time $1000 [employer] grant to assist with personal expenses while going through treatment. Discussed in detail expenses and how they are covered. Gave her a copy of the approval letter and expense sheet along with the Outpatient pharmacy information. She received a gift card today from her grant.  She has my card for any additional financial questions or concerns.

## 2020-08-07 ENCOUNTER — Ambulatory Visit: Payer: Medicare Other

## 2020-08-09 ENCOUNTER — Other Ambulatory Visit: Payer: Self-pay

## 2020-08-09 ENCOUNTER — Other Ambulatory Visit: Payer: Self-pay | Admitting: Medical Oncology

## 2020-08-09 ENCOUNTER — Inpatient Hospital Stay: Payer: Medicare Other

## 2020-08-09 DIAGNOSIS — C3411 Malignant neoplasm of upper lobe, right bronchus or lung: Secondary | ICD-10-CM

## 2020-08-09 LAB — CMP (CANCER CENTER ONLY)
ALT: 33 U/L (ref 0–44)
AST: 28 U/L (ref 15–41)
Albumin: 3.5 g/dL (ref 3.5–5.0)
Alkaline Phosphatase: 82 U/L (ref 38–126)
Anion gap: 6 (ref 5–15)
BUN: 12 mg/dL (ref 8–23)
CO2: 28 mmol/L (ref 22–32)
Calcium: 9.6 mg/dL (ref 8.9–10.3)
Chloride: 107 mmol/L (ref 98–111)
Creatinine: 0.64 mg/dL (ref 0.44–1.00)
GFR, Estimated: >60 mL/min (ref >60)
Glucose, Bld: 97 mg/dL (ref 70–99)
Potassium: 4.3 mmol/L (ref 3.5–5.1)
Sodium: 141 mmol/L (ref 135–145)
Total Bilirubin: 0.4 mg/dL (ref 0.3–1.2)
Total Protein: 6.6 g/dL (ref 6.5–8.1)

## 2020-08-09 LAB — CBC WITH DIFFERENTIAL (CANCER CENTER ONLY)
Abs Immature Granulocytes: 0.01 10*3/uL (ref 0.00–0.07)
Basophils Absolute: 0 10*3/uL (ref 0.0–0.1)
Basophils Relative: 0 %
Eosinophils Absolute: 0 10*3/uL (ref 0.0–0.5)
Eosinophils Relative: 1 %
HCT: 34.1 % — ABNORMAL LOW (ref 36.0–46.0)
Hemoglobin: 11.4 g/dL — ABNORMAL LOW (ref 12.0–15.0)
Immature Granulocytes: 0 %
Lymphocytes Relative: 29 %
Lymphs Abs: 1.4 10*3/uL (ref 0.7–4.0)
MCH: 32.9 pg (ref 26.0–34.0)
MCHC: 33.4 g/dL (ref 30.0–36.0)
MCV: 98.3 fL (ref 80.0–100.0)
Monocytes Absolute: 0.7 10*3/uL (ref 0.1–1.0)
Monocytes Relative: 15 %
Neutro Abs: 2.6 10*3/uL (ref 1.7–7.7)
Neutrophils Relative %: 55 %
Platelet Count: 197 10*3/uL (ref 150–400)
RBC: 3.47 MIL/uL — ABNORMAL LOW (ref 3.87–5.11)
RDW: 13.1 % (ref 11.5–15.5)
WBC Count: 4.7 10*3/uL (ref 4.0–10.5)
nRBC: 0 % (ref 0.0–0.2)

## 2020-08-14 ENCOUNTER — Encounter (HOSPITAL_COMMUNITY): Payer: Self-pay

## 2020-08-14 ENCOUNTER — Ambulatory Visit (HOSPITAL_COMMUNITY)
Admission: RE | Admit: 2020-08-14 | Discharge: 2020-08-14 | Disposition: A | Discharge status: Home / Self Care | Payer: Medicare Other | Source: Ambulatory Visit | Attending: Internal Medicine | Admitting: Internal Medicine

## 2020-08-14 ENCOUNTER — Other Ambulatory Visit: Payer: Self-pay

## 2020-08-14 DIAGNOSIS — C3490 Malignant neoplasm of unspecified part of unspecified bronchus or lung: Secondary | ICD-10-CM

## 2020-08-14 IMAGING — CT CT CHEST W/ CM
2 of 4 series · 15 of 36 positions shown, 18 images · IV contrast (OMNIPAQUE)
Comparison: PET-CT [DATE]

CLINICAL DATA: Restaging right upper lobe non-small cell lung
cancer

EXAM:
CT CHEST WITH CONTRAST
TECHNIQUE: Multidetector CT imaging of the chest was performed during
intravenous contrast administration.
CONTRAST:  75mL OMNIPAQUE IOHEXOL 300 MG/ML  SOLN

[Series 2: axial st · axial · 0.69mm/px · z∈[-319,-31]mm · 12 of 169 slices shown, 15 images]
[im 13/169  mediastinal]
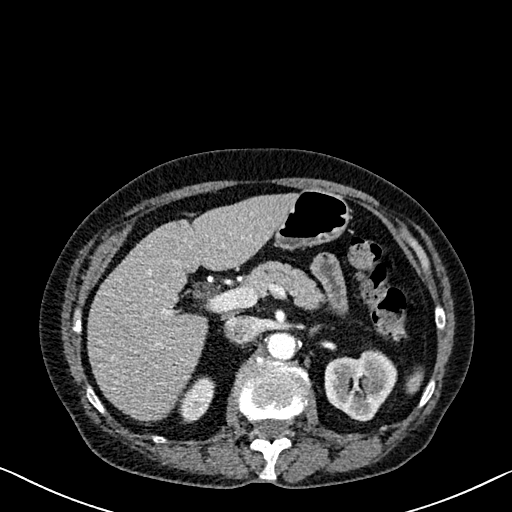
[im 13/169  lung]
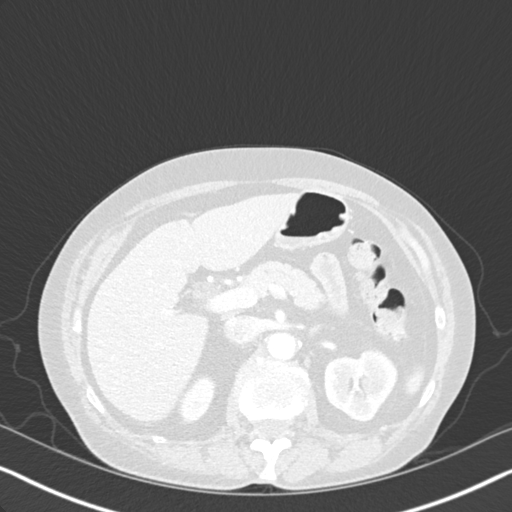
[im 25/169  lung]
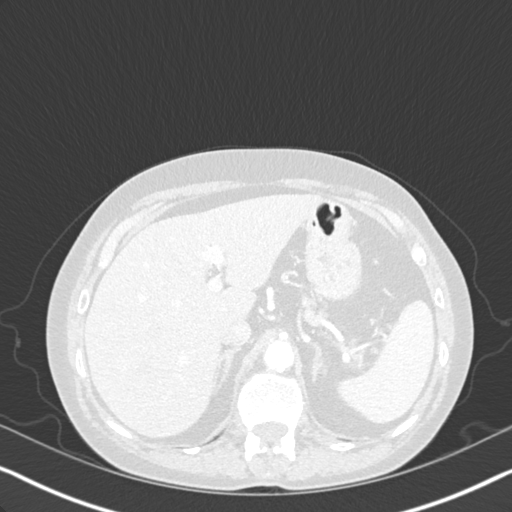
[im 37/169  lung]
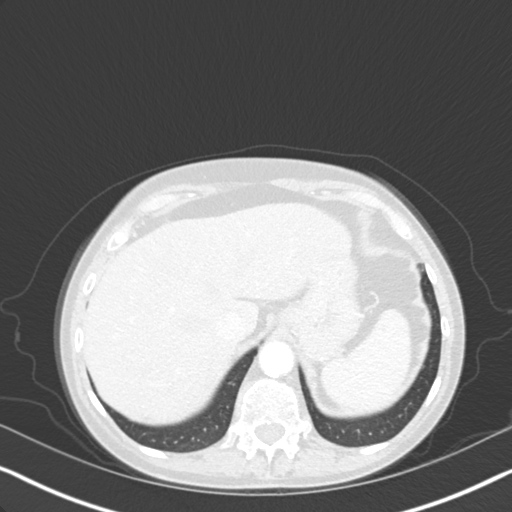
[im 49/169  lung]
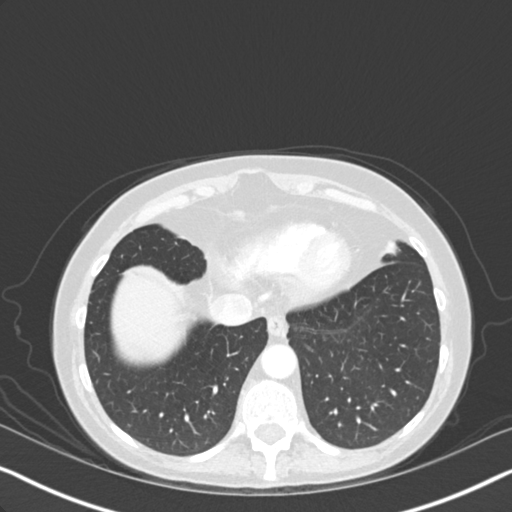
[im 61/169  mediastinal]
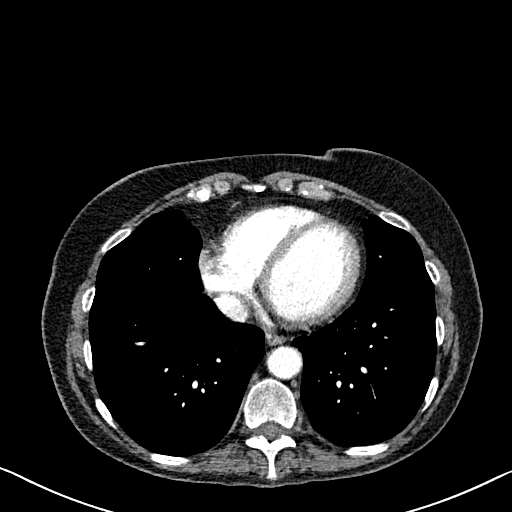
[im 61/169  lung]
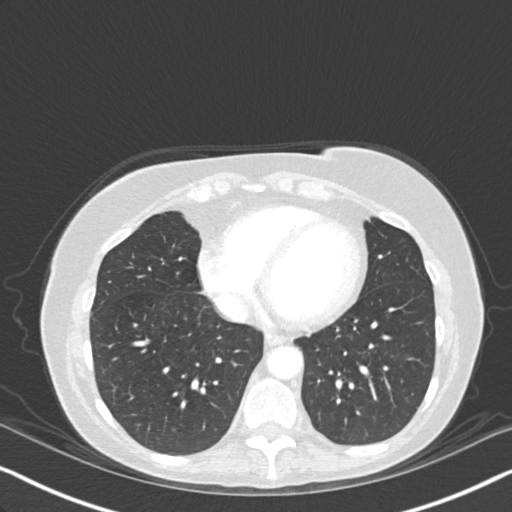
[im 73/169  lung]
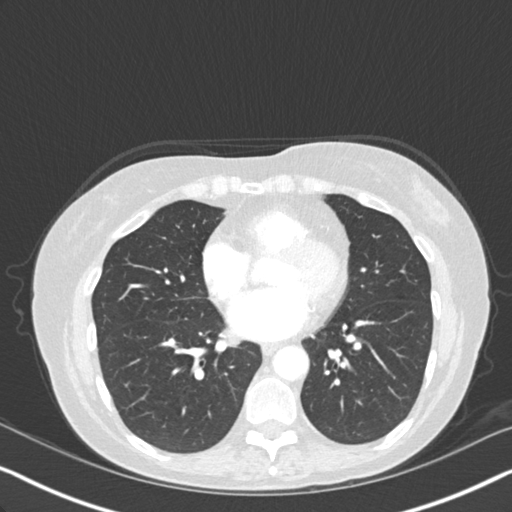
[im 97/169  lung]
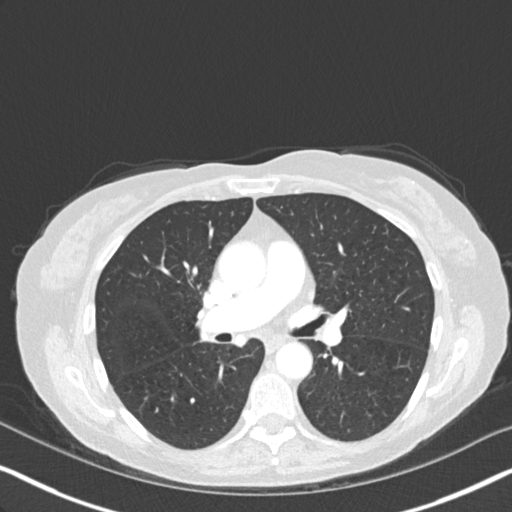
[im 109/169  lung]
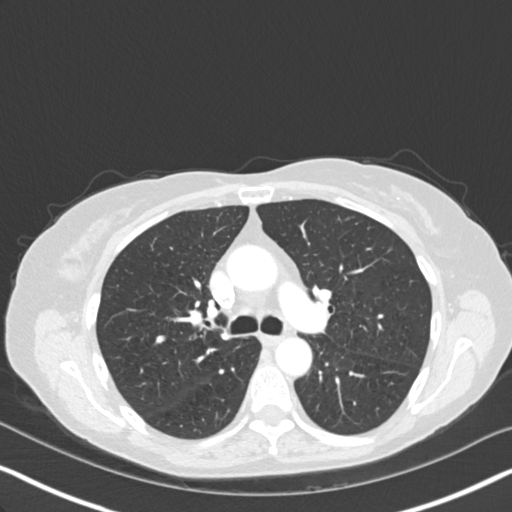
[im 121/169  mediastinal]
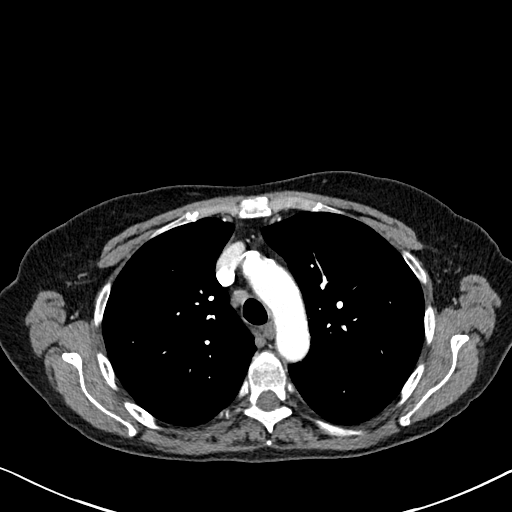
[im 121/169  lung]
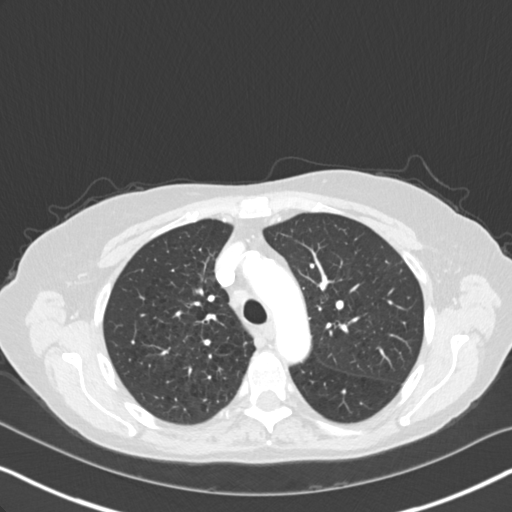
[im 133/169  lung]
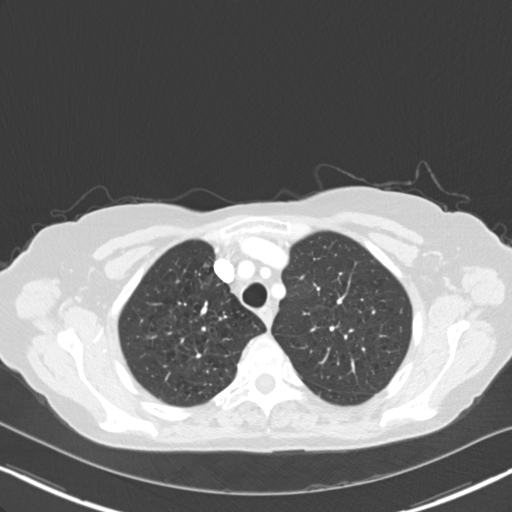
[im 145/169  lung]
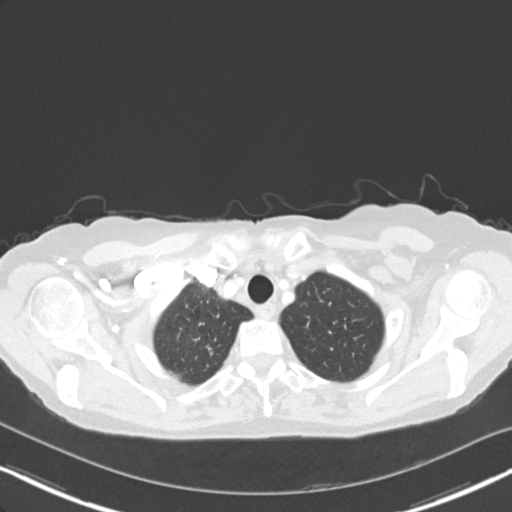
[im 157/169  lung]
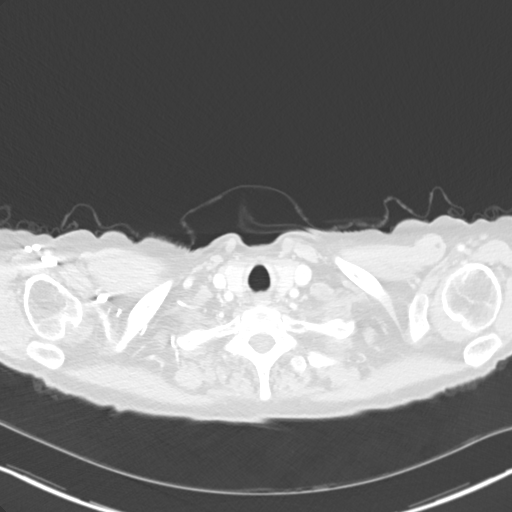

[Series 5: coronal · coronal · 0.57mm/px · 3 of 119 slices shown]
[im 24/119  lung]
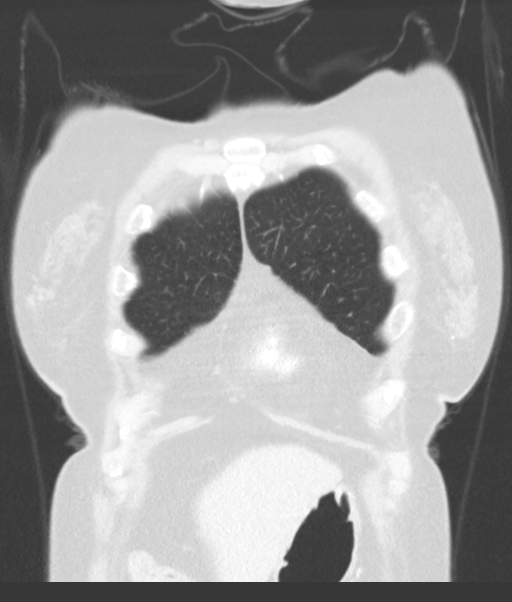
[im 48/119  lung]
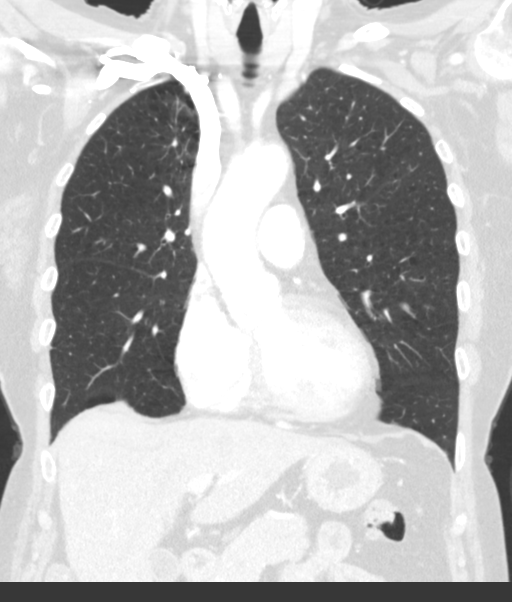
[im 71/119  lung]
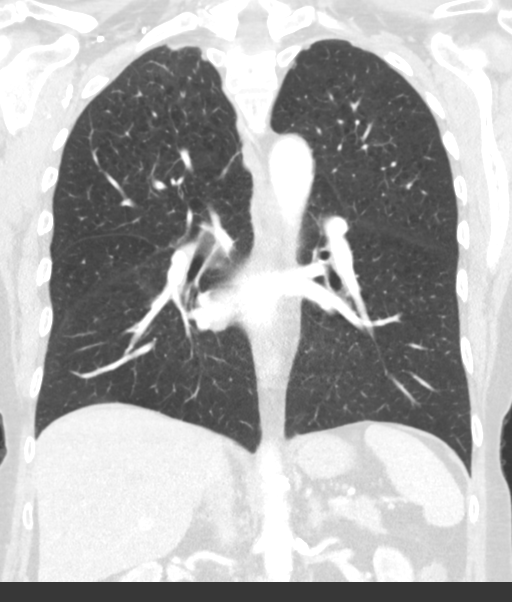

[15 of 36 positions shown; findings below may reference images not displayed]

FINDINGS: Cardiovascular: The heart size appears within normal limits. No
pericardial effusion identified. Aortic atherosclerosis. Coronary
artery calcifications.

Mediastinum/Nodes: Normal appearance of the thyroid gland. The
trachea appears patent and is midline. Normal appearance of the
esophagus. No enlarged axillary or supraclavicular lymph nodes.
Higher right paratracheal lymph node measures 0.5 cm, image 46/2.
Previously 0.8 cm. Lower right paratracheal lymph node measures
mm, image 52/2. Previously 1.1 cm.

Lungs/Pleura: Index nodule within the right upper lobe anteriorly
measures 0.9 x 0.8 by 0.6 cm (volume = 0.2 cm^3), image 43/7.
Previously 1.0 x 0.9 by 0.9 cm (volume = 0.4 cm^3). No new lung
nodules.

Moderate centrilobular emphysema. No pleural effusion, airspace
consolidation or atelectasis.

Upper Abdomen: No acute abnormality.

Musculoskeletal: No chest wall abnormality. No acute or significant
osseous findings.
IMPRESSION: 1. Approximate 50% decreased in total lesion volume of anterior
right upper lobe index nodule.
2. Decrease in size of previous FDG avid right paratracheal lymph
nodes
3. No new sites of disease
4. Emphysema and aortic atherosclerosis.
5. Coronary artery calcifications.

Aortic Atherosclerosis ([32]-[32]) and Emphysema ([32]-[32]).

## 2020-08-14 MED ORDER — IOHEXOL 300 MG/ML  SOLN
75.0000 mL | Freq: Once | INTRAMUSCULAR | Status: AC | PRN
  Administered 2020-08-14: 75 mL via INTRAVENOUS

## 2020-08-16 ENCOUNTER — Other Ambulatory Visit: Payer: Self-pay

## 2020-08-16 ENCOUNTER — Encounter: Payer: Self-pay | Admitting: Internal Medicine

## 2020-08-16 ENCOUNTER — Inpatient Hospital Stay: Payer: Medicare Other | Attending: Physician Assistant | Admitting: Internal Medicine

## 2020-08-16 VITALS — BP 117/67 | HR 67 | Temp 97.8°F | Resp 18 | Ht 62.0 in | Wt 124.3 lb

## 2020-08-16 DIAGNOSIS — Z5112 Encounter for antineoplastic immunotherapy: Secondary | ICD-10-CM

## 2020-08-16 DIAGNOSIS — C3411 Malignant neoplasm of upper lobe, right bronchus or lung: Secondary | ICD-10-CM | POA: Diagnosis present

## 2020-08-16 DIAGNOSIS — Z5111 Encounter for antineoplastic chemotherapy: Secondary | ICD-10-CM

## 2020-08-16 NOTE — Progress Notes (Signed)
[facility] [phone]   Fax:(336) [phone]  OFFICE PROGRESS NOTE  [name], [place] [address] [place] [address]  DIAGNOSIS: Stage IIIA (T1b, N2, M0) non-small cell lung cancer, adenocarcinoma presented with right upper lobe lung nodule as well as right upper paratracheal adenopathy and suspicious tiny nodule in the right middle lobe.  PRIOR THERAPY: Neoadjuvant systemic chemotherapy with carboplatin for AUC of 5, Alimta 500 mg/M2 and Keytruda 200 mg IV every 3 weeks.  First dose 05/30/2020. Status post 3 cycles.    CURRENT THERAPY: None.  INTERVAL HISTORY: [name] 69 y.o. female returns to the clinic today for follow-up visit accompanied by her husband.  The patient is feeling fine today with no concerning complaints except for dry skin and itching.  She tolerated the previous 3 cycles of neoadjuvant chemotherapy with immunotherapy fairly well.  She denied having any current chest pain, shortness of breath, cough or hemoptysis.  She denied having any nausea, vomiting, diarrhea or constipation.  She has no headache or visual changes.  The patient had repeat CT scan of the chest performed recently and she is here for evaluation and discussion of her risk her results and recommendation regarding her condition.  MEDICAL HISTORY: Past Medical History:  Diagnosis Date  . Cataract   . COPD (chronic obstructive pulmonary disease) (HCC)     ALLERGIES:  has No Known Allergies.  MEDICATIONS:  Current Outpatient Medications  Medication Sig Dispense Refill  . Calcium Carbonate (CALCIUM 600 PO) Take 600 mg by mouth 2 (two) times daily.     . Cholecalciferol (VITAMIN D3 PO) Take 2,000 Units by mouth daily.    . fluticasone (FLONASE) 50 MCG/ACT nasal spray Place 2 sprays into both nostrils daily. 16 g 12  . Fluticasone-Salmeterol (ADVAIR) 250-50 MCG/DOSE AEPB Inhale 1 puff into the lungs 2 (two) times daily.     . folic acid  (FOLVITE) 1 MG tablet Take 1 tablet (1 mg total) by mouth daily. 30 tablet 2  . Multiple Vitamin (MULTIVITAMIN) tablet Take 1 tablet by mouth daily.    Marland Kitchen albuterol (PROVENTIL HFA;VENTOLIN HFA) 108 (90 Base) MCG/ACT inhaler Inhale 2 puffs into the lungs every 4 (four) hours as needed for wheezing or shortness of breath (cough, shortness of breath or wheezing.). (Patient not taking: Reported on 08/16/2020) 1 Inhaler 1  . prochlorperazine (COMPAZINE) 10 MG tablet Take 1 tablet (10 mg total) by mouth every 6 (six) hours as needed. (Patient not taking: Reported on 08/16/2020) 30 tablet 2   No current facility-administered medications for this visit.    SURGICAL HISTORY:  Past Surgical History:  Procedure Laterality Date  . APPENDECTOMY  2008  . COLON SURGERY  2008   colostomy-infection post op appendectomy  . COLOSTOMY REVERSAL  2009  . VIDEO BRONCHOSCOPY WITH ENDOBRONCHIAL NAVIGATION N/A 05/08/2020   Procedure: VIDEO BRONCHOSCOPY WITH ENDOBRONCHIAL NAVIGATION;  Surgeon: [name], MD;  Location: [facility];  Service: Thoracic;  Laterality: N/A;  . VIDEO BRONCHOSCOPY WITH ENDOBRONCHIAL ULTRASOUND N/A 05/08/2020   Procedure: VIDEO BRONCHOSCOPY WITH ENDOBRONCHIAL ULTRASOUND;  Surgeon: [name], MD;  Location: [facility];  Service: Thoracic;  Laterality: N/A;    REVIEW OF SYSTEMS:  Constitutional: negative Eyes: negative Ears, nose, mouth, throat, and face: negative Respiratory: negative Cardiovascular: negative Gastrointestinal: negative Genitourinary:negative Integument/breast: positive for dryness and pruritus Hematologic/lymphatic: negative Musculoskeletal:negative Neurological: negative Behavioral/Psych: negative Endocrine: negative Allergic/Immunologic: negative   PHYSICAL EXAMINATION: General appearance: alert, cooperative  and no distress Head: Normocephalic, without obvious abnormality, atraumatic Neck: no adenopathy, no JVD, supple, symmetrical, trachea midline and  thyroid not enlarged, symmetric, no tenderness/mass/nodules Lymph nodes: Cervical, supraclavicular, and axillary nodes normal. Resp: clear to auscultation bilaterally Back: symmetric, no curvature. ROM normal. No CVA tenderness. Cardio: regular rate and rhythm, S1, S2 normal, no murmur, click, rub or gallop GI: soft, non-tender; bowel sounds normal; no masses,  no organomegaly Extremities: extremities normal, atraumatic, no cyanosis or edema Neurologic: Alert and oriented X 3, normal strength and tone. Normal symmetric reflexes. Normal coordination and gait  ECOG PERFORMANCE STATUS: 1 - Symptomatic but completely ambulatory  Blood pressure 117/67, pulse 67, temperature 97.8 F (36.6 C), temperature source Tympanic, resp. rate 18, height 5\' 2"  (1.575 m), weight 124 lb 4.8 oz (56.4 kg), SpO2 98 %.  LABORATORY DATA: Lab Results  Component Value Date   WBC 4.7 08/09/2020   HGB 11.4 (L) 08/09/2020   HCT 34.1 (L) 08/09/2020   MCV 98.3 08/09/2020   PLT 197 08/09/2020      Chemistry      Component Value Date/Time   NA 141 08/09/2020 0859   NA 135 10/29/2017 1131   K 4.3 08/09/2020 0859   CL 107 08/09/2020 0859   CO2 28 08/09/2020 0859   BUN 12 08/09/2020 0859   BUN 5 (L) 10/29/2017 1131   CREATININE 0.64 08/09/2020 0859      Component Value Date/Time   CALCIUM 9.6 08/09/2020 0859   ALKPHOS 82 08/09/2020 0859   AST 28 08/09/2020 0859   ALT 33 08/09/2020 0859   BILITOT 0.4 08/09/2020 0859       RADIOGRAPHIC STUDIES: CT Chest W Contrast  Result Date: 08/14/2020 CLINICAL DATA:  Restaging right upper lobe non-small cell lung cancer EXAM: CT CHEST WITH CONTRAST TECHNIQUE: Multidetector CT imaging of the chest was performed during intravenous contrast administration. CONTRAST:  34mL OMNIPAQUE IOHEXOL 300 MG/ML  SOLN COMPARISON:  PET-CT 04/06/2020 FINDINGS: Cardiovascular: The heart size appears within normal limits. No pericardial effusion identified. Aortic atherosclerosis.  Coronary artery calcifications. Mediastinum/Nodes: Normal appearance of the thyroid gland. The trachea appears patent and is midline. Normal appearance of the esophagus. No enlarged axillary or supraclavicular lymph nodes. Higher right paratracheal lymph node measures 0.5 cm, image 46/2. Previously 0.8 cm. Lower right paratracheal lymph node measures 0.8 mm, image 52/2. Previously 1.1 cm. Lungs/Pleura: Index nodule within the right upper lobe anteriorly measures 0.9 x 0.8 by 0.6 cm (volume = 0.2 cm^3), image 43/7. Previously 1.0 x 0.9 by 0.9 cm (volume = 0.4 cm^3). No new lung nodules. Moderate centrilobular emphysema. No pleural effusion, airspace consolidation or atelectasis. Upper Abdomen: No acute abnormality. Musculoskeletal: No chest wall abnormality. No acute or significant osseous findings. IMPRESSION: 1. Approximate 50% decreased in total lesion volume of anterior right upper lobe index nodule. 2. Decrease in size of previous FDG avid right paratracheal lymph nodes 3. No new sites of disease 4. Emphysema and aortic atherosclerosis. 5. Coronary artery calcifications. Aortic Atherosclerosis (ICD10-I70.0) and Emphysema (ICD10-J43.9). Electronically Signed   By: [name] M.D.   On: 08/14/2020 09:59    ASSESSMENT AND PLAN: This is a very pleasant 69 years old white female recently diagnosed with a stage IIIa (T1b, N2, M0) non-small cell lung cancer, adenocarcinoma presented with right upper lobe lung nodule in addition to mediastinal lymphadenopathy. The patient had MRI of the brain performed recently that showed no evidence of intracranial metastatic disease. The patient underwent a course of neoadjuvant treatment with  carboplatin for AUC of 5, Alimta 500 mg/M2 and Keytruda 200 mg IV every 3 weeks for 3 cycles.   She tolerated this treatment well with no concerning adverse effect except for dry skin and itching. She had repeat CT scan of the chest performed recently.  I personally and independently  reviewed the scan images and discussed the results with the patient and her husband. Her scan showed partial response with around 50% reduction in the tumor volume. I recommended for the patient to see Dr. Roxan Hockey for evaluation and consideration of surgical resection. If the patient is still not a good surgical candidate for resection, will consider her for a course of concurrent chemoradiation. She was advised to call immediately if she has any other concerning symptoms in the interval. The patient voices understanding of current disease status and treatment options and is in agreement with the current care plan.  All questions were answered. The patient knows to call the clinic with any problems, questions or concerns. We can certainly see the patient much sooner if necessary.   Disclaimer: This note was dictated with voice recognition software. Similar sounding words can inadvertently be transcribed and may not be corrected upon review.

## 2020-08-29 ENCOUNTER — Other Ambulatory Visit: Payer: Self-pay

## 2020-08-29 ENCOUNTER — Ambulatory Visit: Payer: Medicare Other | Admitting: Thoracic Surgery (Cardiothoracic Vascular Surgery)

## 2020-08-29 ENCOUNTER — Encounter: Payer: Self-pay | Admitting: Thoracic Surgery (Cardiothoracic Vascular Surgery)

## 2020-08-29 VITALS — BP 115/69 | HR 72 | Temp 97.9°F | Resp 18 | Ht 62.5 in | Wt 124.4 lb

## 2020-08-29 DIAGNOSIS — R911 Solitary pulmonary nodule: Secondary | ICD-10-CM

## 2020-08-29 NOTE — Progress Notes (Signed)
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  [name] is a 69 year old woman with a history of tobacco abuse and mild COPD.  She has a 50-pack-year history of smoking.  She had a CT for lung cancer screening recently which showed a spiculated right upper lobe lung nodule.  She was referred to Dr. Melvyn Novas.  A PET/CT showed the nodule was hypermetabolic and there was a hypermetabolic right paratracheal lymph node.  I did a navigational bronchoscopy and endobronchial ultrasound on 05/08/2020.  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  She was seen in consultation by oncology and radiation oncology.  Dr. Julien Nordmann treated her with neoadjuvant chemotherapy with carboplatin and Alimta and Keytruda for 3 cycles.  She had a follow-up CT which showed a partial response and is now referred for consideration for surgical resection.  She tolerated the chemotherapy well.  She says she is down to smoking 1 or 2 cigarettes a day.  She is not having any chest pain, pressure, tightness, or shortness of breath.  She did not have any significant weight loss with chemotherapy.  Past Medical History:  Diagnosis Date   Cataract    COPD (chronic obstructive pulmonary disease) ([place])    Past Surgical History:  Procedure Laterality Date   APPENDECTOMY  2008   COLON SURGERY  2008   colostomy-infection post op appendectomy   COLOSTOMY REVERSAL  2009   VIDEO BRONCHOSCOPY WITH ENDOBRONCHIAL NAVIGATION N/A 05/08/2020   Procedure: VIDEO BRONCHOSCOPY WITH ENDOBRONCHIAL NAVIGATION;  Surgeon: [name], MD;  Location: [facility];  Service: Thoracic;  Laterality: N/A;   VIDEO BRONCHOSCOPY WITH ENDOBRONCHIAL ULTRASOUND N/A 05/08/2020   Procedure: VIDEO BRONCHOSCOPY WITH ENDOBRONCHIAL ULTRASOUND;  Surgeon:  [name], MD;  Location: [facility];  Service: Thoracic;  Laterality: N/A;    Current Outpatient Medications  Medication Sig Dispense Refill   albuterol (PROVENTIL HFA;VENTOLIN HFA) 108 (90 Base) MCG/ACT inhaler Inhale 2 puffs into the lungs every 4 (four) hours as needed for wheezing or shortness of breath (cough, shortness of breath or wheezing.). 1 Inhaler 1   Calcium Carbonate (CALCIUM 600 PO) Take 600 mg by mouth 2 (two) times daily.      Cholecalciferol (VITAMIN D3 PO) Take 2,000 Units by mouth daily.     fluticasone (FLONASE) 50 MCG/ACT nasal spray Place 2 sprays into both nostrils daily. 16 g 12   Fluticasone-Salmeterol (ADVAIR) 250-50 MCG/DOSE AEPB Inhale 1 puff into the lungs 2 (two) times daily.      Multiple Vitamin (MULTIVITAMIN) tablet Take 1 tablet by mouth daily.     prochlorperazine (COMPAZINE) 10 MG tablet Take 1 tablet (10 mg total) by mouth every 6 (six) hours as needed. 30 tablet 2   No current facility-administered medications for this visit.   Social History   Socioeconomic History   Marital status: Married    Spouse name: Not on file   Number of children: Not on file   Years of education: Not on file   Highest education level: Not on file  Occupational History   Not on file  Tobacco Use   Smoking status: Former Smoker    Packs/day: 0.05    Types: Cigarettes   Smokeless tobacco: Never  Used  Vaping Use   Vaping Use: Never used  Substance and Sexual Activity   Alcohol use: Yes    Alcohol/week: 10.0 standard drinks    Types: 10 Cans of beer per week   Drug use: No   Sexual activity: Not on file  Other Topics Concern   Not on file  Social History Narrative   Not on file   Social Determinants of Health   Financial Resource Strain: Not on file  Food Insecurity: Not on file  Transportation Needs: Not on file  Physical Activity: Not on file  Stress: Not on file  Social Connections: Not on file  Intimate Partner Violence:  Not on file    Physical Exam BP 115/69 (BP Location: Right Arm, Patient Position: Sitting, Cuff Size: Normal)    Pulse 72    Temp 97.9 F (36.6 C) (Skin)    Resp 18    Ht 5' 2.5" (1.588 m)    Wt 124 lb 6.4 oz (56.4 kg)    SpO2 95% Comment: RA   BMI 22.39 kg/m  Well-appearing 69 year old woman in no acute distress Alert and oriented x3 with no focal deficits No cervical or supraclavicular adenopathy Cardiac regular rate and rhythm, normal S1 and S2 Lungs clear with equal breath sounds bilaterally No clubbing cyanosis or edema  Diagnostic Tests: CT CHEST WITH CONTRAST  TECHNIQUE: Multidetector CT imaging of the chest was performed during intravenous contrast administration.  CONTRAST:  52mL OMNIPAQUE IOHEXOL 300 MG/ML  SOLN  COMPARISON:  PET-CT 04/06/2020  FINDINGS: Cardiovascular: The heart size appears within normal limits. No pericardial effusion identified. Aortic atherosclerosis. Coronary artery calcifications.  Mediastinum/Nodes: Normal appearance of the thyroid gland. The trachea appears patent and is midline. Normal appearance of the esophagus. No enlarged axillary or supraclavicular lymph nodes. Higher right paratracheal lymph node measures 0.5 cm, image 46/2. Previously 0.8 cm. Lower right paratracheal lymph node measures 0.8 mm, image 52/2. Previously 1.1 cm.  Lungs/Pleura: Index nodule within the right upper lobe anteriorly measures 0.9 x 0.8 by 0.6 cm (volume = 0.2 cm^3), image 43/7. Previously 1.0 x 0.9 by 0.9 cm (volume = 0.4 cm^3). No new lung nodules.  Moderate centrilobular emphysema. No pleural effusion, airspace consolidation or atelectasis.  Upper Abdomen: No acute abnormality.  Musculoskeletal: No chest wall abnormality. No acute or significant osseous findings.  IMPRESSION: 1. Approximate 50% decreased in total lesion volume of anterior right upper lobe index nodule. 2. Decrease in size of previous FDG avid right paratracheal  lymph nodes 3. No new sites of disease 4. Emphysema and aortic atherosclerosis. 5. Coronary artery calcifications.  Aortic Atherosclerosis (ICD10-I70.0) and Emphysema (ICD10-J43.9).   Electronically Signed   By: [name] M.D.   On: 08/14/2020 09:59  I personally reviewed the CT images and compared with her PET/CT and previous CT chest.  I concur with the findings noted above.  Impression: Torryn Fiske is a 69 year old woman with a history of tobacco abuse, COPD who was found to have a right upper lobe lung nodule and right paratracheal adenopathy low-dose screening CT done earlier this year.  On PET CT both areas were hypermetabolic.  Bronchoscopy and endobronchial ultrasound findings were consistent with stage IIIa adenocarcinoma.  She did have single nodal station IIIa disease.  She was treated with neoadjuvant chemotherapy and Keytruda.  Follow-up CT shows significant partial response.  I discussed with Mrs. Dung and her husband the treatment options at this point they understand the traditional standard therapy for stage IIIa disease  is chemoradiation.  They understand there is a select group of patients with stage IIIa disease that may benefit from surgical resection.  She would fall into that group with nodal disease limited to a single station that is resectable.  They understand there is no guarantee of a cure with surgery, but likely an improved chance of a long-term survival.  I described the proposed operation to them.  The plan would be for a robotic assisted right upper lobectomy and lymph node dissection.  The general nature of the procedure including the need for general anesthesia, the incisions to be used, the use of a drainage tube postoperatively, the expected hospital stay, and the overall recovery were discussed with the patient and her husband.  I informed him of the indications, risks, benefits, and alternatives.  They understand the risks include, but not  limited to death, MI, DVT, PE, bleeding, possible need for transfusion, possible need for conversion to thoracotomy, prolonged air leak, cardiac arrhythmias, as well as the possibility of other unforeseeable complications.  She wishes to think over her options and discuss further with her husband before making a decision as to how to proceed.  She does need pulmonary function testing with and without bronchodilators if she decides to proceed.  Plan: Pulmonary function testing Patient will call if she decides to proceed with surgical resection.  [name], MD [facility] [phone]

## 2020-09-25 ENCOUNTER — Other Ambulatory Visit: Payer: Self-pay | Admitting: *Deleted

## 2020-09-25 DIAGNOSIS — C3411 Malignant neoplasm of upper lobe, right bronchus or lung: Secondary | ICD-10-CM

## 2020-09-26 ENCOUNTER — Encounter: Payer: Self-pay | Admitting: *Deleted

## 2020-10-09 NOTE — Progress Notes (Signed)
..  The following Assist/Replace Program for [facility] from [place] has been terminated due to patient completed treatment 07/20/2020.  Last DOS:07/20/2020.

## 2020-10-09 NOTE — Progress Notes (Signed)
..  The following Assist/Replace Program for [facility] from [facility] has been terminated due to patient completed treatment on 07/20/2020.  Last DOS:07/20/2020.

## 2020-10-16 NOTE — Pre-Procedure Instructions (Signed)
[facility] [place], [address] [place] [address] Phone: [phone] Fax: [phone]      Your procedure is scheduled on Thursday, February 3rd.  Report to [facility] Main Entrance "A" at 6:00 A.M., and check in at the Admitting office.  Call this number if you have problems the morning of surgery:  [phone]  Call [phone] if you have any questions prior to your surgery date Monday-Friday 8am-4pm    Remember:  Do not eat or drink after midnight the night before your surgery   Take these medicines the morning of surgery with A SIP OF WATER  prochlorperazine (COMPAZINE)-as needed fluticasone (FLONASE) Albuterol Inhaler-please bring with you to the hospital Fluticasone-Salmeterol (ADVAIR)   As of today, STOP taking any Aspirin (unless otherwise instructed by your surgeon) Aleve, Naproxen, Ibuprofen, Motrin, Advil, Goody's, BC's, all herbal medications, fish oil, and all vitamins.             Do not wear jewelry, make up, or nail polish            Do not wear lotions, powders, perfumes, or deodorant.            Do not shave 48 hours prior to surgery.              Do not bring valuables to the hospital.            [facility] is not responsible for any belongings or valuables.  Do NOT Smoke (Tobacco/Vaping) or drink Alcohol 24 hours prior to your procedure If you use a CPAP at night, you may bring all equipment for your overnight stay.   Contacts, glasses, dentures or bridgework may not be worn into surgery.      For patients admitted to the hospital, discharge time will be determined by your treatment team.   Patients discharged the day of surgery will not be allowed to drive home, and someone needs to stay with them for 24 hours.    Special instructions:   [facility]- Preparing For Surgery  Before surgery, you can play an important role. Because skin is not sterile, your  skin needs to be as free of germs as possible. You can reduce the number of germs on your skin by washing with CHG (chlorahexidine gluconate) Soap before surgery.  CHG is an antiseptic cleaner which kills germs and bonds with the skin to continue killing germs even after washing.    Oral Hygiene is also important to reduce your risk of infection.  Remember - BRUSH YOUR TEETH THE MORNING OF SURGERY WITH YOUR REGULAR TOOTHPASTE  Please do not use if you have an allergy to CHG or antibacterial soaps. If your skin becomes reddened/irritated stop using the CHG.  Do not shave (including legs and underarms) for at least 48 hours prior to first CHG shower. It is OK to shave your face.  Please follow these instructions carefully.   1. Shower the NIGHT BEFORE SURGERY and the MORNING OF SURGERY with CHG Soap.   2. If you chose to wash your hair, wash your hair first as usual with your normal shampoo.  3. After you shampoo, rinse your hair and body thoroughly to remove the shampoo.  4. Use CHG as you would any other liquid soap. You can apply CHG directly to the skin and wash gently with a scrungie or a clean washcloth.   5. Apply  the CHG Soap to your body ONLY FROM THE NECK DOWN.  Do not use on open wounds or open sores. Avoid contact with your eyes, ears, mouth and genitals (private parts). Wash Face and genitals (private parts)  with your normal soap.   6. Wash thoroughly, paying special attention to the area where your surgery will be performed.  7. Thoroughly rinse your body with warm water from the neck down.  8. DO NOT shower/wash with your normal soap after using and rinsing off the CHG Soap.  9. Pat yourself dry with a CLEAN TOWEL.  10. Wear CLEAN PAJAMAS to bed the night before surgery  11. Place CLEAN SHEETS on your bed the night of your first shower and DO NOT SLEEP WITH PETS.   Day of Surgery: Wear Clean/Comfortable clothing the morning of surgery Do not apply any  deodorants/lotions.   Remember to brush your teeth WITH YOUR REGULAR TOOTHPASTE.   Please read over the following fact sheets that you were given.

## 2020-10-17 ENCOUNTER — Other Ambulatory Visit: Payer: Self-pay

## 2020-10-17 ENCOUNTER — Ambulatory Visit (HOSPITAL_COMMUNITY)
Admission: RE | Admit: 2020-10-17 | Discharge: 2020-10-17 | Disposition: A | Discharge status: Home / Self Care | Payer: Medicare Other | Source: Ambulatory Visit | Attending: Thoracic Surgery (Cardiothoracic Vascular Surgery) | Admitting: Thoracic Surgery (Cardiothoracic Vascular Surgery)

## 2020-10-17 ENCOUNTER — Encounter (HOSPITAL_COMMUNITY): Payer: Self-pay

## 2020-10-17 ENCOUNTER — Encounter (HOSPITAL_COMMUNITY)
Admission: RE | Admit: 2020-10-17 | Discharge: 2020-10-17 | Disposition: A | Discharge status: Home / Self Care | Payer: Medicare Other | Source: Ambulatory Visit | Attending: Thoracic Surgery (Cardiothoracic Vascular Surgery) | Admitting: Thoracic Surgery (Cardiothoracic Vascular Surgery)

## 2020-10-17 ENCOUNTER — Other Ambulatory Visit (HOSPITAL_COMMUNITY)
Admission: RE | Admit: 2020-10-17 | Discharge: 2020-10-17 | Disposition: A | Discharge status: Home / Self Care | Payer: Medicare Other | Source: Ambulatory Visit | Attending: Thoracic Surgery (Cardiothoracic Vascular Surgery) | Admitting: Thoracic Surgery (Cardiothoracic Vascular Surgery)

## 2020-10-17 DIAGNOSIS — Z01812 Encounter for preprocedural laboratory examination: Secondary | ICD-10-CM | POA: Insufficient documentation

## 2020-10-17 DIAGNOSIS — Z01818 Encounter for other preprocedural examination: Secondary | ICD-10-CM

## 2020-10-17 DIAGNOSIS — C3411 Malignant neoplasm of upper lobe, right bronchus or lung: Secondary | ICD-10-CM | POA: Insufficient documentation

## 2020-10-17 DIAGNOSIS — Z20822 Contact with and (suspected) exposure to covid-19: Secondary | ICD-10-CM | POA: Insufficient documentation

## 2020-10-17 LAB — SURGICAL PCR SCREEN
MRSA, PCR: NEGATIVE
Staphylococcus aureus: NEGATIVE

## 2020-10-17 LAB — URINALYSIS, ROUTINE W REFLEX MICROSCOPIC
Bilirubin Urine: NEGATIVE
Glucose, UA: NEGATIVE mg/dL
Hgb urine dipstick: NEGATIVE
Ketones, ur: NEGATIVE mg/dL
Leukocytes,Ua: NEGATIVE
Nitrite: NEGATIVE
Protein, ur: NEGATIVE mg/dL
Specific Gravity, Urine: 1.017 (ref 1.005–1.030)
pH: 5 (ref 5.0–8.0)

## 2020-10-17 LAB — CBC
HCT: 38.7 % (ref 36.0–46.0)
Hemoglobin: 13 g/dL (ref 12.0–15.0)
MCH: 32.4 pg (ref 26.0–34.0)
MCHC: 33.6 g/dL (ref 30.0–36.0)
MCV: 96.5 fL (ref 80.0–100.0)
Platelets: 268 10*3/uL (ref 150–400)
RBC: 4.01 MIL/uL (ref 3.87–5.11)
RDW: 12 % (ref 11.5–15.5)
WBC: 6.9 10*3/uL (ref 4.0–10.5)
nRBC: 0 % (ref 0.0–0.2)

## 2020-10-17 LAB — COMPREHENSIVE METABOLIC PANEL
ALT: 26 U/L (ref 0–44)
AST: 35 U/L (ref 15–41)
Albumin: 3.7 g/dL (ref 3.5–5.0)
Alkaline Phosphatase: 87 U/L (ref 38–126)
Anion gap: 14 (ref 5–15)
BUN: 9 mg/dL (ref 8–23)
CO2: 21 mmol/L — ABNORMAL LOW (ref 22–32)
Calcium: 9.4 mg/dL (ref 8.9–10.3)
Chloride: 102 mmol/L (ref 98–111)
Creatinine, Ser: 0.65 mg/dL (ref 0.44–1.00)
GFR, Estimated: >60 mL/min (ref >60)
Glucose, Bld: 97 mg/dL (ref 70–99)
Potassium: 4.1 mmol/L (ref 3.5–5.1)
Sodium: 137 mmol/L (ref 135–145)
Total Bilirubin: 0.6 mg/dL (ref 0.3–1.2)
Total Protein: 6.8 g/dL (ref 6.5–8.1)

## 2020-10-17 LAB — BLOOD GAS, ARTERIAL
Acid-Base Excess: 1.9 mmol/L (ref 0.0–2.0)
Bicarbonate: 25.8 mmol/L (ref 20.0–28.0)
Drawn by: 602861
FIO2: 21
O2 Saturation: 95.7 %
Patient temperature: 37
pCO2 arterial: 39.5 mmHg (ref 32.0–48.0)
pH, Arterial: 7.431 (ref 7.350–7.450)
pO2, Arterial: 77.2 mmHg — ABNORMAL LOW (ref 83.0–108.0)

## 2020-10-17 LAB — APTT: aPTT: 34 seconds (ref 24–36)

## 2020-10-17 LAB — PROTIME-INR
INR: 0.9 (ref 0.8–1.2)
Prothrombin Time: 11.9 seconds (ref 11.4–15.2)

## 2020-10-17 LAB — SARS CORONAVIRUS 2 (TAT 6-24 HRS): SARS Coronavirus 2: NEGATIVE

## 2020-10-17 IMAGING — CR DG CHEST 2V
2 series · 2 of 2 positions shown · non-contrast
Comparison: [DATE], [DATE]

CLINICAL DATA: Lung cancer

EXAM:
CHEST - 2 VIEW

[w chest pa]
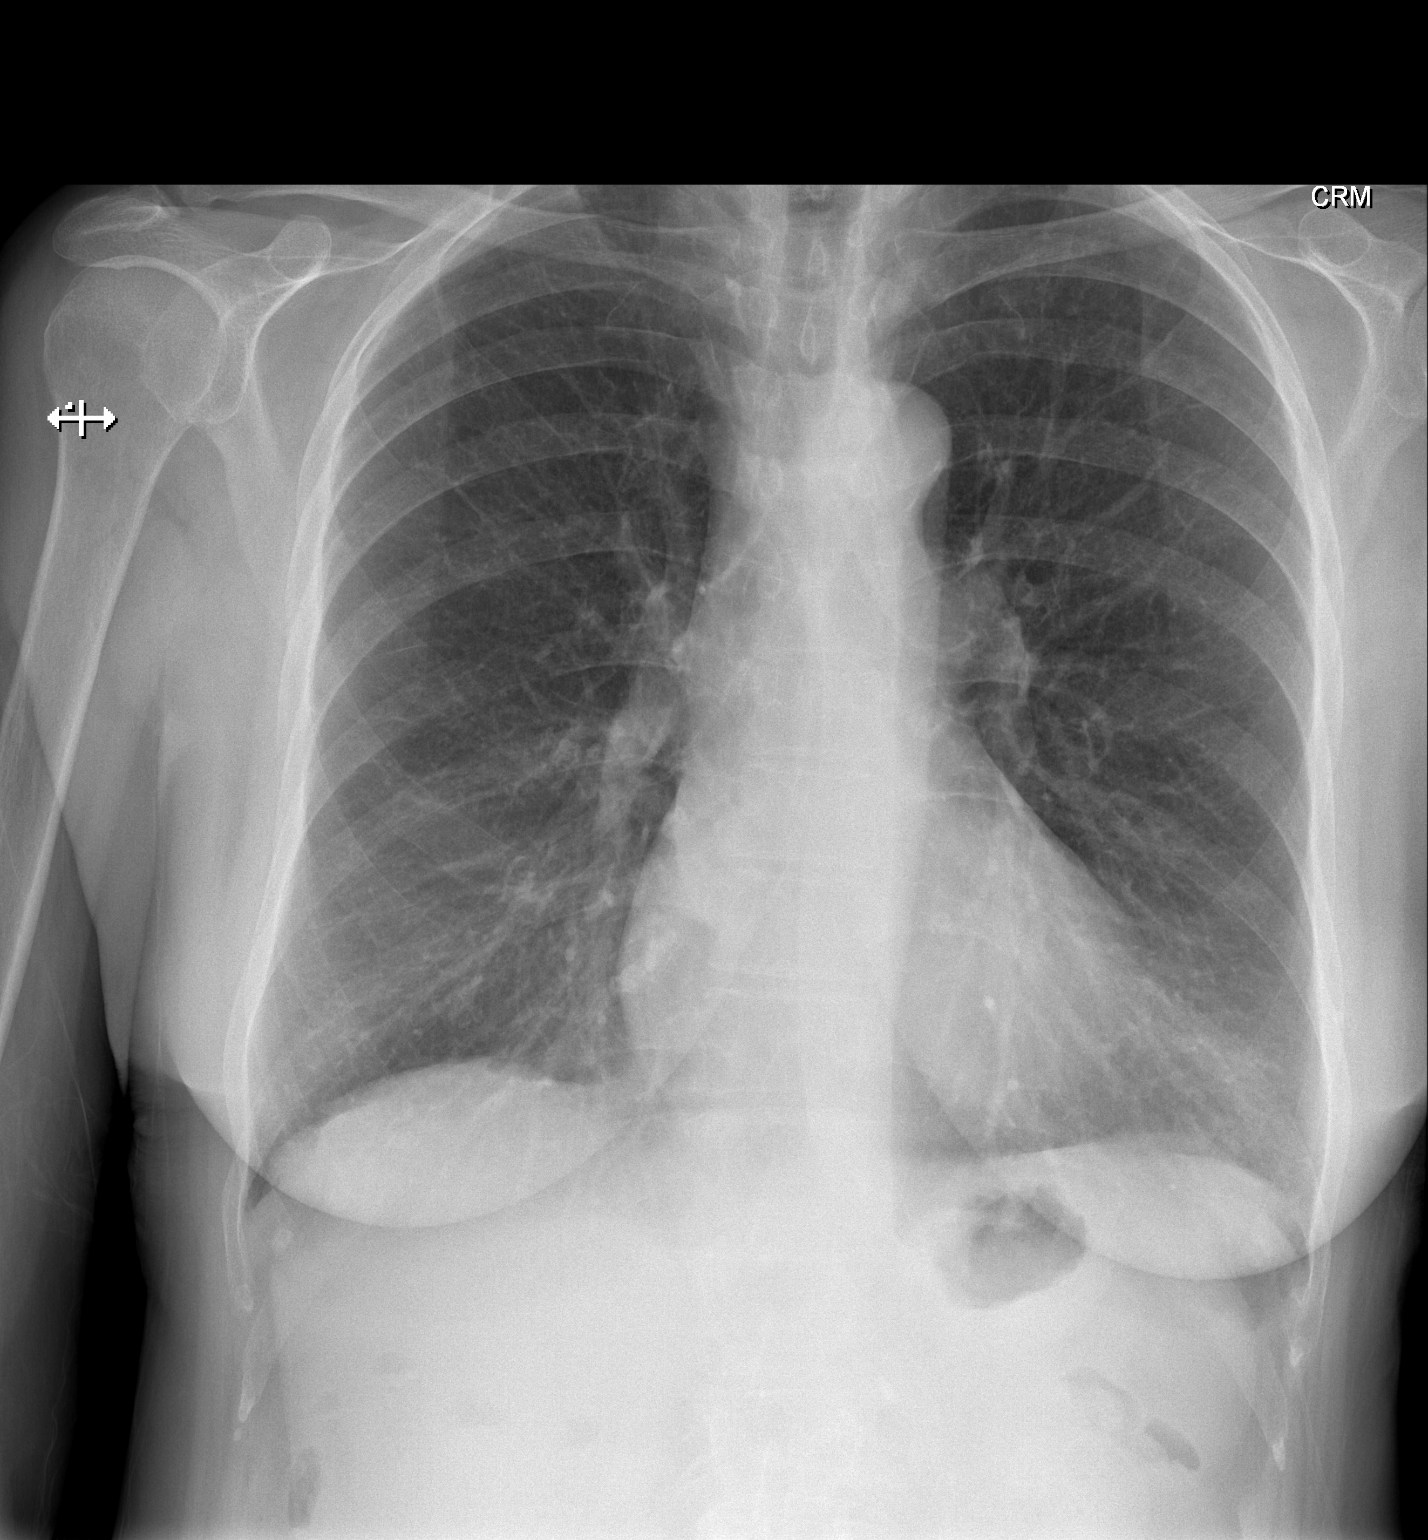

[w chest lat]
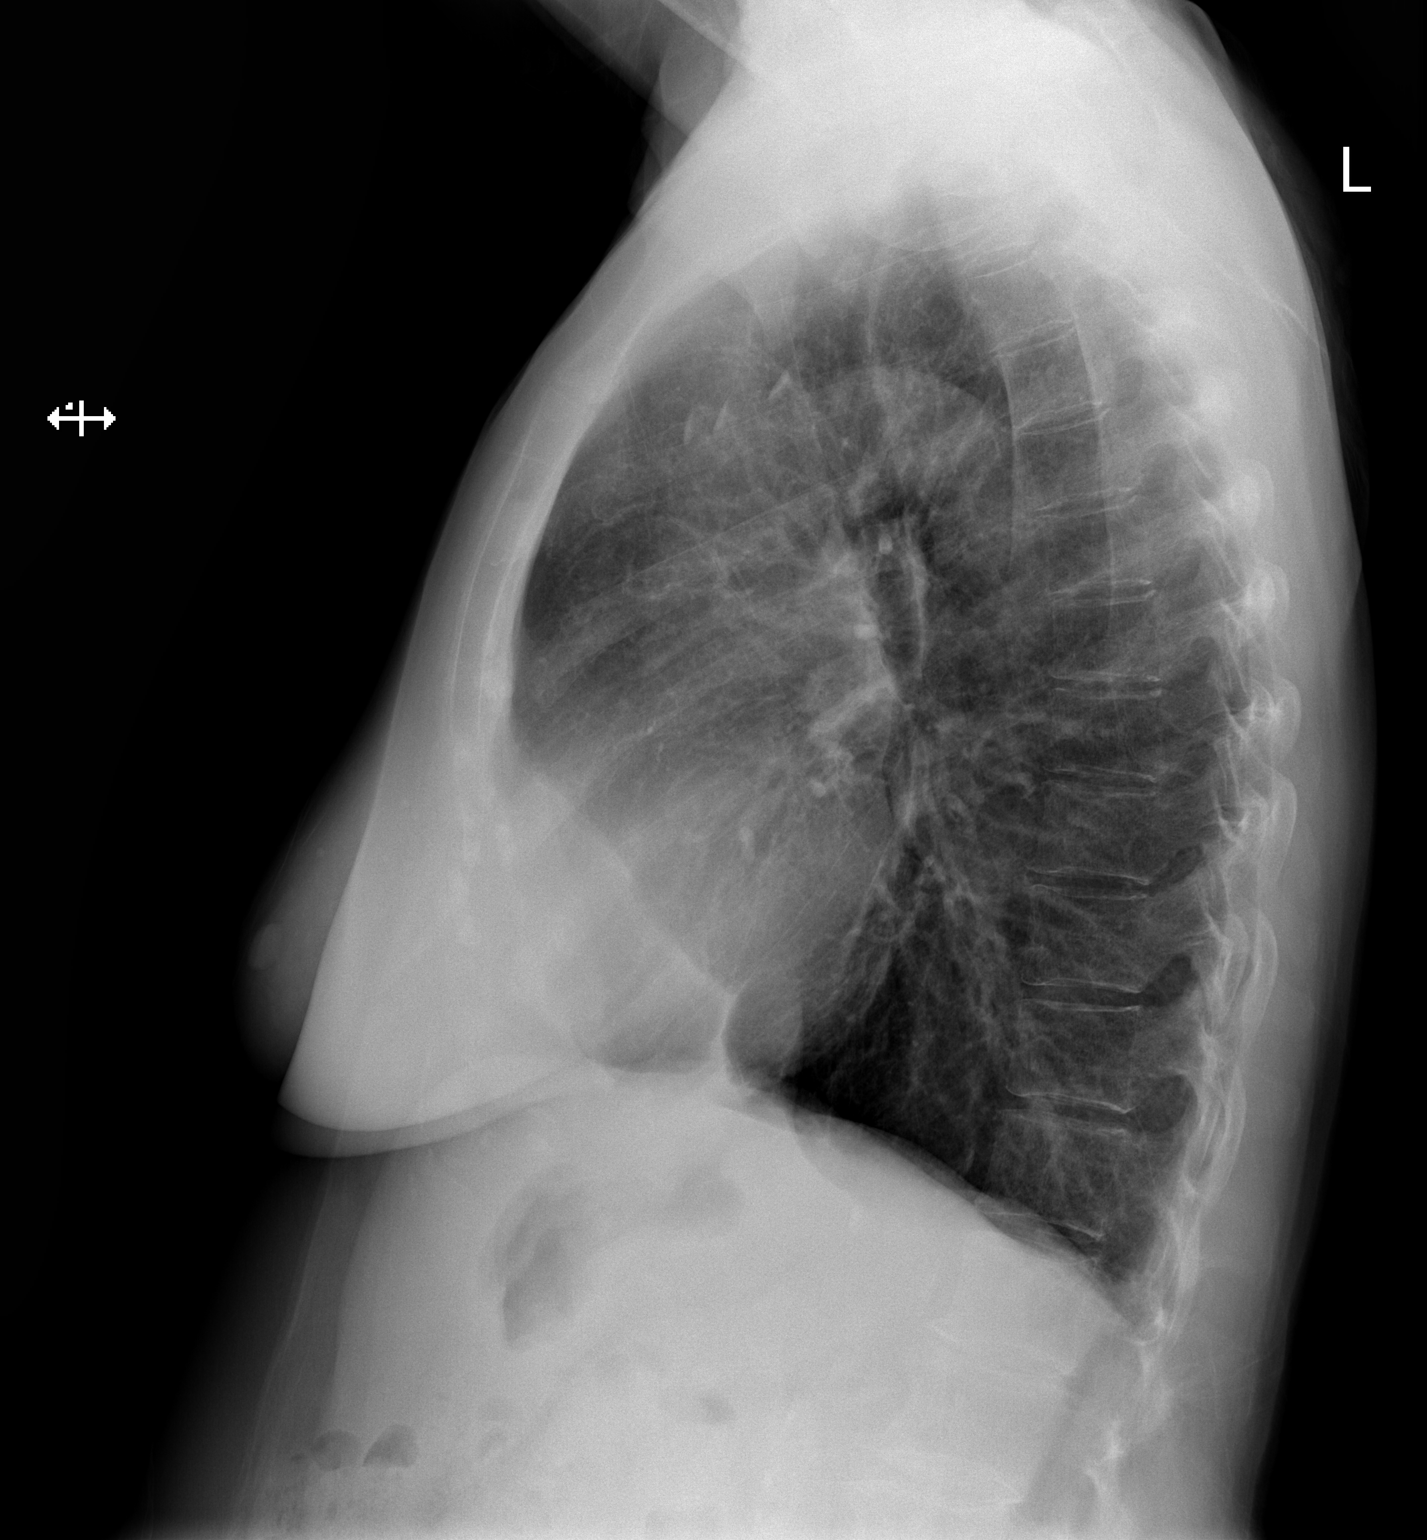

[2 of 2 positions shown; findings below may reference images not displayed]

FINDINGS: The heart size and mediastinal contours are within normal limits.
Known right upper lobe pulmonary nodule is not well seen
radiographically. No focal airspace consolidation, pleural effusion,
or pneumothorax. The visualized skeletal structures are
unremarkable.
IMPRESSION: No active cardiopulmonary disease.

## 2020-10-17 NOTE — Progress Notes (Signed)
PCP - CHELL JFFREY  WITH [facility] Cardiologist - NA     Chest x-ray - 10/17/20 EKG - 10/17/20 Stress Test - NA ECHO - NA Cardiac Cath - NA  Blood Thinner Instructions:NA Aspirin Instructions:STOP   OVID TEST- 10/17/20   Anesthesia review:  REVIEW EKG  Patient denies shortness of breath, fever, cough and chest pain at PAT appointment   All instructions explained to the patient, with a verbal understanding of the material. Patient agrees to go over the instructions while at home for a better understanding. Patient also instructed to self quarantine after being tested for COVID-19. The opportunity to ask questions was provided.

## 2020-10-18 ENCOUNTER — Ambulatory Visit (HOSPITAL_COMMUNITY)
Admission: RE | Admit: 2020-10-18 | Discharge: 2020-10-18 | Disposition: A | Discharge status: Home / Self Care | Payer: Medicare Other | Source: Ambulatory Visit | Attending: Thoracic Surgery (Cardiothoracic Vascular Surgery) | Admitting: Thoracic Surgery (Cardiothoracic Vascular Surgery)

## 2020-10-18 ENCOUNTER — Encounter (HOSPITAL_COMMUNITY): Payer: Self-pay | Admitting: Thoracic Surgery (Cardiothoracic Vascular Surgery)

## 2020-10-18 DIAGNOSIS — C3411 Malignant neoplasm of upper lobe, right bronchus or lung: Secondary | ICD-10-CM

## 2020-10-18 LAB — PULMONARY FUNCTION TEST
DL/VA % pred: 72 %
DL/VA: 3.03 ml/min/mmHg/L
DLCO cor % pred: 63 %
DLCO cor: 11.74 ml/min/mmHg
DLCO unc % pred: 62 %
DLCO unc: 11.59 ml/min/mmHg
FEF 25-75 Post: 0.72 L/sec
FEF 25-75 Pre: 0.65 L/sec
FEF2575-%Change-Post: 10 %
FEF2575-%Pred-Post: 39 %
FEF2575-%Pred-Pre: 35 %
FEV1-%Change-Post: 1 %
FEV1-%Pred-Post: 67 %
FEV1-%Pred-Pre: 66 %
FEV1-Post: 1.43 L
FEV1-Pre: 1.41 L
FEV1FVC-%Change-Post: -2 %
FEV1FVC-%Pred-Pre: 80 %
FEV6-%Change-Post: 3 %
FEV6-%Pred-Post: 84 %
FEV6-%Pred-Pre: 81 %
FEV6-Post: 2.27 L
FEV6-Pre: 2.2 L
FEV6FVC-%Change-Post: 0 %
FEV6FVC-%Pred-Post: 99 %
FEV6FVC-%Pred-Pre: 99 %
FVC-%Change-Post: 4 %
FVC-%Pred-Post: 85 %
FVC-%Pred-Pre: 82 %
FVC-Post: 2.42 L
FVC-Pre: 2.31 L
Post FEV1/FVC ratio: 59 %
Post FEV6/FVC ratio: 95 %
Pre FEV1/FVC ratio: 61 %
Pre FEV6/FVC Ratio: 95 %
RV % pred: 159 %
RV: 3.38 L
TLC % pred: 116 %
TLC: 5.61 L

## 2020-10-18 MED ORDER — ALBUTEROL SULFATE (2.5 MG/3ML) 0.083% IN NEBU
2.5000 mg | INHALATION_SOLUTION | Freq: Once | RESPIRATORY_TRACT | Status: AC
  Administered 2020-10-18: 2.5 mg via RESPIRATORY_TRACT

## 2020-10-19 ENCOUNTER — Encounter (HOSPITAL_COMMUNITY)
Admission: RE | Disposition: A | Discharge status: Home / Self Care | Payer: Self-pay | Source: Home / Self Care | Attending: Thoracic Surgery (Cardiothoracic Vascular Surgery)

## 2020-10-19 ENCOUNTER — Inpatient Hospital Stay (HOSPITAL_COMMUNITY): Payer: Medicare Other

## 2020-10-19 ENCOUNTER — Inpatient Hospital Stay (HOSPITAL_COMMUNITY)
Admission: RE | Admit: 2020-10-19 | Discharge: 2020-10-28 | DRG: 164 | Disposition: A | Discharge status: Home Health | Payer: Medicare Other | Attending: Thoracic Surgery (Cardiothoracic Vascular Surgery) | Admitting: Thoracic Surgery (Cardiothoracic Vascular Surgery)

## 2020-10-19 ENCOUNTER — Inpatient Hospital Stay (HOSPITAL_COMMUNITY): Payer: Medicare Other | Admitting: Physician Assistant

## 2020-10-19 ENCOUNTER — Other Ambulatory Visit: Payer: Self-pay

## 2020-10-19 ENCOUNTER — Inpatient Hospital Stay (HOSPITAL_COMMUNITY): Payer: Medicare Other | Admitting: Certified Registered Nurse Anesthetist

## 2020-10-19 ENCOUNTER — Encounter (HOSPITAL_COMMUNITY): Payer: Self-pay | Admitting: Thoracic Surgery (Cardiothoracic Vascular Surgery)

## 2020-10-19 DIAGNOSIS — Z9221 Personal history of antineoplastic chemotherapy: Secondary | ICD-10-CM | POA: Diagnosis not present

## 2020-10-19 DIAGNOSIS — I700 Atherosclerosis of aorta: Secondary | ICD-10-CM | POA: Diagnosis present

## 2020-10-19 DIAGNOSIS — I2510 Atherosclerotic heart disease of native coronary artery without angina pectoris: Secondary | ICD-10-CM | POA: Diagnosis present

## 2020-10-19 DIAGNOSIS — Z20822 Contact with and (suspected) exposure to covid-19: Secondary | ICD-10-CM | POA: Diagnosis present

## 2020-10-19 DIAGNOSIS — D62 Acute posthemorrhagic anemia: Secondary | ICD-10-CM | POA: Diagnosis not present

## 2020-10-19 DIAGNOSIS — R918 Other nonspecific abnormal finding of lung field: Secondary | ICD-10-CM | POA: Diagnosis present

## 2020-10-19 DIAGNOSIS — J432 Centrilobular emphysema: Secondary | ICD-10-CM | POA: Diagnosis present

## 2020-10-19 DIAGNOSIS — Z79899 Other long term (current) drug therapy: Secondary | ICD-10-CM | POA: Diagnosis not present

## 2020-10-19 DIAGNOSIS — C3412 Malignant neoplasm of upper lobe, left bronchus or lung: Secondary | ICD-10-CM

## 2020-10-19 DIAGNOSIS — T797XXA Traumatic subcutaneous emphysema, initial encounter: Secondary | ICD-10-CM | POA: Diagnosis not present

## 2020-10-19 DIAGNOSIS — C3411 Malignant neoplasm of upper lobe, right bronchus or lung: Secondary | ICD-10-CM | POA: Diagnosis present

## 2020-10-19 DIAGNOSIS — F17210 Nicotine dependence, cigarettes, uncomplicated: Secondary | ICD-10-CM | POA: Diagnosis present

## 2020-10-19 DIAGNOSIS — Z902 Acquired absence of lung [part of]: Secondary | ICD-10-CM

## 2020-10-19 DIAGNOSIS — J9382 Other air leak: Secondary | ICD-10-CM

## 2020-10-19 DIAGNOSIS — R001 Bradycardia, unspecified: Secondary | ICD-10-CM | POA: Diagnosis not present

## 2020-10-19 DIAGNOSIS — Z09 Encounter for follow-up examination after completed treatment for conditions other than malignant neoplasm: Secondary | ICD-10-CM

## 2020-10-19 DIAGNOSIS — J939 Pneumothorax, unspecified: Secondary | ICD-10-CM

## 2020-10-19 HISTORY — PX: INTERCOSTAL NERVE BLOCK: SHX5021

## 2020-10-19 HISTORY — PX: NODE DISSECTION: SHX5269

## 2020-10-19 LAB — POCT I-STAT 7, (LYTES, BLD GAS, ICA,H+H)
Acid-Base Excess: 1 mmol/L (ref 0.0–2.0)
Acid-Base Excess: 1 mmol/L (ref 0.0–2.0)
Bicarbonate: 26.5 mmol/L (ref 20.0–28.0)
Bicarbonate: 26.6 mmol/L (ref 20.0–28.0)
Calcium, Ion: 1.19 mmol/L (ref 1.15–1.40)
Calcium, Ion: 1.16 mmol/L (ref 1.15–1.40)
HCT: 34 % — ABNORMAL LOW (ref 36.0–46.0)
HCT: 35 % — ABNORMAL LOW (ref 36.0–46.0)
Hemoglobin: 11.6 g/dL — ABNORMAL LOW (ref 12.0–15.0)
Hemoglobin: 11.9 g/dL — ABNORMAL LOW (ref 12.0–15.0)
O2 Saturation: 90 %
O2 Saturation: 98 %
Patient temperature: 36
Potassium: 3.5 mmol/L (ref 3.5–5.1)
Potassium: 3.8 mmol/L (ref 3.5–5.1)
Sodium: 138 mmol/L (ref 135–145)
Sodium: 138 mmol/L (ref 135–145)
TCO2: 28 mmol/L (ref 22–32)
TCO2: 28 mmol/L (ref 22–32)
pCO2 arterial: 44.9 mmHg (ref 32.0–48.0)
pCO2 arterial: 43.4 mmHg (ref 32.0–48.0)
pH, Arterial: 7.378 (ref 7.350–7.450)
pH, Arterial: 7.391 (ref 7.350–7.450)
pO2, Arterial: 61 mmHg — ABNORMAL LOW (ref 83.0–108.0)
pO2, Arterial: 109 mmHg — ABNORMAL HIGH (ref 83.0–108.0)

## 2020-10-19 LAB — PREPARE RBC (CROSSMATCH)

## 2020-10-19 IMAGING — DX DG CHEST 1V PORT
1 series · 1 of 1 positions shown · non-contrast
Comparison: [DATE]

CLINICAL DATA: Postoperative right upper lobectomy

EXAM:
PORTABLE CHEST 1 VIEW

[chest ap]
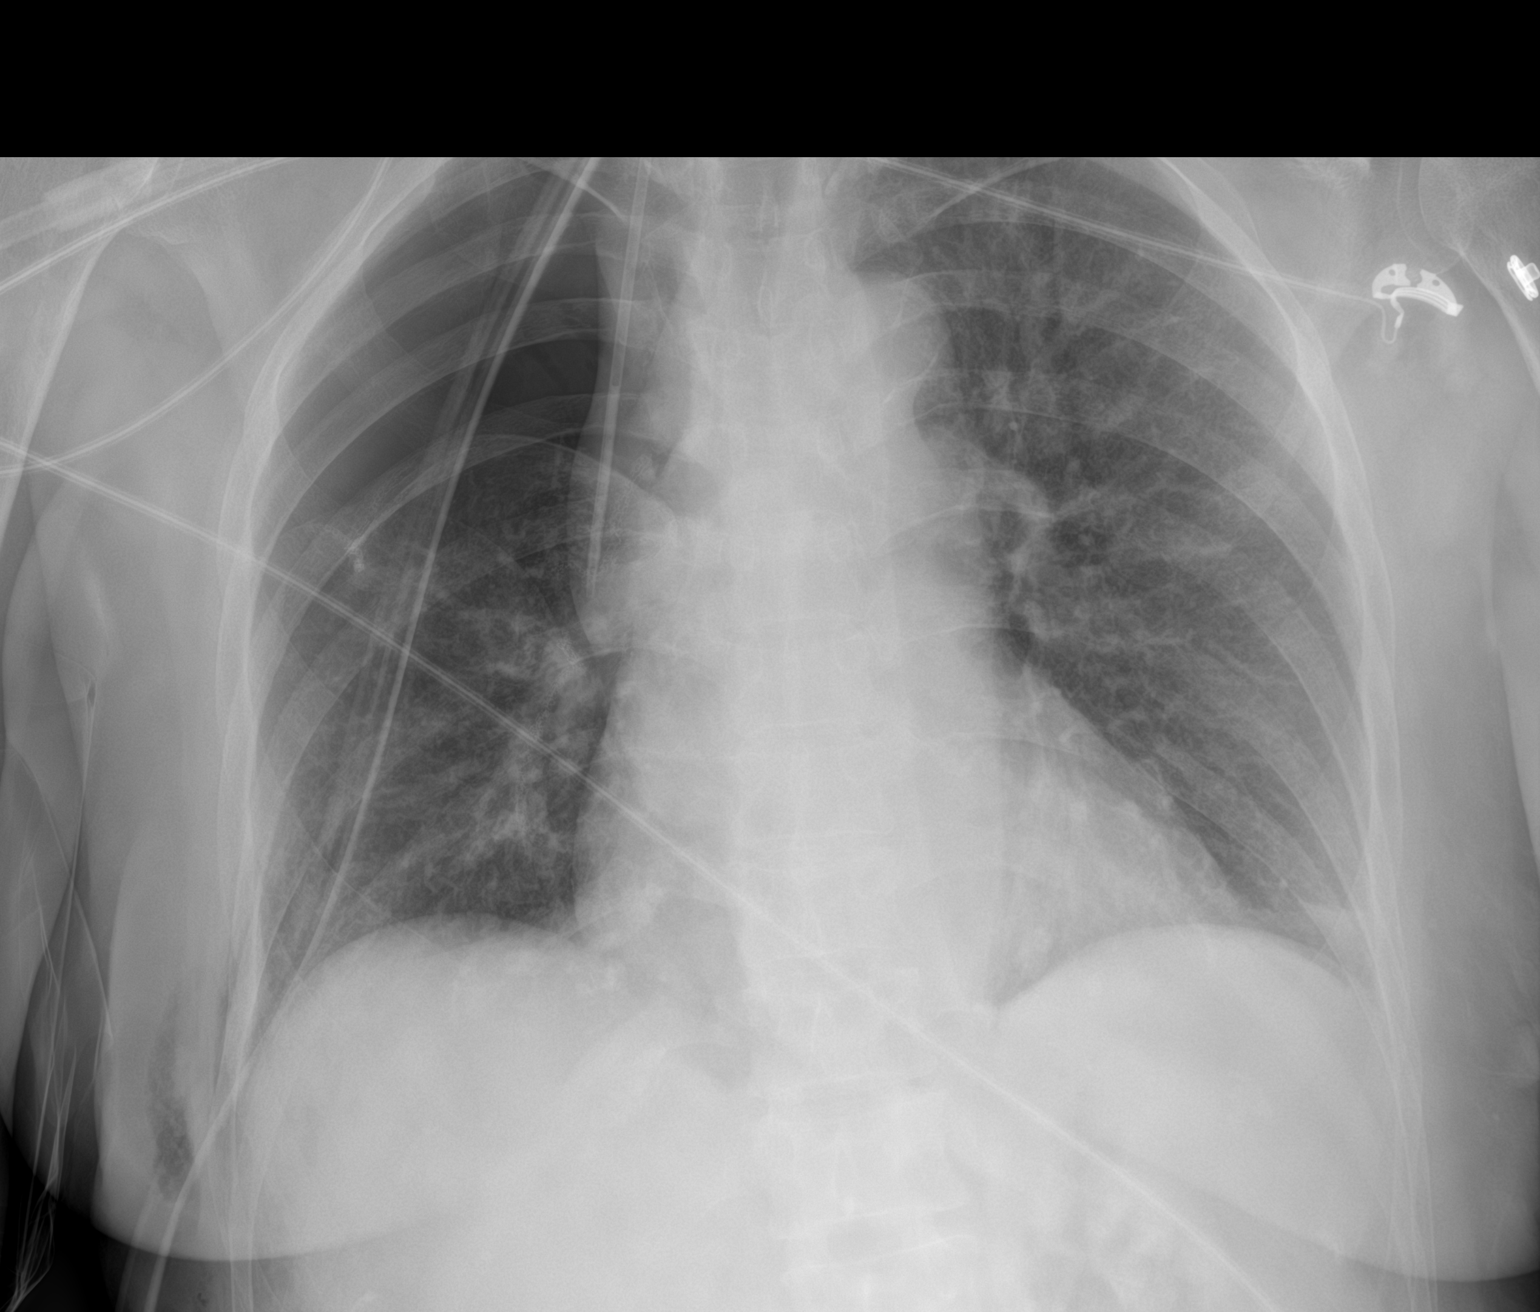

[1 of 1 positions shown; findings below may reference images not displayed]

FINDINGS: Normal heart size. No shift of the heart or mediastinal structures.
Large right-sided pneumothorax, approximately 50% volume. A right
chest tube is in place with distal tip terminating near the right
lung apex. Interval right IJ approach central venous catheter with
distal tip terminating at the level of the distal SVC. Left lung is
clear. No left-sided pneumothorax.
IMPRESSION: 1. Large right-sided pneumothorax, approximately 50% volume. No
mediastinal shift. Right-sided chest tube is in place.
2. Right IJ approach central venous catheter tip terminates at the
level of the distal SVC.

## 2020-10-19 SURGERY — LOBECTOMY, LUNG, ROBOT-ASSISTED, USING VATS
Anesthesia: General | Site: Chest | Laterality: Right

## 2020-10-19 MED ORDER — 0.9 % SODIUM CHLORIDE (POUR BTL) OPTIME
TOPICAL | Status: DC | PRN
  Administered 2020-10-19: 2000 mL

## 2020-10-19 MED ORDER — ADULT MULTIVITAMIN W/MINERALS CH
1.0000 | ORAL_TABLET | Freq: Every day | ORAL | Status: DC
  Administered 2020-10-20 – 2020-10-28 (×9): 1 via ORAL
  Filled 2020-10-19 (×9): qty 1

## 2020-10-19 MED ORDER — CHLORHEXIDINE GLUCONATE 0.12 % MT SOLN
OROMUCOSAL | Status: AC
  Administered 2020-10-19: 15 mL via OROMUCOSAL
  Filled 2020-10-19: qty 15

## 2020-10-19 MED ORDER — BUPIVACAINE LIPOSOME 1.3 % IJ SUSP
20.0000 mL | Freq: Once | INTRAMUSCULAR | Status: DC
  Filled 2020-10-19: qty 20

## 2020-10-19 MED ORDER — ORAL CARE MOUTH RINSE: 15.0000 mL | Freq: Once | OROMUCOSAL | Status: AC

## 2020-10-19 MED ORDER — LACTATED RINGERS IV SOLN: INTRAVENOUS | Status: DC

## 2020-10-19 MED ORDER — SUGAMMADEX SODIUM 200 MG/2ML IV SOLN
INTRAVENOUS | Status: DC | PRN
  Administered 2020-10-19: 200 mg via INTRAVENOUS

## 2020-10-19 MED ORDER — FENTANYL CITRATE (PF) 250 MCG/5ML IJ SOLN
INTRAMUSCULAR | Status: AC
  Filled 2020-10-19: qty 5

## 2020-10-19 MED ORDER — ENOXAPARIN SODIUM 40 MG/0.4ML ~~LOC~~ SOLN
40.0000 mg | SUBCUTANEOUS | Status: DC
  Administered 2020-10-19 – 2020-10-27 (×8): 40 mg via SUBCUTANEOUS
  Filled 2020-10-19 (×8): qty 0.4

## 2020-10-19 MED ORDER — ONDANSETRON HCL 4 MG/2ML IJ SOLN
INTRAMUSCULAR | Status: AC
  Filled 2020-10-19: qty 2

## 2020-10-19 MED ORDER — DEXAMETHASONE SODIUM PHOSPHATE 4 MG/ML IJ SOLN
INTRAMUSCULAR | Status: DC | PRN
  Administered 2020-10-19: 10 mg via INTRAVENOUS

## 2020-10-19 MED ORDER — SODIUM CHLORIDE FLUSH 0.9 % IV SOLN
INTRAVENOUS | Status: DC | PRN
  Administered 2020-10-19: 90 mL

## 2020-10-19 MED ORDER — FLUTICASONE PROPIONATE 50 MCG/ACT NA SUSP
2.0000 | Freq: Every day | NASAL | Status: DC
  Administered 2020-10-21 – 2020-10-28 (×9): 2 via NASAL
  Filled 2020-10-19 (×2): qty 16

## 2020-10-19 MED ORDER — ONDANSETRON HCL 4 MG/2ML IJ SOLN: 4.0000 mg | Freq: Once | INTRAMUSCULAR | Status: DC | PRN

## 2020-10-19 MED ORDER — ACETAMINOPHEN 500 MG PO TABS
1000.0000 mg | ORAL_TABLET | Freq: Four times a day (QID) | ORAL | Status: AC
  Administered 2020-10-19 – 2020-10-24 (×18): 1000 mg via ORAL
  Filled 2020-10-19 (×18): qty 2

## 2020-10-19 MED ORDER — HEMOSTATIC AGENTS (NO CHARGE) OPTIME
TOPICAL | Status: DC | PRN
  Administered 2020-10-19: 2 via TOPICAL

## 2020-10-19 MED ORDER — ACETAMINOPHEN 160 MG/5ML PO SOLN
1000.0000 mg | Freq: Four times a day (QID) | ORAL | Status: AC
  Filled 2020-10-19: qty 40.6

## 2020-10-19 MED ORDER — ONDANSETRON HCL 4 MG/2ML IJ SOLN
INTRAMUSCULAR | Status: DC | PRN
  Administered 2020-10-19: 4 mg via INTRAVENOUS

## 2020-10-19 MED ORDER — TRAMADOL HCL 50 MG PO TABS
50.0000 mg | ORAL_TABLET | Freq: Four times a day (QID) | ORAL | Status: DC | PRN
  Administered 2020-10-19 – 2020-10-27 (×6): 50 mg via ORAL
  Filled 2020-10-19 (×6): qty 1

## 2020-10-19 MED ORDER — PROPOFOL 10 MG/ML IV BOLUS
INTRAVENOUS | Status: AC
  Filled 2020-10-19: qty 40

## 2020-10-19 MED ORDER — FENTANYL CITRATE (PF) 100 MCG/2ML IJ SOLN
25.0000 ug | INTRAMUSCULAR | Status: DC | PRN
  Administered 2020-10-19: 25 ug via INTRAVENOUS

## 2020-10-19 MED ORDER — BISACODYL 5 MG PO TBEC
10.0000 mg | DELAYED_RELEASE_TABLET | Freq: Every day | ORAL | Status: DC
  Administered 2020-10-20 – 2020-10-28 (×8): 10 mg via ORAL
  Filled 2020-10-19 (×8): qty 2

## 2020-10-19 MED ORDER — PROPOFOL 10 MG/ML IV BOLUS
INTRAVENOUS | Status: DC | PRN
  Administered 2020-10-19: 50 mg via INTRAVENOUS
  Administered 2020-10-19: 100 mg via INTRAVENOUS

## 2020-10-19 MED ORDER — CHLORHEXIDINE GLUCONATE CLOTH 2 % EX PADS
6.0000 | MEDICATED_PAD | Freq: Every day | CUTANEOUS | Status: DC
  Administered 2020-10-19 – 2020-10-21 (×2): 6 via TOPICAL

## 2020-10-19 MED ORDER — POTASSIUM CHLORIDE IN NACL 20-0.9 MEQ/L-% IV SOLN
INTRAVENOUS | Status: DC
  Filled 2020-10-19 (×5): qty 1000

## 2020-10-19 MED ORDER — KETOROLAC TROMETHAMINE 15 MG/ML IJ SOLN
15.0000 mg | Freq: Four times a day (QID) | INTRAMUSCULAR | Status: AC
  Administered 2020-10-19 – 2020-10-22 (×10): 15 mg via INTRAVENOUS
  Filled 2020-10-19 (×10): qty 1

## 2020-10-19 MED ORDER — SENNOSIDES-DOCUSATE SODIUM 8.6-50 MG PO TABS
1.0000 | ORAL_TABLET | Freq: Every day | ORAL | Status: DC
  Administered 2020-10-19 – 2020-10-27 (×5): 1 via ORAL
  Filled 2020-10-19 (×6): qty 1

## 2020-10-19 MED ORDER — CEFAZOLIN SODIUM-DEXTROSE 2-4 GM/100ML-% IV SOLN
2.0000 g | INTRAVENOUS | Status: AC
  Administered 2020-10-19: 2 g via INTRAVENOUS

## 2020-10-19 MED ORDER — CEFAZOLIN SODIUM-DEXTROSE 2-4 GM/100ML-% IV SOLN
INTRAVENOUS | Status: AC
  Administered 2020-10-19: 2 g via INTRAVENOUS
  Filled 2020-10-19: qty 100

## 2020-10-19 MED ORDER — ROCURONIUM BROMIDE 10 MG/ML (PF) SYRINGE
PREFILLED_SYRINGE | INTRAVENOUS | Status: DC | PRN
  Administered 2020-10-19: 70 mg via INTRAVENOUS
  Administered 2020-10-19 (×2): 30 mg via INTRAVENOUS

## 2020-10-19 MED ORDER — LACTATED RINGERS IV SOLN: INTRAVENOUS | Status: DC | PRN

## 2020-10-19 MED ORDER — ROCURONIUM BROMIDE 10 MG/ML (PF) SYRINGE
PREFILLED_SYRINGE | INTRAVENOUS | Status: AC
  Filled 2020-10-19: qty 10

## 2020-10-19 MED ORDER — OXYCODONE HCL 5 MG PO TABS
5.0000 mg | ORAL_TABLET | ORAL | Status: DC | PRN
  Administered 2020-10-19 – 2020-10-27 (×14): 5 mg via ORAL
  Filled 2020-10-19 (×14): qty 1

## 2020-10-19 MED ORDER — OXYCODONE HCL 5 MG/5ML PO SOLN: 5.0000 mg | Freq: Once | ORAL | Status: DC | PRN

## 2020-10-19 MED ORDER — LIDOCAINE 2% (20 MG/ML) 5 ML SYRINGE
INTRAMUSCULAR | Status: AC
  Filled 2020-10-19: qty 5

## 2020-10-19 MED ORDER — MIDAZOLAM HCL 5 MG/5ML IJ SOLN
INTRAMUSCULAR | Status: DC | PRN
  Administered 2020-10-19: 1 mg via INTRAVENOUS

## 2020-10-19 MED ORDER — OXYCODONE HCL 5 MG PO TABS: 5.0000 mg | ORAL_TABLET | Freq: Once | ORAL | Status: DC | PRN

## 2020-10-19 MED ORDER — MIDAZOLAM HCL 2 MG/2ML IJ SOLN
INTRAMUSCULAR | Status: AC
  Filled 2020-10-19: qty 2

## 2020-10-19 MED ORDER — FENTANYL CITRATE (PF) 100 MCG/2ML IJ SOLN
INTRAMUSCULAR | Status: DC | PRN
  Administered 2020-10-19: 50 ug via INTRAVENOUS
  Administered 2020-10-19: 100 ug via INTRAVENOUS
  Administered 2020-10-19 (×2): 50 ug via INTRAVENOUS

## 2020-10-19 MED ORDER — MOMETASONE FURO-FORMOTEROL FUM 200-5 MCG/ACT IN AERO
2.0000 | INHALATION_SPRAY | Freq: Two times a day (BID) | RESPIRATORY_TRACT | Status: DC
  Administered 2020-10-20 – 2020-10-28 (×16): 2 via RESPIRATORY_TRACT
  Filled 2020-10-19: qty 8.8

## 2020-10-19 MED ORDER — FENTANYL CITRATE (PF) 100 MCG/2ML IJ SOLN
INTRAMUSCULAR | Status: AC
  Filled 2020-10-19: qty 2

## 2020-10-19 MED ORDER — BUPIVACAINE HCL (PF) 0.5 % IJ SOLN
INTRAMUSCULAR | Status: AC
  Filled 2020-10-19: qty 30

## 2020-10-19 MED ORDER — LEVALBUTEROL HCL 0.63 MG/3ML IN NEBU: 0.6300 mg | INHALATION_SOLUTION | Freq: Three times a day (TID) | RESPIRATORY_TRACT | Status: DC | PRN

## 2020-10-19 MED ORDER — SODIUM CHLORIDE 0.9% IV SOLUTION: Freq: Once | INTRAVENOUS | Status: DC

## 2020-10-19 MED ORDER — ONDANSETRON HCL 4 MG/2ML IJ SOLN: 4.0000 mg | Freq: Four times a day (QID) | INTRAMUSCULAR | Status: DC | PRN

## 2020-10-19 MED ORDER — CEFAZOLIN SODIUM-DEXTROSE 2-4 GM/100ML-% IV SOLN
2.0000 g | Freq: Three times a day (TID) | INTRAVENOUS | Status: AC
  Administered 2020-10-19: 2 g via INTRAVENOUS
  Filled 2020-10-19 (×3): qty 100

## 2020-10-19 MED ORDER — SODIUM CHLORIDE 0.9% IV SOLUTION
INTRAVENOUS | Status: AC | PRN
  Administered 2020-10-19: 1000 mL via INTRAMUSCULAR

## 2020-10-19 MED ORDER — CHLORHEXIDINE GLUCONATE 0.12 % MT SOLN: 15.0000 mL | Freq: Once | OROMUCOSAL | Status: AC

## 2020-10-19 MED ORDER — PHENYLEPHRINE HCL-NACL 10-0.9 MG/250ML-% IV SOLN
INTRAVENOUS | Status: DC | PRN
  Administered 2020-10-19: 30 ug/min via INTRAVENOUS

## 2020-10-19 MED ORDER — ALBUMIN HUMAN 5 % IV SOLN
12.5000 g | Freq: Once | INTRAVENOUS | Status: AC
  Administered 2020-10-19: 12.5 g via INTRAVENOUS
  Filled 2020-10-19: qty 250

## 2020-10-19 MED ORDER — PHENYLEPHRINE HCL-NACL 10-0.9 MG/250ML-% IV SOLN
INTRAVENOUS | Status: AC
  Filled 2020-10-19: qty 500

## 2020-10-19 MED ORDER — LIDOCAINE 2% (20 MG/ML) 5 ML SYRINGE
INTRAMUSCULAR | Status: DC | PRN
  Administered 2020-10-19: 40 mg via INTRAVENOUS

## 2020-10-19 MED ORDER — DEXAMETHASONE SODIUM PHOSPHATE 10 MG/ML IJ SOLN
INTRAMUSCULAR | Status: AC
  Filled 2020-10-19: qty 1

## 2020-10-19 SURGICAL SUPPLY — 109 items
BLADE CLIPPER SURG (BLADE) IMPLANT
CANISTER SUCT 3000ML PPV (MISCELLANEOUS) ×4 IMPLANT
CANNULA REDUC XI 12-8 STAPL (CANNULA) ×4
CANNULA REDUCER 12-8 DVNC XI (CANNULA) ×2 IMPLANT
CATH THORACIC 28FR (CATHETERS) IMPLANT
CATH THORACIC 28FR RT ANG (CATHETERS) IMPLANT
CATH THORACIC 36FR (CATHETERS) IMPLANT
CATH THORACIC 36FR RT ANG (CATHETERS) IMPLANT
CLIP TI WIDE RED SMALL 6 (CLIP) ×2 IMPLANT
CLIP VESOCCLUDE MED 6/CT (CLIP) IMPLANT
CNTNR URN SCR LID CUP LEK RST (MISCELLANEOUS) ×16 IMPLANT
CONN ST 1/4X3/8  BEN (MISCELLANEOUS) ×2
CONN ST 1/4X3/8 BEN (MISCELLANEOUS) ×1 IMPLANT
CONN Y 3/8X3/8X3/8  BEN (MISCELLANEOUS)
CONN Y 3/8X3/8X3/8 BEN (MISCELLANEOUS) IMPLANT
CONT SPEC 4OZ STRL OR WHT (MISCELLANEOUS) ×32
DEFOGGER SCOPE WARMER CLEARIFY (MISCELLANEOUS) ×2 IMPLANT
DERMABOND ADVANCED (GAUZE/BANDAGES/DRESSINGS) ×1
DERMABOND ADVANCED .7 DNX12 (GAUZE/BANDAGES/DRESSINGS) ×1 IMPLANT
DRAIN CHANNEL 28F RND 3/8 FF (WOUND CARE) ×2 IMPLANT
DRAIN CHANNEL 32F RND 10.7 FF (WOUND CARE) IMPLANT
DRAPE ARM DVNC X/XI (DISPOSABLE) ×4 IMPLANT
DRAPE COLUMN DVNC XI (DISPOSABLE) ×1 IMPLANT
DRAPE CV SPLIT W-CLR ANES SCRN (DRAPES) ×2 IMPLANT
DRAPE DA VINCI XI ARM (DISPOSABLE) ×8
DRAPE DA VINCI XI COLUMN (DISPOSABLE) ×2
DRAPE INCISE IOBAN 66X45 STRL (DRAPES) IMPLANT
DRAPE ORTHO SPLIT 77X108 STRL (DRAPES) ×2
DRAPE SURG ORHT 6 SPLT 77X108 (DRAPES) ×1 IMPLANT
ELECT BLADE 6.5 EXT (BLADE) ×2 IMPLANT
ELECT REM PT RETURN 9FT ADLT (ELECTROSURGICAL) ×2
ELECTRODE REM PT RTRN 9FT ADLT (ELECTROSURGICAL) ×1 IMPLANT
GAUZE KITTNER 4X5 RF (MISCELLANEOUS) ×8 IMPLANT
GAUZE SPONGE 4X4 12PLY STRL (GAUZE/BANDAGES/DRESSINGS) ×2 IMPLANT
GLOVE SURG SS PI 8.0 STRL IVOR (GLOVE) ×2 IMPLANT
GLOVE SURG SYN 7.5  E (GLOVE)
GLOVE SURG SYN 7.5 E (GLOVE) IMPLANT
GLOVE TRIUMPH SURG SIZE 7.5 (KITS) ×2 IMPLANT
GOWN STRL REUS W/ TWL LRG LVL3 (GOWN DISPOSABLE) ×3 IMPLANT
GOWN STRL REUS W/ TWL XL LVL3 (GOWN DISPOSABLE) ×2 IMPLANT
GOWN STRL REUS W/TWL 2XL LVL3 (GOWN DISPOSABLE) ×2 IMPLANT
GOWN STRL REUS W/TWL LRG LVL3 (GOWN DISPOSABLE) ×6
GOWN STRL REUS W/TWL XL LVL3 (GOWN DISPOSABLE) ×4
HEMOSTAT SURGICEL 2X14 (HEMOSTASIS) ×4 IMPLANT
IRRIGATION STRYKERFLOW (MISCELLANEOUS) ×1 IMPLANT
IRRIGATOR STRYKERFLOW (MISCELLANEOUS) ×2
KIT BASIN OR (CUSTOM PROCEDURE TRAY) ×2 IMPLANT
KIT SUCTION CATH 14FR (SUCTIONS) IMPLANT
KIT TURNOVER KIT B (KITS) ×2 IMPLANT
LOOP VESSEL SUPERMAXI WHITE (MISCELLANEOUS) IMPLANT
NEEDLE HYPO 25GX1X1/2 BEV (NEEDLE) ×2 IMPLANT
NEEDLE SPNL 22GX3.5 QUINCKE BK (NEEDLE) ×2 IMPLANT
NS IRRIG 1000ML POUR BTL (IV SOLUTION) ×4 IMPLANT
PACK CHEST (CUSTOM PROCEDURE TRAY) ×2 IMPLANT
PAD ARMBOARD 7.5X6 YLW CONV (MISCELLANEOUS) ×4 IMPLANT
PORT ACCESS TROCAR AIRSEAL 12 (TROCAR) ×1 IMPLANT
PORT ACCESS TROCAR AIRSEAL 5M (TROCAR) ×1
RELOAD STAPLER 2.5X45 WHT DVNC (STAPLE) ×5 IMPLANT
RELOAD STAPLER 3.5X45 BLU DVNC (STAPLE) ×7 IMPLANT
RELOAD STAPLER 4.3X45 GRN DVNC (STAPLE) ×1 IMPLANT
SCISSORS LAP 5X35 DISP (ENDOMECHANICALS) IMPLANT
SEAL CANN UNIV 5-8 DVNC XI (MISCELLANEOUS) ×2 IMPLANT
SEAL XI 5MM-8MM UNIVERSAL (MISCELLANEOUS) ×4
SEALANT PROGEL (MISCELLANEOUS) IMPLANT
SEALANT SURG COSEAL 4ML (VASCULAR PRODUCTS) IMPLANT
SEALANT SURG COSEAL 8ML (VASCULAR PRODUCTS) IMPLANT
SET TRI-LUMEN FLTR TB AIRSEAL (TUBING) ×2 IMPLANT
SHEARS HARMONIC HDI 20CM (ELECTROSURGICAL) IMPLANT
SOLUTION ELECTROLUBE (MISCELLANEOUS) ×2 IMPLANT
SPONGE INTESTINAL PEANUT (DISPOSABLE) IMPLANT
SPONGE TONSIL TAPE 1 RFD (DISPOSABLE) IMPLANT
STAPLER 45 SUREFORM CVD (STAPLE) ×2
STAPLER 45 SUREFORM CVD DVNC (STAPLE) ×1 IMPLANT
STAPLER CANNULA SEAL DVNC XI (STAPLE) ×2 IMPLANT
STAPLER CANNULA SEAL XI (STAPLE) ×4
STAPLER RELOAD 2.5X45 WHITE (STAPLE) ×10
STAPLER RELOAD 2.5X45 WHT DVNC (STAPLE) ×5
STAPLER RELOAD 3.5X45 BLU DVNC (STAPLE) ×7
STAPLER RELOAD 3.5X45 BLUE (STAPLE) ×14
STAPLER RELOAD 4.3X45 GREEN (STAPLE) ×2
STAPLER RELOAD 4.3X45 GRN DVNC (STAPLE) ×1
SUT PDS AB 3-0 SH 27 (SUTURE) IMPLANT
SUT PROLENE 4 0 RB 1 (SUTURE)
SUT PROLENE 4-0 RB1 .5 CRCL 36 (SUTURE) IMPLANT
SUT SILK  1 MH (SUTURE) ×2
SUT SILK 1 MH (SUTURE) ×1 IMPLANT
SUT SILK 1 TIES 10X30 (SUTURE) ×2 IMPLANT
SUT SILK 2 0 SH (SUTURE) ×2 IMPLANT
SUT SILK 2 0SH CR/8 30 (SUTURE) IMPLANT
SUT SILK 3 0SH CR/8 30 (SUTURE) IMPLANT
SUT VIC AB 0 CT1 27 (SUTURE) ×2
SUT VIC AB 0 CT1 27XBRD ANBCTR (SUTURE) ×1 IMPLANT
SUT VIC AB 1 CTX 36 (SUTURE) ×2
SUT VIC AB 1 CTX36XBRD ANBCTR (SUTURE) ×1 IMPLANT
SUT VIC AB 2-0 CTX 36 (SUTURE) ×2 IMPLANT
SUT VIC AB 3-0 MH 27 (SUTURE) IMPLANT
SUT VIC AB 3-0 X1 27 (SUTURE) ×4 IMPLANT
SUT VICRYL 0 TIES 12 18 (SUTURE) ×2 IMPLANT
SUT VICRYL 0 UR6 27IN ABS (SUTURE) ×4 IMPLANT
SUT VICRYL 2 TP 1 (SUTURE) IMPLANT
SYR 20CC LL (SYRINGE) ×4 IMPLANT
SYSTEM RETRIEVAL ANCHOR 12 (MISCELLANEOUS) ×2 IMPLANT
SYSTEM SAHARA CHEST DRAIN ATS (WOUND CARE) ×2 IMPLANT
TAPE CLOTH 4X10 WHT NS (GAUZE/BANDAGES/DRESSINGS) ×2 IMPLANT
TAPE CLOTH SURG 4X10 WHT LF (GAUZE/BANDAGES/DRESSINGS) ×2 IMPLANT
TIP APPLICATOR SPRAY EXTEND 16 (VASCULAR PRODUCTS) IMPLANT
TOWEL GREEN STERILE (TOWEL DISPOSABLE) ×2 IMPLANT
TRAY FOLEY MTR SLVR 16FR STAT (SET/KITS/TRAYS/PACK) ×2 IMPLANT
WATER STERILE IRR 1000ML POUR (IV SOLUTION) ×2 IMPLANT

## 2020-10-19 NOTE — Anesthesia Preprocedure Evaluation (Signed)
Anesthesia Evaluation  Patient identified by MRN, date of birth, ID band Patient awake    Reviewed: Allergy & Precautions, NPO status , Patient's Chart, lab work & pertinent test results  Airway Mallampati: II  TM Distance: >3 FB Neck ROM: Full    Dental  (+) Edentulous Upper   Pulmonary Patient abstained from smoking., former smoker,     + decreased breath sounds      Cardiovascular  Rhythm:Regular Rate:Normal     Neuro/Psych    GI/Hepatic   Endo/Other    Renal/GU      Musculoskeletal   Abdominal   Peds  Hematology   Anesthesia Other Findings   Reproductive/Obstetrics                             Anesthesia Physical Anesthesia Plan  ASA: III  Anesthesia Plan: General   Post-op Pain Management:    Induction: Intravenous  PONV Risk Score and Plan: Ondansetron and Dexamethasone  Airway Management Planned: Double Lumen EBT  Additional Equipment: Arterial line  Intra-op Plan:   Post-operative Plan: Extubation in OR  Informed Consent: I have reviewed the patients History and Physical, chart, labs and discussed the procedure including the risks, benefits and alternatives for the proposed anesthesia with the patient or authorized representative who has indicated his/her understanding and acceptance.       Plan Discussed with: CRNA and Anesthesiologist  Anesthesia Plan Comments:         Anesthesia Quick Evaluation

## 2020-10-19 NOTE — Anesthesia Postprocedure Evaluation (Signed)
Anesthesia Post Note  Patient: [name]  Procedure(s) Performed: XI ROBOTIC ASSISTED THORASCOPY- RIGHT UPPER LOBECTOMY (Right Chest) INTERCOSTAL NERVE BLOCK (Right Chest) NODE DISSECTION (Right Chest)     Patient location during evaluation: PACU Anesthesia Type: General Level of consciousness: awake and alert Pain management: pain level controlled Vital Signs Assessment: post-procedure vital signs reviewed and stable Respiratory status: spontaneous breathing, nonlabored ventilation, respiratory function stable and patient connected to nasal cannula oxygen Cardiovascular status: blood pressure returned to baseline and stable Postop Assessment: no apparent nausea or vomiting Anesthetic complications: no   No complications documented.  Last Vitals:  Vitals:   10/19/20 1434 10/19/20 1456  BP: (!) 112/58 117/64  Pulse: 61   Resp: 12   Temp: 36.9 C (!) 36.3 C  SpO2: 100%     Last Pain:  Vitals:   10/19/20 1456  TempSrc: Oral  PainSc: 10-Worst pain ever                 Ximenna Fonseca COKER

## 2020-10-19 NOTE — Op Note (Signed)
NAME: Christina, Frederick MEDICAL RECORD XN:[id number] ACCOUNT [id number] DATE OF BIRTH:04-02-1951 FACILITY: [facility] LOCATION: [facility] PHYSICIAN:Darrien Belter Chaya Jan, MD  OPERATIVE REPORT  DATE OF PROCEDURE:  10/19/2020  PREOPERATIVE DIAGNOSIS:  Stage IIIA nonsmall cell carcinoma, right upper lobe, status post neoadjuvant chemotherapy.  POSTOPERATIVE DIAGNOSIS:  Stage IIIA nonsmall cell carcinoma, right upper lobe, status post neoadjuvant chemotherapy.  PROCEDURE:   Xi robotic-assisted right thoracoscopy, Right upper lobectomy, Lymph node dissection, and Intercostal nerve blocks levels 3 through 10.  SURGEON:  Modesto Charon, MD  ASSISTANT:  Lars Pinks, PA.  ANESTHESIA:  General.  FINDINGS:  Multiple enlarged nodes, incomplete fissures, bronchial margin negative for tumor.  CLINICAL NOTE:  [name] is a 70 year old woman with a history of tobacco abuse and COPD.  She had a CT for lung cancer screening, which showed a spiculated right upper lobe lung nodule.  On PET/CT, there was a hypermetabolic right paratracheal node.   Bronchoscopy and endobronchial ultrasound showed an adenocarcinoma, stage IIIA (T1, N2).  She was treated with neoadjuvant chemotherapy and immunotherapy.  Followup CT showed partial response and she was referred for surgical resection.  The  indications, risks, benefits, and alternatives were discussed in detail with the patient.  She understood and accepted the risks and agreed to proceed.  OPERATIVE NOTE:  [name] was brought to the preoperative holding area on 10/19/2020.  Anesthesia placed a central venous catheter and arterial blood pressure monitoring line.  She was taken to the operating room, anesthetized and intubated with a double  lumen endotracheal tube.  Intravenous antibiotics were administered.  A Foley catheter was placed.  Sequential compression devices were placed on the calves for DVT prophylaxis.  She was placed in a left lateral  decubitus position.  The right chest was  prepped and draped in the usual sterile fashion.  Single lung ventilation of the left lung was initiated and was tolerated well throughout the procedure.  A timeout was performed.  A solution containing 20 mL of liposomal bupivacaine, 30 mL of 0.5% bupivacaine and 50 mL of saline was prepared.  This was used for the intercostal nerve blocks as well as for local anesthesia at the incisions.  An incision was  made in the 8th interspace in approximately the mid axillary line and 8 mm port was inserted.  The thoracoscope was advanced into the chest.  There was good isolation of the right lung.  Carbon dioxide was insufflated per protocol.  A 12 mm port was  placed in the 8th interspace anterior to the camera port.  Intercostal nerve blocks then were performed from the 3rd to the 10th interspace.  A needle was inserted from a posterior approach and 10 mL of the bupivacaine solution was injected into a subpleural  plane at each level.  An 8 mm robotic port was placed in the 8th interspace posteriorly and then a 12 mm port was placed in the 8th interspace centered between the camera port and the posterior port.  Finally, a 12 mm AirSeal port was placed  posterolaterally in the 10th interspace.  The robotic instruments were inserted with thoracoscopic visualization.  Initial inspection revealed no significant pleural effusion.  There was no abnormality of the parietal pleura.  The lung was retracted superiorly and the inferior ligament was divided with bipolar cautery.  All lymph nodes that were encountered during  the dissection were removed and sent as separate specimens for permanent pathology.  The level 9 nodes were removed.  The pleural reflection was  divided at the hilum posteriorly.  Level 7 nodes were removed.  There were multiple level 7 nodes, some rather enlarged.  There was some bleeding with removal of these nodes and Surgicel was packed into the lymph node  bed.  A level 11 node was removed as well.  Moving superiorly, a level 10 node was removed and then the pleura overlying the mediastinum  superior to the azygos vein was incised and level 4R and 2R nodes were removed and an extensive node dissection was done in this area, cleaning out the entire space between the trachea and the superior vena cava extending from the azygos inferiorly to just  below the innominate superiorly.  There were multiple nodes in the specimen.   The 4R and 2R nodes were sent separately.  Dissection then was continued anteriorly.  The phrenic nerve was identified.  No cautery was used in the vicinity of the phrenic  nerve.  The superior pulmonary vein branches were dissected out.  The middle lobe vein was identified and preserved.  The remaining superior segmental branches were encircled and then divided with the robotic stapler using a vascular cartridge.  The  major fissure was inspected and there was no good plane to work in there.  Division of the minor fissure was begun.  This was done with sequential firings of the robotic stapler using both blue and green cartridges.  The main pulmonary artery was  identified by retracting upward on the stump of the left upper lobe superior pulmonary vein trunk.  The vessels was dissected apart.  There was a rather large node in that area, which was removed and sent separately.  The anterior and apical branches of the upper lobe  pulmonary artery were then encircled and divided with the vascular stapler.  Additional dissection was then done in the fissure and the posterior ascending branch was identified.  It was encircled and divided with the vascular stapler.  The fissure then  was completed between the superior segment and the upper lobe.  Stapler then was placed across the right upper lobe bronchus at its origin.  A green cartridge was used.  The stapler was closed.  A test inflation showed good aeration in the lower and  middle lobes.  The  stapler was fired, transecting the bronchus.  The majority of the sponges were removed as was the vessel loop.  The middle lobe was tacked to the lower lobe with the stapler to prevent torsion.  The upper lobe was placed into an endoscopic retrieval bag and  then brought down to the inferior aspect of the chest.  The robotic instruments were removed and the robot was undocked.  The anterior 8th interspace incision was enlarged to approximately 3 cm.  The upper lobe then was removed through that incision and sent for frozen section of the bronchial margin, which returned with no tumor seen.  The remaining sponges were removed.   The chest was copiously irrigated with warm saline.  A test inflation to 30 cm of water revealed no leakage from the bronchial stump.  There was some minimal leakage from the fissure line.  A 28-French Blake drain was placed through the original port incision and was directed to the apex.  It was secured with a #1 silk suture. Dual-lung ventilation was resumed.  The remaining incisions then were closed in standard fashion.  Dermabond was applied.  The chest tube was placed to a Pleur-evac on waterseal.  She  was placed back in  a supine position.  She was extubated in the operating room and taken to the [facility] in good condition.  All sponge needle and instrument counts were correct at the end of the procedure.  IN/NUANCE  D:10/19/2020 T:10/19/2020 JOB:[id number]

## 2020-10-19 NOTE — Anesthesia Procedure Notes (Addendum)
Procedure Name: Intubation Date/Time: 10/19/2020 8:24 AM Performed by: Candis Shine, CRNA Pre-anesthesia Checklist: Patient identified, Emergency Drugs available, Suction available and Patient being monitored Patient Re-evaluated:Patient Re-evaluated prior to induction Oxygen Delivery Method: Circle system utilized Preoxygenation: Pre-oxygenation with 100% oxygen Induction Type: IV induction Ventilation: Mask ventilation without difficulty and Oral airway inserted - appropriate to patient size Laryngoscope Size: Mac and 3 Grade View: Grade I Endobronchial tube: Left, Double lumen EBT, EBT position confirmed by fiberoptic bronchoscope and EBT position confirmed by auscultation and 37 Fr Number of attempts: 1 Airway Equipment and Method: Stylet and Oral airway Placement Confirmation: ETT inserted through vocal cords under direct vision,  positive ETCO2 and breath sounds checked- equal and bilateral Secured at: 28 cm Tube secured with: Tape Dental Injury: Teeth and Oropharynx as per pre-operative assessment  Comments: Atraumatic placement by Erick Colace, SRNA

## 2020-10-19 NOTE — Anesthesia Procedure Notes (Signed)
Central Venous Catheter Insertion Performed by: Roberts Gaudy, MD, anesthesiologist Start/End2/11/2020 7:10 AM, 10/19/2020 7:22 AM Patient location: Pre-op. Preanesthetic checklist: patient identified, IV checked, site marked, risks and benefits discussed, surgical consent, monitors and equipment checked, pre-op evaluation, timeout performed and anesthesia consent Lidocaine 1% used for infiltration and patient sedated Hand hygiene performed  and maximum sterile barriers used  Catheter size: 8 Fr Total catheter length 16. Central line was placed.Double lumen Procedure performed using ultrasound guided technique. Ultrasound Notes:image(s) printed for medical record Attempts: 1 Following insertion, dressing applied and line sutured. Post procedure assessment: blood return through all ports  Patient tolerated the procedure well with no immediate complications.

## 2020-10-19 NOTE — H&P (Signed)
[address]       [place] [address]             [phone]                HPI: [name] returns to discuss further management of her stage IIIa non-small cell carcinoma of the right upper lobe.  [name] is a 70 year old woman with a history of tobacco abuse and mild COPD.  She has a 50-pack-year history of smoking.  She had a CT for lung cancer screening recently which showed a spiculated right upper lobe lung nodule.  She was referred to Dr. Melvyn Novas.  A PET/CT showed the nodule was hypermetabolic and there was a hypermetabolic right paratracheal lymph node.  I did a navigational bronchoscopy and endobronchial ultrasound on 05/08/2020.  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  She was seen in consultation by oncology and radiation oncology.  Dr. Julien Nordmann treated her with neoadjuvant chemotherapy with carboplatin and Alimta and Keytruda for 3 cycles.  She had a follow-up CT which showed a partial response and is now referred for consideration for surgical resection.  She tolerated the chemotherapy well.  She says she is down to smoking 1 or 2 cigarettes a day.  She is not having any chest pain, pressure, tightness, or shortness of breath.  She did not have any significant weight loss with chemotherapy.      Past Medical History:  Diagnosis Date  . Cataract   . COPD (chronic obstructive pulmonary disease) ([place])         Past Surgical History:  Procedure Laterality Date  . APPENDECTOMY  2008  . COLON SURGERY  2008   colostomy-infection post op appendectomy  . COLOSTOMY REVERSAL  2009  . VIDEO BRONCHOSCOPY WITH ENDOBRONCHIAL NAVIGATION N/A 05/08/2020   Procedure: VIDEO BRONCHOSCOPY WITH ENDOBRONCHIAL NAVIGATION;  Surgeon: [name], MD;  Location: [facility];  Service: Thoracic;  Laterality: N/A;  . VIDEO BRONCHOSCOPY WITH ENDOBRONCHIAL ULTRASOUND N/A 05/08/2020   Procedure: VIDEO BRONCHOSCOPY WITH  ENDOBRONCHIAL ULTRASOUND;  Surgeon: [name], MD;  Location: [facility];  Service: Thoracic;  Laterality: N/A;          Current Outpatient Medications  Medication Sig Dispense Refill  . albuterol (PROVENTIL HFA;VENTOLIN HFA) 108 (90 Base) MCG/ACT inhaler Inhale 2 puffs into the lungs every 4 (four) hours as needed for wheezing or shortness of breath (cough, shortness of breath or wheezing.). 1 Inhaler 1  . Calcium Carbonate (CALCIUM 600 PO) Take 600 mg by mouth 2 (two) times daily.     . Cholecalciferol (VITAMIN D3 PO) Take 2,000 Units by mouth daily.    . fluticasone (FLONASE) 50 MCG/ACT nasal spray Place 2 sprays into both nostrils daily. 16 g 12  . Fluticasone-Salmeterol (ADVAIR) 250-50 MCG/DOSE AEPB Inhale 1 puff into the lungs 2 (two) times daily.     . Multiple Vitamin (MULTIVITAMIN) tablet Take 1 tablet by mouth daily.    . prochlorperazine (COMPAZINE) 10 MG tablet Take 1 tablet (10 mg total) by mouth every 6 (six) hours as needed. 30 tablet 2   No current facility-administered medications for this visit.   Social History        Socioeconomic History  . Marital status: Married    Spouse name: Not on file  . Number of children: Not on file  . Years of education: Not on file  . Highest education level: Not on file  Occupational History  . Not on file  Tobacco Use  . Smoking status: Former Smoker    Packs/day: 0.05    Types: Cigarettes  . Smokeless tobacco: Never Used  Vaping Use  . Vaping Use: Never used  Substance and Sexual Activity  . Alcohol use: Yes    Alcohol/week: 10.0 standard drinks    Types: 10 Cans of beer per week  . Drug use: No  . Sexual activity: Not on file  Other Topics Concern  . Not on file  Social History Narrative  . Not on file   Social Determinants of Health   Financial Resource Strain: Not on file  Food Insecurity: Not on file  Transportation Needs: Not on file  Physical Activity: Not on file  Stress: Not  on file  Social Connections: Not on file  Intimate Partner Violence: Not on file    Physical Exam BP 115/69 (BP Location: Right Arm, Patient Position: Sitting, Cuff Size: Normal)   Pulse 72   Temp 97.9 F (36.6 C) (Skin)   Resp 18   Ht 5' 2.5" (1.588 m)   Wt 124 lb 6.4 oz (56.4 kg)   SpO2 95% Comment: RA  BMI 22.39 kg/m  Well-appearing 70 year old woman in no acute distress Alert and oriented x3 with no focal deficits No cervical or supraclavicular adenopathy Cardiac regular rate and rhythm, normal S1 and S2 Lungs clear with equal breath sounds bilaterally No clubbing cyanosis or edema  Diagnostic Tests: CT CHEST WITH CONTRAST  TECHNIQUE: Multidetector CT imaging of the chest was performed during intravenous contrast administration.  CONTRAST: 68mL OMNIPAQUE IOHEXOL 300 MG/ML SOLN  COMPARISON: PET-CT 04/06/2020  FINDINGS: Cardiovascular: The heart size appears within normal limits. No pericardial effusion identified. Aortic atherosclerosis. Coronary artery calcifications.  Mediastinum/Nodes: Normal appearance of the thyroid gland. The trachea appears patent and is midline. Normal appearance of the esophagus. No enlarged axillary or supraclavicular lymph nodes. Higher right paratracheal lymph node measures 0.5 cm, image 46/2. Previously 0.8 cm. Lower right paratracheal lymph node measures 0.8 mm, image 52/2. Previously 1.1 cm.  Lungs/Pleura: Index nodule within the right upper lobe anteriorly measures 0.9 x 0.8 by 0.6 cm (volume = 0.2 cm^3), image 43/7. Previously 1.0 x 0.9 by 0.9 cm (volume = 0.4 cm^3). No new lung nodules.  Moderate centrilobular emphysema. No pleural effusion, airspace consolidation or atelectasis.  Upper Abdomen: No acute abnormality.  Musculoskeletal: No chest wall abnormality. No acute or significant osseous findings.  IMPRESSION: 1. Approximate 50% decreased in total lesion volume of anterior right upper lobe index  nodule. 2. Decrease in size of previous FDG avid right paratracheal lymph nodes 3. No new sites of disease 4. Emphysema and aortic atherosclerosis. 5. Coronary artery calcifications.  Aortic Atherosclerosis (ICD10-I70.0) and Emphysema (ICD10-J43.9).   Electronically Signed By: [name] M.D. On: 08/14/2020 09:59  I personally reviewed the CT images and compared with her PET/CT and previous CT chest.  I concur with the findings noted above.  Impression: [name] is a 70 year old woman with a history of tobacco abuse, COPD who was found to have a right upper lobe lung nodule and right paratracheal adenopathy low-dose screening CT done earlier this year.  On PET CT both areas were hypermetabolic.  Bronchoscopy and endobronchial ultrasound findings were consistent with stage IIIa adenocarcinoma.  She did have single nodal station IIIa disease.  She was treated with neoadjuvant chemotherapy and Keytruda.  Follow-up CT shows significant partial response.  I discussed with [name] and her husband  the treatment options at this point they understand the traditional standard therapy for stage IIIa disease is chemoradiation.  They understand there is a select group of patients with stage IIIa disease that may benefit from surgical resection.  She would fall into that group with nodal disease limited to a single station that is resectable.  They understand there is no guarantee of a cure with surgery, but likely an improved chance of a long-term survival.  I described the proposed operation to them.  The plan would be for a robotic assisted right upper lobectomy and lymph node dissection.  The general nature of the procedure including the need for general anesthesia, the incisions to be used, the use of a drainage tube postoperatively, the expected hospital stay, and the overall recovery were discussed with the patient and her husband.  I informed him of the indications, risks,  benefits, and alternatives.  They understand the risks include, but not limited to death, MI, DVT, PE, bleeding, possible need for transfusion, possible need for conversion to thoracotomy, prolonged air leak, cardiac arrhythmias, as well as the possibility of other unforeseeable complications.  She wishes to think over her options and discuss further with her husband before making a decision as to how to proceed.  She does need pulmonary function testing with and without bronchodilators if she decides to proceed.  Plan: Pulmonary function testing Patient will call if she decides to proceed with surgical resection.  [name], MD [facility] [phone]           Electronically signed by [name], MD at 08/29/2020 11:37 AM  PFTs Ok No interval change  Revonda Standard. Roxan Hockey, MD [facility] [phone]

## 2020-10-19 NOTE — Discharge Summary (Incomplete)
Physician Discharge Summary       [address]       [place] [address]             [phone]    Patient ID: [name] MRN: [id number] DOB/AGE: 1950-12-28 70 y.o.  Admit date: 10/19/2020 Discharge date: 10/19/2020  Admission Diagnoses:  Discharge Diagnoses:  1. S/p *** 2. *** 3. History of COPD(chronic obstructive pulmonary disease) ([place]) 4. History of tobacco abuse 5. History of cataract   Consults: None  Procedure (s): ***  Pathology:*** History of Presenting Illness: This is a 70 year old woman with a history of tobacco abuse and mild COPD.  She has a 50-pack-year history of smoking.  She had a CT for lung cancer screening recently which showed a spiculated right upper lobe lung nodule.  She was referred to Dr. Melvyn Novas.  A PET/CT showed the nodule was hypermetabolic and there was a hypermetabolic right paratracheal lymph node.  I did a navigational bronchoscopy and endobronchial ultrasound on 05/08/2020.  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  She was seen in consultation by oncology and radiation oncology.  Dr. Julien Nordmann treated her with neoadjuvant chemotherapy with carboplatin and Alimta and Keytruda for 3 cycles.  She had a follow-up CT which showed a partial response and is now referred for consideration for surgical resection.  She tolerated the chemotherapy well.  She says she is down to smoking 1 or 2 cigarettes a day.  She is not having any chest pain, pressure, tightness, or shortness of breath.  She did not have any significant weight loss with chemotherapy.  Dr. Roxan Hockey discussed with [name] and her husband the treatment options at this point they understand the traditional standard therapy for stage IIIa disease is chemoradiation.  They understand there is a select group of patients with stage IIIa disease that may benefit from surgical resection.  She would fall into that group with nodal disease  limited to a single station that is resectable.  They understand there is no guarantee of a cure with surgery, but likely an improved chance of a long-term survival.  The plan would be for a robotic assisted right upper lobectomy and lymph node dissection. Potential risks, benefits, and complications of the surgery were discussed with the patient and she agreed to proceed with surgery. She underwent a robotic assisted RUL, lymph node dissection, and intercostal nerve block on 10/19/2020.   Brief Hospital Course: ***    Latest Vital Signs: Blood pressure (!) 128/52, pulse 62, temperature (!) 97.1 F (36.2 C), resp. rate 15, height 5\' 3"  (1.6 m), weight 59.9 kg, SpO2 97 %.  Physical Exam:***  Discharge Condition: Stable and discharged to home.  Recent laboratory studies:  Lab Results  Component Value Date   WBC 6.9 10/17/2020   HGB 13.0 10/17/2020   HCT 38.7 10/17/2020   MCV 96.5 10/17/2020   PLT 268 10/17/2020   Lab Results  Component Value Date   NA 137 10/17/2020   K 4.1 10/17/2020   CL 102 10/17/2020   CO2 21 (L) 10/17/2020   CREATININE 0.65 10/17/2020   GLUCOSE 97 10/17/2020      Diagnostic Studies: DG Chest 2 View  Result Date: 10/17/2020 CLINICAL DATA:  Lung cancer EXAM: CHEST - 2 VIEW COMPARISON:  08/14/2020, 05/05/2020 FINDINGS: The heart size and mediastinal contours are within normal limits. Known right upper lobe pulmonary nodule is not well seen radiographically. No focal airspace consolidation, pleural effusion,  or pneumothorax. The visualized skeletal structures are unremarkable. IMPRESSION: No active cardiopulmonary disease. Electronically Signed   By: Davina Poke D.O.   On: 10/17/2020 15:16   DG Chest Port 1 View  Result Date: 10/19/2020 CLINICAL DATA:  Postoperative right upper lobectomy EXAM: PORTABLE CHEST 1 VIEW COMPARISON:  10/17/2020 FINDINGS: Normal heart size. No shift of the heart or mediastinal structures. Large right-sided pneumothorax,  approximately 50% volume. A right chest tube is in place with distal tip terminating near the right lung apex. Interval right IJ approach central venous catheter with distal tip terminating at the level of the distal SVC. Left lung is clear. No left-sided pneumothorax. IMPRESSION: 1. Large right-sided pneumothorax, approximately 50% volume. No mediastinal shift. Right-sided chest tube is in place. 2. Right IJ approach central venous catheter tip terminates at the level of the distal SVC. Electronically Signed   By: Davina Poke D.O.   On: 10/19/2020 13:22    Discharge Medications: Allergies as of 10/19/2020   No Known Allergies   Med Rec must be completed prior to using this [facility]***       Follow Up Appointments:  Follow-up Information    [name], MD. Go on 11/07/2020.   Specialty: Cardiothoracic Surgery Why: PA/LAT CXR to be taken (at [facility] which is in the same building as Dr. Leonarda Salon office) on 02/22 at 10:30 am;Appointment time is at 11:00 am Contact information: [address] [place] [address] [phone]               Signed: Sharalyn Ink [facility] 10/19/2020, 1:31 PM

## 2020-10-19 NOTE — Brief Op Note (Addendum)
10/19/2020  12:07 PM  PATIENT:  [name]  70 y.o. female  PRE-OPERATIVE DIAGNOSIS:  RIGHT UPPER LOBE LUNG ADENOCARCINOMA- stage IIIA s/p neoadjuvant chemo  POST-OPERATIVE DIAGNOSIS:  RIGHT UPPER LOBE LUNG ADENOCARCINOMA- stage IIIA s/p neoadjuvant chemo  PROCEDURE:   XI ROBOTIC ASSISTED RIGHT THORACOSCOPY,  RIGHT UPPER LOBECTOMY, LYMPH NODE DISSECTION INTERCOSTAL NERVE BLOCKS LEVELS 3-10   SURGEON:  Surgeon(s) and Role:    [name], MD - Primary  PHYSICIAN ASSISTANT: Lars Pinks PA-C  ANESTHESIA:   general  EBL:  50 ml  BLOOD ADMINISTERED:none  DRAINS: 75 Blake drain placed in the right pleural space   LOCAL MEDICATIONS USED:  OTHER Exparel  SPECIMEN:  Source of Specimen:  RUL and multiple lymph nodes  DISPOSITION OF SPECIMEN:  PATHOLOGY  COUNTS CORRECT:  YES  DICTATION: .Dragon Dictation  PLAN OF CARE: Admit to inpatient   PATIENT DISPOSITION:  PACU - hemodynamically stable.   Delay start of Pharmacological VTE agent (>24hrs) due to surgical blood loss or risk of bleeding: no

## 2020-10-19 NOTE — Transfer of Care (Signed)
Immediate Anesthesia Transfer of Care Note  Patient: [name]  Procedure(s) Performed: XI ROBOTIC ASSISTED THORASCOPY- RIGHT UPPER LOBECTOMY (Right Chest) INTERCOSTAL NERVE BLOCK (Right Chest) NODE DISSECTION (Right Chest)  Patient Location: PACU  Anesthesia Type:General  Level of Consciousness: awake and alert   Airway & Oxygen Therapy: Patient Spontanous Breathing and Patient connected to face mask oxygen  Post-op Assessment: Report given to RN and Post -op Vital signs reviewed and stable  Post vital signs: Reviewed and stable  Last Vitals:  Vitals Value Taken Time  BP 128/52 10/19/20 1234  Temp    Pulse 60 10/19/20 1236  Resp 15 10/19/20 1236  SpO2 100 % 10/19/20 1236  Vitals shown include unvalidated device data.  Last Pain:  Vitals:   10/19/20 0650  TempSrc:   PainSc: 0-No pain      Patients Stated Pain Goal: 2 (11/06/77 8102)  Complications: No complications documented.

## 2020-10-19 NOTE — Anesthesia Procedure Notes (Addendum)
Arterial Line Insertion Start/End2/11/2020 7:10 AM, 10/19/2020 7:15 AM Performed by: Candis Shine, CRNA, CRNA  Patient location: Pre-op. Preanesthetic checklist: patient identified, IV checked, site marked, risks and benefits discussed, surgical consent, monitors and equipment checked, pre-op evaluation, timeout performed and anesthesia consent Lidocaine 1% used for infiltration Left, radial was placed Catheter size: 20 G  Attempts: 3 (first two unsuccessful attempts by [facility]) Procedure performed without using ultrasound guided technique. Following insertion, dressing applied and Biopatch. Post procedure assessment: normal  Patient tolerated the procedure well with no immediate complications.

## 2020-10-19 NOTE — Progress Notes (Signed)
Notified MD about low SBP's in the  80's HR 57. Patient asymptomatic however this is a change lower than she was today. Output from chest tube for shift so far is 50cc. Order received to give Albumin. I will continue to monitor.

## 2020-10-20 ENCOUNTER — Inpatient Hospital Stay (HOSPITAL_COMMUNITY): Payer: Medicare Other

## 2020-10-20 ENCOUNTER — Encounter (HOSPITAL_COMMUNITY): Payer: Self-pay | Admitting: Thoracic Surgery (Cardiothoracic Vascular Surgery)

## 2020-10-20 LAB — BASIC METABOLIC PANEL
Anion gap: 9 (ref 5–15)
BUN: 8 mg/dL (ref 8–23)
CO2: 24 mmol/L (ref 22–32)
Calcium: 7.7 mg/dL — ABNORMAL LOW (ref 8.9–10.3)
Chloride: 102 mmol/L (ref 98–111)
Creatinine, Ser: 0.59 mg/dL (ref 0.44–1.00)
GFR, Estimated: >60 mL/min (ref >60)
Glucose, Bld: 92 mg/dL (ref 70–99)
Potassium: 3.7 mmol/L (ref 3.5–5.1)
Sodium: 135 mmol/L (ref 135–145)

## 2020-10-20 LAB — CBC
HCT: 30.7 % — ABNORMAL LOW (ref 36.0–46.0)
Hemoglobin: 10.2 g/dL — ABNORMAL LOW (ref 12.0–15.0)
MCH: 32.5 pg (ref 26.0–34.0)
MCHC: 33.2 g/dL (ref 30.0–36.0)
MCV: 97.8 fL (ref 80.0–100.0)
Platelets: 214 10*3/uL (ref 150–400)
RBC: 3.14 MIL/uL — ABNORMAL LOW (ref 3.87–5.11)
RDW: 12.3 % (ref 11.5–15.5)
WBC: 8.1 10*3/uL (ref 4.0–10.5)
nRBC: 0 % (ref 0.0–0.2)

## 2020-10-20 IMAGING — DX DG CHEST 1V PORT
1 series · 1 of 1 positions shown · non-contrast
Comparison: Radiograph [DATE]

CLINICAL DATA: Pneumothorax

EXAM:
PORTABLE CHEST 1 VIEW

[chest ap]
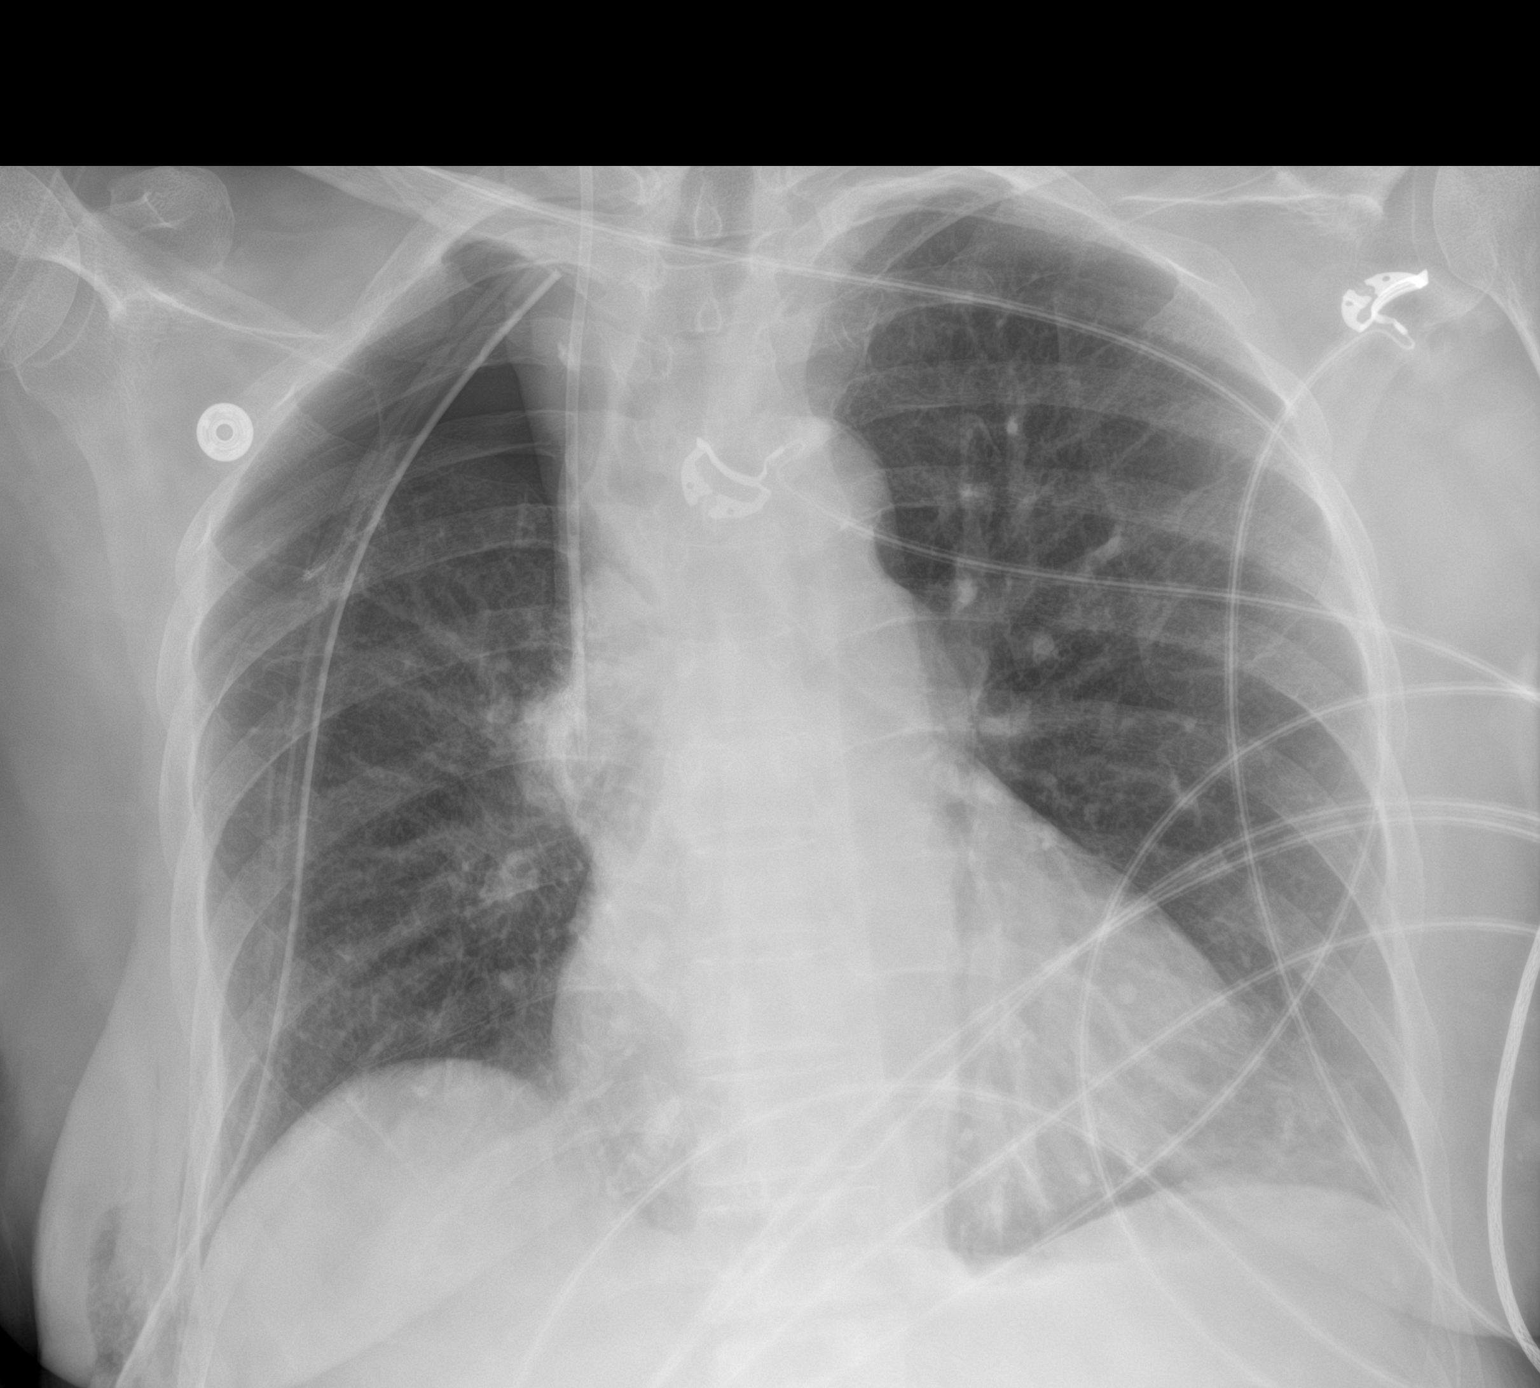

[1 of 1 positions shown; findings below may reference images not displayed]

FINDINGS: Right apically directed chest tube remains in place. There is slight
apparent decrease in the size of the right apical pneumothorax
though this may in part be due to changes in patient positioning.
Some right perihilar opacity is again seen. May reflect some
atelectatic change given the size of the pneumothorax component.
Left lung is relatively clear aside from some streaky basilar
atelectatic changes. Cardiomediastinal contours are stable. Right IJ
catheter tip terminates in the mid SVC. Additional telemetry leads
and support devices overlie the chest. Small amount of subcutaneous
emphysema over the right chest wall near the chest tube insertion
site.
IMPRESSION: 1. Right apically directed chest tube remains in place. Some
apparent decrease in the size of the right apical pneumothorax
though may in part be due to differences in patient positioning.
2. Some right perihilar opacity could reflect atelectatic changes
related to the partial collapse the right lung.
3. Stable positioning of a right IJ approach central venous
catheter.

## 2020-10-20 NOTE — Plan of Care (Signed)

## 2020-10-20 NOTE — Hospital Course (Signed)
History of present illness: [name] is a 70 year old woman with a history of tobacco abuse and mild COPD.  She has a 50-pack-year history of smoking.  She had a CT for lung cancer screening recently which showed a spiculated right upper lobe lung nodule.  She was referred to Dr. Melvyn Novas.  A PET/CT showed the nodule was hypermetabolic and there was a hypermetabolic right paratracheal lymph node.  I did a navigational bronchoscopy and endobronchial ultrasound on 05/08/2020.  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   She was seen in consultation by oncology and radiation oncology.  Dr. Julien Nordmann treated her with neoadjuvant chemotherapy with carboplatin and Alimta and Keytruda for 3 cycles.  She had a follow-up CT which showed a partial response and is now referred for consideration for surgical resection.   She tolerated the chemotherapy well.  She says she is down to smoking 1 or 2 cigarettes a day.  She is not having any chest pain, pressure, tightness, or shortness of breath.  She did not have any significant weight loss with chemotherapy.  The patient has been monitored closely by Dr. Roxan Hockey and is felt at this time to be a candidate for surgical resection and was admitted this hospitalization for the procedure.  Hospital course: The patient was admitted electively and on 10/19/2020 taken to the operating room where she underwent the following procedure:     DATE OF PROCEDURE:  10/19/2020   PREOPERATIVE DIAGNOSIS:  Stage IIIA nonsmall cell carcinoma, right upper lobe, status post neoadjuvant chemotherapy.   POSTOPERATIVE DIAGNOSIS:  Stage IIIA nonsmall cell carcinoma, right upper lobe, status post neoadjuvant chemotherapy.   PROCEDURE:  Xi robotic-assisted right thoracoscopy, right upper lobectomy, lymph node dissection, and intercostal nerve blocks levels 3 through 10.   SURGEON:  Modesto Charon, MD   ASSISTANT:  Lars Pinks, PA.   ANESTHESIA:   General.   Postoperative hospital course:  The patient did initially have some postoperative hypotension and was treated with albumin and IV fluids.  Postoperative day 1 was notable for a large air leak and a space on chest x-ray so chest tube is kept to suction.  She is noted to have a mild expected acute blood loss anemia which will be monitored clinically.

## 2020-10-20 NOTE — Progress Notes (Addendum)
[address]       [place] [address]             [phone]      1 Day Post-Op Procedure(s) (LRB): XI ROBOTIC ASSISTED THORASCOPY- RIGHT UPPER LOBECTOMY (Right) INTERCOSTAL NERVE BLOCK (Right) NODE DISSECTION (Right) Subjective: Breathing is comfortable. A little dizzy when she got up  Objective: Vital signs in last 24 hours: Temp:  [97.1 F (36.2 C)-98.5 F (36.9 C)] 98.2 F (36.8 C) (02/04 0402) Pulse Rate:  [51-63] 61 (02/03 1434) Cardiac Rhythm: Sinus bradycardia (02/03 2000) Resp:  [0-27] 13 (02/04 0402) BP: (81-132)/(32-87) 92/54 (02/04 0402) SpO2:  [95 %-100 %] 97 % (02/04 0402) Arterial Line BP: (120-150)/(52-65) 143/57 (02/03 1404)  Hemodynamic parameters for last 24 hours:    Intake/Output from previous day: 02/03 0701 - 02/04 0700 In: 2191.8 [I.V.:2191.8] Out: 1140 [Urine:670; Blood:50; Chest Tube:420] Intake/Output this shift: No intake/output data recorded.  General appearance: alert, cooperative and no distress Heart: regular rate and rhythm Lungs: coarse with crackles Abdomen: benign Extremities: no edema- PAS in place Wound: incis healing well, CT site looks good, tube not kinked  Lab Results: Recent Labs    10/17/20 1015 10/19/20 0905 10/19/20 1008 10/20/20 0418  WBC 6.9  --   --  8.1  HGB 13.0   < > 11.9* 10.2*  HCT 38.7   < > 35.0* 30.7*  PLT 268  --   --  214   < > = values in this interval not displayed.   BMET:  Recent Labs    10/17/20 1015 10/19/20 0905 10/19/20 1008 10/20/20 0418  NA 137   < > 138 135  K 4.1   < > 3.8 3.7  CL 102  --   --  102  CO2 21*  --   --  24  GLUCOSE 97  --   --  92  BUN 9  --   --  8  CREATININE 0.65  --   --  0.59  CALCIUM 9.4  --   --  7.7*   < > = values in this interval not displayed.    PT/INR:  Recent Labs    10/17/20 1015  LABPROT 11.9  INR 0.9   ABG    Component Value Date/Time   PHART 7.391 10/19/2020 1008   HCO3 26.6 10/19/2020 1008   TCO2 28  10/19/2020 1008   O2SAT 98.0 10/19/2020 1008   CBG (last 3)  No results for input(s): GLUCAP in the last 72 hours.  Meds Scheduled Meds: . acetaminophen  1,000 mg Oral Q6H   Or  . acetaminophen (TYLENOL) oral liquid 160 mg/5 mL  1,000 mg Oral Q6H  . bisacodyl  10 mg Oral Daily  . Chlorhexidine Gluconate Cloth  6 each Topical Daily  . enoxaparin (LOVENOX) injection  40 mg Subcutaneous Q24H  . fluticasone  2 spray Each Nare Daily  . ketorolac  15 mg Intravenous Q6H  . mometasone-formoterol  2 puff Inhalation BID  . multivitamin with minerals  1 tablet Oral Daily  . senna-docusate  1 tablet Oral QHS   Continuous Infusions: . 0.9 % NaCl with KCl 20 mEq / L 75 mL/hr at 10/20/20 0624   PRN Meds:.levalbuterol, ondansetron (ZOFRAN) IV, oxyCODONE, traMADol  Xrays DG CHEST PORT 1 VIEW  Result Date: 10/20/2020 CLINICAL DATA:  Pneumothorax EXAM: PORTABLE CHEST 1 VIEW COMPARISON:  Radiograph 10/19/2020 FINDINGS: Right apically directed chest tube remains in place. There  is slight apparent decrease in the size of the right apical pneumothorax though this may in part be due to changes in patient positioning. Some right perihilar opacity is again seen. May reflect some atelectatic change given the size of the pneumothorax component. Left lung is relatively clear aside from some streaky basilar atelectatic changes. Cardiomediastinal contours are stable. Right IJ catheter tip terminates in the mid SVC. Additional telemetry leads and support devices overlie the chest. Small amount of subcutaneous emphysema over the right chest wall near the chest tube insertion site. IMPRESSION: 1. Right apically directed chest tube remains in place. Some apparent decrease in the size of the right apical pneumothorax though may in part be due to differences in patient positioning. 2. Some right perihilar opacity could reflect atelectatic changes related to the partial collapse the right lung. 3. Stable positioning of a right  IJ approach central venous catheter. Electronically Signed   By: Lovena Le M.D.   On: 10/20/2020 04:06   DG Chest Port 1 View  Result Date: 10/19/2020 CLINICAL DATA:  Postoperative right upper lobectomy EXAM: PORTABLE CHEST 1 VIEW COMPARISON:  10/17/2020 FINDINGS: Normal heart size. No shift of the heart or mediastinal structures. Large right-sided pneumothorax, approximately 50% volume. A right chest tube is in place with distal tip terminating near the right lung apex. Interval right IJ approach central venous catheter with distal tip terminating at the level of the distal SVC. Left lung is clear. No left-sided pneumothorax. IMPRESSION: 1. Large right-sided pneumothorax, approximately 50% volume. No mediastinal shift. Right-sided chest tube is in place. 2. Right IJ approach central venous catheter tip terminates at the level of the distal SVC. Electronically Signed   By: Davina Poke D.O.   On: 10/19/2020 13:22    Assessment/Plan: S/P Procedure(s) (LRB): XI ROBOTIC ASSISTED THORASCOPY- RIGHT UPPER LOBECTOMY (Right) INTERCOSTAL NERVE BLOCK (Right) NODE DISSECTION (Right)  1 afeb, VSS with some lower systolic BP readings, sinus rhythm, received albumin last pm, will increase IVF rate 2 sats good on 2 liters 3 normal renal fxn, fair UOP if accurate 4 expected ABLA- monitor  Clinically , not in transfusion zone 5 no leukocytosis 6 CXR noted right apical pntx, improved- cont CT to suction 7 420 cc drainage from CT - keep in place 8 labs repeated for am 9 lovenox for DVT PPX 10 routine pulm toilet/rehab modalities  LOS: 1 day    John Giovanni  PA-C Pager [phone] 10/20/2020 Patient seen and examined, agree with above Has a fairly large air leak and there is a space on CXR- keep CT on suction Mobilize as tolerated  Remo Lipps C. Roxan Hockey, MD [facility] [phone]

## 2020-10-21 ENCOUNTER — Inpatient Hospital Stay (HOSPITAL_COMMUNITY): Payer: Medicare Other

## 2020-10-21 LAB — COMPREHENSIVE METABOLIC PANEL
ALT: 32 U/L (ref 0–44)
AST: 41 U/L (ref 15–41)
Albumin: 2.7 g/dL — ABNORMAL LOW (ref 3.5–5.0)
Alkaline Phosphatase: 62 U/L (ref 38–126)
Anion gap: 6 (ref 5–15)
BUN: 9 mg/dL (ref 8–23)
CO2: 26 mmol/L (ref 22–32)
Calcium: 7.9 mg/dL — ABNORMAL LOW (ref 8.9–10.3)
Chloride: 104 mmol/L (ref 98–111)
Creatinine, Ser: 0.64 mg/dL (ref 0.44–1.00)
GFR, Estimated: >60 mL/min (ref >60)
Glucose, Bld: 97 mg/dL (ref 70–99)
Potassium: 3.9 mmol/L (ref 3.5–5.1)
Sodium: 136 mmol/L (ref 135–145)
Total Bilirubin: 0.6 mg/dL (ref 0.3–1.2)
Total Protein: 5.3 g/dL — ABNORMAL LOW (ref 6.5–8.1)

## 2020-10-21 LAB — CBC
HCT: 33 % — ABNORMAL LOW (ref 36.0–46.0)
Hemoglobin: 10.3 g/dL — ABNORMAL LOW (ref 12.0–15.0)
MCH: 31.5 pg (ref 26.0–34.0)
MCHC: 31.2 g/dL (ref 30.0–36.0)
MCV: 100.9 fL — ABNORMAL HIGH (ref 80.0–100.0)
Platelets: 209 10*3/uL (ref 150–400)
RBC: 3.27 MIL/uL — ABNORMAL LOW (ref 3.87–5.11)
RDW: 12.1 % (ref 11.5–15.5)
WBC: 7.7 10*3/uL (ref 4.0–10.5)
nRBC: 0 % (ref 0.0–0.2)

## 2020-10-21 IMAGING — DX DG CHEST 1V PORT
1 series · 1 of 1 positions shown · non-contrast
Comparison: One-view chest x-ray [DATE]

CLINICAL DATA: Pneumothorax.

EXAM:
PORTABLE CHEST 1 VIEW

[chest ap]
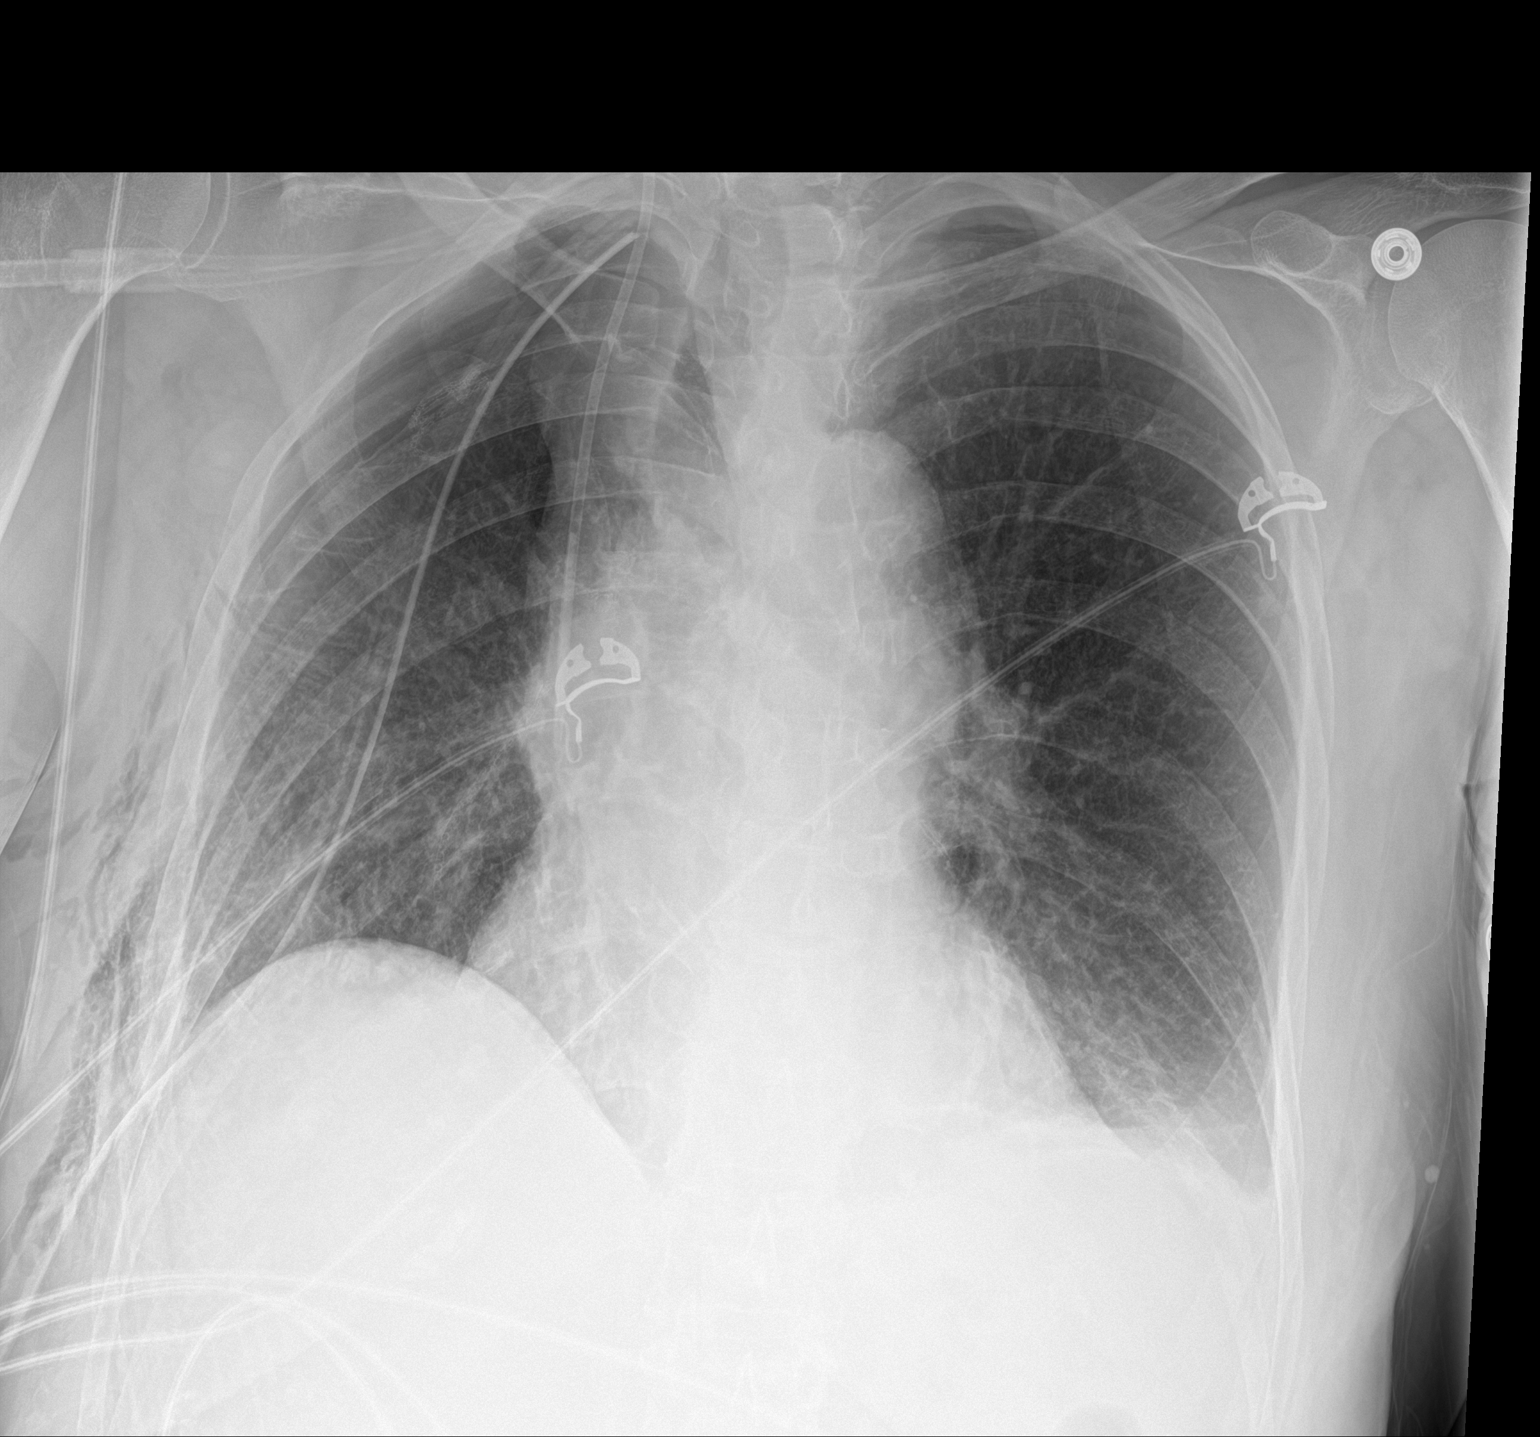

[1 of 1 positions shown; findings below may reference images not displayed]

FINDINGS: Heart size normal.  Right IJ line is stable.

Right-sided chest tube is in place. Right apical pneumothorax is
decreasing, now approximately 20%. Subcutaneous emphysema has
increased. Bilateral pleural effusions are new. Bibasilar
atelectasis is noted.
IMPRESSION: 1. Decreasing right apical pneumothorax, now approximately 20%.
2. Increasing subcutaneous emphysema.
3. New bilateral pleural effusions.
4. Bibasilar atelectasis.

## 2020-10-21 MED ORDER — GUAIFENESIN ER 600 MG PO TB12
600.0000 mg | ORAL_TABLET | Freq: Two times a day (BID) | ORAL | Status: DC
  Administered 2020-10-21 – 2020-10-28 (×15): 600 mg via ORAL
  Filled 2020-10-21 (×15): qty 1

## 2020-10-21 NOTE — Plan of Care (Signed)

## 2020-10-21 NOTE — Progress Notes (Signed)
      [address]       [place] [address]             [phone]      2 Days Post-Op Procedure(s) (LRB): XI ROBOTIC ASSISTED THORASCOPY- RIGHT UPPER LOBECTOMY (Right) INTERCOSTAL NERVE BLOCK (Right) NODE DISSECTION (Right) Subjective: Feels good this morning no complaints.   Objective: Vital signs in last 24 hours: Temp:  [97.8 F (36.6 C)-98.4 F (36.9 C)] 98 F (36.7 C) (02/05 0827) Cardiac Rhythm: Normal sinus rhythm (02/05 0700) Resp:  [12-20] 20 (02/05 0827) BP: (90-102)/(50-58) 93/51 (02/05 0827) SpO2:  [93 %-97 %] 97 % (02/05 0400)     Intake/Output from previous day: 02/04 0701 - 02/05 0700 In: -  Out: Spencer [Urine:1100; Chest Tube:660] Intake/Output this shift: No intake/output data recorded.  General appearance: alert, cooperative and no distress Heart: regular rate and rhythm, S1, S2 normal, no murmur, click, rub or gallop Lungs: clear to auscultation bilaterally and right chest with rhonchi Abdomen: soft, non-tender; bowel sounds normal; no masses,  no organomegaly Extremities: extremities normal, atraumatic, no cyanosis or edema Wound: clean and dry  Lab Results: Recent Labs    10/20/20 0418 10/21/20 0047  WBC 8.1 7.7  HGB 10.2* 10.3*  HCT 30.7* 33.0*  PLT 214 209   BMET:  Recent Labs    10/20/20 0418 10/21/20 0047  NA 135 136  K 3.7 3.9  CL 102 104  CO2 24 26  GLUCOSE 92 97  BUN 8 9  CREATININE 0.59 0.64  CALCIUM 7.7* 7.9*    PT/INR: No results for input(s): LABPROT, INR in the last 72 hours. ABG    Component Value Date/Time   PHART 7.391 10/19/2020 1008   HCO3 26.6 10/19/2020 1008   TCO2 28 10/19/2020 1008   O2SAT 98.0 10/19/2020 1008   CBG (last 3)  No results for input(s): GLUCAP in the last 72 hours.  Assessment/Plan: S/P Procedure(s) (LRB): XI ROBOTIC ASSISTED THORASCOPY- RIGHT UPPER LOBECTOMY (Right) INTERCOSTAL NERVE BLOCK (Right) NODE DISSECTION (Right)  1. Hypotensive at times. Off fluids.  NSRin the 77s.  2. CXR stable with expected space after right upper lobectomy. Keep to suction and monitor subq emphysema.  3. Creatinine stable. Electrolytes okay 4. H and H stable 10.3/33.0 5. Blood glucose stable  Plan: Add mucinex for secretions. Continue to encourage use of incentive spirometer. Foley catheter out and encouraged to ambulate. Chest tube with large air leak. Leave to suction.    LOS: 2 days    Elgie Collard 10/21/2020

## 2020-10-22 ENCOUNTER — Inpatient Hospital Stay (HOSPITAL_COMMUNITY): Payer: Medicare Other

## 2020-10-22 IMAGING — DX DG CHEST 1V PORT
1 series · 1 of 1 positions shown · non-contrast
Comparison: Portable chest [DATE] and earlier.

CLINICAL DATA: 69-year-old female postoperative day 3 status post
right upper lobectomy, lymph node dissection for non-small cell
carcinoma. Air leak.

EXAM:
PORTABLE CHEST 1 VIEW

[chest ap]
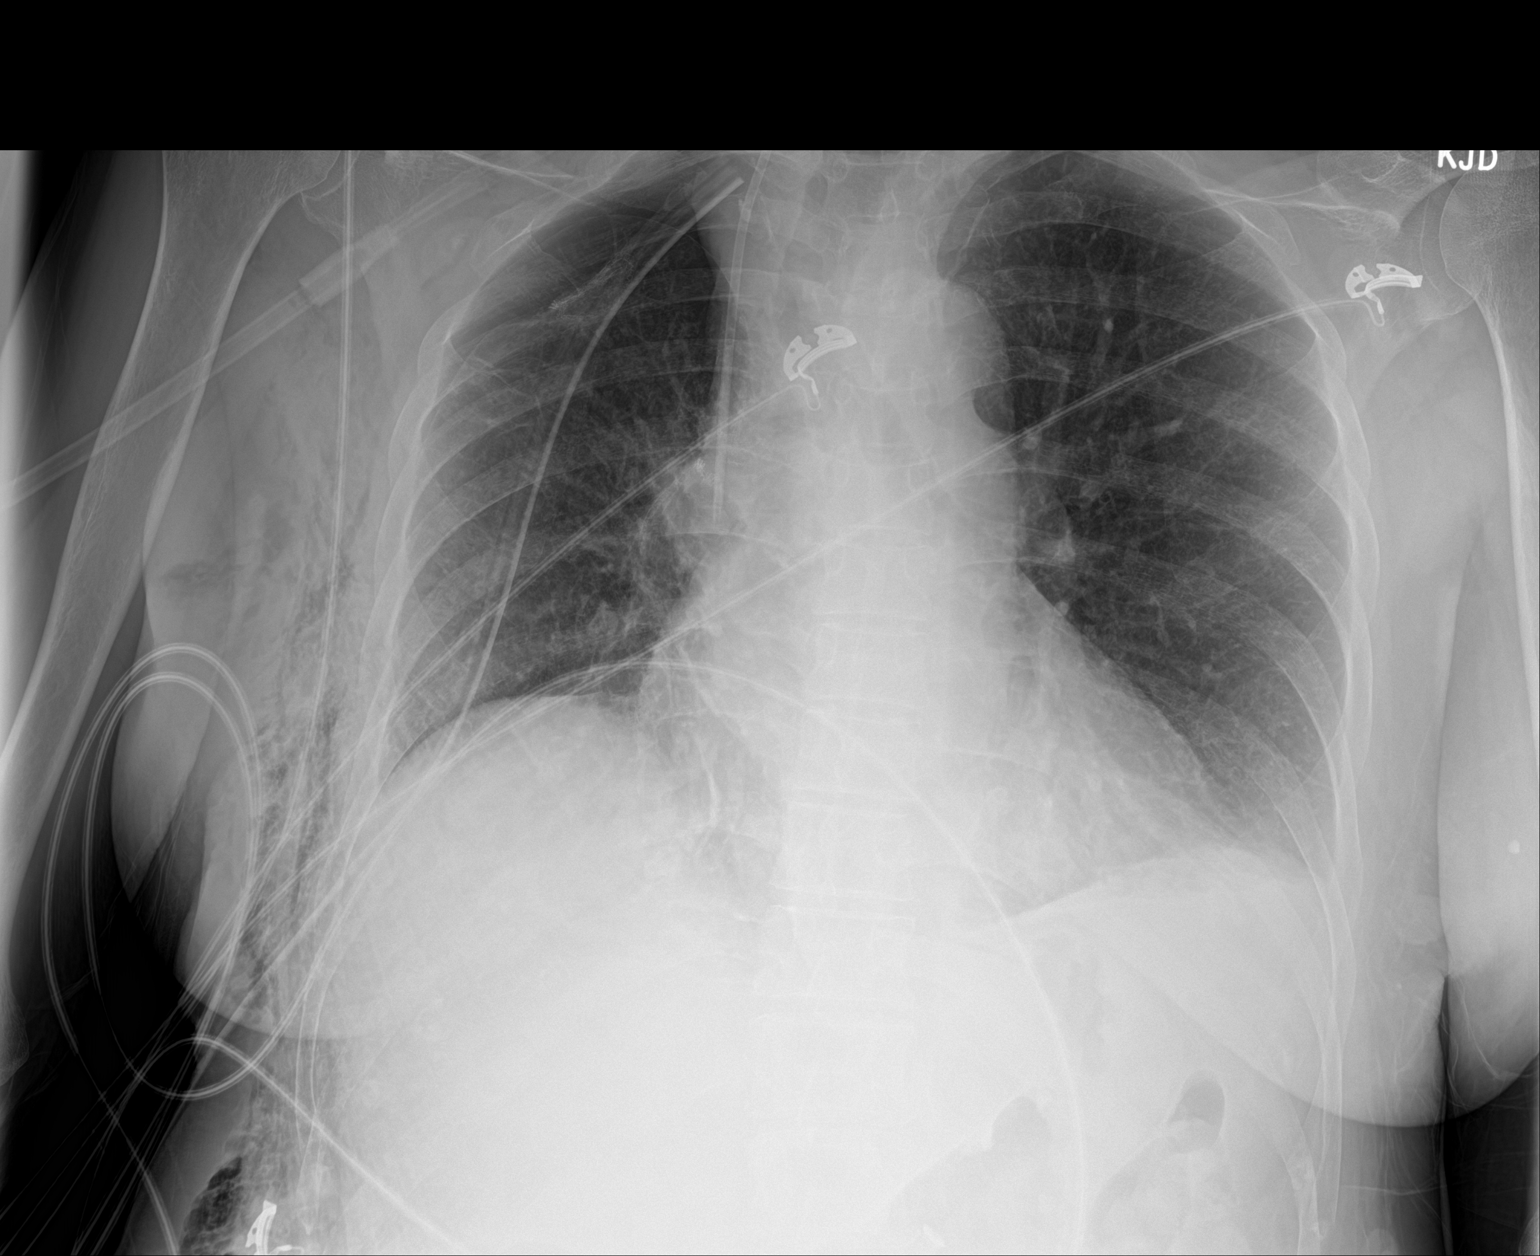

[1 of 1 positions shown; findings below may reference images not displayed]

FINDINGS: Portable AP semi upright view at [VI] hours. Stable right chest tube
and right IJ central line. Small right side pneumothorax has
decreased. Residual right lung ventilation appears satisfactory.
Mild bilateral lung base atelectasis suspected. Stable cardiac size
and mediastinal contours.

Moderate right chest wall soft tissue gas is stable. Stable
visualized osseous structures. Negative visible bowel gas pattern.
IMPRESSION: 1. Stable right chest tube and right IJ central line.
2. Small right side pneumothorax following upper lobectomy has
decreased. Stable right chest wall gas.
3. Mild atelectasis.

## 2020-10-22 NOTE — Plan of Care (Signed)

## 2020-10-22 NOTE — Progress Notes (Addendum)
      [address]       [place] [address]             [phone]      3 Days Post-Op Procedure(s) (LRB): XI ROBOTIC ASSISTED THORASCOPY- RIGHT UPPER LOBECTOMY (Right) INTERCOSTAL NERVE BLOCK (Right) NODE DISSECTION (Right) Subjective: Pain has been well controlled.   Objective: Vital signs in last 24 hours: Temp:  [97.7 F (36.5 C)-98.2 F (36.8 C)] 97.9 F (36.6 C) (02/06 0836) Pulse Rate:  [53-61] 59 (02/06 0836) Cardiac Rhythm: Normal sinus rhythm (02/06 0749) Resp:  [13-19] 16 (02/06 0836) BP: (85-110)/(50-59) 85/50 (02/06 0836) SpO2:  [92 %-100 %] 96 % (02/06 0836)    Intake/Output from previous day: 02/05 0701 - 02/06 0700 In: 1220.4 [I.V.:1220.4] Out: 500 [Chest Tube:500] Intake/Output this shift: Total I/O In: -  Out: 60 [Chest Tube:60]  General appearance: alert, cooperative and no distress Heart: sinus bradycardia Lungs: clear to auscultation bilaterally Abdomen: soft, non-tender; bowel sounds normal; no masses,  no organomegaly Extremities: extremities normal, atraumatic, no cyanosis or edema Wound: clean and dry  Lab Results: Recent Labs    10/20/20 0418 10/21/20 0047  WBC 8.1 7.7  HGB 10.2* 10.3*  HCT 30.7* 33.0*  PLT 214 209   BMET:  Recent Labs    10/20/20 0418 10/21/20 0047  NA 135 136  K 3.7 3.9  CL 102 104  CO2 24 26  GLUCOSE 92 97  BUN 8 9  CREATININE 0.59 0.64  CALCIUM 7.7* 7.9*    PT/INR: No results for input(s): LABPROT, INR in the last 72 hours. ABG    Component Value Date/Time   PHART 7.391 10/19/2020 1008   HCO3 26.6 10/19/2020 1008   TCO2 28 10/19/2020 1008   O2SAT 98.0 10/19/2020 1008   CBG (last 3)  No results for input(s): GLUCAP in the last 72 hours.  Assessment/Plan: S/P Procedure(s) (LRB): XI ROBOTIC ASSISTED THORASCOPY- RIGHT UPPER LOBECTOMY (Right) INTERCOSTAL NERVE BLOCK (Right) NODE DISSECTION (Right)  1. Hypotensive at times but this is normal for the patient. NSRin the 24s.   2. CXR stable with expected space after right upper lobectomy. Keep to suction and monitor subq emphysema. ++ air leak.  3. Creatinine stable. Electrolytes okay 4. H and H stable 10.3/33.0 5. Blood glucose stable  Plan: Keep chest tube to suction, CXR in the morning. Encouragd to ambulate and use incentive spirometer. Central line out today if peripheral obtained.    LOS: 3 days    Elgie Collard 10/22/2020 Pt seen and examined; agree with documentation including assessment and plan. May try WS in the morning. Deryck Hippler Z. Orvan Seen, [place]

## 2020-10-23 ENCOUNTER — Inpatient Hospital Stay (HOSPITAL_COMMUNITY): Payer: Medicare Other

## 2020-10-23 LAB — CBC
HCT: 36 % (ref 36.0–46.0)
Hemoglobin: 11.8 g/dL — ABNORMAL LOW (ref 12.0–15.0)
MCH: 32.4 pg (ref 26.0–34.0)
MCHC: 32.8 g/dL (ref 30.0–36.0)
MCV: 98.9 fL (ref 80.0–100.0)
Platelets: 243 10*3/uL (ref 150–400)
RBC: 3.64 MIL/uL — ABNORMAL LOW (ref 3.87–5.11)
RDW: 12.1 % (ref 11.5–15.5)
WBC: 7.9 10*3/uL (ref 4.0–10.5)
nRBC: 0 % (ref 0.0–0.2)

## 2020-10-23 LAB — BASIC METABOLIC PANEL
Anion gap: 8 (ref 5–15)
BUN: 8 mg/dL (ref 8–23)
CO2: 25 mmol/L (ref 22–32)
Calcium: 8.3 mg/dL — ABNORMAL LOW (ref 8.9–10.3)
Chloride: 105 mmol/L (ref 98–111)
Creatinine, Ser: 0.56 mg/dL (ref 0.44–1.00)
GFR, Estimated: >60 mL/min (ref >60)
Glucose, Bld: 85 mg/dL (ref 70–99)
Potassium: 3.9 mmol/L (ref 3.5–5.1)
Sodium: 138 mmol/L (ref 135–145)

## 2020-10-23 IMAGING — DX DG CHEST 1V PORT
1 series · 1 of 1 positions shown · non-contrast
Comparison: Portable chest [DATE] and earlier.

CLINICAL DATA: 69-year-old female postoperative day 4 status post
right upper lobectomy and lymph node dissection for non-small cell
carcinoma. Air leak.

EXAM:
PORTABLE CHEST 1 VIEW

[chest]
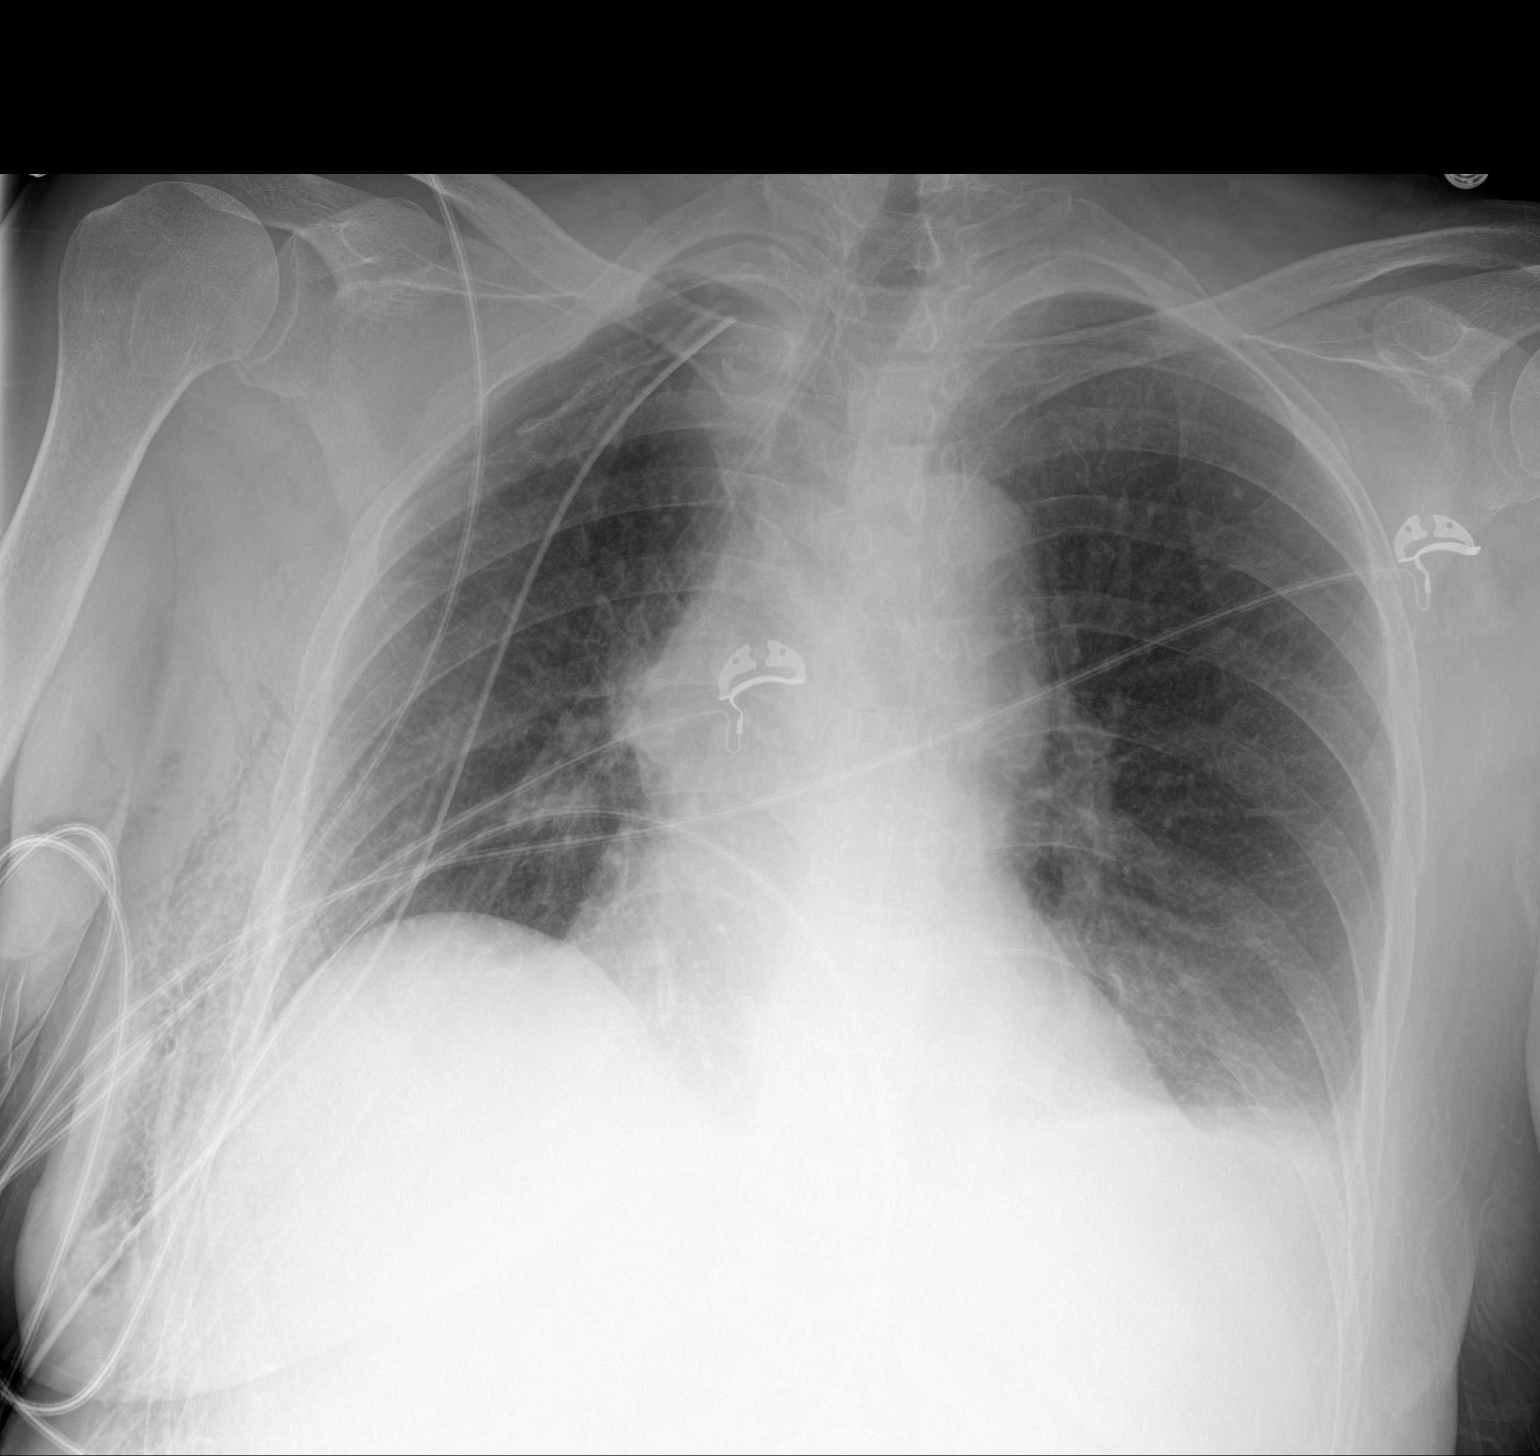

[1 of 1 positions shown; findings below may reference images not displayed]

FINDINGS: Portable AP semi upright view at [L0] hours. Right IJ central line
has been removed. Right chest tube is stable. Small right
pneumothorax is stable. Right chest wall gas is stable.

Stable mildly low lung volumes. Stable cardiac size and mediastinal
contours. Bilateral ventilation is stable. No new pulmonary opacity.
Paucity of gas in the upper abdomen. Stable visualized osseous
structures.
IMPRESSION: 1. Right IJ central line removed.
2. Stable right chest tube and small right pneumothorax.
3. No new cardiopulmonary abnormality.

## 2020-10-23 NOTE — Progress Notes (Signed)
At approximately 1800 patient had a hard coughing fit. She then put her light and was upset, reporting that she feels like she now "has air in her left cheek" and can even "hear it crackling" in her ear. Patient is teary and fearful.   Vitals were taken and patient examined. On examination, patient has subcutaneous air in the left cheek area of the face, down the neck on both sides, and across the entire upper chest.   Physician paged, and verbal order to turn suction back on to 20 cm. Of note, the suction had been changed to water seal per physician rounding this a.m.

## 2020-10-23 NOTE — Discharge Summary (Addendum)
Physician Discharge Summary       [address]       [place] [address]             [phone]    Patient ID: [name] MRN: [id number] DOB/AGE: 1950-10-21 70 y.o.  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 S/p robotic assisted thoracoscopic resection of the right upper lobe.  Lymph node dissection and intercostal nerve block. 2. 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 History of COPD(chronic obstructive pulmonary disease) ([place]) 4. History of tobacco abuse 5. History of cataract   Patient Active Problem List   Diagnosis Date Noted  . Mass of upper lobe of right lung 10/19/2020  . Sore gums 06/29/2020  . Encounter for antineoplastic chemotherapy 05/23/2020  . Encounter for antineoplastic immunotherapy 05/23/2020  . Goals of care, counseling/discussion 05/16/2020  . Malignant neoplasm of upper lobe of right lung ([place]) 05/10/2020  . Solitary pulmonary nodule on lung CT 03/29/2020  . Positive FIT (fecal immunochemical test) 04/15/2018  . Osteopenia 02/15/2018  . COPD probable gold 0  11/04/2017  . Wears partial dentures 09/15/2017  . Cigarette smoker 09/15/2017   Consults: None  Procedure (s): surgery  OPERATIVE REPORT  DATE OF PROCEDURE:  10/19/2020  PREOPERATIVE DIAGNOSIS:  Stage IIIA nonsmall cell carcinoma, right upper lobe, status post neoadjuvant chemotherapy.  POSTOPERATIVE DIAGNOSIS:  Stage IIIA nonsmall cell carcinoma, right upper lobe, status post neoadjuvant chemotherapy.  PROCEDURE:  Xi robotic-assisted right thoracoscopy, right upper lobectomy, lymph node dissection, and intercostal nerve blocks levels 3 through 10.  SURGEON:  Christina Charon, MD  ASSISTANT:  Christina Pinks,  PA.  ANESTHESIA:  General.   Pathology:  A. LYMPH NODE, 9, BIOPSY:  - One lymph node, negative for carcinoma (0/1).   B. LYMPH NODE, 7, BIOPSY:  - One lymph node, negative for carcinoma (0/1).   C. LYMPH NODE, 11, BIOPSY:  - One lymph node, negative for carcinoma (0/1).   D. LYMPH NODE, 7 #2, BIOPSY:  - One lymph node, negative for carcinoma (0/1).   E. LYMPH NODE, 7 #3, BIOPSY:  - One lymph node, negative for carcinoma (0/1).   F. LYMPH NODE, 10, BIOPSY:  - One lymph node, negative for carcinoma (0/1).   G. LYMPH NODE, 4R, BIOPSY:  - Metastatic carcinoma in (1) of (1) lymph node.   H. LYMPH NODE, 2R #1, BIOPSY:  - One lymph node, negative for carcinoma (0/1).   I. LYMPH NODE, 2R #2, BIOPSY:  - One lymph node, negative for carcinoma (0/1).   J. LYMPH NODE, 4R #2, BIOPSY:  - One lymph node, negative for carcinoma (0/1).   K. LYMPH NODE, 12 #1, BIOPSY:  - One lymph node, negative for carcinoma (0/1).   L. LYMPH NODE, 12 #2, BIOPSY:  - One lymph node, negative for carcinoma (0/1).   M. LYMPH NODE, 12 #3, BIOPSY:  - Metastatic carcinoma in (1) of (1) lymph node.   N. LUNG, RIGHT UPPER LOBE, LOBECTOMY:  - Residual adenocarcinoma status post neoadjuvant treatment.  - No visceral pleura invasion identified.  - Margins of resection are not involved.  - Metastatic carcinoma in (2) of (2) hilar lymph nodes.  - See oncology table.   O. LYMPH NODE, 13 #1, BIOPSY:  - One lymph node, negative for carcinoma (0/1).  TNM CODE:ypT1a, ypN2   History of Presenting Illness: This is a 70 year old woman with a history of tobacco abuse and mild COPD.  She has a 50-pack-year history of smoking.  She had a CT for lung cancer screening recently which showed a spiculated right upper lobe lung nodule.  She was referred to [name].  A PET/CT showed the nodule was hypermetabolic and there was a hypermetabolic right paratracheal lymph node.  I did a navigational bronchoscopy and  endobronchial ultrasound on 05/08/2020.  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  She was seen in consultation by oncology and radiation oncology.  Dr. Julien Frederick treated her with neoadjuvant chemotherapy with carboplatin and Alimta and Keytruda for 3 cycles.  She had a follow-up CT which showed a partial response and is now referred for consideration for surgical resection.  She tolerated the chemotherapy well.  She says she is down to smoking 1 or 2 cigarettes a day.  She is not having any chest pain, pressure, tightness, or shortness of breath.  She did not have any significant weight loss with chemotherapy.  Dr. Roxan Frederick discussed with [name] and her husband the treatment options at this point they understand the traditional standard therapy for stage IIIa disease is chemoradiation.  They understand there is a select group of patients with stage IIIa disease that may benefit from surgical resection.  She would fall into that group with nodal disease limited to a single station that is resectable.  They understand there is no guarantee of a cure with surgery, but likely an improved chance of a long-term survival.  The plan would be for a robotic assisted right upper lobectomy and lymph node dissection. Potential risks, benefits, and complications of the surgery were discussed with the patient and she agreed to proceed with surgery. She underwent a robotic assisted RUL, lymph node dissection, and intercostal nerve block on 10/19/2020.   Brief Hospital Course:  The patient did initially have some postoperative hypotension and was treated with albumin and IV fluids. A line and foley were removed early in post operative course.  Postoperative day 1 was notable for a large air leak and a space on chest x-ray so chest tube is kept to suction.  She is noted to have a mild expected acute blood loss anemia which will be monitored clinically. Chest tube continued to have a  fairly large air leak so suction remained for a few days. Daily chest x rays were obtained and remained stable (small, right pneumothorax). Central line was removed on 02/06. Chest tube was placed to water seal on 02/07. She developed subcutaneous emphysema so chest tube placed back to suction on 02/08. Patient continued with air leak but chest x ray stable. Chest tube was placed to a mini express on 02/11. Follow up chest x ray showed unchanged right pneumothorax and extensive subcutaneous emphysema. As discussed with Dr. Roxy Manns, she was felt surgically stable for discharge on 10/28/2020. HH was arranged to assist with dressing changes etc of mini express.   Latest Vital Signs: Blood pressure 94/73, pulse 61, temperature 98.4 F (36.9 C), temperature source Oral, resp. rate 18, height 5\' 3"  (1.6 m), weight 59.9 kg, SpO2 94 %.  Physical Exam:  Cardiovascular: RRR Pulmonary: Clear to auscultation on the left and coarse on the right with chest tube in place. Subcutaneous emphysema on the right.  Wounds: Clean and dry.  No erythema or signs of infection. Chest Tube: to mini express, air leak  Discharge Condition: Stable and discharged to home.  Recent laboratory studies:  Lab Results  Component Value Date   WBC 8.8 10/26/2020   HGB 10.9 (L) 10/26/2020   HCT 34.8 (L) 10/26/2020   MCV 99.4 10/26/2020   PLT 275 10/26/2020   Lab Results  Component Value Date   NA 136 10/26/2020   K 3.9 10/26/2020   CL 100 10/26/2020   CO2 26 10/26/2020   CREATININE 0.59 10/26/2020   GLUCOSE 94 10/26/2020      Diagnostic Studies: DG Chest 1 View  Result Date: 10/26/2020 CLINICAL DATA:  Status post RIGHT upper lobectomy EXAM: CHEST  1 VIEW COMPARISON:  October 25, 2020 FINDINGS: The cardiomediastinal silhouette is unchanged in contour.Atherosclerotic calcifications of the aorta. RIGHT-sided chest tube. Similar appearance of a small RIGHT apical pneumothorax. Extensive subcutaneous air. No pleural effusion.  No LEFT pneumothorax. LEFT basilar heterogeneous opacities, unchanged and likely atelectasis. RIGHT upper lobe paramediastinal opacity, unchanged. Visualized abdomen is unremarkable. No acute osseous abnormality. IMPRESSION: 1. Stable small RIGHT apical pneumothorax with RIGHT-sided chest tube in place. Electronically Signed   By: Valentino Saxon MD   On: 10/26/2020 07:47   DG Chest 2 View  Result Date: 10/17/2020 CLINICAL DATA:  Lung cancer EXAM: CHEST - 2 VIEW COMPARISON:  08/14/2020, 05/05/2020 FINDINGS: The heart size and mediastinal contours are within normal limits. Known right upper lobe pulmonary nodule is not well seen radiographically. No focal airspace consolidation, pleural effusion, or pneumothorax. The visualized skeletal structures are unremarkable. IMPRESSION: No active cardiopulmonary disease. Electronically Signed   By: Davina Poke D.O.   On: 10/17/2020 15:16   DG Chest Port 1 View  Result Date: 10/28/2020 CLINICAL DATA:  Pneumothorax with chest tube in place EXAM: PORTABLE CHEST 1 VIEW COMPARISON:  October 27, 2020 chest radiograph; chest CT August 14, 2020 FINDINGS: Chest tube position on the right is unchanged. Right sided pneumothorax is essentially stable without tension component. There is extensive subcutaneous air, primarily on the right but also in the left supraclavicular region. There is atelectatic change in the left base. No consolidation. Postoperative change on the right is noted. Heart size is within normal limits. Pulmonary vascularity is mildly distorted on the right and normal on the left. Questionable adenopathy right paratracheal region, better delineated on prior CT. No bone lesions. There is aortic atherosclerosis. IMPRESSION: Chest tube position on the right unchanged with stable pneumothorax on the right. No tension component. Extensive subcutaneous emphysema noted. Left base atelectasis. No edema or airspace opacity. Postoperative change noted on the  right with scarring in the right perihilar region. Stable right paratracheal prominence which may represent lymph node enlargement based on prior CT. Stable cardiac silhouette overall. Aortic Atherosclerosis (ICD10-I70.0). Electronically Signed   By: Lowella Grip III M.D.   On: 10/28/2020 08:00   DG Chest Port 1 View  Result Date: 10/27/2020 CLINICAL DATA:  Pneumothorax. EXAM: PORTABLE CHEST 1 VIEW COMPARISON:  October 26, 2020. FINDINGS: Interval slight increase in a now moderate right pneumothorax. Similar position of a right chest tube with tip projecting at the right medial upper chest. No definite mediastinal shift. No left pneumothorax or sizable pleural effusion. Mild left basilar linear opacities, favor atelectasis. Similar subcutaneous emphysema involving the right greater than left neck and chest wall. Similar cardiomediastinal contour with right paramediastinal prominence likely representing the adenopathy seen on prior CT chest. Calcific atherosclerosis of the aorta. IMPRESSION: 1. Interval slight increase in a now moderate right pneumothorax. Similar position of a right chest  tube. 2. Similar subcutaneous emphysema and pneumomediastinum. 3. Similar mild left basilar linear opacities, favor atelectasis. These results will be called to the ordering clinician or representative by the Radiologist Assistant, and communication documented in the PACS or [employer]. Electronically Signed   By: Margaretha Sheffield MD   On: 10/27/2020 08:05   DG Chest Port 1 View  Result Date: 10/25/2020 CLINICAL DATA:  Status post right thoracotomy with chest tube in place EXAM: PORTABLE CHEST 1 VIEW COMPARISON:  October 24, 2020 FINDINGS: Postoperative change on the right noted with chest tube unchanged in position. Right apical pneumothorax is again noted and may be slightly larger compared to 1 day prior. No tension component. There is extensive subcutaneous air on the right. There is pneumomediastinum  present. There is atelectatic change in the left base. Volume loss right upper lung region is stable. No new opacity. Heart size and pulmonary vascularity appear stable. No adenopathy. No bone lesions IMPRESSION: Postoperative change on the right with chest tube position unchanged. Pneumothorax on the right slightly larger without tension component. Extensive subcutaneous air again noted. Grossly stable pneumomediastinum. Stable left base atelectasis. No new opacity. Stable cardiac silhouette. Electronically Signed   By: Lowella Grip III M.D.   On: 10/25/2020 08:06   DG Chest Port 1 View  Result Date: 10/24/2020 CLINICAL DATA:  Recent lobectomy for lung carcinoma.  Air leak. EXAM: PORTABLE CHEST 1 VIEW COMPARISON:  October 23, 2020 FINDINGS: Chest tube remains on right. Right apical pneumothorax is stable without tension component. Extensive subcutaneous air is noted, increased from 1 day prior. There is also pneumomediastinum currently present. There is atelectatic change in the left base. A postoperative change noted on the right with focal volume loss in the right perihilar region. Heart size is normal. Pulmonary vascularity mildly distorted on the right, stable, and normal on the left. No adenopathy appreciable. No bone lesions. IMPRESSION: Small right pneumothorax remains with no change in right chest tube positioning. Extensive subcutaneous air, progressed from 1 day prior as well as pneumomediastinum currently present. Postoperative changes again noted on the right. Atelectatic change left base. Stable cardiac silhouette. Electronically Signed   By: Lowella Grip III M.D.   On: 10/24/2020 08:04   DG Chest Port 1 View  Result Date: 10/23/2020 CLINICAL DATA:  70 year old female postoperative day 4 status post right upper lobectomy and lymph node dissection for non-small cell carcinoma. Air leak. EXAM: PORTABLE CHEST 1 VIEW COMPARISON:  Portable chest 10/22/2020 and earlier. FINDINGS: Portable AP  semi upright view at 0308 hours. Right IJ central line has been removed. Right chest tube is stable. Small right pneumothorax is stable. Right chest wall gas is stable. Stable mildly low lung volumes. Stable cardiac size and mediastinal contours. Bilateral ventilation is stable. No new pulmonary opacity. Paucity of gas in the upper abdomen. Stable visualized osseous structures. IMPRESSION: 1. Right IJ central line removed. 2. Stable right chest tube and small right pneumothorax. 3. No new cardiopulmonary abnormality. Electronically Signed   By: Genevie Ann M.D.   On: 10/23/2020 04:01   DG Chest Port 1 View  Result Date: 10/22/2020 CLINICAL DATA:  70 year old female postoperative day 3 status post right upper lobectomy, lymph node dissection for non-small cell carcinoma. Air leak. EXAM: PORTABLE CHEST 1 VIEW COMPARISON:  Portable chest 10/21/2020 and earlier. FINDINGS: Portable AP semi upright view at 0419 hours. Stable right chest tube and right IJ central line. Small right side pneumothorax has decreased. Residual right lung ventilation appears satisfactory.  Mild bilateral lung base atelectasis suspected. Stable cardiac size and mediastinal contours. Moderate right chest wall soft tissue gas is stable. Stable visualized osseous structures. Negative visible bowel gas pattern. IMPRESSION: 1. Stable right chest tube and right IJ central line. 2. Small right side pneumothorax following upper lobectomy has decreased. Stable right chest wall gas. 3. Mild atelectasis. Electronically Signed   By: Genevie Ann M.D.   On: 10/22/2020 07:59   DG CHEST PORT 1 VIEW  Result Date: 10/21/2020 CLINICAL DATA:  Pneumothorax. EXAM: PORTABLE CHEST 1 VIEW COMPARISON:  One-view chest x-ray 10/20/2020 FINDINGS: Heart size normal.  Right IJ line is stable. Right-sided chest tube is in place. Right apical pneumothorax is decreasing, now approximately 20%. Subcutaneous emphysema has increased. Bilateral pleural effusions are new. Bibasilar  atelectasis is noted. IMPRESSION: 1. Decreasing right apical pneumothorax, now approximately 20%. 2. Increasing subcutaneous emphysema. 3. New bilateral pleural effusions. 4. Bibasilar atelectasis. Electronically Signed   By: San Morelle M.D.   On: 10/21/2020 07:02   DG CHEST PORT 1 VIEW  Result Date: 10/20/2020 CLINICAL DATA:  Pneumothorax EXAM: PORTABLE CHEST 1 VIEW COMPARISON:  Radiograph 10/19/2020 FINDINGS: Right apically directed chest tube remains in place. There is slight apparent decrease in the size of the right apical pneumothorax though this may in part be due to changes in patient positioning. Some right perihilar opacity is again seen. May reflect some atelectatic change given the size of the pneumothorax component. Left lung is relatively clear aside from some streaky basilar atelectatic changes. Cardiomediastinal contours are stable. Right IJ catheter tip terminates in the mid SVC. Additional telemetry leads and support devices overlie the chest. Small amount of subcutaneous emphysema over the right chest wall near the chest tube insertion site. IMPRESSION: 1. Right apically directed chest tube remains in place. Some apparent decrease in the size of the right apical pneumothorax though may in part be due to differences in patient positioning. 2. Some right perihilar opacity could reflect atelectatic changes related to the partial collapse the right lung. 3. Stable positioning of a right IJ approach central venous catheter. Electronically Signed   By: Lovena Le M.D.   On: 10/20/2020 04:06   DG Chest Port 1 View  Result Date: 10/19/2020 CLINICAL DATA:  Postoperative right upper lobectomy EXAM: PORTABLE CHEST 1 VIEW COMPARISON:  10/17/2020 FINDINGS: Normal heart size. No shift of the heart or mediastinal structures. Large right-sided pneumothorax, approximately 50% volume. A right chest tube is in place with distal tip terminating near the right lung apex. Interval right IJ approach  central venous catheter with distal tip terminating at the level of the distal SVC. Left lung is clear. No left-sided pneumothorax. IMPRESSION: 1. Large right-sided pneumothorax, approximately 50% volume. No mediastinal shift. Right-sided chest tube is in place. 2. Right IJ approach central venous catheter tip terminates at the level of the distal SVC. Electronically Signed   By: Davina Poke D.O.   On: 10/19/2020 13:22    Discharge Medications: Allergies as of 10/28/2020   No Known Allergies     Medication List    TAKE these medications   albuterol 108 (90 Base) MCG/ACT inhaler Commonly known as: VENTOLIN HFA Inhale 2 puffs into the lungs every 4 (four) hours as needed for wheezing or shortness of breath (cough, shortness of breath or wheezing.).   CALCIUM 600 PO Take 600 mg by mouth 2 (two) times daily.   fluticasone 50 MCG/ACT nasal spray Commonly known as: FLONASE Place 2 sprays into both nostrils  daily.   Fluticasone-Salmeterol 250-50 MCG/DOSE Aepb Commonly known as: ADVAIR Inhale 1 puff into the lungs 2 (two) times daily.   guaiFENesin 600 MG 12 hr tablet Commonly known as: MUCINEX Take 1 tablet (600 mg total) by mouth 2 (two) times daily as needed for cough or to loosen phlegm.   multivitamin tablet Take 1 tablet by mouth daily.   oxyCODONE 5 MG immediate release tablet Commonly known as: Oxy IR/ROXICODONE Take 1 tablet (5 mg total) by mouth every 4 (four) hours as needed for moderate pain or severe pain.   prochlorperazine 10 MG tablet Commonly known as: COMPAZINE Take 1 tablet (10 mg total) by mouth every 6 (six) hours as needed. What changed: reasons to take this   VITAMIN D3 PO Take 2,000 Units by mouth daily.       Follow Up Appointments:  Follow-up Information    [name], MD. Go on 11/07/2020.   Specialty: Cardiothoracic Surgery Why: PA/LAT CXR to be taken (at [facility] which is in the same building as Dr. Leonarda Salon office)  on 02/22 at 10:30 am;Appointment time is at 11:00 am Contact information: [place] [address] [phone]        Care, [facility] Follow up.   Specialty: Home Health Services Why: [facility] Contact information: [address] [place] [address] [phone]               Signed: Sharalyn Ink [facility] 10/28/2020, 1:32 PM

## 2020-10-23 NOTE — Plan of Care (Signed)
  Problem: Education: Goal: Knowledge of General Education information will improve Description Including pain rating scale, medication(s)/side effects and non-pharmacologic comfort measures Outcome: Progressing   

## 2020-10-23 NOTE — Progress Notes (Addendum)
[address]       [employer] [address]             [phone]      4 Days Post-Op Procedure(s) (LRB): XI ROBOTIC ASSISTED THORASCOPY- RIGHT UPPER LOBECTOMY (Right) INTERCOSTAL NERVE BLOCK (Right) NODE DISSECTION (Right) Subjective: Feels ok, no specific c/o  Objective: Vital signs in last 24 hours: Temp:  [97.6 F (36.4 C)-98.3 F (36.8 C)] 97.7 F (36.5 C) (02/07 0400) Pulse Rate:  [59-60] 60 (02/06 2312) Cardiac Rhythm: Sinus bradycardia (02/07 0700) Resp:  [16-20] 17 (02/06 2312) BP: (85-119)/(50-62) 115/62 (02/06 2312) SpO2:  [95 %-97 %] 96 % (02/06 2312)  Hemodynamic parameters for last 24 hours:    Intake/Output from previous day: 02/06 0701 - 02/07 0700 In: 919.2 [P.O.:720; I.V.:199.2] Out: 540 [Urine:300; Chest Tube:240] Intake/Output this shift: No intake/output data recorded.  General appearance: alert, cooperative and no distress Heart: regular rate and rhythm Lungs: coarse in right base Abdomen: benign Extremities: no edema or calf tenderness Wound: incis healing well  Lab Results: Recent Labs    10/21/20 0047 10/23/20 0546  WBC 7.7 7.9  HGB 10.3* 11.8*  HCT 33.0* 36.0  PLT 209 243   BMET:  Recent Labs    10/21/20 0047 10/23/20 0546  NA 136 138  K 3.9 3.9  CL 104 105  CO2 26 25  GLUCOSE 97 85  BUN 9 8  CREATININE 0.64 0.56  CALCIUM 7.9* 8.3*    PT/INR: No results for input(s): LABPROT, INR in the last 72 hours. ABG    Component Value Date/Time   PHART 7.391 10/19/2020 1008   HCO3 26.6 10/19/2020 1008   TCO2 28 10/19/2020 1008   O2SAT 98.0 10/19/2020 1008   CBG (last 3)  No results for input(s): GLUCAP in the last 72 hours.  Meds Scheduled Meds: . acetaminophen  1,000 mg Oral Q6H   Or  . acetaminophen (TYLENOL) oral liquid 160 mg/5 mL  1,000 mg Oral Q6H  . bisacodyl  10 mg Oral Daily  . Chlorhexidine Gluconate Cloth  6 each Topical Daily  . enoxaparin (LOVENOX) injection  40 mg Subcutaneous Q24H   . fluticasone  2 spray Each Nare Daily  . guaiFENesin  600 mg Oral BID  . mometasone-formoterol  2 puff Inhalation BID  . multivitamin with minerals  1 tablet Oral Daily  . senna-docusate  1 tablet Oral QHS   Continuous Infusions: . 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 Air leak. EXAM: PORTABLE CHEST 1 VIEW COMPARISON:  Portable chest 10/22/2020 and earlier. FINDINGS: Portable AP semi upright view at 0308 hours. Right IJ central line has been removed. Right chest tube is stable. Small right pneumothorax is stable. Right chest wall gas is stable. Stable mildly low lung volumes. Stable cardiac size and mediastinal contours. Bilateral ventilation is stable. No new pulmonary opacity. Paucity of gas in the upper abdomen. Stable visualized osseous structures. IMPRESSION: 1. Right IJ central line removed. 2. Stable right chest tube and small right pneumothorax. 3. No new cardiopulmonary abnormality. Electronically Signed   By: Genevie Ann M.D.   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 Air leak. EXAM: PORTABLE CHEST 1 VIEW COMPARISON:  Portable chest 10/21/2020 and earlier. FINDINGS: Portable AP semi upright view at 0419 hours. Stable right chest tube and right IJ central line. Small right side pneumothorax has decreased. Residual right lung ventilation appears satisfactory. Mild bilateral lung base atelectasis suspected. Stable cardiac size and mediastinal contours. Moderate right chest wall soft tissue gas is stable. Stable visualized osseous structures.  Negative visible bowel gas pattern. IMPRESSION: 1. Stable right chest tube and right IJ central line. 2. Small right side pneumothorax following upper lobectomy has decreased. Stable right chest wall gas. 3. Mild atelectasis. Electronically Signed   By: Genevie Ann M.D.   On: 10/22/2020 07:59    Assessment/Plan: S/P Procedure(s) (LRB): XI ROBOTIC ASSISTED THORASCOPY- RIGHT UPPER LOBECTOMY (Right) INTERCOSTAL NERVE BLOCK (Right) NODE DISSECTION (Right)  1 afeb , VSS, some lower BP but mostly better , sinus rhythm 2 sats ok on RA- 2 liters 3 H/H improved 4 normal renal fxn 5 CXR is stable, small space/pntx 6 + air leak, moderate- leave tube to suction for now 7 routine pulm toilet/rehab 8 lovenox for DVT ppx     LOS: 4 days    Christina Giovanni PA-C Pager [phone] 10/23/2020 Still has air leak, decreased from last Friday- will try on water seal  Christina Ruble C. Roxan Hockey, MD [facility] [phone]

## 2020-10-23 NOTE — Care Management Important Message (Signed)
Important Message  Patient Details  Name: [name] MRN: [id number] Date of Birth: [birth year]   Medicare Important Message Given:  Yes     Orbie Pyo 10/23/2020, 2:52 PM

## 2020-10-24 ENCOUNTER — Inpatient Hospital Stay (HOSPITAL_COMMUNITY): Payer: Medicare Other

## 2020-10-24 LAB — SURGICAL PATHOLOGY

## 2020-10-24 IMAGING — DX DG CHEST 1V PORT
1 series · 1 of 1 positions shown · non-contrast
Comparison: [DATE]

CLINICAL DATA: Recent lobectomy for lung carcinoma.  Air leak.

EXAM:
PORTABLE CHEST 1 VIEW

[chest ap]
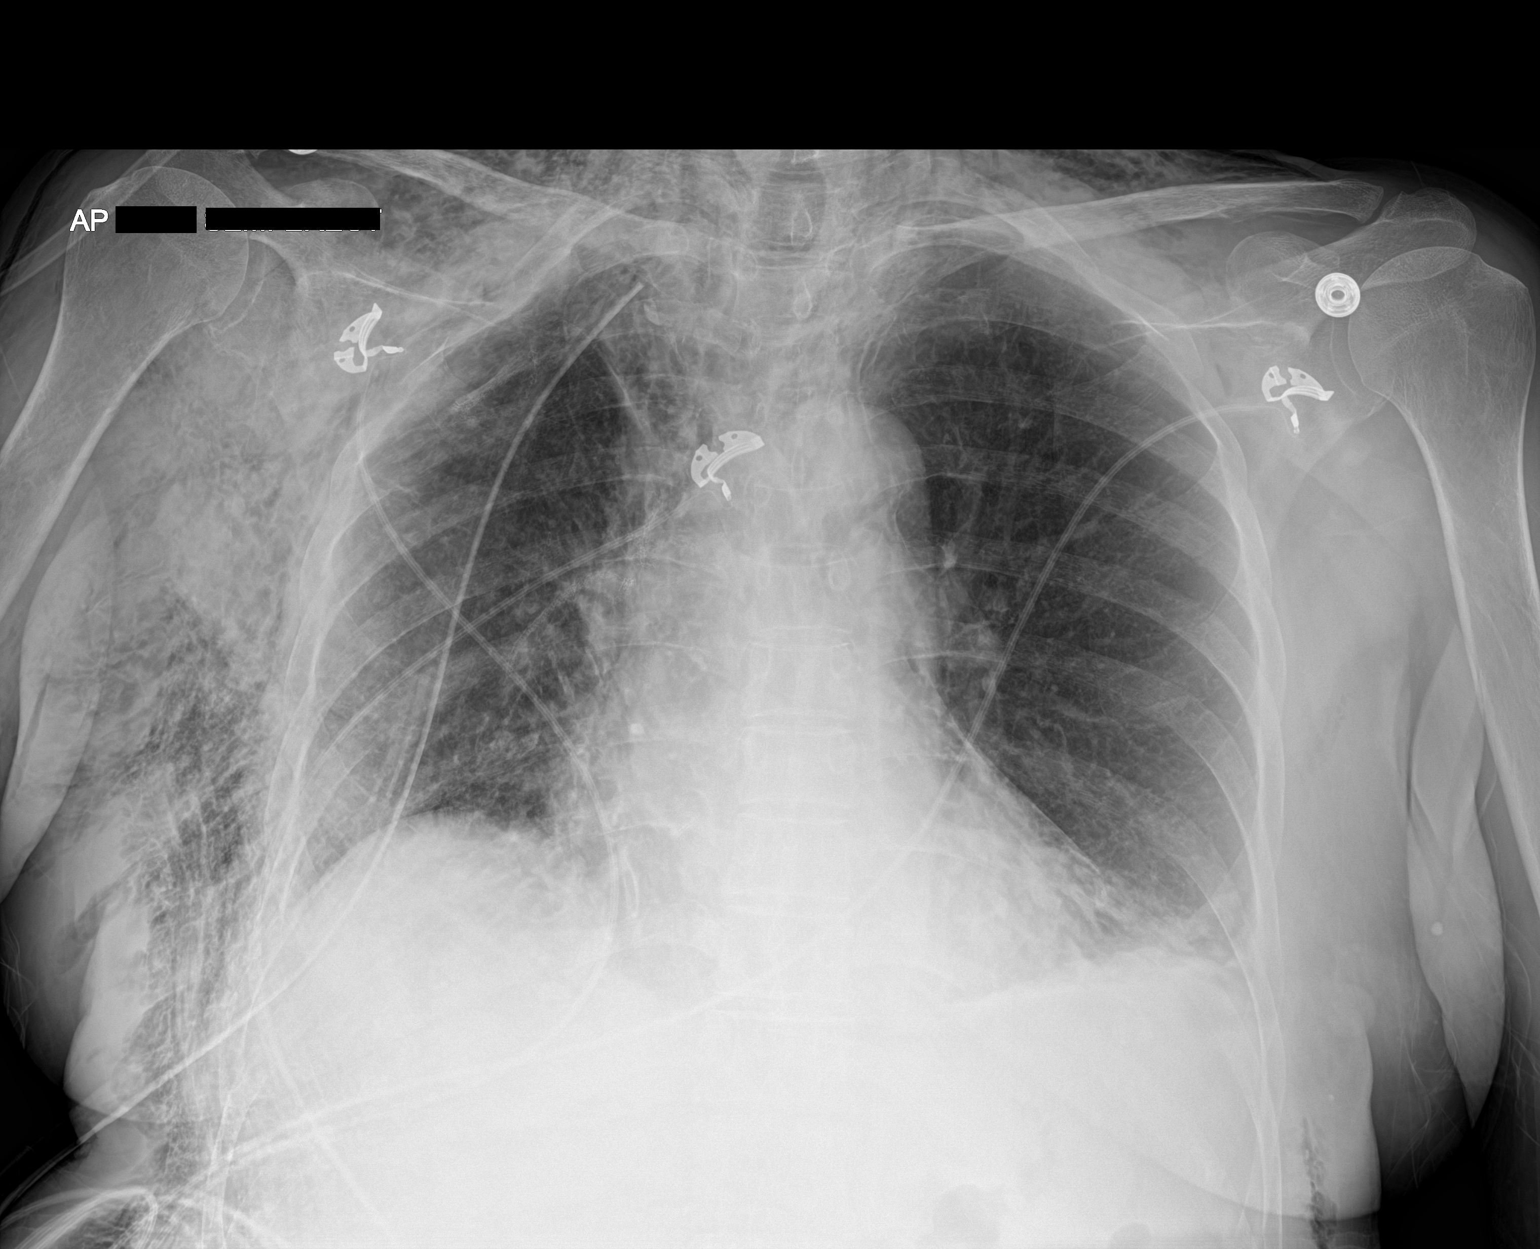

[1 of 1 positions shown; findings below may reference images not displayed]

FINDINGS: Chest tube remains on right. Right apical pneumothorax is stable
without tension component. Extensive subcutaneous air is noted,
increased from 1 day prior. There is also pneumomediastinum
currently present.

There is atelectatic change in the left base. A postoperative change
noted on the right with focal volume loss in the right perihilar
region. Heart size is normal. Pulmonary vascularity mildly distorted
on the right, stable, and normal on the left. No adenopathy
appreciable. No bone lesions.
IMPRESSION: Small right pneumothorax remains with no change in right chest tube
positioning. Extensive subcutaneous air, progressed from 1 day prior
as well as pneumomediastinum currently present.

Postoperative changes again noted on the right. Atelectatic change
left base. Stable cardiac silhouette.

## 2020-10-24 NOTE — Plan of Care (Signed)

## 2020-10-24 NOTE — Progress Notes (Addendum)
[place] [address]       [employer] [address]             [phone]      5 Days Post-Op Procedure(s) (LRB): XI ROBOTIC ASSISTED THORASCOPY- RIGHT UPPER LOBECTOMY (Right) INTERCOSTAL NERVE BLOCK (Right) NODE DISSECTION (Right) Subjective: Feels ok overall, not SOB  Objective: Vital signs in last 24 hours: Temp:  [97.8 F (36.6 C)-98.7 F (37.1 C)] 97.8 F (36.6 C) (02/08 0501) Pulse Rate:  [66-98] 68 (02/08 0501) Cardiac Rhythm: Normal sinus rhythm (02/07 2043) Resp:  [17-22] 17 (02/08 0501) BP: (115-138)/(58-87) 115/68 (02/08 0501) SpO2:  [96 %-100 %] 99 % (02/08 0501)  Hemodynamic parameters for last 24 hours:    Intake/Output from previous day: 02/07 0701 - 02/08 0700 In: 868.3 [P.O.:660; I.V.:208.3] Out: 776 [Urine:400; Chest Tube:376] Intake/Output this shift: No intake/output data recorded.  General appearance: alert, cooperative and no distress Heart: regular rate and rhythm Lungs: coarse on right, Sub q air noted in chest and some into face Abdomen: benign Extremities: no edema or calf tenderness Wound: incis healing well  Lab Results: Recent Labs    10/23/20 0546  WBC 7.9  HGB 11.8*  HCT 36.0  PLT 243   BMET:  Recent Labs    10/23/20 0546  NA 138  K 3.9  CL 105  CO2 25  GLUCOSE 85  BUN 8  CREATININE 0.56  CALCIUM 8.3*    PT/INR: No results for input(s): LABPROT, INR in the last 72 hours. ABG    Component Value Date/Time   PHART 7.391 10/19/2020 1008   HCO3 26.6 10/19/2020 1008   TCO2 28 10/19/2020 1008   O2SAT 98.0 10/19/2020 1008   CBG (last 3)  No results for input(s): GLUCAP in the last 72 hours.  Meds Scheduled Meds: . acetaminophen  1,000 mg Oral Q6H   Or  . acetaminophen (TYLENOL) oral liquid 160 mg/5 mL  1,000 mg Oral Q6H  . bisacodyl  10 mg Oral Daily  . enoxaparin (LOVENOX) injection  40 mg Subcutaneous Q24H  . fluticasone  2 spray Each Nare Daily  . guaiFENesin  600 mg Oral BID  .  mometasone-formoterol  2 puff Inhalation BID  . multivitamin with minerals  1 tablet Oral Daily  . senna-docusate  1 tablet Oral QHS   Continuous Infusions: . 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 Air leak. EXAM: PORTABLE CHEST 1 VIEW COMPARISON:  Portable chest 10/22/2020 and earlier. FINDINGS: Portable AP semi upright view at 0308 hours. Right IJ central line has been removed. Right chest tube is stable. Small right pneumothorax is stable. Right chest wall gas is stable. Stable mildly low lung volumes. Stable cardiac size and mediastinal contours. Bilateral ventilation is stable. No new pulmonary opacity. Paucity of gas in the upper abdomen. Stable visualized osseous structures. IMPRESSION: 1. Right IJ central line removed. 2. Stable right chest tube and small right pneumothorax. 3. No new cardiopulmonary abnormality. Electronically Signed   By: Genevie Ann M.D.   On: 10/23/2020 04:01    Assessment/Plan: S/P Procedure(s) (LRB): XI ROBOTIC ASSISTED THORASCOPY- RIGHT UPPER LOBECTOMY (Right) INTERCOSTAL NERVE BLOCK (Right) NODE DISSECTION (Right)  1 afeb, VSS 2 sats  good on @ liters 3 CXR - now with increased Sub q air, / small pntx- back on 20 cm H2O suction- started with a 'coughing fit". 1 chamber air leak 4 CT 376 cc recorded/24 hours 5 no new labs 6 pulm toilet/rehab- routine    LOS: 5 days    John Giovanni PA-C Pager [phone] 10/24/2020 Patient seen and examined, events from yesterday PM noted, agree with above Suspect tube was kinked when she coughed Will try on 10 cm of suction  Remo Lipps C. Roxan Hockey, MD [facility] [phone]

## 2020-10-24 NOTE — Plan of Care (Signed)
  Problem: Education: Goal: Knowledge of General Education information will improve Description: Including pain rating scale, medication(s)/side effects and non-pharmacologic comfort measures Outcome: Progressing   Problem: Health Behavior/Discharge Planning: Goal: Ability to manage health-related needs will improve Outcome: Progressing   Problem: Clinical Measurements: Goal: Will remain free from infection Outcome: Progressing Goal: Diagnostic test results will improve Outcome: Progressing Goal: Respiratory complications will improve Outcome: Progressing Goal: Cardiovascular complication will be avoided Outcome: Progressing   Problem: Nutrition: Goal: Adequate nutrition will be maintained Outcome: Progressing   Problem: Coping: Goal: Level of anxiety will decrease Outcome: Progressing   Problem: Pain Managment: Goal: General experience of comfort will improve Outcome: Progressing   Problem: Skin Integrity: Goal: Risk for impaired skin integrity will decrease Outcome: Progressing   Problem: Activity: Goal: Risk for activity intolerance will decrease Outcome: Progressing   Problem: Cardiac: Goal: Will achieve and/or maintain hemodynamic stability Outcome: Progressing

## 2020-10-25 ENCOUNTER — Inpatient Hospital Stay (HOSPITAL_COMMUNITY): Payer: Medicare Other

## 2020-10-25 IMAGING — DX DG CHEST 1V PORT
1 series · 1 of 1 positions shown · non-contrast
Comparison: [DATE]

CLINICAL DATA: Status post right thoracotomy with chest tube in
place

EXAM:
PORTABLE CHEST 1 VIEW

[chest]
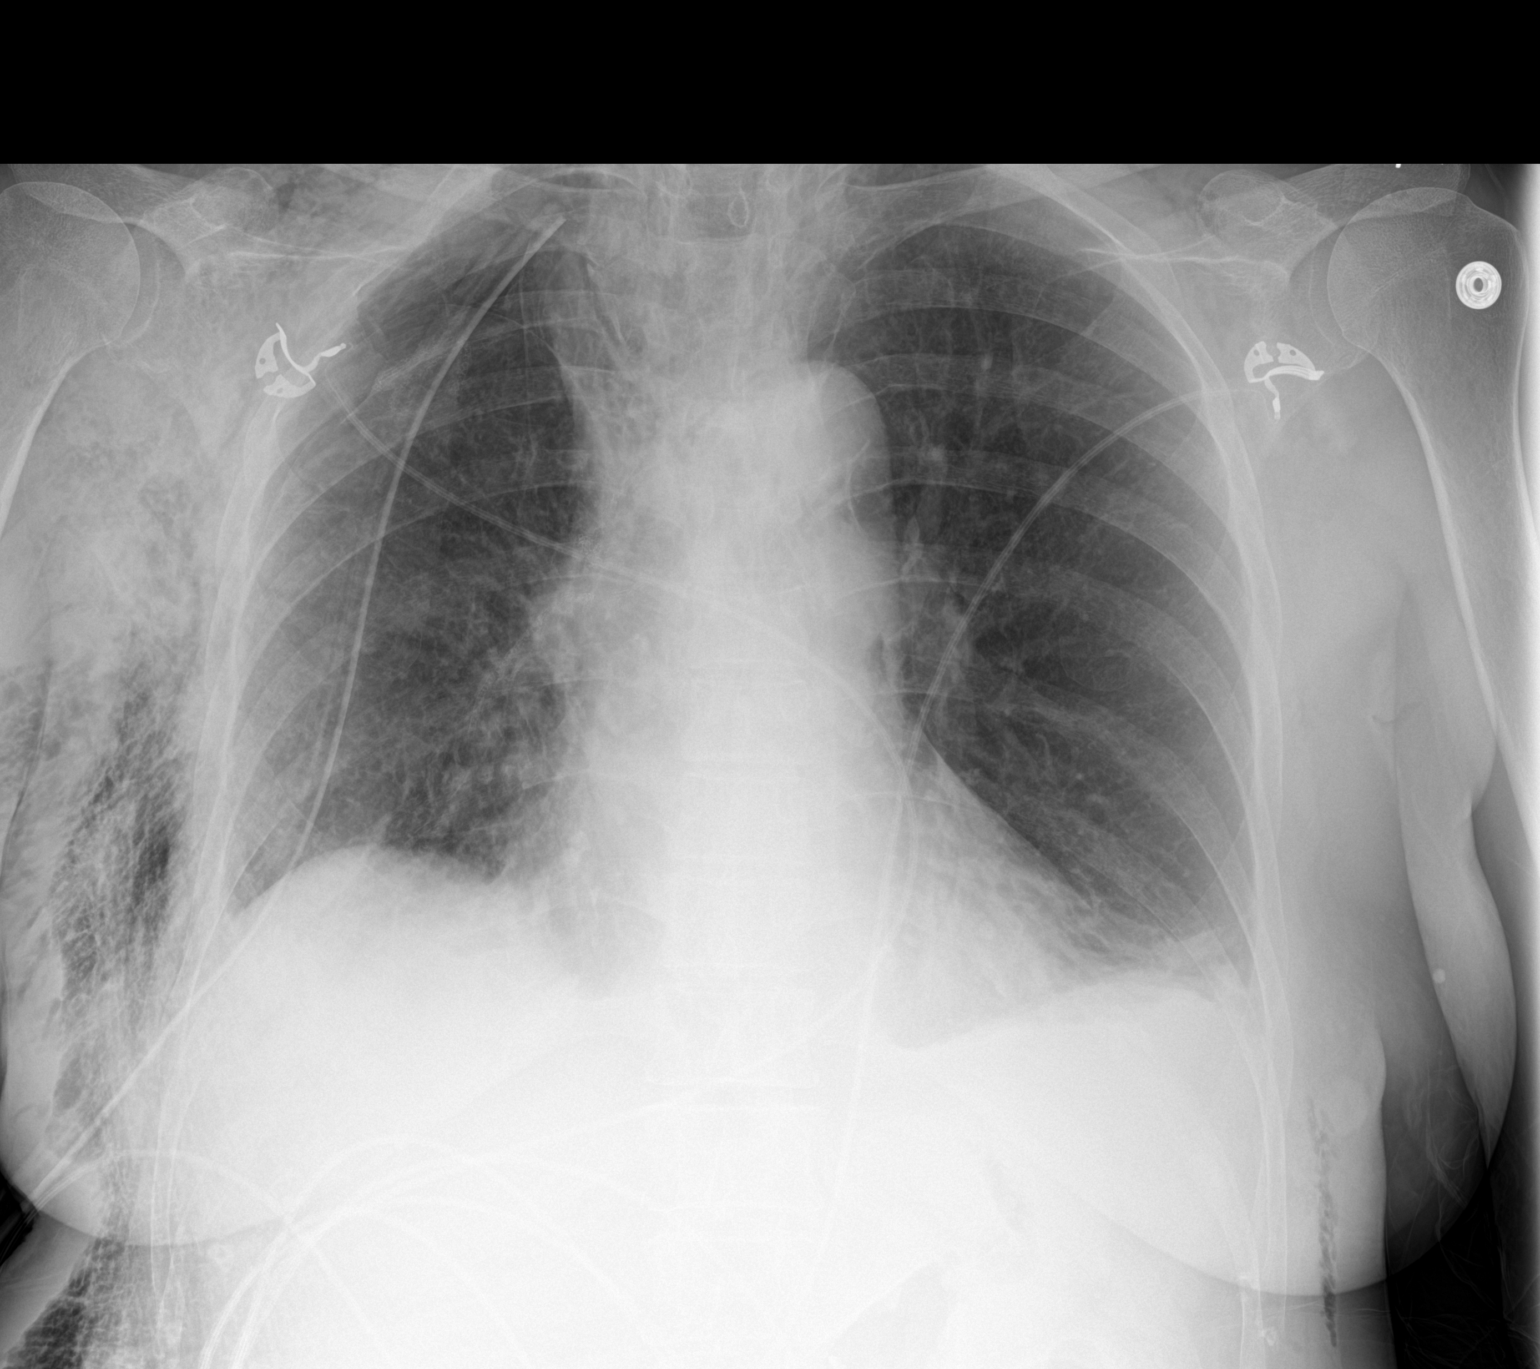

[1 of 1 positions shown; findings below may reference images not displayed]

FINDINGS: Postoperative change on the right noted with chest tube unchanged in
position. Right apical pneumothorax is again noted and may be
slightly larger compared to 1 day prior. No tension component. There
is extensive subcutaneous air on the right. There is
pneumomediastinum present.

There is atelectatic change in the left base. Volume loss right
upper lung region is stable. No new opacity. Heart size and
pulmonary vascularity appear stable. No adenopathy. No bone lesions
IMPRESSION: Postoperative change on the right with chest tube position
unchanged. Pneumothorax on the right slightly larger without tension
component. Extensive subcutaneous air again noted. Grossly stable
pneumomediastinum. Stable left base atelectasis. No new opacity.
Stable cardiac silhouette.

## 2020-10-25 MED ORDER — ORAL CARE MOUTH RINSE
15.0000 mL | Freq: Two times a day (BID) | OROMUCOSAL | Status: DC
  Administered 2020-10-25 – 2020-10-28 (×6): 15 mL via OROMUCOSAL

## 2020-10-25 NOTE — Progress Notes (Addendum)
[address]       [employer] [address]             [phone]      6 Days Post-Op Procedure(s) (LRB): XI ROBOTIC ASSISTED THORASCOPY- RIGHT UPPER LOBECTOMY (Right) INTERCOSTAL NERVE BLOCK (Right) NODE DISSECTION (Right) Subjective: Feels fine  Objective: Vital signs in last 24 hours: Temp:  [98 F (36.7 C)-98.4 F (36.9 C)] 98.4 F (36.9 C) (02/09 0422) Pulse Rate:  [63-72] 63 (02/09 0422) Cardiac Rhythm: Normal sinus rhythm (02/09 0700) Resp:  [13-19] 13 (02/09 0422) BP: (102-117)/(57-66) 112/62 (02/09 0422) SpO2:  [93 %-99 %] 99 % (02/09 0422)  Hemodynamic parameters for last 24 hours:    Intake/Output from previous day: 02/08 0701 - 02/09 0700 In: 298.5 [I.V.:298.5] Out: 820 [Urine:750; Chest Tube:70] Intake/Output this shift: No intake/output data recorded.  General appearance: alert, cooperative and no distress Heart: regular rate and rhythm Lungs: slight coarse on right, + sub q air Abdomen: benign Extremities: no edema or calf tenderness Wound: incis healing well  Lab Results: Recent Labs    10/23/20 0546  WBC 7.9  HGB 11.8*  HCT 36.0  PLT 243   BMET:  Recent Labs    10/23/20 0546  NA 138  K 3.9  CL 105  CO2 25  GLUCOSE 85  BUN 8  CREATININE 0.56  CALCIUM 8.3*    PT/INR: No results for input(s): LABPROT, INR in the last 72 hours. ABG    Component Value Date/Time   PHART 7.391 10/19/2020 1008   HCO3 26.6 10/19/2020 1008   TCO2 28 10/19/2020 1008   O2SAT 98.0 10/19/2020 1008   CBG (last 3)  No results for input(s): GLUCAP in the last 72 hours.  Meds Scheduled Meds: . bisacodyl  10 mg Oral Daily  . enoxaparin (LOVENOX) injection  40 mg Subcutaneous Q24H  . fluticasone  2 spray Each Nare Daily  . guaiFENesin  600 mg Oral BID  . mouth rinse  15 mL Mouth Rinse BID  . mometasone-formoterol  2 puff Inhalation BID  . multivitamin with minerals  1 tablet Oral Daily  . senna-docusate  1 tablet Oral QHS    Continuous Infusions: . 0.9 % NaCl with KCl 20 mEq / L 10 mL/hr at 10/25/20 0000   PRN Meds:.levalbuterol, ondansetron (ZOFRAN) IV, oxyCODONE, traMADol  Xrays DG Chest Port 1 View  Result Date: 10/24/2020 CLINICAL DATA:  Recent lobectomy for lung carcinoma.  Air leak. EXAM: PORTABLE CHEST 1 VIEW COMPARISON:  October 23, 2020 FINDINGS: Chest tube remains on right. Right apical pneumothorax is stable without tension component. Extensive subcutaneous air is noted, increased from 1 day prior. There is also pneumomediastinum currently present. There is atelectatic change in the left base. A postoperative change noted on the right with focal volume loss in the right perihilar region. Heart size is normal. Pulmonary vascularity mildly distorted on the right, stable, and normal on the left. No adenopathy appreciable. No bone lesions. IMPRESSION: Small right pneumothorax remains with no change in right chest tube positioning. Extensive subcutaneous air, progressed from 1 day prior as well as pneumomediastinum currently present. Postoperative changes again noted on the right. Atelectatic change left base. Stable cardiac silhouette. Electronically Signed   By: Lowella Grip III M.D.   On: 10/24/2020 08:04    Assessment/Plan: S/P Procedure(s) (LRB): XI ROBOTIC ASSISTED THORASCOPY- RIGHT UPPER LOBECTOMY (Right) INTERCOSTAL NERVE BLOCK (Right) NODE DISSECTION (Right)   1 afeb, VSS 2 sats good  on 2 liters 3 CXR, ? right apical space, sub q air appears stable 4 no new labs 5 CT 70 cc yesterday, mod air leak, cont suction 6 lovenox for DVT ppx 7 routine pulm toilet/rehab 8 f/u CXR and labs in am     LOS: 6 days    Christina Giovanni PA-C Pager [phone] 10/25/2020 Patient seen and examined, agree with above Still has an air leak- about the same, CXR unchanged Path- T1N2 adenocarcinoma  Remo Lipps C. Roxan Hockey, MD [facility] [phone]

## 2020-10-25 NOTE — Plan of Care (Signed)

## 2020-10-26 ENCOUNTER — Inpatient Hospital Stay (HOSPITAL_COMMUNITY): Payer: Medicare Other

## 2020-10-26 LAB — CBC
HCT: 34.8 % — ABNORMAL LOW (ref 36.0–46.0)
Hemoglobin: 10.9 g/dL — ABNORMAL LOW (ref 12.0–15.0)
MCH: 31.1 pg (ref 26.0–34.0)
MCHC: 31.3 g/dL (ref 30.0–36.0)
MCV: 99.4 fL (ref 80.0–100.0)
Platelets: 275 10*3/uL (ref 150–400)
RBC: 3.5 MIL/uL — ABNORMAL LOW (ref 3.87–5.11)
RDW: 12.3 % (ref 11.5–15.5)
WBC: 8.8 10*3/uL (ref 4.0–10.5)
nRBC: 0 % (ref 0.0–0.2)

## 2020-10-26 LAB — BASIC METABOLIC PANEL
Anion gap: 10 (ref 5–15)
BUN: 8 mg/dL (ref 8–23)
CO2: 26 mmol/L (ref 22–32)
Calcium: 8.2 mg/dL — ABNORMAL LOW (ref 8.9–10.3)
Chloride: 100 mmol/L (ref 98–111)
Creatinine, Ser: 0.59 mg/dL (ref 0.44–1.00)
GFR, Estimated: >60 mL/min (ref >60)
Glucose, Bld: 94 mg/dL (ref 70–99)
Potassium: 3.9 mmol/L (ref 3.5–5.1)
Sodium: 136 mmol/L (ref 135–145)

## 2020-10-26 IMAGING — DX DG CHEST 1V
1 series · 1 of 1 positions shown · non-contrast
Comparison: [DATE]

CLINICAL DATA: Status post RIGHT upper lobectomy

EXAM:
CHEST  1 VIEW

[chest ap]
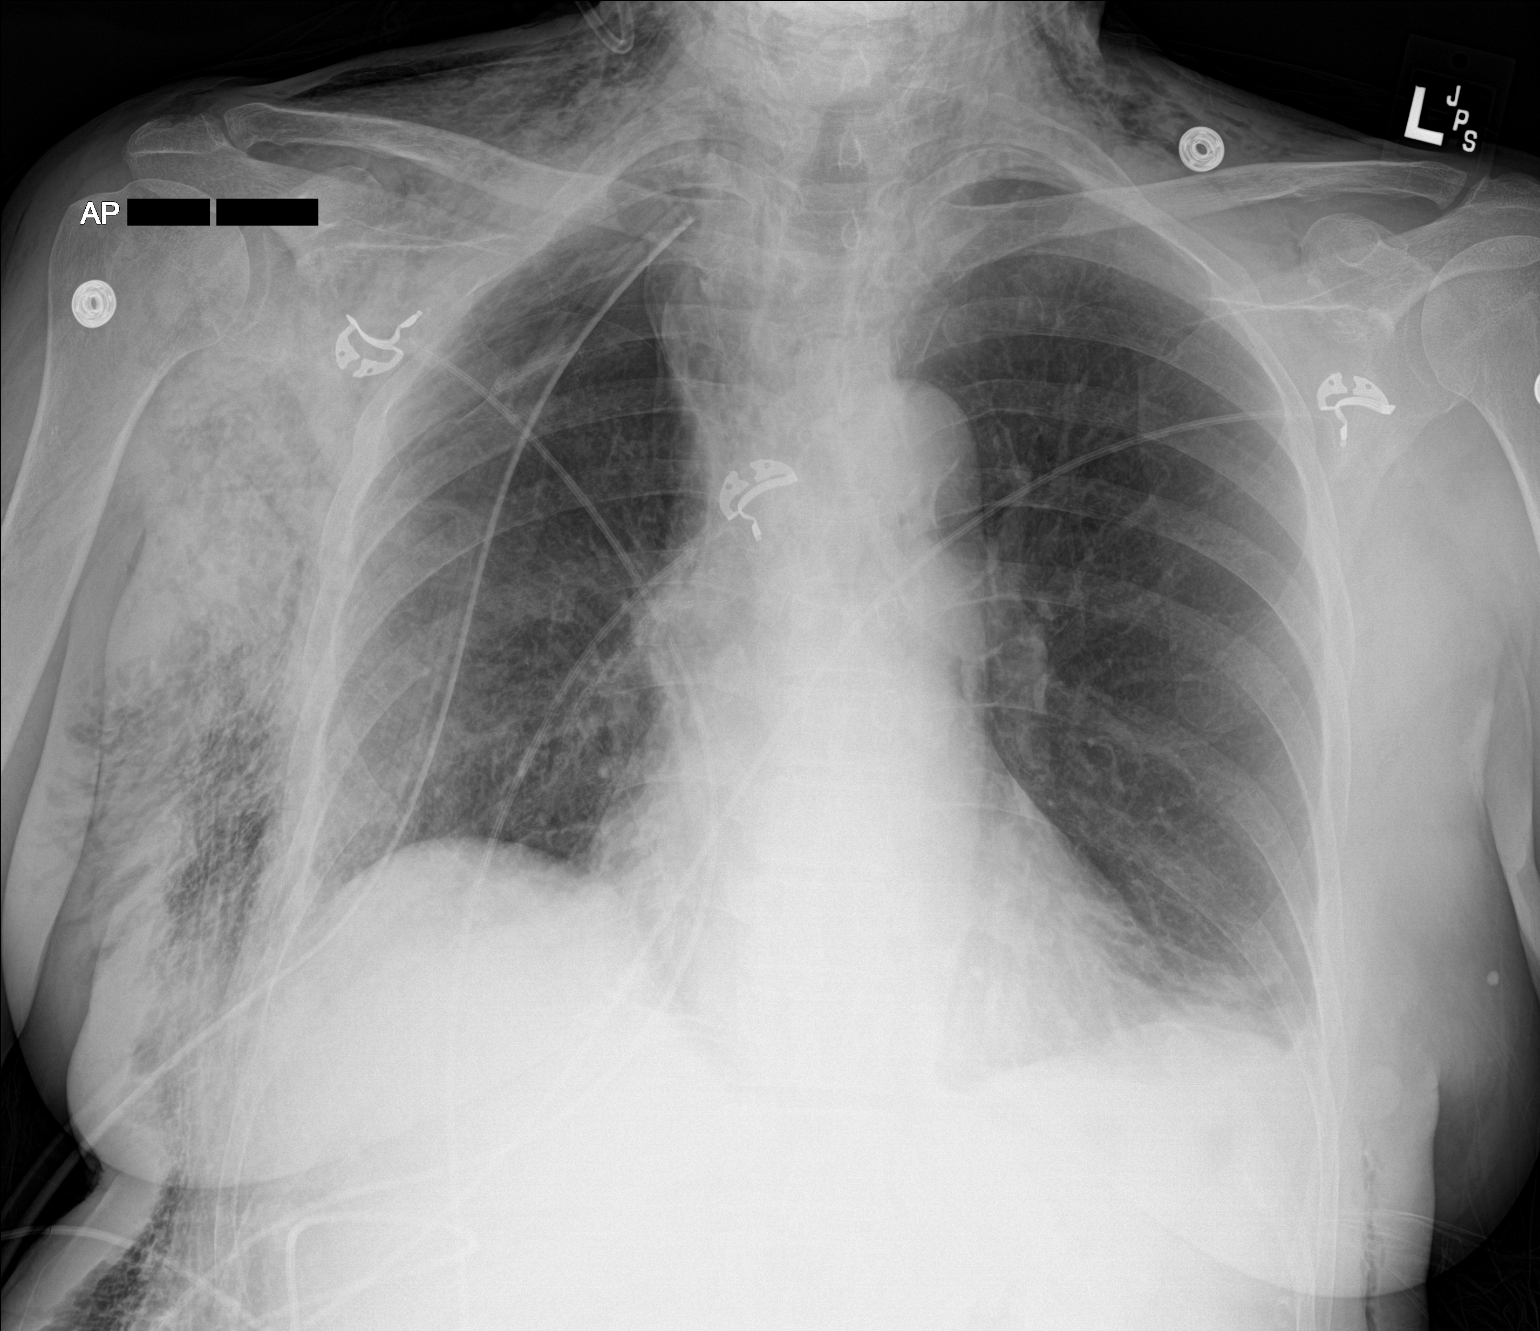

[1 of 1 positions shown; findings below may reference images not displayed]

FINDINGS: The cardiomediastinal silhouette is unchanged in
contour.Atherosclerotic calcifications of the aorta. RIGHT-sided
chest tube. Similar appearance of a small RIGHT apical pneumothorax.
Extensive subcutaneous air. No pleural effusion. No LEFT
pneumothorax. LEFT basilar heterogeneous opacities, unchanged and
likely atelectasis. RIGHT upper lobe paramediastinal opacity,
unchanged. Visualized abdomen is unremarkable. No acute osseous
abnormality.
IMPRESSION: 1. Stable small RIGHT apical pneumothorax with RIGHT-sided chest
tube in place.

## 2020-10-26 NOTE — Plan of Care (Signed)
  Problem: Clinical Measurements: Goal: Ability to maintain clinical measurements within normal limits will improve Outcome: Progressing Goal: Will remain free from infection Outcome: Progressing Goal: Respiratory complications will improve Outcome: Progressing Goal: Cardiovascular complication will be avoided Outcome: Progressing   Problem: Activity: Goal: Risk for activity intolerance will decrease Outcome: Progressing   Problem: Nutrition: Goal: Adequate nutrition will be maintained Outcome: Progressing   Problem: Coping: Goal: Level of anxiety will decrease Outcome: Progressing   Problem: Elimination: Goal: Will not experience complications related to bowel motility Outcome: Progressing Goal: Will not experience complications related to urinary retention Outcome: Progressing

## 2020-10-26 NOTE — Plan of Care (Signed)

## 2020-10-26 NOTE — Progress Notes (Signed)
[place]       [employer] [address]             [phone]      7 Days Post-Op Procedure(s) (LRB): XI ROBOTIC ASSISTED THORASCOPY- RIGHT UPPER LOBECTOMY (Right) INTERCOSTAL NERVE BLOCK (Right) NODE DISSECTION (Right) Subjective:  feels same, not SOB, comfortable  Objective: Vital signs in last 24 hours: Temp:  [97.8 F (36.6 C)-98.7 F (37.1 C)] 98 F (36.7 C) (02/10 0342) Pulse Rate:  [60-72] 60 (02/10 0342) Cardiac Rhythm: Normal sinus rhythm (02/10 0700) Resp:  [13-20] 20 (02/10 0342) BP: (91-116)/(60-76) 116/66 (02/10 0342) SpO2:  [91 %-100 %] 100 % (02/10 0342)  Hemodynamic parameters for last 24 hours:    Intake/Output from previous day: 02/09 0701 - 02/10 0700 In: 45 [I.V.:45] Out: 1870 [Urine:1650; Chest Tube:220] Intake/Output this shift: No intake/output data recorded.  General appearance: alert, cooperative and no distress Heart: regular rate and rhythm Lungs: coarse and diminished but somewhat harder to hear d/t sub q air Abdomen: benign Extremities: no edema or calf tenderness Wound: incis healing well  Lab Results: Recent Labs    10/26/20 0033  WBC 8.8  HGB 10.9*  HCT 34.8*  PLT 275   BMET:  Recent Labs    10/26/20 0033  NA 136  K 3.9  CL 100  CO2 26  GLUCOSE 94  BUN 8  CREATININE 0.59  CALCIUM 8.2*    PT/INR: No results for input(s): LABPROT, INR in the last 72 hours. ABG    Component Value Date/Time   PHART 7.391 10/19/2020 1008   HCO3 26.6 10/19/2020 1008   TCO2 28 10/19/2020 1008   O2SAT 98.0 10/19/2020 1008   CBG (last 3)  No results for input(s): GLUCAP in the last 72 hours.  Meds Scheduled Meds: . bisacodyl  10 mg Oral Daily  . enoxaparin (LOVENOX) injection  40 mg Subcutaneous Q24H  . fluticasone  2 spray Each Nare Daily  . guaiFENesin  600 mg Oral BID  . mouth rinse  15 mL Mouth Rinse BID  . mometasone-formoterol  2 puff Inhalation BID  . multivitamin with minerals  1 tablet Oral  Daily  . senna-docusate  1 tablet Oral QHS   Continuous Infusions: . 0.9 % NaCl with KCl 20 mEq / L 10 mL/hr at 10/25/20 1930   PRN Meds:.levalbuterol, ondansetron (ZOFRAN) IV, oxyCODONE, traMADol  Xrays DG Chest Port 1 View  Result Date: 10/25/2020 CLINICAL DATA:  Status post right thoracotomy with chest tube in place EXAM: PORTABLE CHEST 1 VIEW COMPARISON:  October 24, 2020 FINDINGS: Postoperative change on the right noted with chest tube unchanged in position. Right apical pneumothorax is again noted and may be slightly larger compared to 1 day prior. No tension component. There is extensive subcutaneous air on the right. There is pneumomediastinum present. There is atelectatic change in the left base. Volume loss right upper lung region is stable. No new opacity. Heart size and pulmonary vascularity appear stable. No adenopathy. No bone lesions IMPRESSION: Postoperative change on the right with chest tube position unchanged. Pneumothorax on the right slightly larger without tension component. Extensive subcutaneous air again noted. Grossly stable pneumomediastinum. Stable left base atelectasis. No new opacity. Stable cardiac silhouette. Electronically Signed   By: Lowella Grip III M.D.   On: 10/25/2020 08:06    Assessment/Plan: S/P Procedure(s) (LRB): XI ROBOTIC ASSISTED THORASCOPY- RIGHT UPPER LOBECTOMY (Right) INTERCOSTAL NERVE BLOCK (Right) NODE DISSECTION (Right)  1 afeb, VSS  2 sats ok on 1.5 liters Raymond 3 stable renal fxn 4 mild ABLA anemia is stable- monitor clinically 5 CXR shows stable PNTX 6 CT - 220 cc drainage, air leak is same 7 cont same , may need to consider other procedures for air leak    LOS: 7 days    John Giovanni PA-C Pager [phone] 10/26/2020

## 2020-10-27 ENCOUNTER — Inpatient Hospital Stay (HOSPITAL_COMMUNITY): Payer: Medicare Other

## 2020-10-27 IMAGING — DX DG CHEST 1V PORT
1 series · 1 of 1 positions shown · non-contrast
Comparison: [DATE].

CLINICAL DATA: Pneumothorax.

EXAM:
PORTABLE CHEST 1 VIEW

[chest]
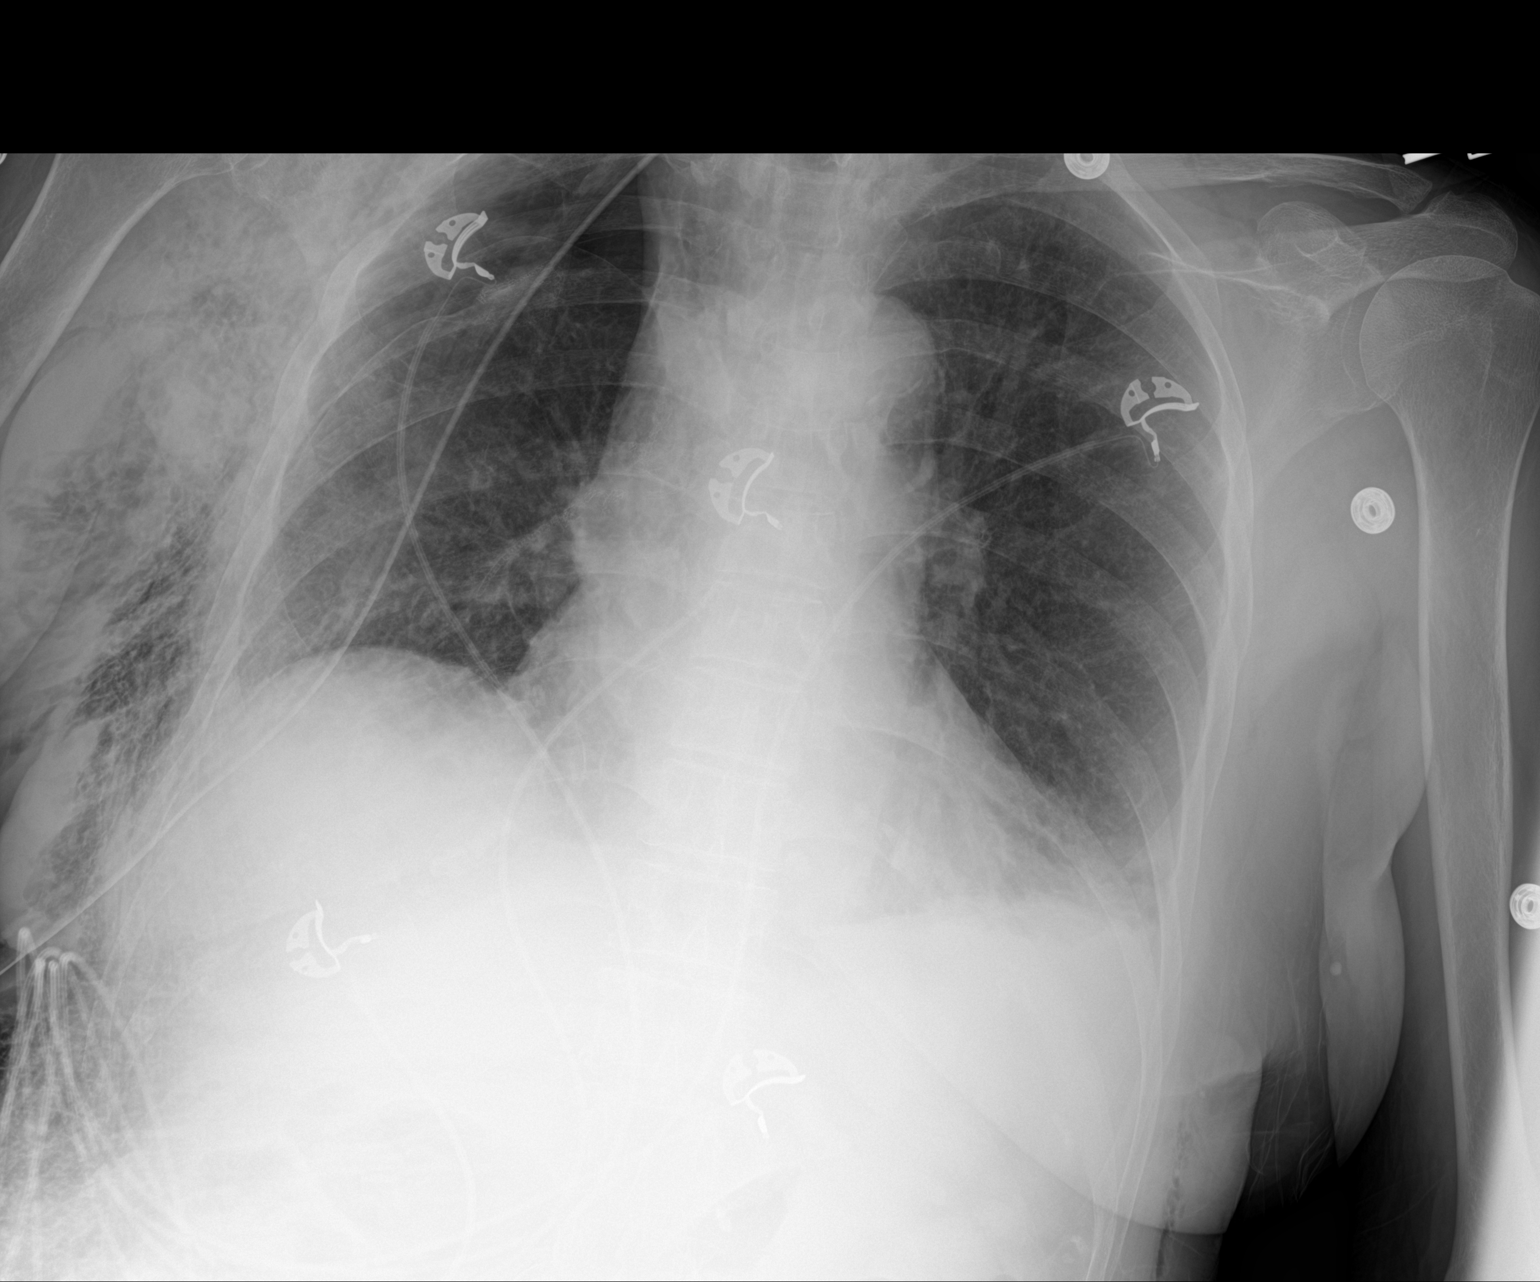

[1 of 1 positions shown; findings below may reference images not displayed]

FINDINGS: Interval slight increase in a now moderate right pneumothorax.
Similar position of a right chest tube with tip projecting at the
right medial upper chest. No definite mediastinal shift. No left
pneumothorax or sizable pleural effusion. Mild left basilar linear
opacities, favor atelectasis. Similar subcutaneous emphysema
involving the right greater than left neck and chest wall. Similar
cardiomediastinal contour with right paramediastinal prominence
likely representing the adenopathy seen on prior CT chest. Calcific
atherosclerosis of the aorta.
IMPRESSION: 1. Interval slight increase in a now moderate right pneumothorax.
Similar position of a right chest tube.
2. Similar subcutaneous emphysema and pneumomediastinum.
3. Similar mild left basilar linear opacities, favor atelectasis.

These results will be called to the ordering clinician or
representative by the Radiologist Assistant, and communication
documented in the PACS or [REDACTED].

## 2020-10-27 NOTE — Plan of Care (Signed)
  Problem: Education: Goal: Knowledge of General Education information will improve Description: Including pain rating scale, medication(s)/side effects and non-pharmacologic comfort measures Outcome: Progressing   Problem: Clinical Measurements: Goal: Ability to maintain clinical measurements within normal limits will improve Outcome: Progressing Goal: Will remain free from infection Outcome: Progressing Goal: Diagnostic test results will improve Outcome: Progressing Goal: Respiratory complications will improve Outcome: Progressing Goal: Cardiovascular complication will be avoided Outcome: Progressing   Problem: Activity: Goal: Risk for activity intolerance will decrease Outcome: Progressing   Problem: Nutrition: Goal: Adequate nutrition will be maintained Outcome: Progressing   Problem: Coping: Goal: Level of anxiety will decrease Outcome: Progressing   Problem: Elimination: Goal: Will not experience complications related to bowel motility Outcome: Progressing Goal: Will not experience complications related to urinary retention Outcome: Progressing   Problem: Pain Managment: Goal: General experience of comfort will improve Outcome: Progressing   Problem: Safety: Goal: Ability to remain free from injury will improve Outcome: Progressing   Problem: Skin Integrity: Goal: Risk for impaired skin integrity will decrease Outcome: Progressing   Problem: Education: Goal: Knowledge of disease or condition will improve Outcome: Progressing Goal: Knowledge of the prescribed therapeutic regimen will improve Outcome: Progressing   Problem: Activity: Goal: Risk for activity intolerance will decrease Outcome: Progressing   Problem: Cardiac: Goal: Will achieve and/or maintain hemodynamic stability Outcome: Progressing   Problem: Clinical Measurements: Goal: Postoperative complications will be avoided or minimized Outcome: Progressing   Problem: Respiratory: Goal:  Respiratory status will improve Outcome: Progressing   Problem: Pain Management: Goal: Pain level will decrease Outcome: Progressing   Problem: Skin Integrity: Goal: Wound healing without signs and symptoms infection will improve Outcome: Progressing

## 2020-10-27 NOTE — Plan of Care (Signed)

## 2020-10-27 NOTE — Progress Notes (Addendum)
[address]       [place] [address]             [phone]      8 Days Post-Op Procedure(s) (LRB): XI ROBOTIC ASSISTED THORASCOPY- RIGHT UPPER LOBECTOMY (Right) INTERCOSTAL NERVE BLOCK (Right) NODE DISSECTION (Right) Subjective: Feels ok  Objective: Vital signs in last 24 hours: Temp:  [97.4 F (36.3 C)-98.5 F (36.9 C)] 97.6 F (36.4 C) (02/11 0310) Pulse Rate:  [60-68] 60 (02/11 0310) Cardiac Rhythm: Normal sinus rhythm (02/11 0310) Resp:  [14-22] 14 (02/11 0310) BP: (101-119)/(56-66) 119/63 (02/11 0310) SpO2:  [90 %-98 %] 98 % (02/11 0310)  Hemodynamic parameters for last 24 hours:    Intake/Output from previous day: 02/10 0701 - 02/11 0700 In: -  Out: 320 [Urine:170; Chest Tube:150] Intake/Output this shift: No intake/output data recorded.  General appearance: alert, cooperative and no distress Heart: regular rate and rhythm Lungs: fair air movement on right Abdomen: benign Extremities: no edema or calf tenderness Wound: incis healing well  Lab Results: Recent Labs    10/26/20 0033  WBC 8.8  HGB 10.9*  HCT 34.8*  PLT 275   BMET:  Recent Labs    10/26/20 0033  NA 136  K 3.9  CL 100  CO2 26  GLUCOSE 94  BUN 8  CREATININE 0.59  CALCIUM 8.2*    PT/INR: No results for input(s): LABPROT, INR in the last 72 hours. ABG    Component Value Date/Time   PHART 7.391 10/19/2020 1008   HCO3 26.6 10/19/2020 1008   TCO2 28 10/19/2020 1008   O2SAT 98.0 10/19/2020 1008   CBG (last 3)  No results for input(s): GLUCAP in the last 72 hours.  Meds Scheduled Meds: . bisacodyl  10 mg Oral Daily  . enoxaparin (LOVENOX) injection  40 mg Subcutaneous Q24H  . fluticasone  2 spray Each Nare Daily  . guaiFENesin  600 mg Oral BID  . mouth rinse  15 mL Mouth Rinse BID  . mometasone-formoterol  2 puff Inhalation BID  . multivitamin with minerals  1 tablet Oral Daily  . senna-docusate  1 tablet Oral QHS   Continuous Infusions: . 0.9  % NaCl with KCl 20 mEq / L 10 mL/hr at 10/25/20 1930   PRN Meds:.levalbuterol, ondansetron (ZOFRAN) IV, oxyCODONE, traMADol  Xrays DG Chest 1 View  Result Date: 10/26/2020 CLINICAL DATA:  Status post RIGHT upper lobectomy EXAM: CHEST  1 VIEW COMPARISON:  October 25, 2020 FINDINGS: The cardiomediastinal silhouette is unchanged in contour.Atherosclerotic calcifications of the aorta. RIGHT-sided chest tube. Similar appearance of a small RIGHT apical pneumothorax. Extensive subcutaneous air. No pleural effusion. No LEFT pneumothorax. LEFT basilar heterogeneous opacities, unchanged and likely atelectasis. RIGHT upper lobe paramediastinal opacity, unchanged. Visualized abdomen is unremarkable. No acute osseous abnormality. IMPRESSION: 1. Stable small RIGHT apical pneumothorax with RIGHT-sided chest tube in place. Electronically Signed   By: Valentino Saxon MD   On: 10/26/2020 07:47    Assessment/Plan: S/P Procedure(s) (LRB): XI ROBOTIC ASSISTED THORASCOPY- RIGHT UPPER LOBECTOMY (Right) INTERCOSTAL NERVE BLOCK (Right) NODE DISSECTION (Right)  1 afeb, VSS 2 sats ok on 1.5 liters, cont to wean off 3 CXR - pntx is a bit larger 4 no new labs 5 CT- 150 cc , + air leak all chambers with cough, currently on H2O seal, may need suction replaced, MD to decide 6 no new labs       LOS: 8 days    Senath E  Jed Limerick Pager [phone] 10/27/2020 Patient seen and examined, agree with above Space/ pneumothorax is a little larger but she is tolerating well with no increase in SQ emphysema or respiratory issues Plan will be to place to mini-express. If CXR stable or improved tomorrow- dc with tube with short term interval follow up If CXR worsens, short of breath or increased SQ air will place back to suction and consider IBV placement  Abu Heavin C. Roxan Hockey, MD [facility] [phone]

## 2020-10-27 NOTE — TOC Transition Note (Addendum)
Transition of Care [facility]) - CM/SW Discharge Note   Patient Details  Name: [name] MRN: [id number] Date of Birth: [birth year]  Transition of Care [facility]) CM/SW Contact:  Zenon Mayo, RN Phone Number: 10/27/2020, 4:02 PM   Clinical Narrative:    NCM spoke with patient at bedside, she lives with her spouse.  NCM offered choice, she chose [name], NCM made referral to [name] with [name], he is able to take referral for [name] for mini chest tube express.  Soc will begin 24 to 48  Hrs post dc.  NCM notified TCTS team to put in [facility] order.   Final next level of care: [name] Barriers to Discharge: Continued Medical Work up   Patient Goals and CMS Choice Patient states their goals for this hospitalization and ongoing recovery are:: get better CMS Medicare.gov Compare Post Acute Care list provided to:: Patient Choice offered to / list presented to : Patient  Discharge Placement                       Discharge Plan and Services                  DME Agency: NA       HH Arranged: RN [name] Agency: [name] Date [facility] Agency Contacted: 10/27/20 Time [name]: 1602 Representative spoke with at [place]: [name] ([place]) Interventions     Readmission Risk Interventions No flowsheet data found.

## 2020-10-27 NOTE — Progress Notes (Signed)
CRITICAL VALUE ALERT  Critical Value:  CXR  Date & Time Notied:  10/27/2020 at [place]  Provider Notified: Dr. Roxan Hockey already aware  Orders Received/Actions taken: continue to monitor Pt

## 2020-10-28 ENCOUNTER — Inpatient Hospital Stay (HOSPITAL_COMMUNITY): Payer: Medicare Other

## 2020-10-28 IMAGING — DX DG CHEST 1V PORT
1 series · 1 of 1 positions shown · non-contrast
Comparison: [DATE] chest radiograph; chest CT [DATE]

CLINICAL DATA: Pneumothorax with chest tube in place

EXAM:
PORTABLE CHEST 1 VIEW

[chest ap]
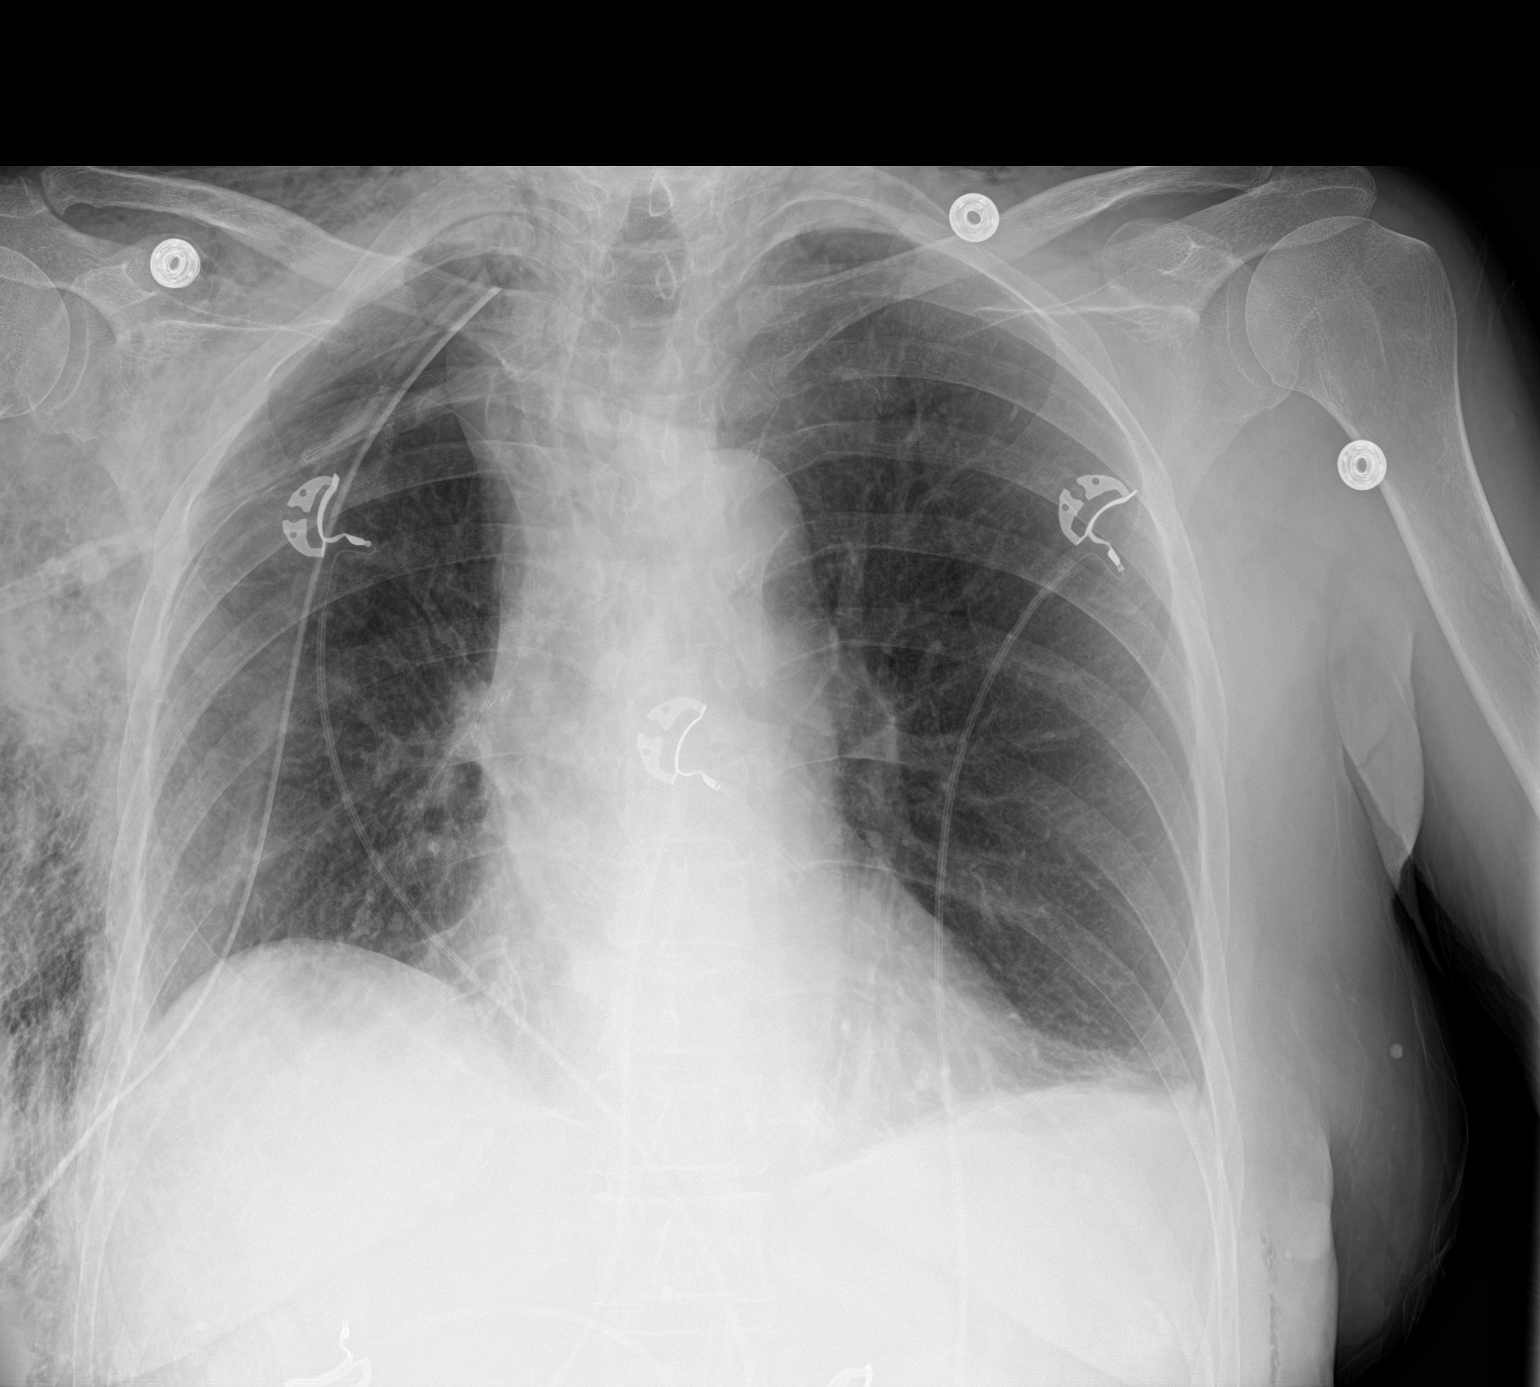

[1 of 1 positions shown; findings below may reference images not displayed]

FINDINGS: Chest tube position on the right is unchanged. Right sided
pneumothorax is essentially stable without tension component. There
is extensive subcutaneous air, primarily on the right but also in
the left supraclavicular region. There is atelectatic change in the
left base. No consolidation. Postoperative change on the right is
noted.

Heart size is within normal limits. Pulmonary vascularity is mildly
distorted on the right and normal on the left. Questionable
adenopathy right paratracheal region, better delineated on prior CT.
No bone lesions. There is aortic atherosclerosis.
IMPRESSION: Chest tube position on the right unchanged with stable pneumothorax
on the right. No tension component. Extensive subcutaneous emphysema
noted.

Left base atelectasis. No edema or airspace opacity. Postoperative
change noted on the right with scarring in the right perihilar
region. Stable right paratracheal prominence which may represent
lymph node enlargement based on prior CT. Stable cardiac silhouette
overall. Aortic Atherosclerosis ([U4]-[U4]).

## 2020-10-28 MED ORDER — OXYCODONE HCL 5 MG PO TABS: 5.0000 mg | ORAL_TABLET | ORAL | 0 refills | Status: DC | PRN

## 2020-10-28 MED ORDER — GUAIFENESIN ER 600 MG PO TB12: 600.0000 mg | ORAL_TABLET | Freq: Two times a day (BID) | ORAL | Status: DC | PRN

## 2020-10-28 NOTE — Discharge Instructions (Signed)
1. General Patient Instructions:  If the tube becomes disconnected, reconnect it immediately and tape it securely. To reconnect the tube: Pinch the chest tube with your fingers to close the chest tube until you can re attach it. If you are able, clean the ends of the chest tube and drain with an alcohol swab before reattaching.   If the tube becomes disconnected AND you cannot put it back together, go to closest Emergency Department  Please do NOT allow the mini express chamber to become full. To empty the fluid from the chamber: First, wash your hands with soap and water. Use a Luer lock syringe (provided by hospital or home health, if arranged) to attach to the front port (twist Luer lock syringe clock wise to to tighten) at the bottom of the mini express. Pull the syringe plunger back, unscrew the syringe and put fluid into the toilet. You may repeat as necessary. If it becomes difficult to empty the fluid in the mini express, squirt water through the port (where syringe attaches to) to flush out the blockage. If this does not work, call the office .  Try to keep mini express upright and below the level of your heart. Also, check periodically that there are no kinks in the tubing.   Changing the chest tube dressing: Please change the dressing at least every other day or if it becomes wet. You will be taught how to change the dressing before you are discharged from the hospital or home health will be arranged to do it for you.   If the chest falls out or gets pulled out: Place a piece of gauze over the site and cover the gauze with tape. Contact our office ASAP [phone]) If you have chest pain or sudden onset of shortness of breath, go to the nearest emergency room   Robot-Assisted Thoracic Surgery, Care After The following information offers guidance on how to care for yourself after your procedure. Your health care provider may also give you more specific instructions. If you have  problems or questions, contact your health care provider. What can I expect after the procedure? After the procedure, it is common to have:  Some pain and aches in the area of your surgical incisions.  Pain when breathing in (inhaling) and coughing.  Tiredness (fatigue).  Trouble sleeping.  Constipation. Follow these instructions at home: Medicines  Take over-the-counter and prescription medicines only as told by your health care provider.  If you were prescribed an antibiotic medicine, take it as told by your health care provider. Do not stop taking the antibiotic even if you start to feel better.  Talk with your health care provider about safe and effective ways to manage pain after your procedure. Pain management should fit your specific health needs.  Take pain medicine before pain becomes severe. Relieving and controlling your pain will make breathing easier for you.  Ask your health care provider if the medicine prescribed to you requires you to avoid driving or using machinery. Eating and drinking Follow instructions from your health care provider about eating or drinking restrictions. These will vary depending on what procedure you had. Your health care provider may recommend:  A liquid diet or soft diet for the first few days.  Meals that are smaller and more frequent.  A diet of fruits, vegetables, whole grains, and low-fat proteins.  Limiting foods that are high in fat and processed sugar, including fried or sweet foods. Incision care  Follow instructions from  your health care provider about how to take care of your incisions. Make sure you: ? Wash your hands with soap and water for at least 20 seconds before and after you change your bandage (dressing). If soap and water are not available, use hand sanitizer. ? Change your dressing as told by your health care provider. ? Leave stitches (sutures), skin glue, or adhesive strips in place. These skin closures may need  to stay in place for 2 weeks or longer. If adhesive strip edges start to loosen and curl up, you may trim the loose edges. Do not remove adhesive strips completely unless your health care provider tells you to do that.  Check your incision area every day for signs of infection. Check for: ? Redness, swelling, or more pain. ? Fluid or blood. ? Warmth. ? Pus or a bad smell. Activity  Return to your normal activities as told by your health care provider. Ask your health care provider what activities are safe for you.  Ask your health care provider when it is safe for you to drive.  Do not lift anything that is heavier than 10 lb (4.5 kg), or the limit that you are told, until your health care provider says that it is safe.  Rest as told by your health care provider.  Avoid sitting for a long time without moving. Get up to take short walks every 1-2 hours. This is important to improve blood flow and breathing. Ask for help if you feel weak or unsteady.  Do exercises as told by your health care provider. Pneumonia prevention  Do deep breathing exercises and cough regularly as directed. This helps clear mucus and opens your lungs. Doing this helps prevent lung infection (pneumonia).  If you were given an incentive spirometer, use it as told. An incentive spirometer is a tool that measures how well you are filling your lungs with each breath.  Coughing may hurt less if you try to support your chest. This is called splinting. Try one of these when you cough: ? Hold a pillow against your chest. ? Place the palms of both hands on top of your incision area.  Do not use any products that contain nicotine or tobacco. These products include cigarettes, chewing tobacco, and vaping devices, such as e-cigarettes. If you need help quitting, ask your health care provider.  Avoid secondhand smoke.   General instructions  If you have a drainage tube: ? Follow instructions from your health care  provider about how to take care of it. ? Do not travel by airplane after your tube is removed until your health care provider tells you it is safe.  You may need to take these actions to prevent or treat constipation: ? Drink enough fluid to keep your urine pale yellow. ? Take over-the-counter or prescription medicines. ? Eat foods that are high in fiber, such as beans, whole grains, and fresh fruits and vegetables. ? Limit foods that are high in fat and processed sugars, such as fried or sweet foods.  Keep all follow-up visits. This is important. Contact a health care provider if:  You have redness, swelling, or more pain around an incision.  You have fluid or blood coming from an incision.  An incision feels warm to the touch.  You have pus or a bad smell coming from an incision.  You have a fever.  You cannot eat or drink without vomiting.  Your pain medicine is not controlling your pain. Get help right away  if:  You have chest pain.  Your heart is beating quickly.  You have trouble breathing.  You have trouble speaking.  You are confused.  You feel weak or dizzy, or you faint. These symptoms may represent a serious problem that is an emergency. Do not wait to see if the symptoms will go away. Get medical help right away. Call your local emergency services (911 in the [place] Do not drive yourself to the hospital. Summary  Talk with your health care provider about safe and effective ways to manage pain after your procedure. Pain management should fit your specific health needs.  Return to your normal activities as told by your health care provider. Ask your health care provider what activities are safe for you.  Do deep breathing exercises and cough regularly as directed. This helps to clear mucus and prevent pneumonia. If it hurts to cough, ease pain by holding a pillow against your chest or by placing the palms of both hands over your incisions. This information is  not intended to replace advice given to you by your health care provider. Make sure you discuss any questions you have with your health care provider. Document Revised: 05/26/2020 Document Reviewed: 05/26/2020 Elsevier Patient Education  2021 [employer].

## 2020-10-28 NOTE — Progress Notes (Signed)
Notified [name] with [name] that pt is going home today.

## 2020-10-28 NOTE — Progress Notes (Addendum)
      [address]       [place] [address]             [phone]       9 Days Post-Op Procedure(s) (LRB): XI ROBOTIC ASSISTED THORASCOPY- RIGHT UPPER LOBECTOMY (Right) INTERCOSTAL NERVE BLOCK (Right) NODE DISSECTION (Right)  Subjective: Patient without specific complaint this am. She hopes to go home.  Objective: Vital signs in last 24 hours: Temp:  [97.9 F (36.6 C)-98.3 F (36.8 C)] 98 F (36.7 C) (02/12 0836) Pulse Rate:  [60-75] 75 (02/12 0836) Cardiac Rhythm: Normal sinus rhythm (02/12 0259) Resp:  [13-20] 17 (02/12 0836) BP: (92-122)/(55-72) 92/55 (02/12 0836) SpO2:  [90 %-98 %] 94 % (02/12 0836)      Intake/Output from previous day: 02/11 0701 - 02/12 0700 In: -  Out: 250 [Urine:200; Chest Tube:50]   Physical Exam:  Cardiovascular: RRR Pulmonary: Clear to auscultation on the left and coarse on the right with chest tube in place. Subcutaneous emphysema on the right.  Wounds: Clean and dry.  No erythema or signs of infection. Chest Tube: to mini express, air leak  Lab Results: NLZ:JQBHAL Labs    10/26/20 0033  WBC 8.8  HGB 10.9*  HCT 34.8*  PLT 275   BMET:  Recent Labs    10/26/20 0033  NA 136  K 3.9  CL 100  CO2 26  GLUCOSE 94  BUN 8  CREATININE 0.59  CALCIUM 8.2*    PT/INR: No results for input(s): LABPROT, INR in the last 72 hours. ABG:  INR: Will add last result for INR, ABG once components are confirmed Will add last 4 CBG results once components are confirmed  Assessment/Plan:  1. CV - SR 2.  Pulmonary - On 2 liters of oxygen via [place]. Try to wean. Chest tube with 50 cc last 12 hours. Chest tube is to mini express and there is an air leak. CXR appears stable(right pneumothorax). Encourage incentive spirometer. 3. Will discuss disposition with Dr. Leeroy Cha M ZimmermanPA-C 10/28/2020,10:06 AM [phone]   I have seen and examined the patient and agree with the assessment and plan as outlined.  D/C  home w/ mini express.  Rexene Alberts, MD 10/28/2020 12:02 PM

## 2020-10-29 LAB — TYPE AND SCREEN
ABO/RH(D): A POS
Antibody Screen: NEGATIVE
Unit division: 0
Unit division: 0

## 2020-10-29 LAB — BPAM RBC
Blood Product Expiration Date: 202203012359
Blood Product Expiration Date: 202203012359
ISSUE DATE / TIME: 202202030855
ISSUE DATE / TIME: 202202030855
Unit Type and Rh: 6200
Unit Type and Rh: 6200

## 2020-10-31 ENCOUNTER — Other Ambulatory Visit: Payer: Self-pay | Admitting: Thoracic Surgery (Cardiothoracic Vascular Surgery)

## 2020-10-31 DIAGNOSIS — R911 Solitary pulmonary nodule: Secondary | ICD-10-CM

## 2020-10-31 DIAGNOSIS — J95811 Postprocedural pneumothorax: Secondary | ICD-10-CM | POA: Diagnosis not present

## 2020-10-31 DIAGNOSIS — C3411 Malignant neoplasm of upper lobe, right bronchus or lung: Secondary | ICD-10-CM | POA: Diagnosis not present

## 2020-11-01 ENCOUNTER — Ambulatory Visit (INDEPENDENT_AMBULATORY_CARE_PROVIDER_SITE_OTHER): Payer: Self-pay | Admitting: Thoracic Surgery (Cardiothoracic Vascular Surgery)

## 2020-11-01 ENCOUNTER — Other Ambulatory Visit: Payer: Self-pay | Admitting: *Deleted

## 2020-11-01 ENCOUNTER — Encounter: Payer: Self-pay | Admitting: Thoracic Surgery (Cardiothoracic Vascular Surgery)

## 2020-11-01 ENCOUNTER — Ambulatory Visit
Admission: RE | Admit: 2020-11-01 | Discharge: 2020-11-01 | Disposition: A | Discharge status: Home / Self Care | Payer: Medicare Other | Source: Ambulatory Visit | Attending: Thoracic Surgery (Cardiothoracic Vascular Surgery) | Admitting: Thoracic Surgery (Cardiothoracic Vascular Surgery)

## 2020-11-01 ENCOUNTER — Other Ambulatory Visit: Payer: Self-pay

## 2020-11-01 VITALS — BP 118/75 | HR 61 | Resp 20 | Ht 63.0 in | Wt 133.0 lb

## 2020-11-01 DIAGNOSIS — Z4682 Encounter for fitting and adjustment of non-vascular catheter: Secondary | ICD-10-CM

## 2020-11-01 DIAGNOSIS — Z902 Acquired absence of lung [part of]: Secondary | ICD-10-CM

## 2020-11-01 DIAGNOSIS — R911 Solitary pulmonary nodule: Secondary | ICD-10-CM

## 2020-11-01 DIAGNOSIS — Z9889 Other specified postprocedural states: Secondary | ICD-10-CM

## 2020-11-01 IMAGING — DX DG CHEST 2V
2 series · 2 of 2 positions shown · non-contrast
Comparison: [DATE]

CLINICAL DATA: Lung nodule

EXAM:
CHEST - 2 VIEW

[dg chest 2 view (1 of 2)]
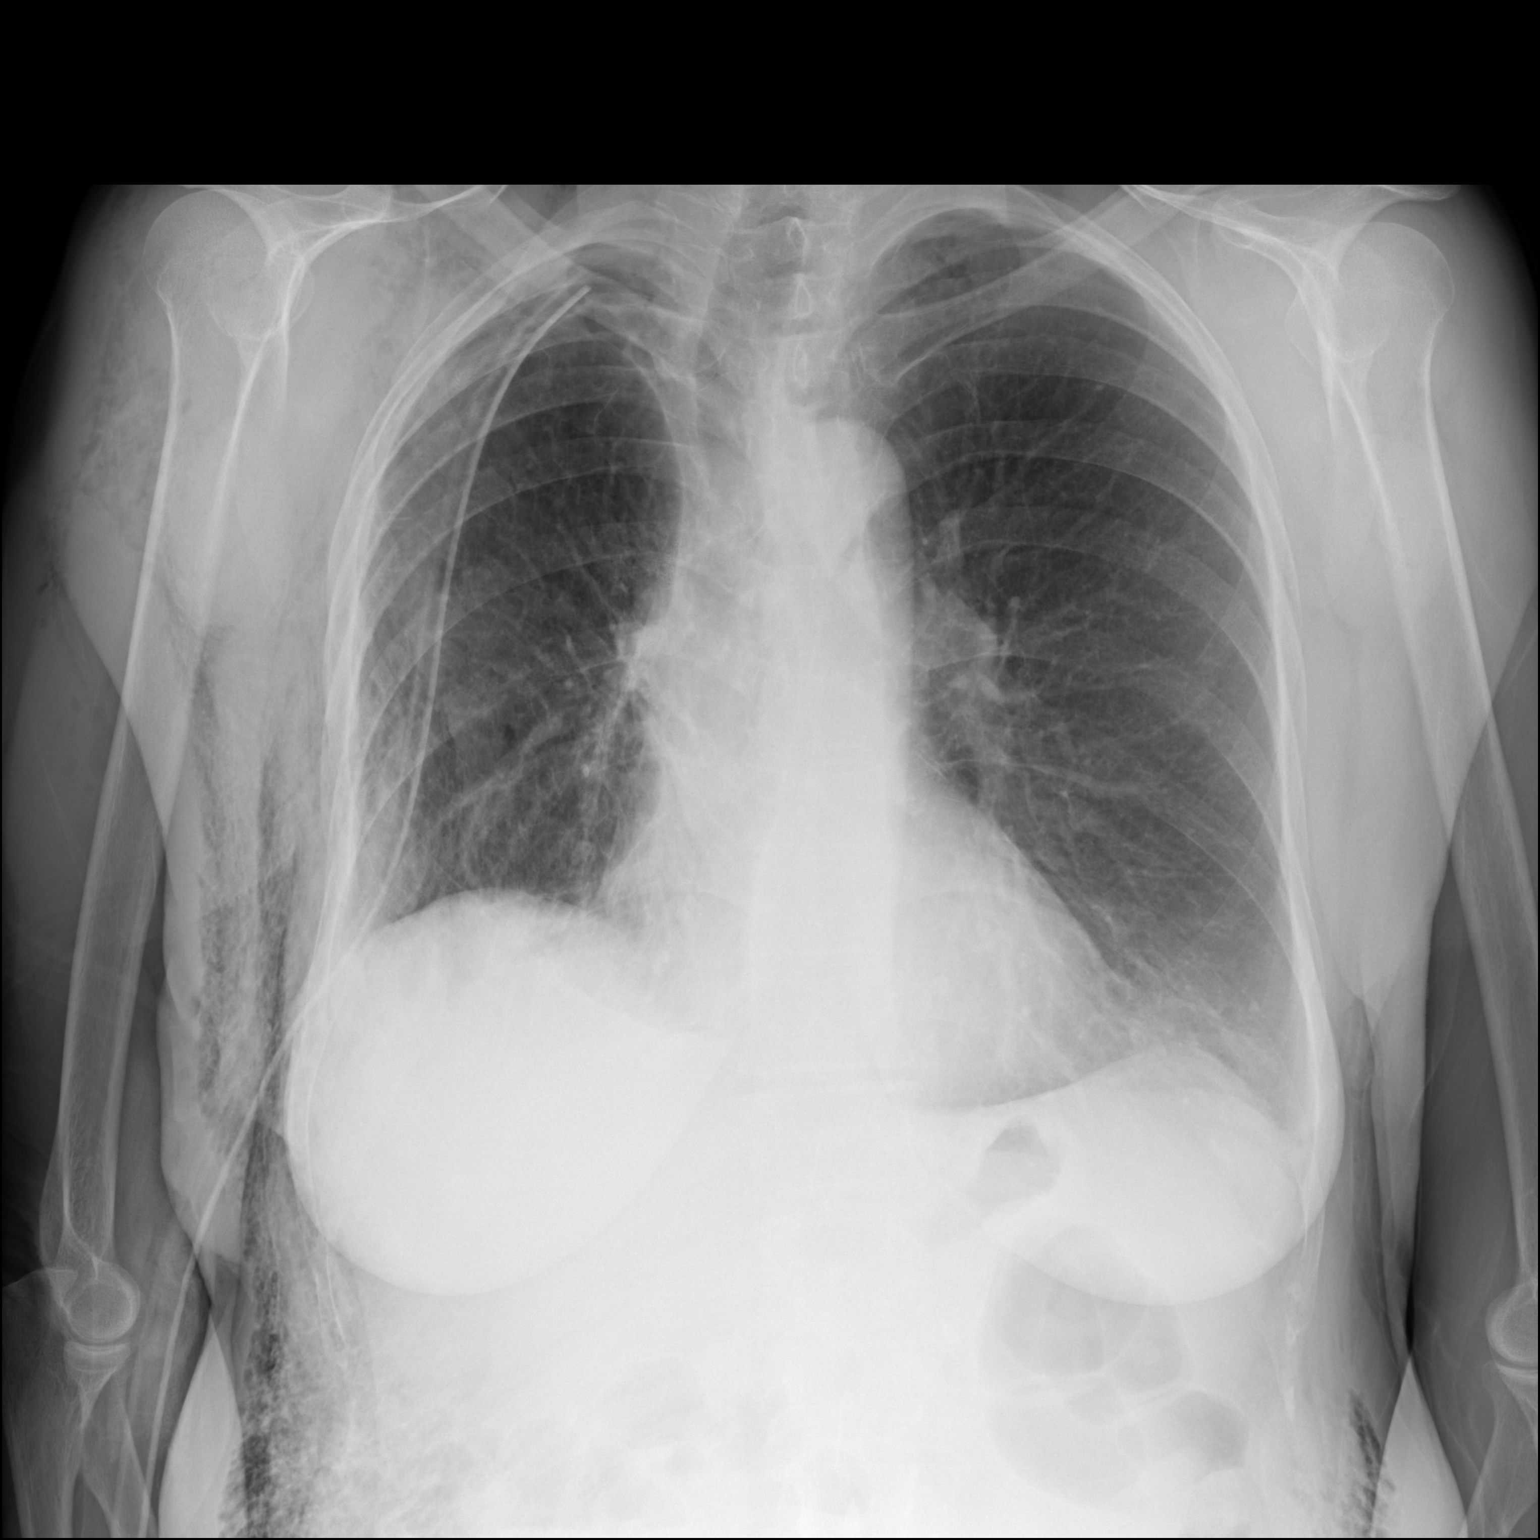

[dg chest 2 view (2 of 2)]
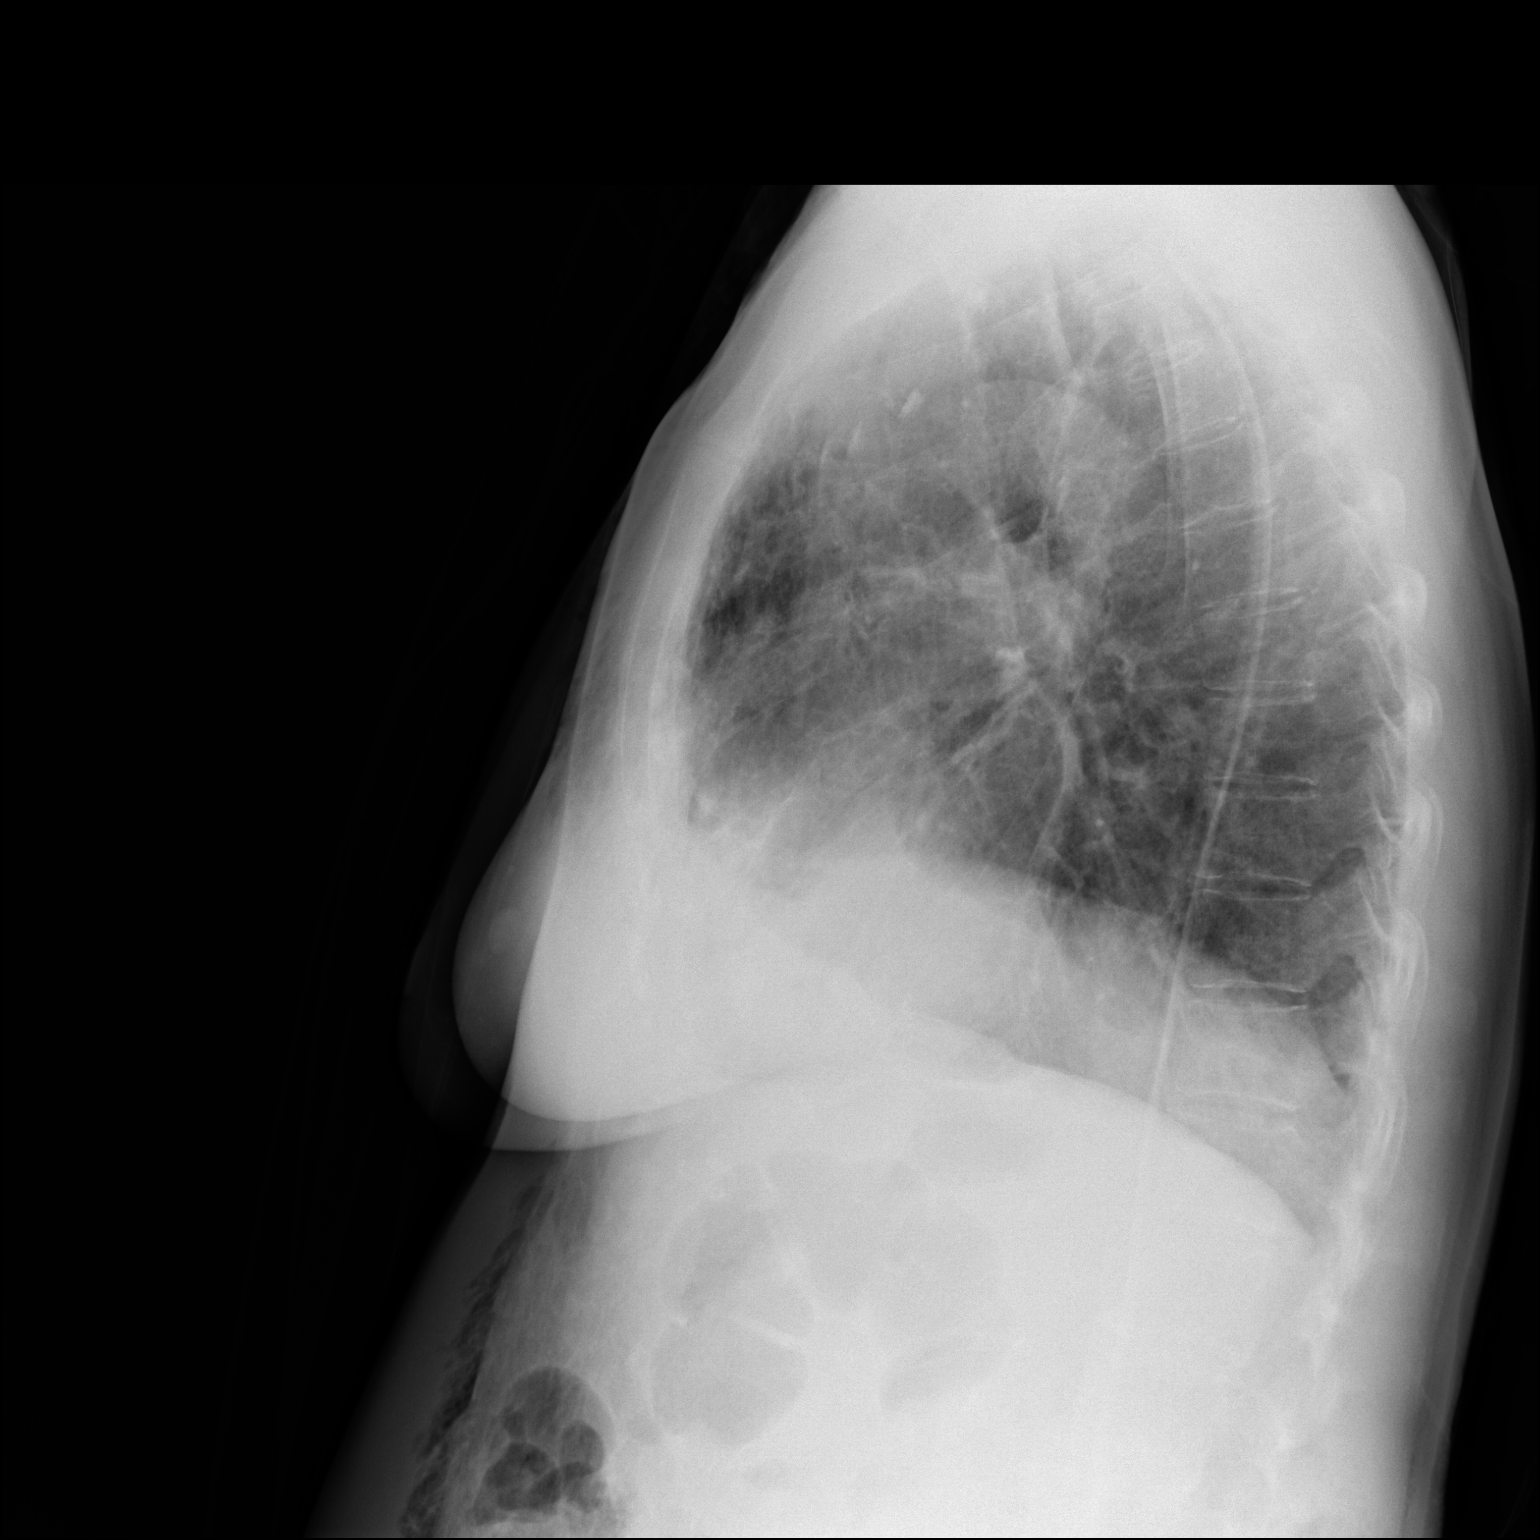

[2 of 2 positions shown; findings below may reference images not displayed]

FINDINGS: Right apical chest tube remains present. Decreased small right
pneumothorax. Volume loss in the right chest. No new consolidation.
Decreased atelectasis at the left lung base. No pleural effusion.
Decreased chest wall emphysema.
IMPRESSION: Decreased small right pneumothorax with chest tube remaining
present. Decreased chest wall emphysema. Decreased left basilar
atelectasis.

## 2020-11-01 IMAGING — DX DG CHEST 2V
2 series · 2 of 2 positions shown · non-contrast
Comparison: Earlier same day

CLINICAL DATA: Post chest tube removal

EXAM:
CHEST - 2 VIEW

[dg chest 2 view (1 of 2)]
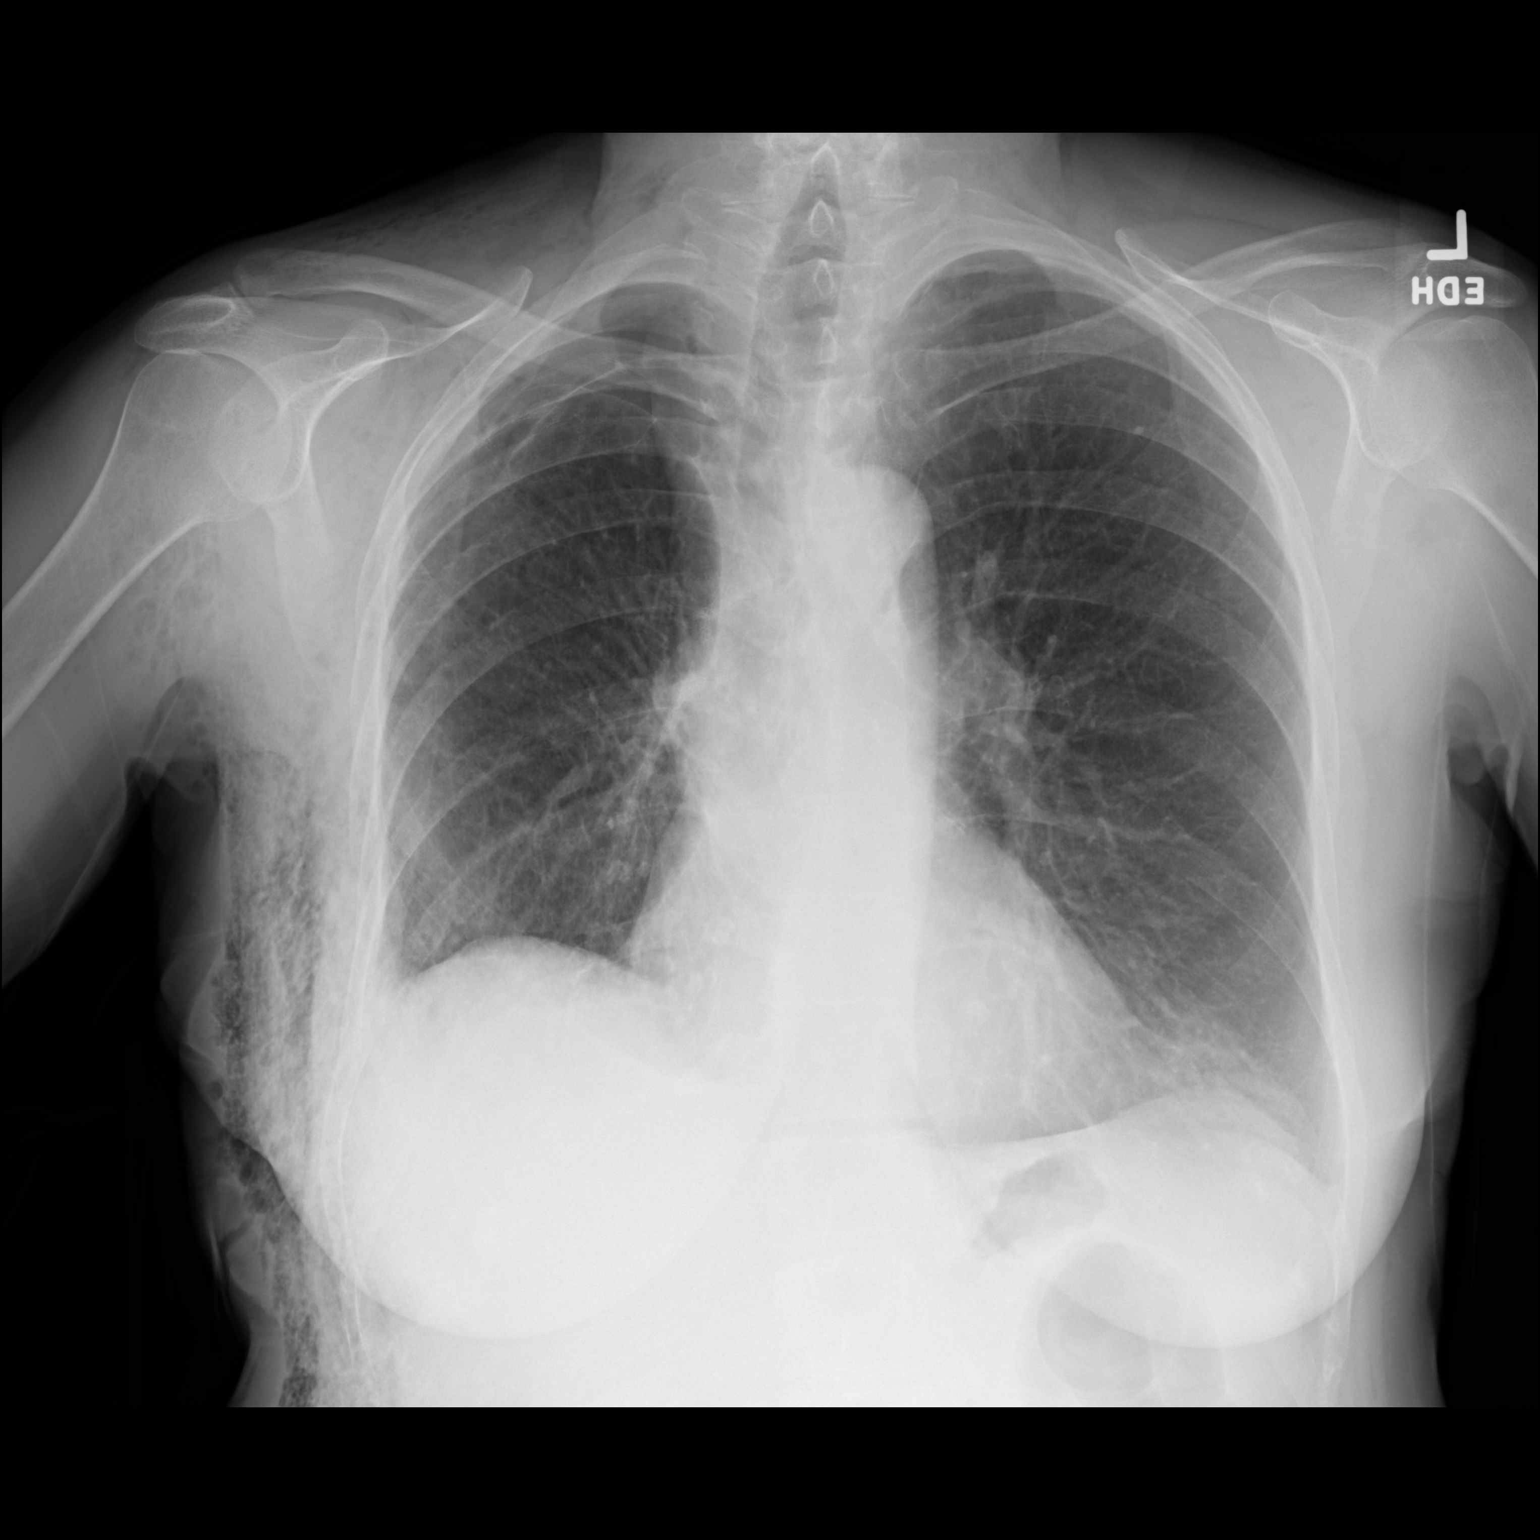

[dg chest 2 view (2 of 2)]
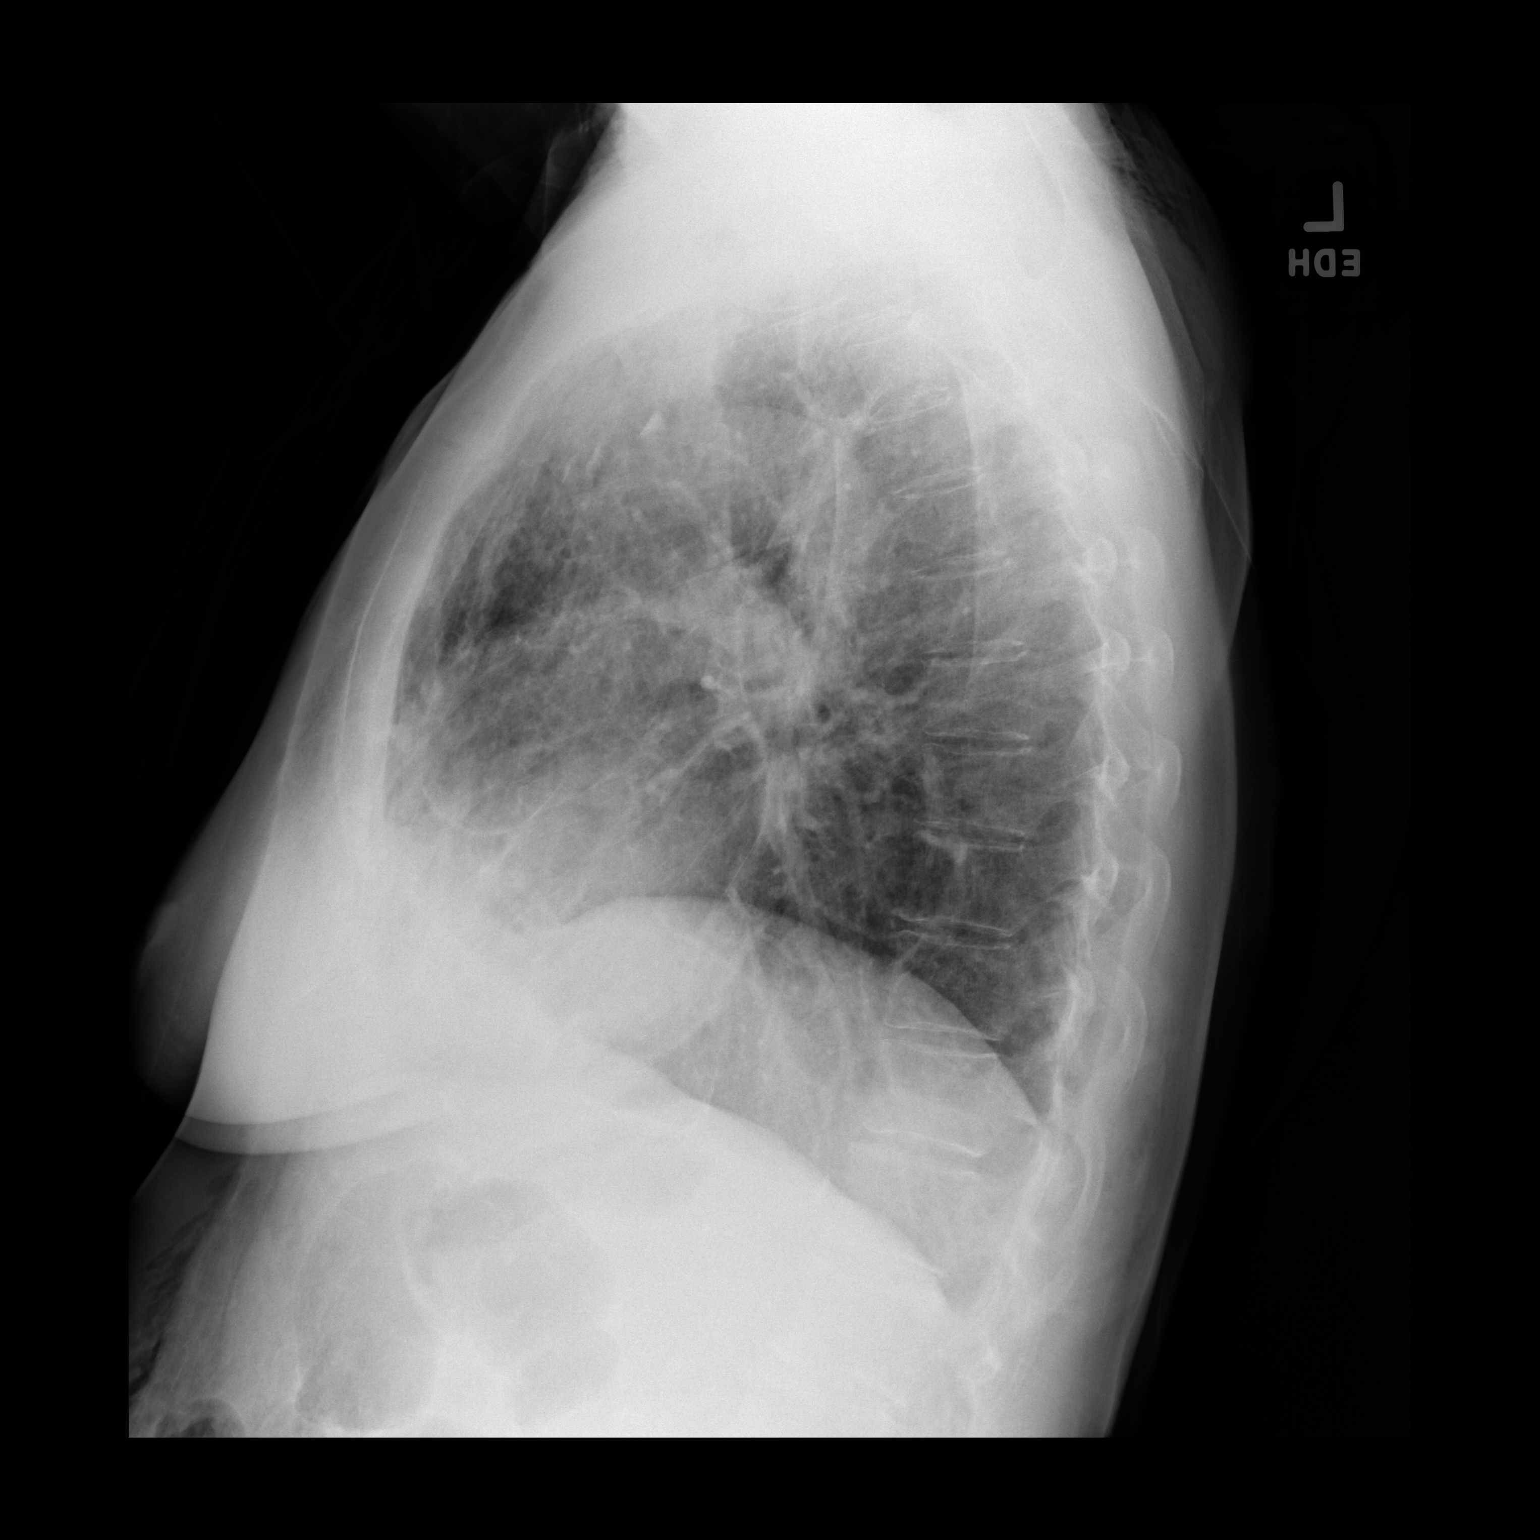

[2 of 2 positions shown; findings below may reference images not displayed]

FINDINGS: Interval removal of right chest tube. Stable right apical
pneumothorax. Stable right lung volume loss post lobectomy. Stable
mild left basilar atelectasis. Cardiomediastinal contours are
stable. Right chest wall subcutaneous emphysema is unchanged.
IMPRESSION: Interval removal of right chest tube. Stable right apical
pneumothorax.

## 2020-11-01 NOTE — Patient Instructions (Signed)
Call immediately if you experience unusual chest pain, shortness of breath, swelling in the chest area.

## 2020-11-01 NOTE — Progress Notes (Signed)
[address]       [place] [address]             [phone]       HPI: [name] returns for a scheduled postoperative follow-up visit after recent right upper lobectomy  [name] is a 70 year old woman with a history of tobacco abuse and mild COPD.  She had a 50-pack-year history of smoking.  She had a CT for lung cancer screening which showed a spiculated right upper lobe lung nodule.  A PET/CT showed the nodule was hypermetabolic and there was a hypermetabolic right paratracheal lymph node.  Navigational bronchoscopy and endobronchial ultrasound documented stage IIIa adenocarcinoma (T1, N2).  She was treated with chemo and immunotherapy.  She had 3 cycles of carboplatin, Alimta, and Keytruda.  Her CT showed a partial response.  I did a robotic right upper lobectomy and node dissection on 10/19/2020.  Postoperatively she had a persistent air leak.  She went home with the tube in place on 10/28/2020.   Since discharge she has been doing well.  She is taking oxycodone about every 5 hours or so.  She is also taking Tylenol some in between.  She has not had any respiratory issues.  They have noticed a little bit of drainage around the chest tube.  She is been draining about 100 mL a day.  Past Medical History:  Diagnosis Date  . Cataract   . COPD (chronic obstructive pulmonary disease) ([place])     Current Outpatient Medications  Medication Sig Dispense Refill  . Calcium Carbonate (CALCIUM 600 PO) Take 600 mg by mouth 2 (two) times daily.     . fluticasone (FLONASE) 50 MCG/ACT nasal spray Place 2 sprays into both nostrils daily. 16 g 12  . Fluticasone-Salmeterol (ADVAIR) 250-50 MCG/DOSE AEPB Inhale 1 puff into the lungs 2 (two) times daily.     Marland Kitchen guaiFENesin (MUCINEX) 600 MG 12 hr tablet Take 1 tablet (600 mg total) by mouth 2 (two) times daily as needed for cough or to loosen phlegm.    . Multiple Vitamin (MULTIVITAMIN) tablet Take 1 tablet by mouth daily.    Marland Kitchen  oxyCODONE (OXY IR/ROXICODONE) 5 MG immediate release tablet Take 1 tablet (5 mg total) by mouth every 4 (four) hours as needed for moderate pain or severe pain. 30 tablet 0  . albuterol (PROVENTIL HFA;VENTOLIN HFA) 108 (90 Base) MCG/ACT inhaler Inhale 2 puffs into the lungs every 4 (four) hours as needed for wheezing or shortness of breath (cough, shortness of breath or wheezing.). (Patient not taking: Reported on 11/01/2020) 1 Inhaler 1  . Cholecalciferol (VITAMIN D3 PO) Take 2,000 Units by mouth daily. (Patient not taking: Reported on 11/01/2020)    . prochlorperazine (COMPAZINE) 10 MG tablet Take 1 tablet (10 mg total) by mouth every 6 (six) hours as needed. (Patient not taking: Reported on 11/01/2020) 30 tablet 2   No current facility-administered medications for this visit.    Physical Exam BP 118/75 (BP Location: Left Arm, Patient Position: Sitting)   Pulse 61   Resp 20   Ht 5\' 3"  (1.6 m)   Wt 133 lb (60.3 kg)   SpO2 95% Comment: RA  BMI 23.42 kg/m  70 year old woman in no acute distress Well-appearing Alert and oriented x3 with no focal deficits Lungs slightly diminished at right base but otherwise clear Chest tube no air leak with repeated coughing Cardiac regular rate and rhythm Chest tube site with erythema other incisions  clean dry and intact  Diagnostic Tests: I personally reviewed the chest x-ray.  It shows a decrease in the right apical space and subcutaneous emphysema compared to her previous film.  Impression: [name] is a 70 year old woman with a history of tobacco abuse and mild COPD who was found to have a lung nodule on a low-dose screening CT.  She turned out to have a stage IIIa adenocarcinoma of the right upper lobe.  She had 3 cycles of neoadjuvant carboplatin, Alimta, and Keytruda.  She had a partial response radiographically.  I did a robotic right upper lobectomy and node dissection on 10/19/2020.  She had an air leak postoperatively went home on day 9 with  a tube in place.  On exam today she does not have any demonstrable air leak with repeated coughing over approximately 10-minute span.  Her chest x-ray is improved.  I think we can safely remove the tube at this point.  She and her husband do understand there is always a possibility the lung could collapse and require placement of a new tube.  Other than the air leak she is doing extremely well.  She does have some incisional pain.  I encouraged him to use Tylenol regularly and then use oxycodone when needed.  I do not want her to drive yet.  I do want her walking but not doing anything much more strenuous than that.  Plan: Remove chest tube in office today, will check chest x-ray afterwards Has scheduled follow-up appointment next Tuesday  [name], MD [facility] [phone]  Procedure Right chest tube removed without difficulty.  Patient tolerated well. We will observe for about 20 minutes and then send for PA lateral chest x-ray.  Revonda Standard Roxan Hockey, MD [facility] [phone]

## 2020-11-02 ENCOUNTER — Other Ambulatory Visit: Payer: Self-pay | Admitting: *Deleted

## 2020-11-02 ENCOUNTER — Other Ambulatory Visit: Payer: Self-pay | Admitting: Thoracic Surgery (Cardiothoracic Vascular Surgery)

## 2020-11-02 MED ORDER — OXYCODONE HCL 5 MG PO TABS
5.0000 mg | ORAL_TABLET | Freq: Four times a day (QID) | ORAL | 0 refills | Status: DC | PRN
Start: 2020-11-02 — End: 2020-11-16

## 2020-11-02 NOTE — Progress Notes (Signed)
The proposed treatment discussed in cancer conference 11/02/20 is for discussion purpose only and is not a binding recommendation.  The patient was not physically examined nor present for their treatment options. Therefore, final treatment plans cannot be decided.

## 2020-11-02 NOTE — Progress Notes (Signed)
Reorder oxycodone 5-10 mg PO q6 PRN, 30 tabs, no refills  Christina Goodroe C. Roxan Hockey, MD [facility] [phone]

## 2020-11-07 ENCOUNTER — Encounter: Payer: Self-pay | Admitting: Thoracic Surgery (Cardiothoracic Vascular Surgery)

## 2020-11-07 ENCOUNTER — Other Ambulatory Visit: Payer: Self-pay | Admitting: Thoracic Surgery (Cardiothoracic Vascular Surgery)

## 2020-11-07 ENCOUNTER — Ambulatory Visit
Admission: RE | Admit: 2020-11-07 | Discharge: 2020-11-07 | Disposition: A | Discharge status: Home / Self Care | Payer: Medicare Other | Source: Ambulatory Visit | Attending: Thoracic Surgery (Cardiothoracic Vascular Surgery) | Admitting: Thoracic Surgery (Cardiothoracic Vascular Surgery)

## 2020-11-07 ENCOUNTER — Ambulatory Visit (INDEPENDENT_AMBULATORY_CARE_PROVIDER_SITE_OTHER): Payer: Self-pay | Admitting: Thoracic Surgery (Cardiothoracic Vascular Surgery)

## 2020-11-07 ENCOUNTER — Encounter: Payer: Self-pay | Admitting: *Deleted

## 2020-11-07 ENCOUNTER — Other Ambulatory Visit: Payer: Self-pay

## 2020-11-07 ENCOUNTER — Ambulatory Visit: Payer: Medicare Other | Admitting: Thoracic Surgery (Cardiothoracic Vascular Surgery)

## 2020-11-07 VITALS — BP 107/73 | HR 68 | Resp 20 | Ht 63.0 in

## 2020-11-07 DIAGNOSIS — Z4682 Encounter for fitting and adjustment of non-vascular catheter: Secondary | ICD-10-CM

## 2020-11-07 DIAGNOSIS — R918 Other nonspecific abnormal finding of lung field: Secondary | ICD-10-CM

## 2020-11-07 DIAGNOSIS — C3411 Malignant neoplasm of upper lobe, right bronchus or lung: Secondary | ICD-10-CM

## 2020-11-07 DIAGNOSIS — Z902 Acquired absence of lung [part of]: Secondary | ICD-10-CM

## 2020-11-07 DIAGNOSIS — Z9889 Other specified postprocedural states: Secondary | ICD-10-CM

## 2020-11-07 IMAGING — CR DG CHEST 2V
2 series · 2 of 2 positions shown · non-contrast
Comparison: Chest radiographs [DATE] and earlier.

CLINICAL DATA: 69-year-old female postoperative day 19 status post
right upper lobectomy and lymph node dissection for non-small cell
carcinoma.

EXAM:
CHEST - 2 VIEW

[w chest pa]
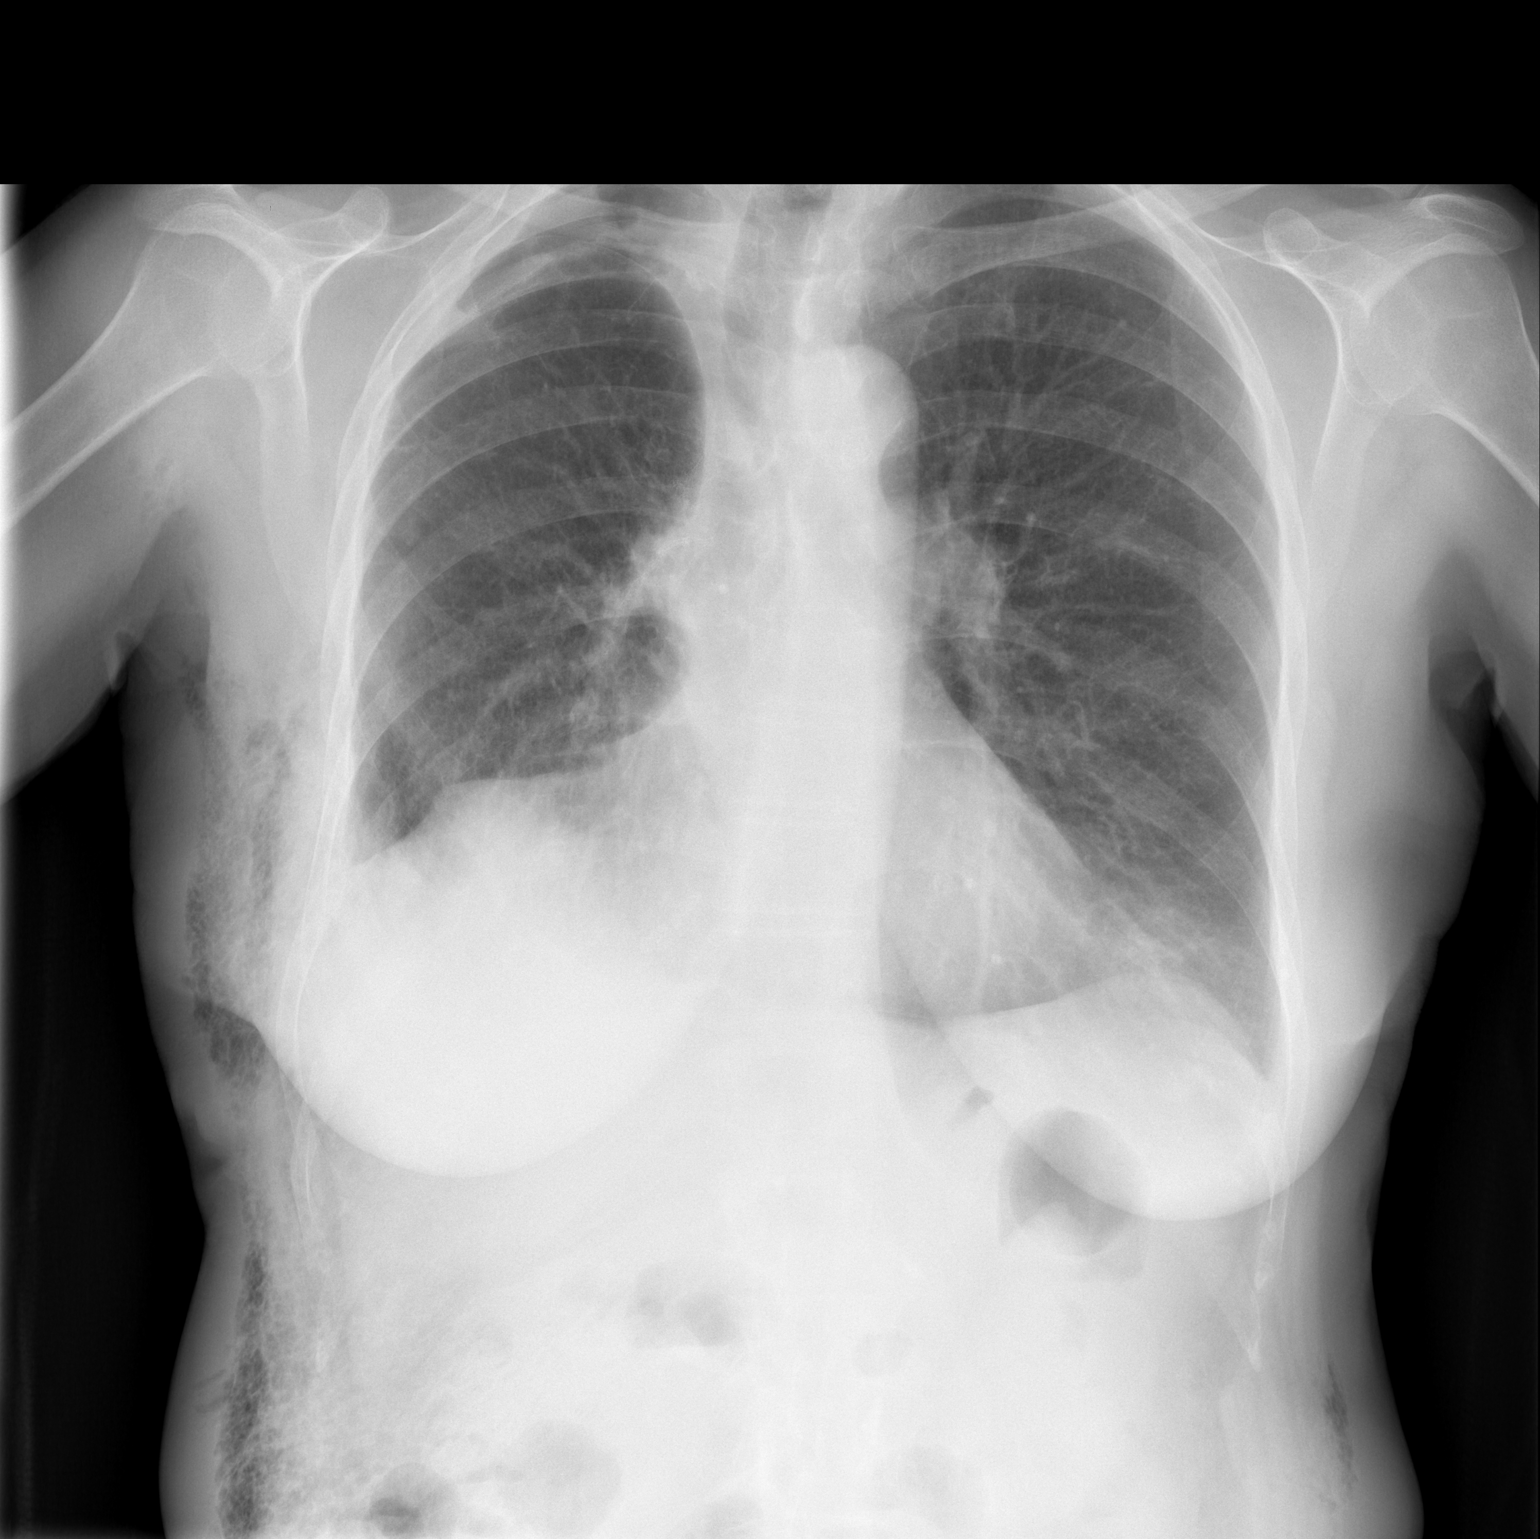

[w chest lat]
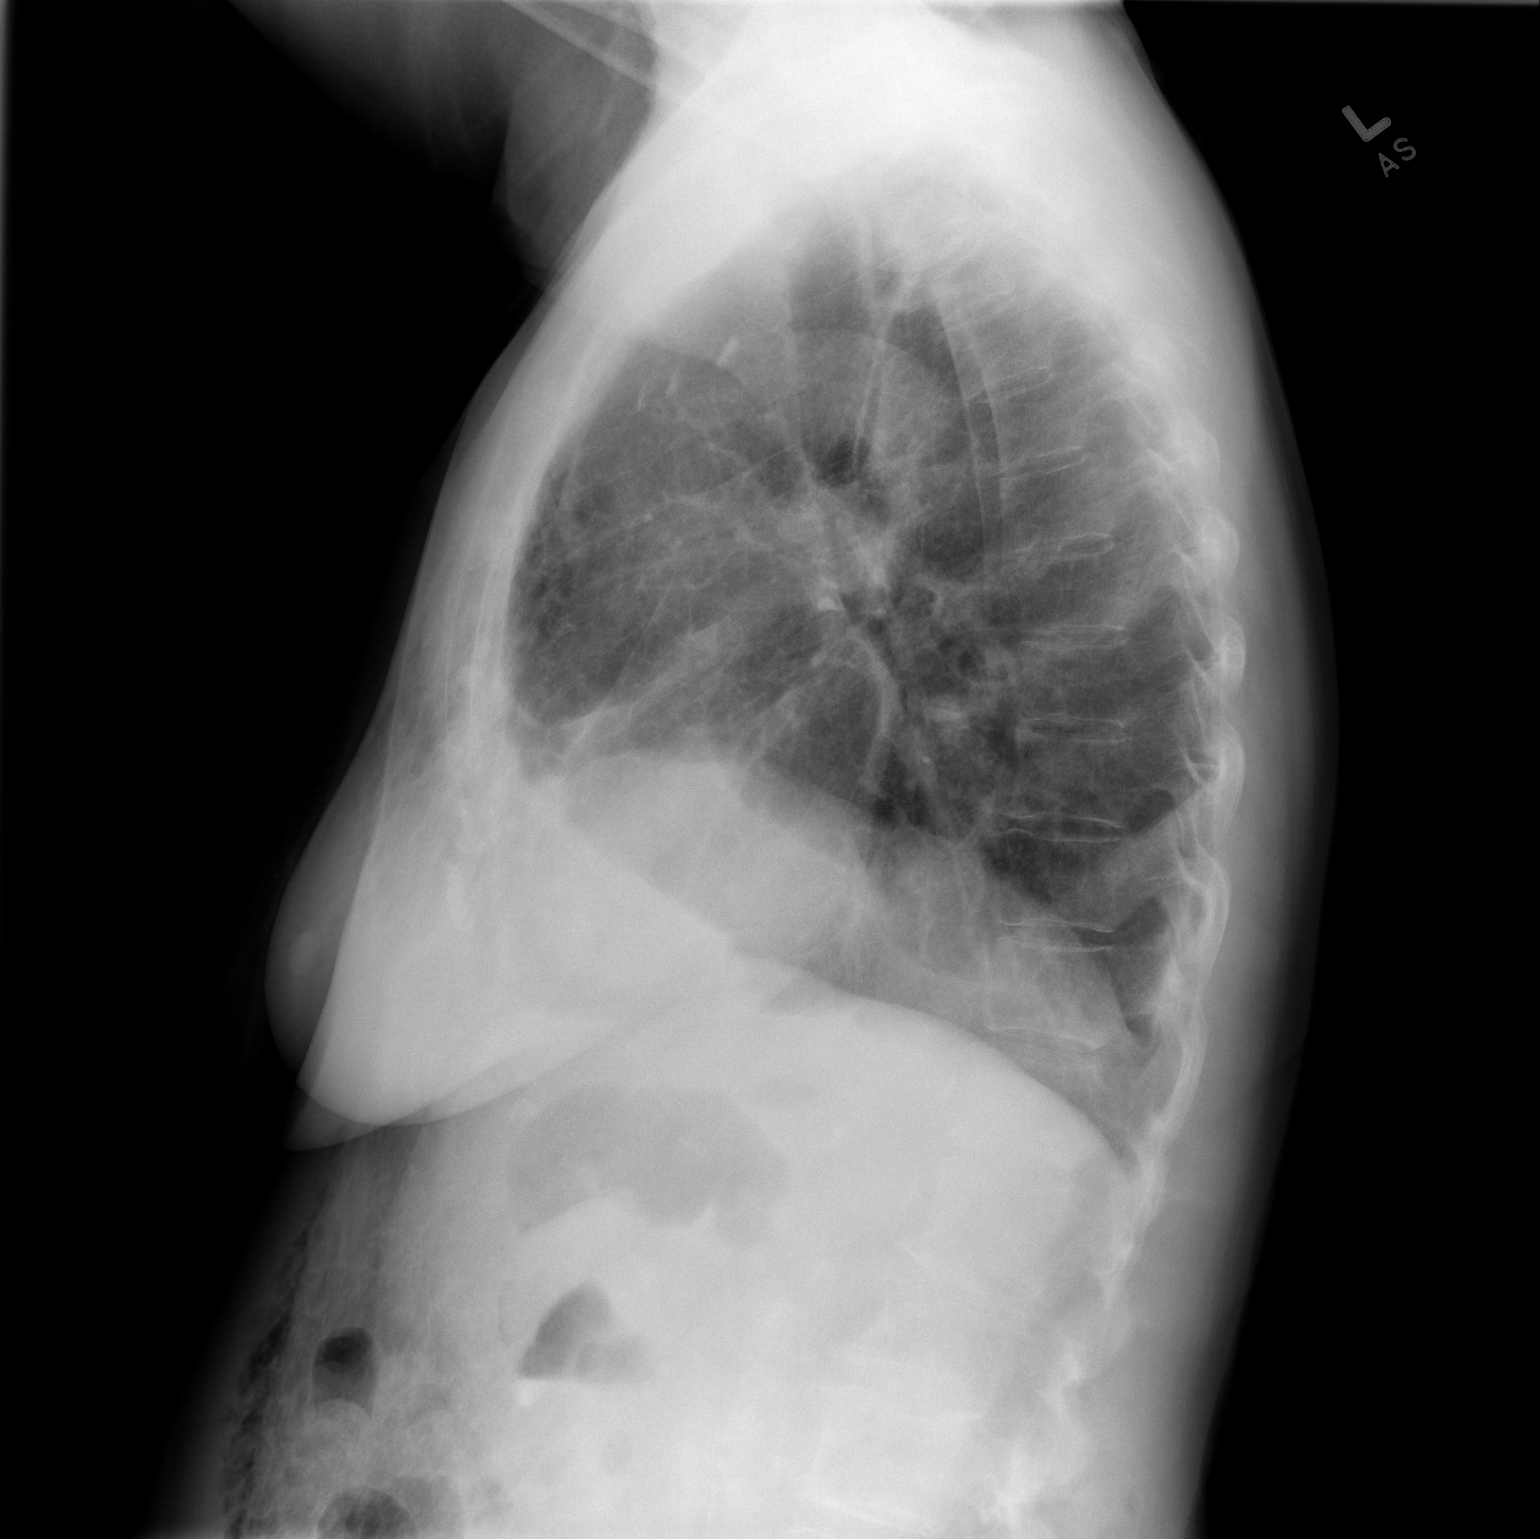

[2 of 2 positions shown; findings below may reference images not displayed]

FINDINGS: Volume loss in the right hemithorax with stable small right apical
pneumothorax. Right chest and abdominal wall subcutaneous gas also
persists, only mildly decreased in the interim. Right lower neck
soft tissue gas has resolved.

Mediastinal contours are stable and within normal limits allowing
for postoperative change at the right hilum. Left lung appears
stable and negative. Stable visualized osseous structures. Abdominal
Calcified aortic atherosclerosis. Negative visible bowel gas
pattern.
IMPRESSION: 1. Following right upper lobectomy last month a small right apical
pneumothorax is stable, although right chest and abdominal wall gas
persists.
2. No new cardiopulmonary abnormality.

## 2020-11-07 NOTE — Progress Notes (Signed)
[name]       [name],[name]             [phone]     HPI:[name] returns for a scheduled follow-up visit after recent right upper lobectomy  [name] is a 70 year old woman with a history of tobacco abuse and mild COPD.  She had a 50-pack-year history of smoking before quitting last January.  She had a CT scan for lung cancer screening which showed a spiculated right upper lobe lung nodule.  On PET CT the nodule was hypermetabolic and there was a hypermetabolic right paratracheal node.  She had navigational bronchoscopy and EBUS which documented T1, N2, stage IIIa adenocarcinoma.  She had neoadjuvant chemotherapy and immunotherapy with 3 cycles of carboplatin, Alimta and Keytruda.  She then underwent a robotic right upper lobectomy and node dissection on 10/19/2020.  Postoperatively she had an air leak and went home with the tube on 10/28/2020.  I saw her in the office last week and there was no air leak.  I removed the chest tube.  Since the chest tube was removed her pain has improved.  She still taking oxycodone but just 1 tablet at night before she goes to bed.  She is using some Tylenol during the day.  She says the pain is more of an aching and tightness along the right flank rather than sharp stabbing pain.  Past Medical History:  Diagnosis Date  . Cataract   . COPD (chronic obstructive pulmonary disease) ([place])     Current Outpatient Medications  Medication Sig Dispense Refill  . albuterol (PROVENTIL HFA;VENTOLIN HFA) 108 (90 Base) MCG/ACT inhaler Inhale 2 puffs into the lungs every 4 (four) hours as needed for wheezing or shortness of breath (cough, shortness of breath or wheezing.). 1 Inhaler 1  . Calcium Carbonate (CALCIUM 600 PO) Take 600 mg by mouth 2 (two) times daily.     . Cholecalciferol (VITAMIN D3 PO) Take 2,000 Units by mouth daily.    . fluticasone (FLONASE) 50 MCG/ACT nasal spray Place 2 sprays into both nostrils daily. 16 g  12  . Fluticasone-Salmeterol (ADVAIR) 250-50 MCG/DOSE AEPB Inhale 1 puff into the lungs 2 (two) times daily.     Marland Kitchen guaiFENesin (MUCINEX) 600 MG 12 hr tablet Take 1 tablet (600 mg total) by mouth 2 (two) times daily as needed for cough or to loosen phlegm.    . Multiple Vitamin (MULTIVITAMIN) tablet Take 1 tablet by mouth daily.    Marland Kitchen oxyCODONE (OXY IR/ROXICODONE) 5 MG immediate release tablet Take 1-2 tablets (5-10 mg total) by mouth every 6 (six) hours as needed for moderate pain or severe pain. 30 tablet 0  . prochlorperazine (COMPAZINE) 10 MG tablet Take 1 tablet (10 mg total) by mouth every 6 (six) hours as needed. 30 tablet 2   No current facility-administered medications for this visit.    Physical Exam BP 107/73 (BP Location: Right Arm, Patient Position: Sitting)   Pulse 68   Resp 20   Ht 5\' 3"  (1.6 m)   SpO2 94% Comment: RA  BMI 23.53 kg/m  70 year old woman in no acute distress Alert and oriented x3 with no focal deficits Lungs diminished at right base otherwise clear Incisions clean dry and intact Cardiac regular rate and rhythm  Diagnostic Tests: I personally reviewed the chest x-ray.  Shows postoperative changes.  Stable from a week ago.  Impression: Brelyn Frederick is a 70 year old woman with a history of  tobacco abuse who was found to have a lung nodule on a low-dose screening CT.  On PET CT there was also a hypermetabolic paratracheal node.  She was confirmed to have a stage IIIa adenocarcinoma with navigational bronchoscopy and endobronchial ultrasound.  She had neoadjuvant treatment with carboplatin, Alimta, and Keytruda for 3 cycles.  She then underwent a robotic right upper lobectomy on 10/19/2020.  She had an air leak postoperatively but that resolved and her tube was removed last week.  Currently she is doing well.  She does still have some incisional discomfort.  She is mostly using Tylenol for that but occasionally is using oxycodone.  She mostly takes the oxycodone at  night before she goes to bed.  Plan at this point she can begin driving on a limited basis although she was cautioned not to drive after taking oxycodone.  Otherwise her activities are unrestricted, but she was cautioned to build into new activities gradually.  She does need to follow-up with Dr. Julien Nordmann to determine when to start of postoperative adjuvant therapy.  Plan: Follow-up with Dr. Julien Nordmann Return in 2 months with PA lateral chest x-ray She knows to call in the interim if she has any issues  [name], MD [facility] [phone]

## 2020-11-07 NOTE — Progress Notes (Signed)
I received a message from Dr. Roxan Hockey that [name] needs follow up to discuss treatment plan.  I notified scheduling to call and schedule her to be seen in 3 weeks with Dr. Julien Nordmann.

## 2020-11-08 ENCOUNTER — Telehealth: Payer: Self-pay | Admitting: Internal Medicine

## 2020-11-08 NOTE — Telephone Encounter (Signed)
Called pt per 2/22 sch msg - pt is aware of appt date and time

## 2020-11-14 ENCOUNTER — Telehealth: Payer: Self-pay

## 2020-11-14 NOTE — Telephone Encounter (Signed)
Patient's husband, Eduard Clos contacted the office requesting patient another refill of pain medication.  He stated that she is currently taking about 2-3 daily and Tylenol as well.  He stated that she had a few days left of oxycodone.  She is s/p RATS RULobectomy 10/19/20 with Dr. Roxan Hockey and has been given a refill.  Advised to have patient try Ibuprofen for pain along with Tylenol to see if she sees any improvement.  He acknowledged receipt and was advised to give the office a call back by Friday for potential refill of pain medication if needed.

## 2020-11-16 ENCOUNTER — Other Ambulatory Visit: Payer: Self-pay | Admitting: Thoracic Surgery (Cardiothoracic Vascular Surgery)

## 2020-11-16 ENCOUNTER — Telehealth: Payer: Self-pay

## 2020-11-16 MED ORDER — TRAMADOL HCL 50 MG PO TABS
50.0000 mg | ORAL_TABLET | Freq: Four times a day (QID) | ORAL | 0 refills | Status: DC | PRN
Start: 2020-11-16 — End: 2020-12-12

## 2020-11-16 NOTE — Telephone Encounter (Signed)
Patient requesting pain medication refill, s/p Right RATS RULobectomy with Dr. Roxan Hockey 10/19/20.  Will refer to Dr. Roxan Hockey for possible refill. Patient aware.

## 2020-11-16 NOTE — Telephone Encounter (Signed)
Patient called and made aware of medication refill sent to preferred pharmacy.

## 2020-11-16 NOTE — Progress Notes (Signed)
Prescription for tramadol 50 mg PO q6 PRN, 28 tablets, no refills  Cybele Maule C. Roxan Hockey, MD [facility] [phone]

## 2020-11-16 NOTE — Telephone Encounter (Signed)
-----   Message from [name], MD sent at 11/16/2020  3:09 PM EST ----- Regarding: RE: Pain medication refill Script for tramadol sent ----- Message ----- From: Donnella Sham, RN Sent: 11/16/2020   2:51 PM EST To: [name], MD Subject: Pain medication refill                         Hey,  Patient called today requesting another refill of pain medication.  Was not opposed to taking Tramadol when asked.  She stated that it is a tight, consistent, dull pain under her breast/ right side.  Having trouble sleeping at night because of pain.  She stated that she was frustrated because she feels like she does well one day and the next not.  She is trying to walk outside and has tried to go longer without taking pain medication.  I believe she may be waiting too long between doses and not staying ahead of it.    Here is her pharmacy if refill approved.  [facility] [place], [place] AT [facility] [address]  Phone:  [phone] Fax:  [phone]   Thanks,  Caryl Pina

## 2020-11-27 ENCOUNTER — Encounter: Payer: Self-pay | Admitting: Internal Medicine

## 2020-11-27 ENCOUNTER — Inpatient Hospital Stay: Payer: Medicare Other

## 2020-11-27 ENCOUNTER — Other Ambulatory Visit: Payer: Self-pay

## 2020-11-27 ENCOUNTER — Inpatient Hospital Stay: Payer: Medicare Other | Attending: Physician Assistant | Admitting: Internal Medicine

## 2020-11-27 VITALS — BP 114/98 | HR 77 | Temp 97.6°F | Resp 18 | Ht 63.0 in | Wt 131.8 lb

## 2020-11-27 DIAGNOSIS — C771 Secondary and unspecified malignant neoplasm of intrathoracic lymph nodes: Secondary | ICD-10-CM | POA: Diagnosis not present

## 2020-11-27 DIAGNOSIS — C3411 Malignant neoplasm of upper lobe, right bronchus or lung: Secondary | ICD-10-CM | POA: Diagnosis not present

## 2020-11-27 DIAGNOSIS — Z5111 Encounter for antineoplastic chemotherapy: Secondary | ICD-10-CM

## 2020-11-27 LAB — CMP (CANCER CENTER ONLY)
ALT: 32 U/L (ref 0–44)
AST: 44 U/L — ABNORMAL HIGH (ref 15–41)
Albumin: 4.4 g/dL (ref 3.5–5.0)
Alkaline Phosphatase: 91 U/L (ref 38–126)
Anion gap: 7 (ref 5–15)
BUN: 7 mg/dL — ABNORMAL LOW (ref 8–23)
CO2: 30 mmol/L (ref 22–32)
Calcium: 10.2 mg/dL (ref 8.9–10.3)
Chloride: 100 mmol/L (ref 98–111)
Creatinine: 0.85 mg/dL (ref 0.44–1.00)
GFR, Estimated: >60 mL/min (ref >60)
Glucose, Bld: 85 mg/dL (ref 70–99)
Potassium: 3.9 mmol/L (ref 3.5–5.1)
Sodium: 137 mmol/L (ref 135–145)
Total Bilirubin: 0.3 mg/dL (ref 0.3–1.2)
Total Protein: 7.5 g/dL (ref 6.5–8.1)

## 2020-11-27 LAB — CBC WITH DIFFERENTIAL (CANCER CENTER ONLY)
Abs Immature Granulocytes: 0.04 10*3/uL (ref 0.00–0.07)
Basophils Absolute: 0.1 10*3/uL (ref 0.0–0.1)
Basophils Relative: 1 %
Eosinophils Absolute: 0.1 10*3/uL (ref 0.0–0.5)
Eosinophils Relative: 1 %
HCT: 36.8 % (ref 36.0–46.0)
Hemoglobin: 12 g/dL (ref 12.0–15.0)
Immature Granulocytes: 1 %
Lymphocytes Relative: 28 %
Lymphs Abs: 1.8 10*3/uL (ref 0.7–4.0)
MCH: 31.3 pg (ref 26.0–34.0)
MCHC: 32.6 g/dL (ref 30.0–36.0)
MCV: 95.8 fL (ref 80.0–100.0)
Monocytes Absolute: 0.4 10*3/uL (ref 0.1–1.0)
Monocytes Relative: 7 %
Neutro Abs: 4.2 10*3/uL (ref 1.7–7.7)
Neutrophils Relative %: 62 %
Platelet Count: 236 10*3/uL (ref 150–400)
RBC: 3.84 MIL/uL — ABNORMAL LOW (ref 3.87–5.11)
RDW: 13.3 % (ref 11.5–15.5)
WBC Count: 6.6 10*3/uL (ref 4.0–10.5)
nRBC: 0 % (ref 0.0–0.2)

## 2020-11-27 MED ORDER — DEXAMETHASONE 4 MG PO TABS: ORAL_TABLET | ORAL | 0 refills | Status: DC

## 2020-11-27 MED ORDER — FOLIC ACID 1 MG PO TABS: 1.0000 mg | ORAL_TABLET | Freq: Every day | ORAL | 4 refills | Status: DC

## 2020-11-27 MED ORDER — CYANOCOBALAMIN 1000 MCG/ML IJ SOLN
1000.0000 ug | Freq: Once | INTRAMUSCULAR | Status: AC
  Administered 2020-11-27: 1000 ug via INTRAMUSCULAR

## 2020-11-27 MED ORDER — CYANOCOBALAMIN 1000 MCG/ML IJ SOLN
INTRAMUSCULAR | Status: AC
  Filled 2020-11-27: qty 1

## 2020-11-27 NOTE — Progress Notes (Signed)
DISCONTINUE OFF PATHWAY REGIMEN - Non-Small Cell Lung   [id number]:Pembrolizumab 200 mg  IV D1 + Pemetrexed 500 mg/m2 IV D1 + Carboplatin AUC=5 IV D1 q21 Days:   A cycle is every 21 days:     Pembrolizumab      Pemetrexed      Carboplatin   **Always confirm dose/schedule in your pharmacy ordering system**  REASON: Other Reason PRIOR TREATMENT: Off Pathway: Pembrolizumab 200 mg  IV D1 + Pemetrexed 500 mg/m2 IV D1 + Carboplatin AUC=5 IV D1 q21 Days TREATMENT RESPONSE: Partial Response (PR)  START ON PATHWAY REGIMEN - Non-Small Cell Lung     A cycle is every 21 days:     Pemetrexed      Carboplatin   **Always confirm dose/schedule in your pharmacy ordering system**  Patient Characteristics: Postoperative without Neoadjuvant Therapy (Pathologic Staging), Stage III, Adjuvant Chemotherapy, Nonsquamous Cell Therapeutic Status: Postoperative without Neoadjuvant Therapy (Pathologic Staging) AJCC T Category: pT1a AJCC N Category: pN2 AJCC M Category: cM0 AJCC 8 Stage Grouping: IIIA Histology: Nonsquamous Cell Intent of Therapy: Curative Intent, Discussed with Patient

## 2020-11-27 NOTE — Progress Notes (Signed)
[facility] [phone]   [phone]  OFFICE PROGRESS NOTE  [name], [address] [place] [address]  DIAGNOSIS: Stage IIIA (T1b, N2, M0) non-small cell lung cancer, adenocarcinoma presented with right upper lobe lung nodule as well as right upper paratracheal adenopathy and suspicious tiny nodule in the right middle lobe.  PRIOR THERAPY:  1) Neoadjuvant systemic chemotherapy with carboplatin for AUC of 5, Alimta 500 mg/M2 and Keytruda 200 mg IV every 3 weeks.  First dose 05/30/2020. Status post 3 cycles. 2) status post robotic assisted right thoracoscopy with right upper lobectomy and lymph node dissection under the care of Dr. Roxan Hockey on October 19, 2020.  There was residual tumor measuring 0.5 cm with lymph node metastasis involving 4R, 12 and hilar lymph nodes.    CURRENT THERAPY: None.  INTERVAL HISTORY: [name] 70 y.o. female returns to the clinic today for follow-up visit accompanied by her husband.  The patient is feeling fine today with no concerning complaints.  She denied having any current chest pain, shortness of breath, cough or hemoptysis.  She denied having any weight loss or night sweats.  She has no nausea, vomiting, diarrhea or constipation.  She has no headache or visual changes.  The patient underwent right upper lobectomy with lymph node dissection on October 19, 2020.  Her surgery was complicated by subcutaneous emphysema.  She is here today for evaluation and discussion of her scan results.  MEDICAL HISTORY: Past Medical History:  Diagnosis Date  . Cataract   . COPD (chronic obstructive pulmonary disease) (HCC)     ALLERGIES:  has No Known Allergies.  MEDICATIONS:  Current Outpatient Medications  Medication Sig Dispense Refill  . albuterol (PROVENTIL HFA;VENTOLIN HFA) 108 (90 Base) MCG/ACT inhaler Inhale 2 puffs into the lungs every 4 (four) hours as needed for wheezing or  shortness of breath (cough, shortness of breath or wheezing.). 1 Inhaler 1  . Calcium Carbonate (CALCIUM 600 PO) Take 600 mg by mouth 2 (two) times daily.     . Cholecalciferol (VITAMIN D3 PO) Take 2,000 Units by mouth daily.    . fluticasone (FLONASE) 50 MCG/ACT nasal spray Place 2 sprays into both nostrils daily. 16 g 12  . Fluticasone-Salmeterol (ADVAIR) 250-50 MCG/DOSE AEPB Inhale 1 puff into the lungs 2 (two) times daily.     Marland Kitchen guaiFENesin (MUCINEX) 600 MG 12 hr tablet Take 1 tablet (600 mg total) by mouth 2 (two) times daily as needed for cough or to loosen phlegm.    . Multiple Vitamin (MULTIVITAMIN) tablet Take 1 tablet by mouth daily.    . prochlorperazine (COMPAZINE) 10 MG tablet Take 1 tablet (10 mg total) by mouth every 6 (six) hours as needed. 30 tablet 2  . traMADol (ULTRAM) 50 MG tablet Take 1 tablet (50 mg total) by mouth every 6 (six) hours as needed for up to 28 days. 40 tablet 0  . traZODone (DESYREL) 50 MG tablet Take 25-50 mg by mouth at bedtime.     No current facility-administered medications for this visit.    SURGICAL HISTORY:  Past Surgical History:  Procedure Laterality Date  . APPENDECTOMY  2008  . COLON SURGERY  2008   colostomy-infection post op appendectomy  . COLOSTOMY REVERSAL  2009  . INTERCOSTAL NERVE BLOCK Right 10/19/2020   Procedure: INTERCOSTAL NERVE BLOCK;  Surgeon: [name], MD;  Location: [facility];  Service: Thoracic;  Laterality: Right;  .  NODE DISSECTION Right 10/19/2020   Procedure: NODE DISSECTION;  Surgeon: [name], MD;  Location: [facility];  Service: Thoracic;  Laterality: Right;  Marland Kitchen VIDEO BRONCHOSCOPY WITH ENDOBRONCHIAL NAVIGATION N/A 05/08/2020   Procedure: VIDEO BRONCHOSCOPY WITH ENDOBRONCHIAL NAVIGATION;  Surgeon: [name], MD;  Location: [facility];  Service: Thoracic;  Laterality: N/A;  . VIDEO BRONCHOSCOPY WITH ENDOBRONCHIAL ULTRASOUND N/A 05/08/2020   Procedure: VIDEO BRONCHOSCOPY WITH ENDOBRONCHIAL ULTRASOUND;   Surgeon: [name], MD;  Location: [facility];  Service: Thoracic;  Laterality: N/A;    REVIEW OF SYSTEMS:  Constitutional: positive for fatigue Eyes: negative Ears, nose, mouth, throat, and face: negative Respiratory: positive for pleurisy/chest pain Cardiovascular: negative Gastrointestinal: negative Genitourinary:negative Integument/breast: negative Hematologic/lymphatic: negative Musculoskeletal:negative Neurological: negative Behavioral/Psych: negative Endocrine: negative Allergic/Immunologic: negative   PHYSICAL EXAMINATION: General appearance: alert, cooperative and no distress Head: Normocephalic, without obvious abnormality, atraumatic Neck: no adenopathy, no JVD, supple, symmetrical, trachea midline and thyroid not enlarged, symmetric, no tenderness/mass/nodules Lymph nodes: Cervical, supraclavicular, and axillary nodes normal. Resp: clear to auscultation bilaterally Back: symmetric, no curvature. ROM normal. No CVA tenderness. Cardio: regular rate and rhythm, S1, S2 normal, no murmur, click, rub or gallop GI: soft, non-tender; bowel sounds normal; no masses,  no organomegaly Extremities: extremities normal, atraumatic, no cyanosis or edema Neurologic: Alert and oriented X 3, normal strength and tone. Normal symmetric reflexes. Normal coordination and gait  ECOG PERFORMANCE STATUS: 1 - Symptomatic but completely ambulatory  Blood pressure (!) 114/98, pulse 77, temperature 97.6 F (36.4 C), temperature source Tympanic, resp. rate 18, height 5\' 3"  (1.6 m), weight 131 lb 12.8 oz (59.8 kg), SpO2 96 %.  LABORATORY DATA: Lab Results  Component Value Date   WBC 6.6 11/27/2020   HGB 12.0 11/27/2020   HCT 36.8 11/27/2020   MCV 95.8 11/27/2020   PLT 236 11/27/2020      Chemistry      Component Value Date/Time   NA 137 11/27/2020 1015   NA 135 10/29/2017 1131   K 3.9 11/27/2020 1015   CL 100 11/27/2020 1015   CO2 30 11/27/2020 1015   BUN 7 (L) 11/27/2020 1015    BUN 5 (L) 10/29/2017 1131   CREATININE 0.85 11/27/2020 1015      Component Value Date/Time   CALCIUM 10.2 11/27/2020 1015   ALKPHOS 91 11/27/2020 1015   AST 44 (H) 11/27/2020 1015   ALT 32 11/27/2020 1015   BILITOT 0.3 11/27/2020 1015       RADIOGRAPHIC STUDIES: DG Chest 2 View  Result Date: 11/07/2020 CLINICAL DATA:  70 year old female postoperative day 19 status post right upper lobectomy and lymph node dissection for non-small cell carcinoma. EXAM: CHEST - 2 VIEW COMPARISON:  Chest radiographs 11/01/2020 and earlier. FINDINGS: Volume loss in the right hemithorax with stable small right apical pneumothorax. Right chest and abdominal wall subcutaneous gas also persists, only mildly decreased in the interim. Right lower neck soft tissue gas has resolved. Mediastinal contours are stable and within normal limits allowing for postoperative change at the right hilum. Left lung appears stable and negative. Stable visualized osseous structures. Abdominal Calcified aortic atherosclerosis. Negative visible bowel gas pattern. IMPRESSION: 1. Following right upper lobectomy last month a small right apical pneumothorax is stable, although right chest and abdominal wall gas persists. 2. No new cardiopulmonary abnormality. Electronically Signed   By: Genevie Ann M.D.   On: 11/07/2020 09:29   DG Chest 2 View  Result Date: 11/01/2020 CLINICAL DATA:  Post chest tube removal  EXAM: CHEST - 2 VIEW COMPARISON:  Earlier same day FINDINGS: Interval removal of right chest tube. Stable right apical pneumothorax. Stable right lung volume loss post lobectomy. Stable mild left basilar atelectasis. Cardiomediastinal contours are stable. Right chest wall subcutaneous emphysema is unchanged. IMPRESSION: Interval removal of right chest tube. Stable right apical pneumothorax. Electronically Signed   By: Macy Mis M.D.   On: 11/01/2020 15:52   DG Chest 2 View  Result Date: 11/01/2020 CLINICAL DATA:  Lung nodule EXAM:  CHEST - 2 VIEW COMPARISON:  10/28/2020 FINDINGS: Right apical chest tube remains present. Decreased small right pneumothorax. Volume loss in the right chest. No new consolidation. Decreased atelectasis at the left lung base. No pleural effusion. Decreased chest wall emphysema. IMPRESSION: Decreased small right pneumothorax with chest tube remaining present. Decreased chest wall emphysema. Decreased left basilar atelectasis. Electronically Signed   By: Macy Mis M.D.   On: 11/01/2020 14:40    ASSESSMENT AND PLAN: This is a very pleasant 70 years old white female recently diagnosed with a stage IIIa (T1b, N2, M0) non-small cell lung cancer, adenocarcinoma presented with right upper lobe lung nodule in addition to mediastinal lymphadenopathy. The patient had MRI of the brain performed recently that showed no evidence of intracranial metastatic disease. The patient underwent a course of neoadjuvant treatment with carboplatin for AUC of 5, Alimta 500 mg/M2 and Keytruda 200 mg IV every 3 weeks for 3 cycles.   She tolerated this treatment well with no concerning adverse effect except for dry skin and itching. Her repeat CT scan after the neoadjuvant treatment showed partial response with around 50% reduction in the tumor volume. The patient underwent right upper lobectomy with lymph node dissection on October 19, 2020 and the final pathology showed residual tumor measuring 0.5 cm with lymph node metastasis involving hilar and mediastinal lymph nodes. I had a lengthy discussion with the patient today about her current condition and treatment options. With the residual disease, I recommended for the patient to proceed with 4 cycles of adjuvant systemic chemotherapy with carboplatin for AUC of 5, Alimta 500 mg/M2 every 3 weeks since she has good response to this treatment in the past.  She is expected to start the first cycle of this treatment on December 11, 2020. I discussed with the patient the adverse effect  of this treatment including but not limited to alopecia, myelosuppression, nausea and vomiting, peripheral neuropathy, liver or renal dysfunction. The patient will come back for follow-up visit at that time. She will receive vitamin B12 injection today. I will call her pharmacy with prescription for folic acid and Decadron premedication. She was advised to call immediately if she has any concerning symptoms in the interval. The patient voices understanding of current disease status and treatment options and is in agreement with the current care plan.  All questions were answered. The patient knows to call the clinic with any problems, questions or concerns. We can certainly see the patient much sooner if necessary.   Disclaimer: This note was dictated with voice recognition software. Similar sounding words can inadvertently be transcribed and may not be corrected upon review.

## 2020-11-29 ENCOUNTER — Telehealth: Payer: Self-pay | Admitting: Internal Medicine

## 2020-11-29 NOTE — Telephone Encounter (Signed)
Scheduled per los. Called and spoke with patient. Confirmed appts. Advised patient that I will call her back once 3/28 infusion is approved

## 2020-12-01 ENCOUNTER — Telehealth: Payer: Self-pay | Admitting: Internal Medicine

## 2020-12-01 NOTE — Telephone Encounter (Signed)
Called and spoke with patient to give time of 3/28 appt

## 2020-12-06 NOTE — Progress Notes (Signed)
[facility] OFFICE PROGRESS NOTE  [name], [address] [place] [address]  DIAGNOSIS: Stage IIIA (T1b, N2, M0) non-small cell lung cancer, adenocarcinoma presented with right upper lobe lung nodule as well as right upper paratracheal adenopathy and suspicious tiny nodule in the right middle lobe.  PRIOR THERAPY: 1) Neoadjuvant systemic chemotherapy with carboplatin for AUC of 5, Alimta 500 mg/M2 and Keytruda 200 mg IV every 3 weeks.  First dose 05/30/2020. Status post 3 cycles. 2) status post robotic assisted right thoracoscopy with right upper lobectomy and lymph node dissection under the care of Dr. Roxan Hockey on October 19, 2020.  There was residual tumor measuring 0.5 cm with lymph node metastasis involving 4R, 12 and hilar lymph nodes.  CURRENT THERAPY:  4 cycles of adjuvant systemic chemotherapy with carboplatin for AUC of 5, Alimta 500 mg/M2 every 3 weeks. She is here for cycle #1 today. First dose on 12/11/20.  INTERVAL HISTORY: [name] 70 y.o. female returns to the clinic today for a follow-up visit. The patient previously completed 4 cycles of neoadjuvant chemotherapy. She then underwent RUL lobectomy. She had a good response to treatment but the final pathology showed residual tumor measuring 0.5 cm with lymph node metastasis involving hilar and mediastinal lymph nodes. Therefore, she is currently undergoing 4 more cycles of adjuvant chemotherapy. She is here for cycle #1 today. She took her premedications with decadron as prescribed. She is also taking her folic acid as prescribed.  The patient is feeling well today without any concerning complaints. Denies any fever, chills, night sweats, or weight loss. She has a very mild cough once a week for so. She quit smoking in January. Denies any chest pain, shortness of breath, cough, or hemoptysis. She sees Dr. Roxan Hockey for a follow up appointment next month on 01/02/21. Denies any nausea,  vomiting, diarrhea, or constipation. Denies any headache or visual changes. Denies any rashes or skin changes. The patient is here today for evaluation prior to starting cycle # 1    MEDICAL HISTORY: Past Medical History:  Diagnosis Date  . Cataract   . COPD (chronic obstructive pulmonary disease) (HCC)     ALLERGIES:  has No Known Allergies.  MEDICATIONS:  Current Outpatient Medications  Medication Sig Dispense Refill  . albuterol (PROVENTIL HFA;VENTOLIN HFA) 108 (90 Base) MCG/ACT inhaler Inhale 2 puffs into the lungs every 4 (four) hours as needed for wheezing or shortness of breath (cough, shortness of breath or wheezing.). 1 Inhaler 1  . Calcium Carbonate (CALCIUM 600 PO) Take 600 mg by mouth 2 (two) times daily.     . Cholecalciferol (VITAMIN D3 PO) Take 2,000 Units by mouth daily.    Marland Kitchen dexamethasone (DECADRON) 4 MG tablet 1 tablet p.o. twice daily the day before, day of and day after chemotherapy every 3 weeks. 40 tablet 0  . fluticasone (FLONASE) 50 MCG/ACT nasal spray Place 2 sprays into both nostrils daily. 16 g 12  . Fluticasone-Salmeterol (ADVAIR) 250-50 MCG/DOSE AEPB Inhale 1 puff into the lungs 2 (two) times daily.     . folic acid (FOLVITE) 1 MG tablet Take 1 tablet (1 mg total) by mouth daily. 30 tablet 4  . guaiFENesin (MUCINEX) 600 MG 12 hr tablet Take 1 tablet (600 mg total) by mouth 2 (two) times daily as needed for cough or to loosen phlegm.    . Multiple Vitamin (MULTIVITAMIN) tablet Take 1 tablet by mouth daily.    . prochlorperazine (COMPAZINE) 10  MG tablet Take 1 tablet (10 mg total) by mouth every 6 (six) hours as needed. 30 tablet 2  . traMADol (ULTRAM) 50 MG tablet Take 1 tablet (50 mg total) by mouth every 6 (six) hours as needed for up to 28 days. 40 tablet 0  . traZODone (DESYREL) 50 MG tablet Take 25-50 mg by mouth at bedtime.     No current facility-administered medications for this visit.    SURGICAL HISTORY:  Past Surgical History:  Procedure  Laterality Date  . APPENDECTOMY  2008  . COLON SURGERY  2008   colostomy-infection post op appendectomy  . COLOSTOMY REVERSAL  2009  . INTERCOSTAL NERVE BLOCK Right 10/19/2020   Procedure: INTERCOSTAL NERVE BLOCK;  Surgeon: [name], MD;  Location: [facility];  Service: Thoracic;  Laterality: Right;  . NODE DISSECTION Right 10/19/2020   Procedure: NODE DISSECTION;  Surgeon: [name], MD;  Location: [facility];  Service: Thoracic;  Laterality: Right;  Marland Kitchen VIDEO BRONCHOSCOPY WITH ENDOBRONCHIAL NAVIGATION N/A 05/08/2020   Procedure: VIDEO BRONCHOSCOPY WITH ENDOBRONCHIAL NAVIGATION;  Surgeon: [name], MD;  Location: [facility];  Service: Thoracic;  Laterality: N/A;  . VIDEO BRONCHOSCOPY WITH ENDOBRONCHIAL ULTRASOUND N/A 05/08/2020   Procedure: VIDEO BRONCHOSCOPY WITH ENDOBRONCHIAL ULTRASOUND;  Surgeon: [name], MD;  Location: [facility];  Service: Thoracic;  Laterality: N/A;    REVIEW OF SYSTEMS:   Review of Systems  Constitutional: Negative for appetite change, chills, fatigue, fever and unexpected weight change.  HENT: Negative for mouth sores, nosebleeds, sore throat and trouble swallowing.   Eyes: Negative for eye problems and icterus.  Respiratory: Negative for cough, hemoptysis, shortness of breath and wheezing.   Cardiovascular: Negative for chest pain and leg swelling.  Gastrointestinal: Negative for abdominal pain, constipation, diarrhea, nausea and vomiting.  Genitourinary: Negative for bladder incontinence, difficulty urinating, dysuria, frequency and hematuria.   Musculoskeletal: Negative for back pain, gait problem, neck pain and neck stiffness.  Skin: Negative for itching and rash.  Neurological: Negative for dizziness, extremity weakness, gait problem, headaches, light-headedness and seizures.  Hematological: Negative for adenopathy. Does not bruise/bleed easily.  Psychiatric/Behavioral: Negative for confusion, depression and sleep disturbance. The  patient is not nervous/anxious.     PHYSICAL EXAMINATION:  Blood pressure (!) 142/66, pulse 70, temperature 98.2 F (36.8 C), temperature source Oral, resp. rate 18, weight 130 lb (59 kg), SpO2 95 %.  ECOG PERFORMANCE STATUS: 1 - Symptomatic but completely ambulatory  Physical Exam  Constitutional: Oriented to person, place, and time and well-developed, well-nourished, and in no distress. HENT:  Head: Normocephalic and atraumatic.  Mouth/Throat: Oropharynx is clear and moist. No oropharyngeal exudate.  Eyes: Conjunctivae are normal. Right eye exhibits no discharge. Left eye exhibits no discharge. No scleral icterus.  Neck: Normal range of motion. Neck supple.  Cardiovascular: Normal rate, regular rhythm, normal heart sounds and intact distal pulses.   Pulmonary/Chest: Effort normal and breath sounds normal. No respiratory distress. No wheezes. No rales.  Abdominal: Soft. Bowel sounds are normal. Exhibits no distension and no mass. There is no tenderness.  Musculoskeletal: Normal range of motion. Exhibits no edema.  Lymphadenopathy:    No cervical adenopathy.  Neurological: Alert and oriented to person, place, and time. Exhibits normal muscle tone. Gait normal. Coordination normal.  Skin: Skin is warm and dry. No rash noted. Not diaphoretic. No erythema. No pallor.  Psychiatric: Mood, memory and judgment normal.  Vitals reviewed.  LABORATORY DATA: Lab Results  Component Value Date   WBC  10.5 12/11/2020   HGB 12.0 12/11/2020   HCT 35.3 (L) 12/11/2020   MCV 93.1 12/11/2020   PLT 212 12/11/2020      Chemistry      Component Value Date/Time   NA 138 12/11/2020 1019   NA 135 10/29/2017 1131   K 4.0 12/11/2020 1019   CL 100 12/11/2020 1019   CO2 24 12/11/2020 1019   BUN 9 12/11/2020 1019   BUN 5 (L) 10/29/2017 1131   CREATININE 0.83 12/11/2020 1019      Component Value Date/Time   CALCIUM 9.8 12/11/2020 1019   ALKPHOS 88 12/11/2020 1019   AST 48 (H) 12/11/2020 1019   ALT  52 (H) 12/11/2020 1019   BILITOT 0.5 12/11/2020 1019       RADIOGRAPHIC STUDIES:  No results found.   ASSESSMENT/PLAN:  This is a very pleasant 70 year old Caucasian female recently diagnosed with stage IIIa (T1b,N2, M0) non-small cell lung cancer, adenocarcinoma. She presented with a right upper lobe lung nodule in addition to mediastinal lymphadenopathy.She was diagnosed in August 2021.  The patient underwent a course of neoadjuvant treatment with carboplatin for AUC of 5, Alimta 500 mg/M2 and Keytruda 200 mg IV every 3 weeks for 3 cycles.   She tolerated this treatment well with no concerning adverse effect except for dry skin and itching.  Her repeat CT scan after the neoadjuvant treatment showed partial response with around 50% reduction in the tumor volume.  The patient underwent right upper lobectomy with lymph node dissection on October 19, 2020 under the care of Dr. Roxan Hockey.   the final pathology showed residual tumor measuring 0.5 cm with lymph node metastasis involving hilar and mediastinal lymph nodes. Therefore, Dr. Julien Nordmann recommended 4 cycles of adjuvant chemotherapy with carboplatin for AUC of 5, Alimta 500 mg/M2 every 3 weeks since she has good response to this treatment in the past.  She is expected to start the first cycle of this treatment today, on December 11, 2020.   Labs were reviewed. Recommend that she proceed with cycle #1 today as scheduled.   We will see her back for a follow up visit in 3 weeks for evaluation before starting cycle #2.   She will continue taking her decadron premedications as prescribed.   The patient was advised to call immediately if she has any concerning symptoms in the interval. The patient voices understanding of current disease status and treatment options and is in agreement with the current care plan. All questions were answered. The patient knows to call the clinic with any problems, questions or concerns. We can certainly see  the patient much sooner if necessary      No orders of the defined types were placed in this encounter.    I spent 20-29 minutes in this encounter.   Saim Almanza L Aragorn Recker, PA-C 12/11/20

## 2020-12-11 ENCOUNTER — Other Ambulatory Visit: Payer: Self-pay

## 2020-12-11 ENCOUNTER — Inpatient Hospital Stay: Payer: Medicare Other | Admitting: Physician Assistant

## 2020-12-11 ENCOUNTER — Inpatient Hospital Stay: Payer: Medicare Other

## 2020-12-11 VITALS — BP 142/66 | HR 70 | Temp 98.2°F | Resp 18 | Wt 130.0 lb

## 2020-12-11 DIAGNOSIS — Z5111 Encounter for antineoplastic chemotherapy: Secondary | ICD-10-CM | POA: Diagnosis not present

## 2020-12-11 DIAGNOSIS — C3411 Malignant neoplasm of upper lobe, right bronchus or lung: Secondary | ICD-10-CM | POA: Diagnosis not present

## 2020-12-11 LAB — CMP (CANCER CENTER ONLY)
ALT: 52 U/L — ABNORMAL HIGH (ref 0–44)
AST: 48 U/L — ABNORMAL HIGH (ref 15–41)
Albumin: 4.8 g/dL (ref 3.5–5.0)
Alkaline Phosphatase: 88 U/L (ref 38–126)
Anion gap: 14 (ref 5–15)
BUN: 9 mg/dL (ref 8–23)
CO2: 24 mmol/L (ref 22–32)
Calcium: 9.8 mg/dL (ref 8.9–10.3)
Chloride: 100 mmol/L (ref 98–111)
Creatinine: 0.83 mg/dL (ref 0.44–1.00)
GFR, Estimated: >60 mL/min (ref >60)
Glucose, Bld: 102 mg/dL — ABNORMAL HIGH (ref 70–99)
Potassium: 4 mmol/L (ref 3.5–5.1)
Sodium: 138 mmol/L (ref 135–145)
Total Bilirubin: 0.5 mg/dL (ref 0.3–1.2)
Total Protein: 7.8 g/dL (ref 6.5–8.1)

## 2020-12-11 LAB — CBC WITH DIFFERENTIAL (CANCER CENTER ONLY)
Abs Immature Granulocytes: 0.07 10*3/uL (ref 0.00–0.07)
Basophils Absolute: 0 10*3/uL (ref 0.0–0.1)
Basophils Relative: 0 %
Eosinophils Absolute: 0 10*3/uL (ref 0.0–0.5)
Eosinophils Relative: 0 %
HCT: 35.3 % — ABNORMAL LOW (ref 36.0–46.0)
Hemoglobin: 12 g/dL (ref 12.0–15.0)
Immature Granulocytes: 1 %
Lymphocytes Relative: 11 %
Lymphs Abs: 1.2 10*3/uL (ref 0.7–4.0)
MCH: 31.7 pg (ref 26.0–34.0)
MCHC: 34 g/dL (ref 30.0–36.0)
MCV: 93.1 fL (ref 80.0–100.0)
Monocytes Absolute: 0.4 10*3/uL (ref 0.1–1.0)
Monocytes Relative: 4 %
Neutro Abs: 8.8 10*3/uL — ABNORMAL HIGH (ref 1.7–7.7)
Neutrophils Relative %: 84 %
Platelet Count: 212 10*3/uL (ref 150–400)
RBC: 3.79 MIL/uL — ABNORMAL LOW (ref 3.87–5.11)
RDW: 14.1 % (ref 11.5–15.5)
WBC Count: 10.5 10*3/uL (ref 4.0–10.5)
nRBC: 0 % (ref 0.0–0.2)

## 2020-12-11 MED ORDER — SODIUM CHLORIDE 0.9 % IV SOLN
500.0000 mg/m2 | Freq: Once | INTRAVENOUS | Status: AC
  Administered 2020-12-11: 800 mg via INTRAVENOUS
  Filled 2020-12-11: qty 20

## 2020-12-11 MED ORDER — SODIUM CHLORIDE 0.9 % IV SOLN
10.0000 mg | Freq: Once | INTRAVENOUS | Status: AC
  Administered 2020-12-11: 10 mg via INTRAVENOUS
  Filled 2020-12-11: qty 10

## 2020-12-11 MED ORDER — PALONOSETRON HCL INJECTION 0.25 MG/5ML
0.2500 mg | Freq: Once | INTRAVENOUS | Status: AC
  Administered 2020-12-11: 0.25 mg via INTRAVENOUS

## 2020-12-11 MED ORDER — FOSAPREPITANT DIMEGLUMINE INJECTION 150 MG
150.0000 mg | Freq: Once | INTRAVENOUS | Status: AC
  Administered 2020-12-11: 150 mg via INTRAVENOUS
  Filled 2020-12-11: qty 150

## 2020-12-11 MED ORDER — SODIUM CHLORIDE 0.9 % IV SOLN
370.0000 mg | Freq: Once | INTRAVENOUS | Status: AC
  Administered 2020-12-11: 370 mg via INTRAVENOUS
  Filled 2020-12-11: qty 37

## 2020-12-11 MED ORDER — PALONOSETRON HCL INJECTION 0.25 MG/5ML
INTRAVENOUS | Status: AC
  Filled 2020-12-11: qty 5

## 2020-12-11 MED ORDER — SODIUM CHLORIDE 0.9 % IV SOLN
Freq: Once | INTRAVENOUS | Status: AC
  Filled 2020-12-11: qty 250

## 2020-12-11 NOTE — Patient Instructions (Signed)
Carboplatin injection What is this medicine? CARBOPLATIN (KAR boe pla tin) is a chemotherapy drug. It targets fast dividing cells, like cancer cells, and causes these cells to die. This medicine is used to treat ovarian cancer and many other cancers. This medicine may be used for other purposes; ask your health care provider or pharmacist if you have questions. COMMON BRAND NAME(S): Paraplatin What should I tell my health care provider before I take this medicine? They need to know if you have any of these conditions:  blood disorders  hearing problems  kidney disease  recent or ongoing radiation therapy  an unusual or allergic reaction to carboplatin, cisplatin, other chemotherapy, other medicines, foods, dyes, or preservatives  pregnant or trying to get pregnant  breast-feeding How should I use this medicine? This drug is usually given as an infusion into a vein. It is administered in a hospital or clinic by a specially trained health care professional. Talk to your pediatrician regarding the use of this medicine in children. Special care may be needed. Overdosage: If you think you have taken too much of this medicine contact a poison control center or emergency room at once. NOTE: This medicine is only for you. Do not share this medicine with others. What if I miss a dose? It is important not to miss a dose. Call your doctor or health care professional if you are unable to keep an appointment. What may interact with this medicine?  medicines for seizures  medicines to increase blood counts like filgrastim, pegfilgrastim, sargramostim  some antibiotics like amikacin, gentamicin, neomycin, streptomycin, tobramycin  vaccines Talk to your doctor or health care professional before taking any of these medicines:  acetaminophen  aspirin  ibuprofen  ketoprofen  naproxen This list may not describe all possible interactions. Give your health care provider a list of all the  medicines, herbs, non-prescription drugs, or dietary supplements you use. Also tell them if you smoke, drink alcohol, or use illegal drugs. Some items may interact with your medicine. What should I watch for while using this medicine? Your condition will be monitored carefully while you are receiving this medicine. You will need important blood work done while you are taking this medicine. This drug may make you feel generally unwell. This is not uncommon, as chemotherapy can affect healthy cells as well as cancer cells. Report any side effects. Continue your course of treatment even though you feel ill unless your doctor tells you to stop. In some cases, you may be given additional medicines to help with side effects. Follow all directions for their use. Call your doctor or health care professional for advice if you get a fever, chills or sore throat, or other symptoms of a cold or flu. Do not treat yourself. This drug decreases your body's ability to fight infections. Try to avoid being around people who are sick. This medicine may increase your risk to bruise or bleed. Call your doctor or health care professional if you notice any unusual bleeding. Be careful brushing and flossing your teeth or using a toothpick because you may get an infection or bleed more easily. If you have any dental work done, tell your dentist you are receiving this medicine. Avoid taking products that contain aspirin, acetaminophen, ibuprofen, naproxen, or ketoprofen unless instructed by your doctor. These medicines may hide a fever. Do not become pregnant while taking this medicine. Women should inform their doctor if they wish to become pregnant or think they might be pregnant. There is a  potential for serious side effects to an unborn child. Talk to your health care professional or pharmacist for more information. Do not breast-feed an infant while taking this medicine. What side effects may I notice from receiving this  medicine? Side effects that you should report to your doctor or health care professional as soon as possible:  allergic reactions like skin rash, itching or hives, swelling of the face, lips, or tongue  signs of infection - fever or chills, cough, sore throat, pain or difficulty passing urine  signs of decreased platelets or bleeding - bruising, pinpoint red spots on the skin, black, tarry stools, nosebleeds  signs of decreased red blood cells - unusually weak or tired, fainting spells, lightheadedness  breathing problems  changes in hearing  changes in vision  chest pain  high blood pressure  low blood counts - This drug may decrease the number of white blood cells, red blood cells and platelets. You may be at increased risk for infections and bleeding.  nausea and vomiting  pain, swelling, redness or irritation at the injection site  pain, tingling, numbness in the hands or feet  problems with balance, talking, walking  trouble passing urine or change in the amount of urine Side effects that usually do not require medical attention (report to your doctor or health care professional if they continue or are bothersome):  hair loss  loss of appetite  metallic taste in the mouth or changes in taste This list may not describe all possible side effects. Call your doctor for medical advice about side effects. You may report side effects to FDA at 1-800-FDA-1088. Where should I keep my medicine? This drug is given in a hospital or clinic and will not be stored at home. NOTE: This sheet is a summary. It may not cover all possible information. If you have questions about this medicine, talk to your doctor, pharmacist, or health care provider.  2021 Elsevier/Gold Standard (2007-12-08 14:38:05) Pemetrexed injection What is this medicine? PEMETREXED (PEM e TREX ed) is a chemotherapy drug used to treat lung cancers like non-small cell lung cancer and mesothelioma. It may also be  used to treat other cancers. This medicine may be used for other purposes; ask your health care provider or pharmacist if you have questions. COMMON BRAND NAME(S): Alimta What should I tell my health care provider before I take this medicine? They need to know if you have any of these conditions:  infection (especially a virus infection such as chickenpox, cold sores, or herpes)  kidney disease  low blood counts, like low white cell, platelet, or red cell counts  lung or breathing disease, like asthma  radiation therapy  an unusual or allergic reaction to pemetrexed, other medicines, foods, dyes, or preservative  pregnant or trying to get pregnant  breast-feeding How should I use this medicine? This drug is given as an infusion into a vein. It is administered in a hospital or clinic by a specially trained health care professional. Talk to your pediatrician regarding the use of this medicine in children. Special care may be needed. Overdosage: If you think you have taken too much of this medicine contact a poison control center or emergency room at once. NOTE: This medicine is only for you. Do not share this medicine with others. What if I miss a dose? It is important not to miss your dose. Call your doctor or health care professional if you are unable to keep an appointment. What may interact with this medicine?  This medicine may interact with the following medications:  Ibuprofen This list may not describe all possible interactions. Give your health care provider a list of all the medicines, herbs, non-prescription drugs, or dietary supplements you use. Also tell them if you smoke, drink alcohol, or use illegal drugs. Some items may interact with your medicine. What should I watch for while using this medicine? Visit your doctor for checks on your progress. This drug may make you feel generally unwell. This is not uncommon, as chemotherapy can affect healthy cells as well as cancer  cells. Report any side effects. Continue your course of treatment even though you feel ill unless your doctor tells you to stop. In some cases, you may be given additional medicines to help with side effects. Follow all directions for their use. Call your doctor or health care professional for advice if you get a fever, chills or sore throat, or other symptoms of a cold or flu. Do not treat yourself. This drug decreases your body's ability to fight infections. Try to avoid being around people who are sick. This medicine may increase your risk to bruise or bleed. Call your doctor or health care professional if you notice any unusual bleeding. Be careful brushing and flossing your teeth or using a toothpick because you may get an infection or bleed more easily. If you have any dental work done, tell your dentist you are receiving this medicine. Avoid taking products that contain aspirin, acetaminophen, ibuprofen, naproxen, or ketoprofen unless instructed by your doctor. These medicines may hide a fever. Call your doctor or health care professional if you get diarrhea or mouth sores. Do not treat yourself. To protect your kidneys, drink water or other fluids as directed while you are taking this medicine. Do not become pregnant while taking this medicine or for 6 months after stopping it. Women should inform their doctor if they wish to become pregnant or think they might be pregnant. Men should not father a child while taking this medicine and for 3 months after stopping it. This may interfere with the ability to father a child. You should talk to your doctor or health care professional if you are concerned about your fertility. There is a potential for serious side effects to an unborn child. Talk to your health care professional or pharmacist for more information. Do not breast-feed an infant while taking this medicine or for 1 week after stopping it. What side effects may I notice from receiving this  medicine? Side effects that you should report to your doctor or health care professional as soon as possible:  allergic reactions like skin rash, itching or hives, swelling of the face, lips, or tongue  breathing problems  redness, blistering, peeling or loosening of the skin, including inside the mouth  signs and symptoms of bleeding such as bloody or black, tarry stools; red or dark-brown urine; spitting up blood or brown material that looks like coffee grounds; red spots on the skin; unusual bruising or bleeding from the eye, gums, or nose  signs and symptoms of infection like fever or chills; cough; sore throat; pain or trouble passing urine  signs and symptoms of kidney injury like trouble passing urine or change in the amount of urine  signs and symptoms of liver injury like dark yellow or brown urine; general ill feeling or flu-like symptoms; light-colored stools; loss of appetite; nausea; right upper belly pain; unusually weak or tired; yellowing of the eyes or skin Side effects that usually do  not require medical attention (report to your doctor or health care professional if they continue or are bothersome):  constipation  mouth sores  nausea, vomiting  unusually weak or tired This list may not describe all possible side effects. Call your doctor for medical advice about side effects. You may report side effects to FDA at 1-800-FDA-1088. Where should I keep my medicine? This drug is given in a hospital or clinic and will not be stored at home. NOTE: This sheet is a summary. It may not cover all possible information. If you have questions about this medicine, talk to your doctor, pharmacist, or health care provider.  2021 Elsevier/Gold Standard (2017-10-22 16:11:33)

## 2020-12-12 ENCOUNTER — Telehealth: Payer: Self-pay | Admitting: *Deleted

## 2020-12-12 ENCOUNTER — Other Ambulatory Visit: Payer: Self-pay | Admitting: Thoracic Surgery (Cardiothoracic Vascular Surgery)

## 2020-12-12 ENCOUNTER — Telehealth: Payer: Self-pay | Admitting: Internal Medicine

## 2020-12-12 MED ORDER — TRAMADOL HCL 50 MG PO TABS: 50.0000 mg | ORAL_TABLET | Freq: Two times a day (BID) | ORAL | 0 refills | Status: DC | PRN

## 2020-12-12 NOTE — Progress Notes (Signed)
Refilled tramadol 50 mg BID, 1 BID PRN, 30 tabs, no refills  [name] C. Roxan Hockey, MD [facility] [phone]

## 2020-12-12 NOTE — Telephone Encounter (Signed)
Mr. Inglett called for patient, [name], requesting a refill of tramadol. Upon return phone call I spoke with [name]. Per patient, she is constantly in "5 or worse" pain. The pain is said to be constant throughout the day. Pain is described as "tight, pulling, numbness & achy". Patient states she only tries to take one Tramadol a day but will occasionally have to take two. She states Tylenol helps ease the pain. Notified Dr. Roxan Hockey and a refill was sent to patient's pharmacy. Mr. Trochez made aware. No further questions at this time.

## 2020-12-12 NOTE — Telephone Encounter (Signed)
Scheduled per los. Called and spoke with patient. Confirmed appt 

## 2020-12-18 ENCOUNTER — Telehealth: Payer: Self-pay | Admitting: Medical Oncology

## 2020-12-18 ENCOUNTER — Inpatient Hospital Stay: Payer: Medicare Other | Attending: Physician Assistant

## 2020-12-18 ENCOUNTER — Other Ambulatory Visit: Payer: Self-pay

## 2020-12-18 DIAGNOSIS — Z5111 Encounter for antineoplastic chemotherapy: Secondary | ICD-10-CM | POA: Diagnosis present

## 2020-12-18 DIAGNOSIS — C771 Secondary and unspecified malignant neoplasm of intrathoracic lymph nodes: Secondary | ICD-10-CM | POA: Insufficient documentation

## 2020-12-18 DIAGNOSIS — C3411 Malignant neoplasm of upper lobe, right bronchus or lung: Secondary | ICD-10-CM | POA: Diagnosis present

## 2020-12-18 LAB — CBC WITH DIFFERENTIAL (CANCER CENTER ONLY)
Abs Immature Granulocytes: 0.01 10*3/uL (ref 0.00–0.07)
Basophils Absolute: 0 10*3/uL (ref 0.0–0.1)
Basophils Relative: 1 %
Eosinophils Absolute: 0.1 10*3/uL (ref 0.0–0.5)
Eosinophils Relative: 1 %
HCT: 36 % (ref 36.0–46.0)
Hemoglobin: 11.7 g/dL — ABNORMAL LOW (ref 12.0–15.0)
Immature Granulocytes: 0 %
Lymphocytes Relative: 35 %
Lymphs Abs: 1.5 10*3/uL (ref 0.7–4.0)
MCH: 31.1 pg (ref 26.0–34.0)
MCHC: 32.5 g/dL (ref 30.0–36.0)
MCV: 95.7 fL (ref 80.0–100.0)
Monocytes Absolute: 0.2 10*3/uL (ref 0.1–1.0)
Monocytes Relative: 4 %
Neutro Abs: 2.6 10*3/uL (ref 1.7–7.7)
Neutrophils Relative %: 59 %
Platelet Count: 163 10*3/uL (ref 150–400)
RBC: 3.76 MIL/uL — ABNORMAL LOW (ref 3.87–5.11)
RDW: 13.8 % (ref 11.5–15.5)
WBC Count: 4.3 10*3/uL (ref 4.0–10.5)
nRBC: 0 % (ref 0.0–0.2)

## 2020-12-18 LAB — CMP (CANCER CENTER ONLY)
ALT: 71 U/L — ABNORMAL HIGH (ref 0–44)
AST: 48 U/L — ABNORMAL HIGH (ref 15–41)
Albumin: 4.4 g/dL (ref 3.5–5.0)
Alkaline Phosphatase: 89 U/L (ref 38–126)
Anion gap: 12 (ref 5–15)
BUN: 10 mg/dL (ref 8–23)
CO2: 28 mmol/L (ref 22–32)
Calcium: 9.2 mg/dL (ref 8.9–10.3)
Chloride: 98 mmol/L (ref 98–111)
Creatinine: 0.83 mg/dL (ref 0.44–1.00)
GFR, Estimated: >60 mL/min (ref >60)
Glucose, Bld: 96 mg/dL (ref 70–99)
Potassium: 3.9 mmol/L (ref 3.5–5.1)
Sodium: 138 mmol/L (ref 135–145)
Total Bilirubin: 0.6 mg/dL (ref 0.3–1.2)
Total Protein: 7.2 g/dL (ref 6.5–8.1)

## 2020-12-18 NOTE — Telephone Encounter (Signed)
I prefer it to be done a week before or after the infusion.  Thank you.

## 2020-12-18 NOTE — Telephone Encounter (Signed)
Booster-Can pt get her 3rd Covid Vaccine  next week at her infusion appt?  Her first and second vaccines were Moderna  in Jan and feb 2021.   Can I schedule  her 3rd vaccine next week?

## 2020-12-21 ENCOUNTER — Telehealth: Payer: Self-pay

## 2020-12-21 NOTE — Telephone Encounter (Signed)
Patient's husband, Dezeray Puccio contacted the office concerned about his wife's pain. She stated over the phone that she was having problems with pain that was getting better she thought while taking Tramadol for pain which she was just prescribed.  She stated that the pain felt like a "tightness, numb, radiating" "sharp, stabbing" pain that radiated from her right breast and around her right side.  She stated that the Tramadol was not helping at all like it did in the beginning and was wondering if she had built up a tolerance of taking the pain medication.  Advised that this sounds like neuropathic pain instead of incisional pain and that the pain may never completely go away.  Advised that she could go to her local pharmacy and get over -the-counter Voltaren Gel and place on the area that is giving her the most pain, but advised to not place the medication on any surgical incisions that were not completely healed.  Eduard Clos acknowledged receipt.  Also, was able to move patient's appointment up one week to discuss symptoms with Dr. Roxan Hockey at her follow-up appointment.  Patient is to come into the office 12/26/20. Patient  And husband aware.

## 2020-12-25 ENCOUNTER — Inpatient Hospital Stay: Payer: Medicare Other

## 2020-12-25 ENCOUNTER — Other Ambulatory Visit: Payer: Self-pay | Admitting: Thoracic Surgery (Cardiothoracic Vascular Surgery)

## 2020-12-25 ENCOUNTER — Other Ambulatory Visit: Payer: Self-pay

## 2020-12-25 DIAGNOSIS — C3411 Malignant neoplasm of upper lobe, right bronchus or lung: Secondary | ICD-10-CM

## 2020-12-25 DIAGNOSIS — Z5111 Encounter for antineoplastic chemotherapy: Secondary | ICD-10-CM | POA: Diagnosis not present

## 2020-12-25 LAB — CBC WITH DIFFERENTIAL (CANCER CENTER ONLY)
Abs Immature Granulocytes: 0.01 10*3/uL (ref 0.00–0.07)
Basophils Absolute: 0 10*3/uL (ref 0.0–0.1)
Basophils Relative: 0 %
Eosinophils Absolute: 0 10*3/uL (ref 0.0–0.5)
Eosinophils Relative: 1 %
HCT: 33.6 % — ABNORMAL LOW (ref 36.0–46.0)
Hemoglobin: 11.1 g/dL — ABNORMAL LOW (ref 12.0–15.0)
Immature Granulocytes: 0 %
Lymphocytes Relative: 31 %
Lymphs Abs: 1.5 10*3/uL (ref 0.7–4.0)
MCH: 31.5 pg (ref 26.0–34.0)
MCHC: 33 g/dL (ref 30.0–36.0)
MCV: 95.5 fL (ref 80.0–100.0)
Monocytes Absolute: 0.4 10*3/uL (ref 0.1–1.0)
Monocytes Relative: 9 %
Neutro Abs: 2.7 10*3/uL (ref 1.7–7.7)
Neutrophils Relative %: 59 %
Platelet Count: 132 10*3/uL — ABNORMAL LOW (ref 150–400)
RBC: 3.52 MIL/uL — ABNORMAL LOW (ref 3.87–5.11)
RDW: 13.7 % (ref 11.5–15.5)
WBC Count: 4.6 10*3/uL (ref 4.0–10.5)
nRBC: 0 % (ref 0.0–0.2)

## 2020-12-25 LAB — CMP (CANCER CENTER ONLY)
ALT: 55 U/L — ABNORMAL HIGH (ref 0–44)
AST: 43 U/L — ABNORMAL HIGH (ref 15–41)
Albumin: 4.6 g/dL (ref 3.5–5.0)
Alkaline Phosphatase: 96 U/L (ref 38–126)
Anion gap: 13 (ref 5–15)
BUN: 9 mg/dL (ref 8–23)
CO2: 26 mmol/L (ref 22–32)
Calcium: 9.3 mg/dL (ref 8.9–10.3)
Chloride: 99 mmol/L (ref 98–111)
Creatinine: 0.81 mg/dL (ref 0.44–1.00)
GFR, Estimated: >60 mL/min (ref >60)
Glucose, Bld: 89 mg/dL (ref 70–99)
Potassium: 4.3 mmol/L (ref 3.5–5.1)
Sodium: 138 mmol/L (ref 135–145)
Total Bilirubin: 0.6 mg/dL (ref 0.3–1.2)
Total Protein: 7.5 g/dL (ref 6.5–8.1)

## 2020-12-26 ENCOUNTER — Ambulatory Visit (INDEPENDENT_AMBULATORY_CARE_PROVIDER_SITE_OTHER): Payer: Self-pay | Admitting: Thoracic Surgery (Cardiothoracic Vascular Surgery)

## 2020-12-26 ENCOUNTER — Ambulatory Visit
Admission: RE | Admit: 2020-12-26 | Discharge: 2020-12-26 | Disposition: A | Discharge status: Home / Self Care | Payer: Medicare Other | Source: Ambulatory Visit | Attending: Thoracic Surgery (Cardiothoracic Vascular Surgery) | Admitting: Thoracic Surgery (Cardiothoracic Vascular Surgery)

## 2020-12-26 VITALS — BP 124/77 | HR 69 | Resp 20 | Ht 63.0 in | Wt 128.0 lb

## 2020-12-26 DIAGNOSIS — Z902 Acquired absence of lung [part of]: Secondary | ICD-10-CM

## 2020-12-26 DIAGNOSIS — C3411 Malignant neoplasm of upper lobe, right bronchus or lung: Secondary | ICD-10-CM

## 2020-12-26 IMAGING — CR DG CHEST 2V
2 series · 2 of 2 positions shown · non-contrast
Comparison: [DATE] [DATE]

CLINICAL DATA: Malignant neoplasm upper lobe of right lung.
Shortness of breath and chest pain.

EXAM:
CHEST - 2 VIEW

[w chest pa]
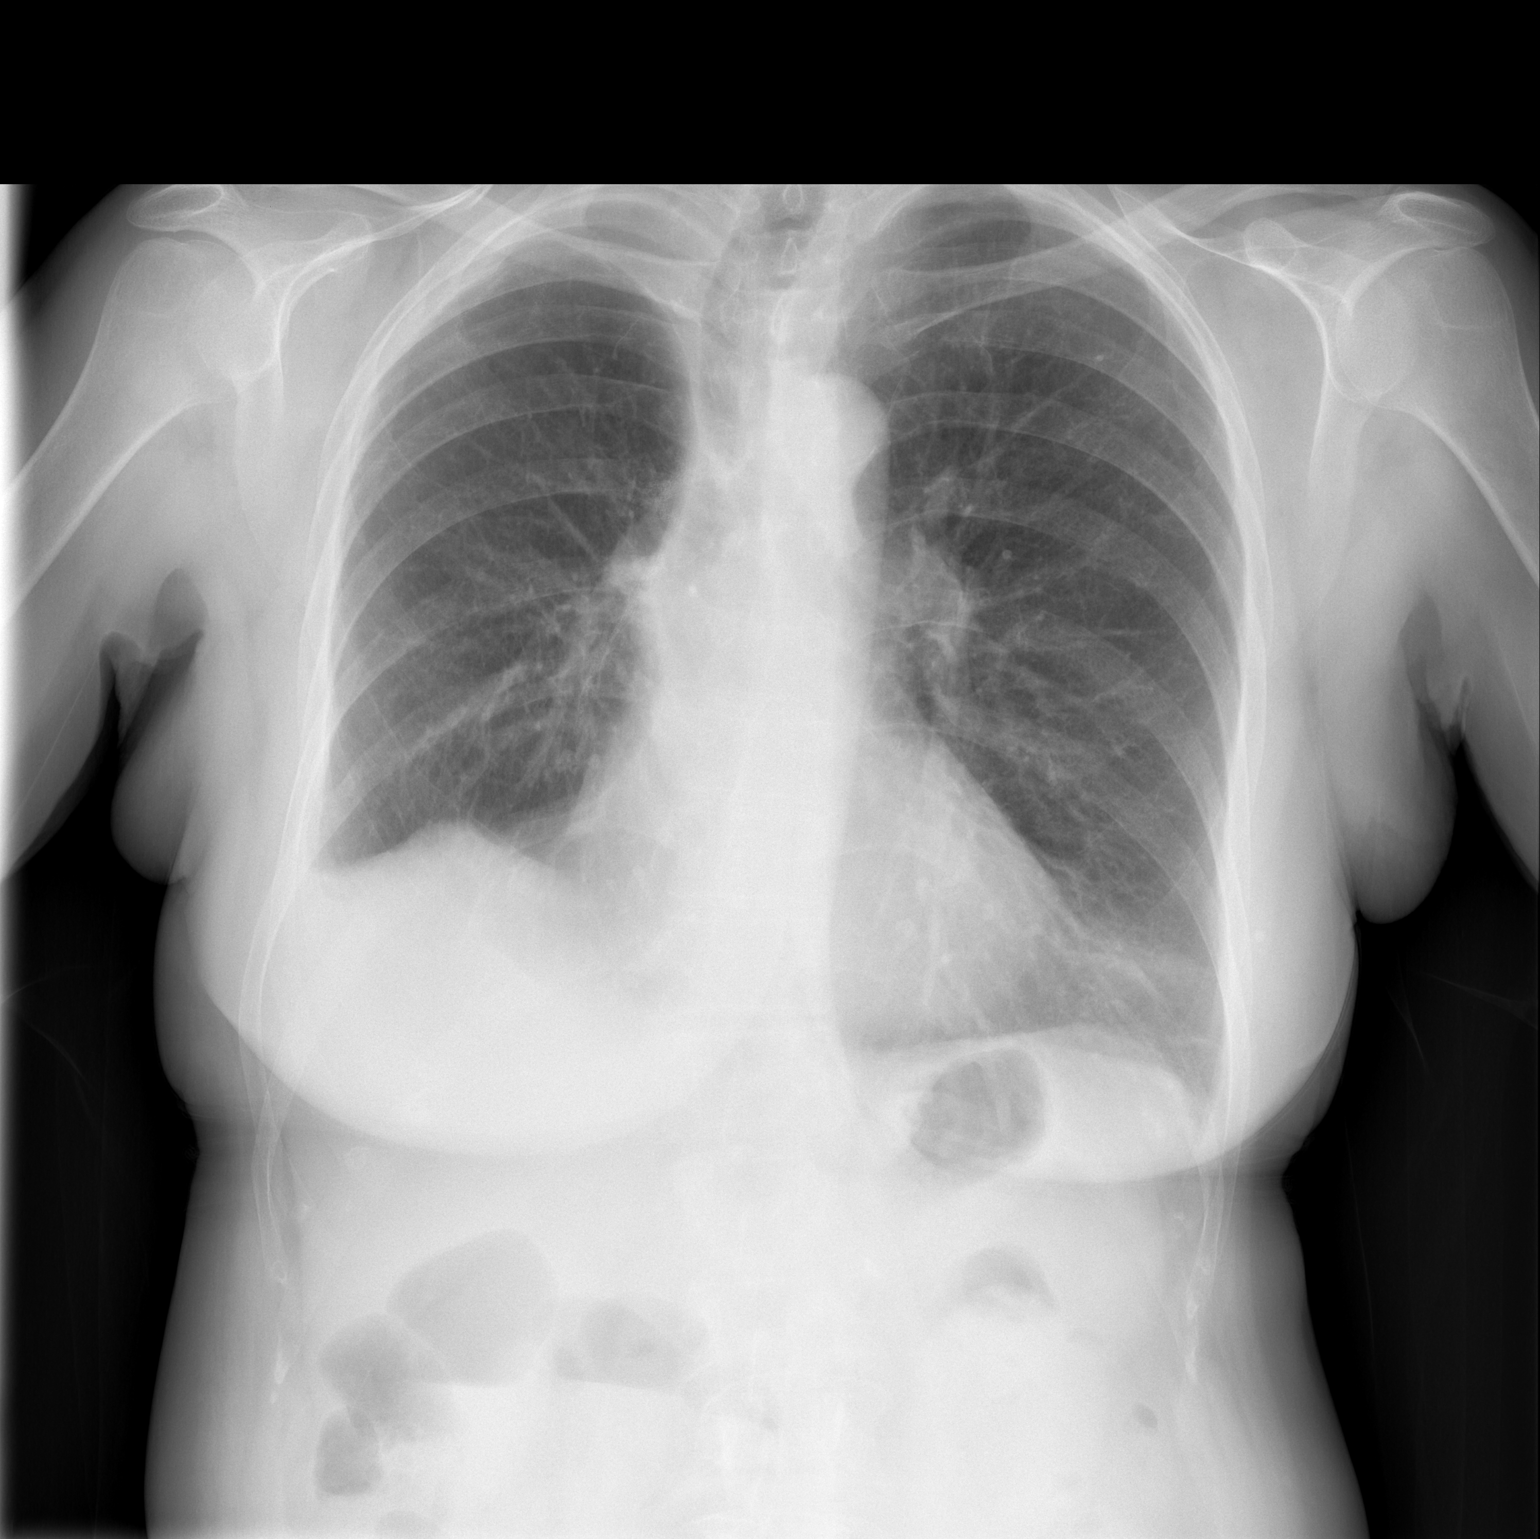

[w chest lat]
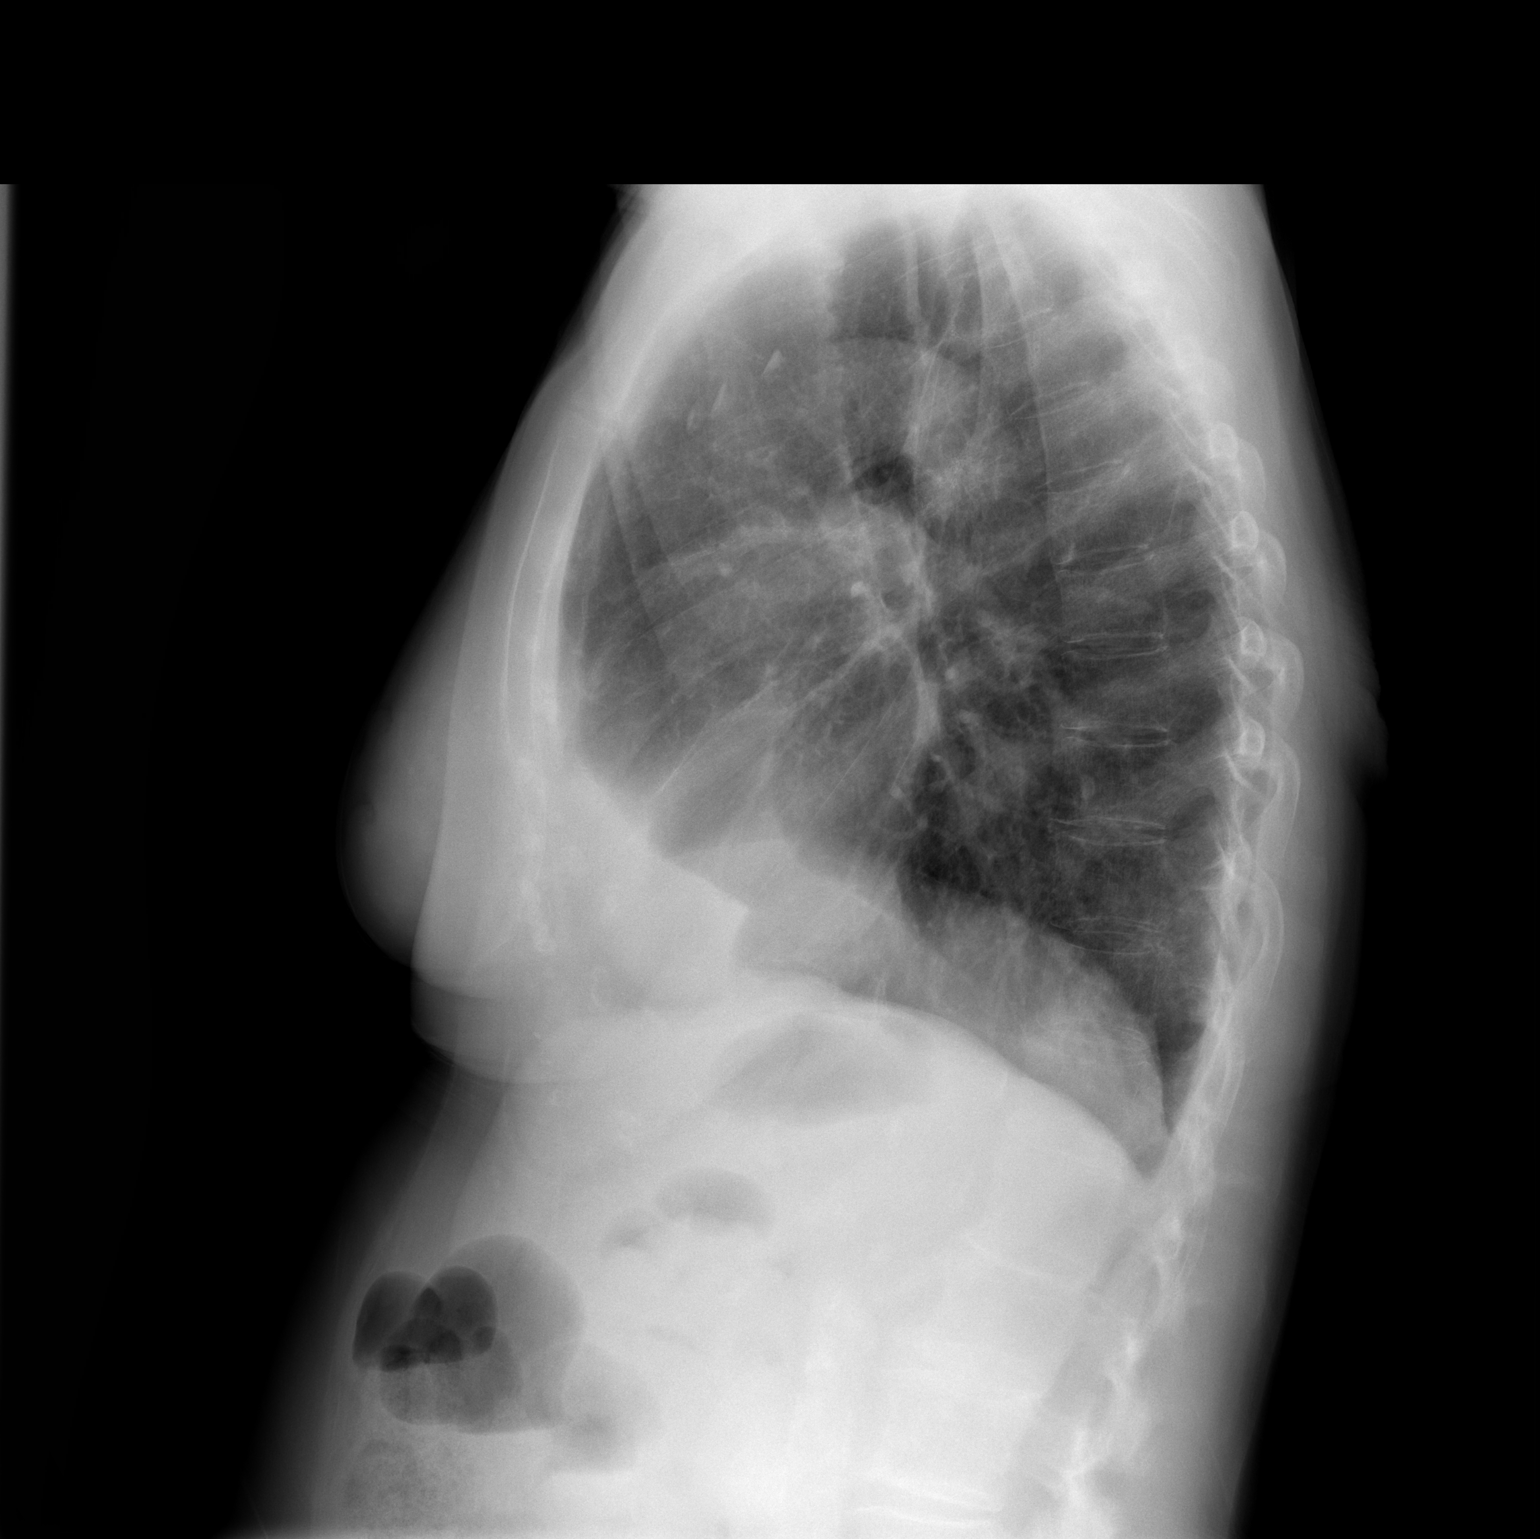

[2 of 2 positions shown; findings below may reference images not displayed]

FINDINGS: Again noted is volume loss in the right hemithorax with elevation of
the right hemidiaphragm. Findings compatible with postoperative
change. This subcutaneous emphysema has resolved. Again noted is a
pleural line along the medial right upper chest and not clear if the
small pneumothorax has completely resolved. Few densities in the
left lower chest are similar to the previous examination. No large
pleural effusions. Heart and mediastinum are stable.
IMPRESSION: Postoperative changes in the right hemithorax. Difficult to exclude
a small residual right apical pneumothorax.

Chronic subtle densities at the left lung base.

## 2020-12-26 MED ORDER — PREGABALIN 25 MG PO CAPS: ORAL_CAPSULE | ORAL | 1 refills | Status: DC

## 2020-12-26 NOTE — Progress Notes (Signed)
[address]       ,[place] [address]             [phone]       HPI: Mrs. Dobkins returns for a postoperative follow-up visit  [name] is a 70 year old woman with a history of tobacco abuse and COPD.  She had a 50-pack-year history of smoking before quitting in January 2021.  She was found to have a right upper lobe lung nodule on a low-dose screening CT.  On PET CT the nodule was hypermetabolic and there was also a hypermetabolic right paratracheal node.  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  She was treated with neoadjuvant chemotherapy and immunotherapy and then underwent a robotic right upper lobectomy and node dissection.  Surgery was on 10/19/2020.  She went home with a chest tube in place.  I removed that in the office on 11/01/2020.  She been having a lot of issues with pain control.  We refilled her tramadol prescription a couple of times.  She now complains of a sharp burning and tingling pain along the right costal margin.  Tramadol does not seem to be as effective for that.  She has tried Voltaren gel which has helped slightly. Past Medical History:  Diagnosis Date  . Cataract   . COPD (chronic obstructive pulmonary disease) ([place])       Current Outpatient Medications  Medication Sig Dispense Refill  . albuterol (PROVENTIL HFA;VENTOLIN HFA) 108 (90 Base) MCG/ACT inhaler Inhale 2 puffs into the lungs every 4 (four) hours as needed for wheezing or shortness of breath (cough, shortness of breath or wheezing.). 1 Inhaler 1  . Calcium Carbonate (CALCIUM 600 PO) Take 600 mg by mouth 2 (two) times daily.     . Cholecalciferol (VITAMIN D3 PO) Take 2,000 Units by mouth daily.    Marland Kitchen dexamethasone (DECADRON) 4 MG tablet 1 tablet p.o. twice daily the day before, day of and day after chemotherapy every 3 weeks. 40 tablet 0  . fluticasone (FLONASE) 50 MCG/ACT nasal spray Place 2 sprays into Frederick nostrils daily. 16 g 12  .  Fluticasone-Salmeterol (ADVAIR) 250-50 MCG/DOSE AEPB Inhale 1 puff into the lungs 2 (two) times daily.     . folic acid (FOLVITE) 1 MG tablet Take 1 tablet (1 mg total) by mouth daily. 30 tablet 4  . guaiFENesin (MUCINEX) 600 MG 12 hr tablet Take 1 tablet (600 mg total) by mouth 2 (two) times daily as needed for cough or to loosen phlegm.    . Multiple Vitamin (MULTIVITAMIN) tablet Take 1 tablet by mouth daily.    . prochlorperazine (COMPAZINE) 10 MG tablet Take 1 tablet (10 mg total) by mouth every 6 (six) hours as needed. 30 tablet 2  . traMADol (ULTRAM) 50 MG tablet Take 1 tablet (50 mg total) by mouth 2 (two) times daily as needed for up to 28 days for severe pain. 30 tablet 0  . traZODone (DESYREL) 50 MG tablet Take 25-50 mg by mouth at bedtime.     No current facility-administered medications for this visit.    Physical Exam BP 124/77 (BP Location: Left Arm, Patient Position: Sitting)   Pulse 69   Resp 20   Ht 5\' 3"  (1.6 m)   Wt 128 lb (58.1 kg)   SpO2 92% Comment: RA  BMI 22.81 kg/m  70 year old woman in no acute distress Alert and oriented x3 with no focal deficits Lungs diminished at  right base but otherwise clear Incisions well-healed Cardiac regular rate and rhythm Incisions well-healed, but tenderness to palpation  Diagnostic Tests: I personally reviewed her chest x-ray.  Shows postoperative changes.  Overall good postoperative appearance.  Impression: [name] is a 70 year old woman with a history of tobacco abuse and COPD who was found to have a lung nodule on a low-dose screening CT.  PET CT was consistent with a T1, N2, stage IIIa disease.  That was documented with navigational bronchoscopy and endobronchial ultrasound.  She was treated with neoadjuvant carboplatin, Alimta, and immunotherapy with Keytruda.  She had a partial radiographic response.  I did a robotic right upper lobectomy and node dissection on 10/19/2020.  She did still have residual tumor Frederick in the  paratracheal node and the original site.  All the other nodes were negative.  Her pathologic stage was ypT1, ypN2.  She had an air leak initially went home with a chest tube after about 9 days.  Ultimately removed that in the office on postoperative day #13.  She has not had any respiratory issues since the tube was removed.  She is having neuropathic pain.  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  We discussed treatment with gabapentin or Lyrica.  Lower doses of gabapentin are not covered by her insurance so we will start with Lyrica 25mg  daily for 3 days and then twice daily for another 3 days and then 3 times a day if she is tolerating it without side effects.  She may continue to use tramadol and Voltaren gel as needed.  Plan: Lyrica Return in 1 month to check on progress  [name], MD [facility] [phone]

## 2021-01-01 ENCOUNTER — Telehealth: Payer: Self-pay | Admitting: Medical Oncology

## 2021-01-01 ENCOUNTER — Other Ambulatory Visit: Payer: Self-pay

## 2021-01-01 ENCOUNTER — Inpatient Hospital Stay: Payer: Medicare Other | Admitting: Internal Medicine

## 2021-01-01 ENCOUNTER — Encounter: Payer: Self-pay | Admitting: Internal Medicine

## 2021-01-01 ENCOUNTER — Inpatient Hospital Stay: Payer: Medicare Other

## 2021-01-01 VITALS — BP 125/84 | HR 69 | Temp 97.9°F | Resp 18 | Ht 63.0 in | Wt 114.3 lb

## 2021-01-01 DIAGNOSIS — C3411 Malignant neoplasm of upper lobe, right bronchus or lung: Secondary | ICD-10-CM

## 2021-01-01 DIAGNOSIS — Z5111 Encounter for antineoplastic chemotherapy: Secondary | ICD-10-CM

## 2021-01-01 LAB — CBC WITH DIFFERENTIAL (CANCER CENTER ONLY)
Abs Immature Granulocytes: 0.1 10*3/uL — ABNORMAL HIGH (ref 0.00–0.07)
Basophils Absolute: 0 10*3/uL (ref 0.0–0.1)
Basophils Relative: 0 %
Eosinophils Absolute: 0 10*3/uL (ref 0.0–0.5)
Eosinophils Relative: 0 %
HCT: 32 % — ABNORMAL LOW (ref 36.0–46.0)
Hemoglobin: 10.6 g/dL — ABNORMAL LOW (ref 12.0–15.0)
Immature Granulocytes: 2 %
Lymphocytes Relative: 20 %
Lymphs Abs: 1.2 10*3/uL (ref 0.7–4.0)
MCH: 32 pg (ref 26.0–34.0)
MCHC: 33.1 g/dL (ref 30.0–36.0)
MCV: 96.7 fL (ref 80.0–100.0)
Monocytes Absolute: 0.5 10*3/uL (ref 0.1–1.0)
Monocytes Relative: 8 %
Neutro Abs: 4.3 10*3/uL (ref 1.7–7.7)
Neutrophils Relative %: 70 %
Platelet Count: 265 10*3/uL (ref 150–400)
RBC: 3.31 MIL/uL — ABNORMAL LOW (ref 3.87–5.11)
RDW: 14.6 % (ref 11.5–15.5)
WBC Count: 6.1 10*3/uL (ref 4.0–10.5)
nRBC: 0 % (ref 0.0–0.2)

## 2021-01-01 LAB — CMP (CANCER CENTER ONLY)
ALT: 60 U/L — ABNORMAL HIGH (ref 0–44)
AST: 61 U/L — ABNORMAL HIGH (ref 15–41)
Albumin: 4.8 g/dL (ref 3.5–5.0)
Alkaline Phosphatase: 83 U/L (ref 38–126)
Anion gap: 15 (ref 5–15)
BUN: 9 mg/dL (ref 8–23)
CO2: 26 mmol/L (ref 22–32)
Calcium: 9.6 mg/dL (ref 8.9–10.3)
Chloride: 99 mmol/L (ref 98–111)
Creatinine: 0.84 mg/dL (ref 0.44–1.00)
GFR, Estimated: >60 mL/min (ref >60)
Glucose, Bld: 94 mg/dL (ref 70–99)
Potassium: 3.9 mmol/L (ref 3.5–5.1)
Sodium: 140 mmol/L (ref 135–145)
Total Bilirubin: 0.4 mg/dL (ref 0.3–1.2)
Total Protein: 7.5 g/dL (ref 6.5–8.1)

## 2021-01-01 MED ORDER — HEPARIN SOD (PORK) LOCK FLUSH 100 UNIT/ML IV SOLN
500.0000 [IU] | Freq: Once | INTRAVENOUS | Status: DC | PRN
  Filled 2021-01-01: qty 5

## 2021-01-01 MED ORDER — SODIUM CHLORIDE 0.9 % IV SOLN
Freq: Once | INTRAVENOUS | Status: AC
Start: 2021-01-01 — End: 2021-01-01
  Filled 2021-01-01: qty 250

## 2021-01-01 MED ORDER — SODIUM CHLORIDE 0.9 % IV SOLN
500.0000 mg/m2 | Freq: Once | INTRAVENOUS | Status: AC
  Administered 2021-01-01: 800 mg via INTRAVENOUS
  Filled 2021-01-01: qty 12

## 2021-01-01 MED ORDER — SODIUM CHLORIDE 0.9% FLUSH
10.0000 mL | INTRAVENOUS | Status: DC | PRN
  Filled 2021-01-01: qty 10

## 2021-01-01 MED ORDER — PALONOSETRON HCL INJECTION 0.25 MG/5ML
INTRAVENOUS | Status: AC
  Filled 2021-01-01: qty 5

## 2021-01-01 MED ORDER — DEXAMETHASONE SODIUM PHOSPHATE 100 MG/10ML IJ SOLN
10.0000 mg | Freq: Once | INTRAMUSCULAR | Status: AC
  Administered 2021-01-01: 10 mg via INTRAVENOUS
  Filled 2021-01-01: qty 10

## 2021-01-01 MED ORDER — SODIUM CHLORIDE 0.9 % IV SOLN
150.0000 mg | Freq: Once | INTRAVENOUS | Status: AC
  Administered 2021-01-01: 150 mg via INTRAVENOUS
  Filled 2021-01-01: qty 150

## 2021-01-01 MED ORDER — SODIUM CHLORIDE 0.9 % IV SOLN
370.0000 mg | Freq: Once | INTRAVENOUS | Status: AC
  Administered 2021-01-01: 370 mg via INTRAVENOUS
  Filled 2021-01-01: qty 37

## 2021-01-01 MED ORDER — PALONOSETRON HCL INJECTION 0.25 MG/5ML
0.2500 mg | Freq: Once | INTRAVENOUS | Status: AC
  Administered 2021-01-01: 0.25 mg via INTRAVENOUS

## 2021-01-01 NOTE — Patient Instructions (Signed)
Carboplatin injection What is this medicine? CARBOPLATIN (KAR boe pla tin) is a chemotherapy drug. It targets fast dividing cells, like cancer cells, and causes these cells to die. This medicine is used to treat ovarian cancer and many other cancers. This medicine may be used for other purposes; ask your health care provider or pharmacist if you have questions. COMMON BRAND NAME(S): Paraplatin What should I tell my health care provider before I take this medicine? They need to know if you have any of these conditions:  blood disorders  hearing problems  kidney disease  recent or ongoing radiation therapy  an unusual or allergic reaction to carboplatin, cisplatin, other chemotherapy, other medicines, foods, dyes, or preservatives  pregnant or trying to get pregnant  breast-feeding How should I use this medicine? This drug is usually given as an infusion into a vein. It is administered in a hospital or clinic by a specially trained health care professional. Talk to your pediatrician regarding the use of this medicine in children. Special care may be needed. Overdosage: If you think you have taken too much of this medicine contact a poison control center or emergency room at once. NOTE: This medicine is only for you. Do not share this medicine with others. What if I miss a dose? It is important not to miss a dose. Call your doctor or health care professional if you are unable to keep an appointment. What may interact with this medicine?  medicines for seizures  medicines to increase blood counts like filgrastim, pegfilgrastim, sargramostim  some antibiotics like amikacin, gentamicin, neomycin, streptomycin, tobramycin  vaccines Talk to your doctor or health care professional before taking any of these medicines:  acetaminophen  aspirin  ibuprofen  ketoprofen  naproxen This list may not describe all possible interactions. Give your health care provider a list of all the  medicines, herbs, non-prescription drugs, or dietary supplements you use. Also tell them if you smoke, drink alcohol, or use illegal drugs. Some items may interact with your medicine. What should I watch for while using this medicine? Your condition will be monitored carefully while you are receiving this medicine. You will need important blood work done while you are taking this medicine. This drug may make you feel generally unwell. This is not uncommon, as chemotherapy can affect healthy cells as well as cancer cells. Report any side effects. Continue your course of treatment even though you feel ill unless your doctor tells you to stop. In some cases, you may be given additional medicines to help with side effects. Follow all directions for their use. Call your doctor or health care professional for advice if you get a fever, chills or sore throat, or other symptoms of a cold or flu. Do not treat yourself. This drug decreases your body's ability to fight infections. Try to avoid being around people who are sick. This medicine may increase your risk to bruise or bleed. Call your doctor or health care professional if you notice any unusual bleeding. Be careful brushing and flossing your teeth or using a toothpick because you may get an infection or bleed more easily. If you have any dental work done, tell your dentist you are receiving this medicine. Avoid taking products that contain aspirin, acetaminophen, ibuprofen, naproxen, or ketoprofen unless instructed by your doctor. These medicines may hide a fever. Do not become pregnant while taking this medicine. Women should inform their doctor if they wish to become pregnant or think they might be pregnant. There is a  potential for serious side effects to an unborn child. Talk to your health care professional or pharmacist for more information. Do not breast-feed an infant while taking this medicine. What side effects may I notice from receiving this  medicine? Side effects that you should report to your doctor or health care professional as soon as possible:  allergic reactions like skin rash, itching or hives, swelling of the face, lips, or tongue  signs of infection - fever or chills, cough, sore throat, pain or difficulty passing urine  signs of decreased platelets or bleeding - bruising, pinpoint red spots on the skin, black, tarry stools, nosebleeds  signs of decreased red blood cells - unusually weak or tired, fainting spells, lightheadedness  breathing problems  changes in hearing  changes in vision  chest pain  high blood pressure  low blood counts - This drug may decrease the number of white blood cells, red blood cells and platelets. You may be at increased risk for infections and bleeding.  nausea and vomiting  pain, swelling, redness or irritation at the injection site  pain, tingling, numbness in the hands or feet  problems with balance, talking, walking  trouble passing urine or change in the amount of urine Side effects that usually do not require medical attention (report to your doctor or health care professional if they continue or are bothersome):  hair loss  loss of appetite  metallic taste in the mouth or changes in taste This list may not describe all possible side effects. Call your doctor for medical advice about side effects. You may report side effects to FDA at 1-800-FDA-1088. Where should I keep my medicine? This drug is given in a hospital or clinic and will not be stored at home. NOTE: This sheet is a summary. It may not cover all possible information. If you have questions about this medicine, talk to your doctor, pharmacist, or health care provider.  2021 [employer] Standard (2007-12-08 14:38:05) Pemetrexed injection What is this medicine? PEMETREXED (PEM e TREX ed) is a chemotherapy drug used to treat lung cancers like non-small cell lung cancer and mesothelioma. It may also be  used to treat other cancers. This medicine may be used for other purposes; ask your health care provider or pharmacist if you have questions. COMMON BRAND NAME(S): Alimta What should I tell my health care provider before I take this medicine? They need to know if you have any of these conditions:  infection (especially a virus infection such as chickenpox, cold sores, or herpes)  kidney disease  low blood counts, like low white cell, platelet, or red cell counts  lung or breathing disease, like asthma  radiation therapy  an unusual or allergic reaction to pemetrexed, other medicines, foods, dyes, or preservative  pregnant or trying to get pregnant  breast-feeding How should I use this medicine? This drug is given as an infusion into a vein. It is administered in a hospital or clinic by a specially trained health care professional. Talk to your pediatrician regarding the use of this medicine in children. Special care may be needed. Overdosage: If you think you have taken too much of this medicine contact a poison control center or emergency room at once. NOTE: This medicine is only for you. Do not share this medicine with others. What if I miss a dose? It is important not to miss your dose. Call your doctor or health care professional if you are unable to keep an appointment. What may interact with this medicine?  This medicine may interact with the following medications:  Ibuprofen This list may not describe all possible interactions. Give your health care provider a list of all the medicines, herbs, non-prescription drugs, or dietary supplements you use. Also tell them if you smoke, drink alcohol, or use illegal drugs. Some items may interact with your medicine. What should I watch for while using this medicine? Visit your doctor for checks on your progress. This drug may make you feel generally unwell. This is not uncommon, as chemotherapy can affect healthy cells as well as cancer  cells. Report any side effects. Continue your course of treatment even though you feel ill unless your doctor tells you to stop. In some cases, you may be given additional medicines to help with side effects. Follow all directions for their use. Call your doctor or health care professional for advice if you get a fever, chills or sore throat, or other symptoms of a cold or flu. Do not treat yourself. This drug decreases your body's ability to fight infections. Try to avoid being around people who are sick. This medicine may increase your risk to bruise or bleed. Call your doctor or health care professional if you notice any unusual bleeding. Be careful brushing and flossing your teeth or using a toothpick because you may get an infection or bleed more easily. If you have any dental work done, tell your dentist you are receiving this medicine. Avoid taking products that contain aspirin, acetaminophen, ibuprofen, naproxen, or ketoprofen unless instructed by your doctor. These medicines may hide a fever. Call your doctor or health care professional if you get diarrhea or mouth sores. Do not treat yourself. To protect your kidneys, drink water or other fluids as directed while you are taking this medicine. Do not become pregnant while taking this medicine or for 6 months after stopping it. Women should inform their doctor if they wish to become pregnant or think they might be pregnant. Men should not father a child while taking this medicine and for 3 months after stopping it. This may interfere with the ability to father a child. You should talk to your doctor or health care professional if you are concerned about your fertility. There is a potential for serious side effects to an unborn child. Talk to your health care professional or pharmacist for more information. Do not breast-feed an infant while taking this medicine or for 1 week after stopping it. What side effects may I notice from receiving this  medicine? Side effects that you should report to your doctor or health care professional as soon as possible:  allergic reactions like skin rash, itching or hives, swelling of the face, lips, or tongue  breathing problems  redness, blistering, peeling or loosening of the skin, including inside the mouth  signs and symptoms of bleeding such as bloody or black, tarry stools; red or dark-brown urine; spitting up blood or brown material that looks like coffee grounds; red spots on the skin; unusual bruising or bleeding from the eye, gums, or nose  signs and symptoms of infection like fever or chills; cough; sore throat; pain or trouble passing urine  signs and symptoms of kidney injury like trouble passing urine or change in the amount of urine  signs and symptoms of liver injury like dark yellow or brown urine; general ill feeling or flu-like symptoms; light-colored stools; loss of appetite; nausea; right upper belly pain; unusually weak or tired; yellowing of the eyes or skin Side effects that usually do  not require medical attention (report to your doctor or health care professional if they continue or are bothersome):  constipation  mouth sores  nausea, vomiting  unusually weak or tired This list may not describe all possible side effects. Call your doctor for medical advice about side effects. You may report side effects to FDA at 1-800-FDA-1088. Where should I keep my medicine? This drug is given in a hospital or clinic and will not be stored at home. NOTE: This sheet is a summary. It may not cover all possible information. If you have questions about this medicine, talk to your doctor, pharmacist, or health care provider.  2021 [employer] Standard (2017-10-22 16:11:33) Pemetrexed injection What is this medicine? PEMETREXED (PEM e TREX ed) is a chemotherapy drug used to treat lung cancers like non-small cell lung cancer and mesothelioma. It may also be used to treat other  cancers. This medicine may be used for other purposes; ask your health care provider or pharmacist if you have questions. COMMON BRAND NAME(S): Alimta What should I tell my health care provider before I take this medicine? They need to know if you have any of these conditions:  infection (especially a virus infection such as chickenpox, cold sores, or herpes)  kidney disease  low blood counts, like low white cell, platelet, or red cell counts  lung or breathing disease, like asthma  radiation therapy  an unusual or allergic reaction to pemetrexed, other medicines, foods, dyes, or preservative  pregnant or trying to get pregnant  breast-feeding How should I use this medicine? This drug is given as an infusion into a vein. It is administered in a hospital or clinic by a specially trained health care professional. Talk to your pediatrician regarding the use of this medicine in children. Special care may be needed. Overdosage: If you think you have taken too much of this medicine contact a poison control center or emergency room at once. NOTE: This medicine is only for you. Do not share this medicine with others. What if I miss a dose? It is important not to miss your dose. Call your doctor or health care professional if you are unable to keep an appointment. What may interact with this medicine? This medicine may interact with the following medications:  Ibuprofen This list may not describe all possible interactions. Give your health care provider a list of all the medicines, herbs, non-prescription drugs, or dietary supplements you use. Also tell them if you smoke, drink alcohol, or use illegal drugs. Some items may interact with your medicine. What should I watch for while using this medicine? Visit your doctor for checks on your progress. This drug may make you feel generally unwell. This is not uncommon, as chemotherapy can affect healthy cells as well as cancer cells. Report any  side effects. Continue your course of treatment even though you feel ill unless your doctor tells you to stop. In some cases, you may be given additional medicines to help with side effects. Follow all directions for their use. Call your doctor or health care professional for advice if you get a fever, chills or sore throat, or other symptoms of a cold or flu. Do not treat yourself. This drug decreases your body's ability to fight infections. Try to avoid being around people who are sick. This medicine may increase your risk to bruise or bleed. Call your doctor or health care professional if you notice any unusual bleeding. Be careful brushing and flossing your teeth or using a toothpick  because you may get an infection or bleed more easily. If you have any dental work done, tell your dentist you are receiving this medicine. Avoid taking products that contain aspirin, acetaminophen, ibuprofen, naproxen, or ketoprofen unless instructed by your doctor. These medicines may hide a fever. Call your doctor or health care professional if you get diarrhea or mouth sores. Do not treat yourself. To protect your kidneys, drink water or other fluids as directed while you are taking this medicine. Do not become pregnant while taking this medicine or for 6 months after stopping it. Women should inform their doctor if they wish to become pregnant or think they might be pregnant. Men should not father a child while taking this medicine and for 3 months after stopping it. This may interfere with the ability to father a child. You should talk to your doctor or health care professional if you are concerned about your fertility. There is a potential for serious side effects to an unborn child. Talk to your health care professional or pharmacist for more information. Do not breast-feed an infant while taking this medicine or for 1 week after stopping it. What side effects may I notice from receiving this medicine? Side effects  that you should report to your doctor or health care professional as soon as possible:  allergic reactions like skin rash, itching or hives, swelling of the face, lips, or tongue  breathing problems  redness, blistering, peeling or loosening of the skin, including inside the mouth  signs and symptoms of bleeding such as bloody or black, tarry stools; red or dark-brown urine; spitting up blood or brown material that looks like coffee grounds; red spots on the skin; unusual bruising or bleeding from the eye, gums, or nose  signs and symptoms of infection like fever or chills; cough; sore throat; pain or trouble passing urine  signs and symptoms of kidney injury like trouble passing urine or change in the amount of urine  signs and symptoms of liver injury like dark yellow or brown urine; general ill feeling or flu-like symptoms; light-colored stools; loss of appetite; nausea; right upper belly pain; unusually weak or tired; yellowing of the eyes or skin Side effects that usually do not require medical attention (report to your doctor or health care professional if they continue or are bothersome):  constipation  mouth sores  nausea, vomiting  unusually weak or tired This list may not describe all possible side effects. Call your doctor for medical advice about side effects. You may report side effects to FDA at 1-800-FDA-1088. Where should I keep my medicine? This drug is given in a hospital or clinic and will not be stored at home. NOTE: This sheet is a summary. It may not cover all possible information. If you have questions about this medicine, talk to your doctor, pharmacist, or health care provider.  2021 [employer] Standard (2017-10-22 16:11:33)

## 2021-01-01 NOTE — Progress Notes (Signed)
[facility] [phone]   [phone]  OFFICE PROGRESS NOTE  [name], [address]  [place] [address]  DIAGNOSIS: Stage IIIA (T1b, N2, M0) non-small cell lung cancer, adenocarcinoma presented with right upper lobe lung nodule as well as right upper paratracheal adenopathy and suspicious tiny nodule in the right middle lobe.  PRIOR THERAPY:  1) Neoadjuvant systemic chemotherapy with carboplatin for AUC of 5, Alimta 500 mg/M2 and Keytruda 200 mg IV every 3 weeks.  First dose 05/30/2020. Status post 3 cycles. 2) status post robotic assisted right thoracoscopy with right upper lobectomy and lymph node dissection under the care of Dr. Roxan Hockey on October 19, 2020.  There was residual tumor measuring 0.5 cm with lymph node metastasis involving 4R, 12 and hilar lymph nodes.    CURRENT THERAPY: Adjuvant systemic chemotherapy with carboplatin for AUC of 5 and Alimta 500 Mg/M2 every 3 weeks.  First dose was given December 11, 2020.  Status post 1 cycle.  INTERVAL HISTORY: [name] 70 y.o. female returns to the clinic today for follow-up visit.  The patient tolerated the first cycle of her treatment fairly well with no concerning adverse effects except for lack of taste and appetite.  She lost several pounds since her last visit.  She continues to have right-sided chest pain with radiation to the anterior abdomen from the surgical scar and nerve damage.  She was seen by Dr. Roxan Hockey and started treatment with Lyrica with some improvement of her symptoms.  She denied having any shortness of breath, cough or hemoptysis.  She denied having any fever or chills.  She has no nausea, vomiting, diarrhea or constipation.  She has no headache or visual changes.  She is here today for evaluation before starting cycle #2 of her treatment.   MEDICAL HISTORY: Past Medical History:  Diagnosis Date  . Cataract   . COPD (chronic obstructive  pulmonary disease) (HCC)     ALLERGIES:  has No Known Allergies.  MEDICATIONS:  Current Outpatient Medications  Medication Sig Dispense Refill  . albuterol (PROVENTIL HFA;VENTOLIN HFA) 108 (90 Base) MCG/ACT inhaler Inhale 2 puffs into the lungs every 4 (four) hours as needed for wheezing or shortness of breath (cough, shortness of breath or wheezing.). 1 Inhaler 1  . Calcium Carbonate (CALCIUM 600 PO) Take 600 mg by mouth 2 (two) times daily.     . Cholecalciferol (VITAMIN D3 PO) Take 2,000 Units by mouth daily.    Marland Kitchen dexamethasone (DECADRON) 4 MG tablet 1 tablet p.o. twice daily the day before, day of and day after chemotherapy every 3 weeks. 40 tablet 0  . fluticasone (FLONASE) 50 MCG/ACT nasal spray Place 2 sprays into both nostrils daily. 16 g 12  . Fluticasone-Salmeterol (ADVAIR) 250-50 MCG/DOSE AEPB Inhale 1 puff into the lungs 2 (two) times daily.     . folic acid (FOLVITE) 1 MG tablet Take 1 tablet (1 mg total) by mouth daily. 30 tablet 4  . guaiFENesin (MUCINEX) 600 MG 12 hr tablet Take 1 tablet (600 mg total) by mouth 2 (two) times daily as needed for cough or to loosen phlegm.    . Multiple Vitamin (MULTIVITAMIN) tablet Take 1 tablet by mouth daily.    . pregabalin (LYRICA) 25 MG capsule Take 1 capsule (25 mg total) by mouth at bedtime for 3 days, THEN 1 capsule (25 mg total) 2 (two) times daily for 3 days, THEN 1 capsule (25 mg total)  3 (three) times daily. 81 capsule 1  . prochlorperazine (COMPAZINE) 10 MG tablet Take 1 tablet (10 mg total) by mouth every 6 (six) hours as needed. 30 tablet 2  . traMADol (ULTRAM) 50 MG tablet Take 1 tablet (50 mg total) by mouth 2 (two) times daily as needed for up to 28 days for severe pain. 30 tablet 0  . traZODone (DESYREL) 50 MG tablet Take 25-50 mg by mouth at bedtime.     No current facility-administered medications for this visit.    SURGICAL HISTORY:  Past Surgical History:  Procedure Laterality Date  . APPENDECTOMY  2008  . COLON  SURGERY  2008   colostomy-infection post op appendectomy  . COLOSTOMY REVERSAL  2009  . INTERCOSTAL NERVE BLOCK Right 10/19/2020   Procedure: INTERCOSTAL NERVE BLOCK;  Surgeon: [name], MD;  Location: [facility];  Service: Thoracic;  Laterality: Right;  . NODE DISSECTION Right 10/19/2020   Procedure: NODE DISSECTION;  Surgeon: [name], MD;  Location: [facility];  Service: Thoracic;  Laterality: Right;  Marland Kitchen VIDEO BRONCHOSCOPY WITH ENDOBRONCHIAL NAVIGATION N/A 05/08/2020   Procedure: VIDEO BRONCHOSCOPY WITH ENDOBRONCHIAL NAVIGATION;  Surgeon: [name], MD;  Location: [facility];  Service: Thoracic;  Laterality: N/A;  . VIDEO BRONCHOSCOPY WITH ENDOBRONCHIAL ULTRASOUND N/A 05/08/2020   Procedure: VIDEO BRONCHOSCOPY WITH ENDOBRONCHIAL ULTRASOUND;  Surgeon: [name], MD;  Location: [facility];  Service: Thoracic;  Laterality: N/A;    REVIEW OF SYSTEMS:  A comprehensive review of systems was negative except for: Constitutional: positive for anorexia, fatigue and weight loss Respiratory: positive for pleurisy/chest pain   PHYSICAL EXAMINATION: General appearance: alert, cooperative, fatigued and no distress Head: Normocephalic, without obvious abnormality, atraumatic Neck: no adenopathy, no JVD, supple, symmetrical, trachea midline and thyroid not enlarged, symmetric, no tenderness/mass/nodules Lymph nodes: Cervical, supraclavicular, and axillary nodes normal. Resp: clear to auscultation bilaterally Back: symmetric, no curvature. ROM normal. No CVA tenderness. Cardio: regular rate and rhythm, S1, S2 normal, no murmur, click, rub or gallop GI: soft, non-tender; bowel sounds normal; no masses,  no organomegaly Extremities: extremities normal, atraumatic, no cyanosis or edema  ECOG PERFORMANCE STATUS: 1 - Symptomatic but completely ambulatory  Blood pressure 125/84, pulse 69, temperature 97.9 F (36.6 C), temperature source Tympanic, resp. rate 18, height 5\' 3"  (1.6 m),  weight 114 lb 4.8 oz (51.8 kg), SpO2 97 %.  LABORATORY DATA: Lab Results  Component Value Date   WBC 6.1 01/01/2021   HGB 10.6 (L) 01/01/2021   HCT 32.0 (L) 01/01/2021   MCV 96.7 01/01/2021   PLT 265 01/01/2021      Chemistry      Component Value Date/Time   NA 140 01/01/2021 0842   NA 135 10/29/2017 1131   K 3.9 01/01/2021 0842   CL 99 01/01/2021 0842   CO2 26 01/01/2021 0842   BUN 9 01/01/2021 0842   BUN 5 (L) 10/29/2017 1131   CREATININE 0.84 01/01/2021 0842      Component Value Date/Time   CALCIUM 9.6 01/01/2021 0842   ALKPHOS 83 01/01/2021 0842   AST 61 (H) 01/01/2021 0842   ALT 60 (H) 01/01/2021 0842   BILITOT 0.4 01/01/2021 0842       RADIOGRAPHIC STUDIES: DG Chest 2 View  Result Date: 12/26/2020 CLINICAL DATA:  Malignant neoplasm upper lobe of right lung. Shortness of breath and chest pain. EXAM: CHEST - 2 VIEW COMPARISON:  11/07/2020 10/17/2020 FINDINGS: Again noted is volume loss in the right hemithorax with elevation  of the right hemidiaphragm. Findings compatible with postoperative change. This subcutaneous emphysema has resolved. Again noted is a pleural line along the medial right upper chest and not clear if the small pneumothorax has completely resolved. Few densities in the left lower chest are similar to the previous examination. No large pleural effusions. Heart and mediastinum are stable. IMPRESSION: Postoperative changes in the right hemithorax. Difficult to exclude a small residual right apical pneumothorax. Chronic subtle densities at the left lung base. Electronically Signed   By: Markus Daft M.D.   On: 12/26/2020 16:09    ASSESSMENT AND PLAN: This is a very pleasant 70 years old white female recently diagnosed with a stage IIIa (T1b, N2, M0) non-small cell lung cancer, adenocarcinoma presented with right upper lobe lung nodule in addition to mediastinal lymphadenopathy. The patient had MRI of the brain performed recently that showed no evidence of  intracranial metastatic disease. The patient underwent a course of neoadjuvant treatment with carboplatin for AUC of 5, Alimta 500 mg/M2 and Keytruda 200 mg IV every 3 weeks for 3 cycles.   She tolerated this treatment well with no concerning adverse effect except for dry skin and itching. Her repeat CT scan after the neoadjuvant treatment showed partial response with around 50% reduction in the tumor volume. The patient underwent right upper lobectomy with lymph node dissection on October 19, 2020 and the final pathology showed residual tumor measuring 0.5 cm with lymph node metastasis involving hilar and mediastinal lymph nodes. She is currently undergoing adjuvant systemic chemotherapy with carboplatin for AUC of 5, Alimta 500 mg/M2 every 3 weeks since she has good response to this treatment in the past.  She is status post 1 cycle. The patient tolerated the first cycle of her treatment well except for the change of taste as well as lack of appetite and weight loss. I recommended for her to proceed with cycle #2 today as planned. For the liver dysfunction, I will continue to monitor it closely on the weekly lab work. She will come back for follow-up visit in 3 weeks for evaluation before the next cycle of her treatment. The patient was advised to call immediately if she has any concerning symptoms in the interval. The patient voices understanding of current disease status and treatment options and is in agreement with the current care plan.  All questions were answered. The patient knows to call the clinic with any problems, questions or concerns. We can certainly see the patient much sooner if necessary.   Disclaimer: This note was dictated with voice recognition software. Similar sounding words can inadvertently be transcribed and may not be corrected upon review.

## 2021-01-01 NOTE — Telephone Encounter (Signed)
Poor appetite , wt loss- Pt requests something to help with her appetite.  Can she get her second booster?

## 2021-01-02 ENCOUNTER — Encounter: Payer: Medicare Other | Admitting: Thoracic Surgery (Cardiothoracic Vascular Surgery)

## 2021-01-02 ENCOUNTER — Other Ambulatory Visit: Payer: Self-pay | Admitting: Medical Oncology

## 2021-01-02 ENCOUNTER — Telehealth: Payer: Self-pay | Admitting: *Deleted

## 2021-01-02 ENCOUNTER — Other Ambulatory Visit: Payer: Self-pay | Admitting: Thoracic Surgery (Cardiothoracic Vascular Surgery)

## 2021-01-02 DIAGNOSIS — R634 Abnormal weight loss: Secondary | ICD-10-CM

## 2021-01-02 DIAGNOSIS — R630 Anorexia: Secondary | ICD-10-CM

## 2021-01-02 MED ORDER — METHYLPREDNISOLONE 4 MG PO TBPK: ORAL_TABLET | ORAL | 0 refills | Status: DC

## 2021-01-02 MED ORDER — TRAMADOL HCL 50 MG PO TABS: 50.0000 mg | ORAL_TABLET | Freq: Two times a day (BID) | ORAL | 0 refills | Status: AC | PRN

## 2021-01-02 NOTE — Progress Notes (Signed)
Refilled tramadol  Revonda Standard. Roxan Hockey, MD [facility] [phone]

## 2021-01-02 NOTE — Telephone Encounter (Signed)
Patient contacted the office requesting a refill of Tramadol. Patient is s/p Robotic Lobectomy 10/19/20 by Dr. Roxan Hockey. Patient c/o sharp burning pain under her breast. Patient states she has started taking Lyrica as prescribed and it has begun to help somewhat. Per Dr. Roxan Hockey, refill sent into patient's pharmacy. Mr. Anna Genre made aware. No further questions.

## 2021-01-02 NOTE — Telephone Encounter (Signed)
LVM that medrol dose pack sent to pharmacy for poor appetite. COVID vaccine-Per Dr Julien Nordmann I left VM that it is okay for her to get it.

## 2021-01-02 NOTE — Progress Notes (Signed)
Per Dr Julien Nordmann I told husband pt can get her COVID vaccines.

## 2021-01-08 ENCOUNTER — Other Ambulatory Visit: Payer: Self-pay

## 2021-01-08 ENCOUNTER — Inpatient Hospital Stay: Payer: Medicare Other

## 2021-01-08 DIAGNOSIS — Z5111 Encounter for antineoplastic chemotherapy: Secondary | ICD-10-CM | POA: Diagnosis not present

## 2021-01-08 DIAGNOSIS — C3411 Malignant neoplasm of upper lobe, right bronchus or lung: Secondary | ICD-10-CM

## 2021-01-08 LAB — CBC WITH DIFFERENTIAL (CANCER CENTER ONLY)
Abs Immature Granulocytes: 0.01 10*3/uL (ref 0.00–0.07)
Basophils Absolute: 0 10*3/uL (ref 0.0–0.1)
Basophils Relative: 1 %
Eosinophils Absolute: 0 10*3/uL (ref 0.0–0.5)
Eosinophils Relative: 1 %
HCT: 29.6 % — ABNORMAL LOW (ref 36.0–46.0)
Hemoglobin: 9.8 g/dL — ABNORMAL LOW (ref 12.0–15.0)
Immature Granulocytes: 0 %
Lymphocytes Relative: 56 %
Lymphs Abs: 1.8 10*3/uL (ref 0.7–4.0)
MCH: 32.6 pg (ref 26.0–34.0)
MCHC: 33.1 g/dL (ref 30.0–36.0)
MCV: 98.3 fL (ref 80.0–100.0)
Monocytes Absolute: 0.1 10*3/uL (ref 0.1–1.0)
Monocytes Relative: 4 %
Neutro Abs: 1.2 10*3/uL — ABNORMAL LOW (ref 1.7–7.7)
Neutrophils Relative %: 38 %
Platelet Count: 156 10*3/uL (ref 150–400)
RBC: 3.01 MIL/uL — ABNORMAL LOW (ref 3.87–5.11)
RDW: 14.6 % (ref 11.5–15.5)
WBC Count: 3.1 10*3/uL — ABNORMAL LOW (ref 4.0–10.5)
nRBC: 0 % (ref 0.0–0.2)

## 2021-01-08 LAB — CMP (CANCER CENTER ONLY)
ALT: 113 U/L — ABNORMAL HIGH (ref 0–44)
AST: 82 U/L — ABNORMAL HIGH (ref 15–41)
Albumin: 4.5 g/dL (ref 3.5–5.0)
Alkaline Phosphatase: 80 U/L (ref 38–126)
Anion gap: 12 (ref 5–15)
BUN: 9 mg/dL (ref 8–23)
CO2: 31 mmol/L (ref 22–32)
Calcium: 9.6 mg/dL (ref 8.9–10.3)
Chloride: 95 mmol/L — ABNORMAL LOW (ref 98–111)
Creatinine: 0.78 mg/dL (ref 0.44–1.00)
GFR, Estimated: >60 mL/min (ref >60)
Glucose, Bld: 89 mg/dL (ref 70–99)
Potassium: 3.8 mmol/L (ref 3.5–5.1)
Sodium: 138 mmol/L (ref 135–145)
Total Bilirubin: 0.6 mg/dL (ref 0.3–1.2)
Total Protein: 7.2 g/dL (ref 6.5–8.1)

## 2021-01-09 ENCOUNTER — Telehealth: Payer: Self-pay

## 2021-01-09 NOTE — Telephone Encounter (Signed)
Pts husband called with pt on speaker phone and advised pt is constipated. Last time pt had a BM was 3 days ago. They wanted to know if there was something she can take other than Miralax.  I advised the pt that she can try Dulcolax and for more immediate relief, she can also use an enema. I advised them to also call back if her sx worsen. Both pt and her husband expressed understanding of this information.

## 2021-01-15 ENCOUNTER — Other Ambulatory Visit: Payer: Self-pay

## 2021-01-15 ENCOUNTER — Inpatient Hospital Stay: Payer: Medicare Other | Attending: Physician Assistant

## 2021-01-15 DIAGNOSIS — Z5111 Encounter for antineoplastic chemotherapy: Secondary | ICD-10-CM | POA: Diagnosis present

## 2021-01-15 DIAGNOSIS — C771 Secondary and unspecified malignant neoplasm of intrathoracic lymph nodes: Secondary | ICD-10-CM | POA: Diagnosis not present

## 2021-01-15 DIAGNOSIS — C3411 Malignant neoplasm of upper lobe, right bronchus or lung: Secondary | ICD-10-CM | POA: Diagnosis present

## 2021-01-15 LAB — CBC WITH DIFFERENTIAL (CANCER CENTER ONLY)
Abs Immature Granulocytes: 0.02 10*3/uL (ref 0.00–0.07)
Basophils Absolute: 0 10*3/uL (ref 0.0–0.1)
Basophils Relative: 0 %
Eosinophils Absolute: 0 10*3/uL (ref 0.0–0.5)
Eosinophils Relative: 0 %
HCT: 29.8 % — ABNORMAL LOW (ref 36.0–46.0)
Hemoglobin: 10.1 g/dL — ABNORMAL LOW (ref 12.0–15.0)
Immature Granulocytes: 0 %
Lymphocytes Relative: 32 %
Lymphs Abs: 1.5 10*3/uL (ref 0.7–4.0)
MCH: 33.3 pg (ref 26.0–34.0)
MCHC: 33.9 g/dL (ref 30.0–36.0)
MCV: 98.3 fL (ref 80.0–100.0)
Monocytes Absolute: 0.5 10*3/uL (ref 0.1–1.0)
Monocytes Relative: 10 %
Neutro Abs: 2.7 10*3/uL (ref 1.7–7.7)
Neutrophils Relative %: 58 %
Platelet Count: 111 10*3/uL — ABNORMAL LOW (ref 150–400)
RBC: 3.03 MIL/uL — ABNORMAL LOW (ref 3.87–5.11)
RDW: 14.4 % (ref 11.5–15.5)
WBC Count: 4.7 10*3/uL (ref 4.0–10.5)
nRBC: 0 % (ref 0.0–0.2)

## 2021-01-15 LAB — CMP (CANCER CENTER ONLY)
ALT: 122 U/L — ABNORMAL HIGH (ref 0–44)
AST: 71 U/L — ABNORMAL HIGH (ref 15–41)
Albumin: 4.7 g/dL (ref 3.5–5.0)
Alkaline Phosphatase: 99 U/L (ref 38–126)
Anion gap: 10 (ref 5–15)
BUN: 7 mg/dL — ABNORMAL LOW (ref 8–23)
CO2: 30 mmol/L (ref 22–32)
Calcium: 10 mg/dL (ref 8.9–10.3)
Chloride: 99 mmol/L (ref 98–111)
Creatinine: 0.85 mg/dL (ref 0.44–1.00)
GFR, Estimated: >60 mL/min (ref >60)
Glucose, Bld: 89 mg/dL (ref 70–99)
Potassium: 3.8 mmol/L (ref 3.5–5.1)
Sodium: 139 mmol/L (ref 135–145)
Total Bilirubin: 0.5 mg/dL (ref 0.3–1.2)
Total Protein: 7.5 g/dL (ref 6.5–8.1)

## 2021-01-22 ENCOUNTER — Inpatient Hospital Stay (HOSPITAL_BASED_OUTPATIENT_CLINIC_OR_DEPARTMENT_OTHER): Payer: Medicare Other | Admitting: Internal Medicine

## 2021-01-22 ENCOUNTER — Inpatient Hospital Stay: Payer: Medicare Other

## 2021-01-22 ENCOUNTER — Other Ambulatory Visit: Payer: Self-pay

## 2021-01-22 VITALS — BP 119/68 | HR 63 | Temp 97.3°F | Resp 18 | Wt 126.4 lb

## 2021-01-22 DIAGNOSIS — Z5111 Encounter for antineoplastic chemotherapy: Secondary | ICD-10-CM | POA: Diagnosis not present

## 2021-01-22 DIAGNOSIS — C3411 Malignant neoplasm of upper lobe, right bronchus or lung: Secondary | ICD-10-CM | POA: Diagnosis not present

## 2021-01-22 LAB — CBC WITH DIFFERENTIAL (CANCER CENTER ONLY)
Abs Immature Granulocytes: 0.09 10*3/uL — ABNORMAL HIGH (ref 0.00–0.07)
Basophils Absolute: 0 10*3/uL (ref 0.0–0.1)
Basophils Relative: 0 %
Eosinophils Absolute: 0 10*3/uL (ref 0.0–0.5)
Eosinophils Relative: 0 %
HCT: 27.7 % — ABNORMAL LOW (ref 36.0–46.0)
Hemoglobin: 9.3 g/dL — ABNORMAL LOW (ref 12.0–15.0)
Immature Granulocytes: 2 %
Lymphocytes Relative: 17 %
Lymphs Abs: 0.8 10*3/uL (ref 0.7–4.0)
MCH: 33 pg (ref 26.0–34.0)
MCHC: 33.6 g/dL (ref 30.0–36.0)
MCV: 98.2 fL (ref 80.0–100.0)
Monocytes Absolute: 0.2 10*3/uL (ref 0.1–1.0)
Monocytes Relative: 5 %
Neutro Abs: 3.5 10*3/uL (ref 1.7–7.7)
Neutrophils Relative %: 76 %
Platelet Count: 226 10*3/uL (ref 150–400)
RBC: 2.82 MIL/uL — ABNORMAL LOW (ref 3.87–5.11)
RDW: 15.4 % (ref 11.5–15.5)
WBC Count: 4.6 10*3/uL (ref 4.0–10.5)
nRBC: 0 % (ref 0.0–0.2)

## 2021-01-22 LAB — CMP (CANCER CENTER ONLY)
ALT: 57 U/L — ABNORMAL HIGH (ref 0–44)
AST: 54 U/L — ABNORMAL HIGH (ref 15–41)
Albumin: 4.4 g/dL (ref 3.5–5.0)
Alkaline Phosphatase: 90 U/L (ref 38–126)
Anion gap: 14 (ref 5–15)
BUN: 9 mg/dL (ref 8–23)
CO2: 25 mmol/L (ref 22–32)
Calcium: 9 mg/dL (ref 8.9–10.3)
Chloride: 100 mmol/L (ref 98–111)
Creatinine: 0.82 mg/dL (ref 0.44–1.00)
GFR, Estimated: >60 mL/min (ref >60)
Glucose, Bld: 111 mg/dL — ABNORMAL HIGH (ref 70–99)
Potassium: 3.9 mmol/L (ref 3.5–5.1)
Sodium: 139 mmol/L (ref 135–145)
Total Bilirubin: 0.5 mg/dL (ref 0.3–1.2)
Total Protein: 7.3 g/dL (ref 6.5–8.1)

## 2021-01-22 MED ORDER — SODIUM CHLORIDE 0.9 % IV SOLN
500.0000 mg/m2 | Freq: Once | INTRAVENOUS | Status: AC
  Administered 2021-01-22: 800 mg via INTRAVENOUS
  Filled 2021-01-22: qty 20

## 2021-01-22 MED ORDER — CYANOCOBALAMIN 1000 MCG/ML IJ SOLN
INTRAMUSCULAR | Status: AC
  Filled 2021-01-22: qty 1

## 2021-01-22 MED ORDER — SODIUM CHLORIDE 0.9 % IV SOLN
370.0000 mg | Freq: Once | INTRAVENOUS | Status: AC
  Administered 2021-01-22: 370 mg via INTRAVENOUS
  Filled 2021-01-22: qty 37

## 2021-01-22 MED ORDER — SODIUM CHLORIDE 0.9 % IV SOLN
150.0000 mg | Freq: Once | INTRAVENOUS | Status: AC
  Administered 2021-01-22: 150 mg via INTRAVENOUS
  Filled 2021-01-22: qty 150

## 2021-01-22 MED ORDER — CYANOCOBALAMIN 1000 MCG/ML IJ SOLN
1000.0000 ug | Freq: Once | INTRAMUSCULAR | Status: AC
  Administered 2021-01-22: 1000 ug via INTRAMUSCULAR

## 2021-01-22 MED ORDER — SODIUM CHLORIDE 0.9 % IV SOLN
10.0000 mg | Freq: Once | INTRAVENOUS | Status: AC
  Administered 2021-01-22: 10 mg via INTRAVENOUS
  Filled 2021-01-22: qty 10

## 2021-01-22 MED ORDER — PALONOSETRON HCL INJECTION 0.25 MG/5ML
0.2500 mg | Freq: Once | INTRAVENOUS | Status: AC
  Administered 2021-01-22: 0.25 mg via INTRAVENOUS

## 2021-01-22 MED ORDER — PALONOSETRON HCL INJECTION 0.25 MG/5ML
INTRAVENOUS | Status: AC
  Filled 2021-01-22: qty 5

## 2021-01-22 MED ORDER — SODIUM CHLORIDE 0.9 % IV SOLN
Freq: Once | INTRAVENOUS | Status: AC
  Filled 2021-01-22: qty 250

## 2021-01-22 NOTE — Progress Notes (Signed)
[facility] Telephone:([phone]   Fax:([phone]) [phone]  OFFICE PROGRESS NOTE  [name], [address] [place] [address]  DIAGNOSIS: Stage IIIA (T1b, N2, M0) non-small cell lung cancer, adenocarcinoma presented with right upper lobe lung nodule as well as right upper paratracheal adenopathy and suspicious tiny nodule in the right middle lobe.  PRIOR THERAPY:  1) Neoadjuvant systemic chemotherapy with carboplatin for AUC of 5, Alimta 500 mg/M2 and Keytruda 200 mg IV every 3 weeks.  First dose 05/30/2020. Status post 3 cycles. 2) status post robotic assisted right thoracoscopy with right upper lobectomy and lymph node dissection under the care of Dr. Roxan Hockey on October 19, 2020.  There was residual tumor measuring 0.5 cm with lymph node metastasis involving 4R, 12 and hilar lymph nodes.    CURRENT THERAPY: Adjuvant systemic chemotherapy with carboplatin for AUC of 5 and Alimta 500 Mg/M2 every 3 weeks.  First dose was given December 11, 2020.  Status post 2 cycles.  INTERVAL HISTORY: [name] 70 y.o. female returns to the clinic today for follow-up visit.  The patient is feeling fine today with no concerning complaints except for mild aching pain on the right side of the chest from the surgical scar.  She denied having any current shortness of breath, cough or hemoptysis.  She denied having any fever or chills.  She has no nausea, vomiting, diarrhea or constipation.  She has no headache or visual changes.  The patient is here today for evaluation before starting cycle #3 of her treatment.   MEDICAL HISTORY: Past Medical History:  Diagnosis Date  . Cataract   . COPD (chronic obstructive pulmonary disease) (HCC)     ALLERGIES:  has No Known Allergies.  MEDICATIONS:  Current Outpatient Medications  Medication Sig Dispense Refill  . albuterol (PROVENTIL HFA;VENTOLIN HFA) 108 (90 Base) MCG/ACT inhaler Inhale 2 puffs into the lungs  every 4 (four) hours as needed for wheezing or shortness of breath (cough, shortness of breath or wheezing.). 1 Inhaler 1  . Calcium Carbonate (CALCIUM 600 PO) Take 600 mg by mouth 2 (two) times daily.     . Cholecalciferol (VITAMIN D3 PO) Take 2,000 Units by mouth daily.    Marland Kitchen dexamethasone (DECADRON) 4 MG tablet 1 tablet p.o. twice daily the day before, day of and day after chemotherapy every 3 weeks. 40 tablet 0  . fluticasone (FLONASE) 50 MCG/ACT nasal spray Place 2 sprays into both nostrils daily. 16 g 12  . Fluticasone-Salmeterol (ADVAIR) 250-50 MCG/DOSE AEPB Inhale 1 puff into the lungs 2 (two) times daily.     . folic acid (FOLVITE) 1 MG tablet Take 1 tablet (1 mg total) by mouth daily. 30 tablet 4  . guaiFENesin (MUCINEX) 600 MG 12 hr tablet Take 1 tablet (600 mg total) by mouth 2 (two) times daily as needed for cough or to loosen phlegm.    Marland Kitchen HYDROcodone-acetaminophen (NORCO/VICODIN) 5-325 MG tablet Take by mouth.    . methylPREDNISolone (MEDROL DOSEPAK) 4 MG TBPK tablet Take per package instructions. 21 tablet 0  . Multiple Vitamin (MULTIVITAMIN) tablet Take 1 tablet by mouth daily.    . pregabalin (LYRICA) 25 MG capsule Take 1 capsule (25 mg total) by mouth at bedtime for 3 days, THEN 1 capsule (25 mg total) 2 (two) times daily for 3 days, THEN 1 capsule (25 mg total) 3 (three) times daily. 81 capsule 1  . prochlorperazine (COMPAZINE) 10 MG tablet Take  1 tablet (10 mg total) by mouth every 6 (six) hours as needed. 30 tablet 2  . traMADol (ULTRAM) 50 MG tablet Take 1 tablet (50 mg total) by mouth 2 (two) times daily as needed for up to 28 days for severe pain. 30 tablet 0  . traZODone (DESYREL) 50 MG tablet Take 25-50 mg by mouth at bedtime.     No current facility-administered medications for this visit.    SURGICAL HISTORY:  Past Surgical History:  Procedure Laterality Date  . APPENDECTOMY  2008  . COLON SURGERY  2008   colostomy-infection post op appendectomy  . COLOSTOMY  REVERSAL  2009  . INTERCOSTAL NERVE BLOCK Right 10/19/2020   Procedure: INTERCOSTAL NERVE BLOCK;  Surgeon: [name], MD;  Location: [facility];  Service: Thoracic;  Laterality: Right;  . NODE DISSECTION Right 10/19/2020   Procedure: NODE DISSECTION;  Surgeon: [name], MD;  Location: [facility];  Service: Thoracic;  Laterality: Right;  Marland Kitchen VIDEO BRONCHOSCOPY WITH ENDOBRONCHIAL NAVIGATION N/A 05/08/2020   Procedure: VIDEO BRONCHOSCOPY WITH ENDOBRONCHIAL NAVIGATION;  Surgeon: [name], MD;  Location: [facility];  Service: Thoracic;  Laterality: N/A;  . VIDEO BRONCHOSCOPY WITH ENDOBRONCHIAL ULTRASOUND N/A 05/08/2020   Procedure: VIDEO BRONCHOSCOPY WITH ENDOBRONCHIAL ULTRASOUND;  Surgeon: [name], MD;  Location: [facility];  Service: Thoracic;  Laterality: N/A;    REVIEW OF SYSTEMS:  A comprehensive review of systems was negative except for: Constitutional: positive for fatigue Respiratory: positive for pleurisy/chest pain   PHYSICAL EXAMINATION: General appearance: alert, cooperative, fatigued and no distress Head: Normocephalic, without obvious abnormality, atraumatic Neck: no adenopathy, no JVD, supple, symmetrical, trachea midline and thyroid not enlarged, symmetric, no tenderness/mass/nodules Lymph nodes: Cervical, supraclavicular, and axillary nodes normal. Resp: clear to auscultation bilaterally Back: symmetric, no curvature. ROM normal. No CVA tenderness. Cardio: regular rate and rhythm, S1, S2 normal, no murmur, click, rub or gallop GI: soft, non-tender; bowel sounds normal; no masses,  no organomegaly Extremities: extremities normal, atraumatic, no cyanosis or edema  ECOG PERFORMANCE STATUS: 1 - Symptomatic but completely ambulatory  Blood pressure 119/68, pulse 63, temperature (!) 97.3 F (36.3 C), temperature source Tympanic, resp. rate 18, weight 126 lb 6.4 oz (57.3 kg), SpO2 97 %.  LABORATORY DATA: Lab Results  Component Value Date   WBC 4.6  01/22/2021   HGB 9.3 (L) 01/22/2021   HCT 27.7 (L) 01/22/2021   MCV 98.2 01/22/2021   PLT 226 01/22/2021      Chemistry      Component Value Date/Time   NA 139 01/15/2021 0905   NA 135 10/29/2017 1131   K 3.8 01/15/2021 0905   CL 99 01/15/2021 0905   CO2 30 01/15/2021 0905   BUN 7 (L) 01/15/2021 0905   BUN 5 (L) 10/29/2017 1131   CREATININE 0.85 01/15/2021 0905      Component Value Date/Time   CALCIUM 10.0 01/15/2021 0905   ALKPHOS 99 01/15/2021 0905   AST 71 (H) 01/15/2021 0905   ALT 122 (H) 01/15/2021 0905   BILITOT 0.5 01/15/2021 0905       RADIOGRAPHIC STUDIES: DG Chest 2 View  Result Date: 12/26/2020 CLINICAL DATA:  Malignant neoplasm upper lobe of right lung. Shortness of breath and chest pain. EXAM: CHEST - 2 VIEW COMPARISON:  11/07/2020 10/17/2020 FINDINGS: Again noted is volume loss in the right hemithorax with elevation of the right hemidiaphragm. Findings compatible with postoperative change. This subcutaneous emphysema has resolved. Again noted is a pleural line along the  medial right upper chest and not clear if the small pneumothorax has completely resolved. Few densities in the left lower chest are similar to the previous examination. No large pleural effusions. Heart and mediastinum are stable. IMPRESSION: Postoperative changes in the right hemithorax. Difficult to exclude a small residual right apical pneumothorax. Chronic subtle densities at the left lung base. Electronically Signed   By: Markus Daft M.D.   On: 12/26/2020 16:09    ASSESSMENT AND PLAN: This is a very pleasant 70 years old white female recently diagnosed with a stage IIIa (T1b, N2, M0) non-small cell lung cancer, adenocarcinoma presented with right upper lobe lung nodule in addition to mediastinal lymphadenopathy. The patient had MRI of the brain performed recently that showed no evidence of intracranial metastatic disease. The patient underwent a course of neoadjuvant treatment with carboplatin  for AUC of 5, Alimta 500 mg/M2 and Keytruda 200 mg IV every 3 weeks for 3 cycles.   She tolerated this treatment well with no concerning adverse effect except for dry skin and itching. Her repeat CT scan after the neoadjuvant treatment showed partial response with around 50% reduction in the tumor volume. The patient underwent right upper lobectomy with lymph node dissection on October 19, 2020 and the final pathology showed residual tumor measuring 0.5 cm with lymph node metastasis involving hilar and mediastinal lymph nodes. She is currently undergoing adjuvant systemic chemotherapy with carboplatin for AUC of 5, Alimta 500 mg/M2 every 3 weeks since she has good response to this treatment in the past.  She is status post 2 cycles. She has been tolerating this treatment well except for fatigue. I recommended for her to proceed with cycle #3 today as planned. I will see her back for follow-up visit in 3 weeks for evaluation before the fourth cycle of her treatment. The patient was advised to call immediately if she has any other concerning symptoms in the interval. The patient voices understanding of current disease status and treatment options and is in agreement with the current care plan.  All questions were answered. The patient knows to call the clinic with any problems, questions or concerns. We can certainly see the patient much sooner if necessary.   Disclaimer: This note was dictated with voice recognition software. Similar sounding words can inadvertently be transcribed and may not be corrected upon review.

## 2021-01-22 NOTE — Patient Instructions (Signed)
[facility]COL[facility]  Discharge Instructions: Thank you for choosing [facility] to provide your oncology and hematology care.   If you have a lab appointment with the [facility], please go directly to the [facility] and check in at the registration area.   Wear comfortable clothing and clothing appropriate for easy access to any Portacath or PICC line.   We strive to give you quality time with your provider. You may need to reschedule your appointment if you arrive late (15 or more minutes).  Arriving late affects you and other patients whose appointments are after yours.  Also, if you miss three or more appointments without notifying the office, you may be dismissed from the clinic at the provider's discretion.      For prescription refill requests, have your pharmacy contact our office and allow 72 hours for refills to be completed.    Today you received the following chemotherapy and/or immunotherapy agents: Alimta, Carboplatin.       To help prevent nausea and vomiting after your treatment, we encourage you to take your nausea medication as directed.  BELOW ARE SYMPTOMS THAT SHOULD BE REPORTED IMMEDIATELY: . *FEVER GREATER THAN 100.4 F (38 C) OR HIGHER . *CHILLS OR SWEATING . *NAUSEA AND VOMITING THAT IS NOT CONTROLLED WITH YOUR NAUSEA MEDICATION . *UNUSUAL SHORTNESS OF BREATH . *UNUSUAL BRUISING OR BLEEDING . *URINARY PROBLEMS (pain or burning when urinating, or frequent urination) . *BOWEL PROBLEMS (unusual diarrhea, constipation, pain near the anus) . TENDERNESS IN MOUTH AND THROAT WITH OR WITHOUT PRESENCE OF ULCERS (sore throat, sores in mouth, or a toothache) . UNUSUAL RASH, SWELLING OR PAIN  . UNUSUAL VAGINAL DISCHARGE OR ITCHING   Items with * indicate a potential emergency and should be followed up as soon as possible or go to the Emergency Department if any problems should occur.  Please show the CHEMOTHERAPY ALERT CARD or  IMMUNOTHERAPY ALERT CARD at check-in to the Emergency Department and triage nurse.  Should you have questions after your visit or need to cancel or reschedule your appointment, please contact [facility]  Dept: [phone]  and follow the prompts.  Office hours are 8:00 a.m. to 4:30 p.m. Monday - Friday. Please note that voicemails left after 4:00 p.m. may not be returned until the following business day.  We are closed weekends and major holidays. You have access to a nurse at all times for urgent questions. Please call the main number to the clinic Dept: [phone] and follow the prompts.   For any non-urgent questions, you may also contact your provider using MyChart. We now offer e-Visits for anyone 34 and older to request care online for non-urgent symptoms. For details visit [email or web address].   Also download the MyChart app! Go to the app store, search "MyChart", open the app, select [facility], and log in with your MyChart username and password.  Due to Covid, a mask is required upon entering the hospital/clinic. If you do not have a mask, one will be given to you upon arrival. For doctor visits, patients may have 1 support person aged 54 or older with them. For treatment visits, patients cannot have anyone with them due to current Covid guidelines and our immunocompromised population.

## 2021-01-29 ENCOUNTER — Inpatient Hospital Stay: Payer: Medicare Other

## 2021-01-29 ENCOUNTER — Other Ambulatory Visit: Payer: Self-pay

## 2021-01-29 DIAGNOSIS — Z5111 Encounter for antineoplastic chemotherapy: Secondary | ICD-10-CM | POA: Diagnosis not present

## 2021-01-29 DIAGNOSIS — C3411 Malignant neoplasm of upper lobe, right bronchus or lung: Secondary | ICD-10-CM

## 2021-01-29 LAB — CMP (CANCER CENTER ONLY)
ALT: 215 U/L — ABNORMAL HIGH (ref 0–44)
AST: 167 U/L — ABNORMAL HIGH (ref 15–41)
Albumin: 4.3 g/dL (ref 3.5–5.0)
Alkaline Phosphatase: 85 U/L (ref 38–126)
Anion gap: 8 (ref 5–15)
BUN: 8 mg/dL (ref 8–23)
CO2: 32 mmol/L (ref 22–32)
Calcium: 8.8 mg/dL — ABNORMAL LOW (ref 8.9–10.3)
Chloride: 96 mmol/L — ABNORMAL LOW (ref 98–111)
Creatinine: 0.75 mg/dL (ref 0.44–1.00)
GFR, Estimated: >60 mL/min (ref >60)
Glucose, Bld: 90 mg/dL (ref 70–99)
Potassium: 3.5 mmol/L (ref 3.5–5.1)
Sodium: 136 mmol/L (ref 135–145)
Total Bilirubin: 0.6 mg/dL (ref 0.3–1.2)
Total Protein: 6.6 g/dL (ref 6.5–8.1)

## 2021-01-29 LAB — CBC WITH DIFFERENTIAL (CANCER CENTER ONLY)
Abs Immature Granulocytes: 0.01 10*3/uL (ref 0.00–0.07)
Basophils Absolute: 0 10*3/uL (ref 0.0–0.1)
Basophils Relative: 1 %
Eosinophils Absolute: 0 10*3/uL (ref 0.0–0.5)
Eosinophils Relative: 0 %
HCT: 25.5 % — ABNORMAL LOW (ref 36.0–46.0)
Hemoglobin: 8.5 g/dL — ABNORMAL LOW (ref 12.0–15.0)
Immature Granulocytes: 0 %
Lymphocytes Relative: 47 %
Lymphs Abs: 1.4 10*3/uL (ref 0.7–4.0)
MCH: 33.2 pg (ref 26.0–34.0)
MCHC: 33.3 g/dL (ref 30.0–36.0)
MCV: 99.6 fL (ref 80.0–100.0)
Monocytes Absolute: 0.1 10*3/uL (ref 0.1–1.0)
Monocytes Relative: 5 %
Neutro Abs: 1.4 10*3/uL — ABNORMAL LOW (ref 1.7–7.7)
Neutrophils Relative %: 47 %
Platelet Count: 155 10*3/uL (ref 150–400)
RBC: 2.56 MIL/uL — ABNORMAL LOW (ref 3.87–5.11)
RDW: 15 % (ref 11.5–15.5)
WBC Count: 2.9 10*3/uL — ABNORMAL LOW (ref 4.0–10.5)
nRBC: 0 % (ref 0.0–0.2)

## 2021-01-30 ENCOUNTER — Ambulatory Visit: Payer: Medicare Other | Admitting: Thoracic Surgery (Cardiothoracic Vascular Surgery)

## 2021-02-05 ENCOUNTER — Other Ambulatory Visit: Payer: Self-pay

## 2021-02-05 ENCOUNTER — Inpatient Hospital Stay: Payer: Medicare Other

## 2021-02-05 DIAGNOSIS — C3411 Malignant neoplasm of upper lobe, right bronchus or lung: Secondary | ICD-10-CM

## 2021-02-05 DIAGNOSIS — Z5111 Encounter for antineoplastic chemotherapy: Secondary | ICD-10-CM | POA: Diagnosis not present

## 2021-02-05 LAB — CBC WITH DIFFERENTIAL (CANCER CENTER ONLY)
Abs Immature Granulocytes: 0.02 10*3/uL (ref 0.00–0.07)
Basophils Absolute: 0 10*3/uL (ref 0.0–0.1)
Basophils Relative: 0 %
Eosinophils Absolute: 0 10*3/uL (ref 0.0–0.5)
Eosinophils Relative: 0 %
HCT: 23 % — ABNORMAL LOW (ref 36.0–46.0)
Hemoglobin: 7.7 g/dL — ABNORMAL LOW (ref 12.0–15.0)
Immature Granulocytes: 0 %
Lymphocytes Relative: 31 %
Lymphs Abs: 1.5 10*3/uL (ref 0.7–4.0)
MCH: 33.9 pg (ref 26.0–34.0)
MCHC: 33.5 g/dL (ref 30.0–36.0)
MCV: 101.3 fL — ABNORMAL HIGH (ref 80.0–100.0)
Monocytes Absolute: 0.4 10*3/uL (ref 0.1–1.0)
Monocytes Relative: 9 %
Neutro Abs: 2.9 10*3/uL (ref 1.7–7.7)
Neutrophils Relative %: 60 %
Platelet Count: 62 10*3/uL — ABNORMAL LOW (ref 150–400)
RBC: 2.27 MIL/uL — ABNORMAL LOW (ref 3.87–5.11)
RDW: 15.6 % — ABNORMAL HIGH (ref 11.5–15.5)
WBC Count: 4.9 10*3/uL (ref 4.0–10.5)
nRBC: 0 % (ref 0.0–0.2)

## 2021-02-05 LAB — CMP (CANCER CENTER ONLY)
ALT: 77 U/L — ABNORMAL HIGH (ref 0–44)
AST: 84 U/L — ABNORMAL HIGH (ref 15–41)
Albumin: 4.1 g/dL (ref 3.5–5.0)
Alkaline Phosphatase: 75 U/L (ref 38–126)
Anion gap: 8 (ref 5–15)
BUN: 7 mg/dL — ABNORMAL LOW (ref 8–23)
CO2: 30 mmol/L (ref 22–32)
Calcium: 9.5 mg/dL (ref 8.9–10.3)
Chloride: 100 mmol/L (ref 98–111)
Creatinine: 0.8 mg/dL (ref 0.44–1.00)
GFR, Estimated: >60 mL/min (ref >60)
Glucose, Bld: 84 mg/dL (ref 70–99)
Potassium: 3.3 mmol/L — ABNORMAL LOW (ref 3.5–5.1)
Sodium: 138 mmol/L (ref 135–145)
Total Bilirubin: 0.4 mg/dL (ref 0.3–1.2)
Total Protein: 6.6 g/dL (ref 6.5–8.1)

## 2021-02-07 ENCOUNTER — Inpatient Hospital Stay: Payer: Medicare Other

## 2021-02-07 ENCOUNTER — Other Ambulatory Visit: Payer: Self-pay

## 2021-02-07 ENCOUNTER — Telehealth: Payer: Self-pay

## 2021-02-07 ENCOUNTER — Telehealth: Payer: Self-pay | Admitting: Internal Medicine

## 2021-02-07 DIAGNOSIS — C3411 Malignant neoplasm of upper lobe, right bronchus or lung: Secondary | ICD-10-CM

## 2021-02-07 DIAGNOSIS — Z5111 Encounter for antineoplastic chemotherapy: Secondary | ICD-10-CM | POA: Diagnosis not present

## 2021-02-07 LAB — CBC WITH DIFFERENTIAL (CANCER CENTER ONLY)
Abs Immature Granulocytes: 0.02 10*3/uL (ref 0.00–0.07)
Basophils Absolute: 0 10*3/uL (ref 0.0–0.1)
Basophils Relative: 0 %
Eosinophils Absolute: 0 10*3/uL (ref 0.0–0.5)
Eosinophils Relative: 0 %
HCT: 24.5 % — ABNORMAL LOW (ref 36.0–46.0)
Hemoglobin: 8.1 g/dL — ABNORMAL LOW (ref 12.0–15.0)
Immature Granulocytes: 0 %
Lymphocytes Relative: 29 %
Lymphs Abs: 1.3 10*3/uL (ref 0.7–4.0)
MCH: 34 pg (ref 26.0–34.0)
MCHC: 33.1 g/dL (ref 30.0–36.0)
MCV: 102.9 fL — ABNORMAL HIGH (ref 80.0–100.0)
Monocytes Absolute: 0.4 10*3/uL (ref 0.1–1.0)
Monocytes Relative: 8 %
Neutro Abs: 2.8 10*3/uL (ref 1.7–7.7)
Neutrophils Relative %: 63 %
Platelet Count: 93 10*3/uL — ABNORMAL LOW (ref 150–400)
RBC: 2.38 MIL/uL — ABNORMAL LOW (ref 3.87–5.11)
RDW: 16.3 % — ABNORMAL HIGH (ref 11.5–15.5)
WBC Count: 4.5 10*3/uL (ref 4.0–10.5)
nRBC: 0 % (ref 0.0–0.2)

## 2021-02-07 LAB — CMP (CANCER CENTER ONLY)
ALT: 83 U/L — ABNORMAL HIGH (ref 0–44)
AST: 89 U/L — ABNORMAL HIGH (ref 15–41)
Albumin: 4.3 g/dL (ref 3.5–5.0)
Alkaline Phosphatase: 84 U/L (ref 38–126)
Anion gap: 10 (ref 5–15)
BUN: 9 mg/dL (ref 8–23)
CO2: 31 mmol/L (ref 22–32)
Calcium: 9.8 mg/dL (ref 8.9–10.3)
Chloride: 98 mmol/L (ref 98–111)
Creatinine: 0.77 mg/dL (ref 0.44–1.00)
GFR, Estimated: >60 mL/min (ref >60)
Glucose, Bld: 85 mg/dL (ref 70–99)
Potassium: 3.4 mmol/L — ABNORMAL LOW (ref 3.5–5.1)
Sodium: 139 mmol/L (ref 135–145)
Total Bilirubin: 0.4 mg/dL (ref 0.3–1.2)
Total Protein: 6.9 g/dL (ref 6.5–8.1)

## 2021-02-07 NOTE — Telephone Encounter (Signed)
Scheduled appt sper 5/25 sch msg. Pt aware.

## 2021-02-07 NOTE — Telephone Encounter (Signed)
I have spoken with the pt and advised as indicated.  Pt has been scheduled to return for a lab appt today 02/07/21 at 1pm and for transfusion on Saturday 02/10/21 at 8am. Pt expressed understanding of this information.

## 2021-02-07 NOTE — Telephone Encounter (Signed)
-----   Message from [employer], PA-C sent at 02/06/2021  5:05 PM EDT ----- Were you guys able to see this pod message? ----- Message ----- From: Curt Bears, MD Sent: 02/05/2021   9:17 PM EDT To: [facility] Pod 3  She will need PRBCs transfusions this week.  1-2 units. ----- Message ----- From: Buel Ream, [facility] [place] Sent: 02/05/2021   9:36 AM EDT To: Curt Bears, MD

## 2021-02-10 ENCOUNTER — Inpatient Hospital Stay: Payer: Medicare Other

## 2021-02-10 ENCOUNTER — Other Ambulatory Visit: Payer: Self-pay

## 2021-02-10 DIAGNOSIS — C3411 Malignant neoplasm of upper lobe, right bronchus or lung: Secondary | ICD-10-CM

## 2021-02-10 DIAGNOSIS — Z5111 Encounter for antineoplastic chemotherapy: Secondary | ICD-10-CM | POA: Diagnosis not present

## 2021-02-10 LAB — PREPARE RBC (CROSSMATCH)

## 2021-02-10 MED ORDER — SODIUM CHLORIDE 0.9% IV SOLUTION
250.0000 mL | Freq: Once | INTRAVENOUS | Status: AC
  Administered 2021-02-10: 250 mL via INTRAVENOUS
  Filled 2021-02-10: qty 250

## 2021-02-10 NOTE — Patient Instructions (Signed)
https://www.[facility].org/donate-blood/blood-donation-process/what-happens-to-donated-blood/blood-transfusions/types-of-blood-transfusions.html"> https://www.[facility].org/donate-blood/blood-donation-process/what-happens-to-donated-blood/blood-transfusions/risks-complications.html">  Blood Transfusion, Adult, Care After This sheet gives you information about how to care for yourself after your procedure. Your health care provider may also give you more specific instructions. If you have problems or questions, contact your health care provider. What can I expect after the procedure? After the procedure, it is common to have:  Bruising and soreness where the IV was inserted.  A fever or chills on the day of the procedure. This may be your body's response to the new blood cells received.  A headache. Follow these instructions at home: IV insertion site care  Follow instructions from your health care provider about how to take care of your IV insertion site. Make sure you: ? Wash your hands with soap and water before and after you change your bandage (dressing). If soap and water are not available, use hand sanitizer. ? Change your dressing as told by your health care provider.  Check your IV insertion site every day for signs of infection. Check for: ? Redness, swelling, or pain. ? Bleeding from the site. ? Warmth. ? Pus or a bad smell.      General instructions  Take over-the-counter and prescription medicines only as told by your health care provider.  Rest as told by your health care provider.  Return to your normal activities as told by your health care provider.  Keep all follow-up visits as told by your health care provider. This is important. Contact a health care provider if:  You have itching or red, swollen areas of skin (hives).  You feel anxious.  You feel weak after doing your normal activities.  You have redness, swelling, warmth, or pain around the IV  insertion site.  You have blood coming from the IV insertion site that does not stop with pressure.  You have pus or a bad smell coming from your IV insertion site. Get help right away if:  You have symptoms of a serious allergic or immune system reaction, including: ? Trouble breathing or shortness of breath. ? Swelling of the face or feeling flushed. ? Fever or chills. ? Pain in the head, back, or chest. ? Dark urine or blood in the urine. ? Widespread rash. ? Fast heartbeat. ? Feeling dizzy or light-headed. If you receive your blood transfusion in an outpatient setting, you will be told whom to contact to report any reactions. These symptoms may represent a serious problem that is an emergency. Do not wait to see if the symptoms will go away. Get medical help right away. Call your local emergency services (911 in the [place] Do not drive yourself to the hospital. Summary  Bruising and tenderness around the IV insertion site are common.  Check your IV insertion site every day for signs of infection.  Rest as told by your health care provider. Return to your normal activities as told by your health care provider.  Get help right away for symptoms of a serious allergic or immune system reaction to blood transfusion. This information is not intended to replace advice given to you by your health care provider. Make sure you discuss any questions you have with your health care provider. Document Revised: 02/25/2019 Document Reviewed: 02/25/2019 Elsevier Patient Education  2021 Elsevier Inc.  

## 2021-02-12 LAB — TYPE AND SCREEN
ABO/RH(D): A POS
Antibody Screen: NEGATIVE
Unit division: 0
Unit division: 0

## 2021-02-12 LAB — BPAM RBC
Blood Product Expiration Date: 202206182359
Blood Product Expiration Date: 202206182359
ISSUE DATE / TIME: 202205280833
ISSUE DATE / TIME: 202205280833
Unit Type and Rh: 6200
Unit Type and Rh: 6200

## 2021-02-13 ENCOUNTER — Other Ambulatory Visit: Payer: Self-pay

## 2021-02-13 ENCOUNTER — Encounter: Payer: Self-pay | Admitting: Internal Medicine

## 2021-02-13 ENCOUNTER — Inpatient Hospital Stay: Payer: Medicare Other

## 2021-02-13 ENCOUNTER — Inpatient Hospital Stay (HOSPITAL_BASED_OUTPATIENT_CLINIC_OR_DEPARTMENT_OTHER): Payer: Medicare Other | Admitting: Internal Medicine

## 2021-02-13 ENCOUNTER — Encounter: Payer: Self-pay | Admitting: *Deleted

## 2021-02-13 VITALS — BP 133/64 | HR 64 | Temp 98.0°F | Resp 18 | Wt 125.6 lb

## 2021-02-13 DIAGNOSIS — C3411 Malignant neoplasm of upper lobe, right bronchus or lung: Secondary | ICD-10-CM

## 2021-02-13 DIAGNOSIS — Z5111 Encounter for antineoplastic chemotherapy: Secondary | ICD-10-CM

## 2021-02-13 DIAGNOSIS — C3490 Malignant neoplasm of unspecified part of unspecified bronchus or lung: Secondary | ICD-10-CM

## 2021-02-13 LAB — CMP (CANCER CENTER ONLY)
ALT: 53 U/L — ABNORMAL HIGH (ref 0–44)
AST: 69 U/L — ABNORMAL HIGH (ref 15–41)
Albumin: 4.4 g/dL (ref 3.5–5.0)
Alkaline Phosphatase: 77 U/L (ref 38–126)
Anion gap: 14 (ref 5–15)
BUN: 8 mg/dL (ref 8–23)
CO2: 26 mmol/L (ref 22–32)
Calcium: 9.8 mg/dL (ref 8.9–10.3)
Chloride: 102 mmol/L (ref 98–111)
Creatinine: 0.8 mg/dL (ref 0.44–1.00)
GFR, Estimated: >60 mL/min (ref >60)
Glucose, Bld: 97 mg/dL (ref 70–99)
Potassium: 4.2 mmol/L (ref 3.5–5.1)
Sodium: 142 mmol/L (ref 135–145)
Total Bilirubin: 0.5 mg/dL (ref 0.3–1.2)
Total Protein: 7.2 g/dL (ref 6.5–8.1)

## 2021-02-13 LAB — CBC WITH DIFFERENTIAL (CANCER CENTER ONLY)
Abs Immature Granulocytes: 0.1 10*3/uL — ABNORMAL HIGH (ref 0.00–0.07)
Basophils Absolute: 0 10*3/uL (ref 0.0–0.1)
Basophils Relative: 0 %
Eosinophils Absolute: 0 10*3/uL (ref 0.0–0.5)
Eosinophils Relative: 0 %
HCT: 34.6 % — ABNORMAL LOW (ref 36.0–46.0)
Hemoglobin: 11.9 g/dL — ABNORMAL LOW (ref 12.0–15.0)
Immature Granulocytes: 2 %
Lymphocytes Relative: 14 %
Lymphs Abs: 0.8 10*3/uL (ref 0.7–4.0)
MCH: 32.4 pg (ref 26.0–34.0)
MCHC: 34.4 g/dL (ref 30.0–36.0)
MCV: 94.3 fL (ref 80.0–100.0)
Monocytes Absolute: 0.6 10*3/uL (ref 0.1–1.0)
Monocytes Relative: 10 %
Neutro Abs: 4.1 10*3/uL (ref 1.7–7.7)
Neutrophils Relative %: 74 %
Platelet Count: 205 10*3/uL (ref 150–400)
RBC: 3.67 MIL/uL — ABNORMAL LOW (ref 3.87–5.11)
RDW: 17.7 % — ABNORMAL HIGH (ref 11.5–15.5)
WBC Count: 5.6 10*3/uL (ref 4.0–10.5)
nRBC: 0 % (ref 0.0–0.2)

## 2021-02-13 MED ORDER — FAMOTIDINE 20 MG IN NS 100 ML IVPB
INTRAVENOUS | Status: AC
  Filled 2021-02-13: qty 100

## 2021-02-13 MED ORDER — PALONOSETRON HCL INJECTION 0.25 MG/5ML
INTRAVENOUS | Status: AC
  Filled 2021-02-13: qty 5

## 2021-02-13 MED ORDER — SODIUM CHLORIDE 0.9 % IV SOLN
500.0000 mg/m2 | Freq: Once | INTRAVENOUS | Status: AC
  Administered 2021-02-13: 800 mg via INTRAVENOUS
  Filled 2021-02-13: qty 20

## 2021-02-13 MED ORDER — DIPHENHYDRAMINE HCL 50 MG/ML IJ SOLN
INTRAMUSCULAR | Status: AC
  Filled 2021-02-13: qty 1

## 2021-02-13 MED ORDER — PALONOSETRON HCL INJECTION 0.25 MG/5ML
0.2500 mg | Freq: Once | INTRAVENOUS | Status: AC
  Administered 2021-02-13: 0.25 mg via INTRAVENOUS

## 2021-02-13 MED ORDER — FAMOTIDINE 20 MG IN NS 100 ML IVPB
20.0000 mg | Freq: Once | INTRAVENOUS | Status: AC
  Administered 2021-02-13: 20 mg via INTRAVENOUS

## 2021-02-13 MED ORDER — SODIUM CHLORIDE 0.9 % IV SOLN
Freq: Once | INTRAVENOUS | Status: AC
  Filled 2021-02-13: qty 250

## 2021-02-13 MED ORDER — SODIUM CHLORIDE 0.9 % IV SOLN
370.0000 mg | Freq: Once | INTRAVENOUS | Status: AC
  Administered 2021-02-13: 370 mg via INTRAVENOUS
  Filled 2021-02-13: qty 37

## 2021-02-13 MED ORDER — SODIUM CHLORIDE 0.9 % IV SOLN
150.0000 mg | Freq: Once | INTRAVENOUS | Status: AC
  Administered 2021-02-13: 150 mg via INTRAVENOUS
  Filled 2021-02-13: qty 150

## 2021-02-13 MED ORDER — SODIUM CHLORIDE 0.9 % IV SOLN
10.0000 mg | Freq: Once | INTRAVENOUS | Status: AC
  Administered 2021-02-13: 10 mg via INTRAVENOUS
  Filled 2021-02-13: qty 10

## 2021-02-13 MED ORDER — DIPHENHYDRAMINE HCL 50 MG/ML IJ SOLN
25.0000 mg | Freq: Once | INTRAMUSCULAR | Status: AC
  Administered 2021-02-13: 25 mg via INTRAVENOUS

## 2021-02-13 NOTE — Progress Notes (Signed)
[facility] Telephone:([phone]) [phone]   [phone]  OFFICE PROGRESS NOTE  [name]  [place] [address]  DIAGNOSIS: Stage IIIA (T1b, N2, M0) non-small cell lung cancer, adenocarcinoma presented with right upper lobe lung nodule as well as right upper paratracheal adenopathy and suspicious tiny nodule in the right middle lobe.  PRIOR THERAPY:  1) Neoadjuvant systemic chemotherapy with carboplatin for AUC of 5, Alimta 500 mg/M2 and Keytruda 200 mg IV every 3 weeks.  First dose 05/30/2020. Status post 3 cycles. 2) status post robotic assisted right thoracoscopy with right upper lobectomy and lymph node dissection under the care of Dr. Roxan Hockey on October 19, 2020.  There was residual tumor measuring 0.5 cm with lymph node metastasis involving 4R, 12 and hilar lymph nodes.    CURRENT THERAPY: Adjuvant systemic chemotherapy with carboplatin for AUC of 5 and Alimta 500 Mg/M2 every 3 weeks.  First dose was given December 11, 2020.  Status post 3 cycles.  INTERVAL HISTORY: [name] 70 y.o. female returns to the clinic today for follow-up visit.  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  She denied having any chest pain, shortness of breath, cough or hemoptysis.  She denied having any fever or chills.  She has no nausea, vomiting, diarrhea or constipation.  She denied having any headache or visual changes.  She has no weight loss or night sweats.  She continues to tolerate her treatment fairly well.  The patient is here today for evaluation before starting cycle #4 of her treatment.   MEDICAL HISTORY: Past Medical History:  Diagnosis Date  . Cataract   . COPD (chronic obstructive pulmonary disease) (HCC)     ALLERGIES:  has No Known Allergies.  MEDICATIONS:  Current  Outpatient Medications  Medication Sig Dispense Refill  . albuterol (PROVENTIL HFA;VENTOLIN HFA) 108 (90 Base) MCG/ACT inhaler Inhale 2 puffs into the lungs every 4 (four) hours as needed for wheezing or shortness of breath (cough, shortness of breath or wheezing.). 1 Inhaler 1  . Calcium Carbonate (CALCIUM 600 PO) Take 600 mg by mouth 2 (two) times daily.     . Cholecalciferol (VITAMIN D3 PO) Take 2,000 Units by mouth daily.    Marland Kitchen dexamethasone (DECADRON) 4 MG tablet 1 tablet p.o. twice daily the day before, day of and day after chemotherapy every 3 weeks. 40 tablet 0  . fluticasone (FLONASE) 50 MCG/ACT nasal spray Place 2 sprays into both nostrils daily. 16 g 12  . Fluticasone-Salmeterol (ADVAIR) 250-50 MCG/DOSE AEPB Inhale 1 puff into the lungs 2 (two) times daily.     . folic acid (FOLVITE) 1 MG tablet Take 1 tablet (1 mg total) by mouth daily. 30 tablet 4  . guaiFENesin (MUCINEX) 600 MG 12 hr tablet Take 1 tablet (600 mg total) by mouth 2 (two) times daily as needed for cough or to loosen phlegm.    . methylPREDNISolone (MEDROL DOSEPAK) 4 MG TBPK tablet Take per package instructions. 21 tablet 0  . Multiple Vitamin (MULTIVITAMIN) tablet Take 1 tablet by mouth daily.    . pregabalin (LYRICA) 25 MG capsule Take 1 capsule (25 mg total) by mouth at bedtime for 3 days, THEN 1 capsule (25 mg total) 2 (two) times daily for 3 days, THEN 1 capsule (25 mg total) 3 (three)  times daily. 81 capsule 1  . prochlorperazine (COMPAZINE) 10 MG tablet Take 1 tablet (10 mg total) by mouth every 6 (six) hours as needed. 30 tablet 2  . traZODone (DESYREL) 50 MG tablet Take 25-50 mg by mouth at bedtime.     No current facility-administered medications for this visit.    SURGICAL HISTORY:  Past Surgical History:  Procedure Laterality Date  . APPENDECTOMY  2008  . COLON SURGERY  2008   colostomy-infection post op appendectomy  . COLOSTOMY REVERSAL  2009  . INTERCOSTAL NERVE BLOCK Right 10/19/2020   Procedure:  INTERCOSTAL NERVE BLOCK;  Surgeon: [name], MD;  Location: [facility];  Service: Thoracic;  Laterality: Right;  . NODE DISSECTION Right 10/19/2020   Procedure: NODE DISSECTION;  Surgeon: [name], MD;  Location: [facility];  Service: Thoracic;  Laterality: Right;  Marland Kitchen VIDEO BRONCHOSCOPY WITH ENDOBRONCHIAL NAVIGATION N/A 05/08/2020   Procedure: VIDEO BRONCHOSCOPY WITH ENDOBRONCHIAL NAVIGATION;  Surgeon: [name], MD;  Location: [facility];  Service: Thoracic;  Laterality: N/A;  . VIDEO BRONCHOSCOPY WITH ENDOBRONCHIAL ULTRASOUND N/A 05/08/2020   Procedure: VIDEO BRONCHOSCOPY WITH ENDOBRONCHIAL ULTRASOUND;  Surgeon: [name], MD;  Location: [facility];  Service: Thoracic;  Laterality: N/A;    REVIEW OF SYSTEMS:  A comprehensive review of systems was negative except for: Respiratory: positive for pleurisy/chest pain   PHYSICAL EXAMINATION: General appearance: alert, cooperative, fatigued and no distress Head: Normocephalic, without obvious abnormality, atraumatic Neck: no adenopathy, no JVD, supple, symmetrical, trachea midline and thyroid not enlarged, symmetric, no tenderness/mass/nodules Lymph nodes: Cervical, supraclavicular, and axillary nodes normal. Resp: clear to auscultation bilaterally Back: symmetric, no curvature. ROM normal. No CVA tenderness. Cardio: regular rate and rhythm, S1, S2 normal, no murmur, click, rub or gallop GI: soft, non-tender; bowel sounds normal; no masses,  no organomegaly Extremities: extremities normal, atraumatic, no cyanosis or edema  ECOG PERFORMANCE STATUS: 1 - Symptomatic but completely ambulatory  Blood pressure 133/64, pulse 64, temperature 98 F (36.7 C), temperature source Oral, resp. rate 18, weight 125 lb 9.6 oz (57 kg), SpO2 94 %.  LABORATORY DATA: Lab Results  Component Value Date   WBC 5.6 02/13/2021   HGB 11.9 (L) 02/13/2021   HCT 34.6 (L) 02/13/2021   MCV 94.3 02/13/2021   PLT 205 02/13/2021      Chemistry       Component Value Date/Time   NA 139 02/07/2021 1250   NA 135 10/29/2017 1131   K 3.4 (L) 02/07/2021 1250   CL 98 02/07/2021 1250   CO2 31 02/07/2021 1250   BUN 9 02/07/2021 1250   BUN 5 (L) 10/29/2017 1131   CREATININE 0.77 02/07/2021 1250      Component Value Date/Time   CALCIUM 9.8 02/07/2021 1250   ALKPHOS 84 02/07/2021 1250   AST 89 (H) 02/07/2021 1250   ALT 83 (H) 02/07/2021 1250   BILITOT 0.4 02/07/2021 1250       RADIOGRAPHIC STUDIES: No results found.  ASSESSMENT AND PLAN: This is a very pleasant 70 years old white female recently diagnosed with a stage IIIa (T1b, N2, M0) non-small cell lung cancer, adenocarcinoma presented with right upper lobe lung nodule in addition to mediastinal lymphadenopathy. The patient had MRI of the brain performed recently that showed no evidence of intracranial metastatic disease. The patient underwent a course of neoadjuvant treatment with carboplatin for AUC of 5, Alimta 500 mg/M2 and Keytruda 200 mg IV every 3 weeks for 3 cycles.   She  tolerated this treatment well with no concerning adverse effect except for dry skin and itching. Her repeat CT scan after the neoadjuvant treatment showed partial response with around 50% reduction in the tumor volume. The patient underwent right upper lobectomy with lymph node dissection on October 19, 2020 and the final pathology showed residual tumor measuring 0.5 cm with lymph node metastasis involving hilar and mediastinal lymph nodes. She is currently undergoing adjuvant systemic chemotherapy with carboplatin for AUC of 5, Alimta 500 mg/M2 every 3 weeks since she has good response to this treatment in the past.  She is status post 3 cycles. The patient continues to tolerate her treatment fairly well with no significant adverse effect except for mild fatigue. I recommended for her to proceed with cycle #4 today as planned. I will see her back for follow-up visit in 1 month for evaluation and repeat CT  scan of the chest for restaging of her disease. I will also ask the pathology department to send her tissue block for molecular studies and PD-L1 expression. The patient was advised to call immediately if she has any concerning symptoms in the interval. The patient voices understanding of current disease status and treatment options and is in agreement with the current care plan.  All questions were answered. The patient knows to call the clinic with any problems, questions or concerns. We can certainly see the patient much sooner if necessary.   Disclaimer: This note was dictated with voice recognition software. Similar sounding words can inadvertently be transcribed and may not be corrected upon review.

## 2021-02-13 NOTE — Patient Instructions (Signed)
[facility]  Discharge Instructions: Thank you for choosing [facility] to provide your oncology and hematology care.   If you have a lab appointment with the [facility], please go directly to the [facility] and check in at the registration area.   Wear comfortable clothing and clothing appropriate for easy access to any Portacath or PICC line.   We strive to give you quality time with your provider. You may need to reschedule your appointment if you arrive late (15 or more minutes).  Arriving late affects you and other patients whose appointments are after yours.  Also, if you miss three or more appointments without notifying the office, you may be dismissed from the clinic at the provider's discretion.      For prescription refill requests, have your pharmacy contact our office and allow 72 hours for refills to be completed.    Today you received the following chemotherapy and/or immunotherapy agents: Alimta, Carboplatin.       To help prevent nausea and vomiting after your treatment, we encourage you to take your nausea medication as directed.  BELOW ARE SYMPTOMS THAT SHOULD BE REPORTED IMMEDIATELY: . *FEVER GREATER THAN 100.4 F (38 C) OR HIGHER . *CHILLS OR SWEATING . *NAUSEA AND VOMITING THAT IS NOT CONTROLLED WITH YOUR NAUSEA MEDICATION . *UNUSUAL SHORTNESS OF BREATH . *UNUSUAL BRUISING OR BLEEDING . *URINARY PROBLEMS (pain or burning when urinating, or frequent urination) . *BOWEL PROBLEMS (unusual diarrhea, constipation, pain near the anus) . TENDERNESS IN MOUTH AND THROAT WITH OR WITHOUT PRESENCE OF ULCERS (sore throat, sores in mouth, or a toothache) . UNUSUAL RASH, SWELLING OR PAIN  . UNUSUAL VAGINAL DISCHARGE OR ITCHING   Items with * indicate a potential emergency and should be followed up as soon as possible or go to the Emergency Department if any problems should occur.  Please show the CHEMOTHERAPY ALERT CARD or  IMMUNOTHERAPY ALERT CARD at check-in to the Emergency Department and triage nurse.  Should you have questions after your visit or need to cancel or reschedule your appointment, please contact [facility]  Dept: [phone]  and follow the prompts.  Office hours are 8:00 a.m. to 4:30 p.m. Monday - Friday. Please note that voicemails left after 4:00 p.m. may not be returned until the following business day.  We are closed weekends and major holidays. You have access to a nurse at all times for urgent questions. Please call the main number to the clinic Dept: [phone] and follow the prompts.   For any non-urgent questions, you may also contact your provider using MyChart. We now offer e-Visits for anyone 71 and older to request care online for non-urgent symptoms. For details visit [email or web address].   Also download the MyChart app! Go to the app store, search "MyChart", open the app, select [facility], and log in with your MyChart username and password.  Due to Covid, a mask is required upon entering the hospital/clinic. If you do not have a mask, one will be given to you upon arrival. For doctor visits, patients may have 1 support person aged 71 or older with them. For treatment visits, patients cannot have anyone with them due to current Covid guidelines and our immunocompromised population.

## 2021-02-13 NOTE — Progress Notes (Signed)
Per Dr. Julien Nordmann, I updated pathology dept of his request for PDL 1.

## 2021-02-15 ENCOUNTER — Telehealth: Payer: Self-pay | Admitting: Internal Medicine

## 2021-02-15 NOTE — Telephone Encounter (Signed)
Scheduled per los. Called and left msg. Mailed printout  °

## 2021-02-20 ENCOUNTER — Other Ambulatory Visit: Payer: Self-pay

## 2021-02-20 ENCOUNTER — Inpatient Hospital Stay: Payer: Medicare Other | Attending: Physician Assistant

## 2021-02-20 ENCOUNTER — Ambulatory Visit: Payer: Medicare Other | Admitting: Thoracic Surgery (Cardiothoracic Vascular Surgery)

## 2021-02-20 DIAGNOSIS — R5383 Other fatigue: Secondary | ICD-10-CM | POA: Insufficient documentation

## 2021-02-20 DIAGNOSIS — Z9049 Acquired absence of other specified parts of digestive tract: Secondary | ICD-10-CM | POA: Insufficient documentation

## 2021-02-20 DIAGNOSIS — C3411 Malignant neoplasm of upper lobe, right bronchus or lung: Secondary | ICD-10-CM | POA: Insufficient documentation

## 2021-02-20 DIAGNOSIS — R079 Chest pain, unspecified: Secondary | ICD-10-CM | POA: Insufficient documentation

## 2021-02-20 DIAGNOSIS — Z79899 Other long term (current) drug therapy: Secondary | ICD-10-CM | POA: Diagnosis not present

## 2021-02-20 DIAGNOSIS — C771 Secondary and unspecified malignant neoplasm of intrathoracic lymph nodes: Secondary | ICD-10-CM | POA: Insufficient documentation

## 2021-02-20 LAB — CMP (CANCER CENTER ONLY)
ALT: 165 U/L — ABNORMAL HIGH (ref 0–44)
AST: 117 U/L — ABNORMAL HIGH (ref 15–41)
Albumin: 4.3 g/dL (ref 3.5–5.0)
Alkaline Phosphatase: 91 U/L (ref 38–126)
Anion gap: 10 (ref 5–15)
BUN: 11 mg/dL (ref 8–23)
CO2: 29 mmol/L (ref 22–32)
Calcium: 9.6 mg/dL (ref 8.9–10.3)
Chloride: 97 mmol/L — ABNORMAL LOW (ref 98–111)
Creatinine: 0.86 mg/dL (ref 0.44–1.00)
GFR, Estimated: >60 mL/min (ref >60)
Glucose, Bld: 90 mg/dL (ref 70–99)
Potassium: 3.7 mmol/L (ref 3.5–5.1)
Sodium: 136 mmol/L (ref 135–145)
Total Bilirubin: 0.8 mg/dL (ref 0.3–1.2)
Total Protein: 7 g/dL (ref 6.5–8.1)

## 2021-02-20 LAB — CBC WITH DIFFERENTIAL (CANCER CENTER ONLY)
Abs Immature Granulocytes: 0.02 10*3/uL (ref 0.00–0.07)
Basophils Absolute: 0 10*3/uL (ref 0.0–0.1)
Basophils Relative: 1 %
Eosinophils Absolute: 0 10*3/uL (ref 0.0–0.5)
Eosinophils Relative: 0 %
HCT: 35.2 % — ABNORMAL LOW (ref 36.0–46.0)
Hemoglobin: 11.9 g/dL — ABNORMAL LOW (ref 12.0–15.0)
Immature Granulocytes: 1 %
Lymphocytes Relative: 46 %
Lymphs Abs: 1.6 10*3/uL (ref 0.7–4.0)
MCH: 32.9 pg (ref 26.0–34.0)
MCHC: 33.8 g/dL (ref 30.0–36.0)
MCV: 97.2 fL (ref 80.0–100.0)
Monocytes Absolute: 0.2 10*3/uL (ref 0.1–1.0)
Monocytes Relative: 5 %
Neutro Abs: 1.6 10*3/uL — ABNORMAL LOW (ref 1.7–7.7)
Neutrophils Relative %: 47 %
Platelet Count: 138 10*3/uL — ABNORMAL LOW (ref 150–400)
RBC: 3.62 MIL/uL — ABNORMAL LOW (ref 3.87–5.11)
RDW: 16.7 % — ABNORMAL HIGH (ref 11.5–15.5)
WBC Count: 3.4 10*3/uL — ABNORMAL LOW (ref 4.0–10.5)
nRBC: 0 % (ref 0.0–0.2)

## 2021-02-27 ENCOUNTER — Inpatient Hospital Stay: Payer: Medicare Other

## 2021-02-27 ENCOUNTER — Other Ambulatory Visit: Payer: Self-pay

## 2021-02-27 DIAGNOSIS — C3411 Malignant neoplasm of upper lobe, right bronchus or lung: Secondary | ICD-10-CM | POA: Diagnosis not present

## 2021-02-27 DIAGNOSIS — C3490 Malignant neoplasm of unspecified part of unspecified bronchus or lung: Secondary | ICD-10-CM

## 2021-02-27 LAB — CMP (CANCER CENTER ONLY)
ALT: 98 U/L — ABNORMAL HIGH (ref 0–44)
AST: 71 U/L — ABNORMAL HIGH (ref 15–41)
Albumin: 4.2 g/dL (ref 3.5–5.0)
Alkaline Phosphatase: 93 U/L (ref 38–126)
Anion gap: 10 (ref 5–15)
BUN: 12 mg/dL (ref 8–23)
CO2: 29 mmol/L (ref 22–32)
Calcium: 8.9 mg/dL (ref 8.9–10.3)
Chloride: 101 mmol/L (ref 98–111)
Creatinine: 0.82 mg/dL (ref 0.44–1.00)
GFR, Estimated: >60 mL/min (ref >60)
Glucose, Bld: 91 mg/dL (ref 70–99)
Potassium: 3.6 mmol/L (ref 3.5–5.1)
Sodium: 140 mmol/L (ref 135–145)
Total Bilirubin: 0.6 mg/dL (ref 0.3–1.2)
Total Protein: 6.9 g/dL (ref 6.5–8.1)

## 2021-02-27 LAB — CBC WITH DIFFERENTIAL (CANCER CENTER ONLY)
Abs Immature Granulocytes: 0.02 K/uL (ref 0.00–0.07)
Basophils Absolute: 0 K/uL (ref 0.0–0.1)
Basophils Relative: 0 %
Eosinophils Absolute: 0 K/uL (ref 0.0–0.5)
Eosinophils Relative: 0 %
HCT: 30.3 % — ABNORMAL LOW (ref 36.0–46.0)
Hemoglobin: 10.3 g/dL — ABNORMAL LOW (ref 12.0–15.0)
Immature Granulocytes: 0 %
Lymphocytes Relative: 30 %
Lymphs Abs: 1.5 K/uL (ref 0.7–4.0)
MCH: 33.1 pg (ref 26.0–34.0)
MCHC: 34 g/dL (ref 30.0–36.0)
MCV: 97.4 fL (ref 80.0–100.0)
Monocytes Absolute: 0.6 K/uL (ref 0.1–1.0)
Monocytes Relative: 12 %
Neutro Abs: 2.9 K/uL (ref 1.7–7.7)
Neutrophils Relative %: 58 %
Platelet Count: 74 K/uL — ABNORMAL LOW (ref 150–400)
RBC: 3.11 MIL/uL — ABNORMAL LOW (ref 3.87–5.11)
RDW: 16.9 % — ABNORMAL HIGH (ref 11.5–15.5)
WBC Count: 4.9 K/uL (ref 4.0–10.5)
nRBC: 0 % (ref 0.0–0.2)

## 2021-03-05 ENCOUNTER — Other Ambulatory Visit: Payer: Self-pay

## 2021-03-05 DIAGNOSIS — C3411 Malignant neoplasm of upper lobe, right bronchus or lung: Secondary | ICD-10-CM

## 2021-03-06 ENCOUNTER — Other Ambulatory Visit: Payer: Self-pay

## 2021-03-06 ENCOUNTER — Ambulatory Visit: Payer: Medicare Other | Admitting: Thoracic Surgery (Cardiothoracic Vascular Surgery)

## 2021-03-06 ENCOUNTER — Inpatient Hospital Stay: Payer: Medicare Other

## 2021-03-06 DIAGNOSIS — C3411 Malignant neoplasm of upper lobe, right bronchus or lung: Secondary | ICD-10-CM

## 2021-03-06 LAB — CMP (CANCER CENTER ONLY)
ALT: 121 U/L — ABNORMAL HIGH (ref 0–44)
AST: 106 U/L — ABNORMAL HIGH (ref 15–41)
Albumin: 4.4 g/dL (ref 3.5–5.0)
Alkaline Phosphatase: 74 U/L (ref 38–126)
Anion gap: 7 (ref 5–15)
BUN: 10 mg/dL (ref 8–23)
CO2: 29 mmol/L (ref 22–32)
Calcium: 9.1 mg/dL (ref 8.9–10.3)
Chloride: 103 mmol/L (ref 98–111)
Creatinine: 0.81 mg/dL (ref 0.44–1.00)
GFR, Estimated: >60 mL/min (ref >60)
Glucose, Bld: 79 mg/dL (ref 70–99)
Potassium: 3.4 mmol/L — ABNORMAL LOW (ref 3.5–5.1)
Sodium: 139 mmol/L (ref 135–145)
Total Bilirubin: 0.5 mg/dL (ref 0.3–1.2)
Total Protein: 6.9 g/dL (ref 6.5–8.1)

## 2021-03-06 LAB — CBC WITH DIFFERENTIAL (CANCER CENTER ONLY)
Abs Immature Granulocytes: 0.06 10*3/uL (ref 0.00–0.07)
Basophils Absolute: 0 10*3/uL (ref 0.0–0.1)
Basophils Relative: 1 %
Eosinophils Absolute: 0 10*3/uL (ref 0.0–0.5)
Eosinophils Relative: 1 %
HCT: 28.2 % — ABNORMAL LOW (ref 36.0–46.0)
Hemoglobin: 9.7 g/dL — ABNORMAL LOW (ref 12.0–15.0)
Immature Granulocytes: 2 %
Lymphocytes Relative: 36 %
Lymphs Abs: 1.4 10*3/uL (ref 0.7–4.0)
MCH: 33.4 pg (ref 26.0–34.0)
MCHC: 34.4 g/dL (ref 30.0–36.0)
MCV: 97.2 fL (ref 80.0–100.0)
Monocytes Absolute: 0.5 10*3/uL (ref 0.1–1.0)
Monocytes Relative: 12 %
Neutro Abs: 2 10*3/uL (ref 1.7–7.7)
Neutrophils Relative %: 48 %
Platelet Count: 172 10*3/uL (ref 150–400)
RBC: 2.9 MIL/uL — ABNORMAL LOW (ref 3.87–5.11)
RDW: 17.8 % — ABNORMAL HIGH (ref 11.5–15.5)
WBC Count: 4 10*3/uL (ref 4.0–10.5)
nRBC: 0 % (ref 0.0–0.2)

## 2021-03-13 ENCOUNTER — Other Ambulatory Visit: Payer: Self-pay | Admitting: Medical Oncology

## 2021-03-13 ENCOUNTER — Ambulatory Visit (HOSPITAL_COMMUNITY)
Admission: RE | Admit: 2021-03-13 | Discharge: 2021-03-13 | Disposition: A | Discharge status: Home / Self Care | Payer: Medicare Other | Source: Ambulatory Visit | Attending: Internal Medicine | Admitting: Internal Medicine

## 2021-03-13 ENCOUNTER — Other Ambulatory Visit: Payer: Self-pay

## 2021-03-13 DIAGNOSIS — C3411 Malignant neoplasm of upper lobe, right bronchus or lung: Secondary | ICD-10-CM

## 2021-03-13 DIAGNOSIS — C3490 Malignant neoplasm of unspecified part of unspecified bronchus or lung: Secondary | ICD-10-CM

## 2021-03-13 IMAGING — CT CT CHEST W/ CM
2 of 4 series · 15 of 36 positions shown, 18 images · IV contrast (omnipaque)
Comparison: Most recent CT chest [DATE].

CLINICAL DATA: Primary Cancer Type: Lung
TECHNIQUE: Multidetector CT imaging of the chest was performed during
intravenous contrast administration.

CONTRAST:  75mL OMNIPAQUE IOHEXOL 300 MG/ML  SOLN

[Series 2: axial st · axial · 0.62mm/px · z∈[+1313,+1567]mm · 12 of 151 slices shown, 15 images]
[im 12/151  mediastinal]
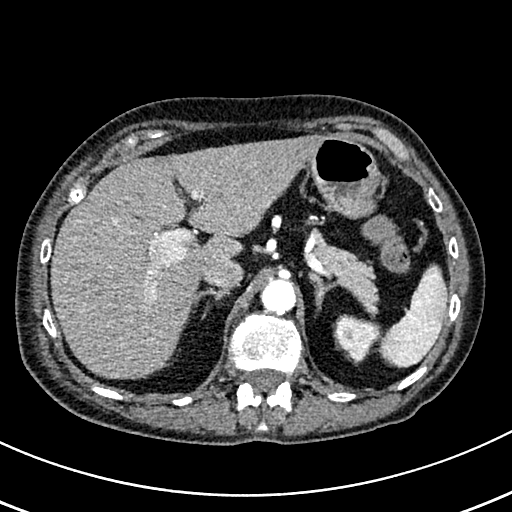
[im 12/151  lung]
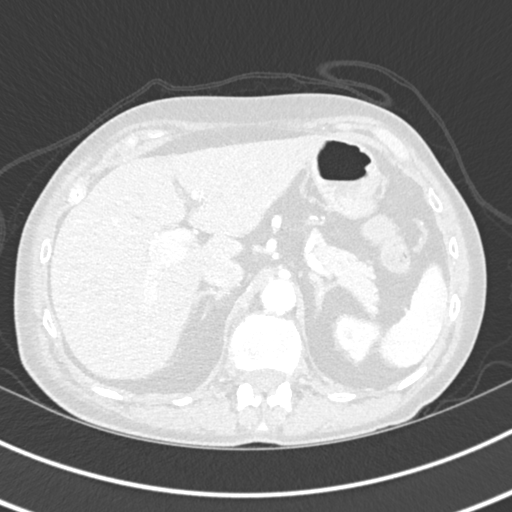
[im 24/151  lung]
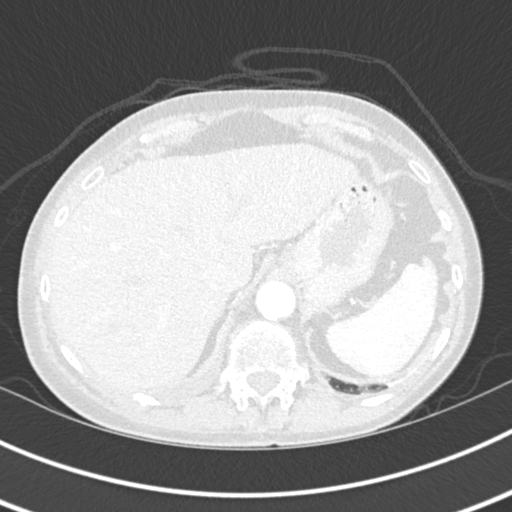
[im 35/151  lung]
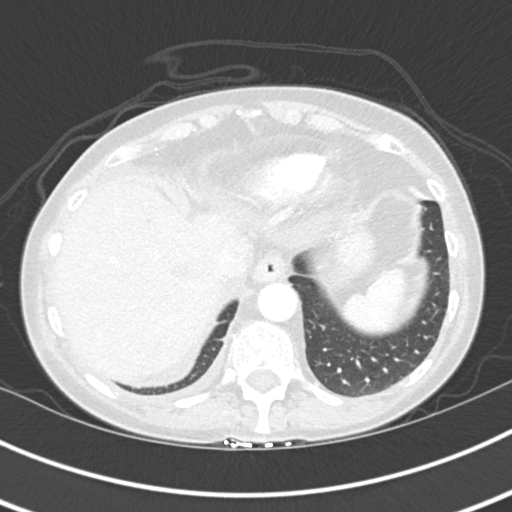
[im 47/151  lung]
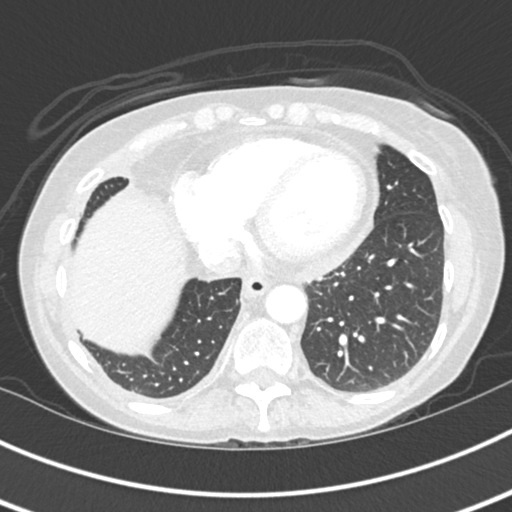
[im 58/151  mediastinal]
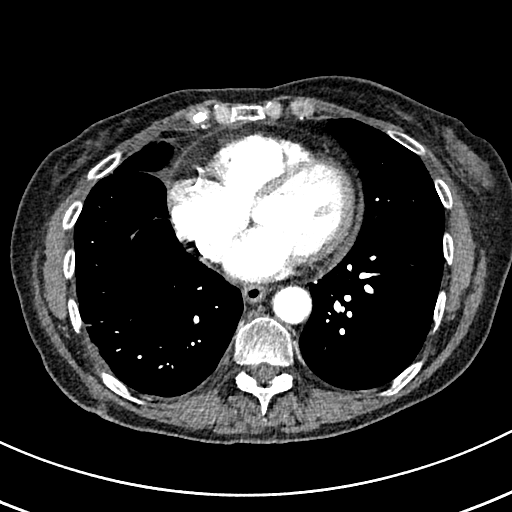
[im 58/151  lung]
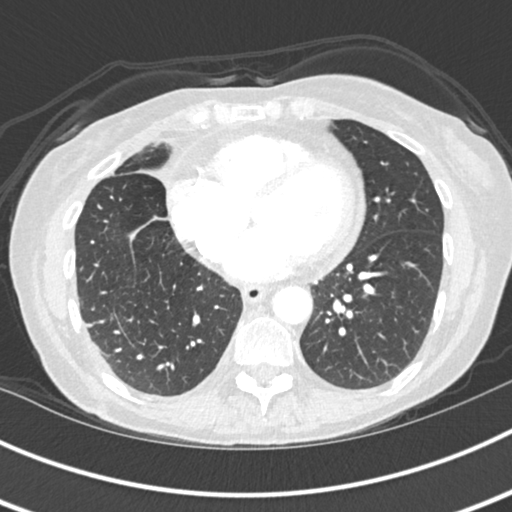
[im 70/151  lung]
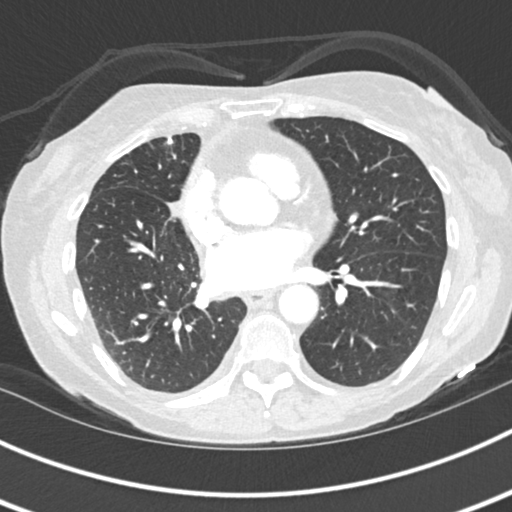
[im 81/151  lung]
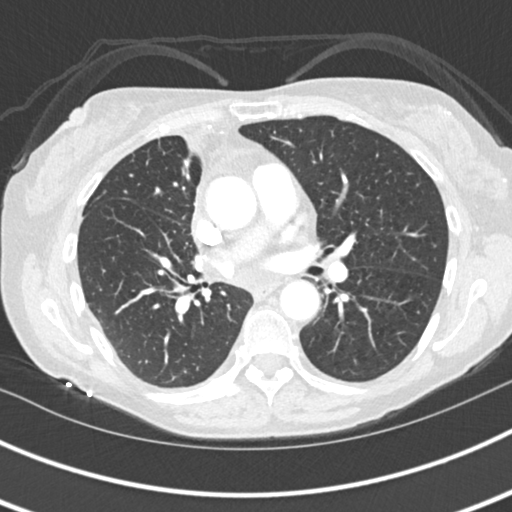
[im 93/151  lung]
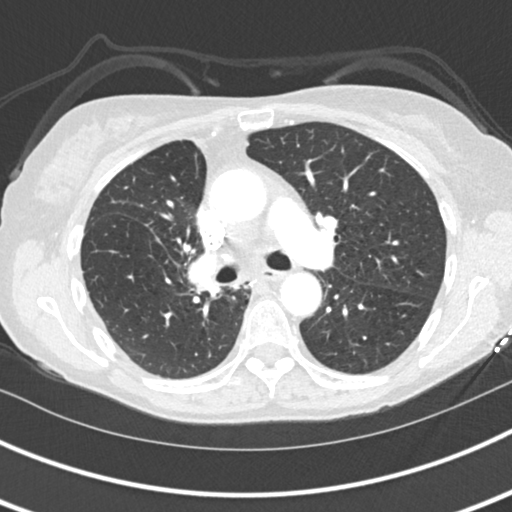
[im 104/151  mediastinal]
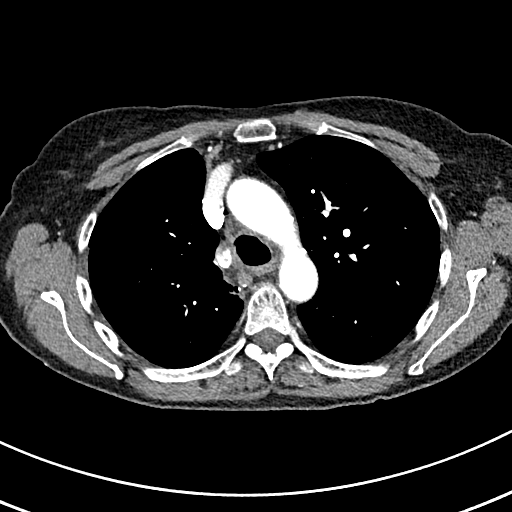
[im 104/151  lung]
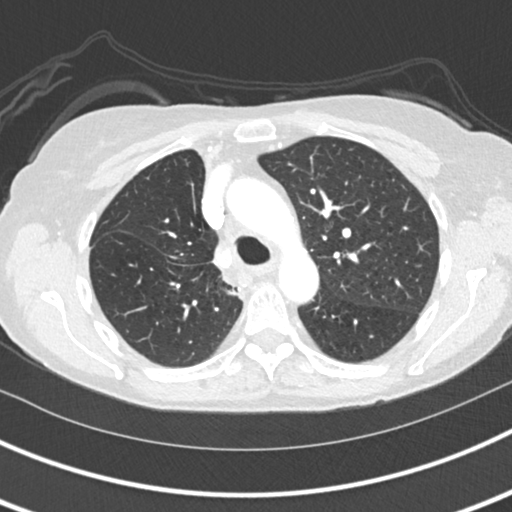
[im 116/151  lung]
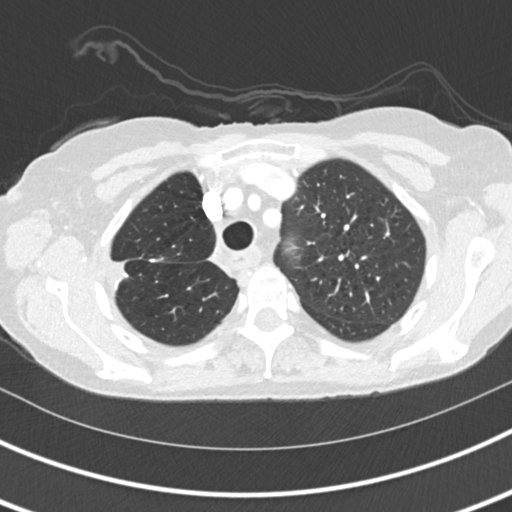
[im 127/151  lung]
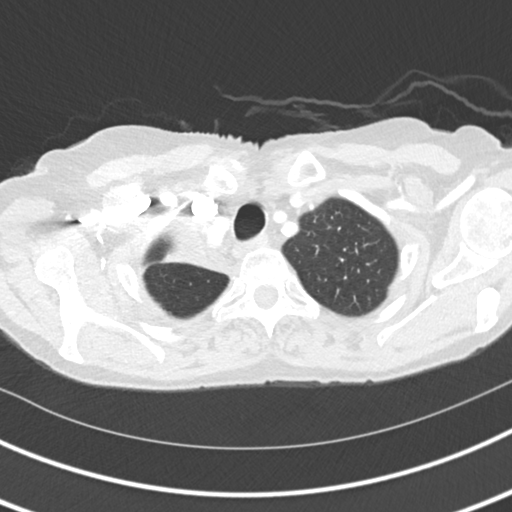
[im 139/151  lung]
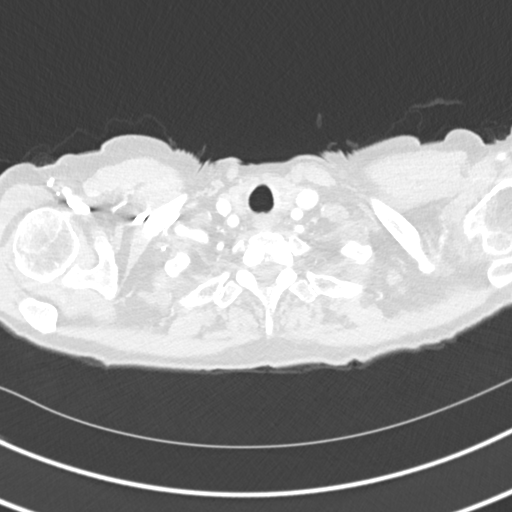

[Series 6: coronal · coronal · 0.62mm/px · 3 of 120 slices shown]
[im 24/120  lung]
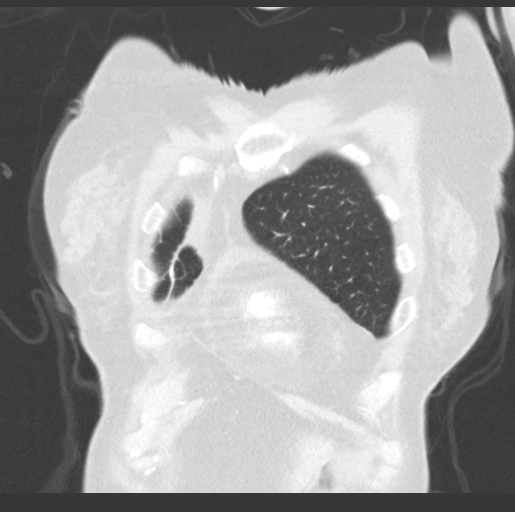
[im 48/120  lung]
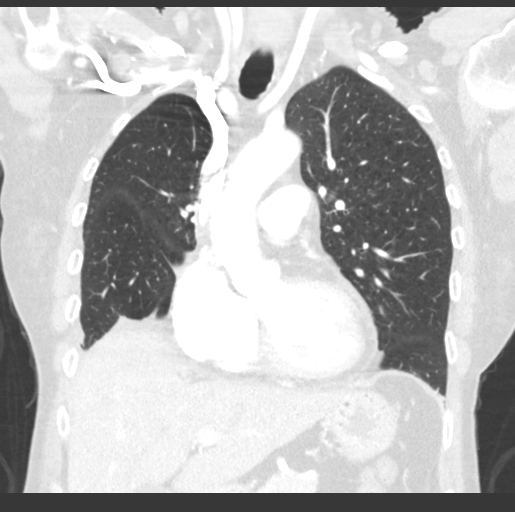
[im 72/120  lung]
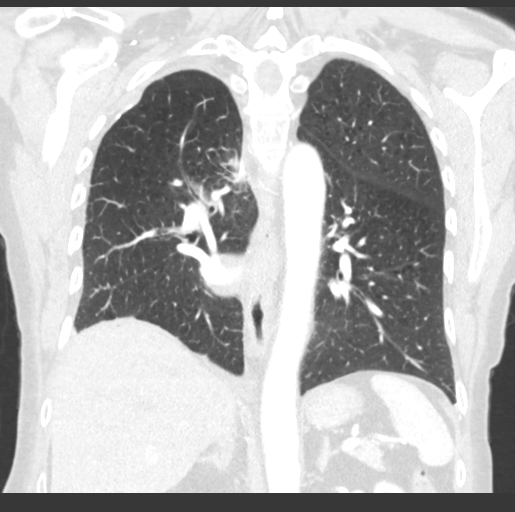

[15 of 36 positions shown; findings below may reference images not displayed]

Imaging Indication: Assess response to therapy

Interval therapy since last imaging? Yes

Initial Cancer Diagnosis

Date: [DATE]; Established by: Biopsy-proven

Detailed Pathology: Stage IIIA non-small cell lung cancer,
adenocarcinoma.

Primary Tumor location: Right upper lobe.

Surgeries: Right upper lobectomy [DATE].

Chemotherapy: Yes; Ongoing? Yes; Most recent administration:
[DATE]

Immunotherapy?  Yes; Type: Keytruda; Ongoing? No

Radiation therapy? No

EXAM:
CT CHEST WITH CONTRAST
FINDINGS: Cardiovascular: New tiny pericardial effusion versus pericardial
thickening. Aortic and coronary atherosclerotic calcification noted.

Mediastinum/Nodes: There has been resolution of previously seen mild
right paratracheal lymphadenopathy since prior exam. No masses or
pathologically enlarged lymph nodes identified.

Lungs/Pleura: Patient has undergone right upper lobectomy since
previous study. Mild right-sided pleural-parenchymal scarring is
seen as well as a tiny right pleural effusion versus pleural
thickening. No suspicious pulmonary nodules or masses are
identified.

Upper Abdomen:  Unremarkable.

Musculoskeletal:  No suspicious bone lesions.
IMPRESSION: Interval right upper lobectomy. No evidence of recurrent or
metastatic carcinoma within the thorax. No active disease.

Aortic Atherosclerosis ([MG]-[MG]).

## 2021-03-13 MED ORDER — SODIUM CHLORIDE (PF) 0.9 % IJ SOLN
INTRAMUSCULAR | Status: AC
  Filled 2021-03-13: qty 50

## 2021-03-13 MED ORDER — IOHEXOL 300 MG/ML  SOLN
75.0000 mL | Freq: Once | INTRAMUSCULAR | Status: AC | PRN
  Administered 2021-03-13: 75 mL via INTRAVENOUS

## 2021-03-14 ENCOUNTER — Inpatient Hospital Stay (HOSPITAL_BASED_OUTPATIENT_CLINIC_OR_DEPARTMENT_OTHER): Payer: Medicare Other | Admitting: Internal Medicine

## 2021-03-14 ENCOUNTER — Inpatient Hospital Stay: Payer: Medicare Other

## 2021-03-14 VITALS — BP 103/63 | HR 65 | Temp 96.3°F | Resp 18 | Ht 63.0 in | Wt 123.4 lb

## 2021-03-14 DIAGNOSIS — Z5112 Encounter for antineoplastic immunotherapy: Secondary | ICD-10-CM

## 2021-03-14 DIAGNOSIS — C3411 Malignant neoplasm of upper lobe, right bronchus or lung: Secondary | ICD-10-CM

## 2021-03-14 LAB — CBC WITH DIFFERENTIAL (CANCER CENTER ONLY)
Abs Immature Granulocytes: 0.04 10*3/uL (ref 0.00–0.07)
Basophils Absolute: 0 10*3/uL (ref 0.0–0.1)
Basophils Relative: 1 %
Eosinophils Absolute: 0 10*3/uL (ref 0.0–0.5)
Eosinophils Relative: 0 %
HCT: 30.2 % — ABNORMAL LOW (ref 36.0–46.0)
Hemoglobin: 10 g/dL — ABNORMAL LOW (ref 12.0–15.0)
Immature Granulocytes: 1 %
Lymphocytes Relative: 29 %
Lymphs Abs: 1.4 10*3/uL (ref 0.7–4.0)
MCH: 33.2 pg (ref 26.0–34.0)
MCHC: 33.1 g/dL (ref 30.0–36.0)
MCV: 100.3 fL — ABNORMAL HIGH (ref 80.0–100.0)
Monocytes Absolute: 0.5 10*3/uL (ref 0.1–1.0)
Monocytes Relative: 11 %
Neutro Abs: 2.9 10*3/uL (ref 1.7–7.7)
Neutrophils Relative %: 58 %
Platelet Count: 156 10*3/uL (ref 150–400)
RBC: 3.01 MIL/uL — ABNORMAL LOW (ref 3.87–5.11)
RDW: 18.4 % — ABNORMAL HIGH (ref 11.5–15.5)
WBC Count: 4.9 10*3/uL (ref 4.0–10.5)
nRBC: 0 % (ref 0.0–0.2)

## 2021-03-14 LAB — CMP (CANCER CENTER ONLY)
ALT: 113 U/L — ABNORMAL HIGH (ref 0–44)
AST: 104 U/L — ABNORMAL HIGH (ref 15–41)
Albumin: 4.2 g/dL (ref 3.5–5.0)
Alkaline Phosphatase: 80 U/L (ref 38–126)
Anion gap: 11 (ref 5–15)
BUN: 10 mg/dL (ref 8–23)
CO2: 30 mmol/L (ref 22–32)
Calcium: 9.7 mg/dL (ref 8.9–10.3)
Chloride: 102 mmol/L (ref 98–111)
Creatinine: 0.82 mg/dL (ref 0.44–1.00)
GFR, Estimated: >60 mL/min (ref >60)
Glucose, Bld: 79 mg/dL (ref 70–99)
Potassium: 3.5 mmol/L (ref 3.5–5.1)
Sodium: 143 mmol/L (ref 135–145)
Total Bilirubin: 0.5 mg/dL (ref 0.3–1.2)
Total Protein: 6.9 g/dL (ref 6.5–8.1)

## 2021-03-14 NOTE — Progress Notes (Signed)
[facility] [phone]   [phone]  OFFICE PROGRESS NOTE  [name], [address] [place] [address]  DIAGNOSIS: Stage IIIA (T1b, N2, M0) non-small cell lung cancer, adenocarcinoma presented with right upper lobe lung nodule as well as right upper paratracheal adenopathy and suspicious tiny nodule in the right middle lobe. The patient has PD-L1 expression was 90%  PRIOR THERAPY:  1) Neoadjuvant systemic chemotherapy with carboplatin for AUC of 5, Alimta 500 mg/M2 and Keytruda 200 mg IV every 3 weeks.  First dose 05/30/2020. Status post 3 cycles. 2) status post robotic assisted right thoracoscopy with right upper lobectomy and lymph node dissection under the care of Dr. Roxan Hockey on October 19, 2020.  There was residual tumor measuring 0.5 cm with lymph node metastasis involving 4R, 12 and hilar lymph nodes. 3) Adjuvant systemic chemotherapy with carboplatin for AUC of 5 and Alimta 500 Mg/M2 every 3 weeks.  First dose was given December 11, 2020.  Status post 4 cycles.  Last dose of chemotherapy was given Feb 13, 2021.    CURRENT THERAPY: Adjuvant immunotherapy with atezolizumab 1200 Mg IV every 3 weeks.  First dose March 27, 2021.   INTERVAL HISTORY: [name] 70 y.o. female returns to the clinic today for follow-up visit accompanied by her husband.  The patient is feeling fine today with no concerning complaints except for mild fatigue and occasional pain on the right side of the chest from the surgical scar.  She denied having any shortness of breath, cough or hemoptysis.  She denied having any fever or chills.  She has no nausea, vomiting, diarrhea or constipation.  She denied having any headache or visual changes.  She tolerated the previous course of adjuvant systemic chemotherapy with carboplatin and Alimta fairly well.  The patient had repeat CT scan of the chest performed recently and she is here for evaluation  and discussion of her risk her results.   MEDICAL HISTORY: Past Medical History:  Diagnosis Date   Cataract    COPD (chronic obstructive pulmonary disease) (HCC)     ALLERGIES:  has No Known Allergies.  MEDICATIONS:  Current Outpatient Medications  Medication Sig Dispense Refill   albuterol (PROVENTIL HFA;VENTOLIN HFA) 108 (90 Base) MCG/ACT inhaler Inhale 2 puffs into the lungs every 4 (four) hours as needed for wheezing or shortness of breath (cough, shortness of breath or wheezing.). (Patient not taking: Reported on 02/13/2021) 1 Inhaler 1   Calcium Carbonate (CALCIUM 600 PO) Take 600 mg by mouth 2 (two) times daily.      Cholecalciferol (VITAMIN D3 PO) Take 2,000 Units by mouth daily.     dexamethasone (DECADRON) 4 MG tablet 1 tablet p.o. twice daily the day before, day of and day after chemotherapy every 3 weeks. 40 tablet 0   fluticasone (FLONASE) 50 MCG/ACT nasal spray Place 2 sprays into both nostrils daily. 16 g 12   Fluticasone-Salmeterol (ADVAIR) 250-50 MCG/DOSE AEPB Inhale 1 puff into the lungs 2 (two) times daily.      folic acid (FOLVITE) 1 MG tablet Take 1 tablet (1 mg total) by mouth daily. 30 tablet 4   guaiFENesin (MUCINEX) 600 MG 12 hr tablet Take 1 tablet (600 mg total) by mouth 2 (two) times daily as needed for cough or to loosen phlegm. (Patient not taking: Reported on 02/13/2021)     HYDROcodone-acetaminophen (NORCO/VICODIN) 5-325 MG tablet Take 1-2 tablets by mouth every 6 (six) hours as  needed.     Multiple Vitamin (MULTIVITAMIN) tablet Take 1 tablet by mouth daily.     pregabalin (LYRICA) 25 MG capsule Take 1 capsule (25 mg total) by mouth at bedtime for 3 days, THEN 1 capsule (25 mg total) 2 (two) times daily for 3 days, THEN 1 capsule (25 mg total) 3 (three) times daily. 81 capsule 1   prochlorperazine (COMPAZINE) 10 MG tablet Take 1 tablet (10 mg total) by mouth every 6 (six) hours as needed. (Patient not taking: Reported on 02/13/2021) 30 tablet 2   traZODone  (DESYREL) 50 MG tablet Take 25-50 mg by mouth at bedtime. (Patient not taking: Reported on 02/13/2021)     No current facility-administered medications for this visit.    SURGICAL HISTORY:  Past Surgical History:  Procedure Laterality Date   APPENDECTOMY  2008   COLON SURGERY  2008   colostomy-infection post op appendectomy   COLOSTOMY REVERSAL  2009   INTERCOSTAL NERVE BLOCK Right 10/19/2020   Procedure: INTERCOSTAL NERVE BLOCK;  Surgeon: [name], MD;  Location: [facility];  Service: Thoracic;  Laterality: Right;   NODE DISSECTION Right 10/19/2020   Procedure: NODE DISSECTION;  Surgeon: [name], MD;  Location: [facility];  Service: Thoracic;  Laterality: Right;   VIDEO BRONCHOSCOPY WITH ENDOBRONCHIAL NAVIGATION N/A 05/08/2020   Procedure: VIDEO BRONCHOSCOPY WITH ENDOBRONCHIAL NAVIGATION;  Surgeon: [name], MD;  Location: [facility];  Service: Thoracic;  Laterality: N/A;   VIDEO BRONCHOSCOPY WITH ENDOBRONCHIAL ULTRASOUND N/A 05/08/2020   Procedure: VIDEO BRONCHOSCOPY WITH ENDOBRONCHIAL ULTRASOUND;  Surgeon: [name], MD;  Location: [facility];  Service: Thoracic;  Laterality: N/A;    REVIEW OF SYSTEMS:  Constitutional: positive for fatigue Eyes: negative Ears, nose, mouth, throat, and face: negative Respiratory: positive for pleurisy/chest pain Cardiovascular: negative Gastrointestinal: negative Genitourinary:negative Integument/breast: negative Hematologic/lymphatic: negative Musculoskeletal:negative Neurological: negative Behavioral/Psych: negative Endocrine: negative Allergic/Immunologic: negative   PHYSICAL EXAMINATION: General appearance: alert, cooperative, fatigued, and no distress Head: Normocephalic, without obvious abnormality, atraumatic Neck: no adenopathy, no JVD, supple, symmetrical, trachea midline, and thyroid not enlarged, symmetric, no tenderness/mass/nodules Lymph nodes: Cervical, supraclavicular, and axillary nodes normal. Resp:  clear to auscultation bilaterally Back: symmetric, no curvature. ROM normal. No CVA tenderness. Cardio: regular rate and rhythm, S1, S2 normal, no murmur, click, rub or gallop GI: soft, non-tender; bowel sounds normal; no masses,  no organomegaly Extremities: extremities normal, atraumatic, no cyanosis or edema Neurologic: Alert and oriented X 3, normal strength and tone. Normal symmetric reflexes. Normal coordination and gait  ECOG PERFORMANCE STATUS: 1 - Symptomatic but completely ambulatory  Blood pressure 103/63, pulse 65, temperature (!) 96.3 F (35.7 C), temperature source Tympanic, resp. rate 18, height 5' 3"  (1.6 m), weight 123 lb 6.4 oz (56 kg), SpO2 94 %.  LABORATORY DATA: Lab Results  Component Value Date   WBC 4.9 03/14/2021   HGB 10.0 (L) 03/14/2021   HCT 30.2 (L) 03/14/2021   MCV 100.3 (H) 03/14/2021   PLT 156 03/14/2021      Chemistry      Component Value Date/Time   NA 139 03/06/2021 1149   NA 135 10/29/2017 1131   K 3.4 (L) 03/06/2021 1149   CL 103 03/06/2021 1149   CO2 29 03/06/2021 1149   BUN 10 03/06/2021 1149   BUN 5 (L) 10/29/2017 1131   CREATININE 0.81 03/06/2021 1149      Component Value Date/Time   CALCIUM 9.1 03/06/2021 1149   ALKPHOS 74 03/06/2021 1149   AST  106 (H) 03/06/2021 1149   ALT 121 (H) 03/06/2021 1149   BILITOT 0.5 03/06/2021 1149       RADIOGRAPHIC STUDIES: CT Chest W Contrast  Result Date: 03/13/2021 CLINICAL DATA:  Primary Cancer Type: Lung Imaging Indication: Assess response to therapy Interval therapy since last imaging? Yes Initial Cancer Diagnosis Date: 05/08/2020; Established by: Biopsy-proven Detailed Pathology: Stage IIIA non-small cell lung cancer, adenocarcinoma. Primary Tumor location: Right upper lobe. Surgeries: Right upper lobectomy 10/19/2020. Chemotherapy: Yes; Ongoing? Yes; Most recent administration: 02/13/2021 Immunotherapy?  Yes; Type: Keytruda; Ongoing? No Radiation therapy? No EXAM: CT CHEST WITH CONTRAST  TECHNIQUE: Multidetector CT imaging of the chest was performed during intravenous contrast administration. CONTRAST:  109m OMNIPAQUE IOHEXOL 300 MG/ML  SOLN COMPARISON:  Most recent CT chest 08/14/2020. FINDINGS: Cardiovascular: New tiny pericardial effusion versus pericardial thickening. Aortic and coronary atherosclerotic calcification noted. Mediastinum/Nodes: There has been resolution of previously seen mild right paratracheal lymphadenopathy since prior exam. No masses or pathologically enlarged lymph nodes identified. Lungs/Pleura: Patient has undergone right upper lobectomy since previous study. Mild right-sided pleural-parenchymal scarring is seen as well as a tiny right pleural effusion versus pleural thickening. No suspicious pulmonary nodules or masses are identified. Upper Abdomen:  Unremarkable. Musculoskeletal:  No suspicious bone lesions. IMPRESSION: Interval right upper lobectomy. No evidence of recurrent or metastatic carcinoma within the thorax. No active disease. Aortic Atherosclerosis (ICD10-I70.0). Electronically Signed   By: JMarlaine HindM.D.   On: 03/13/2021 11:49    ASSESSMENT AND PLAN: This is a very pleasant 70years old white female recently diagnosed with a stage IIIa (T1b, N2, M0) non-small cell lung cancer, adenocarcinoma presented with right upper lobe lung nodule in addition to mediastinal lymphadenopathy.  PD-L1 expression 90%. The patient had MRI of the brain performed recently that showed no evidence of intracranial metastatic disease. The patient underwent a course of neoadjuvant treatment with carboplatin for AUC of 5, Alimta 500 mg/M2 and Keytruda 200 mg IV every 3 weeks for 3 cycles.   She tolerated this treatment well with no concerning adverse effect except for dry skin and itching. Her repeat CT scan after the neoadjuvant treatment showed partial response with around 50% reduction in the tumor volume. The patient underwent right upper lobectomy with lymph node  dissection on October 19, 2020 and the final pathology showed residual tumor measuring 0.5 cm with lymph node metastasis involving hilar and mediastinal lymph nodes. She underwent adjuvant systemic chemotherapy with carboplatin for AUC of 5, Alimta 500 mg/M2 every 3 weeks since she has good response to this treatment in the past.  She is status post 4 cycles.  She tolerated this treatment well with no concerning adverse effect except for mild fatigue. She had repeat CT scan of the chest performed recently.  I personally and independently reviewed the scans and discussed the results with the patient today.  Her scan showed no concerning findings for disease recurrence or metastasis.   I discussed the scan results with the patient and her husband and give her the option of continuous observation and monitoring versus proceeding with adjuvant treatment with immunotherapy with Tecentriq 1200 Mg IV every 3 weeks for a total of 1 year unless the patient has disease progression or unacceptable toxicity. I discussed with the patient the adverse effect of this treatment including but not limited to immunotherapy mediated skin rash, diarrhea, inflammation of the lung, kidney, liver, thyroid or other endocrine dysfunction. The patient is interested in the treatment and expected to start the first  dose of this treatment on March 27, 2021. She will come back for follow-up visit in around 5 weeks with the start of cycle #2. The patient was advised to call immediately if she has any other concerning symptoms in the interval. The patient voices understanding of current disease status and treatment options and is in agreement with the current care plan.  All questions were answered. The patient knows to call the clinic with any problems, questions or concerns. We can certainly see the patient much sooner if necessary.   Disclaimer: This note was dictated with voice recognition software. Similar sounding words can  inadvertently be transcribed and may not be corrected upon review.

## 2021-03-14 NOTE — Progress Notes (Signed)
DISCONTINUE ON PATHWAY REGIMEN - Non-Small Cell Lung     A cycle is every 21 days:     Pemetrexed      Carboplatin   **Always confirm dose/schedule in your pharmacy ordering system**  REASON: Other Reason PRIOR TREATMENT: [id number]: Carboplatin AUC=5 + Pemetrexed 500 mg/m2 q21 Days x 4 Cycles TREATMENT RESPONSE: N/A - Adjuvant Therapy  START OFF PATHWAY REGIMEN - Non-Small Cell Lung   [id number]:Atezolizumab 1,200 mg IV D1 q21 Days:   A cycle is every 21 days:     Atezolizumab   **Always confirm dose/schedule in your pharmacy ordering system**  Patient Characteristics: Post-Neoadjuvant Therapy and Resection (Neoadjuvant Pathologic Staging), Stage III, Prior Neoadjuvant Chemotherapy Therapeutic Status: Post-Neoadjuvant Therapy and Resection (Neoadjuvant Pathologic Staging) AJCC T Category: ypT1b AJCC N Category: ypN2 AJCC M Category: cM0 AJCC 8 Stage Grouping: IIIA Intent of Therapy: Curative Intent, Discussed with Patient

## 2021-03-16 ENCOUNTER — Telehealth: Payer: Self-pay | Admitting: Internal Medicine

## 2021-03-16 NOTE — Telephone Encounter (Signed)
Scheduled per los. Called and spoke with patient. Confirmed appt 

## 2021-03-20 ENCOUNTER — Other Ambulatory Visit: Payer: Self-pay

## 2021-03-20 ENCOUNTER — Ambulatory Visit: Payer: Medicare Other | Admitting: Thoracic Surgery (Cardiothoracic Vascular Surgery)

## 2021-03-20 ENCOUNTER — Ambulatory Visit (INDEPENDENT_AMBULATORY_CARE_PROVIDER_SITE_OTHER): Payer: Medicare Other | Admitting: Physician Assistant

## 2021-03-20 DIAGNOSIS — Z9889 Other specified postprocedural states: Secondary | ICD-10-CM

## 2021-03-20 NOTE — Progress Notes (Signed)
[address]       [place] [address]             [phone]     [name] is a 70 y.o. female patient with a history of tobacco abuse and COPD.  She had a 50-pack-year history of smoking before quitting in January 2021.  She was found to have a right upper lobe lung nodule on a low-dose screening CT.  On PET CT the nodule was hypermetabolic and there was also a hypermetabolic right paratracheal node.  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  She was treated with neoadjuvant chemotherapy and immunotherapy and then underwent a robotic right upper lobectomy and node dissection.  Surgery was on 10/19/2020.  She went home with a chest tube in place.  I removed that in the office on 11/01/2020.   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 She was started on Lyrica at that time and encouraged to continue her Tramadol as needed. Last week she underwent a CT of the chest which is listed below. She returns for an office visit to review the study and follow-up in her neuropathic pain. She saw Dr. Julien Nordmann last week and adjuvant treatment with Tecentriq 1200mg  IV every 3 weeks for a total of 1 year starting July 12th. She will follow-up with their team in beginning of August.   Today, she still occasionally has some sharp pains around her incision but they are getting better and less frequent. She still has Lyrica that she is taking as needed as well as hydrocodone that was given to her by Dr. Arvilla Market office.    1. S/P robot-assisted surgical procedure    Past Medical History:  Diagnosis Date   Cataract    COPD (chronic obstructive pulmonary disease) ([facility])    No past surgical history pertinent negatives on file. Scheduled Meds: Current Outpatient Medications on File Prior to Visit  Medication Sig Dispense Refill   albuterol (PROVENTIL HFA;VENTOLIN HFA) 108 (90  Base) MCG/ACT inhaler Inhale 2 puffs into the lungs every 4 (four) hours as needed for wheezing or shortness of breath (cough, shortness of breath or wheezing.). 1 Inhaler 1   Calcium Carbonate (CALCIUM 600 PO) Take 600 mg by mouth 2 (two) times daily.      Cholecalciferol (VITAMIN D3 PO) Take 2,000 Units by mouth daily.     dexamethasone (DECADRON) 4 MG tablet 1 tablet p.o. twice daily the day before, day of and day after chemotherapy every 3 weeks. 40 tablet 0   fluticasone (FLONASE) 50 MCG/ACT nasal spray Place 2 sprays into both nostrils daily. 16 g 12   Fluticasone-Salmeterol (ADVAIR) 250-50 MCG/DOSE AEPB Inhale 1 puff into the lungs 2 (two) times daily.      folic acid (FOLVITE) 1 MG tablet Take 1 tablet (1 mg total) by mouth daily. 30 tablet 4   guaiFENesin (MUCINEX) 600 MG 12 hr tablet Take 1 tablet (600 mg total) by mouth 2 (two) times daily as needed for cough or to loosen phlegm.     HYDROcodone-acetaminophen (NORCO/VICODIN) 5-325 MG tablet Take 1-2 tablets by mouth every 6 (six) hours as needed.     Multiple Vitamin (MULTIVITAMIN) tablet Take 1 tablet by mouth daily.     prochlorperazine (COMPAZINE) 10 MG tablet Take 1 tablet (10 mg total) by mouth every 6 (six) hours as  needed. 30 tablet 2   traZODone (DESYREL) 50 MG tablet Take 25-50 mg by mouth at bedtime.     pregabalin (LYRICA) 25 MG capsule Take 1 capsule (25 mg total) by mouth at bedtime for 3 days, THEN 1 capsule (25 mg total) 2 (two) times daily for 3 days, THEN 1 capsule (25 mg total) 3 (three) times daily. 81 capsule 1   No current facility-administered medications on file prior to visit.     No Known Allergies Active Problems:   * No active hospital problems. *  Height 5\' 3"  (1.6 m).  CLINICAL DATA:  Primary Cancer Type: Lung   Imaging Indication: Assess response to therapy   Interval therapy since last imaging? Yes   Initial Cancer Diagnosis   Date: 05/08/2020; Established by: Biopsy-proven   Detailed  Pathology: Stage IIIA non-small cell lung cancer, adenocarcinoma.   Primary Tumor location: Right upper lobe.   Surgeries: Right upper lobectomy 10/19/2020.   Chemotherapy: Yes; Ongoing? Yes; Most recent administration: 02/13/2021   Immunotherapy?  Yes; Type: Keytruda; Ongoing? No   Radiation therapy? No   EXAM: CT CHEST WITH CONTRAST   TECHNIQUE: Multidetector CT imaging of the chest was performed during intravenous contrast administration.   CONTRAST:  35mL OMNIPAQUE IOHEXOL 300 MG/ML  SOLN   COMPARISON:  Most recent CT chest 08/14/2020.   FINDINGS: Cardiovascular: New tiny pericardial effusion versus pericardial thickening. Aortic and coronary atherosclerotic calcification noted.   Mediastinum/Nodes: There has been resolution of previously seen mild right paratracheal lymphadenopathy since prior exam. No masses or pathologically enlarged lymph nodes identified.   Lungs/Pleura: Patient has undergone right upper lobectomy since previous study. Mild right-sided pleural-parenchymal scarring is seen as well as a tiny right pleural effusion versus pleural thickening. No suspicious pulmonary nodules or masses are identified.   Upper Abdomen:  Unremarkable.   Musculoskeletal:  No suspicious bone lesions.   IMPRESSION: Interval right upper lobectomy. No evidence of recurrent or metastatic carcinoma within the thorax. No active disease.   Aortic Atherosclerosis (ICD10-I70.0).     Electronically Signed   By: Marlaine Hind M.D.   On: 03/13/2021 11:49  Cor: RRR,no murmur Pulm: clear to auscultation Abd: soft, nontender Wound: healed, no erythema Ext: no edema  Subjective Objective: Vital signs (most recent): Height 5\' 3"  (1.6 m). Assessment & Plan  stage IIIa (T1b, N2, M0) non-small cell lung cancer, adenocarcinoma presented with right upper lobe lung nodule in addition to mediastinal lymphadenopathy- Feb 3rd, 2022 she underwent a right upper lobectomy with lymph  node dissection showing a residual tumer measuring 0.5cm with lymph node metastasis. She has been followed by Dr. Earlie Server and is s/p 4 cycles of chemotherapy. Repeat CT scan reviewed with the patient showing no concerning findings for recurrence or metastasis.  Constipation-I recommended a Dulcolax suppository as needed for constipation.  She currently is taking MiraLAX and Colace orally but she states that occasionally she does not have a bowel movement for 4 to 5 days and its very uncomfortable.  They are also stronger medications for constipation for chemotherapy patients but I will refer her back to Dr. Earlie Server for a prescription. Intercostal pain-she occasionally continues to have sharp pains around her incisions but most of the time it is more of a dull aching pain.  She would really like to get off of the Lyrica and hydrocodone medications as quickly as possible.  She was trying Voltaren gel which helped some of the time but I have also recommended some lidocaine cream which  is available over-the-counter.  She is welcome to use heat or ice over her incisions what ever feels good and that she is fine to sit in a bath at this point since she is 5 months out from surgery.  She is encouraged to limit her hydrocodone use and this will also help her constipation.  I have suggested using Lyrica for the sharp nerve related pain over the hydrocodone. She is also noticed some hand swelling which could be due to numerous things.  Her salt intake is increased since her taste buds have not fully returned from her chemotherapy.  Also she does have some arthritis at baseline.  If this worsens she is encouraged to book an appointment with her primary care provider.  Plan: The patient wanted to come back in a month to follow-up on her intercostal rib pain.  We have scheduled an appointment with Dr. Roxan Hockey in 1 month.  She has several things to try for her pain in the meantime.  She is to follow-up with Dr.  Earlie Server at the beginning of next month and start her immunotherapy treatment.   Elgie Collard 03/20/2021

## 2021-03-27 ENCOUNTER — Other Ambulatory Visit: Payer: Self-pay | Admitting: Internal Medicine

## 2021-03-27 ENCOUNTER — Other Ambulatory Visit: Payer: Self-pay

## 2021-03-27 ENCOUNTER — Inpatient Hospital Stay: Payer: Medicare Other | Attending: Physician Assistant

## 2021-03-27 ENCOUNTER — Inpatient Hospital Stay (HOSPITAL_BASED_OUTPATIENT_CLINIC_OR_DEPARTMENT_OTHER): Payer: Medicare Other | Admitting: Medical

## 2021-03-27 VITALS — BP 130/65 | HR 77 | Temp 98.6°F | Resp 17 | Wt 123.0 lb

## 2021-03-27 DIAGNOSIS — Z5112 Encounter for antineoplastic immunotherapy: Secondary | ICD-10-CM | POA: Diagnosis not present

## 2021-03-27 DIAGNOSIS — Z79899 Other long term (current) drug therapy: Secondary | ICD-10-CM | POA: Diagnosis not present

## 2021-03-27 DIAGNOSIS — C3411 Malignant neoplasm of upper lobe, right bronchus or lung: Secondary | ICD-10-CM | POA: Diagnosis not present

## 2021-03-27 LAB — CMP (CANCER CENTER ONLY)
ALT: 66 U/L — ABNORMAL HIGH (ref 0–44)
AST: 95 U/L — ABNORMAL HIGH (ref 15–41)
Albumin: 4.1 g/dL (ref 3.5–5.0)
Alkaline Phosphatase: 65 U/L (ref 38–126)
Anion gap: 11 (ref 5–15)
BUN: 11 mg/dL (ref 8–23)
CO2: 27 mmol/L (ref 22–32)
Calcium: 9.8 mg/dL (ref 8.9–10.3)
Chloride: 103 mmol/L (ref 98–111)
Creatinine: 0.81 mg/dL (ref 0.44–1.00)
GFR, Estimated: >60 mL/min (ref >60)
Glucose, Bld: 76 mg/dL (ref 70–99)
Potassium: 3.8 mmol/L (ref 3.5–5.1)
Sodium: 141 mmol/L (ref 135–145)
Total Bilirubin: 0.5 mg/dL (ref 0.3–1.2)
Total Protein: 6.8 g/dL (ref 6.5–8.1)

## 2021-03-27 LAB — CBC WITH DIFFERENTIAL (CANCER CENTER ONLY)
Abs Immature Granulocytes: 0.02 10*3/uL (ref 0.00–0.07)
Basophils Absolute: 0 10*3/uL (ref 0.0–0.1)
Basophils Relative: 0 %
Eosinophils Absolute: 0 10*3/uL (ref 0.0–0.5)
Eosinophils Relative: 1 %
HCT: 28.4 % — ABNORMAL LOW (ref 36.0–46.0)
Hemoglobin: 9.5 g/dL — ABNORMAL LOW (ref 12.0–15.0)
Immature Granulocytes: 0 %
Lymphocytes Relative: 24 %
Lymphs Abs: 1.2 10*3/uL (ref 0.7–4.0)
MCH: 34.2 pg — ABNORMAL HIGH (ref 26.0–34.0)
MCHC: 33.5 g/dL (ref 30.0–36.0)
MCV: 102.2 fL — ABNORMAL HIGH (ref 80.0–100.0)
Monocytes Absolute: 0.4 10*3/uL (ref 0.1–1.0)
Monocytes Relative: 7 %
Neutro Abs: 3.4 10*3/uL (ref 1.7–7.7)
Neutrophils Relative %: 68 %
Platelet Count: 183 10*3/uL (ref 150–400)
RBC: 2.78 MIL/uL — ABNORMAL LOW (ref 3.87–5.11)
RDW: 18.6 % — ABNORMAL HIGH (ref 11.5–15.5)
WBC Count: 5 10*3/uL (ref 4.0–10.5)
nRBC: 0 % (ref 0.0–0.2)

## 2021-03-27 LAB — TSH: TSH: 65.218 u[IU]/mL — ABNORMAL HIGH (ref 0.308–3.960)

## 2021-03-27 MED ORDER — SODIUM CHLORIDE 0.9 % IV SOLN
1200.0000 mg | Freq: Once | INTRAVENOUS | Status: AC
  Administered 2021-03-27: 1200 mg via INTRAVENOUS
  Filled 2021-03-27: qty 20

## 2021-03-27 MED ORDER — SODIUM CHLORIDE 0.9 % IV SOLN
Freq: Once | INTRAVENOUS | Status: AC
Start: 2021-03-27 — End: 2021-03-27
  Filled 2021-03-27: qty 250

## 2021-03-27 MED ORDER — LEVOTHYROXINE SODIUM 50 MCG PO TABS: 50.0000 ug | ORAL_TABLET | Freq: Every day | ORAL | 1 refills | Status: DC

## 2021-03-27 NOTE — Patient Instructions (Signed)
[facility]  Discharge Instructions: Thank you for choosing [facility] to provide your oncology and hematology care.   If you have a lab appointment with the [facility], please go directly to the [facility] and check in at the registration area.   Wear comfortable clothing and clothing appropriate for easy access to any Portacath or PICC line.   We strive to give you quality time with your provider. You may need to reschedule your appointment if you arrive late (15 or more minutes).  Arriving late affects you and other patients whose appointments are after yours.  Also, if you miss three or more appointments without notifying the office, you may be dismissed from the clinic at the provider's discretion.      For prescription refill requests, have your pharmacy contact our office and allow 72 hours for refills to be completed.    Today you received the following chemotherapy and/or immunotherapy agents : tecentriq     To help prevent nausea and vomiting after your treatment, we encourage you to take your nausea medication as directed.  BELOW ARE SYMPTOMS THAT SHOULD BE REPORTED IMMEDIATELY: *FEVER GREATER THAN 100.4 F (38 C) OR HIGHER *CHILLS OR SWEATING *NAUSEA AND VOMITING THAT IS NOT CONTROLLED WITH YOUR NAUSEA MEDICATION *UNUSUAL SHORTNESS OF BREATH *UNUSUAL BRUISING OR BLEEDING *URINARY PROBLEMS (pain or burning when urinating, or frequent urination) *BOWEL PROBLEMS (unusual diarrhea, constipation, pain near the anus) TENDERNESS IN MOUTH AND THROAT WITH OR WITHOUT PRESENCE OF ULCERS (sore throat, sores in mouth, or a toothache) UNUSUAL RASH, SWELLING OR PAIN  UNUSUAL VAGINAL DISCHARGE OR ITCHING   Items with * indicate a potential emergency and should be followed up as soon as possible or go to the Emergency Department if any problems should occur.  Please show the CHEMOTHERAPY ALERT CARD or IMMUNOTHERAPY ALERT CARD at check-in to  the Emergency Department and triage nurse.  Should you have questions after your visit or need to cancel or reschedule your appointment, please contact [facility]  Dept: [phone]  and follow the prompts.  Office hours are 8:00 a.m. to 4:30 p.m. Monday - Friday. Please note that voicemails left after 4:00 p.m. may not be returned until the following business day.  We are closed weekends and major holidays. You have access to a nurse at all times for urgent questions. Please call the main number to the clinic Dept: [phone] and follow the prompts.   For any non-urgent questions, you may also contact your provider using MyChart. We now offer e-Visits for anyone 68 and older to request care online for non-urgent symptoms. For details visit [email or web address].   Also download the MyChart app! Go to the app store, search "MyChart", open the app, select [facility], and log in with your MyChart username and password.  Due to Covid, a mask is required upon entering the hospital/clinic. If you do not have a mask, one will be given to you upon arrival. For doctor visits, patients may have 1 support person aged 51 or older with them. For treatment visits, patients cannot have anyone with them due to current Covid guidelines and our immunocompromised population.   Atezolizumab injection What is this medication? ATEZOLIZUMAB (a te zoe LIZ ue mab) is a monoclonal antibody. It is used to treat bladder cancer (urothelial cancer), liver cancer, lung cancer, andmelanoma. This medicine may be used for other purposes; ask your health care provider orpharmacist if you have questions.  COMMON BRAND NAME(S): Tecentriq What should I tell my care team before I take this medication? They need to know if you have any of these conditions: autoimmune diseases like Crohn's disease, ulcerative colitis, or lupus have had or planning to have an allogeneic stem cell transplant (uses  someone else's stem cells) history of organ transplant history of radiation to the chest nervous system problems like myasthenia gravis or Guillain-Barre syndrome an unusual or allergic reaction to atezolizumab, other medicines, foods, dyes, or preservatives pregnant or trying to get pregnant breast-feeding How should I use this medication? This medicine is for infusion into a vein. It is given by a health careprofessional in a hospital or clinic setting. A special MedGuide will be given to you before each treatment. Be sure to readthis information carefully each time. Talk to your pediatrician regarding the use of this medicine in children.Special care may be needed. Overdosage: If you think you have taken too much of this medicine contact apoison control center or emergency room at once. NOTE: This medicine is only for you. Do not share this medicine with others. What if I miss a dose? It is important not to miss your dose. Call your doctor or health careprofessional if you are unable to keep an appointment. What may interact with this medication? Interactions have not been studied. This list may not describe all possible interactions. Give your health care provider a list of all the medicines, herbs, non-prescription drugs, or dietary supplements you use. Also tell them if you smoke, drink alcohol, or use illegaldrugs. Some items may interact with your medicine. What should I watch for while using this medication? Your condition will be monitored carefully while you are receiving thismedicine. You may need blood work done while you are taking this medicine. Do not become pregnant while taking this medicine or for at least 5 months after stopping it. Women should inform their doctor if they wish to become pregnant or think they might be pregnant. There is a potential for serious side effects to an unborn child. Talk to your health care professional or pharmacist for more information. Do not  breast-feed an infant while taking this medicineor for at least 5 months after the last dose. What side effects may I notice from receiving this medication? Side effects that you should report to your doctor or health care professionalas soon as possible: allergic reactions like skin rash, itching or hives, swelling of the face, lips, or tongue black, tarry stools bloody or watery diarrhea breathing problems changes in vision chest pain or chest tightness chills facial flushing fever headache signs and symptoms of high blood sugar such as dizziness; dry mouth; dry skin; fruity breath; nausea; stomach pain; increased hunger or thirst; increased urination signs and symptoms of liver injury like dark yellow or brown urine; general ill feeling or flu-like symptoms; light-colored stools; loss of appetite; nausea; right upper belly pain; unusually weak or tired; yellowing of the eyes or skin stomach pain trouble passing urine or change in the amount of urine Side effects that usually do not require medical attention (report to yourdoctor or health care professional if they continue or are bothersome): bone pain cough diarrhea joint pain muscle pain muscle weakness swelling of arms or legs tiredness weight loss This list may not describe all possible side effects. Call your doctor for medical advice about side effects. You may report side effects to FDA at1-800-FDA-1088. Where should I keep my medication? This drug is given in a hospital or clinic  and will not be stored at home. NOTE: This sheet is a summary. It may not cover all possible information. If you have questions about this medicine, talk to your doctor, pharmacist, orhealth care provider.  2022 Elsevier/Gold Standard (2020-06-01 13:59:34)

## 2021-03-28 ENCOUNTER — Encounter: Payer: Self-pay | Admitting: Internal Medicine

## 2021-03-28 ENCOUNTER — Telehealth: Payer: Self-pay

## 2021-03-28 NOTE — Telephone Encounter (Signed)
Called [name] per MD, informed him of labs, educated on what it means and how to take medication, Christina Cokley verbalized understanding for his wife, the pt.

## 2021-03-28 NOTE — Telephone Encounter (Signed)
-----   Message from Curt Bears, MD sent at 03/27/2021  6:09 PM EDT ----- Please let the patient know that I will start her on levothyroxine 50 mcg p.o. daily and will continue to monitor her TSH closely.  Thank you. ----- Message ----- From: Buel Ream, Lab In [place] Sent: 03/27/2021   1:32 PM EDT To: Curt Bears, MD

## 2021-03-28 NOTE — Progress Notes (Signed)
Received referral from RN and pharmacist regarding patient's spouse receiving text for discount with Tecentriq.  Called [name] and introduced myself and provided further information regarding contacting insurance regarding if ded/OOP have been met and if assistance may be needed. Advised we did not send out text message to my knowledge.   Advised he would be contacted from pharmacy if patient assistance is needed for excessive OOP. Message sent to Mickel Baas in pharmacy to review.  He has my contact name and number for any additional financial questions or concerns.

## 2021-04-17 ENCOUNTER — Ambulatory Visit: Payer: Medicare Other

## 2021-04-17 ENCOUNTER — Ambulatory Visit: Payer: Medicare Other | Admitting: Internal Medicine

## 2021-04-17 ENCOUNTER — Other Ambulatory Visit: Payer: Medicare Other

## 2021-04-17 ENCOUNTER — Telehealth: Payer: Self-pay

## 2021-04-17 NOTE — Telephone Encounter (Signed)
Pts husband called to advise pt is unable to make her appts today and wants to wait until her 05/07/21 appt. I advised we will cancel her appts as requested.

## 2021-04-24 ENCOUNTER — Encounter: Payer: Medicare Other | Admitting: Thoracic Surgery (Cardiothoracic Vascular Surgery)

## 2021-05-01 ENCOUNTER — Encounter: Payer: Self-pay | Admitting: Physician Assistant

## 2021-05-01 ENCOUNTER — Other Ambulatory Visit: Payer: Self-pay

## 2021-05-01 ENCOUNTER — Ambulatory Visit (INDEPENDENT_AMBULATORY_CARE_PROVIDER_SITE_OTHER): Payer: Medicare Other | Admitting: Physician Assistant

## 2021-05-01 VITALS — BP 109/54 | HR 60 | Resp 20 | Ht 63.0 in | Wt 116.0 lb

## 2021-05-01 DIAGNOSIS — Z9889 Other specified postprocedural states: Secondary | ICD-10-CM | POA: Diagnosis not present

## 2021-05-01 NOTE — Patient Instructions (Signed)
Constipation:  Oat bran high fiber cereal or muffins Apples Pears Oatmeal Prunes

## 2021-05-01 NOTE — Progress Notes (Signed)
[address]       [place] [address]             [phone]     [name] is a 70 y.o. female patient with a history of tobacco abuse and COPD.  She had a 50-pack-year history of smoking before quitting in January 2021.  She was found to have a right upper lobe lung nodule on a low-dose screening CT.  On PET CT the nodule was hypermetabolic and there was also a hypermetabolic right paratracheal node.  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  She was treated with neoadjuvant chemotherapy and immunotherapy and then underwent a robotic right upper lobectomy and node dissection.  Surgery was on 10/19/2020.  She went home with a chest tube in place.  It was removed in the office on 11/01/2020.   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 She was started on Lyrica at that time and encouraged to continue her Tramadol as needed. Last week she underwent a CT of the chest which is listed below. She returns for an office visit to review the study and follow-up in her neuropathic pain. She saw Dr. Julien Nordmann last week and adjuvant treatment with Tecentriq 1200mg  IV every 3 weeks for a total of 1 year starting July 12th. She will follow-up with their team in beginning of August.   Today, she is feeling much better.  She has not needed her hydrocodone much and has been titrating up on her Lyrica per her primary care provider.  She is now on Lyrica 75 mg 3 times daily.  She also feels that her constipation has improved.  She takes MiraLAX some days but Colace every day.  She is having a bowel movement about every 2 to 3 days which is a significant improvement.  1. S/P robot-assisted surgical procedure    Past Medical History:  Diagnosis Date   Cataract    COPD (chronic obstructive pulmonary disease) ([place])    No past surgical history pertinent negatives on file. Scheduled Meds: Current Outpatient  Medications on File Prior to Visit  Medication Sig Dispense Refill   albuterol (PROVENTIL HFA;VENTOLIN HFA) 108 (90 Base) MCG/ACT inhaler Inhale 2 puffs into the lungs every 4 (four) hours as needed for wheezing or shortness of breath (cough, shortness of breath or wheezing.). 1 Inhaler 1   Calcium Carbonate (CALCIUM 600 PO) Take 600 mg by mouth 2 (two) times daily.      Cholecalciferol (VITAMIN D3 PO) Take 2,000 Units by mouth daily.     dexamethasone (DECADRON) 4 MG tablet 1 tablet p.o. twice daily the day before, day of and day after chemotherapy every 3 weeks. 40 tablet 0   fluticasone (FLONASE) 50 MCG/ACT nasal spray Place 2 sprays into both nostrils daily. 16 g 12   Fluticasone-Salmeterol (ADVAIR) 250-50 MCG/DOSE AEPB Inhale 1 puff into the lungs 2 (two) times daily.      folic acid (FOLVITE) 1 MG tablet Take 1 tablet (1 mg total) by mouth daily. 30 tablet 4   guaiFENesin (MUCINEX) 600 MG 12 hr tablet Take 1 tablet (600 mg total) by mouth 2 (two) times daily as needed for cough or to loosen phlegm.     HYDROcodone-acetaminophen (NORCO/VICODIN) 5-325 MG tablet Take 1-2 tablets by mouth every 6 (six) hours as needed.     Multiple Vitamin (MULTIVITAMIN) tablet Take 1 tablet by  mouth daily.     prochlorperazine (COMPAZINE) 10 MG tablet Take 1 tablet (10 mg total) by mouth every 6 (six) hours as needed. 30 tablet 2   traZODone (DESYREL) 50 MG tablet Take 25-50 mg by mouth at bedtime.     pregabalin (LYRICA) 25 MG capsule Take 1 capsule (25 mg total) by mouth at bedtime for 3 days, THEN 1 capsule (25 mg total) 2 (two) times daily for 3 days, THEN 1 capsule (25 mg total) 3 (three) times daily. 81 capsule 1   No current facility-administered medications on file prior to visit.     No Known Allergies Active Problems:   * No active hospital problems. *  Height 5\' 3"  (1.6 m).  CLINICAL DATA:  Primary Cancer Type: Lung   Imaging Indication: Assess response to therapy   Interval therapy since  last imaging? Yes   Initial Cancer Diagnosis   Date: 05/08/2020; Established by: Biopsy-proven   Detailed Pathology: Stage IIIA non-small cell lung cancer, adenocarcinoma.   Primary Tumor location: Right upper lobe.   Surgeries: Right upper lobectomy 10/19/2020.   Chemotherapy: Yes; Ongoing? Yes; Most recent administration: 02/13/2021   Immunotherapy?  Yes; Type: Keytruda; Ongoing? No   Radiation therapy? No   EXAM: CT CHEST WITH CONTRAST   TECHNIQUE: Multidetector CT imaging of the chest was performed during intravenous contrast administration.   CONTRAST:  21mL OMNIPAQUE IOHEXOL 300 MG/ML  SOLN   COMPARISON:  Most recent CT chest 08/14/2020.   FINDINGS: Cardiovascular: New tiny pericardial effusion versus pericardial thickening. Aortic and coronary atherosclerotic calcification noted.   Mediastinum/Nodes: There has been resolution of previously seen mild right paratracheal lymphadenopathy since prior exam. No masses or pathologically enlarged lymph nodes identified.   Lungs/Pleura: Patient has undergone right upper lobectomy since previous study. Mild right-sided pleural-parenchymal scarring is seen as well as a tiny right pleural effusion versus pleural thickening. No suspicious pulmonary nodules or masses are identified.   Upper Abdomen:  Unremarkable.   Musculoskeletal:  No suspicious bone lesions.   IMPRESSION: Interval right upper lobectomy. No evidence of recurrent or metastatic carcinoma within the thorax. No active disease.   Aortic Atherosclerosis (ICD10-I70.0).     Electronically Signed   By: Marlaine Hind M.D.   On: 03/13/2021 11:49  Today's Vitals   05/01/21 1305  BP: (!) 109/54  Pulse: 60  Resp: 20  SpO2: 99%  Weight: 116 lb (52.6 kg)  Height: 5\' 3"  (1.6 m)   Body mass index is 20.55 kg/m.   Cor: RRR,no murmur Pulm: clear to auscultation Abd: soft, nontender Wound: healed, no erythema Ext: no  edema  Subjective Objective: Vital signs (most recent): Height 5\' 3"  (1.6 m). Assessment & Plan  stage IIIa (T1b, N2, M0) non-small cell lung cancer, adenocarcinoma presented with right upper lobe lung nodule in addition to mediastinal lymphadenopathy- Feb 3rd, 2022 she underwent a right upper lobectomy with lymph node dissection showing a residual tumer measuring 0.5cm with lymph node metastasis. She has been followed by Dr. Earlie Server.  Repeat CT scan reviewed with the patient showing no concerning findings for recurrence or metastasis.  Constipation-she still struggles with this from time to time.  She is intermittently taking MiraLAX but has been taking Colace daily.  I think weaning off of her narcotic pain medicines has helped.  I encouraged her to incorporate more fiber into her diet such as oat bran muffins, apples, pears, etc. She also has been trying to keep up with her hydration status.  Her  and her husband are going to make more efforts to be more active in the community and I suggested a few local parks for them to try. Intercostal pain-this has much improved since her last visit.  I think the Lyrica has certainly helped her quite a bit.  She still has some pain from time to time but it can be controlled with the Voltaren gel.  She feels that when she is constipated the pain in her side gets worse.  So, keeping a good bowel regimen has helped. She does notice that she has some tightness in her thumbs bilaterally which is likely related to her arthritis.  This has not been a huge bother to her.  Plan: Overall, the patient is doing much better from a pain and daily living perspective.  She is going to make more of an effort to incorporate fiber into her diet and to exercise more regularly.  We discussed continuing on the Lyrica for now but if she wants to wean off in the future to talk to her primary care provider.  They will need to put her on a few week long wean to avoid withdrawal symptoms.   She is always welcome to come back and visit [place] at any time however, I do not feel that we need to schedule any routine follow-up.  She is always welcome to call our office with any questions or concerns regarding her incisions or intercostal pain.   [name] 03/20/2021

## 2021-05-07 ENCOUNTER — Inpatient Hospital Stay (HOSPITAL_BASED_OUTPATIENT_CLINIC_OR_DEPARTMENT_OTHER): Payer: Medicare Other | Admitting: Internal Medicine

## 2021-05-07 ENCOUNTER — Inpatient Hospital Stay: Payer: Medicare Other

## 2021-05-07 ENCOUNTER — Other Ambulatory Visit: Payer: Self-pay

## 2021-05-07 ENCOUNTER — Inpatient Hospital Stay: Payer: Medicare Other | Attending: Physician Assistant

## 2021-05-07 ENCOUNTER — Encounter: Payer: Self-pay | Admitting: Internal Medicine

## 2021-05-07 VITALS — BP 97/60 | HR 71 | Temp 97.8°F | Resp 18 | Ht 63.0 in | Wt 115.7 lb

## 2021-05-07 DIAGNOSIS — C342 Malignant neoplasm of middle lobe, bronchus or lung: Secondary | ICD-10-CM | POA: Diagnosis not present

## 2021-05-07 DIAGNOSIS — C3411 Malignant neoplasm of upper lobe, right bronchus or lung: Secondary | ICD-10-CM

## 2021-05-07 DIAGNOSIS — Z5112 Encounter for antineoplastic immunotherapy: Secondary | ICD-10-CM | POA: Insufficient documentation

## 2021-05-07 DIAGNOSIS — C771 Secondary and unspecified malignant neoplasm of intrathoracic lymph nodes: Secondary | ICD-10-CM | POA: Diagnosis not present

## 2021-05-07 DIAGNOSIS — Z79899 Other long term (current) drug therapy: Secondary | ICD-10-CM | POA: Diagnosis not present

## 2021-05-07 DIAGNOSIS — Z5111 Encounter for antineoplastic chemotherapy: Secondary | ICD-10-CM | POA: Diagnosis present

## 2021-05-07 LAB — CBC WITH DIFFERENTIAL (CANCER CENTER ONLY)
Abs Immature Granulocytes: 0.02 10*3/uL (ref 0.00–0.07)
Basophils Absolute: 0 10*3/uL (ref 0.0–0.1)
Basophils Relative: 0 %
Eosinophils Absolute: 0 10*3/uL (ref 0.0–0.5)
Eosinophils Relative: 0 %
HCT: 29.4 % — ABNORMAL LOW (ref 36.0–46.0)
Hemoglobin: 9.7 g/dL — ABNORMAL LOW (ref 12.0–15.0)
Immature Granulocytes: 0 %
Lymphocytes Relative: 23 %
Lymphs Abs: 1.1 10*3/uL (ref 0.7–4.0)
MCH: 35.1 pg — ABNORMAL HIGH (ref 26.0–34.0)
MCHC: 33 g/dL (ref 30.0–36.0)
MCV: 106.5 fL — ABNORMAL HIGH (ref 80.0–100.0)
Monocytes Absolute: 0.5 10*3/uL (ref 0.1–1.0)
Monocytes Relative: 11 %
Neutro Abs: 3.1 10*3/uL (ref 1.7–7.7)
Neutrophils Relative %: 66 %
Platelet Count: 197 10*3/uL (ref 150–400)
RBC: 2.76 MIL/uL — ABNORMAL LOW (ref 3.87–5.11)
RDW: 14.1 % (ref 11.5–15.5)
WBC Count: 4.8 10*3/uL (ref 4.0–10.5)
nRBC: 0 % (ref 0.0–0.2)

## 2021-05-07 LAB — CMP (CANCER CENTER ONLY)
ALT: 19 U/L (ref 0–44)
AST: 26 U/L (ref 15–41)
Albumin: 4 g/dL (ref 3.5–5.0)
Alkaline Phosphatase: 74 U/L (ref 38–126)
Anion gap: 11 (ref 5–15)
BUN: 11 mg/dL (ref 8–23)
CO2: 27 mmol/L (ref 22–32)
Calcium: 8.9 mg/dL (ref 8.9–10.3)
Chloride: 103 mmol/L (ref 98–111)
Creatinine: 0.67 mg/dL (ref 0.44–1.00)
GFR, Estimated: >60 mL/min (ref >60)
Glucose, Bld: 80 mg/dL (ref 70–99)
Potassium: 3.9 mmol/L (ref 3.5–5.1)
Sodium: 141 mmol/L (ref 135–145)
Total Bilirubin: 0.4 mg/dL (ref 0.3–1.2)
Total Protein: 6.7 g/dL (ref 6.5–8.1)

## 2021-05-07 LAB — TSH: TSH: 24.341 u[IU]/mL — ABNORMAL HIGH (ref 0.308–3.960)

## 2021-05-07 MED ORDER — SODIUM CHLORIDE 0.9 % IV SOLN: Freq: Once | INTRAVENOUS | Status: AC

## 2021-05-07 MED ORDER — SODIUM CHLORIDE 0.9 % IV SOLN
1200.0000 mg | Freq: Once | INTRAVENOUS | Status: AC
  Administered 2021-05-07: 1200 mg via INTRAVENOUS
  Filled 2021-05-07: qty 20

## 2021-05-07 NOTE — Patient Instructions (Addendum)
[facility]   Discharge Instructions: Thank you for choosing [facility] to provide your oncology and hematology care.   If you have a lab appointment with the [facility], please go directly to the [facility] and check in at the registration area.   Wear comfortable clothing and clothing appropriate for easy access to any Portacath or PICC line.   We strive to give you quality time with your provider. You may need to reschedule your appointment if you arrive late (15 or more minutes).  Arriving late affects you and other patients whose appointments are after yours.  Also, if you miss three or more appointments without notifying the office, you may be dismissed from the clinic at the provider's discretion.      For prescription refill requests, have your pharmacy contact our office and allow 72 hours for refills to be completed.    Today you received the following chemotherapy and/or immunotherapy agents: atezolizumab.   To help prevent nausea and vomiting after your treatment, we encourage you to take your nausea medication as directed.  BELOW ARE SYMPTOMS THAT SHOULD BE REPORTED IMMEDIATELY: *FEVER GREATER THAN 100.4 F (38 C) OR HIGHER *CHILLS OR SWEATING *NAUSEA AND VOMITING THAT IS NOT CONTROLLED WITH YOUR NAUSEA MEDICATION *UNUSUAL SHORTNESS OF BREATH *UNUSUAL BRUISING OR BLEEDING *URINARY PROBLEMS (pain or burning when urinating, or frequent urination) *BOWEL PROBLEMS (unusual diarrhea, constipation, pain near the anus) TENDERNESS IN MOUTH AND THROAT WITH OR WITHOUT PRESENCE OF ULCERS (sore throat, sores in mouth, or a toothache) UNUSUAL RASH, SWELLING OR PAIN  UNUSUAL VAGINAL DISCHARGE OR ITCHING   Items with * indicate a potential emergency and should be followed up as soon as possible or go to the Emergency Department if any problems should occur.  Please show the CHEMOTHERAPY ALERT CARD or IMMUNOTHERAPY ALERT CARD at check-in  to the Emergency Department and triage nurse.  Should you have questions after your visit or need to cancel or reschedule your appointment, please contact [facility]  Dept: [phone]  and follow the prompts.  Office hours are 8:00 a.m. to 4:30 p.m. Monday - Friday. Please note that voicemails left after 4:00 p.m. may not be returned until the following business day.  We are closed weekends and major holidays. You have access to a nurse at all times for urgent questions. Please call the main number to the clinic Dept: [phone] and follow the prompts.   For any non-urgent questions, you may also contact your provider using MyChart. We now offer e-Visits for anyone 30 and older to request care online for non-urgent symptoms. For details visit [email or web address].   Also download the MyChart app! Go to the app store, search "MyChart", open the app, select [facility], and log in with your MyChart username and password.  Due to Covid, a mask is required upon entering the hospital/clinic. If you do not have a mask, one will be given to you upon arrival. For doctor visits, patients may have 1 support person aged 63 or older with them. For treatment visits, patients cannot have anyone with them due to current Covid guidelines and our immunocompromised population.

## 2021-05-07 NOTE — Progress Notes (Signed)
[facility] Telephone:(336) [phone]   Fax:(336) [phone]  OFFICE PROGRESS NOTE  [name], [place]  [address]  DIAGNOSIS: Stage IIIA (T1b, N2, M0) non-small cell lung cancer, adenocarcinoma presented with right upper lobe lung nodule as well as right upper paratracheal adenopathy and suspicious tiny nodule in the right middle lobe. The patient has PD-L1 expression was 90%  PRIOR THERAPY:  1) Neoadjuvant systemic chemotherapy with carboplatin for AUC of 5, Alimta 500 mg/M2 and Keytruda 200 mg IV every 3 weeks.  First dose 05/30/2020. Status post 3 cycles. 2) status post robotic assisted right thoracoscopy with right upper lobectomy and lymph node dissection under the care of Dr. Roxan Hockey on October 19, 2020.  There was residual tumor measuring 0.5 cm with lymph node metastasis involving 4R, 12 and hilar lymph nodes. 3) Adjuvant systemic chemotherapy with carboplatin for AUC of 5 and Alimta 500 Mg/M2 every 3 weeks.  First dose was given December 11, 2020.  Status post 4 cycles.  Last dose of chemotherapy was given Feb 13, 2021.    CURRENT THERAPY: Adjuvant immunotherapy with Atezolizumab 1200 Mg IV every 3 weeks.  First dose March 27, 2021.  Status post 1 cycle.   INTERVAL HISTORY: [name] 70 y.o. female returns to the clinic today for follow-up visit.  The patient is feeling fine today with no concerning complaints.  She tolerated the first cycle of her treatment with atezolizumab fairly well.  Her cycle #2 was delayed because she was busy taking care of the memorial for her mother.  She denied having any current chest pain, shortness of breath, cough or hemoptysis.  She denied having any fever or chills.  She has no nausea, vomiting, diarrhea or constipation.  She has no skin rash.  She is here today for evaluation before starting cycle #2.   MEDICAL HISTORY: Past Medical History:  Diagnosis Date   Cataract    COPD  (chronic obstructive pulmonary disease) (HCC)     ALLERGIES:  has No Known Allergies.  MEDICATIONS:  Current Outpatient Medications  Medication Sig Dispense Refill   albuterol (PROVENTIL HFA;VENTOLIN HFA) 108 (90 Base) MCG/ACT inhaler Inhale 2 puffs into the lungs every 4 (four) hours as needed for wheezing or shortness of breath (cough, shortness of breath or wheezing.). 1 Inhaler 1   Calcium Carbonate (CALCIUM 600 PO) Take 600 mg by mouth 2 (two) times daily.      Cholecalciferol (VITAMIN D3 PO) Take 2,000 Units by mouth daily.     fluticasone (FLONASE) 50 MCG/ACT nasal spray Place 2 sprays into both nostrils daily. 16 g 12   Fluticasone-Salmeterol (ADVAIR) 250-50 MCG/DOSE AEPB Inhale 1 puff into the lungs 2 (two) times daily.      guaiFENesin (MUCINEX) 600 MG 12 hr tablet Take 1 tablet (600 mg total) by mouth 2 (two) times daily as needed for cough or to loosen phlegm.     HYDROcodone-acetaminophen (NORCO/VICODIN) 5-325 MG tablet Take 1-2 tablets by mouth every 6 (six) hours as needed.     levothyroxine (SYNTHROID) 50 MCG tablet TAKE 1 TABLET(50 MCG) BY MOUTH DAILY BEFORE AND BREAKFAST 90 tablet 0   Multiple Vitamin (MULTIVITAMIN) tablet Take 1 tablet by mouth daily.     pregabalin (LYRICA) 75 MG capsule Take 75 mg by mouth 3 (three) times daily.     traZODone (DESYREL) 50 MG tablet Take 25-50 mg by mouth at bedtime.     No current  facility-administered medications for this visit.    SURGICAL HISTORY:  Past Surgical History:  Procedure Laterality Date   APPENDECTOMY  2008   COLON SURGERY  2008   colostomy-infection post op appendectomy   COLOSTOMY REVERSAL  2009   INTERCOSTAL NERVE BLOCK Right 10/19/2020   Procedure: INTERCOSTAL NERVE BLOCK;  Surgeon: [name], MD;  Location: [facility];  Service: Thoracic;  Laterality: Right;   NODE DISSECTION Right 10/19/2020   Procedure: NODE DISSECTION;  Surgeon: [name], MD;  Location: [facility];  Service: Thoracic;  Laterality:  Right;   VIDEO BRONCHOSCOPY WITH ENDOBRONCHIAL NAVIGATION N/A 05/08/2020   Procedure: VIDEO BRONCHOSCOPY WITH ENDOBRONCHIAL NAVIGATION;  Surgeon: [name], MD;  Location: [facility];  Service: Thoracic;  Laterality: N/A;   VIDEO BRONCHOSCOPY WITH ENDOBRONCHIAL ULTRASOUND N/A 05/08/2020   Procedure: VIDEO BRONCHOSCOPY WITH ENDOBRONCHIAL ULTRASOUND;  Surgeon: [name], MD;  Location: [facility];  Service: Thoracic;  Laterality: N/A;    REVIEW OF SYSTEMS:  A comprehensive review of systems was negative except for: Constitutional: positive for fatigue   PHYSICAL EXAMINATION: General appearance: alert, cooperative, fatigued, and no distress Head: Normocephalic, without obvious abnormality, atraumatic Neck: no adenopathy, no JVD, supple, symmetrical, trachea midline, and thyroid not enlarged, symmetric, no tenderness/mass/nodules Lymph nodes: Cervical, supraclavicular, and axillary nodes normal. Resp: clear to auscultation bilaterally Back: symmetric, no curvature. ROM normal. No CVA tenderness. Cardio: regular rate and rhythm, S1, S2 normal, no murmur, click, rub or gallop GI: soft, non-tender; bowel sounds normal; no masses,  no organomegaly Extremities: extremities normal, atraumatic, no cyanosis or edema  ECOG PERFORMANCE STATUS: 1 - Symptomatic but completely ambulatory  Blood pressure 97/60, pulse 71, temperature 97.8 F (36.6 C), temperature source Tympanic, resp. rate 18, height 5' 3"  (1.6 m), weight 115 lb 11.2 oz (52.5 kg), SpO2 98 %.  LABORATORY DATA: Lab Results  Component Value Date   WBC 4.8 05/07/2021   HGB 9.7 (L) 05/07/2021   HCT 29.4 (L) 05/07/2021   MCV 106.5 (H) 05/07/2021   PLT 197 05/07/2021      Chemistry      Component Value Date/Time   NA 141 05/07/2021 1015   NA 135 10/29/2017 1131   K 3.9 05/07/2021 1015   CL 103 05/07/2021 1015   CO2 27 05/07/2021 1015   BUN 11 05/07/2021 1015   BUN 5 (L) 10/29/2017 1131   CREATININE 0.67 05/07/2021 1015       Component Value Date/Time   CALCIUM 8.9 05/07/2021 1015   ALKPHOS 74 05/07/2021 1015   AST 26 05/07/2021 1015   ALT 19 05/07/2021 1015   BILITOT 0.4 05/07/2021 1015       RADIOGRAPHIC STUDIES: No results found.  ASSESSMENT AND PLAN: This is a very pleasant 70 years old white female recently diagnosed with a stage IIIa (T1b, N2, M0) non-small cell lung cancer, adenocarcinoma presented with right upper lobe lung nodule in addition to mediastinal lymphadenopathy.  PD-L1 expression 90%. The patient had MRI of the brain performed recently that showed no evidence of intracranial metastatic disease. The patient underwent a course of neoadjuvant treatment with carboplatin for AUC of 5, Alimta 500 mg/M2 and Keytruda 200 mg IV every 3 weeks for 3 cycles.   She tolerated this treatment well with no concerning adverse effect except for dry skin and itching. Her repeat CT scan after the neoadjuvant treatment showed partial response with around 50% reduction in the tumor volume. The patient underwent right upper lobectomy with lymph node  dissection on October 19, 2020 and the final pathology showed residual tumor measuring 0.5 cm with lymph node metastasis involving hilar and mediastinal lymph nodes. She underwent adjuvant systemic chemotherapy with carboplatin for AUC of 5, Alimta 500 mg/M2 every 3 weeks since she has good response to this treatment in the past.  She is status post 4 cycles.  She tolerated this treatment well with no concerning adverse effect except for mild fatigue. She is currently undergoing adjuvant immunotherapy with Tecentriq 1200 Mg IV every 3 weeks status post 1 cycle.  She tolerated the first cycle of her treatment well. The patient missed her treatment few weeks ago because she was arranging for the memorial of her mother. I recommended for her to proceed with cycle #2 today as planned. I will see the patient back for follow-up visit in 3 weeks for evaluation before  starting cycle #3. She was advised to call immediately if she has any concerning symptoms in the interval. The patient voices understanding of current disease status and treatment options and is in agreement with the current care plan.  All questions were answered. The patient knows to call the clinic with any problems, questions or concerns. We can certainly see the patient much sooner if necessary.   Disclaimer: This note was dictated with voice recognition software. Similar sounding words can inadvertently be transcribed and may not be corrected upon review.

## 2021-05-29 ENCOUNTER — Inpatient Hospital Stay: Payer: Medicare Other

## 2021-05-29 ENCOUNTER — Inpatient Hospital Stay: Payer: Medicare Other | Attending: Physician Assistant | Admitting: Internal Medicine

## 2021-05-29 ENCOUNTER — Other Ambulatory Visit: Payer: Self-pay | Admitting: Internal Medicine

## 2021-05-29 ENCOUNTER — Other Ambulatory Visit: Payer: Self-pay

## 2021-05-29 VITALS — BP 121/75 | HR 62 | Temp 97.2°F | Resp 19 | Ht 63.0 in | Wt 116.7 lb

## 2021-05-29 DIAGNOSIS — C771 Secondary and unspecified malignant neoplasm of intrathoracic lymph nodes: Secondary | ICD-10-CM | POA: Diagnosis not present

## 2021-05-29 DIAGNOSIS — C3411 Malignant neoplasm of upper lobe, right bronchus or lung: Secondary | ICD-10-CM | POA: Insufficient documentation

## 2021-05-29 DIAGNOSIS — Z79899 Other long term (current) drug therapy: Secondary | ICD-10-CM | POA: Diagnosis not present

## 2021-05-29 DIAGNOSIS — Z5112 Encounter for antineoplastic immunotherapy: Secondary | ICD-10-CM | POA: Insufficient documentation

## 2021-05-29 LAB — CBC WITH DIFFERENTIAL (CANCER CENTER ONLY)
Abs Immature Granulocytes: 0.02 10*3/uL (ref 0.00–0.07)
Basophils Absolute: 0 10*3/uL (ref 0.0–0.1)
Basophils Relative: 1 %
Eosinophils Absolute: 0.1 10*3/uL (ref 0.0–0.5)
Eosinophils Relative: 1 %
HCT: 32.7 % — ABNORMAL LOW (ref 36.0–46.0)
Hemoglobin: 10.6 g/dL — ABNORMAL LOW (ref 12.0–15.0)
Immature Granulocytes: 0 %
Lymphocytes Relative: 29 %
Lymphs Abs: 1.5 10*3/uL (ref 0.7–4.0)
MCH: 34.3 pg — ABNORMAL HIGH (ref 26.0–34.0)
MCHC: 32.4 g/dL (ref 30.0–36.0)
MCV: 105.8 fL — ABNORMAL HIGH (ref 80.0–100.0)
Monocytes Absolute: 0.5 10*3/uL (ref 0.1–1.0)
Monocytes Relative: 10 %
Neutro Abs: 3 10*3/uL (ref 1.7–7.7)
Neutrophils Relative %: 59 %
Platelet Count: 173 10*3/uL (ref 150–400)
RBC: 3.09 MIL/uL — ABNORMAL LOW (ref 3.87–5.11)
RDW: 12 % (ref 11.5–15.5)
WBC Count: 5.1 10*3/uL (ref 4.0–10.5)
nRBC: 0 % (ref 0.0–0.2)

## 2021-05-29 LAB — CMP (CANCER CENTER ONLY)
ALT: 15 U/L (ref 0–44)
AST: 23 U/L (ref 15–41)
Albumin: 4 g/dL (ref 3.5–5.0)
Alkaline Phosphatase: 71 U/L (ref 38–126)
Anion gap: 10 (ref 5–15)
BUN: 8 mg/dL (ref 8–23)
CO2: 28 mmol/L (ref 22–32)
Calcium: 9.7 mg/dL (ref 8.9–10.3)
Chloride: 103 mmol/L (ref 98–111)
Creatinine: 0.68 mg/dL (ref 0.44–1.00)
GFR, Estimated: >60 mL/min (ref >60)
Glucose, Bld: 78 mg/dL (ref 70–99)
Potassium: 3.8 mmol/L (ref 3.5–5.1)
Sodium: 141 mmol/L (ref 135–145)
Total Bilirubin: 0.4 mg/dL (ref 0.3–1.2)
Total Protein: 6.8 g/dL (ref 6.5–8.1)

## 2021-05-29 LAB — TSH: TSH: 38.022 u[IU]/mL — ABNORMAL HIGH (ref 0.350–4.500)

## 2021-05-29 MED ORDER — LEVOTHYROXINE SODIUM 75 MCG PO TABS: 75.0000 ug | ORAL_TABLET | Freq: Every day | ORAL | 2 refills | Status: DC

## 2021-05-29 MED ORDER — SODIUM CHLORIDE 0.9 % IV SOLN
1200.0000 mg | Freq: Once | INTRAVENOUS | Status: AC
  Administered 2021-05-29: 1200 mg via INTRAVENOUS
  Filled 2021-05-29: qty 20

## 2021-05-29 MED ORDER — SODIUM CHLORIDE 0.9 % IV SOLN: Freq: Once | INTRAVENOUS | Status: AC

## 2021-05-29 NOTE — Progress Notes (Signed)
[facility] Telephone:(336) [phone]   Fax:(336) [phone]  OFFICE PROGRESS NOTE  [name], [place] [address] [place] [address]  DIAGNOSIS: Stage IIIA (T1b, N2, M0) non-small cell lung cancer, adenocarcinoma presented with right upper lobe lung nodule as well as right upper paratracheal adenopathy and suspicious tiny nodule in the right middle lobe. The patient has PD-L1 expression was 90%  PRIOR THERAPY:  1) Neoadjuvant systemic chemotherapy with carboplatin for AUC of 5, Alimta 500 mg/M2 and Keytruda 200 mg IV every 3 weeks.  First dose 05/30/2020. Status post 3 cycles. 2) status post robotic assisted right thoracoscopy with right upper lobectomy and lymph node dissection under the care of Dr. Roxan Hockey on October 19, 2020.  There was residual tumor measuring 0.5 cm with lymph node metastasis involving 4R, 12 and hilar lymph nodes. 3) Adjuvant systemic chemotherapy with carboplatin for AUC of 5 and Alimta 500 Mg/M2 every 3 weeks.  First dose was given December 11, 2020.  Status post 4 cycles.  Last dose of chemotherapy was given Feb 13, 2021.    CURRENT THERAPY: Adjuvant immunotherapy with Atezolizumab 1200 Mg IV every 3 weeks.  First dose March 27, 2021.  Status post 2 cycles.   INTERVAL HISTORY: [name] 70 y.o. female returns to the clinic today for follow-up visit.  The patient is feeling fine today with no concerning complaints except for mild numbness in this arms and toes.  She denied having any current chest pain, shortness of breath, cough or hemoptysis.  She denied having any fever or chills.  She has no nausea, vomiting, diarrhea or constipation.  She has no headache or visual changes.  She denied having any significant weight loss or night sweats.  She continues to tolerate her treatment with Tecentriq fairly well.  The patient is here today for evaluation before starting cycle #3.   MEDICAL HISTORY: Past Medical History:   Diagnosis Date   Cataract    COPD (chronic obstructive pulmonary disease) (HCC)     ALLERGIES:  has No Known Allergies.  MEDICATIONS:  Current Outpatient Medications  Medication Sig Dispense Refill   albuterol (PROVENTIL HFA;VENTOLIN HFA) 108 (90 Base) MCG/ACT inhaler Inhale 2 puffs into the lungs every 4 (four) hours as needed for wheezing or shortness of breath (cough, shortness of breath or wheezing.). 1 Inhaler 1   Calcium Carbonate (CALCIUM 600 PO) Take 600 mg by mouth 2 (two) times daily.      Cholecalciferol (VITAMIN D3 PO) Take 2,000 Units by mouth daily.     fluticasone (FLONASE) 50 MCG/ACT nasal spray Place 2 sprays into both nostrils daily. 16 g 12   Fluticasone-Salmeterol (ADVAIR) 250-50 MCG/DOSE AEPB Inhale 1 puff into the lungs 2 (two) times daily.      guaiFENesin (MUCINEX) 600 MG 12 hr tablet Take 1 tablet (600 mg total) by mouth 2 (two) times daily as needed for cough or to loosen phlegm.     HYDROcodone-acetaminophen (NORCO/VICODIN) 5-325 MG tablet Take 1-2 tablets by mouth every 6 (six) hours as needed.     levothyroxine (SYNTHROID) 50 MCG tablet TAKE 1 TABLET(50 MCG) BY MOUTH DAILY BEFORE AND BREAKFAST 90 tablet 0   Multiple Vitamin (MULTIVITAMIN) tablet Take 1 tablet by mouth daily.     pregabalin (LYRICA) 75 MG capsule Take 75 mg by mouth 3 (three) times daily.     traZODone (DESYREL) 50 MG tablet Take 25-50 mg by mouth at bedtime.  No current facility-administered medications for this visit.    SURGICAL HISTORY:  Past Surgical History:  Procedure Laterality Date   APPENDECTOMY  2008   COLON SURGERY  2008   colostomy-infection post op appendectomy   COLOSTOMY REVERSAL  2009   INTERCOSTAL NERVE BLOCK Right 10/19/2020   Procedure: INTERCOSTAL NERVE BLOCK;  Surgeon: [name], MD;  Location: [facility];  Service: Thoracic;  Laterality: Right;   NODE DISSECTION Right 10/19/2020   Procedure: NODE DISSECTION;  Surgeon: [name], MD;  Location:  [facility];  Service: Thoracic;  Laterality: Right;   VIDEO BRONCHOSCOPY WITH ENDOBRONCHIAL NAVIGATION N/A 05/08/2020   Procedure: VIDEO BRONCHOSCOPY WITH ENDOBRONCHIAL NAVIGATION;  Surgeon: [name], MD;  Location: [facility];  Service: Thoracic;  Laterality: N/A;   VIDEO BRONCHOSCOPY WITH ENDOBRONCHIAL ULTRASOUND N/A 05/08/2020   Procedure: VIDEO BRONCHOSCOPY WITH ENDOBRONCHIAL ULTRASOUND;  Surgeon: [name], MD;  Location[facility];  Service: Thoracic;  Laterality: N/A;    REVIEW OF SYSTEMS:  A comprehensive review of systems was negative except for: Neurological: positive for paresthesia   PHYSICAL EXAMINATION: General appearance: alert, cooperative, fatigued, and no distress Head: Normocephalic, without obvious abnormality, atraumatic Neck: no adenopathy, no JVD, supple, symmetrical, trachea midline, and thyroid not enlarged, symmetric, no tenderness/mass/nodules Lymph nodes: Cervical, supraclavicular, and axillary nodes normal. Resp: clear to auscultation bilaterally Back: symmetric, no curvature. ROM normal. No CVA tenderness. Cardio: regular rate and rhythm, S1, S2 normal, no murmur, click, rub or gallop GI: soft, non-tender; bowel sounds normal; no masses,  no organomegaly Extremities: extremities normal, atraumatic, no cyanosis or edema  ECOG PERFORMANCE STATUS: 1 - Symptomatic but completely ambulatory  Blood pressure 121/75, pulse 62, temperature (!) 97.2 F (36.2 C), temperature source Tympanic, resp. rate 19, height 5' 3"  (1.6 m), weight 116 lb 11.2 oz (52.9 kg), SpO2 98 %.  LABORATORY DATA: Lab Results  Component Value Date   WBC 5.1 05/29/2021   HGB 10.6 (L) 05/29/2021   HCT 32.7 (L) 05/29/2021   MCV 105.8 (H) 05/29/2021   PLT 173 05/29/2021      Chemistry      Component Value Date/Time   NA 141 05/29/2021 0923   NA 135 10/29/2017 1131   K 3.8 05/29/2021 0923   CL 103 05/29/2021 0923   CO2 28 05/29/2021 0923   BUN 8 05/29/2021 0923   BUN 5 (L)  10/29/2017 1131   CREATININE 0.68 05/29/2021 0923      Component Value Date/Time   CALCIUM 9.7 05/29/2021 0923   ALKPHOS 71 05/29/2021 0923   AST 23 05/29/2021 0923   ALT 15 05/29/2021 0923   BILITOT 0.4 05/29/2021 0923       RADIOGRAPHIC STUDIES: No results found.  ASSESSMENT AND PLAN: This is a very pleasant 70 years old white female recently diagnosed with a stage IIIa (T1b, N2, M0) non-small cell lung cancer, adenocarcinoma presented with right upper lobe lung nodule in addition to mediastinal lymphadenopathy.  PD-L1 expression 90%. The patient had MRI of the brain performed recently that showed no evidence of intracranial metastatic disease. The patient underwent a course of neoadjuvant treatment with carboplatin for AUC of 5, Alimta 500 mg/M2 and Keytruda 200 mg IV every 3 weeks for 3 cycles.   She tolerated this treatment well with no concerning adverse effect except for dry skin and itching. Her repeat CT scan after the neoadjuvant treatment showed partial response with around 50% reduction in the tumor volume. The patient underwent right upper lobectomy  with lymph node dissection on October 19, 2020 and the final pathology showed residual tumor measuring 0.5 cm with lymph node metastasis involving hilar and mediastinal lymph nodes. She underwent adjuvant systemic chemotherapy with carboplatin for AUC of 5, Alimta 500 mg/M2 every 3 weeks since she has good response to this treatment in the past.  She is status post 4 cycles.  She tolerated this treatment well with no concerning adverse effect except for mild fatigue. She is currently undergoing adjuvant immunotherapy with Tecentriq 1200 Mg IV every 3 weeks status post 2 cycles.   The patient continues to tolerate her treatment fairly well with no concerning adverse effect except for mild peripheral neuropathy. I recommended for her to proceed with cycle #3 today as planned. I will see her back for follow-up visit in 3 weeks for  evaluation before the next cycle of her treatment. The patient was advised to call immediately if she has any concerning symptoms in the interval. The patient voices understanding of current disease status and treatment options and is in agreement with the current care plan.  All questions were answered. The patient knows to call the clinic with any problems, questions or concerns. We can certainly see the patient much sooner if necessary.   Disclaimer: This note was dictated with voice recognition software. Similar sounding words can inadvertently be transcribed and may not be corrected upon review.

## 2021-05-29 NOTE — Patient Instructions (Signed)
[facility]  Discharge Instructions: Thank you for choosing [facility] to provide your oncology and hematology care.   If you have a lab appointment with the [facility], please go directly to the [facility] and check in at the registration area.   Wear comfortable clothing and clothing appropriate for easy access to any Portacath or PICC line.   We strive to give you quality time with your provider. You may need to reschedule your appointment if you arrive late (15 or more minutes).  Arriving late affects you and other patients whose appointments are after yours.  Also, if you miss three or more appointments without notifying the office, you may be dismissed from the clinic at the provider's discretion.      For prescription refill requests, have your pharmacy contact our office and allow 72 hours for refills to be completed.    Today you received the following chemotherapy and/or immunotherapy agents :  Tecentriq/Atezolizomab.       To help prevent nausea and vomiting after your treatment, we encourage you to take your nausea medication as directed.  BELOW ARE SYMPTOMS THAT SHOULD BE REPORTED IMMEDIATELY: *FEVER GREATER THAN 100.4 F (38 C) OR HIGHER *CHILLS OR SWEATING *NAUSEA AND VOMITING THAT IS NOT CONTROLLED WITH YOUR NAUSEA MEDICATION *UNUSUAL SHORTNESS OF BREATH *UNUSUAL BRUISING OR BLEEDING *URINARY PROBLEMS (pain or burning when urinating, or frequent urination) *BOWEL PROBLEMS (unusual diarrhea, constipation, pain near the anus) TENDERNESS IN MOUTH AND THROAT WITH OR WITHOUT PRESENCE OF ULCERS (sore throat, sores in mouth, or a toothache) UNUSUAL RASH, SWELLING OR PAIN  UNUSUAL VAGINAL DISCHARGE OR ITCHING   Items with * indicate a potential emergency and should be followed up as soon as possible or go to the Emergency Department if any problems should occur.  Please show the CHEMOTHERAPY ALERT CARD or IMMUNOTHERAPY ALERT  CARD at check-in to the Emergency Department and triage nurse.  Should you have questions after your visit or need to cancel or reschedule your appointment, please contact [facility]  Dept: [phone]  and follow the prompts.  Office hours are 8:00 a.m. to 4:30 p.m. Monday - Friday. Please note that voicemails left after 4:00 p.m. may not be returned until the following business day.  We are closed weekends and major holidays. You have access to a nurse at all times for urgent questions. Please call the main number to the clinic Dept: [phone] and follow the prompts.   For any non-urgent questions, you may also contact your provider using MyChart. We now offer e-Visits for anyone 23 and older to request care online for non-urgent symptoms. For details visit [email or web address].   Also download the MyChart app! Go to the app store, search "MyChart", open the app, select [facility], and log in with your MyChart username and password.  Due to Covid, a mask is required upon entering the hospital/clinic. If you do not have a mask, one will be given to you upon arrival. For doctor visits, patients may have 1 support person aged 10 or older with them. For treatment visits, patients cannot have anyone with them due to current Covid guidelines and our immunocompromised population.

## 2021-06-18 ENCOUNTER — Inpatient Hospital Stay: Payer: Medicare Other

## 2021-06-18 ENCOUNTER — Encounter: Payer: Self-pay | Admitting: Internal Medicine

## 2021-06-18 ENCOUNTER — Other Ambulatory Visit: Payer: Self-pay

## 2021-06-18 ENCOUNTER — Inpatient Hospital Stay: Payer: Medicare Other | Attending: Physician Assistant | Admitting: Internal Medicine

## 2021-06-18 VITALS — BP 111/63 | HR 67 | Temp 97.4°F | Resp 18 | Ht 63.0 in | Wt 117.1 lb

## 2021-06-18 DIAGNOSIS — C3411 Malignant neoplasm of upper lobe, right bronchus or lung: Secondary | ICD-10-CM | POA: Diagnosis not present

## 2021-06-18 DIAGNOSIS — Z79899 Other long term (current) drug therapy: Secondary | ICD-10-CM | POA: Diagnosis not present

## 2021-06-18 DIAGNOSIS — C771 Secondary and unspecified malignant neoplasm of intrathoracic lymph nodes: Secondary | ICD-10-CM | POA: Diagnosis not present

## 2021-06-18 DIAGNOSIS — Z5112 Encounter for antineoplastic immunotherapy: Secondary | ICD-10-CM | POA: Diagnosis present

## 2021-06-18 LAB — CBC WITH DIFFERENTIAL (CANCER CENTER ONLY)
Abs Immature Granulocytes: 0.03 10*3/uL (ref 0.00–0.07)
Basophils Absolute: 0 10*3/uL (ref 0.0–0.1)
Basophils Relative: 0 %
Eosinophils Absolute: 0.1 10*3/uL (ref 0.0–0.5)
Eosinophils Relative: 2 %
HCT: 36.6 % (ref 36.0–46.0)
Hemoglobin: 12 g/dL (ref 12.0–15.0)
Immature Granulocytes: 0 %
Lymphocytes Relative: 22 %
Lymphs Abs: 1.5 10*3/uL (ref 0.7–4.0)
MCH: 33.2 pg (ref 26.0–34.0)
MCHC: 32.8 g/dL (ref 30.0–36.0)
MCV: 101.4 fL — ABNORMAL HIGH (ref 80.0–100.0)
Monocytes Absolute: 0.7 10*3/uL (ref 0.1–1.0)
Monocytes Relative: 10 %
Neutro Abs: 4.5 10*3/uL (ref 1.7–7.7)
Neutrophils Relative %: 66 %
Platelet Count: 216 10*3/uL (ref 150–400)
RBC: 3.61 MIL/uL — ABNORMAL LOW (ref 3.87–5.11)
RDW: 11.8 % (ref 11.5–15.5)
WBC Count: 6.9 10*3/uL (ref 4.0–10.5)
nRBC: 0 % (ref 0.0–0.2)

## 2021-06-18 LAB — CMP (CANCER CENTER ONLY)
ALT: 13 U/L (ref 0–44)
AST: 26 U/L (ref 15–41)
Albumin: 4.2 g/dL (ref 3.5–5.0)
Alkaline Phosphatase: 69 U/L (ref 38–126)
Anion gap: 13 (ref 5–15)
BUN: 9 mg/dL (ref 8–23)
CO2: 23 mmol/L (ref 22–32)
Calcium: 9.6 mg/dL (ref 8.9–10.3)
Chloride: 104 mmol/L (ref 98–111)
Creatinine: 0.67 mg/dL (ref 0.44–1.00)
GFR, Estimated: >60 mL/min (ref >60)
Glucose, Bld: 73 mg/dL (ref 70–99)
Potassium: 4.1 mmol/L (ref 3.5–5.1)
Sodium: 140 mmol/L (ref 135–145)
Total Bilirubin: 0.3 mg/dL (ref 0.3–1.2)
Total Protein: 7.3 g/dL (ref 6.5–8.1)

## 2021-06-18 LAB — TSH: TSH: 24.813 u[IU]/mL — ABNORMAL HIGH (ref 0.308–3.960)

## 2021-06-18 MED ORDER — SODIUM CHLORIDE 0.9 % IV SOLN
1200.0000 mg | Freq: Once | INTRAVENOUS | Status: AC
  Administered 2021-06-18: 1200 mg via INTRAVENOUS
  Filled 2021-06-18: qty 20

## 2021-06-18 MED ORDER — SODIUM CHLORIDE 0.9 % IV SOLN: Freq: Once | INTRAVENOUS | Status: AC

## 2021-06-18 NOTE — Progress Notes (Signed)
[facility] [phone]   Fax:([phone]) [phone]  OFFICE PROGRESS NOTE  [name], [name] [address] [place] [address]  DIAGNOSIS: Stage IIIA (T1b, N2, M0) non-small cell lung cancer, adenocarcinoma presented with right upper lobe lung nodule as well as right upper paratracheal adenopathy and suspicious tiny nodule in the right middle lobe. The patient has PD-L1 expression was 90%  PRIOR THERAPY:  1) Neoadjuvant systemic chemotherapy with carboplatin for AUC of 5, Alimta 500 mg/M2 and Keytruda 200 mg IV every 3 weeks.  First dose 05/30/2020. Status post 3 cycles. 2) status post robotic assisted right thoracoscopy with right upper lobectomy and lymph node dissection under the care of Dr. Roxan Hockey on October 19, 2020.  There was residual tumor measuring 0.5 cm with lymph node metastasis involving 4R, 12 and hilar lymph nodes. 3) Adjuvant systemic chemotherapy with carboplatin for AUC of 5 and Alimta 500 Mg/M2 every 3 weeks.  First dose was given December 11, 2020.  Status post 4 cycles.  Last dose of chemotherapy was given Feb 13, 2021.   CURRENT THERAPY: Adjuvant immunotherapy with Atezolizumab 1200 Mg IV every 3 weeks.  First dose March 27, 2021.  Status post 4 cycles.  INTERVAL HISTORY: [name] 70 y.o. female returns to the clinic today for follow-up visit.  The patient is feeling fine today with no concerning complaints.  She has been tolerating her treatment with adjuvant atezolizumab fairly well.  She denied having any current skin rash or diarrhea.  She has no chest pain, shortness of breath, cough or hemoptysis.  She has no nausea, vomiting, diarrhea or constipation.  She has no headache or visual changes.  She has no fever or chills.  The patient is here today for evaluation before starting cycle #4 of her treatment.   MEDICAL HISTORY: Past Medical History:  Diagnosis Date   Cataract    COPD (chronic obstructive pulmonary  disease) (HCC)     ALLERGIES:  has No Known Allergies.  MEDICATIONS:  Current Outpatient Medications  Medication Sig Dispense Refill   albuterol (PROVENTIL HFA;VENTOLIN HFA) 108 (90 Base) MCG/ACT inhaler Inhale 2 puffs into the lungs every 4 (four) hours as needed for wheezing or shortness of breath (cough, shortness of breath or wheezing.). 1 Inhaler 1   Calcium Carbonate (CALCIUM 600 PO) Take 600 mg by mouth 2 (two) times daily.      Cholecalciferol (VITAMIN D3 PO) Take 2,000 Units by mouth daily.     fluticasone (FLONASE) 50 MCG/ACT nasal spray Place 2 sprays into both nostrils daily. 16 g 12   Fluticasone-Salmeterol (ADVAIR) 250-50 MCG/DOSE AEPB Inhale 1 puff into the lungs 2 (two) times daily.      guaiFENesin (MUCINEX) 600 MG 12 hr tablet Take 1 tablet (600 mg total) by mouth 2 (two) times daily as needed for cough or to loosen phlegm.     HYDROcodone-acetaminophen (NORCO/VICODIN) 5-325 MG tablet Take 1-2 tablets by mouth every 6 (six) hours as needed.     levothyroxine (SYNTHROID) 75 MCG tablet Take 1 tablet (75 mcg total) by mouth daily before breakfast. 30 tablet 2   Multiple Vitamin (MULTIVITAMIN) tablet Take 1 tablet by mouth daily.     pregabalin (LYRICA) 75 MG capsule Take 75 mg by mouth 3 (three) times daily.     traZODone (DESYREL) 50 MG tablet Take 25-50 mg by mouth at bedtime.     No current facility-administered medications for this visit.  SURGICAL HISTORY:  Past Surgical History:  Procedure Laterality Date   APPENDECTOMY  2008   COLON SURGERY  2008   colostomy-infection post op appendectomy   COLOSTOMY REVERSAL  2009   INTERCOSTAL NERVE BLOCK Right 10/19/2020   Procedure: INTERCOSTAL NERVE BLOCK;  Surgeon: [name], MD;  Location: [facility];  Service: Thoracic;  Laterality: Right;   NODE DISSECTION Right 10/19/2020   Procedure: NODE DISSECTION;  Surgeon: [name], MD;  Location: [facility];  Service: Thoracic;  Laterality: Right;   VIDEO  BRONCHOSCOPY WITH ENDOBRONCHIAL NAVIGATION N/A 05/08/2020   Procedure: VIDEO BRONCHOSCOPY WITH ENDOBRONCHIAL NAVIGATION;  Surgeon: [name], MD;  Location: [facility];  Service: Thoracic;  Laterality: N/A;   VIDEO BRONCHOSCOPY WITH ENDOBRONCHIAL ULTRASOUND N/A 05/08/2020   Procedure: VIDEO BRONCHOSCOPY WITH ENDOBRONCHIAL ULTRASOUND;  Surgeon: [name], MD;  Location: [facility];  Service: Thoracic;  Laterality: N/A;    REVIEW OF SYSTEMS:  A comprehensive review of systems was negative except for: Constitutional: positive for fatigue Neurological: positive for paresthesia   PHYSICAL EXAMINATION: General appearance: alert, cooperative, fatigued, and no distress Head: Normocephalic, without obvious abnormality, atraumatic Neck: no adenopathy, no JVD, supple, symmetrical, trachea midline, and thyroid not enlarged, symmetric, no tenderness/mass/nodules Lymph nodes: Cervical, supraclavicular, and axillary nodes normal. Resp: clear to auscultation bilaterally Back: symmetric, no curvature. ROM normal. No CVA tenderness. Cardio: regular rate and rhythm, S1, S2 normal, no murmur, click, rub or gallop GI: soft, non-tender; bowel sounds normal; no masses,  no organomegaly Extremities: extremities normal, atraumatic, no cyanosis or edema  ECOG PERFORMANCE STATUS: 1 - Symptomatic but completely ambulatory  Blood pressure 111/63, pulse 67, temperature (!) 97.4 F (36.3 C), temperature source Tympanic, resp. rate 18, height _0  (1.6 m), weight 117 lb 1.6 oz (53.1 kg), SpO2 99 %.  LABORATORY DATA: Lab Results  Component Value Date   WBC 6.9 06/18/2021   HGB 12.0 06/18/2021   HCT 36.6 06/18/2021   MCV 101.4 (H) 06/18/2021   PLT 216 06/18/2021      Chemistry      Component Value Date/Time   NA 141 05/29/2021 0923   NA 135 10/29/2017 1131   K 3.8 05/29/2021 0923   CL 103 05/29/2021 0923   CO2 28 05/29/2021 0923   BUN 8 05/29/2021 0923   BUN 5 (L) 10/29/2017 1131   CREATININE  0.68 05/29/2021 0923      Component Value Date/Time   CALCIUM 9.7 05/29/2021 0923   ALKPHOS 71 05/29/2021 0923   AST 23 05/29/2021 0923   ALT 15 05/29/2021 0923   BILITOT 0.4 05/29/2021 0923       RADIOGRAPHIC STUDIES: No results found.  ASSESSMENT AND PLAN: This is a very pleasant 70 years old white female recently diagnosed with a stage IIIa (T1b, N2, M0) non-small cell lung cancer, adenocarcinoma presented with right upper lobe lung nodule in addition to mediastinal lymphadenopathy.  PD-L1 expression 90%. The patient had MRI of the brain performed recently that showed no evidence of intracranial metastatic disease. The patient underwent a course of neoadjuvant treatment with carboplatin for AUC of 5, Alimta 500 mg/M2 and Keytruda 200 mg IV every 3 weeks for 3 cycles.   She tolerated this treatment well with no concerning adverse effect except for dry skin and itching. Her repeat CT scan after the neoadjuvant treatment showed partial response with around 50% reduction in the tumor volume. The patient underwent right upper lobectomy with lymph node dissection on October 19, 2020  and the final pathology showed residual tumor measuring 0.5 cm with lymph node metastasis involving hilar and mediastinal lymph nodes. She underwent adjuvant systemic chemotherapy with carboplatin for AUC of 5, Alimta 500 mg/M2 every 3 weeks since she has good response to this treatment in the past.  She is status post 4 cycles.  She tolerated this treatment well with no concerning adverse effect except for mild fatigue. She is currently undergoing adjuvant immunotherapy with Tecentriq 1200 Mg IV every 3 weeks status post 3 cycles.   The patient has been tolerating this treatment well with no concerning adverse effects. I recommended for her to proceed with cycle #4 of her adjuvant therapy with atezolizumab today as planned. I will see her back for follow-up visit in 3 weeks for evaluation before the next cycle of  her treatment. The patient was advised to call immediately if she has any other concerning symptoms in the interval. The patient voices understanding of current disease status and treatment options and is in agreement with the current care plan.  All questions were answered. The patient knows to call the clinic with any problems, questions or concerns. We can certainly see the patient much sooner if necessary.   Disclaimer: This note was dictated with voice recognition software. Similar sounding words can inadvertently be transcribed and may not be corrected upon review.

## 2021-06-18 NOTE — Patient Instructions (Signed)
[name] ONCOLOGY  Discharge Instructions: Thank you for choosing [facility] to provide your oncology and hematology care.   If you have a lab appointment with the [facility], please go directly to the [facility] and check in at the registration area.   Wear comfortable clothing and clothing appropriate for easy access to any Portacath or PICC line.   We strive to give you quality time with your provider. You may need to reschedule your appointment if you arrive late (15 or more minutes).  Arriving late affects you and other patients whose appointments are after yours.  Also, if you miss three or more appointments without notifying the office, you may be dismissed from the clinic at the provider's discretion.      For prescription refill requests, have your pharmacy contact our office and allow 72 hours for refills to be completed.    Today you received the following chemotherapy and/or immunotherapy agents: Tecentriq.      To help prevent nausea and vomiting after your treatment, we encourage you to take your nausea medication as directed.  BELOW ARE SYMPTOMS THAT SHOULD BE REPORTED IMMEDIATELY: *FEVER GREATER THAN 100.4 F (38 C) OR HIGHER *CHILLS OR SWEATING *NAUSEA AND VOMITING THAT IS NOT CONTROLLED WITH YOUR NAUSEA MEDICATION *UNUSUAL SHORTNESS OF BREATH *UNUSUAL BRUISING OR BLEEDING *URINARY PROBLEMS (pain or burning when urinating, or frequent urination) *BOWEL PROBLEMS (unusual diarrhea, constipation, pain near the anus) TENDERNESS IN MOUTH AND THROAT WITH OR WITHOUT PRESENCE OF ULCERS (sore throat, sores in mouth, or a toothache) UNUSUAL RASH, SWELLING OR PAIN  UNUSUAL VAGINAL DISCHARGE OR ITCHING   Items with * indicate a potential emergency and should be followed up as soon as possible or go to the Emergency Department if any problems should occur.  Please show the CHEMOTHERAPY ALERT CARD or IMMUNOTHERAPY ALERT CARD at check-in to  the Emergency Department and triage nurse.  Should you have questions after your visit or need to cancel or reschedule your appointment, please contact [facility]  Dept: [phone]  and follow the prompts.  Office hours are 8:00 a.m. to 4:30 p.m. Monday - Friday. Please note that voicemails left after 4:00 p.m. may not be returned until the following business day.  We are closed weekends and major holidays. You have access to a nurse at all times for urgent questions. Please call the main number to the clinic Dept: [phone] and follow the prompts.   For any non-urgent questions, you may also contact your provider using MyChart. We now offer e-Visits for anyone 2 and older to request care online for non-urgent symptoms. For details visit [email or web address].   Also download the MyChart app! Go to the app store, search "MyChart", open the app, select [facility], and log in with your MyChart username and password.  Due to Covid, a mask is required upon entering the hospital/clinic. If you do not have a mask, one will be given to you upon arrival. For doctor visits, patients may have 1 support person aged 45 or older with them. For treatment visits, patients cannot have anyone with them due to current Covid guidelines and our immunocompromised population.

## 2021-06-26 ENCOUNTER — Other Ambulatory Visit: Payer: Self-pay | Admitting: Internal Medicine

## 2021-06-26 NOTE — Telephone Encounter (Signed)
There were 3 refills place with last order

## 2021-07-02 NOTE — Progress Notes (Signed)
[facility] OFFICE PROGRESS NOTE  [name], [address] [place] [address]  DIAGNOSIS: Stage IIIA (T1b, N2, M0) non-small cell lung cancer, adenocarcinoma presented with right upper lobe lung nodule as well as right upper paratracheal adenopathy and suspicious tiny nodule in the right middle lobe. The patient has PD-L1 expression was 90%  PRIOR THERAPY: 1) Neoadjuvant systemic chemotherapy with carboplatin for AUC of 5, Alimta 500 mg/M2 and Keytruda 200 mg IV every 3 weeks.  First dose 05/30/2020. Status post 3 cycles. 2) status post robotic assisted right thoracoscopy with right upper lobectomy and lymph node dissection under the care of Dr. Roxan Hockey on October 19, 2020.  There was residual tumor measuring 0.5 cm with lymph node metastasis involving 4R, 12 and hilar lymph nodes. 3) Adjuvant systemic chemotherapy with carboplatin for AUC of 5 and Alimta 500 Mg/M2 every 3 weeks.  First dose was given December 11, 2020.  Status post 4 cycles.  Last dose of chemotherapy was given Feb 13, 2021.  CURRENT THERAPY:  Adjuvant immunotherapy with Atezolizumab 1200 Mg IV every 3 weeks.  First dose March 27, 2021.  Status post 4 cycles.  INTERVAL HISTORY: [name] 70 y.o. female returns to the clinic for a follow up visit. The patient is feeling well today without any concerning complaints.  In September 2022, the patient's TSH continue to be high and Dr. Julien Nordmann increase her dose from 50 mcg of Synthroid to 75 mcg.  She did not start taking this until approximately 1 week ago and wanted to make sure we were aware.  The patient continues to tolerate treatment with adjuvant immunotherapy well without any adverse effects. Denies any fever, chills, night sweats, or weight loss. Denies any chest pain, shortness of breath, cough, or hemoptysis. Denies any nausea, vomiting, diarrhea, or unusual constipation besides her baseline constipation for which she will use a  laxative. Denies any headache or visual changes. Denies any rashes or skin changes. The patient is here today for evaluation prior to starting cycle # 5   MEDICAL HISTORY: Past Medical History:  Diagnosis Date   Cataract    COPD (chronic obstructive pulmonary disease) ([facility])     ALLERGIES:  has No Known Allergies.  MEDICATIONS:  Current Outpatient Medications  Medication Sig Dispense Refill   albuterol (PROVENTIL HFA;VENTOLIN HFA) 108 (90 Base) MCG/ACT inhaler Inhale 2 puffs into the lungs every 4 (four) hours as needed for wheezing or shortness of breath (cough, shortness of breath or wheezing.). 1 Inhaler 1   alendronate (FOSAMAX) 70 MG tablet Take 70 mg by mouth once a week.     Calcium Carbonate (CALCIUM 600 PO) Take 600 mg by mouth 2 (two) times daily.      Cholecalciferol (VITAMIN D3 PO) Take 2,000 Units by mouth daily.     fluticasone (FLONASE) 50 MCG/ACT nasal spray Place 2 sprays into both nostrils daily. 16 g 12   Fluticasone-Salmeterol (ADVAIR) 250-50 MCG/DOSE AEPB Inhale 1 puff into the lungs 2 (two) times daily.      guaiFENesin (MUCINEX) 600 MG 12 hr tablet Take 1 tablet (600 mg total) by mouth 2 (two) times daily as needed for cough or to loosen phlegm.     HYDROcodone-acetaminophen (NORCO/VICODIN) 5-325 MG tablet Take 1-2 tablets by mouth every 6 (six) hours as needed.     levothyroxine (SYNTHROID) 75 MCG tablet Take 1 tablet (75 mcg total) by mouth daily before breakfast. 30 tablet 2   Multiple Vitamin (  MULTIVITAMIN) tablet Take 1 tablet by mouth daily.     pregabalin (LYRICA) 75 MG capsule Take 75 mg by mouth 3 (three) times daily.     traZODone (DESYREL) 50 MG tablet Take 25-50 mg by mouth at bedtime.     No current facility-administered medications for this visit.    SURGICAL HISTORY:  Past Surgical History:  Procedure Laterality Date   APPENDECTOMY  2008   COLON SURGERY  2008   colostomy-infection post op appendectomy   COLOSTOMY REVERSAL  2009   INTERCOSTAL  NERVE BLOCK Right 10/19/2020   Procedure: INTERCOSTAL NERVE BLOCK;  Surgeon: [name], MD;  Location: [facility];  Service: Thoracic;  Laterality: Right;   NODE DISSECTION Right 10/19/2020   Procedure: NODE DISSECTION;  Surgeon: [name], MD;  Location: [facility];  Service: Thoracic;  Laterality: Right;   VIDEO BRONCHOSCOPY WITH ENDOBRONCHIAL NAVIGATION N/A 05/08/2020   Procedure: VIDEO BRONCHOSCOPY WITH ENDOBRONCHIAL NAVIGATION;  Surgeon: [name], MD;  Location: [facility];  Service: Thoracic;  Laterality: N/A;   VIDEO BRONCHOSCOPY WITH ENDOBRONCHIAL ULTRASOUND N/A 05/08/2020   Procedure: VIDEO BRONCHOSCOPY WITH ENDOBRONCHIAL ULTRASOUND;  Surgeon: [name], MD;  [facility];  Service: Thoracic;  Laterality: N/A;    REVIEW OF SYSTEMS:   Review of Systems  Constitutional: Negative for appetite change, chills, fatigue, fever and unexpected weight change.  HENT: Negative for mouth sores, nosebleeds, sore throat and trouble swallowing.   Eyes: Negative for eye problems and icterus.  Respiratory: Negative for cough, hemoptysis, shortness of breath and wheezing.   Cardiovascular: Negative for chest pain and leg swelling.  Gastrointestinal: Positive for baseline constipation. Negative for abdominal pain, constipation, diarrhea, nausea and vomiting.  Genitourinary: Negative for bladder incontinence, difficulty urinating, dysuria, frequency and hematuria.   Musculoskeletal: Negative for back pain, gait problem, neck pain and neck stiffness.  Skin: Negative for itching and rash.  Neurological: Negative for dizziness, extremity weakness, gait problem, headaches, light-headedness and seizures.  Hematological: Negative for adenopathy. Does not bruise/bleed easily.  Psychiatric/Behavioral: Negative for confusion, depression and sleep disturbance. The patient is not nervous/anxious.     PHYSICAL EXAMINATION:  Blood pressure 110/63, pulse 88, temperature 97.6 F (36.4  C), temperature source Tympanic, resp. rate 18, weight 118 lb 3 oz (53.6 kg), SpO2 99 %.  ECOG PERFORMANCE STATUS: 1  Physical Exam  Constitutional: Oriented to person, place, and time and well-developed, well-nourished, and in no distress.  HENT:  Head: Normocephalic and atraumatic.  Mouth/Throat: Oropharynx is clear and moist. No oropharyngeal exudate.  Eyes: Conjunctivae are normal. Right eye exhibits no discharge. Left eye exhibits no discharge. No scleral icterus.  Neck: Normal range of motion. Neck supple.  Cardiovascular: Normal rate, regular rhythm, normal heart sounds and intact distal pulses.   Pulmonary/Chest: Effort normal and breath sounds normal. No respiratory distress. No wheezes. No rales.  Abdominal: Soft. Bowel sounds are normal. Exhibits no distension and no mass. There is no tenderness.  Musculoskeletal: Normal range of motion. Exhibits no edema.  Lymphadenopathy:    No cervical adenopathy.  Neurological: Alert and oriented to person, place, and time. Exhibits normal muscle tone. Gait normal. Coordination normal.  Skin: Skin is warm and dry. No rash noted. Not diaphoretic. No erythema. No pallor.  Psychiatric: Mood, memory and judgment normal.  Vitals reviewed.  LABORATORY DATA: Lab Results  Component Value Date   WBC 6.3 07/09/2021   HGB 13.1 07/09/2021   HCT 40.6 07/09/2021   MCV 98.8 07/09/2021  PLT 199 07/09/2021      Chemistry      Component Value Date/Time   NA 137 07/09/2021 0849   NA 135 10/29/2017 1131   K 3.6 07/09/2021 0849   CL 101 07/09/2021 0849   CO2 26 07/09/2021 0849   BUN 11 07/09/2021 0849   BUN 5 (L) 10/29/2017 1131   CREATININE 0.60 07/09/2021 0849      Component Value Date/Time   CALCIUM 9.1 07/09/2021 0849   ALKPHOS 60 07/09/2021 0849   AST 22 07/09/2021 0849   ALT 18 07/09/2021 0849   BILITOT 0.4 07/09/2021 0849       RADIOGRAPHIC STUDIES:  No results found.   ASSESSMENT/PLAN:   This is a very pleasant 70 year  old Caucasian female diagnosed with a stage IIIa (T1b, N2, M0) non-small cell lung cancer, adenocarcinoma presented with right upper lobe lung nodule in addition to mediastinal lymphadenopathy.  PD-L1 expression 90%.  The patient underwent a course of neoadjuvant treatment with carboplatin for AUC of 5, Alimta 500 mg/M2 and Keytruda 200 mg IV every 3 weeks for 3 cycles.   She tolerated this treatment well with no concerning adverse effect except for dry skin and itching.  Her repeat CT scan after the neoadjuvant treatment showed partial response with around 50% reduction in the tumor volume. The patient underwent right upper lobectomy with lymph node dissection on October 19, 2020 and the final pathology showed residual tumor measuring 0.5 cm with lymph node metastasis involving hilar and mediastinal lymph nodes. She underwent adjuvant systemic chemotherapy with carboplatin for AUC of 5, Alimta 500 mg/M2 every 3 weeks since she has good response to this treatment in the past.  She is status post 4 cycles.  She tolerated this treatment well with no concerning adverse effect except for mild fatigue.  She is currently undergoing adjuvant immunotherapy with Tecentriq 1200 Mg IV every 3 weeks status post 4 cycles.     Labs were reviewed. Recommend that he proceed with cycle #5 today as scheduled.   We will see her back for a follow up visit in 3 weeks for evaluation before starting cycle #6.   Since the patient just started taking her 75 mcg of Synthroid about 1 week ago , we will not adjust her dose today unless it is significantly elevated.  We will recheck this and consider dose adjustment in 6 to 8 weeks from the most recent dose adjustment.   The patient was advised to call immediately if he has any concerning symptoms in the interval. The patient voices understanding of current disease status and treatment options and is in agreement with the current care plan. All questions were answered. The  patient knows to call the clinic with any problems, questions or concerns. We can certainly see the patient much sooner if necessary   No orders of the defined types were placed in this encounter.    The total time spent in the appointment was 20-29 minutes.   Montel Vanderhoof L Elaina Cara, PA-C 07/09/21

## 2021-07-09 ENCOUNTER — Inpatient Hospital Stay (HOSPITAL_BASED_OUTPATIENT_CLINIC_OR_DEPARTMENT_OTHER): Payer: Medicare Other | Admitting: Physician Assistant

## 2021-07-09 ENCOUNTER — Inpatient Hospital Stay: Payer: Medicare Other

## 2021-07-09 ENCOUNTER — Other Ambulatory Visit: Payer: Self-pay

## 2021-07-09 VITALS — BP 110/63 | HR 88 | Temp 97.6°F | Resp 18 | Wt 118.2 lb

## 2021-07-09 DIAGNOSIS — Z5112 Encounter for antineoplastic immunotherapy: Secondary | ICD-10-CM | POA: Diagnosis not present

## 2021-07-09 DIAGNOSIS — C3411 Malignant neoplasm of upper lobe, right bronchus or lung: Secondary | ICD-10-CM

## 2021-07-09 LAB — CBC WITH DIFFERENTIAL (CANCER CENTER ONLY)
Abs Immature Granulocytes: 0.02 10*3/uL (ref 0.00–0.07)
Basophils Absolute: 0 10*3/uL (ref 0.0–0.1)
Basophils Relative: 1 %
Eosinophils Absolute: 0.3 10*3/uL (ref 0.0–0.5)
Eosinophils Relative: 5 %
HCT: 40.6 % (ref 36.0–46.0)
Hemoglobin: 13.1 g/dL (ref 12.0–15.0)
Immature Granulocytes: 0 %
Lymphocytes Relative: 27 %
Lymphs Abs: 1.7 10*3/uL (ref 0.7–4.0)
MCH: 31.9 pg (ref 26.0–34.0)
MCHC: 32.3 g/dL (ref 30.0–36.0)
MCV: 98.8 fL (ref 80.0–100.0)
Monocytes Absolute: 0.5 10*3/uL (ref 0.1–1.0)
Monocytes Relative: 8 %
Neutro Abs: 3.7 10*3/uL (ref 1.7–7.7)
Neutrophils Relative %: 59 %
Platelet Count: 199 10*3/uL (ref 150–400)
RBC: 4.11 MIL/uL (ref 3.87–5.11)
RDW: 11.8 % (ref 11.5–15.5)
WBC Count: 6.3 10*3/uL (ref 4.0–10.5)
nRBC: 0 % (ref 0.0–0.2)

## 2021-07-09 LAB — CMP (CANCER CENTER ONLY)
ALT: 18 U/L (ref 0–44)
AST: 22 U/L (ref 15–41)
Albumin: 4.6 g/dL (ref 3.5–5.0)
Alkaline Phosphatase: 60 U/L (ref 38–126)
Anion gap: 10 (ref 5–15)
BUN: 11 mg/dL (ref 8–23)
CO2: 26 mmol/L (ref 22–32)
Calcium: 9.1 mg/dL (ref 8.9–10.3)
Chloride: 101 mmol/L (ref 98–111)
Creatinine: 0.6 mg/dL (ref 0.44–1.00)
GFR, Estimated: >60 mL/min (ref >60)
Glucose, Bld: 76 mg/dL (ref 70–99)
Potassium: 3.6 mmol/L (ref 3.5–5.1)
Sodium: 137 mmol/L (ref 135–145)
Total Bilirubin: 0.4 mg/dL (ref 0.3–1.2)
Total Protein: 7.9 g/dL (ref 6.5–8.1)

## 2021-07-09 LAB — TSH: TSH: 21.029 u[IU]/mL — ABNORMAL HIGH (ref 0.350–4.500)

## 2021-07-09 MED ORDER — SODIUM CHLORIDE 0.9 % IV SOLN
1200.0000 mg | Freq: Once | INTRAVENOUS | Status: AC
  Administered 2021-07-09: 1200 mg via INTRAVENOUS
  Filled 2021-07-09: qty 20

## 2021-07-09 MED ORDER — SODIUM CHLORIDE 0.9 % IV SOLN: Freq: Once | INTRAVENOUS | Status: AC

## 2021-07-09 NOTE — Patient Instructions (Signed)
[facility]  Discharge Instructions: Thank you for choosing [facility] to provide your oncology and hematology care.   If you have a lab appointment with the [facility], please go directly to the [facility] and check in at the registration area.   Wear comfortable clothing and clothing appropriate for easy access to any Portacath or PICC line.   We strive to give you quality time with your provider. You may need to reschedule your appointment if you arrive late (15 or more minutes).  Arriving late affects you and other patients whose appointments are after yours.  Also, if you miss three or more appointments without notifying the office, you may be dismissed from the clinic at the provider's discretion.      For prescription refill requests, have your pharmacy contact our office and allow 72 hours for refills to be completed.    Today you received the following chemotherapy and/or immunotherapy agents: Tecentriq     To help prevent nausea and vomiting after your treatment, we encourage you to take your nausea medication as directed.  BELOW ARE SYMPTOMS THAT SHOULD BE REPORTED IMMEDIATELY: *FEVER GREATER THAN 100.4 F (38 C) OR HIGHER *CHILLS OR SWEATING *NAUSEA AND VOMITING THAT IS NOT CONTROLLED WITH YOUR NAUSEA MEDICATION *UNUSUAL SHORTNESS OF BREATH *UNUSUAL BRUISING OR BLEEDING *URINARY PROBLEMS (pain or burning when urinating, or frequent urination) *BOWEL PROBLEMS (unusual diarrhea, constipation, pain near the anus) TENDERNESS IN MOUTH AND THROAT WITH OR WITHOUT PRESENCE OF ULCERS (sore throat, sores in mouth, or a toothache) UNUSUAL RASH, SWELLING OR PAIN  UNUSUAL VAGINAL DISCHARGE OR ITCHING   Items with * indicate a potential emergency and should be followed up as soon as possible or go to the Emergency Department if any problems should occur.  Please show the CHEMOTHERAPY ALERT CARD or IMMUNOTHERAPY ALERT CARD at check-in to  the Emergency Department and triage nurse.  Should you have questions after your visit or need to cancel or reschedule your appointment, please contact [facility]  Dept: [phone]  and follow the prompts.  Office hours are 8:00 a.m. to 4:30 p.m. Monday - Friday. Please note that voicemails left after 4:00 p.m. may not be returned until the following business day.  We are closed weekends and major holidays. You have access to a nurse at all times for urgent questions. Please call the main number to the clinic Dept: [phone] and follow the prompts.   For any non-urgent questions, you may also contact your provider using MyChart. We now offer e-Visits for anyone 68 and older to request care online for non-urgent symptoms. For details visit [email or web address].   Also download the MyChart app! Go to the app store, search "MyChart", open the app, select [facility], and log in with your MyChart username and password.  Due to Covid, a mask is required upon entering the hospital/clinic. If you do not have a mask, one will be given to you upon arrival. For doctor visits, patients may have 1 support person aged 42 or older with them. For treatment visits, patients cannot have anyone with them due to current Covid guidelines and our immunocompromised population.

## 2021-07-30 ENCOUNTER — Other Ambulatory Visit: Payer: Self-pay

## 2021-07-30 ENCOUNTER — Inpatient Hospital Stay (HOSPITAL_BASED_OUTPATIENT_CLINIC_OR_DEPARTMENT_OTHER): Payer: Medicare Other | Admitting: Internal Medicine

## 2021-07-30 ENCOUNTER — Encounter: Payer: Self-pay | Admitting: Internal Medicine

## 2021-07-30 ENCOUNTER — Inpatient Hospital Stay: Payer: Medicare Other | Attending: Physician Assistant

## 2021-07-30 ENCOUNTER — Inpatient Hospital Stay: Payer: Medicare Other

## 2021-07-30 VITALS — BP 114/59 | HR 73 | Temp 97.2°F | Resp 19 | Ht 63.0 in | Wt 119.7 lb

## 2021-07-30 DIAGNOSIS — C3411 Malignant neoplasm of upper lobe, right bronchus or lung: Secondary | ICD-10-CM

## 2021-07-30 DIAGNOSIS — Z5112 Encounter for antineoplastic immunotherapy: Secondary | ICD-10-CM | POA: Diagnosis present

## 2021-07-30 DIAGNOSIS — C3490 Malignant neoplasm of unspecified part of unspecified bronchus or lung: Secondary | ICD-10-CM

## 2021-07-30 DIAGNOSIS — C771 Secondary and unspecified malignant neoplasm of intrathoracic lymph nodes: Secondary | ICD-10-CM | POA: Insufficient documentation

## 2021-07-30 DIAGNOSIS — Z79899 Other long term (current) drug therapy: Secondary | ICD-10-CM | POA: Insufficient documentation

## 2021-07-30 LAB — CBC WITH DIFFERENTIAL (CANCER CENTER ONLY)
Abs Immature Granulocytes: 0.02 10*3/uL (ref 0.00–0.07)
Basophils Absolute: 0 10*3/uL (ref 0.0–0.1)
Basophils Relative: 1 %
Eosinophils Absolute: 0.1 10*3/uL (ref 0.0–0.5)
Eosinophils Relative: 2 %
HCT: 39 % (ref 36.0–46.0)
Hemoglobin: 12.7 g/dL (ref 12.0–15.0)
Immature Granulocytes: 0 %
Lymphocytes Relative: 28 %
Lymphs Abs: 1.7 10*3/uL (ref 0.7–4.0)
MCH: 31.8 pg (ref 26.0–34.0)
MCHC: 32.6 g/dL (ref 30.0–36.0)
MCV: 97.5 fL (ref 80.0–100.0)
Monocytes Absolute: 0.6 10*3/uL (ref 0.1–1.0)
Monocytes Relative: 10 %
Neutro Abs: 3.6 10*3/uL (ref 1.7–7.7)
Neutrophils Relative %: 59 %
Platelet Count: 215 10*3/uL (ref 150–400)
RBC: 4 MIL/uL (ref 3.87–5.11)
RDW: 11.9 % (ref 11.5–15.5)
WBC Count: 6 10*3/uL (ref 4.0–10.5)
nRBC: 0 % (ref 0.0–0.2)

## 2021-07-30 LAB — CMP (CANCER CENTER ONLY)
ALT: 14 U/L (ref 0–44)
AST: 19 U/L (ref 15–41)
Albumin: 4.2 g/dL (ref 3.5–5.0)
Alkaline Phosphatase: 68 U/L (ref 38–126)
Anion gap: 11 (ref 5–15)
BUN: 14 mg/dL (ref 8–23)
CO2: 27 mmol/L (ref 22–32)
Calcium: 9.7 mg/dL (ref 8.9–10.3)
Chloride: 104 mmol/L (ref 98–111)
Creatinine: 0.7 mg/dL (ref 0.44–1.00)
GFR, Estimated: >60 mL/min (ref >60)
Glucose, Bld: 67 mg/dL — ABNORMAL LOW (ref 70–99)
Potassium: 3.7 mmol/L (ref 3.5–5.1)
Sodium: 142 mmol/L (ref 135–145)
Total Bilirubin: 0.3 mg/dL (ref 0.3–1.2)
Total Protein: 7.5 g/dL (ref 6.5–8.1)

## 2021-07-30 LAB — TSH: TSH: 9.988 u[IU]/mL — ABNORMAL HIGH (ref 0.308–3.960)

## 2021-07-30 MED ORDER — SODIUM CHLORIDE 0.9 % IV SOLN
1200.0000 mg | Freq: Once | INTRAVENOUS | Status: AC
  Administered 2021-07-30: 1200 mg via INTRAVENOUS
  Filled 2021-07-30: qty 20

## 2021-07-30 MED ORDER — SODIUM CHLORIDE 0.9 % IV SOLN: Freq: Once | INTRAVENOUS | Status: AC

## 2021-07-30 NOTE — Patient Instructions (Signed)
[facility]  Discharge Instructions: Thank you for choosing [facility] to provide your oncology and hematology care.   If you have a lab appointment with the [facility], please go directly to the [facility] and check in at the registration area.   Wear comfortable clothing and clothing appropriate for easy access to any Portacath or PICC line.   We strive to give you quality time with your provider. You may need to reschedule your appointment if you arrive late (15 or more minutes).  Arriving late affects you and other patients whose appointments are after yours.  Also, if you miss three or more appointments without notifying the office, you may be dismissed from the clinic at the provider's discretion.      For prescription refill requests, have your pharmacy contact our office and allow 72 hours for refills to be completed.    Today you received the following chemotherapy and/or immunotherapy agents: tecentriq      To help prevent nausea and vomiting after your treatment, we encourage you to take your nausea medication as directed.  BELOW ARE SYMPTOMS THAT SHOULD BE REPORTED IMMEDIATELY: *FEVER GREATER THAN 100.4 F (38 C) OR HIGHER *CHILLS OR SWEATING *NAUSEA AND VOMITING THAT IS NOT CONTROLLED WITH YOUR NAUSEA MEDICATION *UNUSUAL SHORTNESS OF BREATH *UNUSUAL BRUISING OR BLEEDING *URINARY PROBLEMS (pain or burning when urinating, or frequent urination) *BOWEL PROBLEMS (unusual diarrhea, constipation, pain near the anus) TENDERNESS IN MOUTH AND THROAT WITH OR WITHOUT PRESENCE OF ULCERS (sore throat, sores in mouth, or a toothache) UNUSUAL RASH, SWELLING OR PAIN  UNUSUAL VAGINAL DISCHARGE OR ITCHING   Items with * indicate a potential emergency and should be followed up as soon as possible or go to the Emergency Department if any problems should occur.  Please show the CHEMOTHERAPY ALERT CARD or IMMUNOTHERAPY ALERT CARD at check-in to  the Emergency Department and triage nurse.  Should you have questions after your visit or need to cancel or reschedule your appointment, please contact [facility]  Dept: [phone]  and follow the prompts.  Office hours are 8:00 a.m. to 4:30 p.m. Monday - Friday. Please note that voicemails left after 4:00 p.m. may not be returned until the following business day.  We are closed weekends and major holidays. You have access to a nurse at all times for urgent questions. Please call the main number to the clinic Dept: [phone] and follow the prompts.   For any non-urgent questions, you may also contact your provider using MyChart. We now offer e-Visits for anyone 89 and older to request care online for non-urgent symptoms. For details visit [email or web address].   Also download the MyChart app! Go to the app store, search "MyChart", open the app, select [facility], and log in with your MyChart username and password.  Due to Covid, a mask is required upon entering the hospital/clinic. If you do not have a mask, one will be given to you upon arrival. For doctor visits, patients may have 1 support person aged 67 or older with them. For treatment visits, patients cannot have anyone with them due to current Covid guidelines and our immunocompromised population.

## 2021-07-30 NOTE — Progress Notes (Signed)
[facility] [phone]   [phone]  OFFICE PROGRESS NOTE  [name], [place] [address] [place] [address]  DIAGNOSIS: Stage IIIA (T1b, N2, M0) non-small cell lung cancer, adenocarcinoma presented with right upper lobe lung nodule as well as right upper paratracheal adenopathy and suspicious tiny nodule in the right middle lobe. The patient has PD-L1 expression was 90%  PRIOR THERAPY:  1) Neoadjuvant systemic chemotherapy with carboplatin for AUC of 5, Alimta 500 mg/M2 and Keytruda 200 mg IV every 3 weeks.  First dose 05/30/2020. Status post 3 cycles. 2) status post robotic assisted right thoracoscopy with right upper lobectomy and lymph node dissection under the care of Dr. Roxan Hockey on October 19, 2020.  There was residual tumor measuring 0.5 cm with lymph node metastasis involving 4R, 12 and hilar lymph nodes. 3) Adjuvant systemic chemotherapy with carboplatin for AUC of 5 and Alimta 500 Mg/M2 every 3 weeks.  First dose was given December 11, 2020.  Status post 4 cycles.  Last dose of chemotherapy was given Feb 13, 2021.   CURRENT THERAPY: Adjuvant immunotherapy with Atezolizumab 1200 Mg IV every 3 weeks.  First dose March 27, 2021.  Status post 5 cycles.  INTERVAL HISTORY: [name] 70 y.o. female returns to the clinic today for follow-up visit.  The patient is feeling fine today with no concerning complaints except for thinning of her hair.  She denied having any chest pain, shortness of breath, cough or hemoptysis.  She denied having any fever or chills.  She has no nausea, vomiting, diarrhea or constipation.  She has no headache or visual changes.  The patient has mild peripheral neuropathy and she is currently taking Lyrica at nighttime. She continues to tolerate her adjuvant treatment with atezolizumab fairly well.  The patient is here today for evaluation before starting cycle #6.  MEDICAL HISTORY: Past Medical  History:  Diagnosis Date   Cataract    COPD (chronic obstructive pulmonary disease) (HCC)     ALLERGIES:  has No Known Allergies.  MEDICATIONS:  Current Outpatient Medications  Medication Sig Dispense Refill   albuterol (PROVENTIL HFA;VENTOLIN HFA) 108 (90 Base) MCG/ACT inhaler Inhale 2 puffs into the lungs every 4 (four) hours as needed for wheezing or shortness of breath (cough, shortness of breath or wheezing.). 1 Inhaler 1   alendronate (FOSAMAX) 70 MG tablet Take 70 mg by mouth once a week.     Calcium Carbonate (CALCIUM 600 PO) Take 600 mg by mouth 2 (two) times daily.      Cholecalciferol (VITAMIN D3 PO) Take 2,000 Units by mouth daily.     fluticasone (FLONASE) 50 MCG/ACT nasal spray Place 2 sprays into both nostrils daily. 16 g 12   Fluticasone-Salmeterol (ADVAIR) 250-50 MCG/DOSE AEPB Inhale 1 puff into the lungs 2 (two) times daily.      guaiFENesin (MUCINEX) 600 MG 12 hr tablet Take 1 tablet (600 mg total) by mouth 2 (two) times daily as needed for cough or to loosen phlegm.     HYDROcodone-acetaminophen (NORCO/VICODIN) 5-325 MG tablet Take 1-2 tablets by mouth every 6 (six) hours as needed.     levothyroxine (SYNTHROID) 75 MCG tablet Take 1 tablet (75 mcg total) by mouth daily before breakfast. 30 tablet 2   Multiple Vitamin (MULTIVITAMIN) tablet Take 1 tablet by mouth daily.     pregabalin (LYRICA) 75 MG capsule Take 75 mg by mouth 3 (three) times daily.  traZODone (DESYREL) 50 MG tablet Take 25-50 mg by mouth at bedtime.     No current facility-administered medications for this visit.    SURGICAL HISTORY:  Past Surgical History:  Procedure Laterality Date   APPENDECTOMY  2008   COLON SURGERY  2008   colostomy-infection post op appendectomy   COLOSTOMY REVERSAL  2009   INTERCOSTAL NERVE BLOCK Right 10/19/2020   Procedure: INTERCOSTAL NERVE BLOCK;  Surgeon: [name], MD;  Location: [facility];  Service: Thoracic;  Laterality: Right;   NODE DISSECTION Right  10/19/2020   Procedure: NODE DISSECTION;  Surgeon: [name], MD;  Location: [facility];  Service: Thoracic;  Laterality: Right;   VIDEO BRONCHOSCOPY WITH ENDOBRONCHIAL NAVIGATION N/A 05/08/2020   Procedure: VIDEO BRONCHOSCOPY WITH ENDOBRONCHIAL NAVIGATION;  Surgeon: [name], MD;  Location: [facility];  Service: Thoracic;  Laterality: N/A;   VIDEO BRONCHOSCOPY WITH ENDOBRONCHIAL ULTRASOUND N/A 05/08/2020   Procedure: VIDEO BRONCHOSCOPY WITH ENDOBRONCHIAL ULTRASOUND;  Surgeon: [name], MD;  Location: [facility];  Service: Thoracic;  Laterality: N/A;    REVIEW OF SYSTEMS:  A comprehensive review of systems was negative except for: Neurological: positive for paresthesia   PHYSICAL EXAMINATION: General appearance: alert, cooperative, fatigued, and no distress Head: Normocephalic, without obvious abnormality, atraumatic Neck: no adenopathy, no JVD, supple, symmetrical, trachea midline, and thyroid not enlarged, symmetric, no tenderness/mass/nodules Lymph nodes: Cervical, supraclavicular, and axillary nodes normal. Resp: clear to auscultation bilaterally Back: symmetric, no curvature. ROM normal. No CVA tenderness. Cardio: regular rate and rhythm, S1, S2 normal, no murmur, click, rub or gallop GI: soft, non-tender; bowel sounds normal; no masses,  no organomegaly Extremities: extremities normal, atraumatic, no cyanosis or edema  ECOG PERFORMANCE STATUS: 1 - Symptomatic but completely ambulatory  Blood pressure (!) 114/59, pulse 73, temperature (!) 97.2 F (36.2 C), temperature source Tympanic, resp. rate 19, height 5' 3" (1.6 m), weight 119 lb 11.2 oz (54.3 kg), SpO2 98 %.  LABORATORY DATA: Lab Results  Component Value Date   WBC 6.0 07/30/2021   HGB 12.7 07/30/2021   HCT 39.0 07/30/2021   MCV 97.5 07/30/2021   PLT 215 07/30/2021      Chemistry      Component Value Date/Time   NA 137 07/09/2021 0849   NA 135 10/29/2017 1131   K 3.6 07/09/2021 0849   CL 101  07/09/2021 0849   CO2 26 07/09/2021 0849   BUN 11 07/09/2021 0849   BUN 5 (L) 10/29/2017 1131   CREATININE 0.60 07/09/2021 0849      Component Value Date/Time   CALCIUM 9.1 07/09/2021 0849   ALKPHOS 60 07/09/2021 0849   AST 22 07/09/2021 0849   ALT 18 07/09/2021 0849   BILITOT 0.4 07/09/2021 0849       RADIOGRAPHIC STUDIES: No results found.  ASSESSMENT AND PLAN: This is a very pleasant 70 years old white female recently diagnosed with a stage IIIa (T1b, N2, M0) non-small cell lung cancer, adenocarcinoma presented with right upper lobe lung nodule in addition to mediastinal lymphadenopathy.  PD-L1 expression 90%. The patient had MRI of the brain performed recently that showed no evidence of intracranial metastatic disease. The patient underwent a course of neoadjuvant treatment with carboplatin for AUC of 5, Alimta 500 mg/M2 and Keytruda 200 mg IV every 3 weeks for 3 cycles.   She tolerated this treatment well with no concerning adverse effect except for dry skin and itching. Her repeat CT scan after the neoadjuvant treatment showed partial response  with around 50% reduction in the tumor volume. The patient underwent right upper lobectomy with lymph node dissection on October 19, 2020 and the final pathology showed residual tumor measuring 0.5 cm with lymph node metastasis involving hilar and mediastinal lymph nodes. She underwent adjuvant systemic chemotherapy with carboplatin for AUC of 5, Alimta 500 mg/M2 every 3 weeks since she has good response to this treatment in the past.  She is status post 4 cycles.  She tolerated this treatment well with no concerning adverse effect except for mild fatigue. She is currently undergoing adjuvant immunotherapy with Tecentriq 1200 Mg IV every 3 weeks status post 5 cycles.   The patient continues to tolerate this treatment well with no concerning adverse effects. I recommended for her to proceed with cycle #6 today as planned. I will see the  patient back for follow-up visit in 3 weeks for evaluation with repeat CT scan of the chest for restaging of her disease. She was advised to call immediately if she has any concerning symptoms in the interval. The patient voices understanding of current disease status and treatment options and is in agreement with the current care plan.  All questions were answered. The patient knows to call the clinic with any problems, questions or concerns. We can certainly see the patient much sooner if necessary.   Disclaimer: This note was dictated with voice recognition software. Similar sounding words can inadvertently be transcribed and may not be corrected upon review.

## 2021-07-31 ENCOUNTER — Telehealth: Payer: Self-pay | Admitting: Internal Medicine

## 2021-07-31 NOTE — Telephone Encounter (Signed)
Scheduled follow-up appointment per 11/14 los. Patient is aware.

## 2021-08-16 ENCOUNTER — Ambulatory Visit (HOSPITAL_COMMUNITY)
Admission: RE | Admit: 2021-08-16 | Discharge: 2021-08-16 | Disposition: A | Discharge status: Home / Self Care | Payer: Medicare Other | Source: Ambulatory Visit | Attending: Internal Medicine | Admitting: Internal Medicine

## 2021-08-16 DIAGNOSIS — C3490 Malignant neoplasm of unspecified part of unspecified bronchus or lung: Secondary | ICD-10-CM | POA: Diagnosis not present

## 2021-08-16 MED ORDER — IOHEXOL 350 MG/ML SOLN
60.0000 mL | Freq: Once | INTRAVENOUS | Status: AC | PRN
  Administered 2021-08-16: 60 mL via INTRAVENOUS

## 2021-08-20 ENCOUNTER — Inpatient Hospital Stay: Payer: Medicare Other | Attending: Physician Assistant

## 2021-08-20 ENCOUNTER — Other Ambulatory Visit: Payer: Self-pay

## 2021-08-20 ENCOUNTER — Inpatient Hospital Stay: Payer: Medicare Other

## 2021-08-20 ENCOUNTER — Inpatient Hospital Stay (HOSPITAL_BASED_OUTPATIENT_CLINIC_OR_DEPARTMENT_OTHER): Payer: Medicare Other | Admitting: Internal Medicine

## 2021-08-20 ENCOUNTER — Other Ambulatory Visit: Payer: Self-pay | Admitting: Internal Medicine

## 2021-08-20 VITALS — BP 112/66 | HR 64 | Temp 97.1°F | Resp 19 | Ht 63.0 in | Wt 121.6 lb

## 2021-08-20 DIAGNOSIS — Z5112 Encounter for antineoplastic immunotherapy: Secondary | ICD-10-CM | POA: Diagnosis not present

## 2021-08-20 DIAGNOSIS — Z79899 Other long term (current) drug therapy: Secondary | ICD-10-CM | POA: Insufficient documentation

## 2021-08-20 DIAGNOSIS — C771 Secondary and unspecified malignant neoplasm of intrathoracic lymph nodes: Secondary | ICD-10-CM | POA: Insufficient documentation

## 2021-08-20 DIAGNOSIS — C3411 Malignant neoplasm of upper lobe, right bronchus or lung: Secondary | ICD-10-CM

## 2021-08-20 LAB — CBC WITH DIFFERENTIAL (CANCER CENTER ONLY)
Abs Immature Granulocytes: 0.02 10*3/uL (ref 0.00–0.07)
Basophils Absolute: 0 10*3/uL (ref 0.0–0.1)
Basophils Relative: 1 %
Eosinophils Absolute: 0.1 10*3/uL (ref 0.0–0.5)
Eosinophils Relative: 1 %
HCT: 36.2 % (ref 36.0–46.0)
Hemoglobin: 12 g/dL (ref 12.0–15.0)
Immature Granulocytes: 0 %
Lymphocytes Relative: 27 %
Lymphs Abs: 1.7 10*3/uL (ref 0.7–4.0)
MCH: 31.5 pg (ref 26.0–34.0)
MCHC: 33.1 g/dL (ref 30.0–36.0)
MCV: 95 fL (ref 80.0–100.0)
Monocytes Absolute: 0.5 10*3/uL (ref 0.1–1.0)
Monocytes Relative: 8 %
Neutro Abs: 3.9 10*3/uL (ref 1.7–7.7)
Neutrophils Relative %: 63 %
Platelet Count: 209 10*3/uL (ref 150–400)
RBC: 3.81 MIL/uL — ABNORMAL LOW (ref 3.87–5.11)
RDW: 12.2 % (ref 11.5–15.5)
WBC Count: 6.1 10*3/uL (ref 4.0–10.5)
nRBC: 0 % (ref 0.0–0.2)

## 2021-08-20 LAB — CMP (CANCER CENTER ONLY)
ALT: 13 U/L (ref 0–44)
AST: 18 U/L (ref 15–41)
Albumin: 3.9 g/dL (ref 3.5–5.0)
Alkaline Phosphatase: 59 U/L (ref 38–126)
Anion gap: 9 (ref 5–15)
BUN: 10 mg/dL (ref 8–23)
CO2: 27 mmol/L (ref 22–32)
Calcium: 9.2 mg/dL (ref 8.9–10.3)
Chloride: 105 mmol/L (ref 98–111)
Creatinine: 0.67 mg/dL (ref 0.44–1.00)
GFR, Estimated: >60 mL/min (ref >60)
Glucose, Bld: 72 mg/dL (ref 70–99)
Potassium: 3.7 mmol/L (ref 3.5–5.1)
Sodium: 141 mmol/L (ref 135–145)
Total Bilirubin: 0.4 mg/dL (ref 0.3–1.2)
Total Protein: 6.7 g/dL (ref 6.5–8.1)

## 2021-08-20 LAB — TSH: TSH: 5.265 u[IU]/mL — ABNORMAL HIGH (ref 0.308–3.960)

## 2021-08-20 MED ORDER — SODIUM CHLORIDE 0.9 % IV SOLN
1200.0000 mg | Freq: Once | INTRAVENOUS | Status: AC
  Administered 2021-08-20: 1200 mg via INTRAVENOUS
  Filled 2021-08-20: qty 20

## 2021-08-20 MED ORDER — SODIUM CHLORIDE 0.9 % IV SOLN: Freq: Once | INTRAVENOUS | Status: AC

## 2021-08-20 NOTE — Progress Notes (Signed)
[facility] [phone]   [phone]  OFFICE PROGRESS NOTE  [name], [place] [address] [place] [address]  DIAGNOSIS: Stage IIIA (T1b, N2, M0) non-small cell lung cancer, adenocarcinoma presented with right upper lobe lung nodule as well as right upper paratracheal adenopathy and suspicious tiny nodule in the right middle lobe. The patient has PD-L1 expression was 90%  PRIOR THERAPY:  1) Neoadjuvant systemic chemotherapy with carboplatin for AUC of 5, Alimta 500 mg/M2 and Keytruda 200 mg IV every 3 weeks.  First dose 05/30/2020. Status post 3 cycles. 2) status post robotic assisted right thoracoscopy with right upper lobectomy and lymph node dissection under the care of Dr. Roxan Hockey on October 19, 2020.  There was residual tumor measuring 0.5 cm with lymph node metastasis involving 4R, 12 and hilar lymph nodes. 3) Adjuvant systemic chemotherapy with carboplatin for AUC of 5 and Alimta 500 Mg/M2 every 3 weeks.  First dose was given December 11, 2020.  Status post 4 cycles.  Last dose of chemotherapy was given Feb 13, 2021.   CURRENT THERAPY: Adjuvant immunotherapy with Atezolizumab 1200 Mg IV every 3 weeks.  First dose March 27, 2021.  Status post 6 cycles.  INTERVAL HISTORY: [name] 70 y.o. female returns to the clinic today for follow-up visit.  The patient is feeling fine today with no concerning complaints.  She denied having any current chest pain, shortness of breath, cough or hemoptysis.  She denied having any fever or chills.  She has no nausea, vomiting, diarrhea or constipation.  She denied having any headache or visual changes.  She has been tolerating her adjuvant treatment with Tecentriq fairly well.  The patient had repeat CT scan of the chest performed recently and she is here for evaluation and discussion of her scan results.  MEDICAL HISTORY: Past Medical History:  Diagnosis Date   Cataract    COPD  (chronic obstructive pulmonary disease) (HCC)     ALLERGIES:  has No Known Allergies.  MEDICATIONS:  Current Outpatient Medications  Medication Sig Dispense Refill   albuterol (PROVENTIL HFA;VENTOLIN HFA) 108 (90 Base) MCG/ACT inhaler Inhale 2 puffs into the lungs every 4 (four) hours as needed for wheezing or shortness of breath (cough, shortness of breath or wheezing.). (Patient not taking: Reported on 07/30/2021) 1 Inhaler 1   alendronate (FOSAMAX) 70 MG tablet Take 70 mg by mouth once a week.     Calcium Carbonate (CALCIUM 600 PO) Take 600 mg by mouth 2 (two) times daily.      Cholecalciferol (VITAMIN D3 PO) Take 2,000 Units by mouth daily.     fluticasone (FLONASE) 50 MCG/ACT nasal spray Place 2 sprays into both nostrils daily. 16 g 12   Fluticasone-Salmeterol (ADVAIR) 250-50 MCG/DOSE AEPB Inhale 1 puff into the lungs 2 (two) times daily.      guaiFENesin (MUCINEX) 600 MG 12 hr tablet Take 1 tablet (600 mg total) by mouth 2 (two) times daily as needed for cough or to loosen phlegm. (Patient not taking: Reported on 07/30/2021)     HYDROcodone-acetaminophen (NORCO/VICODIN) 5-325 MG tablet Take 1-2 tablets by mouth every 6 (six) hours as needed.     levothyroxine (SYNTHROID) 75 MCG tablet Take 1 tablet (75 mcg total) by mouth daily before breakfast. 30 tablet 2   Multiple Vitamin (MULTIVITAMIN) tablet Take 1 tablet by mouth daily.     pregabalin (LYRICA) 75 MG capsule Take 75 mg by mouth 3 (three)  times daily.     No current facility-administered medications for this visit.    SURGICAL HISTORY:  Past Surgical History:  Procedure Laterality Date   APPENDECTOMY  2008   COLON SURGERY  2008   colostomy-infection post op appendectomy   COLOSTOMY REVERSAL  2009   INTERCOSTAL NERVE BLOCK Right 10/19/2020   Procedure: INTERCOSTAL NERVE BLOCK;  Surgeon: [name], MD;  Location: [facility];  Service: Thoracic;  Laterality: Right;   NODE DISSECTION Right 10/19/2020   Procedure: NODE  DISSECTION;  Surgeon: [name], MD;  Location: [facility];  Service: Thoracic;  Laterality: Right;   VIDEO BRONCHOSCOPY WITH ENDOBRONCHIAL NAVIGATION N/A 05/08/2020   Procedure: VIDEO BRONCHOSCOPY WITH ENDOBRONCHIAL NAVIGATION;  Surgeon: [name], MD;  Location: [facility];  Service: Thoracic;  Laterality: N/A;   VIDEO BRONCHOSCOPY WITH ENDOBRONCHIAL ULTRASOUND N/A 05/08/2020   Procedure: VIDEO BRONCHOSCOPY WITH ENDOBRONCHIAL ULTRASOUND;  Surgeon: [name], MD;  Location: [facility];  Service: Thoracic;  Laterality: N/A;    REVIEW OF SYSTEMS:  Constitutional: negative Eyes: negative Ears, nose, mouth, throat, and face: negative Respiratory: negative Cardiovascular: negative Gastrointestinal: negative Genitourinary:negative Integument/breast: negative Hematologic/lymphatic: negative Musculoskeletal:negative Neurological: negative Behavioral/Psych: negative Endocrine: negative Allergic/Immunologic: negative   PHYSICAL EXAMINATION: General appearance: alert, cooperative, fatigued, and no distress Head: Normocephalic, without obvious abnormality, atraumatic Neck: no adenopathy, no JVD, supple, symmetrical, trachea midline, and thyroid not enlarged, symmetric, no tenderness/mass/nodules Lymph nodes: Cervical, supraclavicular, and axillary nodes normal. Resp: clear to auscultation bilaterally Back: symmetric, no curvature. ROM normal. No CVA tenderness. Cardio: regular rate and rhythm, S1, S2 normal, no murmur, click, rub or gallop GI: soft, non-tender; bowel sounds normal; no masses,  no organomegaly Extremities: extremities normal, atraumatic, no cyanosis or edema Neurologic: Alert and oriented X 3, normal strength and tone. Normal symmetric reflexes. Normal coordination and gait  ECOG PERFORMANCE STATUS: 1 - Symptomatic but completely ambulatory  Blood pressure 112/66, pulse 64, temperature (!) 97.1 F (36.2 C), temperature source Tympanic, resp. rate 19, height  5' 3"  (1.6 m), weight 121 lb 9.6 oz (55.2 kg), SpO2 98 %.  LABORATORY DATA: Lab Results  Component Value Date   WBC 6.1 08/20/2021   HGB 12.0 08/20/2021   HCT 36.2 08/20/2021   MCV 95.0 08/20/2021   PLT 209 08/20/2021      Chemistry      Component Value Date/Time   NA 142 07/30/2021 0913   NA 135 10/29/2017 1131   K 3.7 07/30/2021 0913   CL 104 07/30/2021 0913   CO2 27 07/30/2021 0913   BUN 14 07/30/2021 0913   BUN 5 (L) 10/29/2017 1131   CREATININE 0.70 07/30/2021 0913      Component Value Date/Time   CALCIUM 9.7 07/30/2021 0913   ALKPHOS 68 07/30/2021 0913   AST 19 07/30/2021 0913   ALT 14 07/30/2021 0913   BILITOT 0.3 07/30/2021 0913       RADIOGRAPHIC STUDIES: CT Chest W Contrast  Result Date: 08/16/2021 CLINICAL DATA:  Primary Cancer Type: Lung Imaging Indication: Assess response to therapy Interval therapy since last imaging? Yes Initial Cancer Diagnosis Date: 05/08/2020; Established by: Biopsy-proven Detailed Pathology: Stage IIIA non-small cell lung cancer, adenocarcinoma. Primary Tumor location:  Right upper lobe. Surgeries: Right upper lobectomy 10/19/2020. Chemotherapy: Yes; Ongoing? No; Most recent administration: 02/13/2021 Immunotherapy?  Yes; Type: Atezolizumab; Ongoing? Yes Radiation therapy? No EXAM: CT CHEST WITH CONTRAST TECHNIQUE: Multidetector CT imaging of the chest was performed during intravenous contrast administration. CONTRAST:  61m OMNIPAQUE IOHEXOL  350 MG/ML SOLN COMPARISON:  Most recent CT chest 03/13/2021.  04/06/2020 PET-CT. FINDINGS: Cardiovascular: Calcified coronary artery disease, calcified aortic atherosclerosis, similar to previous imaging. No aneurysmal dilation of the thoracic aorta. Normal heart size without substantial pericardial effusion. Normal caliber of central pulmonary vessels. Distortion of RIGHT hilum following previous RIGHT upper lobectomy. Mediastinum/Nodes: No thoracic inlet, axillary, mediastinal or hilar adenopathy.  Esophagus grossly normal. Lungs/Pleura: Postoperative changes of RIGHT upper lobectomy with distortion of the RIGHT hilum as before. No thickening along suture lines in the RIGHT chest. The LEFT chest is clear. Airways are patent. Upper Abdomen: No acute findings in the upper abdomen. Cholelithiasis. Musculoskeletal: No acute bone finding. No destructive bone process. Spinal degenerative changes. IMPRESSION: Parenchymal distortion at the site of previous RIGHT upper lobectomy with surgical changes about the RIGHT hilum and RIGHT mediastinum. No findings of disease recurrence. Cholelithiasis. Aortic Atherosclerosis (ICD10-I70.0). Electronically Signed   By: Zetta Bills M.D.   On: 08/16/2021 11:18    ASSESSMENT AND PLAN: This is a very pleasant 70 years old white female recently diagnosed with a stage IIIa (T1b, N2, M0) non-small cell lung cancer, adenocarcinoma presented with right upper lobe lung nodule in addition to mediastinal lymphadenopathy.  PD-L1 expression 90%. The patient had MRI of the brain performed recently that showed no evidence of intracranial metastatic disease. The patient underwent a course of neoadjuvant treatment with carboplatin for AUC of 5, Alimta 500 mg/M2 and Keytruda 200 mg IV every 3 weeks for 3 cycles.   She tolerated this treatment well with no concerning adverse effect except for dry skin and itching. Her repeat CT scan after the neoadjuvant treatment showed partial response with around 50% reduction in the tumor volume. The patient underwent right upper lobectomy with lymph node dissection on October 19, 2020 and the final pathology showed residual tumor measuring 0.5 cm with lymph node metastasis involving hilar and mediastinal lymph nodes. She underwent adjuvant systemic chemotherapy with carboplatin for AUC of 5, Alimta 500 mg/M2 every 3 weeks since she has good response to this treatment in the past.  She is status post 4 cycles.  She tolerated this treatment well with  no concerning adverse effect except for mild fatigue. She is currently undergoing adjuvant immunotherapy with Tecentriq 1200 Mg IV every 3 weeks status post 6 cycles.   The patient has been tolerating her adjuvant immunotherapy fairly well with no concerning adverse effects. She had repeat CT scan of the chest performed recently.  I personally and independently reviewed the scans and discussed the results with the patient today. Her scan showed no concerning findings for disease recurrence or metastasis. I recommended for the patient to continue her treatment with adjuvant Tecentriq and she will proceed with cycle #7 today as planned. I will see her back for follow-up visit in 3 weeks for evaluation before the next cycle of her treatment. The patient was advised to call immediately if she has any other concerning symptoms in the interval. The patient voices understanding of current disease status and treatment options and is in agreement with the current care plan.  All questions were answered. The patient knows to call the clinic with any problems, questions or concerns. We can certainly see the patient much sooner if necessary.   Disclaimer: This note was dictated with voice recognition software. Similar sounding words can inadvertently be transcribed and may not be corrected upon review.

## 2021-08-20 NOTE — Patient Instructions (Signed)
[facility]  Discharge Instructions: Thank you for choosing [facility] to provide your oncology and hematology care.   If you have a lab appointment with the [facility], please go directly to the [facility] and check in at the registration area.   Wear comfortable clothing and clothing appropriate for easy access to any Portacath or PICC line.   We strive to give you quality time with your provider. You may need to reschedule your appointment if you arrive late (15 or more minutes).  Arriving late affects you and other patients whose appointments are after yours.  Also, if you miss three or more appointments without notifying the office, you may be dismissed from the clinic at the provider's discretion.      For prescription refill requests, have your pharmacy contact our office and allow 72 hours for refills to be completed.    Today you received the following chemotherapy and/or immunotherapy agent: Atezolizumab (Tecentriq).    To help prevent nausea and vomiting after your treatment, we encourage you to take your nausea medication as directed.  BELOW ARE SYMPTOMS THAT SHOULD BE REPORTED IMMEDIATELY: *FEVER GREATER THAN 100.4 F (38 C) OR HIGHER *CHILLS OR SWEATING *NAUSEA AND VOMITING THAT IS NOT CONTROLLED WITH YOUR NAUSEA MEDICATION *UNUSUAL SHORTNESS OF BREATH *UNUSUAL BRUISING OR BLEEDING *URINARY PROBLEMS (pain or burning when urinating, or frequent urination) *BOWEL PROBLEMS (unusual diarrhea, constipation, pain near the anus) TENDERNESS IN MOUTH AND THROAT WITH OR WITHOUT PRESENCE OF ULCERS (sore throat, sores in mouth, or a toothache) UNUSUAL RASH, SWELLING OR PAIN  UNUSUAL VAGINAL DISCHARGE OR ITCHING   Items with * indicate a potential emergency and should be followed up as soon as possible or go to the Emergency Department if any problems should occur.  Please show the CHEMOTHERAPY ALERT CARD or IMMUNOTHERAPY ALERT CARD at  check-in to the Emergency Department and triage nurse.  Should you have questions after your visit or need to cancel or reschedule your appointment, please contact [facility]  Dept: [phone]  and follow the prompts.  Office hours are 8:00 a.m. to 4:30 p.m. Monday - Friday. Please note that voicemails left after 4:00 p.m. may not be returned until the following business day.  We are closed weekends and major holidays. You have access to a nurse at all times for urgent questions. Please call the main number to the clinic Dept: [phone] and follow the prompts.   For any non-urgent questions, you may also contact your provider using MyChart. We now offer e-Visits for anyone 71 and older to request care online for non-urgent symptoms. For details visit [email or web address].   Also download the MyChart app! Go to the app store, search "MyChart", open the app, select [facility], and log in with your MyChart username and password.  Due to Covid, a mask is required upon entering the hospital/clinic. If you do not have a mask, one will be given to you upon arrival. For doctor visits, patients may have 1 support person aged 71 or older with them. For treatment visits, patients cannot have anyone with them due to current Covid guidelines and our immunocompromised population.

## 2021-09-04 NOTE — Progress Notes (Signed)
[name]  [name], [address] [place] [address]  DIAGNOSIS: Stage IIIA (T1b, N2, M0) non-small cell lung cancer, adenocarcinoma presented with right upper lobe lung nodule as well as right upper paratracheal adenopathy and suspicious tiny nodule in the right middle lobe. The patient has PD-L1 expression was 90%  PRIOR THERAPY: 1) Neoadjuvant systemic chemotherapy with carboplatin for AUC of 5, Alimta 500 mg/M2 and Keytruda 200 mg IV every 3 weeks.  First dose 05/30/2020. Status post 3 cycles. 2) status post robotic assisted right thoracoscopy with right upper lobectomy and lymph node dissection under the care of Dr. Roxan Hockey on October 19, 2020.  There was residual tumor measuring 0.5 cm with lymph node metastasis involving 4R, 12 and hilar lymph nodes. 3) Adjuvant systemic chemotherapy with carboplatin for AUC of 5 and Alimta 500 Mg/M2 every 3 weeks.  First dose was given December 11, 2020.  Status post 4 cycles.  Last dose of chemotherapy was given Feb 13, 2021.    CURRENT THERAPY: Adjuvant immunotherapy with Atezolizumab 1200 Mg IV every 3 weeks.  First dose March 27, 2021.  Status post 7 cycles.  INTERVAL HISTORY: [name] 70 y.o. female returns to the clinic for a follow up visit. The patient is feeling well today without any concerning complaints. The patient continues to tolerate treatment with adjuvant immunotherapy well without any adverse effects. Denies any fever, chills, night sweats, or weight loss. Denies any chest pain, shortness of breath, cough, or hemoptysis. Denies any nausea, vomiting, diarrhea, or unusual constipation. Denies any headache or visual changes. Denies any rashes or skin changes. We are monitoring her TSH closely. She notes for her back pain she has stopped taking gabapentin and now only takes 1 tylenol arthritis before bedtime to help her sleep due to the back pain. The patient is here  today for evaluation prior to starting cycle # 8    MEDICAL HISTORY: Past Medical History:  Diagnosis Date   Cataract    COPD (chronic obstructive pulmonary disease) ([facility])     ALLERGIES:  has No Known Allergies.  MEDICATIONS:  Current Outpatient Medications  Medication Sig Dispense Refill   Acetaminophen (TYLENOL ARTHRITIS EXT RELIEF PO) Take 1 capsule by mouth daily as needed.     albuterol (PROVENTIL HFA;VENTOLIN HFA) 108 (90 Base) MCG/ACT inhaler Inhale 2 puffs into the lungs every 4 (four) hours as needed for wheezing or shortness of breath (cough, shortness of breath or wheezing.). 1 Inhaler 1   alendronate (FOSAMAX) 70 MG tablet Take 70 mg by mouth once a week.     Calcium Carbonate (CALCIUM 600 PO) Take 600 mg by mouth 2 (two) times daily.      Cholecalciferol (VITAMIN D3 PO) Take 2,000 Units by mouth daily.     fluticasone (FLONASE) 50 MCG/ACT nasal spray Place 2 sprays into both nostrils daily. 16 g 12   Fluticasone-Salmeterol (ADVAIR) 250-50 MCG/DOSE AEPB Inhale 1 puff into the lungs 2 (two) times daily.      HYDROcodone-acetaminophen (NORCO/VICODIN) 5-325 MG tablet Take 1-2 tablets by mouth every 6 (six) hours as needed.     levothyroxine (SYNTHROID) 75 MCG tablet TAKE 1 TABLET(75 MCG) BY MOUTH DAILY BEFORE BREAKFAST 30 tablet 2   Multiple Vitamin (MULTIVITAMIN) tablet Take 1 tablet by mouth daily.     guaiFENesin (MUCINEX) 600 MG 12 hr tablet Take 1 tablet (600 mg total) by mouth 2 (two) times daily as needed for cough  or to loosen phlegm. (Patient not taking: Reported on 07/30/2021)     No current facility-administered medications for this visit.    SURGICAL HISTORY:  Past Surgical History:  Procedure Laterality Date   APPENDECTOMY  2008   COLON SURGERY  2008   colostomy-infection post op appendectomy   COLOSTOMY REVERSAL  2009   INTERCOSTAL NERVE BLOCK Right 10/19/2020   Procedure: INTERCOSTAL NERVE BLOCK;  Surgeon: [name], MD;  Location: [facility];   Service: Thoracic;  Laterality: Right;   NODE DISSECTION Right 10/19/2020   Procedure: NODE DISSECTION;  Surgeon: [name], MD;  Location: [facility];  Service: Thoracic;  Laterality: Right;   VIDEO BRONCHOSCOPY WITH ENDOBRONCHIAL NAVIGATION N/A 05/08/2020   Procedure: VIDEO BRONCHOSCOPY WITH ENDOBRONCHIAL NAVIGATION;  Surgeon: [name], MD;  Location: [facility];  Service: Thoracic;  Laterality: N/A;   VIDEO BRONCHOSCOPY WITH ENDOBRONCHIAL ULTRASOUND N/A 05/08/2020   Procedure: VIDEO BRONCHOSCOPY WITH ENDOBRONCHIAL ULTRASOUND;  Surgeon: [name], MD;  Location: [facility];  Service: Thoracic;  Laterality: N/A;    REVIEW OF SYSTEMS:   Review of Systems  Constitutional: Negative for appetite change, chills, fatigue, fever and unexpected weight change.  HENT:   Negative for mouth sores, nosebleeds, sore throat and trouble swallowing.   Eyes: Negative for eye problems and icterus.  Respiratory: Negative for cough, hemoptysis, shortness of breath and wheezing.   Cardiovascular: Negative for chest pain and leg swelling.  Gastrointestinal: Negative for abdominal pain, constipation, diarrhea, nausea and vomiting.  Genitourinary: Negative for bladder incontinence, difficulty urinating, dysuria, frequency and hematuria.   Musculoskeletal: Positive for back pain. Negative for gait problem, neck pain and neck stiffness.  Skin: Negative for itching and rash.  Neurological: Negative for dizziness, extremity weakness, gait problem, headaches, light-headedness and seizures.  Hematological: Negative for adenopathy. Does not bruise/bleed easily.  Psychiatric/Behavioral: Negative for confusion, depression and sleep disturbance. The patient is not nervous/anxious.     PHYSICAL EXAMINATION:  Blood pressure 112/66, pulse 70, temperature 97.9 F (36.6 C), temperature source Tympanic, resp. rate 18, height _0  (1.6 m), weight 121 lb 1.6 oz (54.9 kg), SpO2 100 %.  ECOG PERFORMANCE STATUS:  1  Physical Exam  Constitutional: Oriented to person, place, and time and thin appearing female and in no distress.  HENT:  Head: Normocephalic and atraumatic.  Mouth/Throat: Oropharynx is clear and moist. No oropharyngeal exudate.  Eyes: Conjunctivae are normal. Right eye exhibits no discharge. Left eye exhibits no discharge. No scleral icterus.  Neck: Normal range of motion. Neck supple.  Cardiovascular: Normal rate, regular rhythm, normal heart sounds and intact distal pulses.   Pulmonary/Chest: Effort normal and breath sounds normal. No respiratory distress. No wheezes. No rales.  Abdominal: Soft. Bowel sounds are normal. Exhibits no distension and no mass. There is no tenderness.  Musculoskeletal: Normal range of motion. Exhibits no edema.  Lymphadenopathy:    No cervical adenopathy.  Neurological: Alert and oriented to person, place, and time. Exhibits normal muscle tone. Gait normal. Coordination normal.  Skin: Skin is warm and dry. No rash noted. Not diaphoretic. No erythema. No pallor.  Psychiatric: Mood, memory and judgment normal.  Vitals reviewed.  LABORATORY DATA: Lab Results  Component Value Date   WBC 7.2 09/11/2021   HGB 11.9 (L) 09/11/2021   HCT 36.7 09/11/2021   MCV 95.3 09/11/2021   PLT 212 09/11/2021      Chemistry      Component Value Date/Time   NA 139 09/11/2021 0953  NA 135 10/29/2017 1131   K 3.9 09/11/2021 0953   CL 104 09/11/2021 0953   CO2 28 09/11/2021 0953   BUN 8 09/11/2021 0953   BUN 5 (L) 10/29/2017 1131   CREATININE 0.54 09/11/2021 0953      Component Value Date/Time   CALCIUM 9.4 09/11/2021 0953   ALKPHOS 47 09/11/2021 0953   AST 19 09/11/2021 0953   ALT 15 09/11/2021 0953   BILITOT 0.3 09/11/2021 0953       RADIOGRAPHIC STUDIES:  CT Chest W Contrast  Result Date: 08/16/2021 CLINICAL DATA:  Primary Cancer Type: Lung Imaging Indication: Assess response to therapy Interval therapy since last imaging? Yes Initial Cancer  Diagnosis Date: 05/08/2020; Established by: Biopsy-proven Detailed Pathology: Stage IIIA non-small cell lung cancer, adenocarcinoma. Primary Tumor location:  Right upper lobe. Surgeries: Right upper lobectomy 10/19/2020. Chemotherapy: Yes; Ongoing? No; Most recent administration: 02/13/2021 Immunotherapy?  Yes; Type: Atezolizumab; Ongoing? Yes Radiation therapy? No EXAM: CT CHEST WITH CONTRAST TECHNIQUE: Multidetector CT imaging of the chest was performed during intravenous contrast administration. CONTRAST:  25m OMNIPAQUE IOHEXOL 350 MG/ML SOLN COMPARISON:  Most recent CT chest 03/13/2021.  04/06/2020 PET-CT. FINDINGS: Cardiovascular: Calcified coronary artery disease, calcified aortic atherosclerosis, similar to previous imaging. No aneurysmal dilation of the thoracic aorta. Normal heart size without substantial pericardial effusion. Normal caliber of central pulmonary vessels. Distortion of RIGHT hilum following previous RIGHT upper lobectomy. Mediastinum/Nodes: No thoracic inlet, axillary, mediastinal or hilar adenopathy. Esophagus grossly normal. Lungs/Pleura: Postoperative changes of RIGHT upper lobectomy with distortion of the RIGHT hilum as before. No thickening along suture lines in the RIGHT chest. The LEFT chest is clear. Airways are patent. Upper Abdomen: No acute findings in the upper abdomen. Cholelithiasis. Musculoskeletal: No acute bone finding. No destructive bone process. Spinal degenerative changes. IMPRESSION: Parenchymal distortion at the site of previous RIGHT upper lobectomy with surgical changes about the RIGHT hilum and RIGHT mediastinum. No findings of disease recurrence. Cholelithiasis. Aortic Atherosclerosis (ICD10-I70.0). Electronically Signed   By: GZetta BillsM.D.   On: 08/16/2021 11:18     ASSESSMENT/PLAN:  This is a very pleasant 70year old Caucasian female diagnosed with a stage IIIa (T1b, N2, M0) non-small cell lung cancer, adenocarcinoma presented with right upper lobe  lung nodule in addition to mediastinal lymphadenopathy.  PD-L1 expression 90%.   The patient underwent a course of neoadjuvant treatment with carboplatin for AUC of 5, Alimta 500 mg/M2 and Keytruda 200 mg IV every 3 weeks for 3 cycles.   She tolerated this treatment well with no concerning adverse effect except for dry skin and itching.   Her repeat CT scan after the neoadjuvant treatment showed partial response with around 50% reduction in the tumor volume. The patient underwent right upper lobectomy with lymph node dissection on October 19, 2020 and the final pathology showed residual tumor measuring 0.5 cm with lymph node metastasis involving hilar and mediastinal lymph nodes. She underwent adjuvant systemic chemotherapy with carboplatin for AUC of 5, Alimta 500 mg/M2 every 3 weeks since she has good response to this treatment in the past.  She is status post 4 cycles.  She tolerated this treatment well with no concerning adverse effect except for mild fatigue.  She is currently undergoing adjuvant immunotherapy with Tecentriq 1200 Mg IV every 3 weeks status post 7 cycles.     Labs were reviewed. Recommend that he proceed with cycle #8 today as scheduled.    We will see her back for a follow up visit in  3 weeks for evaluation before starting cycle #9.    We will monitor her TSH closely and make dose adjustments if necessary. Her labs are pending from today.   The patient was advised to call immediately if she has any concerning symptoms in the interval. The patient voices understanding of current disease status and treatment options and is in agreement with the current care plan. All questions were answered. The patient knows to call the clinic with any problems, questions or concerns. We can certainly see the patient much sooner if necessary        No orders of the defined types were placed in this encounter.    The total time spent in the appointment was 20-29 minute.   Vonne Mcdanel  L Demecia Northway, PA-C 09/11/21

## 2021-09-07 ENCOUNTER — Other Ambulatory Visit: Payer: Self-pay | Admitting: Internal Medicine

## 2021-09-11 ENCOUNTER — Encounter: Payer: Self-pay | Admitting: Physician Assistant

## 2021-09-11 ENCOUNTER — Ambulatory Visit: Payer: Medicare Other

## 2021-09-11 ENCOUNTER — Inpatient Hospital Stay: Payer: Medicare Other

## 2021-09-11 ENCOUNTER — Inpatient Hospital Stay (HOSPITAL_BASED_OUTPATIENT_CLINIC_OR_DEPARTMENT_OTHER): Payer: Medicare Other | Admitting: Physician Assistant

## 2021-09-11 ENCOUNTER — Other Ambulatory Visit: Payer: Self-pay

## 2021-09-11 VITALS — BP 112/66 | HR 70 | Temp 97.9°F | Resp 18 | Ht 63.0 in | Wt 121.1 lb

## 2021-09-11 DIAGNOSIS — C3411 Malignant neoplasm of upper lobe, right bronchus or lung: Secondary | ICD-10-CM | POA: Diagnosis not present

## 2021-09-11 DIAGNOSIS — Z5112 Encounter for antineoplastic immunotherapy: Secondary | ICD-10-CM | POA: Diagnosis not present

## 2021-09-11 LAB — CBC WITH DIFFERENTIAL (CANCER CENTER ONLY)
Abs Immature Granulocytes: 0.02 10*3/uL (ref 0.00–0.07)
Basophils Absolute: 0 10*3/uL (ref 0.0–0.1)
Basophils Relative: 1 %
Eosinophils Absolute: 0.1 10*3/uL (ref 0.0–0.5)
Eosinophils Relative: 1 %
HCT: 36.7 % (ref 36.0–46.0)
Hemoglobin: 11.9 g/dL — ABNORMAL LOW (ref 12.0–15.0)
Immature Granulocytes: 0 %
Lymphocytes Relative: 20 %
Lymphs Abs: 1.4 10*3/uL (ref 0.7–4.0)
MCH: 30.9 pg (ref 26.0–34.0)
MCHC: 32.4 g/dL (ref 30.0–36.0)
MCV: 95.3 fL (ref 80.0–100.0)
Monocytes Absolute: 0.6 10*3/uL (ref 0.1–1.0)
Monocytes Relative: 8 %
Neutro Abs: 5.1 10*3/uL (ref 1.7–7.7)
Neutrophils Relative %: 70 %
Platelet Count: 212 10*3/uL (ref 150–400)
RBC: 3.85 MIL/uL — ABNORMAL LOW (ref 3.87–5.11)
RDW: 12.6 % (ref 11.5–15.5)
WBC Count: 7.2 10*3/uL (ref 4.0–10.5)
nRBC: 0 % (ref 0.0–0.2)

## 2021-09-11 LAB — CMP (CANCER CENTER ONLY)
ALT: 15 U/L (ref 0–44)
AST: 19 U/L (ref 15–41)
Albumin: 4.2 g/dL (ref 3.5–5.0)
Alkaline Phosphatase: 47 U/L (ref 38–126)
Anion gap: 7 (ref 5–15)
BUN: 8 mg/dL (ref 8–23)
CO2: 28 mmol/L (ref 22–32)
Calcium: 9.4 mg/dL (ref 8.9–10.3)
Chloride: 104 mmol/L (ref 98–111)
Creatinine: 0.54 mg/dL (ref 0.44–1.00)
GFR, Estimated: >60 mL/min (ref >60)
Glucose, Bld: 82 mg/dL (ref 70–99)
Potassium: 3.9 mmol/L (ref 3.5–5.1)
Sodium: 139 mmol/L (ref 135–145)
Total Bilirubin: 0.3 mg/dL (ref 0.3–1.2)
Total Protein: 6.6 g/dL (ref 6.5–8.1)

## 2021-09-11 LAB — TSH: TSH: 8.431 u[IU]/mL — ABNORMAL HIGH (ref 0.308–3.960)

## 2021-09-11 MED ORDER — SODIUM CHLORIDE 0.9 % IV SOLN: Freq: Once | INTRAVENOUS | Status: AC

## 2021-09-11 MED ORDER — SODIUM CHLORIDE 0.9 % IV SOLN
1200.0000 mg | Freq: Once | INTRAVENOUS | Status: AC
  Administered 2021-09-11: 12:00:00 1200 mg via INTRAVENOUS
  Filled 2021-09-11: qty 20

## 2021-09-11 NOTE — Patient Instructions (Signed)
[name]  Discharge Instructions: Thank you for choosing [facility] to provide your [facility] and hematology care.   If you have a lab appointment with the [facility], please go directly to the [facility] and check in at the registration area.   Wear comfortable clothing and clothing appropriate for easy access to any Portacath or PICC line.   We strive to give you quality time with your provider. You may need to reschedule your appointment if you arrive late (15 or more minutes).  Arriving late affects you and other patients whose appointments are after yours.  Also, if you miss three or more appointments without notifying the office, you may be dismissed from the clinic at the providers discretion.      For prescription refill requests, have your pharmacy contact our office and allow 72 hours for refills to be completed.    Today you received the following chemotherapy and/or immunotherapy agent: Atezolizumab (Tecentriq).    To help prevent nausea and vomiting after your treatment, we encourage you to take your nausea medication as directed.  BELOW ARE SYMPTOMS THAT SHOULD BE REPORTED IMMEDIATELY: *FEVER GREATER THAN 100.4 F (38 C) OR HIGHER *CHILLS OR SWEATING *NAUSEA AND VOMITING THAT IS NOT CONTROLLED WITH YOUR NAUSEA MEDICATION *UNUSUAL SHORTNESS OF BREATH *UNUSUAL BRUISING OR BLEEDING *URINARY PROBLEMS (pain or burning when urinating, or frequent urination) *BOWEL PROBLEMS (unusual diarrhea, constipation, pain near the anus) TENDERNESS IN MOUTH AND THROAT WITH OR WITHOUT PRESENCE OF ULCERS (sore throat, sores in mouth, or a toothache) UNUSUAL RASH, SWELLING OR PAIN  UNUSUAL VAGINAL DISCHARGE OR ITCHING   Items with * indicate a potential emergency and should be followed up as soon as possible or go to the Emergency Department if any problems should occur.  Please show the CHEMOTHERAPY ALERT CARD or IMMUNOTHERAPY ALERT CARD at  check-in to the Emergency Department and triage nurse.  Should you have questions after your visit or need to cancel or reschedule your appointment, please contact [facility]  Dept: [phone]  and follow the prompts.  Office hours are 8:00 a.m. to 4:30 p.m. Monday - Friday. Please note that voicemails left after 4:00 p.m. may not be returned until the following business day.  We are closed weekends and major holidays. You have access to a nurse at all times for urgent questions. Please call the main number to the clinic Dept: [phone] and follow the prompts.   For any non-urgent questions, you may also contact your provider using MyChart. We now offer e-Visits for anyone 58 and older to request care online for non-urgent symptoms. For details visit [email or web address].   Also download the MyChart app! Go to the app store, search "MyChart", open the app, select [facility], and log in with your MyChart username and password.  Due to Covid, a mask is required upon entering the hospital/clinic. If you do not have a mask, one will be given to you upon arrival. For doctor visits, patients may have 1 support person aged 25 or older with them. For treatment visits, patients cannot have anyone with them due to current Covid guidelines and our immunocompromised population.

## 2021-10-01 ENCOUNTER — Other Ambulatory Visit: Payer: Self-pay

## 2021-10-01 ENCOUNTER — Inpatient Hospital Stay: Payer: Medicare Other

## 2021-10-01 ENCOUNTER — Encounter: Payer: Self-pay | Admitting: Internal Medicine

## 2021-10-01 ENCOUNTER — Inpatient Hospital Stay: Payer: Medicare Other | Attending: Physician Assistant

## 2021-10-01 ENCOUNTER — Inpatient Hospital Stay: Payer: Medicare Other | Admitting: Internal Medicine

## 2021-10-01 VITALS — BP 127/57 | HR 65 | Temp 97.7°F | Resp 18 | Ht 63.0 in | Wt 120.9 lb

## 2021-10-01 DIAGNOSIS — C3411 Malignant neoplasm of upper lobe, right bronchus or lung: Secondary | ICD-10-CM | POA: Insufficient documentation

## 2021-10-01 DIAGNOSIS — Z79899 Other long term (current) drug therapy: Secondary | ICD-10-CM | POA: Insufficient documentation

## 2021-10-01 DIAGNOSIS — Z5112 Encounter for antineoplastic immunotherapy: Secondary | ICD-10-CM | POA: Insufficient documentation

## 2021-10-01 DIAGNOSIS — C771 Secondary and unspecified malignant neoplasm of intrathoracic lymph nodes: Secondary | ICD-10-CM | POA: Diagnosis not present

## 2021-10-01 LAB — CMP (CANCER CENTER ONLY)
ALT: 12 U/L (ref 0–44)
AST: 19 U/L (ref 15–41)
Albumin: 4.3 g/dL (ref 3.5–5.0)
Alkaline Phosphatase: 44 U/L (ref 38–126)
Anion gap: 9 (ref 5–15)
BUN: 9 mg/dL (ref 8–23)
CO2: 26 mmol/L (ref 22–32)
Calcium: 9.1 mg/dL (ref 8.9–10.3)
Chloride: 104 mmol/L (ref 98–111)
Creatinine: 0.55 mg/dL (ref 0.44–1.00)
GFR, Estimated: >60 mL/min (ref >60)
Glucose, Bld: 69 mg/dL — ABNORMAL LOW (ref 70–99)
Potassium: 3.5 mmol/L (ref 3.5–5.1)
Sodium: 139 mmol/L (ref 135–145)
Total Bilirubin: 0.4 mg/dL (ref 0.3–1.2)
Total Protein: 7 g/dL (ref 6.5–8.1)

## 2021-10-01 LAB — CBC WITH DIFFERENTIAL (CANCER CENTER ONLY)
Abs Immature Granulocytes: 0.03 10*3/uL (ref 0.00–0.07)
Basophils Absolute: 0 10*3/uL (ref 0.0–0.1)
Basophils Relative: 1 %
Eosinophils Absolute: 0.1 10*3/uL (ref 0.0–0.5)
Eosinophils Relative: 1 %
HCT: 37.3 % (ref 36.0–46.0)
Hemoglobin: 12.1 g/dL (ref 12.0–15.0)
Immature Granulocytes: 0 %
Lymphocytes Relative: 20 %
Lymphs Abs: 1.6 10*3/uL (ref 0.7–4.0)
MCH: 31 pg (ref 26.0–34.0)
MCHC: 32.4 g/dL (ref 30.0–36.0)
MCV: 95.6 fL (ref 80.0–100.0)
Monocytes Absolute: 0.6 10*3/uL (ref 0.1–1.0)
Monocytes Relative: 7 %
Neutro Abs: 5.8 10*3/uL (ref 1.7–7.7)
Neutrophils Relative %: 71 %
Platelet Count: 227 10*3/uL (ref 150–400)
RBC: 3.9 MIL/uL (ref 3.87–5.11)
RDW: 12.7 % (ref 11.5–15.5)
WBC Count: 8.1 10*3/uL (ref 4.0–10.5)
nRBC: 0 % (ref 0.0–0.2)

## 2021-10-01 LAB — TSH: TSH: 3.479 u[IU]/mL (ref 0.308–3.960)

## 2021-10-01 MED ORDER — SODIUM CHLORIDE 0.9 % IV SOLN
1200.0000 mg | Freq: Once | INTRAVENOUS | Status: AC
  Administered 2021-10-01: 1200 mg via INTRAVENOUS
  Filled 2021-10-01: qty 20

## 2021-10-01 MED ORDER — SODIUM CHLORIDE 0.9 % IV SOLN: Freq: Once | INTRAVENOUS | Status: AC

## 2021-10-01 NOTE — Patient Instructions (Signed)
[facility]  Discharge Instructions: Thank you for choosing [facility] to provide your oncology and hematology care.   If you have a lab appointment with the [facility], please go directly to the [facility] and check in at the registration area.   Wear comfortable clothing and clothing appropriate for easy access to any Portacath or PICC line.   We strive to give you quality time with your provider. You may need to reschedule your appointment if you arrive late (15 or more minutes).  Arriving late affects you and other patients whose appointments are after yours.  Also, if you miss three or more appointments without notifying the office, you may be dismissed from the clinic at the providers discretion.      For prescription refill requests, have your pharmacy contact our office and allow 72 hours for refills to be completed.    Today you received the following chemotherapy and/or immunotherapy agent: Atezolizumab (Tecentriq).    To help prevent nausea and vomiting after your treatment, we encourage you to take your nausea medication as directed.  BELOW ARE SYMPTOMS THAT SHOULD BE REPORTED IMMEDIATELY: *FEVER GREATER THAN 100.4 F (38 C) OR HIGHER *CHILLS OR SWEATING *NAUSEA AND VOMITING THAT IS NOT CONTROLLED WITH YOUR NAUSEA MEDICATION *UNUSUAL SHORTNESS OF BREATH *UNUSUAL BRUISING OR BLEEDING *URINARY PROBLEMS (pain or burning when urinating, or frequent urination) *BOWEL PROBLEMS (unusual diarrhea, constipation, pain near the anus) TENDERNESS IN MOUTH AND THROAT WITH OR WITHOUT PRESENCE OF ULCERS (sore throat, sores in mouth, or a toothache) UNUSUAL RASH, SWELLING OR PAIN  UNUSUAL VAGINAL DISCHARGE OR ITCHING   Items with * indicate a potential emergency and should be followed up as soon as possible or go to the Emergency Department if any problems should occur.  Please show the CHEMOTHERAPY ALERT CARD or IMMUNOTHERAPY ALERT CARD at  check-in to the Emergency Department and triage nurse.  Should you have questions after your visit or need to cancel or reschedule your appointment, please contact [facility]  Dept: [phone]  and follow the prompts.  Office hours are 8:00 a.m. to 4:30 p.m. Monday - Friday. Please note that voicemails left after 4:00 p.m. may not be returned until the following business day.  We are closed weekends and major holidays. You have access to a nurse at all times for urgent questions. Please call the main number to the clinic Dept: [phone] and follow the prompts.   For any non-urgent questions, you may also contact your provider using MyChart. We now offer e-Visits for anyone 79 and older to request care online for non-urgent symptoms. For details visit [email or web address].   Also download the MyChart app! Go to the app store, search "MyChart", open the app, select [facility], and log in with your MyChart username and password.  Due to Covid, a mask is required upon entering the hospital/clinic. If you do not have a mask, one will be given to you upon arrival. For doctor visits, patients may have 1 support person aged 49 or older with them. For treatment visits, patients cannot have anyone with them due to current Covid guidelines and our immunocompromised population.

## 2021-10-01 NOTE — Progress Notes (Signed)
[facility] [phone]   [phone]  OFFICE PROGRESS NOTE  [name], [place] [address] [address]  DIAGNOSIS: Stage IIIA (T1b, N2, M0) non-small cell lung cancer, adenocarcinoma presented with right upper lobe lung nodule as well as right upper paratracheal adenopathy and suspicious tiny nodule in the right middle lobe. The patient has PD-L1 expression was 90%  PRIOR THERAPY:  1) Neoadjuvant systemic chemotherapy with carboplatin for AUC of 5, Alimta 500 mg/M2 and Keytruda 200 mg IV every 3 weeks.  First dose 05/30/2020. Status post 3 cycles. 2) status post robotic assisted right thoracoscopy with right upper lobectomy and lymph node dissection under the care of Dr. Roxan Hockey on October 19, 2020.  There was residual tumor measuring 0.5 cm with lymph node metastasis involving 4R, 12 and hilar lymph nodes. 3) Adjuvant systemic chemotherapy with carboplatin for AUC of 5 and Alimta 500 Mg/M2 every 3 weeks.  First dose was given December 11, 2020.  Status post 4 cycles.  Last dose of chemotherapy was given Feb 13, 2021.   CURRENT THERAPY: Adjuvant immunotherapy with Atezolizumab 1200 Mg IV every 3 weeks.  First dose March 27, 2021.  Status post 9 cycles.  INTERVAL HISTORY: [name] 71 y.o. female returns to the clinic today for follow-up visit.  The patient is feeling fine today with no concerning complaints.  She denied having any current chest pain, shortness of breath, cough or hemoptysis.  She denied having any fever or chills.  She has no nausea, vomiting, diarrhea or constipation.  She has no headache or visual changes.  She denied having any significant weight loss or night sweats.  She is here today for evaluation before starting cycle #9 of her treatment.  MEDICAL HISTORY: Past Medical History:  Diagnosis Date   Cataract    COPD (chronic obstructive pulmonary disease) (HCC)     ALLERGIES:  has No Known  Allergies.  MEDICATIONS:  Current Outpatient Medications  Medication Sig Dispense Refill   Acetaminophen (TYLENOL ARTHRITIS EXT RELIEF PO) Take 1 capsule by mouth daily as needed.     albuterol (PROVENTIL HFA;VENTOLIN HFA) 108 (90 Base) MCG/ACT inhaler Inhale 2 puffs into the lungs every 4 (four) hours as needed for wheezing or shortness of breath (cough, shortness of breath or wheezing.). 1 Inhaler 1   alendronate (FOSAMAX) 70 MG tablet Take 70 mg by mouth once a week.     Calcium Carbonate (CALCIUM 600 PO) Take 600 mg by mouth 2 (two) times daily.      Cholecalciferol (VITAMIN D3 PO) Take 2,000 Units by mouth daily.     fluticasone (FLONASE) 50 MCG/ACT nasal spray Place 2 sprays into both nostrils daily. 16 g 12   Fluticasone-Salmeterol (ADVAIR) 250-50 MCG/DOSE AEPB Inhale 1 puff into the lungs 2 (two) times daily.      guaiFENesin (MUCINEX) 600 MG 12 hr tablet Take 1 tablet (600 mg total) by mouth 2 (two) times daily as needed for cough or to loosen phlegm. (Patient not taking: Reported on 07/30/2021)     HYDROcodone-acetaminophen (NORCO/VICODIN) 5-325 MG tablet Take 1-2 tablets by mouth every 6 (six) hours as needed.     levothyroxine (SYNTHROID) 75 MCG tablet TAKE 1 TABLET(75 MCG) BY MOUTH DAILY BEFORE BREAKFAST 30 tablet 2   Multiple Vitamin (MULTIVITAMIN) tablet Take 1 tablet by mouth daily.     No current facility-administered medications for this visit.    SURGICAL HISTORY:  Past  Surgical History:  Procedure Laterality Date   APPENDECTOMY  2008   COLON SURGERY  2008   colostomy-infection post op appendectomy   COLOSTOMY REVERSAL  2009   INTERCOSTAL NERVE BLOCK Right 10/19/2020   Procedure: INTERCOSTAL NERVE BLOCK;  Surgeon: [name], MD;  Location: [facility];  Service: Thoracic;  Laterality: Right;   NODE DISSECTION Right 10/19/2020   Procedure: NODE DISSECTION;  Surgeon: [name], MD;  Location: [facility];  Service: Thoracic;  Laterality: Right;   VIDEO  BRONCHOSCOPY WITH ENDOBRONCHIAL NAVIGATION N/A 05/08/2020   Procedure: VIDEO BRONCHOSCOPY WITH ENDOBRONCHIAL NAVIGATION;  Surgeon: [name], MD;  Location: [facility];  Service: Thoracic;  Laterality: N/A;   VIDEO BRONCHOSCOPY WITH ENDOBRONCHIAL ULTRASOUND N/A 05/08/2020   Procedure: VIDEO BRONCHOSCOPY WITH ENDOBRONCHIAL ULTRASOUND;  Surgeon: [name], MD;  Location: [facility];  Service: Thoracic;  Laterality: N/A;    REVIEW OF SYSTEMS:  A comprehensive review of systems was negative.   PHYSICAL EXAMINATION: General appearance: alert, cooperative, and no distress Head: Normocephalic, without obvious abnormality, atraumatic Neck: no adenopathy, no JVD, supple, symmetrical, trachea midline, and thyroid not enlarged, symmetric, no tenderness/mass/nodules Lymph nodes: Cervical, supraclavicular, and axillary nodes normal. Resp: clear to auscultation bilaterally Back: symmetric, no curvature. ROM normal. No CVA tenderness. Cardio: regular rate and rhythm, S1, S2 normal, no murmur, click, rub or gallop GI: soft, non-tender; bowel sounds normal; no masses,  no organomegaly Extremities: extremities normal, atraumatic, no cyanosis or edema  ECOG PERFORMANCE STATUS: 1 - Symptomatic but completely ambulatory  Blood pressure (!) 127/57, pulse 65, temperature 97.7 F (36.5 C), temperature source Tympanic, resp. rate 18, height _0  (1.6 m), weight 120 lb 14.4 oz (54.8 kg), SpO2 100 %.  LABORATORY DATA: Lab Results  Component Value Date   WBC 7.2 09/11/2021   HGB 11.9 (L) 09/11/2021   HCT 36.7 09/11/2021   MCV 95.3 09/11/2021   PLT 212 09/11/2021      Chemistry      Component Value Date/Time   NA 139 09/11/2021 0953   NA 135 10/29/2017 1131   K 3.9 09/11/2021 0953   CL 104 09/11/2021 0953   CO2 28 09/11/2021 0953   BUN 8 09/11/2021 0953   BUN 5 (L) 10/29/2017 1131   CREATININE 0.54 09/11/2021 0953      Component Value Date/Time   CALCIUM 9.4 09/11/2021 0953   ALKPHOS  47 09/11/2021 0953   AST 19 09/11/2021 0953   ALT 15 09/11/2021 0953   BILITOT 0.3 09/11/2021 0953       RADIOGRAPHIC STUDIES: No results found.  ASSESSMENT AND PLAN: This is a very pleasant 71 years old white female recently diagnosed with a stage IIIa (T1b, N2, M0) non-small cell lung cancer, adenocarcinoma presented with right upper lobe lung nodule in addition to mediastinal lymphadenopathy.  PD-L1 expression 90%. The patient had MRI of the brain performed recently that showed no evidence of intracranial metastatic disease. The patient underwent a course of neoadjuvant treatment with carboplatin for AUC of 5, Alimta 500 mg/M2 and Keytruda 200 mg IV every 3 weeks for 3 cycles.   She tolerated this treatment well with no concerning adverse effect except for dry skin and itching. Her repeat CT scan after the neoadjuvant treatment showed partial response with around 50% reduction in the tumor volume. The patient underwent right upper lobectomy with lymph node dissection on October 19, 2020 and the final pathology showed residual tumor measuring 0.5 cm with lymph node metastasis involving  hilar and mediastinal lymph nodes. She underwent adjuvant systemic chemotherapy with carboplatin for AUC of 5, Alimta 500 mg/M2 every 3 weeks since she has good response to this treatment in the past.  She is status post 4 cycles.  She tolerated this treatment well with no concerning adverse effect except for mild fatigue. She is currently undergoing adjuvant immunotherapy with Tecentriq 1200 Mg IV every 3 weeks status post 8 cycles.   The patient continues to tolerate her treatment with immunotherapy fairly well. I recommended for her to proceed with cycle #9 today as planned. I will see her back for follow-up visit in 3 weeks for evaluation before starting cycle #10. The patient was advised to call immediately if she has any other concerning symptoms in the interval. The patient voices understanding of  current disease status and treatment options and is in agreement with the current care plan.  All questions were answered. The patient knows to call the clinic with any problems, questions or concerns. We can certainly see the patient much sooner if necessary.   Disclaimer: This note was dictated with voice recognition software. Similar sounding words can inadvertently be transcribed and may not be corrected upon review.

## 2021-10-22 ENCOUNTER — Inpatient Hospital Stay: Payer: Medicare Other

## 2021-10-22 ENCOUNTER — Inpatient Hospital Stay: Payer: Medicare Other | Attending: Physician Assistant

## 2021-10-22 ENCOUNTER — Encounter: Payer: Self-pay | Admitting: *Deleted

## 2021-10-22 ENCOUNTER — Other Ambulatory Visit: Payer: Self-pay

## 2021-10-22 ENCOUNTER — Inpatient Hospital Stay: Payer: Medicare Other | Admitting: Internal Medicine

## 2021-10-22 VITALS — BP 106/56 | HR 82 | Temp 97.7°F | Resp 17 | Ht 63.0 in | Wt 122.7 lb

## 2021-10-22 DIAGNOSIS — C3411 Malignant neoplasm of upper lobe, right bronchus or lung: Secondary | ICD-10-CM

## 2021-10-22 DIAGNOSIS — Z5112 Encounter for antineoplastic immunotherapy: Secondary | ICD-10-CM | POA: Insufficient documentation

## 2021-10-22 DIAGNOSIS — Z79899 Other long term (current) drug therapy: Secondary | ICD-10-CM | POA: Insufficient documentation

## 2021-10-22 DIAGNOSIS — C771 Secondary and unspecified malignant neoplasm of intrathoracic lymph nodes: Secondary | ICD-10-CM | POA: Diagnosis not present

## 2021-10-22 LAB — CBC WITH DIFFERENTIAL (CANCER CENTER ONLY)
Abs Immature Granulocytes: 0.04 10*3/uL (ref 0.00–0.07)
Basophils Absolute: 0.1 10*3/uL (ref 0.0–0.1)
Basophils Relative: 1 %
Eosinophils Absolute: 0.1 10*3/uL (ref 0.0–0.5)
Eosinophils Relative: 1 %
HCT: 36.7 % (ref 36.0–46.0)
Hemoglobin: 12.4 g/dL (ref 12.0–15.0)
Immature Granulocytes: 1 %
Lymphocytes Relative: 22 %
Lymphs Abs: 1.7 10*3/uL (ref 0.7–4.0)
MCH: 31.5 pg (ref 26.0–34.0)
MCHC: 33.8 g/dL (ref 30.0–36.0)
MCV: 93.1 fL (ref 80.0–100.0)
Monocytes Absolute: 0.5 10*3/uL (ref 0.1–1.0)
Monocytes Relative: 7 %
Neutro Abs: 5.5 10*3/uL (ref 1.7–7.7)
Neutrophils Relative %: 68 %
Platelet Count: 235 10*3/uL (ref 150–400)
RBC: 3.94 MIL/uL (ref 3.87–5.11)
RDW: 12.7 % (ref 11.5–15.5)
WBC Count: 7.8 10*3/uL (ref 4.0–10.5)
nRBC: 0 % (ref 0.0–0.2)

## 2021-10-22 LAB — CMP (CANCER CENTER ONLY)
ALT: 12 U/L (ref 0–44)
AST: 17 U/L (ref 15–41)
Albumin: 4.4 g/dL (ref 3.5–5.0)
Alkaline Phosphatase: 44 U/L (ref 38–126)
Anion gap: 8 (ref 5–15)
BUN: 10 mg/dL (ref 8–23)
CO2: 28 mmol/L (ref 22–32)
Calcium: 9.6 mg/dL (ref 8.9–10.3)
Chloride: 104 mmol/L (ref 98–111)
Creatinine: 0.54 mg/dL (ref 0.44–1.00)
GFR, Estimated: >60 mL/min (ref >60)
Glucose, Bld: 78 mg/dL (ref 70–99)
Potassium: 3.6 mmol/L (ref 3.5–5.1)
Sodium: 140 mmol/L (ref 135–145)
Total Bilirubin: 0.4 mg/dL (ref 0.3–1.2)
Total Protein: 7 g/dL (ref 6.5–8.1)

## 2021-10-22 LAB — TSH: TSH: 3.215 u[IU]/mL (ref 0.308–3.960)

## 2021-10-22 MED ORDER — SODIUM CHLORIDE 0.9 % IV SOLN: Freq: Once | INTRAVENOUS | Status: AC

## 2021-10-22 MED ORDER — SODIUM CHLORIDE 0.9 % IV SOLN
1200.0000 mg | Freq: Once | INTRAVENOUS | Status: AC
  Administered 2021-10-22: 1200 mg via INTRAVENOUS
  Filled 2021-10-22: qty 20

## 2021-10-22 NOTE — Progress Notes (Signed)
I spoke with [name] today during her visit to see Dr. Julien Nordmann. She is doing well.  I asked her about how tx was going and listened as she explained. She is doing well.

## 2021-10-22 NOTE — Progress Notes (Signed)
[facility] [phone]   [phone]  OFFICE PROGRESS NOTE  [name], [place] [address]  [address]  DIAGNOSIS: Stage IIIA (T1b, N2, M0) non-small cell lung cancer, adenocarcinoma presented with right upper lobe lung nodule as well as right upper paratracheal adenopathy and suspicious tiny nodule in the right middle lobe. The patient has PD-L1 expression was 90%  PRIOR THERAPY:  1) Neoadjuvant systemic chemotherapy with carboplatin for AUC of 5, Alimta 500 mg/M2 and Keytruda 200 mg IV every 3 weeks.  First dose 05/30/2020. Status post 3 cycles. 2) status post robotic assisted right thoracoscopy with right upper lobectomy and lymph node dissection under the care of Dr. Roxan Hockey on October 19, 2020.  There was residual tumor measuring 0.5 cm with lymph node metastasis involving 4R, 12 and hilar lymph nodes. 3) Adjuvant systemic chemotherapy with carboplatin for AUC of 5 and Alimta 500 Mg/M2 every 3 weeks.  First dose was given December 11, 2020.  Status post 4 cycles.  Last dose of chemotherapy was given Feb 13, 2021.   CURRENT THERAPY: Adjuvant immunotherapy with Atezolizumab 1200 Mg IV every 3 weeks.  First dose March 27, 2021.  Status post 9 cycles.  INTERVAL HISTORY: [name] 71 y.o. female returns to the clinic today for follow-up visit.  The patient is feeling fine today with no concerning complaints except for the numbness on the right side of the chest from the surgical scar.  She denied having any shortness of breath, cough or hemoptysis.  She denied having any nausea, vomiting, diarrhea or constipation.  She has no headache or visual changes.  She continues to tolerate her treatment with atezolizumab fairly well.  She is here today for evaluation before starting cycle #10.  MEDICAL HISTORY: Past Medical History:  Diagnosis Date   Cataract    COPD (chronic obstructive pulmonary disease) (HCC)      ALLERGIES:  has No Known Allergies.  MEDICATIONS:  Current Outpatient Medications  Medication Sig Dispense Refill   Acetaminophen (TYLENOL ARTHRITIS EXT RELIEF PO) Take 1 capsule by mouth daily as needed.     albuterol (PROVENTIL HFA;VENTOLIN HFA) 108 (90 Base) MCG/ACT inhaler Inhale 2 puffs into the lungs every 4 (four) hours as needed for wheezing or shortness of breath (cough, shortness of breath or wheezing.). 1 Inhaler 1   alendronate (FOSAMAX) 70 MG tablet Take 70 mg by mouth once a week.     Calcium Carbonate (CALCIUM 600 PO) Take 600 mg by mouth 2 (two) times daily.      Cholecalciferol (VITAMIN D3 PO) Take 2,000 Units by mouth daily.     fluticasone (FLONASE) 50 MCG/ACT nasal spray Place 2 sprays into both nostrils daily. 16 g 12   Fluticasone-Salmeterol (ADVAIR) 250-50 MCG/DOSE AEPB Inhale 1 puff into the lungs 2 (two) times daily.      guaiFENesin (MUCINEX) 600 MG 12 hr tablet Take 1 tablet (600 mg total) by mouth 2 (two) times daily as needed for cough or to loosen phlegm. (Patient not taking: Reported on 07/30/2021)     HYDROcodone-acetaminophen (NORCO/VICODIN) 5-325 MG tablet Take 1-2 tablets by mouth every 6 (six) hours as needed.     levothyroxine (SYNTHROID) 75 MCG tablet TAKE 1 TABLET(75 MCG) BY MOUTH DAILY BEFORE BREAKFAST 30 tablet 2   Multiple Vitamin (MULTIVITAMIN) tablet Take 1 tablet by mouth daily.     No current facility-administered medications for this visit.    SURGICAL HISTORY:  Past Surgical History:  Procedure Laterality Date   APPENDECTOMY  2008   COLON SURGERY  2008   colostomy-infection post op appendectomy   COLOSTOMY REVERSAL  2009   INTERCOSTAL NERVE BLOCK Right 10/19/2020   Procedure: INTERCOSTAL NERVE BLOCK;  Surgeon: [name], MD;  Location: [facility];  Service: Thoracic;  Laterality: Right;   NODE DISSECTION Right 10/19/2020   Procedure: NODE DISSECTION;  Surgeon: [name], MD;  Location: [facility];  Service: Thoracic;   Laterality: Right;   VIDEO BRONCHOSCOPY WITH ENDOBRONCHIAL NAVIGATION N/A 05/08/2020   Procedure: VIDEO BRONCHOSCOPY WITH ENDOBRONCHIAL NAVIGATION;  Surgeon: [name], MD;  Location: [facility];  Service: Thoracic;  Laterality: N/A;   VIDEO BRONCHOSCOPY WITH ENDOBRONCHIAL ULTRASOUND N/A 05/08/2020   Procedure: VIDEO BRONCHOSCOPY WITH ENDOBRONCHIAL ULTRASOUND;  Surgeon: [name], MD;  [facility];  Service: Thoracic;  Laterality: N/A;    REVIEW OF SYSTEMS:  A comprehensive review of systems was negative.   PHYSICAL EXAMINATION: General appearance: alert, cooperative, and no distress Head: Normocephalic, without obvious abnormality, atraumatic Neck: no adenopathy, no JVD, supple, symmetrical, trachea midline, and thyroid not enlarged, symmetric, no tenderness/mass/nodules Lymph nodes: Cervical, supraclavicular, and axillary nodes normal. Resp: clear to auscultation bilaterally Back: symmetric, no curvature. ROM normal. No CVA tenderness. Cardio: regular rate and rhythm, S1, S2 normal, no murmur, click, rub or gallop GI: soft, non-tender; bowel sounds normal; no masses,  no organomegaly Extremities: extremities normal, atraumatic, no cyanosis or edema  ECOG PERFORMANCE STATUS: 1 - Symptomatic but completely ambulatory  Blood pressure (!) 106/56, pulse 82, temperature 97.7 F (36.5 C), temperature source Tympanic, resp. rate 17, height _0  (1.6 m), weight 122 lb 11.2 oz (55.7 kg), SpO2 96 %.  LABORATORY DATA: Lab Results  Component Value Date   WBC 7.8 10/22/2021   HGB 12.4 10/22/2021   HCT 36.7 10/22/2021   MCV 93.1 10/22/2021   PLT 235 10/22/2021      Chemistry      Component Value Date/Time   NA 139 10/01/2021 0945   NA 135 10/29/2017 1131   K 3.5 10/01/2021 0945   CL 104 10/01/2021 0945   CO2 26 10/01/2021 0945   BUN 9 10/01/2021 0945   BUN 5 (L) 10/29/2017 1131   CREATININE 0.55 10/01/2021 0945      Component Value Date/Time   CALCIUM 9.1  10/01/2021 0945   ALKPHOS 44 10/01/2021 0945   AST 19 10/01/2021 0945   ALT 12 10/01/2021 0945   BILITOT 0.4 10/01/2021 0945       RADIOGRAPHIC STUDIES: No results found.  ASSESSMENT AND PLAN: This is a very pleasant 71 years old white female recently diagnosed with a stage IIIa (T1b, N2, M0) non-small cell lung cancer, adenocarcinoma presented with right upper lobe lung nodule in addition to mediastinal lymphadenopathy.  PD-L1 expression 90%. The patient had MRI of the brain performed recently that showed no evidence of intracranial metastatic disease. The patient underwent a course of neoadjuvant treatment with carboplatin for AUC of 5, Alimta 500 mg/M2 and Keytruda 200 mg IV every 3 weeks for 3 cycles.   She tolerated this treatment well with no concerning adverse effect except for dry skin and itching. Her repeat CT scan after the neoadjuvant treatment showed partial response with around 50% reduction in the tumor volume. The patient underwent right upper lobectomy with lymph node dissection on October 19, 2020 and the final pathology showed residual tumor measuring 0.5 cm with lymph node metastasis involving  hilar and mediastinal lymph nodes. She underwent adjuvant systemic chemotherapy with carboplatin for AUC of 5, Alimta 500 mg/M2 every 3 weeks since she has good response to this treatment in the past.  She is status post 4 cycles.  She tolerated this treatment well with no concerning adverse effect except for mild fatigue. She is currently undergoing adjuvant immunotherapy with Tecentriq 1200 Mg IV every 3 weeks status post 9 cycles.   The patient has been tolerating this treatment well with no concerning adverse effects. I recommended for her to proceed with cycle #10 today as planned. I will see her back for follow-up visit in 3 weeks for evaluation before the next cycle of her treatment. She was advised to call immediately if she has any other concerning symptoms in the  interval. The patient voices understanding of current disease status and treatment options and is in agreement with the current care plan.  All questions were answered. The patient knows to call the clinic with any problems, questions or concerns. We can certainly see the patient much sooner if necessary.   Disclaimer: This note was dictated with voice recognition software. Similar sounding words can inadvertently be transcribed and may not be corrected upon review.

## 2021-10-22 NOTE — Patient Instructions (Signed)
[facility]  Discharge Instructions: Thank you for choosing [facility] to provide your oncology and hematology care.   If you have a lab appointment with the [facility], please go directly to the [facility] and check in at the registration area.   Wear comfortable clothing and clothing appropriate for easy access to any Portacath or PICC line.   We strive to give you quality time with your provider. You may need to reschedule your appointment if you arrive late (15 or more minutes).  Arriving late affects you and other patients whose appointments are after yours.  Also, if you miss three or more appointments without notifying the office, you may be dismissed from the clinic at the providers discretion.      For prescription refill requests, have your pharmacy contact our office and allow 72 hours for refills to be completed.    Today you received the following chemotherapy and/or immunotherapy agent: Atezolizumab (Tecentriq).    To help prevent nausea and vomiting after your treatment, we encourage you to take your nausea medication as directed.  BELOW ARE SYMPTOMS THAT SHOULD BE REPORTED IMMEDIATELY: *FEVER GREATER THAN 100.4 F (38 C) OR HIGHER *CHILLS OR SWEATING *NAUSEA AND VOMITING THAT IS NOT CONTROLLED WITH YOUR NAUSEA MEDICATION *UNUSUAL SHORTNESS OF BREATH *UNUSUAL BRUISING OR BLEEDING *URINARY PROBLEMS (pain or burning when urinating, or frequent urination) *BOWEL PROBLEMS (unusual diarrhea, constipation, pain near the anus) TENDERNESS IN MOUTH AND THROAT WITH OR WITHOUT PRESENCE OF ULCERS (sore throat, sores in mouth, or a toothache) UNUSUAL RASH, SWELLING OR PAIN  UNUSUAL VAGINAL DISCHARGE OR ITCHING   Items with * indicate a potential emergency and should be followed up as soon as possible or go to the Emergency Department if any problems should occur.  Please show the CHEMOTHERAPY ALERT CARD or IMMUNOTHERAPY ALERT CARD at  check-in to the Emergency Department and triage nurse.  Should you have questions after your visit or need to cancel or reschedule your appointment, please contact [facility]  Dept: [phone]  and follow the prompts.  Office hours are 8:00 a.m. to 4:30 p.m. Monday - Friday. Please note that voicemails left after 4:00 p.m. may not be returned until the following business day.  We are closed weekends and major holidays. You have access to a nurse at all times for urgent questions. Please call the main number to the clinic Dept: [phone] and follow the prompts.   For any non-urgent questions, you may also contact your provider using MyChart. We now offer e-Visits for anyone 72 and older to request care online for non-urgent symptoms. For details visit [email or web address].   Also download the MyChart app! Go to the app store, search "MyChart", open the app, select [facility], and log in with your MyChart username and password.  Due to Covid, a mask is required upon entering the hospital/clinic. If you do not have a mask, one will be given to you upon arrival. For doctor visits, patients may have 1 support person aged 72 or older with them. For treatment visits, patients cannot have anyone with them due to current Covid guidelines and our immunocompromised population.

## 2021-10-26 ENCOUNTER — Telehealth: Payer: Self-pay | Admitting: Physician Assistant

## 2021-10-26 NOTE — Telephone Encounter (Signed)
Scheduled per 02/06 los, patient has been called and notified of upcoming appointments.

## 2021-11-08 NOTE — Progress Notes (Unsigned)
[facility] OFFICE PROGRESS NOTE  [name], [address]   [id number]  DIAGNOSIS: Stage IIIA (T1b, N2, M0) non-small cell lung cancer, adenocarcinoma presented with right upper lobe lung nodule as well as right upper paratracheal adenopathy and suspicious tiny nodule in the right middle lobe. The patient has PD-L1 expression was 90%  PRIOR THERAPY: 1) Neoadjuvant systemic chemotherapy with carboplatin for AUC of 5, Alimta 500 mg/M2 and Keytruda 200 mg IV every 3 weeks.  First dose 05/30/2020. Status post 3 cycles. 2) status post robotic assisted right thoracoscopy with right upper lobectomy and lymph node dissection under the care of Dr. Roxan Hockey on October 19, 2020.  There was residual tumor measuring 0.5 cm with lymph node metastasis involving 4R, 12 and hilar lymph nodes. 3) Adjuvant systemic chemotherapy with carboplatin for AUC of 5 and Alimta 500 Mg/M2 every 3 weeks.  First dose was given December 11, 2020.  Status post 4 cycles.  Last dose of chemotherapy was given Feb 13, 2021.    CURRENT THERAPY: Adjuvant immunotherapy with Atezolizumab 1200 Mg IV every 3 weeks.  First dose March 27, 2021.  Status post 10 cycles.  INTERVAL HISTORY: [name] 71 y.o. female returns to the clinic for a follow up visit. The patient is feeling well today without any concerning complaints. The patient continues to tolerate treatment with adjuvant immunotherapy well without any adverse effects. Denies any fever, chills, night sweats, or weight loss. Denies any chest pain, shortness of breath, cough, or hemoptysis. Denies any nausea, vomiting, diarrhea, or unusual constipation. Denies any headache or visual changes. Denies any rashes or skin changes. We are monitoring her TSH closely. She notes for her back pain she has stopped taking gabapentin and now only takes 1 tylenol arthritis before bedtime to help her sleep due to the back pain. The patient is here  today for evaluation prior to starting cycle # 11     MEDICAL HISTORY: Past Medical History:  Diagnosis Date   Cataract    COPD (chronic obstructive pulmonary disease) ([place])     ALLERGIES:  has No Known Allergies.  MEDICATIONS:  Current Outpatient Medications  Medication Sig Dispense Refill   Acetaminophen (TYLENOL ARTHRITIS EXT RELIEF PO) Take 1 capsule by mouth daily as needed.     albuterol (PROVENTIL HFA;VENTOLIN HFA) 108 (90 Base) MCG/ACT inhaler Inhale 2 puffs into the lungs every 4 (four) hours as needed for wheezing or shortness of breath (cough, shortness of breath or wheezing.). 1 Inhaler 1   alendronate (FOSAMAX) 70 MG tablet Take 70 mg by mouth once a week.     Calcium Carbonate (CALCIUM 600 PO) Take 600 mg by mouth 2 (two) times daily.      Cholecalciferol (VITAMIN D3 PO) Take 2,000 Units by mouth daily.     fluticasone (FLONASE) 50 MCG/ACT nasal spray Place 2 sprays into both nostrils daily. 16 g 12   Fluticasone-Salmeterol (ADVAIR) 250-50 MCG/DOSE AEPB Inhale 1 puff into the lungs 2 (two) times daily.      guaiFENesin (MUCINEX) 600 MG 12 hr tablet Take 1 tablet (600 mg total) by mouth 2 (two) times daily as needed for cough or to loosen phlegm. (Patient not taking: Reported on 07/30/2021)     HYDROcodone-acetaminophen (NORCO/VICODIN) 5-325 MG tablet Take 1-2 tablets by mouth every 6 (six) hours as needed.     levothyroxine (SYNTHROID) 75 MCG tablet TAKE 1 TABLET(75 MCG) BY MOUTH DAILY BEFORE BREAKFAST 30 tablet 2  Multiple Vitamin (MULTIVITAMIN) tablet Take 1 tablet by mouth daily.     No current facility-administered medications for this visit.    SURGICAL HISTORY:  Past Surgical History:  Procedure Laterality Date   APPENDECTOMY  2008   COLON SURGERY  2008   colostomy-infection post op appendectomy   COLOSTOMY REVERSAL  2009   INTERCOSTAL NERVE BLOCK Right 10/19/2020   Procedure: INTERCOSTAL NERVE BLOCK;  Surgeon: [name], MD;  Location: [facility];   Service: Thoracic;  Laterality: Right;   NODE DISSECTION Right 10/19/2020   Procedure: NODE DISSECTION;  Surgeon: [name], MD;  Location: [facility];  Service: Thoracic;  Laterality: Right;   VIDEO BRONCHOSCOPY WITH ENDOBRONCHIAL NAVIGATION N/A 05/08/2020   Procedure: VIDEO BRONCHOSCOPY WITH ENDOBRONCHIAL NAVIGATION;  Surgeon: [name], MD;  Location: [facility];  Service: Thoracic;  Laterality: N/A;   VIDEO BRONCHOSCOPY WITH ENDOBRONCHIAL ULTRASOUND N/A 05/08/2020   Procedure: VIDEO BRONCHOSCOPY WITH ENDOBRONCHIAL ULTRASOUND;  Surgeon: [name], MD;  [facility];  Service: Thoracic;  Laterality: N/A;    REVIEW OF SYSTEMS:   Review of Systems  Constitutional: Negative for appetite change, chills, fatigue, fever and unexpected weight change.  HENT:   Negative for mouth sores, nosebleeds, sore throat and trouble swallowing.   Eyes: Negative for eye problems and icterus.  Respiratory: Negative for cough, hemoptysis, shortness of breath and wheezing.   Cardiovascular: Negative for chest pain and leg swelling.  Gastrointestinal: Negative for abdominal pain, constipation, diarrhea, nausea and vomiting.  Genitourinary: Negative for bladder incontinence, difficulty urinating, dysuria, frequency and hematuria.   Musculoskeletal: Negative for back pain, gait problem, neck pain and neck stiffness.  Skin: Negative for itching and rash.  Neurological: Negative for dizziness, extremity weakness, gait problem, headaches, light-headedness and seizures.  Hematological: Negative for adenopathy. Does not bruise/bleed easily.  Psychiatric/Behavioral: Negative for confusion, depression and sleep disturbance. The patient is not nervous/anxious.     PHYSICAL EXAMINATION:  There were no vitals taken for this visit.  ECOG PERFORMANCE STATUS: {CHL ONC ECOG [id number]  Physical Exam  Constitutional: Oriented to person, place, and time and well-developed, well-nourished, and in  no distress. No distress.  HENT:  Head: Normocephalic and atraumatic.  Mouth/Throat: Oropharynx is clear and moist. No oropharyngeal exudate.  Eyes: Conjunctivae are normal. Right eye exhibits no discharge. Left eye exhibits no discharge. No scleral icterus.  Neck: Normal range of motion. Neck supple.  Cardiovascular: Normal rate, regular rhythm, normal heart sounds and intact distal pulses.   Pulmonary/Chest: Effort normal and breath sounds normal. No respiratory distress. No wheezes. No rales.  Abdominal: Soft. Bowel sounds are normal. Exhibits no distension and no mass. There is no tenderness.  Musculoskeletal: Normal range of motion. Exhibits no edema.  Lymphadenopathy:    No cervical adenopathy.  Neurological: Alert and oriented to person, place, and time. Exhibits normal muscle tone. Gait normal. Coordination normal.  Skin: Skin is warm and dry. No rash noted. Not diaphoretic. No erythema. No pallor.  Psychiatric: Mood, memory and judgment normal.  Vitals reviewed.  LABORATORY DATA: Lab Results  Component Value Date   WBC 7.8 10/22/2021   HGB 12.4 10/22/2021   HCT 36.7 10/22/2021   MCV 93.1 10/22/2021   PLT 235 10/22/2021      Chemistry      Component Value Date/Time   NA 140 10/22/2021 0956   NA 135 10/29/2017 1131   K 3.6 10/22/2021 0956   CL 104 10/22/2021 0956   CO2 28 10/22/2021 0956  BUN 10 10/22/2021 0956   BUN 5 (L) 10/29/2017 1131   CREATININE 0.54 10/22/2021 0956      Component Value Date/Time   CALCIUM 9.6 10/22/2021 0956   ALKPHOS 44 10/22/2021 0956   AST 17 10/22/2021 0956   ALT 12 10/22/2021 0956   BILITOT 0.4 10/22/2021 0956       RADIOGRAPHIC STUDIES:  No results found.   ASSESSMENT/PLAN:  This is a very pleasant 71 year old Caucasian female diagnosed with a stage IIIa (T1b, N2, M0) non-small cell lung cancer, adenocarcinoma presented with right upper lobe lung nodule in addition to mediastinal lymphadenopathy.  PD-L1 expression 90%.    The patient underwent a course of neoadjuvant treatment with carboplatin for AUC of 5, Alimta 500 mg/M2 and Keytruda 200 mg IV every 3 weeks for 3 cycles.   She tolerated this treatment well with no concerning adverse effect except for dry skin and itching.   Her repeat CT scan after the neoadjuvant treatment showed partial response with around 50% reduction in the tumor volume. The patient underwent right upper lobectomy with lymph node dissection on October 19, 2020 and the final pathology showed residual tumor measuring 0.5 cm with lymph node metastasis involving hilar and mediastinal lymph nodes. She underwent adjuvant systemic chemotherapy with carboplatin for AUC of 5, Alimta 500 mg/M2 every 3 weeks since she has good response to this treatment in the past.  She is status post 4 cycles.  She tolerated this treatment well with no concerning adverse effect except for mild fatigue.  She is currently undergoing adjuvant immunotherapy with Tecentriq 1200 Mg IV every 3 weeks status post 10 cycles.     Labs were reviewed. Recommend that she proceed with cycle #11 today as scheduled.   We will see her back for a follow up visit in 3 weeks for evaluation before starting cycle #12.    We will monitor her TSH closely and make dose adjustments if necessary. Her labs are pending from today.   The patient was advised to call immediately if she has any concerning symptoms in the interval. The patient voices understanding of current disease status and treatment options and is in agreement with the current care plan. All questions were answered. The patient knows to call the clinic with any problems, questions or concerns. We can certainly see the patient much sooner if necessary        No orders of the defined types were placed in this encounter.    I spent {CHL ONC TIME VISIT - [id number]} counseling the patient face to face. The total time spent in the appointment was {CHL ONC TIME VISIT -  TDSKA:[id number]}.  Layann Bluett L Colbin Jovel, PA-C 11/08/21

## 2021-11-12 ENCOUNTER — Inpatient Hospital Stay: Payer: Medicare Other

## 2021-11-12 ENCOUNTER — Inpatient Hospital Stay: Payer: Medicare Other | Admitting: Physician Assistant

## 2021-11-12 ENCOUNTER — Telehealth: Payer: Self-pay | Admitting: Internal Medicine

## 2021-11-12 NOTE — Telephone Encounter (Signed)
Sch per 2/27 inbasket, left pt message

## 2021-11-14 ENCOUNTER — Inpatient Hospital Stay: Payer: Medicare Other

## 2021-11-14 ENCOUNTER — Other Ambulatory Visit: Payer: Self-pay

## 2021-11-14 ENCOUNTER — Inpatient Hospital Stay: Payer: Medicare Other | Admitting: Internal Medicine

## 2021-11-14 ENCOUNTER — Encounter: Payer: Self-pay | Admitting: Internal Medicine

## 2021-11-14 ENCOUNTER — Inpatient Hospital Stay: Payer: Medicare Other | Attending: Physician Assistant

## 2021-11-14 VITALS — BP 107/54 | HR 69 | Temp 97.7°F | Resp 18 | Ht 63.0 in | Wt 122.2 lb

## 2021-11-14 DIAGNOSIS — Z5112 Encounter for antineoplastic immunotherapy: Secondary | ICD-10-CM

## 2021-11-14 DIAGNOSIS — Z79899 Other long term (current) drug therapy: Secondary | ICD-10-CM | POA: Insufficient documentation

## 2021-11-14 DIAGNOSIS — C771 Secondary and unspecified malignant neoplasm of intrathoracic lymph nodes: Secondary | ICD-10-CM | POA: Diagnosis not present

## 2021-11-14 DIAGNOSIS — C3411 Malignant neoplasm of upper lobe, right bronchus or lung: Secondary | ICD-10-CM | POA: Insufficient documentation

## 2021-11-14 LAB — CBC WITH DIFFERENTIAL (CANCER CENTER ONLY)
Abs Immature Granulocytes: 0.03 10*3/uL (ref 0.00–0.07)
Basophils Absolute: 0 10*3/uL (ref 0.0–0.1)
Basophils Relative: 0 %
Eosinophils Absolute: 0 10*3/uL (ref 0.0–0.5)
Eosinophils Relative: 0 %
HCT: 36.2 % (ref 36.0–46.0)
Hemoglobin: 12.1 g/dL (ref 12.0–15.0)
Immature Granulocytes: 0 %
Lymphocytes Relative: 17 %
Lymphs Abs: 1.5 10*3/uL (ref 0.7–4.0)
MCH: 31.4 pg (ref 26.0–34.0)
MCHC: 33.4 g/dL (ref 30.0–36.0)
MCV: 94 fL (ref 80.0–100.0)
Monocytes Absolute: 0.5 10*3/uL (ref 0.1–1.0)
Monocytes Relative: 6 %
Neutro Abs: 6.9 10*3/uL (ref 1.7–7.7)
Neutrophils Relative %: 77 %
Platelet Count: 220 10*3/uL (ref 150–400)
RBC: 3.85 MIL/uL — ABNORMAL LOW (ref 3.87–5.11)
RDW: 12.4 % (ref 11.5–15.5)
WBC Count: 9 10*3/uL (ref 4.0–10.5)
nRBC: 0 % (ref 0.0–0.2)

## 2021-11-14 LAB — CMP (CANCER CENTER ONLY)
ALT: 13 U/L (ref 0–44)
AST: 16 U/L (ref 15–41)
Albumin: 4.4 g/dL (ref 3.5–5.0)
Alkaline Phosphatase: 43 U/L (ref 38–126)
Anion gap: 8 (ref 5–15)
BUN: 11 mg/dL (ref 8–23)
CO2: 27 mmol/L (ref 22–32)
Calcium: 9.4 mg/dL (ref 8.9–10.3)
Chloride: 107 mmol/L (ref 98–111)
Creatinine: 0.54 mg/dL (ref 0.44–1.00)
GFR, Estimated: >60 mL/min (ref >60)
Glucose, Bld: 80 mg/dL (ref 70–99)
Potassium: 3.6 mmol/L (ref 3.5–5.1)
Sodium: 142 mmol/L (ref 135–145)
Total Bilirubin: 0.5 mg/dL (ref 0.3–1.2)
Total Protein: 7 g/dL (ref 6.5–8.1)

## 2021-11-14 LAB — TSH: TSH: 1.294 u[IU]/mL (ref 0.308–3.960)

## 2021-11-14 MED ORDER — SODIUM CHLORIDE 0.9 % IV SOLN: Freq: Once | INTRAVENOUS | Status: AC

## 2021-11-14 MED ORDER — SODIUM CHLORIDE 0.9 % IV SOLN
1200.0000 mg | Freq: Once | INTRAVENOUS | Status: AC
  Administered 2021-11-14: 1200 mg via INTRAVENOUS
  Filled 2021-11-14: qty 20

## 2021-11-14 NOTE — Progress Notes (Signed)
[facility] [phone]   [phone]  OFFICE PROGRESS NOTE  [name], [address] [address]  DIAGNOSIS: Stage IIIA (T1b, N2, M0) non-small cell lung cancer, adenocarcinoma presented with right upper lobe lung nodule as well as right upper paratracheal adenopathy and suspicious tiny nodule in the right middle lobe. The patient has PD-L1 expression was 90%  PRIOR THERAPY:  1) Neoadjuvant systemic chemotherapy with carboplatin for AUC of 5, Alimta 500 mg/M2 and Keytruda 200 mg IV every 3 weeks.  First dose 05/30/2020. Status post 3 cycles. 2) status post robotic assisted right thoracoscopy with right upper lobectomy and lymph node dissection under the care of Dr. Roxan Hockey on October 19, 2020.  There was residual tumor measuring 0.5 cm with lymph node metastasis involving 4R, 12 and hilar lymph nodes. 3) Adjuvant systemic chemotherapy with carboplatin for AUC of 5 and Alimta 500 Mg/M2 every 3 weeks.  First dose was given December 11, 2020.  Status post 4 cycles.  Last dose of chemotherapy was given Feb 13, 2021.   CURRENT THERAPY: Adjuvant immunotherapy with Atezolizumab 1200 Mg IV every 3 weeks.  First dose March 27, 2021.  Status post 10 cycles.  INTERVAL HISTORY: [name] Candler 71 y.o. female returns to the clinic today for follow-up visit.  The patient is feeling fine today with no concerning complaints.  She is having a lot of stress in her life with her son-in-law who was recently diagnosed with cancer and the friend who had stroke during sleep and currently undergoing surgery.  She presently feeling well with no concerning physical complaints.  She denied having any chest pain, shortness of breath, cough or hemoptysis.  She has no nausea, vomiting, diarrhea or constipation.  She has no headache or visual changes.  She continues to tolerate her treatment with atezolizumab fairly well.  She is here for evaluation  before starting cycle #11.   MEDICAL HISTORY: Past Medical History:  Diagnosis Date   Cataract    COPD (chronic obstructive pulmonary disease) ([facility])     ALLERGIES:  has No Known Allergies.  MEDICATIONS:  Current Outpatient Medications  Medication Sig Dispense Refill   Acetaminophen (TYLENOL ARTHRITIS EXT RELIEF PO) Take 1 capsule by mouth daily as needed.     albuterol (PROVENTIL HFA;VENTOLIN HFA) 108 (90 Base) MCG/ACT inhaler Inhale 2 puffs into the lungs every 4 (four) hours as needed for wheezing or shortness of breath (cough, shortness of breath or wheezing.). 1 Inhaler 1   alendronate (FOSAMAX) 70 MG tablet Take 70 mg by mouth once a week.     Calcium Carbonate (CALCIUM 600 PO) Take 600 mg by mouth 2 (two) times daily.      Cholecalciferol (VITAMIN D3 PO) Take 2,000 Units by mouth daily.     fluticasone (FLONASE) 50 MCG/ACT nasal spray Place 2 sprays into both nostrils daily. 16 g 12   Fluticasone-Salmeterol (ADVAIR) 250-50 MCG/DOSE AEPB Inhale 1 puff into the lungs 2 (two) times daily.      HYDROcodone-acetaminophen (NORCO/VICODIN) 5-325 MG tablet Take 1-2 tablets by mouth every 6 (six) hours as needed.     levothyroxine (SYNTHROID) 75 MCG tablet TAKE 1 TABLET(75 MCG) BY MOUTH DAILY BEFORE BREAKFAST 30 tablet 2   Multiple Vitamin (MULTIVITAMIN) tablet Take 1 tablet by mouth daily.     guaiFENesin (MUCINEX) 600 MG 12 hr tablet Take 1 tablet (600 mg total) by mouth 2 (two) times daily as needed  for cough or to loosen phlegm. (Patient not taking: Reported on 07/30/2021)     No current facility-administered medications for this visit.    SURGICAL HISTORY:  Past Surgical History:  Procedure Laterality Date   APPENDECTOMY  2008   COLON SURGERY  2008   colostomy-infection post op appendectomy   COLOSTOMY REVERSAL  2009   INTERCOSTAL NERVE BLOCK Right 10/19/2020   Procedure: INTERCOSTAL NERVE BLOCK;  Surgeon: [name], MD;  Location: [facility];  Service: Thoracic;   Laterality: Right;   NODE DISSECTION Right 10/19/2020   Procedure: NODE DISSECTION;  Surgeon: [name], MD;  Location: [facility];  Service: Thoracic;  Laterality: Right;   VIDEO BRONCHOSCOPY WITH ENDOBRONCHIAL NAVIGATION N/A 05/08/2020   Procedure: VIDEO BRONCHOSCOPY WITH ENDOBRONCHIAL NAVIGATION;  Surgeon: [name], MD;  Location: [facility];  Service: Thoracic;  Laterality: N/A;   VIDEO BRONCHOSCOPY WITH ENDOBRONCHIAL ULTRASOUND N/A 05/08/2020   Procedure: VIDEO BRONCHOSCOPY WITH ENDOBRONCHIAL ULTRASOUND;  Surgeon: [name], MD;  [facility];  Service: Thoracic;  Laterality: N/A;    REVIEW OF SYSTEMS:  A comprehensive review of systems was negative.   PHYSICAL EXAMINATION: General appearance: alert, cooperative, and no distress Head: Normocephalic, without obvious abnormality, atraumatic Neck: no adenopathy, no JVD, supple, symmetrical, trachea midline, and thyroid not enlarged, symmetric, no tenderness/mass/nodules Lymph nodes: Cervical, supraclavicular, and axillary nodes normal. Resp: clear to auscultation bilaterally Back: symmetric, no curvature. ROM normal. No CVA tenderness. Cardio: regular rate and rhythm, S1, S2 normal, no murmur, click, rub or gallop GI: soft, non-tender; bowel sounds normal; no masses,  no organomegaly Extremities: extremities normal, atraumatic, no cyanosis or edema  ECOG PERFORMANCE STATUS: 1 - Symptomatic but completely ambulatory  Blood pressure (!) 107/54, pulse 69, temperature 97.7 F (36.5 C), temperature source Tympanic, resp. rate 18, height 5' 3"  (1.6 m), weight 122 lb 3.2 oz (55.4 kg), SpO2 98 %.  LABORATORY DATA: Lab Results  Component Value Date   WBC 9.0 11/14/2021   HGB 12.1 11/14/2021   HCT 36.2 11/14/2021   MCV 94.0 11/14/2021   PLT 220 11/14/2021      Chemistry      Component Value Date/Time   NA 140 10/22/2021 0956   NA 135 10/29/2017 1131   K 3.6 10/22/2021 0956   CL 104 10/22/2021 0956   CO2 28  10/22/2021 0956   BUN 10 10/22/2021 0956   BUN 5 (L) 10/29/2017 1131   CREATININE 0.54 10/22/2021 0956      Component Value Date/Time   CALCIUM 9.6 10/22/2021 0956   ALKPHOS 44 10/22/2021 0956   AST 17 10/22/2021 0956   ALT 12 10/22/2021 0956   BILITOT 0.4 10/22/2021 0956       RADIOGRAPHIC STUDIES: No results found.  ASSESSMENT AND PLAN: This is a very pleasant 71 years old white female recently diagnosed with a stage IIIa (T1b, N2, M0) non-small cell lung cancer, adenocarcinoma presented with right upper lobe lung nodule in addition to mediastinal lymphadenopathy.  PD-L1 expression 90%. The patient had MRI of the brain performed recently that showed no evidence of intracranial metastatic disease. The patient underwent a course of neoadjuvant treatment with carboplatin for AUC of 5, Alimta 500 mg/M2 and Keytruda 200 mg IV every 3 weeks for 3 cycles.   She tolerated this treatment well with no concerning adverse effect except for dry skin and itching. Her repeat CT scan after the neoadjuvant treatment showed partial response with around 50% reduction in the tumor volume.  The patient underwent right upper lobectomy with lymph node dissection on October 19, 2020 and the final pathology showed residual tumor measuring 0.5 cm with lymph node metastasis involving hilar and mediastinal lymph nodes. She underwent adjuvant systemic chemotherapy with carboplatin for AUC of 5, Alimta 500 mg/M2 every 3 weeks since she has good response to this treatment in the past.  She is status post 4 cycles.  She tolerated this treatment well with no concerning adverse effect except for mild fatigue. She is currently undergoing adjuvant immunotherapy with Tecentriq 1200 Mg IV every 3 weeks status post 10 cycles.   The patient continues to tolerate this treatment well with no concerning adverse effects. I recommended for her to proceed with cycle #11 today as planned. I will see the patient back for follow-up  visit in 3 weeks for evaluation before starting cycle #12. She was advised to call immediately if she has any other concerning symptoms in the interval. The patient voices understanding of current disease status and treatment options and is in agreement with the current care plan.  All questions were answered. The patient knows to call the clinic with any problems, questions or concerns. We can certainly see the patient much sooner if necessary.   Disclaimer: This note was dictated with voice recognition software. Similar sounding words can inadvertently be transcribed and may not be corrected upon review.

## 2021-11-14 NOTE — Patient Instructions (Signed)
[facility]  Discharge Instructions: ?Thank you for choosing [facility] to provide your oncology and hematology care.  ? ?If you have a lab appointment with the [facility], please go directly to the [facility] and check in at the registration area. ?  ?Wear comfortable clothing and clothing appropriate for easy access to any Portacath or PICC line.  ? ?We strive to give you quality time with your provider. You may need to reschedule your appointment if you arrive late (15 or more minutes).  Arriving late affects you and other patients whose appointments are after yours.  Also, if you miss three or more appointments without notifying the office, you may be dismissed from the clinic at the provider?s discretion.    ?  ?For prescription refill requests, have your pharmacy contact our office and allow 72 hours for refills to be completed.   ? ?Today you received the following chemotherapy and/or immunotherapy agent: Atezolizumab (Tecentriq).  ?  ?To help prevent nausea and vomiting after your treatment, we encourage you to take your nausea medication as directed. ? ?BELOW ARE SYMPTOMS THAT SHOULD BE REPORTED IMMEDIATELY: ?*FEVER GREATER THAN 100.4 F (38 ?C) OR HIGHER ?*CHILLS OR SWEATING ?*NAUSEA AND VOMITING THAT IS NOT CONTROLLED WITH YOUR NAUSEA MEDICATION ?*UNUSUAL SHORTNESS OF BREATH ?*UNUSUAL BRUISING OR BLEEDING ?*URINARY PROBLEMS (pain or burning when urinating, or frequent urination) ?*BOWEL PROBLEMS (unusual diarrhea, constipation, pain near the anus) ?TENDERNESS IN MOUTH AND THROAT WITH OR WITHOUT PRESENCE OF ULCERS (sore throat, sores in mouth, or a toothache) ?UNUSUAL RASH, SWELLING OR PAIN  ?UNUSUAL VAGINAL DISCHARGE OR ITCHING  ? ?Items with * indicate a potential emergency and should be followed up as soon as possible or go to the Emergency Department if any problems should occur. ? ?Please show the CHEMOTHERAPY ALERT CARD or IMMUNOTHERAPY ALERT CARD at  check-in to the Emergency Department and triage nurse. ? ?Should you have questions after your visit or need to cancel or reschedule your appointment, please contact [facility]  Dept: [phone]  and follow the prompts.  Office hours are 8:00 a.m. to 4:30 p.m. Monday - Friday. Please note that voicemails left after 4:00 p.m. may not be returned until the following business day.  We are closed weekends and major holidays. You have access to a nurse at all times for urgent questions. Please call the main number to the clinic Dept: [phone] and follow the prompts. ? ? ?For any non-urgent questions, you may also contact your provider using MyChart. We now offer e-Visits for anyone 72 and older to request care online for non-urgent symptoms. For details visit [email or web address]. ?  ?Also download the MyChart app! Go to the app store, search "MyChart", open the app, select [facility], and log in with your MyChart username and password. ? ?Due to Covid, a mask is required upon entering the hospital/clinic. If you do not have a mask, one will be given to you upon arrival. For doctor visits, patients may have 1 support person aged 66 or older with them. For treatment visits, patients cannot have anyone with them due to current Covid guidelines and our immunocompromised population.  ? ?

## 2021-11-19 ENCOUNTER — Telehealth: Payer: Self-pay | Admitting: Internal Medicine

## 2021-11-19 NOTE — Telephone Encounter (Signed)
Scheduled per 03/01 los, patient has been called and notified. ?

## 2021-11-26 ENCOUNTER — Ambulatory Visit: Payer: Medicare Other | Admitting: Internal Medicine

## 2021-11-26 ENCOUNTER — Ambulatory Visit: Payer: Medicare Other

## 2021-11-26 ENCOUNTER — Other Ambulatory Visit: Payer: Medicare Other

## 2021-11-30 ENCOUNTER — Telehealth: Payer: Self-pay

## 2021-11-30 NOTE — Telephone Encounter (Signed)
Pts husband called stating pt might have been exposed to [place] by her sister when she visited. Pt denies sx at this time and has a home COVID test. Pt has appt Monday 12/03/20 and wants to know if she should still come. Pt was advised to get tested and if she is positive or starts to have sx, please call us back. Pt and husband expressed understanding of this information. ?

## 2021-12-03 ENCOUNTER — Inpatient Hospital Stay: Payer: Medicare Other

## 2021-12-03 ENCOUNTER — Inpatient Hospital Stay: Payer: Medicare Other | Admitting: Internal Medicine

## 2021-12-05 ENCOUNTER — Telehealth: Payer: Self-pay | Admitting: Internal Medicine

## 2021-12-05 NOTE — Telephone Encounter (Signed)
[facility] appts on 3/20 per after hours note"pt covid +" , MD + PA decision that pt does not need to come in prior to originally scheduled appts, notified pt husband pt can come in on 4/10.  ?

## 2021-12-10 ENCOUNTER — Other Ambulatory Visit: Payer: Self-pay | Admitting: Internal Medicine

## 2021-12-19 NOTE — Progress Notes (Deleted)
[facility] ?OFFICE PROGRESS NOTE ? ?[name], [place] ?[address] ?[place] [address] ? ?DIAGNOSIS: Stage IIIA (T1b, N2, M0) non-small cell lung cancer, adenocarcinoma presented with right upper lobe lung nodule as well as right upper paratracheal adenopathy and suspicious tiny nodule in the right middle lobe. ?The patient has PD-L1 expression was 90% ? ?PRIOR THERAPY: 1) Neoadjuvant systemic chemotherapy with carboplatin for AUC of 5, Alimta 500 mg/M2 and Keytruda 200 mg IV every 3 weeks.  First dose 05/30/2020. Status post 3 cycles. ?2) status post robotic assisted right thoracoscopy with right upper lobectomy and lymph node dissection under the care of Dr. Roxan Hockey on October 19, 2020.  There was residual tumor measuring 0.5 cm with lymph node metastasis involving 4R, 12 and hilar lymph nodes. ?3) Adjuvant systemic chemotherapy with carboplatin for AUC of 5 and Alimta 500 Mg/M2 every 3 weeks.  First dose was given December 11, 2020.  Status post 4 cycles.  Last dose of chemotherapy was given Feb 13, 2021. ? ?CURRENT THERAPY: Adjuvant immunotherapy with Atezolizumab 1200 Mg IV every 3 weeks.  First dose March 27, 2021.  Status post 11 cycles. ? ?INTERVAL HISTORY: ?[name] 71 y.o. female returns to the clinic for a follow up visit. The patient is feeling well today without any concerning complaints.  In the interval since last being seen, the patient was diagnosed with COVID-19.  She recovered well without any concerning adverse side effects.  The patient continues to tolerate treatment with adjuvant immunotherapy well without any adverse effects. Denies any fever, chills, night sweats, or weight loss. Denies any chest pain, shortness of breath, cough, or hemoptysis. Denies any nausea, vomiting, diarrhea, or unusual constipation. Denies any headache or visual changes. Denies any rashes or skin changes. We are monitoring her TSH closely. The patient is here today for  evaluation prior to starting cycle #12 ? ?MEDICAL HISTORY: ?Past Medical History:  ?Diagnosis Date  ? Cataract   ? COPD (chronic obstructive pulmonary disease) ([place])   ? ? ?ALLERGIES:  has No Known Allergies. ? ?MEDICATIONS:  ?Current Outpatient Medications  ?Medication Sig Dispense Refill  ? Acetaminophen (TYLENOL ARTHRITIS EXT RELIEF PO) Take 1 capsule by mouth daily as needed.    ? albuterol (PROVENTIL HFA;VENTOLIN HFA) 108 (90 Base) MCG/ACT inhaler Inhale 2 puffs into the lungs every 4 (four) hours as needed for wheezing or shortness of breath (cough, shortness of breath or wheezing.). 1 Inhaler 1  ? alendronate (FOSAMAX) 70 MG tablet Take 70 mg by mouth once a week.    ? Calcium Carbonate (CALCIUM 600 PO) Take 600 mg by mouth 2 (two) times daily.     ? Cholecalciferol (VITAMIN D3 PO) Take 2,000 Units by mouth daily.    ? fluticasone (FLONASE) 50 MCG/ACT nasal spray Place 2 sprays into both nostrils daily. 16 g 12  ? Fluticasone-Salmeterol (ADVAIR) 250-50 MCG/DOSE AEPB Inhale 1 puff into the lungs 2 (two) times daily.     ? guaiFENesin (MUCINEX) 600 MG 12 hr tablet Take 1 tablet (600 mg total) by mouth 2 (two) times daily as needed for cough or to loosen phlegm. (Patient not taking: Reported on 07/30/2021)    ? HYDROcodone-acetaminophen (NORCO/VICODIN) 5-325 MG tablet Take 1-2 tablets by mouth every 6 (six) hours as needed.    ? levothyroxine (SYNTHROID) 75 MCG tablet TAKE 1 TABLET BY MOUTH EVERY DAY BEFORE BREAKFAST 30 tablet 2  ? Multiple Vitamin (MULTIVITAMIN) tablet Take 1 tablet by mouth  daily.    ? ?No current facility-administered medications for this visit.  ? ? ?SURGICAL HISTORY:  ?Past Surgical History:  ?Procedure Laterality Date  ? APPENDECTOMY  2008  ? COLON SURGERY  2008  ? colostomy-infection post op appendectomy  ? COLOSTOMY REVERSAL  2009  ? INTERCOSTAL NERVE BLOCK Right 10/19/2020  ? Procedure: INTERCOSTAL NERVE BLOCK;  Surgeon: [name], MD;  Location: [facility];  Service: Thoracic;   Laterality: Right;  ? NODE DISSECTION Right 10/19/2020  ? Procedure: NODE DISSECTION;  Surgeon: [name], MD;  Location: [facility];  Service: Thoracic;  Laterality: Right;  ? VIDEO BRONCHOSCOPY WITH ENDOBRONCHIAL NAVIGATION N/A 05/08/2020  ? Procedure: VIDEO BRONCHOSCOPY WITH ENDOBRONCHIAL NAVIGATION;  Surgeon: [name], MD;  Location: [facility];  Service: Thoracic;  Laterality: N/A;  ? VIDEO BRONCHOSCOPY WITH ENDOBRONCHIAL ULTRASOUND N/A 05/08/2020  ? Procedure: VIDEO BRONCHOSCOPY WITH ENDOBRONCHIAL ULTRASOUND;  Surgeon: [name], MD;  Location: [facility];  Service: Thoracic;  Laterality: N/A;  ? ? ?REVIEW OF SYSTEMS:   ?Review of Systems  ?Constitutional: Negative for appetite change, chills, fatigue, fever and unexpected weight change.  ?HENT:   Negative for mouth sores, nosebleeds, sore throat and trouble swallowing.   ?Eyes: Negative for eye problems and icterus.  ?Respiratory: Negative for cough, hemoptysis, shortness of breath and wheezing.   ?Cardiovascular: Negative for chest pain and leg swelling.  ?Gastrointestinal: Negative for abdominal pain, constipation, diarrhea, nausea and vomiting.  ?Genitourinary: Negative for bladder incontinence, difficulty urinating, dysuria, frequency and hematuria.   ?Musculoskeletal: Negative for back pain, gait problem, neck pain and neck stiffness.  ?Skin: Negative for itching and rash.  ?Neurological: Negative for dizziness, extremity weakness, gait problem, headaches, light-headedness and seizures.  ?Hematological: Negative for adenopathy. Does not bruise/bleed easily.  ?Psychiatric/Behavioral: Negative for confusion, depression and sleep disturbance. The patient is not nervous/anxious.   ? ? ?PHYSICAL EXAMINATION:  ?There were no vitals taken for this visit. ? ?ECOG PERFORMANCE STATUS: {CHL ONC ECOG HA:[id number]} ? ?Physical Exam  ?Constitutional: Oriented to person, place, and time and well-developed, well-nourished, and in no distress. No  distress.  ?HENT:  ?Head: Normocephalic and atraumatic.  ?Mouth/Throat: Oropharynx is clear and moist. No oropharyngeal exudate.  ?Eyes: Conjunctivae are normal. Right eye exhibits no discharge. Left eye exhibits no discharge. No scleral icterus.  ?Neck: Normal range of motion. Neck supple.  ?Cardiovascular: Normal rate, regular rhythm, normal heart sounds and intact distal pulses.   ?Pulmonary/Chest: Effort normal and breath sounds normal. No respiratory distress. No wheezes. No rales.  ?Abdominal: Soft. Bowel sounds are normal. Exhibits no distension and no mass. There is no tenderness.  ?Musculoskeletal: Normal range of motion. Exhibits no edema.  ?Lymphadenopathy:  ?  No cervical adenopathy.  ?Neurological: Alert and oriented to person, place, and time. Exhibits normal muscle tone. Gait normal. Coordination normal.  ?Skin: Skin is warm and dry. No rash noted. Not diaphoretic. No erythema. No pallor.  ?Psychiatric: Mood, memory and judgment normal.  ?Vitals reviewed. ? ?LABORATORY DATA: ?Lab Results  ?Component Value Date  ? WBC 9.0 11/14/2021  ? HGB 12.1 11/14/2021  ? HCT 36.2 11/14/2021  ? MCV 94.0 11/14/2021  ? PLT 220 11/14/2021  ? ? ?  Chemistry   ?   ?Component Value Date/Time  ? NA 142 11/14/2021 0823  ? NA 135 10/29/2017 1131  ? K 3.6 11/14/2021 0823  ? CL 107 11/14/2021 0823  ? CO2 27 11/14/2021 0823  ? BUN 11 11/14/2021 0823  ? BUN  5 (L) 10/29/2017 1131  ? CREATININE 0.54 11/14/2021 0823  ?    ?Component Value Date/Time  ? CALCIUM 9.4 11/14/2021 0823  ? ALKPHOS 43 11/14/2021 0823  ? AST 16 11/14/2021 0823  ? ALT 13 11/14/2021 0823  ? BILITOT 0.5 11/14/2021 6605  ?  ? ? ? ?RADIOGRAPHIC STUDIES: ? ?No results found. ? ? ?ASSESSMENT/PLAN:  ?This is a very pleasant 71 year old Caucasian female diagnosed with a stage IIIa (T1b, N2, M0) non-small cell lung cancer, adenocarcinoma presented with right upper lobe lung nodule in addition to mediastinal lymphadenopathy.  PD-L1 expression 90%. ?  ?The patient  underwent a course of neoadjuvant treatment with carboplatin for AUC of 5, Alimta 500 mg/M2 and Keytruda 200 mg IV every 3 weeks for 3 cycles.   ?She tolerated this treatment well with no concerning adverse effect exc

## 2021-12-24 ENCOUNTER — Ambulatory Visit: Payer: Medicare Other

## 2021-12-24 ENCOUNTER — Ambulatory Visit: Payer: Medicare Other | Admitting: Physician Assistant

## 2021-12-24 ENCOUNTER — Other Ambulatory Visit: Payer: Medicare Other

## 2021-12-26 NOTE — Progress Notes (Deleted)
[facility] ?OFFICE PROGRESS NOTE ? ?[name], [place] ?[address] ?[place] [address] ? ?DIAGNOSIS: Stage IIIA (T1b, N2, M0) non-small cell lung cancer, adenocarcinoma presented with right upper lobe lung nodule as well as right upper paratracheal adenopathy and suspicious tiny nodule in the right middle lobe. ?The patient has PD-L1 expression was 90% ? ?PRIOR THERAPY: ?1) Neoadjuvant systemic chemotherapy with carboplatin for AUC of 5, Alimta 500 mg/M2 and Keytruda 200 mg IV every 3 weeks.  First dose 05/30/2020. Status post 3 cycles. ?2) status post robotic assisted right thoracoscopy with right upper lobectomy and lymph node dissection under the care of Dr. Roxan Hockey on October 19, 2020.  There was residual tumor measuring 0.5 cm with lymph node metastasis involving 4R, 12 and hilar lymph nodes. ?3) Adjuvant systemic chemotherapy with carboplatin for AUC of 5 and Alimta 500 Mg/M2 every 3 weeks.  First dose was given December 11, 2020.  Status post 4 cycles.  Last dose of chemotherapy was given Feb 13, 2021. ? ?CURRENT THERAPY: Adjuvant immunotherapy with Atezolizumab 1200 Mg IV every 3 weeks.  First dose March 27, 2021.  Status post 11 cycles. ? ?INTERVAL HISTORY: ?[name] 71 y.o. female returns to the clinic for a follow up visit.  Patient was last seen in clinic on 11/14/2021.  In the interval since last being seen, the patient was diagnosed with COVID-19.  Her treatment has been on hold for a few weeks.  She has recovered well and denies any concerning complaints today.  The patient is feeling well today without any concerning complaints. The patient continues to tolerate treatment with adjuvant immunotherapy well without any adverse effects. Denies any fever, chills, night sweats, or weight loss. Denies any chest pain, shortness of breath, cough, or hemoptysis. Denies any nausea, vomiting, diarrhea, or unusual constipation. Denies any headache or visual changes. Denies  any rashes or skin changes. We are monitoring her TSH closely. She notes for her back pain she has stopped taking gabapentin and now only takes 1 tylenol arthritis before bedtime to help her sleep due to the back pain. The patient is here today for evaluation prior to starting cycle # 12 ? ?MEDICAL HISTORY: ?Past Medical History:  ?Diagnosis Date  ? Cataract   ? COPD (chronic obstructive pulmonary disease) ([facility])   ? ? ?ALLERGIES:  has No Known Allergies. ? ?MEDICATIONS:  ?Current Outpatient Medications  ?Medication Sig Dispense Refill  ? Acetaminophen (TYLENOL ARTHRITIS EXT RELIEF PO) Take 1 capsule by mouth daily as needed.    ? albuterol (PROVENTIL HFA;VENTOLIN HFA) 108 (90 Base) MCG/ACT inhaler Inhale 2 puffs into the lungs every 4 (four) hours as needed for wheezing or shortness of breath (cough, shortness of breath or wheezing.). 1 Inhaler 1  ? alendronate (FOSAMAX) 70 MG tablet Take 70 mg by mouth once a week.    ? Calcium Carbonate (CALCIUM 600 PO) Take 600 mg by mouth 2 (two) times daily.     ? Cholecalciferol (VITAMIN D3 PO) Take 2,000 Units by mouth daily.    ? fluticasone (FLONASE) 50 MCG/ACT nasal spray Place 2 sprays into both nostrils daily. 16 g 12  ? Fluticasone-Salmeterol (ADVAIR) 250-50 MCG/DOSE AEPB Inhale 1 puff into the lungs 2 (two) times daily.     ? guaiFENesin (MUCINEX) 600 MG 12 hr tablet Take 1 tablet (600 mg total) by mouth 2 (two) times daily as needed for cough or to loosen phlegm. (Patient not taking: Reported on 07/30/2021)    ?  HYDROcodone-acetaminophen (NORCO/VICODIN) 5-325 MG tablet Take 1-2 tablets by mouth every 6 (six) hours as needed.    ? levothyroxine (SYNTHROID) 75 MCG tablet TAKE 1 TABLET BY MOUTH EVERY DAY BEFORE BREAKFAST 30 tablet 2  ? Multiple Vitamin (MULTIVITAMIN) tablet Take 1 tablet by mouth daily.    ? ?No current facility-administered medications for this visit.  ? ? ?SURGICAL HISTORY:  ?Past Surgical History:  ?Procedure Laterality Date  ? APPENDECTOMY  2008  ?  COLON SURGERY  2008  ? colostomy-infection post op appendectomy  ? COLOSTOMY REVERSAL  2009  ? INTERCOSTAL NERVE BLOCK Right 10/19/2020  ? Procedure: INTERCOSTAL NERVE BLOCK;  Surgeon: [name], MD;  Location: [facility];  Service: Thoracic;  Laterality: Right;  ? NODE DISSECTION Right 10/19/2020  ? Procedure: NODE DISSECTION;  Surgeon: [name], MD;  Location: [facility];  Service: Thoracic;  Laterality: Right;  ? VIDEO BRONCHOSCOPY WITH ENDOBRONCHIAL NAVIGATION N/A 05/08/2020  ? Procedure: VIDEO BRONCHOSCOPY WITH ENDOBRONCHIAL NAVIGATION;  Surgeon: [name], MD;  Location: [facility];  Service: Thoracic;  Laterality: N/A;  ? VIDEO BRONCHOSCOPY WITH ENDOBRONCHIAL ULTRASOUND N/A 05/08/2020  ? Procedure: VIDEO BRONCHOSCOPY WITH ENDOBRONCHIAL ULTRASOUND;  Surgeon: [name], MD;  Location: [facility];  Service: Thoracic;  Laterality: N/A;  ? ? ?REVIEW OF SYSTEMS:   ?Review of Systems  ?Constitutional: Negative for appetite change, chills, fatigue, fever and unexpected weight change.  ?HENT:   Negative for mouth sores, nosebleeds, sore throat and trouble swallowing.   ?Eyes: Negative for eye problems and icterus.  ?Respiratory: Negative for cough, hemoptysis, shortness of breath and wheezing.   ?Cardiovascular: Negative for chest pain and leg swelling.  ?Gastrointestinal: Negative for abdominal pain, constipation, diarrhea, nausea and vomiting.  ?Genitourinary: Negative for bladder incontinence, difficulty urinating, dysuria, frequency and hematuria.   ?Musculoskeletal: Negative for back pain, gait problem, neck pain and neck stiffness.  ?Skin: Negative for itching and rash.  ?Neurological: Negative for dizziness, extremity weakness, gait problem, headaches, light-headedness and seizures.  ?Hematological: Negative for adenopathy. Does not bruise/bleed easily.  ?Psychiatric/Behavioral: Negative for confusion, depression and sleep disturbance. The patient is not nervous/anxious.   ? ? ?PHYSICAL  EXAMINATION:  ?There were no vitals taken for this visit. ? ?ECOG PERFORMANCE STATUS: {CHL ONC ECOG DG:[id number]} ? ?Physical Exam  ?Constitutional: Oriented to person, place, and time and well-developed, well-nourished, and in no distress. No distress.  ?HENT:  ?Head: Normocephalic and atraumatic.  ?Mouth/Throat: Oropharynx is clear and moist. No oropharyngeal exudate.  ?Eyes: Conjunctivae are normal. Right eye exhibits no discharge. Left eye exhibits no discharge. No scleral icterus.  ?Neck: Normal range of motion. Neck supple.  ?Cardiovascular: Normal rate, regular rhythm, normal heart sounds and intact distal pulses.   ?Pulmonary/Chest: Effort normal and breath sounds normal. No respiratory distress. No wheezes. No rales.  ?Abdominal: Soft. Bowel sounds are normal. Exhibits no distension and no mass. There is no tenderness.  ?Musculoskeletal: Normal range of motion. Exhibits no edema.  ?Lymphadenopathy:  ?  No cervical adenopathy.  ?Neurological: Alert and oriented to person, place, and time. Exhibits normal muscle tone. Gait normal. Coordination normal.  ?Skin: Skin is warm and dry. No rash noted. Not diaphoretic. No erythema. No pallor.  ?Psychiatric: Mood, memory and judgment normal.  ?Vitals reviewed. ? ?LABORATORY DATA: ?Lab Results  ?Component Value Date  ? WBC 9.0 11/14/2021  ? HGB 12.1 11/14/2021  ? HCT 36.2 11/14/2021  ? MCV 94.0 11/14/2021  ? PLT 220 11/14/2021  ? ? ?  Chemistry   ?   ?Component Value Date/Time  ? NA 142 11/14/2021 0823  ? NA 135 10/29/2017 1131  ? K 3.6 11/14/2021 0823  ? CL 107 11/14/2021 0823  ? CO2 27 11/14/2021 0823  ? BUN 11 11/14/2021 0823  ? BUN 5 (L) 10/29/2017 1131  ? CREATININE 0.54 11/14/2021 0823  ?    ?Component Value Date/Time  ? CALCIUM 9.4 11/14/2021 0823  ? ALKPHOS 43 11/14/2021 0823  ? AST 16 11/14/2021 0823  ? ALT 13 11/14/2021 0823  ? BILITOT 0.5 11/14/2021 7921  ?  ? ? ? ?RADIOGRAPHIC STUDIES: ? ?No results found. ? ? ?ASSESSMENT/PLAN:  ?This is a very pleasant  71 year old Caucasian female diagnosed with a stage IIIa (T1b, N2, M0) non-small cell lung cancer, adenocarcinoma presented with right upper lobe lung nodule in addition to mediastinal lymphadenopathy.

## 2021-12-28 ENCOUNTER — Telehealth: Payer: Self-pay | Admitting: Internal Medicine

## 2021-12-28 NOTE — Telephone Encounter (Signed)
Per 4/14 phone line.  Pt wanted to cancel appointments. Called pt husband to confirm congelations.  Pt husband confirmed they would call later to schedule  ?

## 2021-12-30 ENCOUNTER — Other Ambulatory Visit: Payer: Self-pay

## 2021-12-30 ENCOUNTER — Emergency Department (HOSPITAL_COMMUNITY): Payer: Medicare Other

## 2021-12-30 ENCOUNTER — Inpatient Hospital Stay (HOSPITAL_COMMUNITY): Payer: Medicare Other

## 2021-12-30 ENCOUNTER — Inpatient Hospital Stay (HOSPITAL_COMMUNITY)
Admission: EM | Admit: 2021-12-30 | Discharge: 2022-01-03 | DRG: 054 | Disposition: A | Discharge status: Rehabilitation Facility | Payer: Medicare Other | Attending: Internal Medicine | Admitting: Internal Medicine

## 2021-12-30 DIAGNOSIS — G9349 Other encephalopathy: Secondary | ICD-10-CM | POA: Diagnosis present

## 2021-12-30 DIAGNOSIS — R296 Repeated falls: Secondary | ICD-10-CM | POA: Diagnosis present

## 2021-12-30 DIAGNOSIS — E039 Hypothyroidism, unspecified: Secondary | ICD-10-CM | POA: Diagnosis present

## 2021-12-30 DIAGNOSIS — G936 Cerebral edema: Secondary | ICD-10-CM | POA: Diagnosis present

## 2021-12-30 DIAGNOSIS — Z79890 Hormone replacement therapy (postmenopausal): Secondary | ICD-10-CM

## 2021-12-30 DIAGNOSIS — Z87891 Personal history of nicotine dependence: Secondary | ICD-10-CM

## 2021-12-30 DIAGNOSIS — Z7951 Long term (current) use of inhaled steroids: Secondary | ICD-10-CM

## 2021-12-30 DIAGNOSIS — Z818 Family history of other mental and behavioral disorders: Secondary | ICD-10-CM

## 2021-12-30 DIAGNOSIS — C7931 Secondary malignant neoplasm of brain: Principal | ICD-10-CM

## 2021-12-30 DIAGNOSIS — F32A Depression, unspecified: Secondary | ICD-10-CM

## 2021-12-30 DIAGNOSIS — R2689 Other abnormalities of gait and mobility: Secondary | ICD-10-CM | POA: Diagnosis not present

## 2021-12-30 DIAGNOSIS — C771 Secondary and unspecified malignant neoplasm of intrathoracic lymph nodes: Secondary | ICD-10-CM | POA: Diagnosis present

## 2021-12-30 DIAGNOSIS — D72828 Other elevated white blood cell count: Secondary | ICD-10-CM | POA: Diagnosis not present

## 2021-12-30 DIAGNOSIS — C3490 Malignant neoplasm of unspecified part of unspecified bronchus or lung: Principal | ICD-10-CM

## 2021-12-30 DIAGNOSIS — Z8616 Personal history of COVID-19: Secondary | ICD-10-CM | POA: Diagnosis not present

## 2021-12-30 DIAGNOSIS — Z9221 Personal history of antineoplastic chemotherapy: Secondary | ICD-10-CM

## 2021-12-30 DIAGNOSIS — R531 Weakness: Secondary | ICD-10-CM | POA: Diagnosis present

## 2021-12-30 DIAGNOSIS — G9389 Other specified disorders of brain: Secondary | ICD-10-CM | POA: Diagnosis not present

## 2021-12-30 DIAGNOSIS — K5901 Slow transit constipation: Secondary | ICD-10-CM | POA: Diagnosis not present

## 2021-12-30 DIAGNOSIS — W19XXXA Unspecified fall, initial encounter: Secondary | ICD-10-CM | POA: Diagnosis present

## 2021-12-30 DIAGNOSIS — Z7983 Long term (current) use of bisphosphonates: Secondary | ICD-10-CM

## 2021-12-30 DIAGNOSIS — K5900 Constipation, unspecified: Secondary | ICD-10-CM | POA: Diagnosis present

## 2021-12-30 DIAGNOSIS — J449 Chronic obstructive pulmonary disease, unspecified: Secondary | ICD-10-CM | POA: Diagnosis present

## 2021-12-30 DIAGNOSIS — Z9049 Acquired absence of other specified parts of digestive tract: Secondary | ICD-10-CM | POA: Diagnosis not present

## 2021-12-30 DIAGNOSIS — Z800 Family history of malignant neoplasm of digestive organs: Secondary | ICD-10-CM | POA: Diagnosis not present

## 2021-12-30 DIAGNOSIS — R5381 Other malaise: Secondary | ICD-10-CM | POA: Diagnosis not present

## 2021-12-30 DIAGNOSIS — C3411 Malignant neoplasm of upper lobe, right bronchus or lung: Secondary | ICD-10-CM | POA: Diagnosis present

## 2021-12-30 DIAGNOSIS — G9340 Encephalopathy, unspecified: Secondary | ICD-10-CM

## 2021-12-30 DIAGNOSIS — Z79899 Other long term (current) drug therapy: Secondary | ICD-10-CM

## 2021-12-30 LAB — CBC WITH DIFFERENTIAL/PLATELET
Abs Immature Granulocytes: 0.02 10*3/uL (ref 0.00–0.07)
Basophils Absolute: 0 10*3/uL (ref 0.0–0.1)
Basophils Relative: 0 %
Eosinophils Absolute: 0.1 10*3/uL (ref 0.0–0.5)
Eosinophils Relative: 1 %
HCT: 37.3 % (ref 36.0–46.0)
Hemoglobin: 12.2 g/dL (ref 12.0–15.0)
Immature Granulocytes: 0 %
Lymphocytes Relative: 25 %
Lymphs Abs: 1.5 10*3/uL (ref 0.7–4.0)
MCH: 31.7 pg (ref 26.0–34.0)
MCHC: 32.7 g/dL (ref 30.0–36.0)
MCV: 96.9 fL (ref 80.0–100.0)
Monocytes Absolute: 0.5 10*3/uL (ref 0.1–1.0)
Monocytes Relative: 9 %
Neutro Abs: 3.9 10*3/uL (ref 1.7–7.7)
Neutrophils Relative %: 65 %
Platelets: 185 10*3/uL (ref 150–400)
RBC: 3.85 MIL/uL — ABNORMAL LOW (ref 3.87–5.11)
RDW: 12 % (ref 11.5–15.5)
WBC: 6.1 10*3/uL (ref 4.0–10.5)
nRBC: 0 % (ref 0.0–0.2)

## 2021-12-30 LAB — COMPREHENSIVE METABOLIC PANEL
ALT: 15 U/L (ref 0–44)
AST: 24 U/L (ref 15–41)
Albumin: 3.5 g/dL (ref 3.5–5.0)
Alkaline Phosphatase: 41 U/L (ref 38–126)
Anion gap: 3 — ABNORMAL LOW (ref 5–15)
BUN: 11 mg/dL (ref 8–23)
CO2: 26 mmol/L (ref 22–32)
Calcium: 7.8 mg/dL — ABNORMAL LOW (ref 8.9–10.3)
Chloride: 110 mmol/L (ref 98–111)
Creatinine, Ser: 0.43 mg/dL — ABNORMAL LOW (ref 0.44–1.00)
GFR, Estimated: >60 mL/min (ref >60)
Glucose, Bld: 88 mg/dL (ref 70–99)
Potassium: 4.7 mmol/L (ref 3.5–5.1)
Sodium: 139 mmol/L (ref 135–145)
Total Bilirubin: 0.9 mg/dL (ref 0.3–1.2)
Total Protein: 5.9 g/dL — ABNORMAL LOW (ref 6.5–8.1)

## 2021-12-30 LAB — URINALYSIS, ROUTINE W REFLEX MICROSCOPIC
Bilirubin Urine: NEGATIVE
Glucose, UA: NEGATIVE mg/dL
Hgb urine dipstick: NEGATIVE
Ketones, ur: 80 mg/dL — AB
Leukocytes,Ua: NEGATIVE
Nitrite: NEGATIVE
Protein, ur: NEGATIVE mg/dL
Specific Gravity, Urine: 1.035 — ABNORMAL HIGH (ref 1.005–1.030)
pH: 6 (ref 5.0–8.0)

## 2021-12-30 LAB — TSH: TSH: 1.054 u[IU]/mL (ref 0.350–4.500)

## 2021-12-30 LAB — MAGNESIUM: Magnesium: 2.2 mg/dL (ref 1.7–2.4)

## 2021-12-30 IMAGING — CR DG CHEST 2V
2 series · 2 of 2 positions shown · non-contrast
Comparison: Chest CT [DATE] and earlier.

CLINICAL DATA: 71-year-old female with lung cancer. Altered mental
status and dizziness. Right hemisphere brain metastasis on CT today.
Fall.

EXAM:
CHEST - 2 VIEW

[w chest lat]
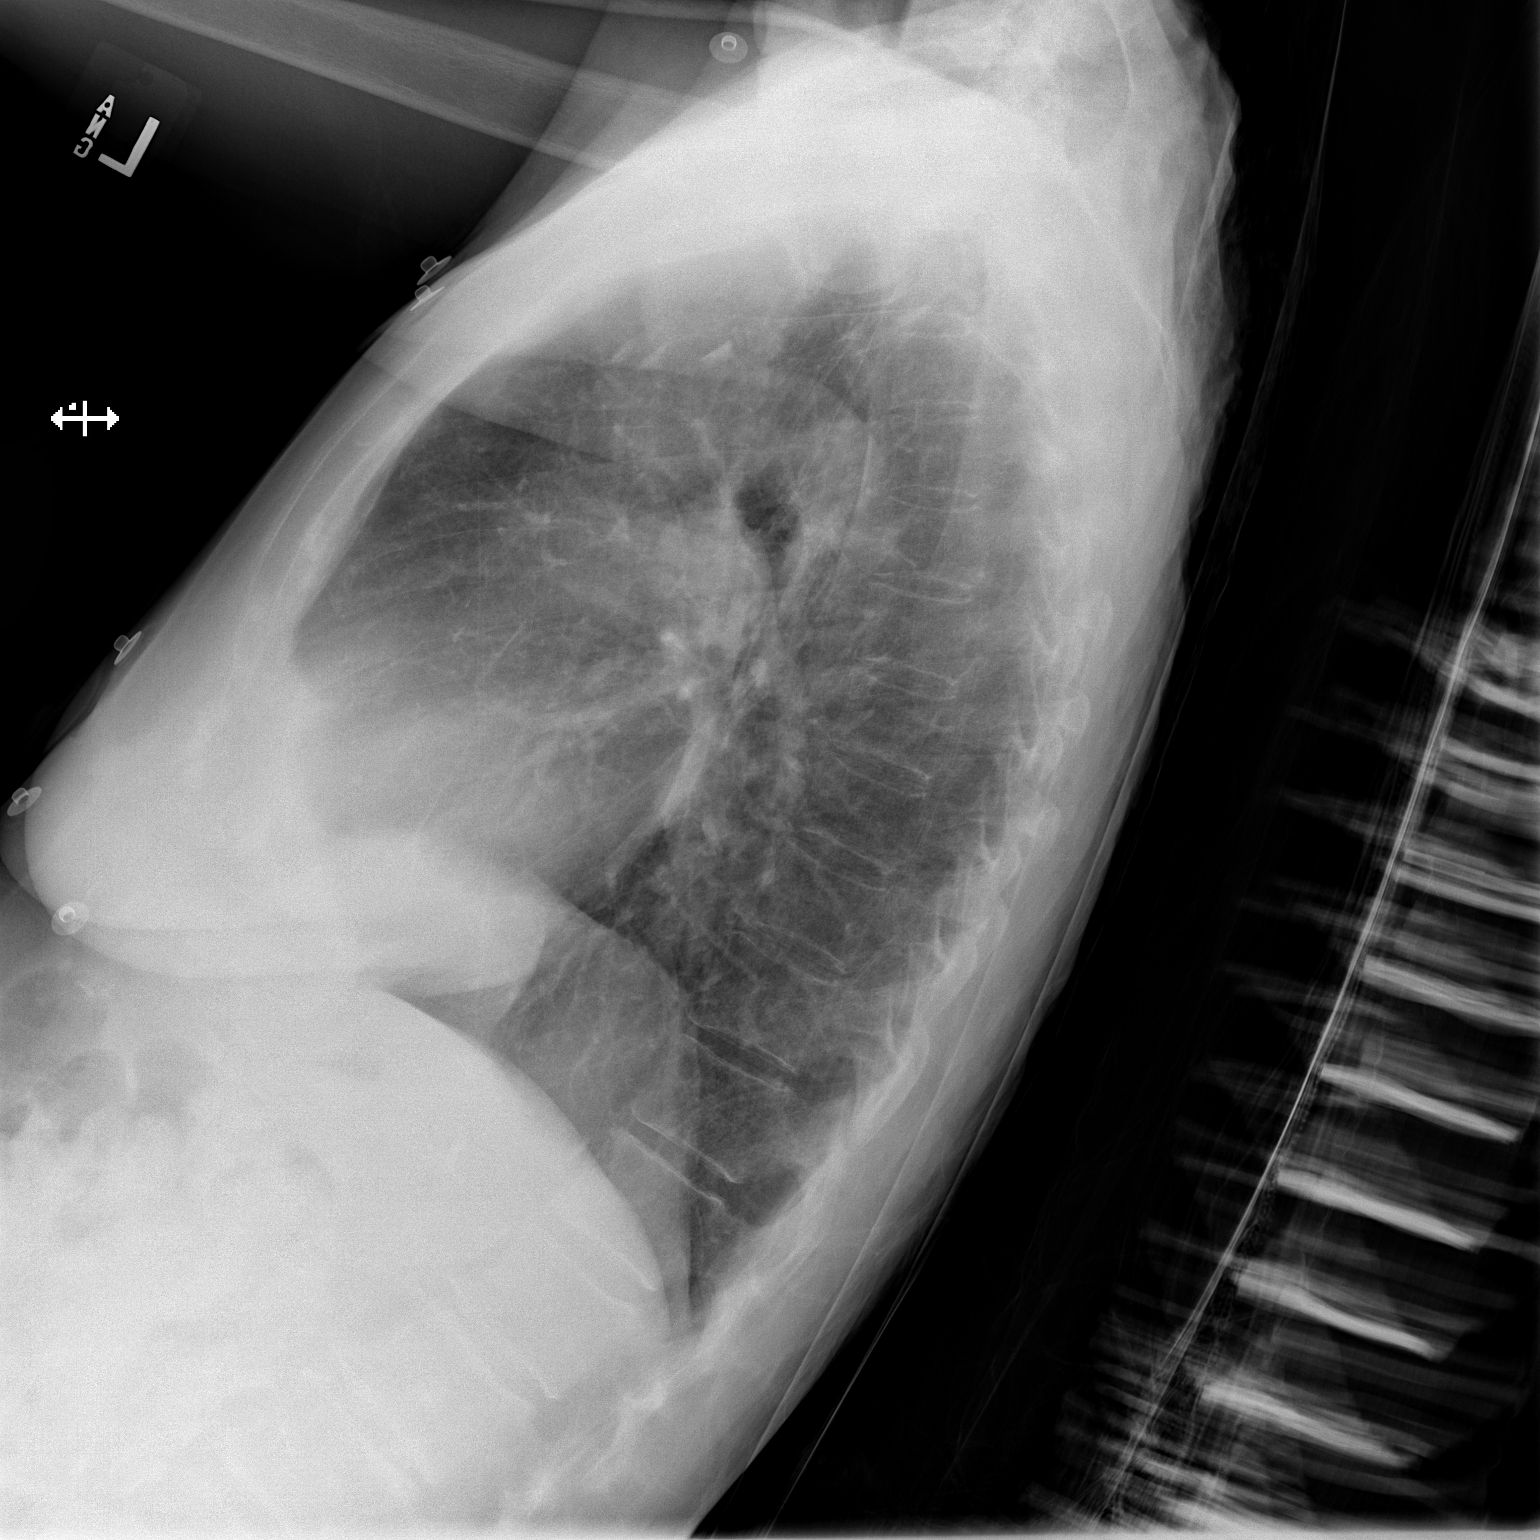

[x chest ap]
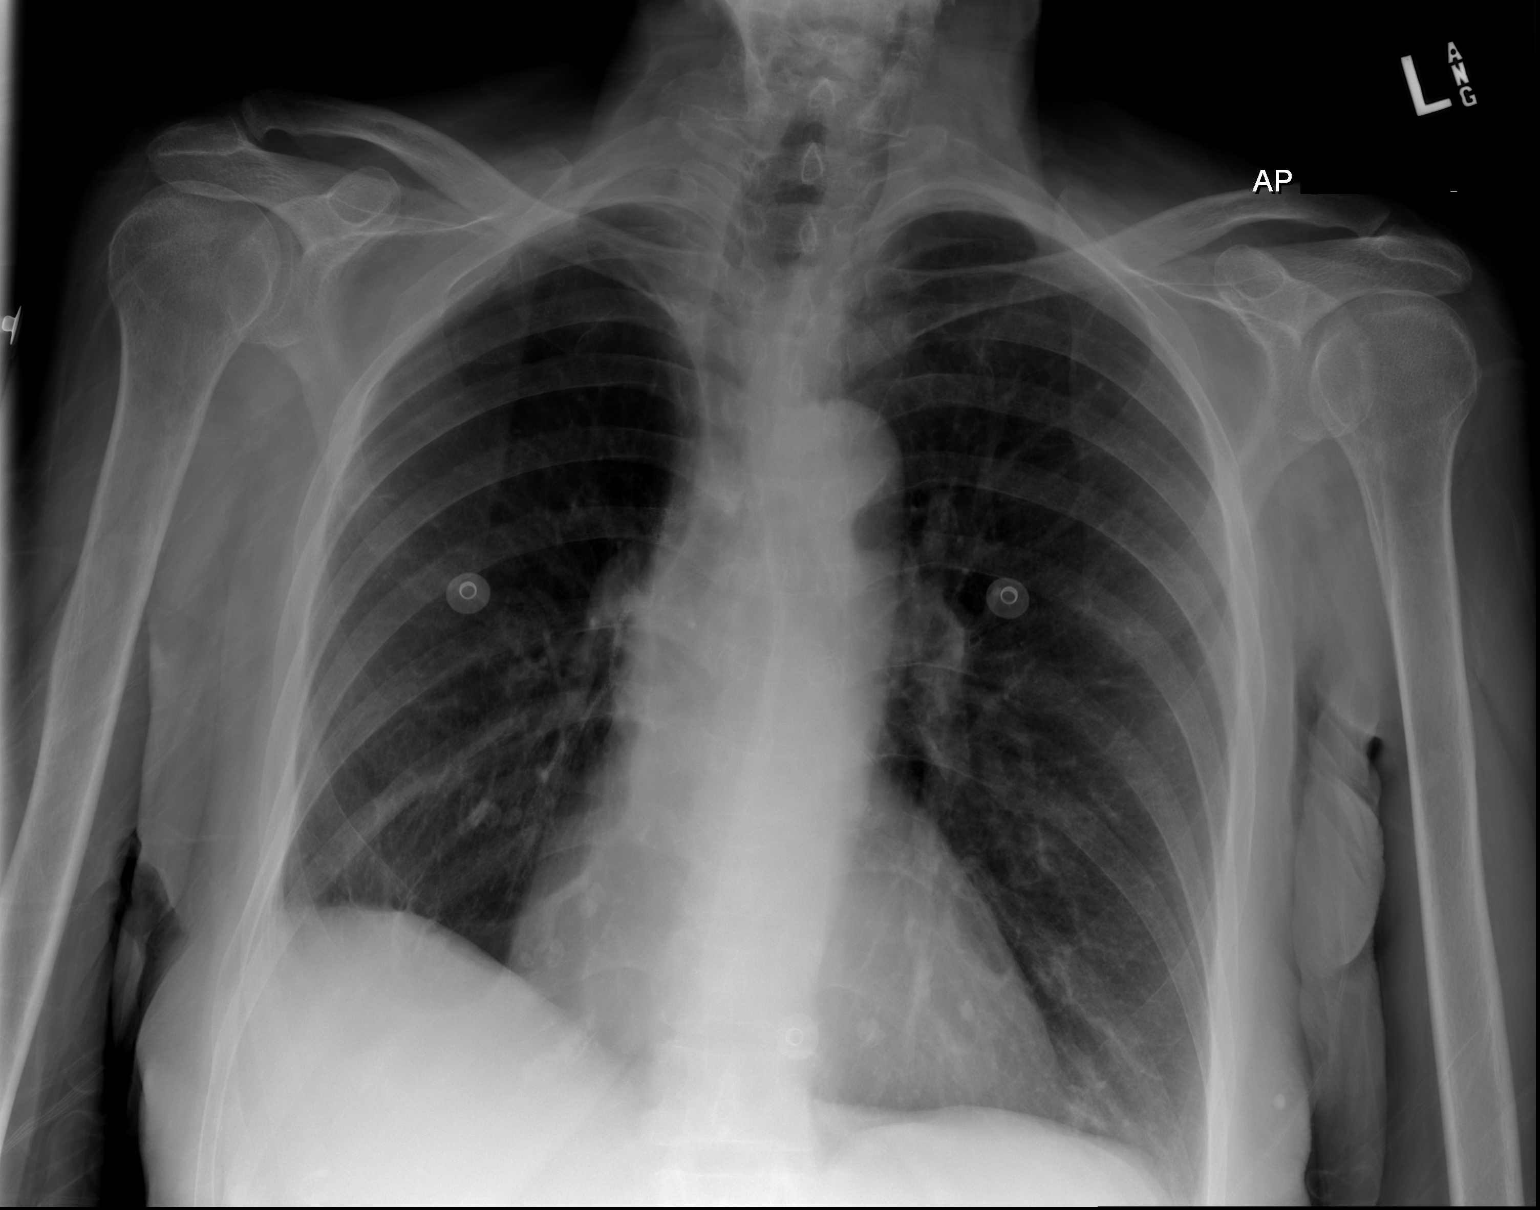

[2 of 2 positions shown; findings below may reference images not displayed]

FINDINGS: Semi upright AP and lateral views of the chest at [1W] hours.
Chronic postoperative volume reduction in the right lung appears
stable. Stable cardiac size and mediastinal contours. Visualized
tracheal air column is within normal limits. No pneumothorax,
pulmonary edema, pleural effusion or acute pulmonary opacity
identified. No acute osseous abnormality identified. Negative
visible bowel gas.
IMPRESSION: Post treatment changes to the right lung. No acute cardiopulmonary
abnormality or acute traumatic injury identified.

## 2021-12-30 IMAGING — MR MR HEAD WO/W CM
14 series · 48 of 48 positions shown · IV contrast (gadavist)
Comparison: CT head without and with contrast [DATE]. MR head
without and with contrast [DATE]

CLINICAL DATA: Brain/CNS neoplasm. Fall today. Dizziness. Personal
history of lung cancer. Abnormal CT of the head.

EXAM:
MRI HEAD WITHOUT AND WITH CONTRAST
TECHNIQUE: Multiplanar, multiecho pulse sequences of the brain and surrounding
structures were obtained without and with intravenous contrast.
CONTRAST:  5mL GADAVIST GADOBUTROL 1 MMOL/ML IV SOLN

[Series 5: T1 · sagittal · 3.0mm · 0.75mm/px · 2 of 47 slices shown (1 of 3)]
[im 1/47]
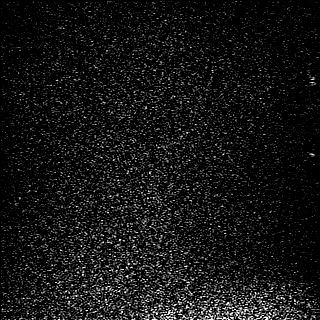
[im 47/47]
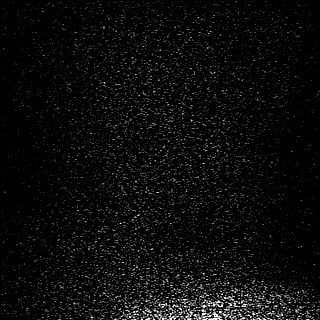

[Series 6: DWI · axial · 3.0mm · 1.87mm/px · z∈[-84,+86]mm · 5 of 111 slices shown (1 of 4)]
[im 1/111]
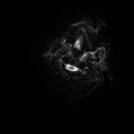
[im 28/111]
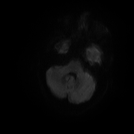
[im 56/111]
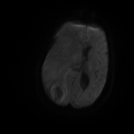
[im 83/111]
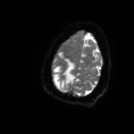
[im 111/111]
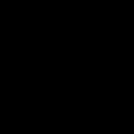

[Series 7: DWI · axial · 3.0mm · 1.87mm/px · z∈[-84,+71]mm · 2 of 53 slices shown (2 of 4)]
[im 1/53]
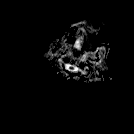
[im 53/53]
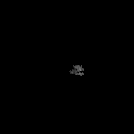

[Series 8: T2 · axial · 5.0mm · 0.72mm/px · 1 of 29 slices shown]
[im 1/29]
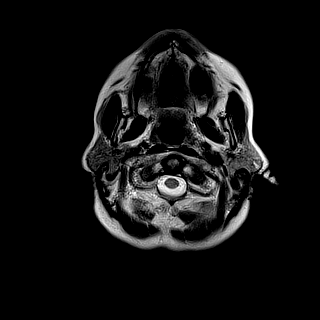

[Series 9: DWI · axial · 3.0mm · 1.87mm/px · z∈[-84,+86]mm · 5 of 113 slices shown (3 of 4)]
[im 1/113]
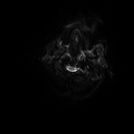
[im 29/113]
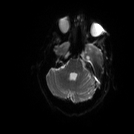
[im 57/113]
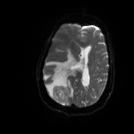
[im 85/113]
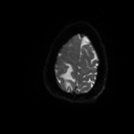
[im 113/113]
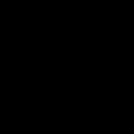

[Series 10: DWI · axial · 3.0mm · 1.87mm/px · z∈[-84,+68]mm · 2 of 52 slices shown (4 of 4)]
[im 1/52]
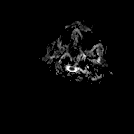
[im 52/52]
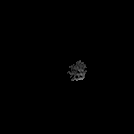

[Series 11: swi_images · axial · 3.0mm · 0.90mm/px · z∈[-81,+83]mm · 2 of 56 slices shown]
[im 1/56]
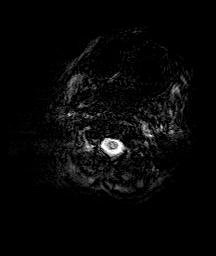
[im 56/56]
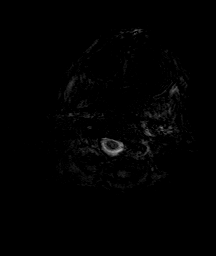

[Series 13: FLAIR · sagittal · 1.0mm · 0.51mm/px · 7 of 176 slices shown]
[im 1/176]
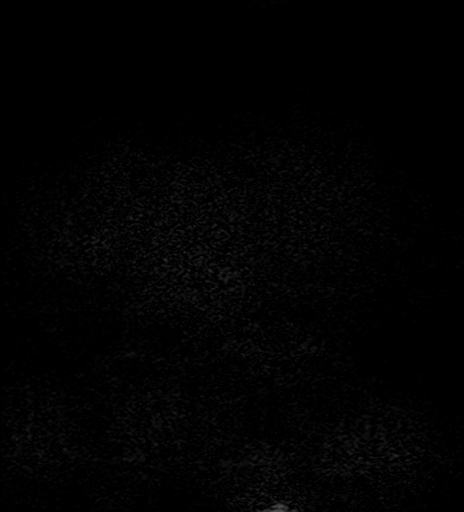
[im 30/176]
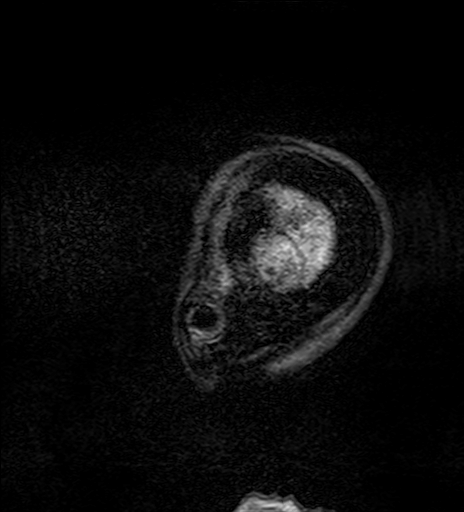
[im 59/176]
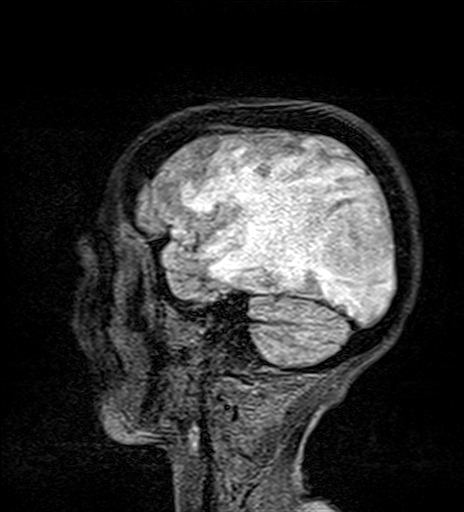
[im 88/176]
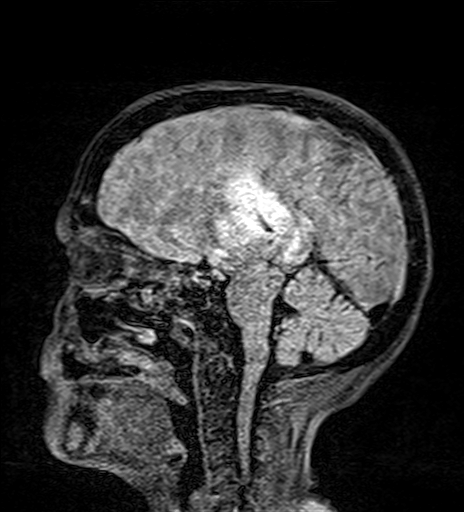
[im 117/176]
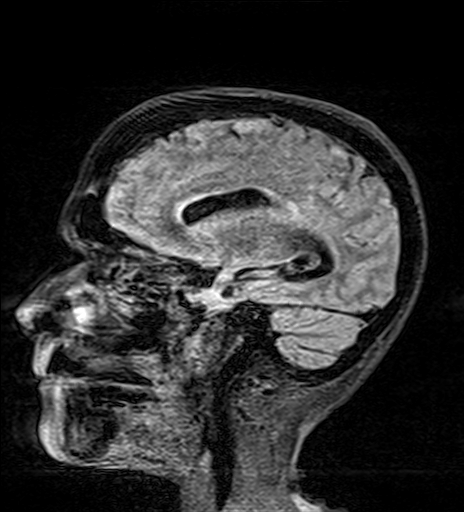
[im 146/176]
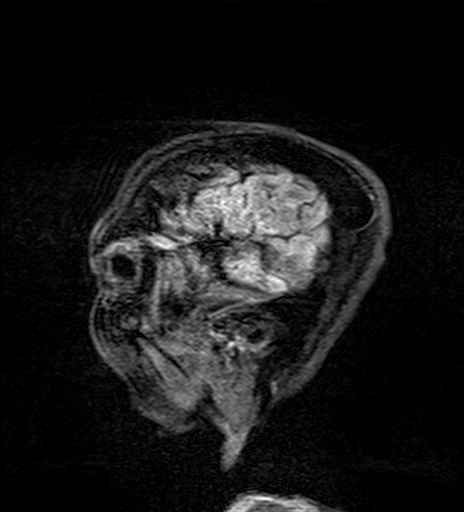
[im 176/176]
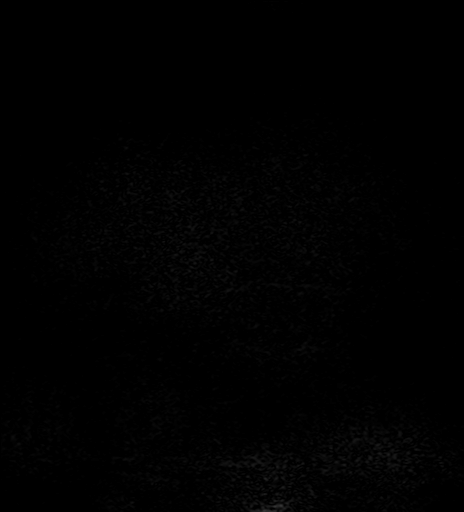

[Series 14: T1 · axial · 1.0mm · 1.02mm/px · z∈[-105,+70]mm · 7 of 176 slices shown (2 of 3)]
[im 1/176]
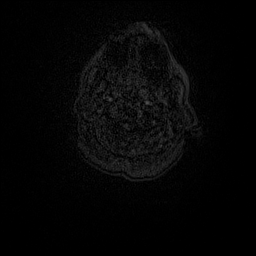
[im 30/176]
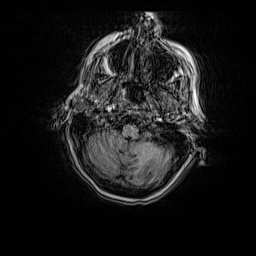
[im 59/176]
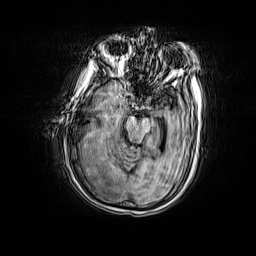
[im 88/176]
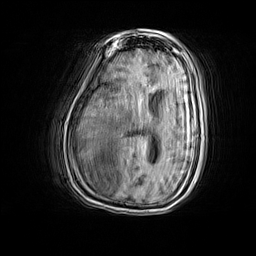
[im 117/176]
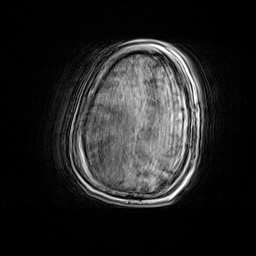
[im 146/176]
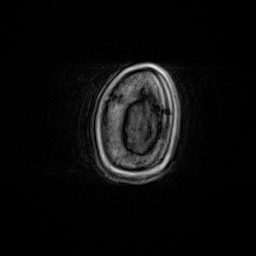
[im 176/176]
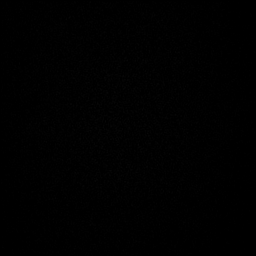

[Series 15: T1 · sagittal · 3.0mm · 0.75mm/px · 2 of 47 slices shown (3 of 3)]
[im 1/47]
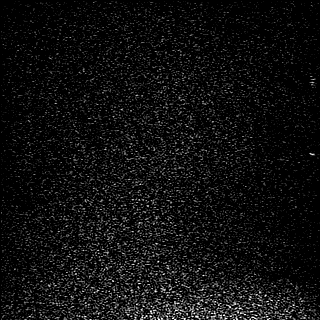
[im 47/47]
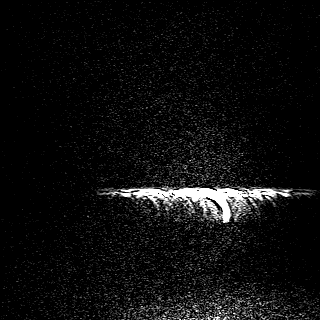

[Series 16: T2 post-contrast · coronal · 3.0mm · 0.62mm/px · 2 of 56 slices shown]
[im 1/56]
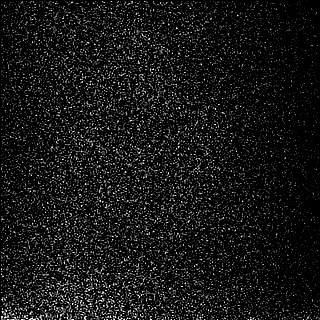
[im 56/56]
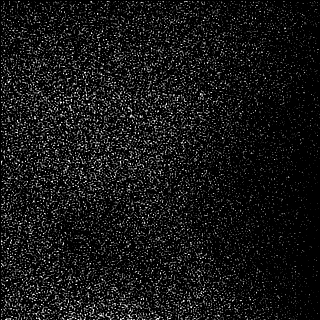

[Series 17: T1 post-contrast · axial · 1.0mm · 1.02mm/px · z∈[-105,+70]mm · 7 of 176 slices shown (1 of 3)]
[im 1/176]
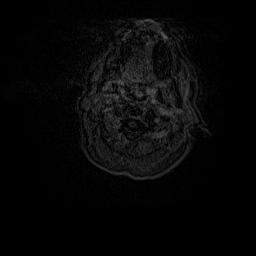
[im 30/176]
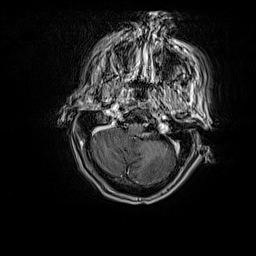
[im 59/176]
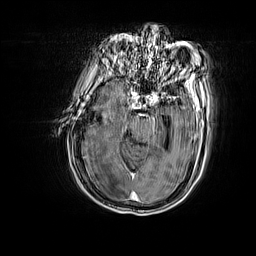
[im 88/176]
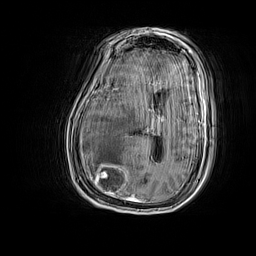
[im 117/176]
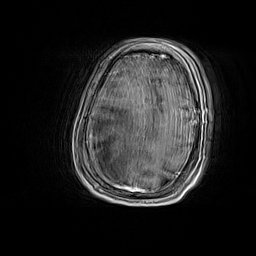
[im 146/176]
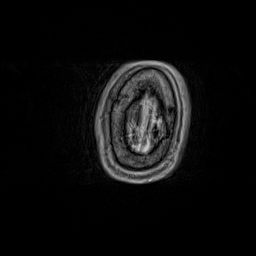
[im 176/176]
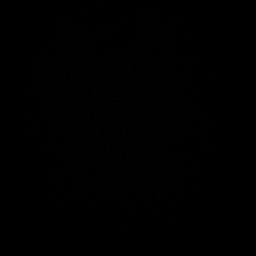

[Series 18: T1 post-contrast · coronal · 5.0mm · 0.57mm/px · 2 of 37 slices shown (2 of 3)]
[im 1/37]
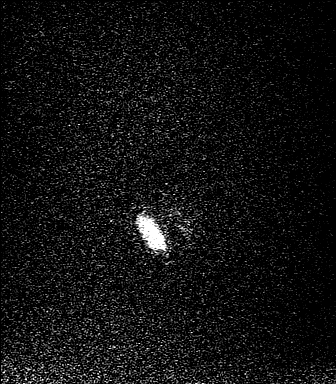
[im 37/37]
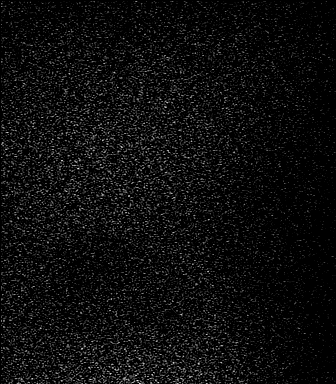

[Series 19: T1 post-contrast · sagittal · 3.0mm · 0.75mm/px · 2 of 50 slices shown (3 of 3)]
[im 1/50]
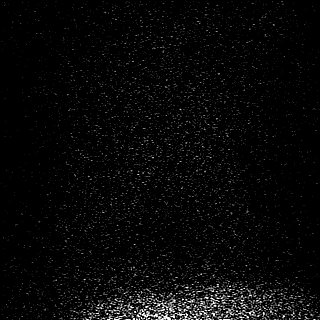
[im 50/50]
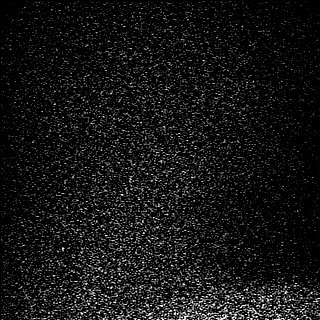

[48 of 48 positions shown; findings below may reference images not displayed]

FINDINGS: Brain: The right occipital lobe tumor demonstrates predominantly
peripheral enhancement. A solid nodule is present in the
inferolateral aspect of the tumor measuring 13 mm. The peripherally
enhancing component measures 31 x 35 x 32 mm.

The study is moderately degraded by patient motion. No other focal
lesions are present.

Extensive white matter edema is again noted with extension into
posterior limb of the internal capsule and the external capsule.
This results in significant mass effect with midline shift measuring
up to 11 mm. Sulci are effaced throughout the right hemisphere
greatest posteriorly.

Remote lacunar infarcts are present in the cerebellum bilaterally.
There is some mass effect on the brainstem.

No other focal areas of edema are present.

Vascular: Flow is present in the major intracranial arteries.

Skull and upper cervical spine: The craniocervical junction is
normal. Upper cervical spine is within normal limits. Marrow signal
is unremarkable.

Sinuses/Orbits: The paranasal sinuses and mastoid air cells are
clear. Bilateral lens replacements are noted. Globes and orbits are
otherwise unremarkable.
IMPRESSION: 1. Peripherally enhancing lesion in the right occipital lobe
measures 31 x 35 x 32 mm likely represents focal metastasis from the
patient's known lung cancer.
2. Extensive white matter edema and mass effect with midline shift
measuring up to 11 mm.
3. Remote lacunar infarcts of the cerebellum bilaterally.

## 2021-12-30 IMAGING — CT CT CERVICAL SPINE W/O CM
3 of 4 series · 12 of 35 positions shown, 14 images · non-contrast
Comparison: Head CT reported separately.

CLINICAL DATA: 71-year-old female with lung cancer. Altered mental
status and dizziness. Right hemisphere brain metastasis on CT today.



[Series 5: orthogonal bone · axial · 0.31mm/px · z∈[-214,-94]mm · 4 of 99 slices shown, 5 images]
[im 17/99  soft-tissue]
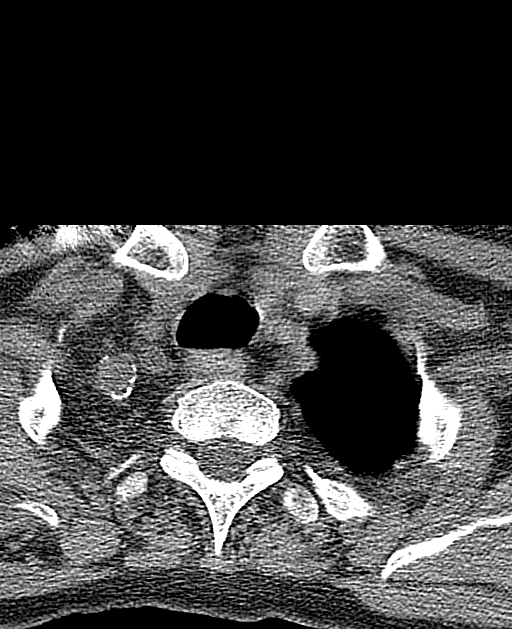
[im 17/99  bone]
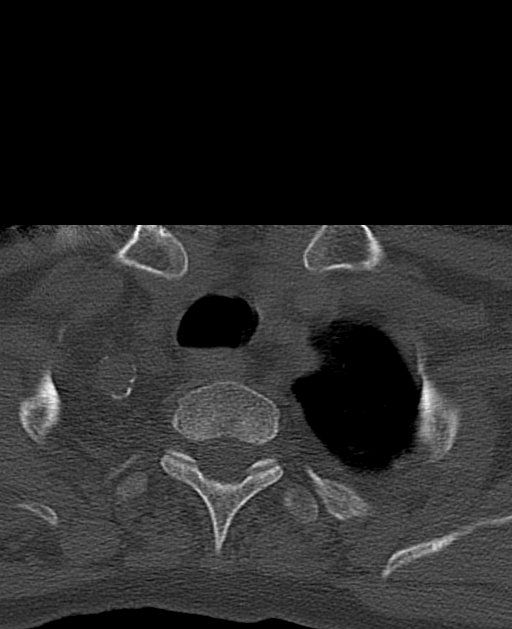
[im 33/99  bone]
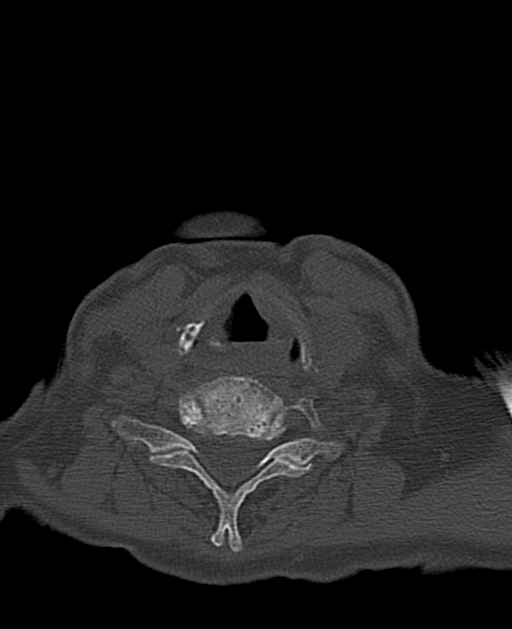
[im 66/99  bone]
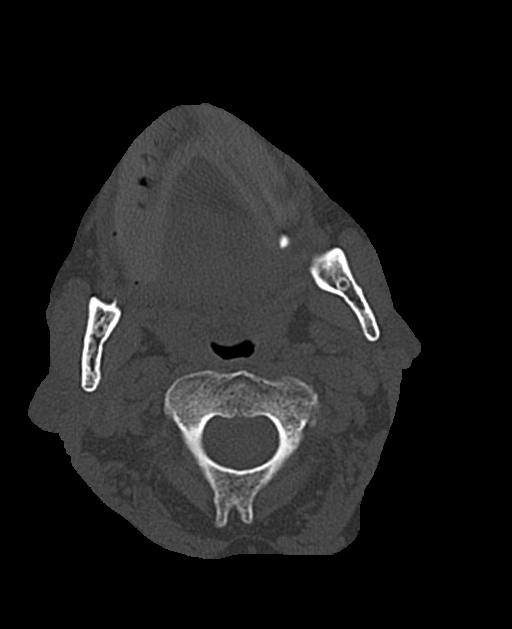
[im 82/99  bone]
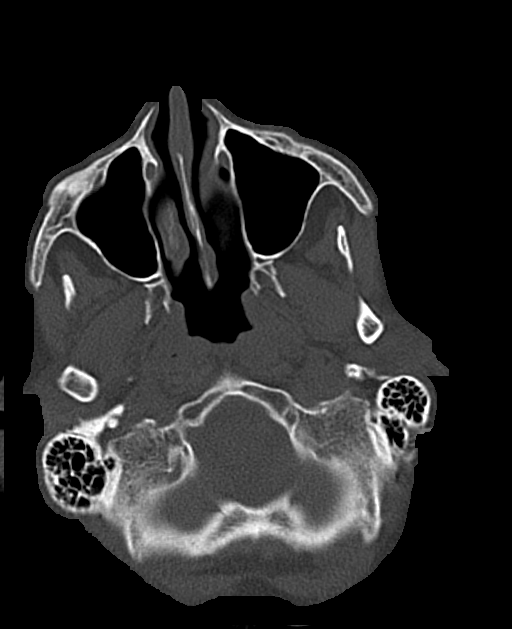

[Series 6: coronal bone · coronal · 0.44mm/px · 3 of 145 slices shown]
[im 29/145  bone]
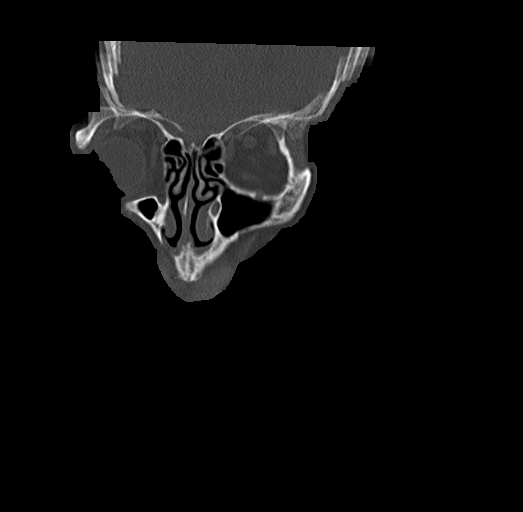
[im 58/145  bone]
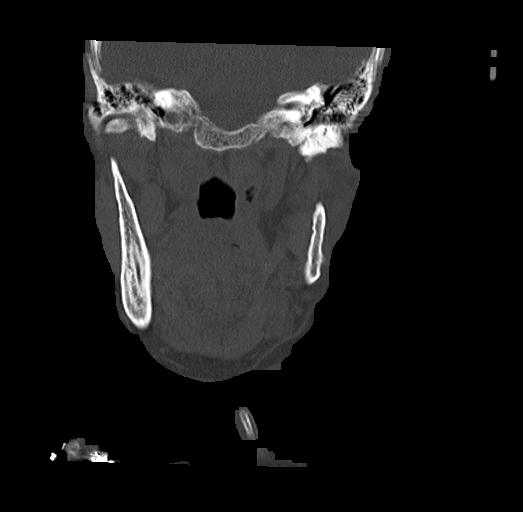
[im 87/145  bone]
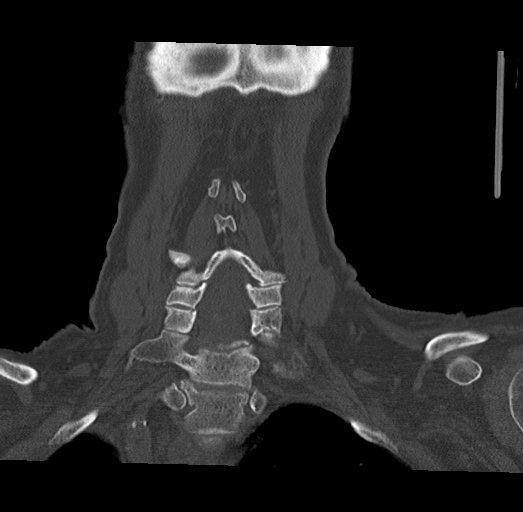

[Series 7: sagittal bone · sagittal · 0.40mm/px · 5 of 170 slices shown, 6 images]
[im 57/170  bone]
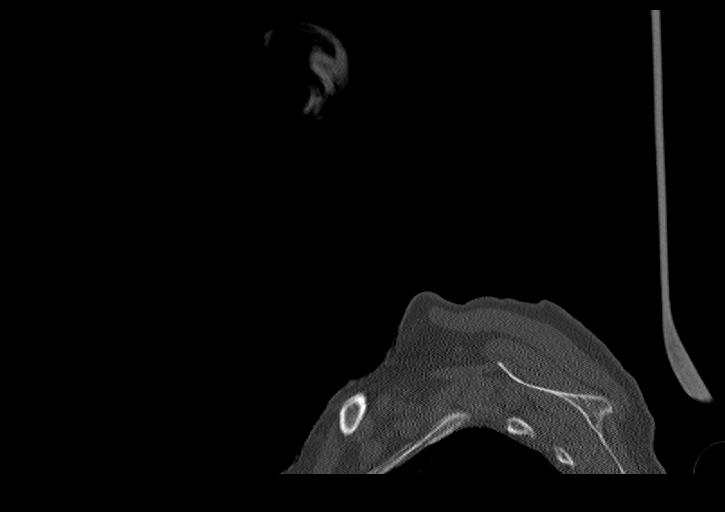
[im 71/170  bone]
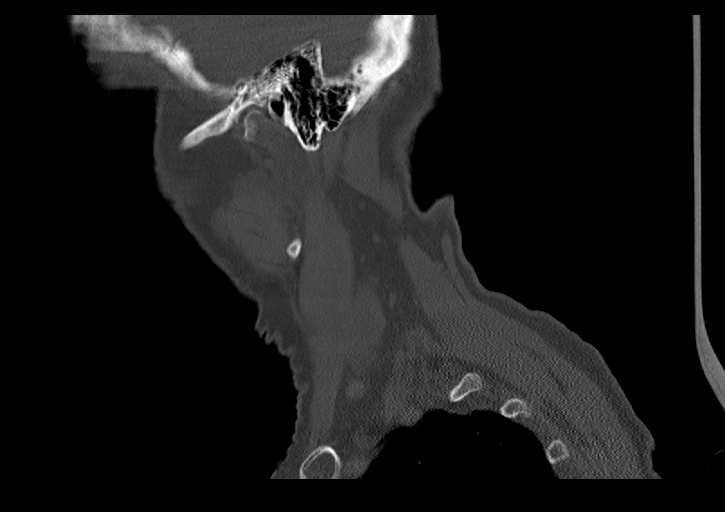
[im 85/170  soft-tissue]
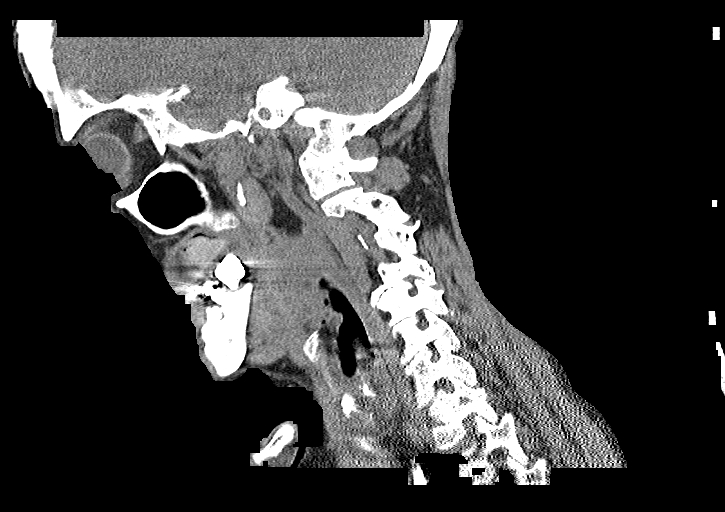
[im 85/170  bone]
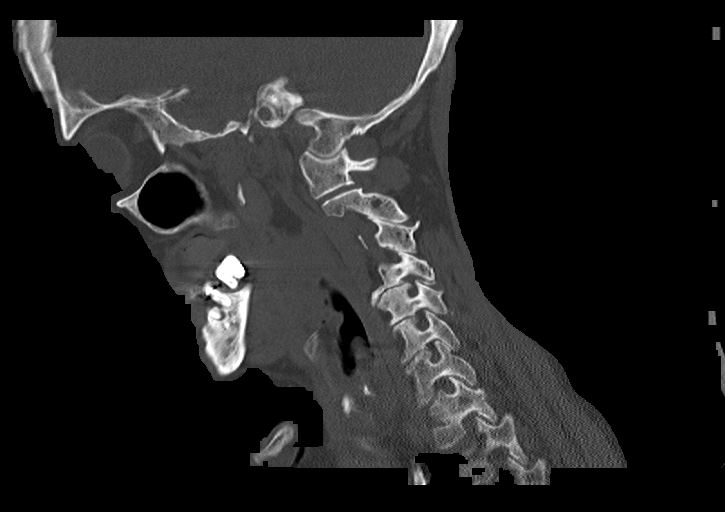
[im 99/170  bone]
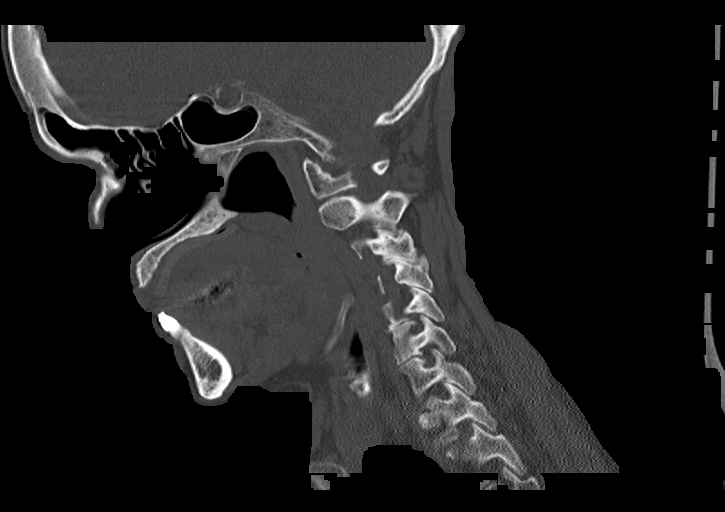
[im 113/170  bone]
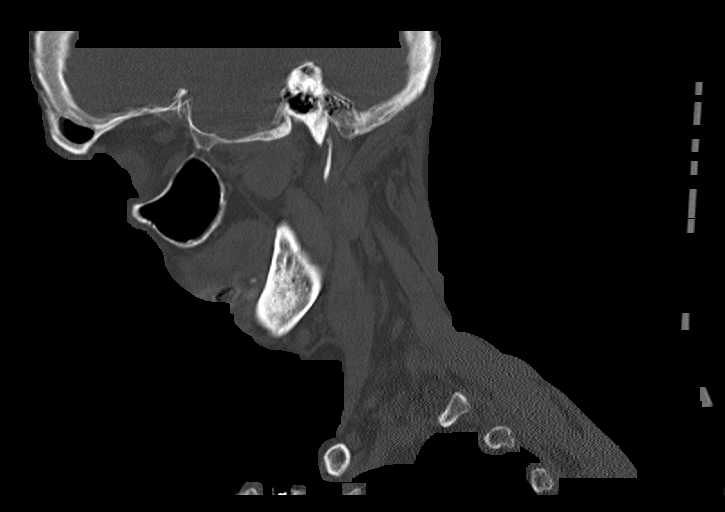

[12 of 35 positions shown; findings below may reference images not displayed]

FINDINGS: Alignment: Degenerative appearing straightening and mild reversal of
cervical lordosis. Cervicothoracic junction alignment is within
normal limits. Bilateral posterior element alignment is within
normal limits.

Skull base and vertebrae: Visualized skull base is intact. No
atlanto-occipital dissociation. C1 and C2 appear intact and aligned.
No acute or suspicious osseous lesion identified in the cervical
spine.

Soft tissues and spinal canal: No prevertebral fluid or swelling. No
visible canal hematoma. Bilateral cervical calcified carotid
atherosclerosis.

Disc levels: Advanced chronic disc and endplate degeneration C4-C5
and C5-C6. Widespread upper cervical facet arthropathy. No
significant cervical spinal stenosis suspected.

Upper chest: Mild architectural distortion in the right lung apex.

Other: Partially visible right hemisphere vasogenic edema, see Head
CT reported separately.
IMPRESSION: 1. Cervical spine degeneration with no acute osseous abnormality
identified.
2. Partially visible cerebral edema, see Head CT reported
separately.

## 2021-12-30 IMAGING — CT CT HEAD WO/W CM
3 of 6 series · 13 of 47 positions shown, 15 images · IV contrast (agent unspecified)
Comparison: Brain MRI [DATE].
COMPARISON: Brain MRI [DATE].

Addendum:
CLINICAL DATA: 71-year-old female with lung cancer. Altered mental
status and dizziness. Brain MRI in [ZH] was negative for metastatic
disease.

EXAM:
CT HEAD WITHOUT AND WITH CONTRAST
TECHNIQUE: Contiguous axial images were obtained from the base of the skull
through the vertex without and with intravenous contrast.

[Series 9: head w · axial · 0.47mm/px · z∈[-52,+84]mm · 8 of 33 slices shown, 10 images]
[im 3/33  brain]
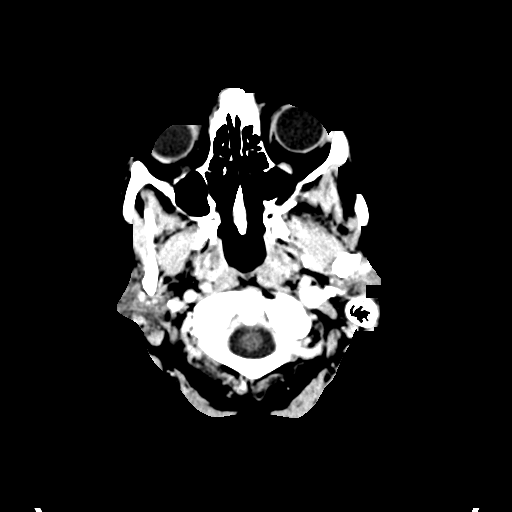
[im 3/33  bone]
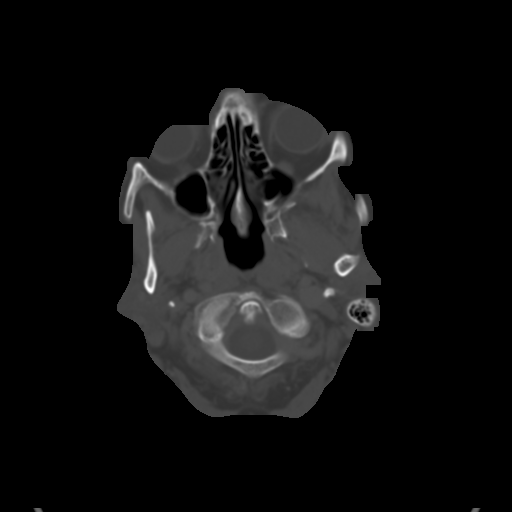
[im 7/33  brain]
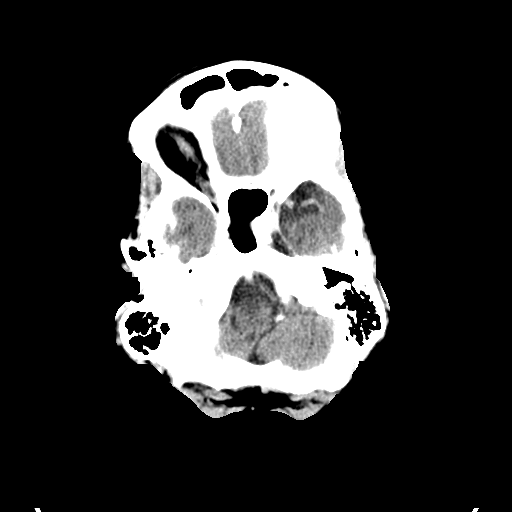
[im 11/33  brain]
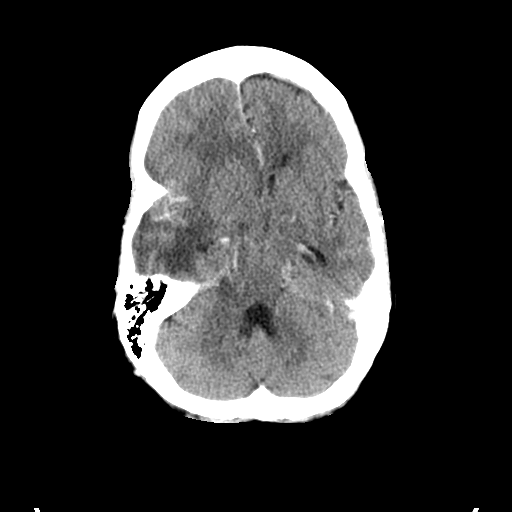
[im 15/33  brain]
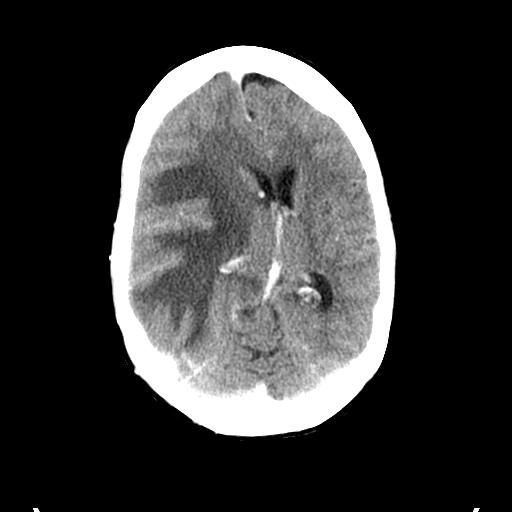
[im 18/33  brain]
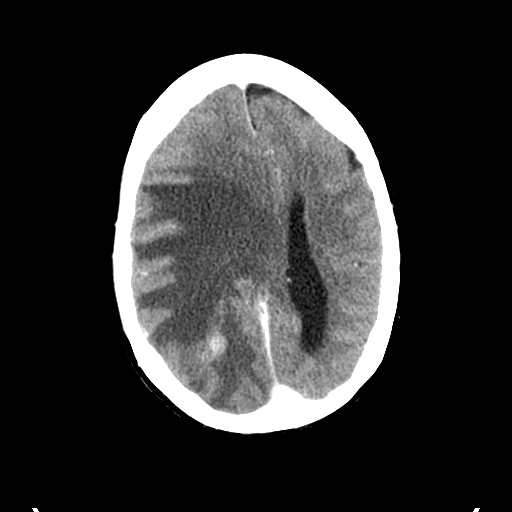
[im 18/33  bone]
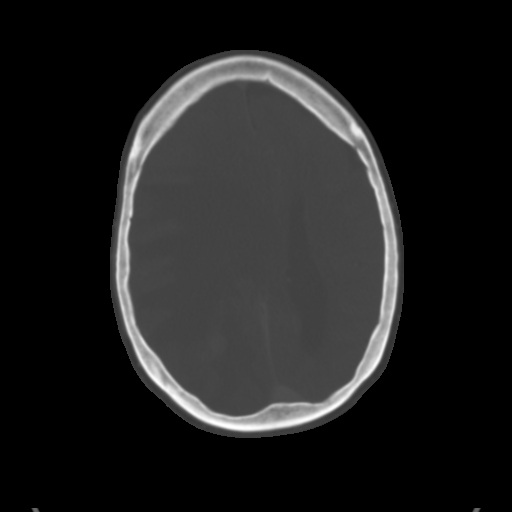
[im 22/33  brain]
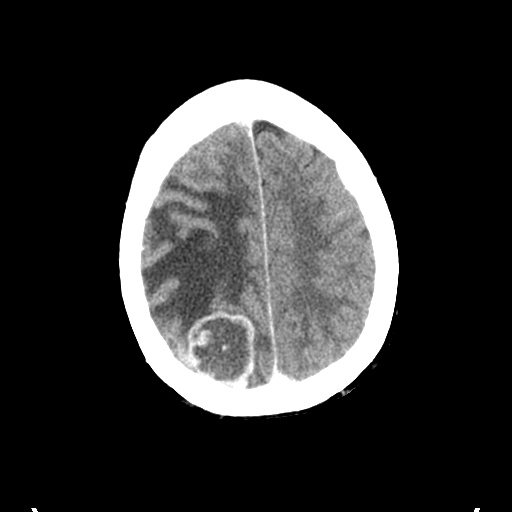
[im 26/33  brain]
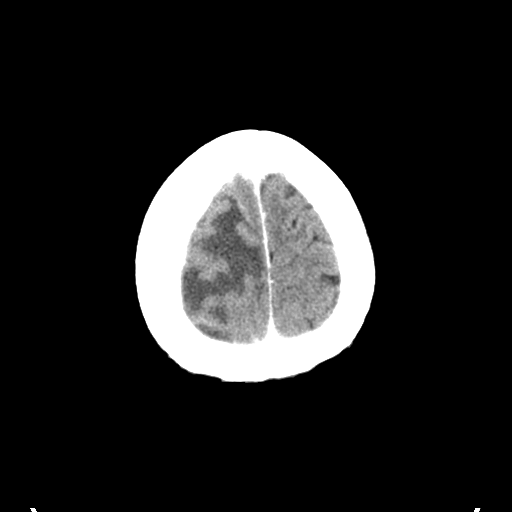
[im 30/33  brain]
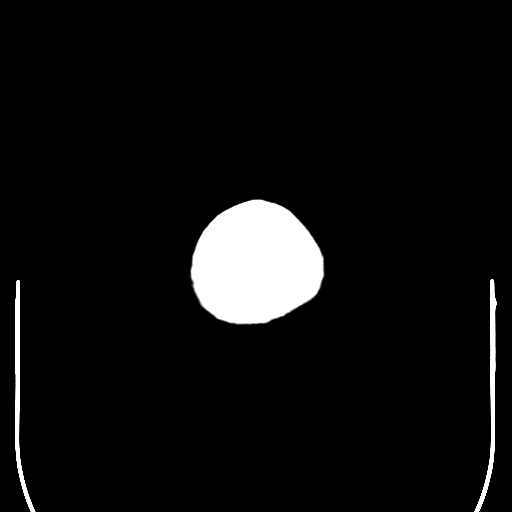

[Series 11: coronal soft tissue · coronal · 0.32mm/px · 3 of 63 slices shown]
[im 16/63  brain]
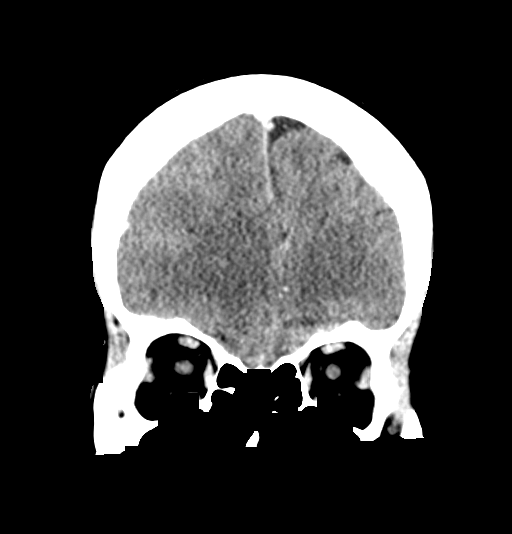
[im 32/63  brain]
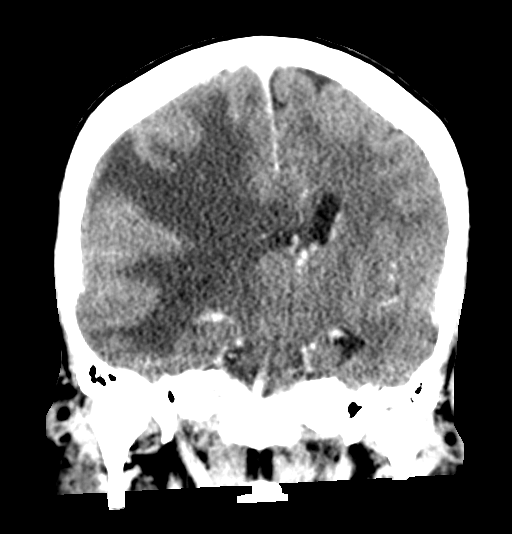
[im 47/63  brain]
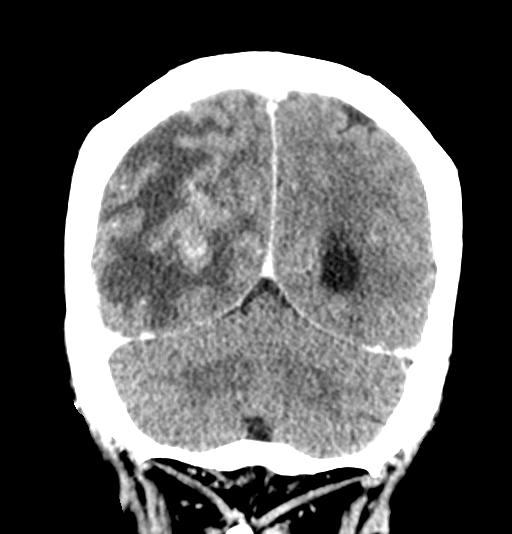

[Series 12: sagittal soft tissue · sagittal · 0.36mm/px · 2 of 55 slices shown]
[im 19/55  brain]
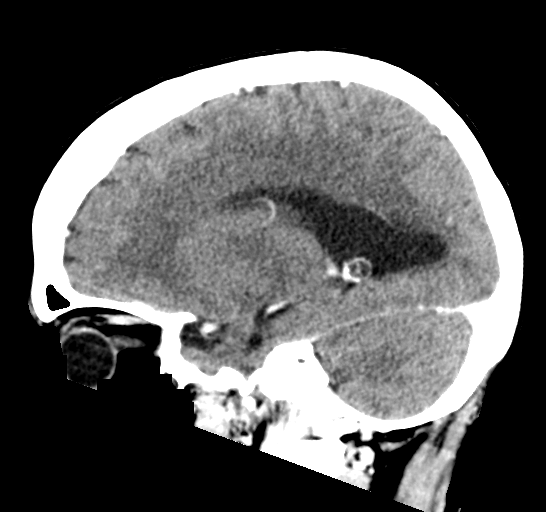
[im 37/55  brain]
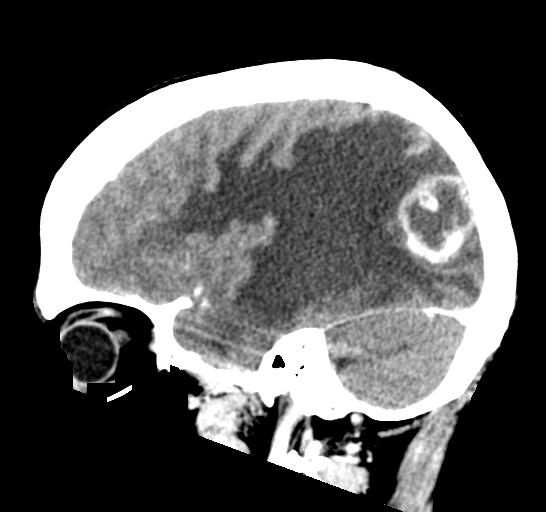

[13 of 47 positions shown; findings below may reference images not displayed]

RADIATION DOSE REDUCTION: This exam was performed according to the
departmental dose-optimization program which includes automated
exposure control, adjustment of the mA and/or kV according to
patient size and/or use of iterative reconstruction technique.

CONTRAST:  75mL OMNIPAQUE IOHEXOL 300 MG/ML  SOLN
FINDINGS: Brain: Lobulated, nodular and enhancing mass in the posterior right
hemisphere center at the right parieto-occipital sulcus is 34 by 40
x 33 mm (AP by transverse by CC) with a large volume of right
hemisphere vasogenic edema. Associated intracranial mass effect with
leftward midline shift of up to 13 mm (coronal image 28). Mass
effect on the right lateral ventricle and there is mild trapping of
the left lateral ventricle now with some transependymal edema on
that side suspected. The suprasellar cistern is effaced but other
basilar cisterns remain patent.

No superimposed acute intracranial hemorrhage, acute cortically
based infarct. Following contrast no other enhancing brain
metastasis is identified.

Vascular: Calcified atherosclerosis at the skull base. The major
intracranial vascular structures appear to be enhancing as expected.

Skull: No acute or suspicious osseous lesion identified.

Sinuses/Orbits: Visualized paranasal sinuses and mastoids are stable
and well aerated.

Other: Visualized orbits and scalp soft tissues are within normal
limits.
IMPRESSION: 1. Relatively large 4 cm enhancing intra-axial mass in the posterior
right hemisphere (parieto-occipital sulcus) with a large volume of
hemispheric vasogenic edema most compatible with brain metastasis in
this setting.

2. Intracranial mass effect with leftward midline shift of up to 13
mm, trapped left lateral ventricle, and effaced suprasellar cistern.
Mild associated transependymal edema.

3. No other brain metastasis identified by CT.

ADDENDUM:
Critical Value/emergent results were called by telephone at the time
of interpretation on [DATE] At [ZH] hours to Dr. GAYIPOVA ,
who verbally acknowledged these results.

*** End of Addendum ***
RADIATION DOSE REDUCTION: This exam was performed according to the
departmental dose-optimization program which includes automated
exposure control, adjustment of the mA and/or kV according to
patient size and/or use of iterative reconstruction technique.

CONTRAST:  75mL OMNIPAQUE IOHEXOL 300 MG/ML  SOLN
FINDINGS: Brain: Lobulated, nodular and enhancing mass in the posterior right
hemisphere center at the right parieto-occipital sulcus is 34 by 40
x 33 mm (AP by transverse by CC) with a large volume of right
hemisphere vasogenic edema. Associated intracranial mass effect with
leftward midline shift of up to 13 mm (coronal image 28). Mass
effect on the right lateral ventricle and there is mild trapping of
the left lateral ventricle now with some transependymal edema on
that side suspected. The suprasellar cistern is effaced but other
basilar cisterns remain patent.

No superimposed acute intracranial hemorrhage, acute cortically
based infarct. Following contrast no other enhancing brain
metastasis is identified.

Vascular: Calcified atherosclerosis at the skull base. The major
intracranial vascular structures appear to be enhancing as expected.

Skull: No acute or suspicious osseous lesion identified.

Sinuses/Orbits: Visualized paranasal sinuses and mastoids are stable
and well aerated.

Other: Visualized orbits and scalp soft tissues are within normal
limits.
IMPRESSION: 1. Relatively large 4 cm enhancing intra-axial mass in the posterior
right hemisphere (parieto-occipital sulcus) with a large volume of
hemispheric vasogenic edema most compatible with brain metastasis in
this setting.

2. Intracranial mass effect with leftward midline shift of up to 13
mm, trapped left lateral ventricle, and effaced suprasellar cistern.
Mild associated transependymal edema.

3. No other brain metastasis identified by CT.

## 2021-12-30 MED ORDER — DEXAMETHASONE SODIUM PHOSPHATE 10 MG/ML IJ SOLN
10.0000 mg | Freq: Once | INTRAMUSCULAR | Status: AC
  Administered 2021-12-30: 10 mg via INTRAVENOUS
  Filled 2021-12-30: qty 1

## 2021-12-30 MED ORDER — OXYCODONE HCL 5 MG PO TABS: 5.0000 mg | ORAL_TABLET | ORAL | Status: DC | PRN

## 2021-12-30 MED ORDER — SODIUM CHLORIDE 0.9 % IV BOLUS
1000.0000 mL | Freq: Once | INTRAVENOUS | Status: AC
  Administered 2021-12-30: 1000 mL via INTRAVENOUS

## 2021-12-30 MED ORDER — LORAZEPAM 2 MG/ML IJ SOLN: 1.0000 mg | Freq: Four times a day (QID) | INTRAMUSCULAR | Status: DC | PRN

## 2021-12-30 MED ORDER — DEXAMETHASONE SODIUM PHOSPHATE 4 MG/ML IJ SOLN
4.0000 mg | Freq: Four times a day (QID) | INTRAMUSCULAR | Status: DC
  Administered 2021-12-30 – 2022-01-03 (×16): 4 mg via INTRAVENOUS
  Filled 2021-12-30 (×16): qty 1

## 2021-12-30 MED ORDER — CALCIUM GLUCONATE-NACL 2-0.675 GM/100ML-% IV SOLN
2.0000 g | Freq: Once | INTRAVENOUS | Status: AC
  Administered 2021-12-30: 2000 mg via INTRAVENOUS
  Filled 2021-12-30: qty 100

## 2021-12-30 MED ORDER — IOHEXOL 300 MG/ML  SOLN
75.0000 mL | Freq: Once | INTRAMUSCULAR | Status: AC | PRN
  Administered 2021-12-30: 75 mL via INTRAVENOUS

## 2021-12-30 MED ORDER — MORPHINE SULFATE (PF) 2 MG/ML IV SOLN
2.0000 mg | INTRAVENOUS | Status: DC | PRN
  Filled 2021-12-30: qty 1

## 2021-12-30 MED ORDER — LORAZEPAM 2 MG/ML IJ SOLN
1.0000 mg | Freq: Once | INTRAMUSCULAR | Status: AC | PRN
  Administered 2021-12-31: 1 mg via INTRAVENOUS
  Filled 2021-12-30: qty 1

## 2021-12-30 MED ORDER — GADOBUTROL 1 MMOL/ML IV SOLN
5.0000 mL | Freq: Once | INTRAVENOUS | Status: AC | PRN
  Administered 2021-12-30: 5 mL via INTRAVENOUS

## 2021-12-30 MED ORDER — SODIUM CHLORIDE (PF) 0.9 % IJ SOLN
INTRAMUSCULAR | Status: AC
  Administered 2021-12-30: 10 mL
  Filled 2021-12-30: qty 50

## 2021-12-30 MED ORDER — PROCHLORPERAZINE EDISYLATE 10 MG/2ML IJ SOLN: 10.0000 mg | Freq: Four times a day (QID) | INTRAMUSCULAR | Status: DC | PRN

## 2021-12-30 NOTE — ED Provider Notes (Signed)
?[facility] DEPT ?Provider Note ? ? ?CSN: [id number] ?Arrival date & time: 12/30/21  0857 ? ?  ? ?History ? ?No chief complaint on file. ? ? ?[name] is a 71 y.o. female. ? ?Pt is a 71 yo female with a pmhx significant for non-small cell lung cancer (adenocarcinoma), copd, and hypothyroidism.  She presents to the ED today with frequent falls and feeling dizzy and weak.  Pt is currently getting every 3 week immunotherapy with Tecentriq.  She missed the last cycle due to Covid + status on 3/20.  She was also supposed to go back on 4/10 for another infusion and her husband canceled that as well.  Last infusion was on 3/1.  Pt said she tried to get up out of bed today and felt very dizzy and weak.  She was unable to get up and slid off the bed.  Husband unable to get her up and she was on the ground when EMS arrived.  Pt denies any new pain.  No trouble speaking or swallowing or seeing. ? ? ? ?  ? ?Home Medications ?Prior to Admission medications   ?Medication Sig Start Date End Date Taking? Authorizing Provider  ?Acetaminophen (TYLENOL ARTHRITIS EXT RELIEF PO) Take 1 capsule by mouth daily as needed.    [provider]  ?albuterol (PROVENTIL HFA;VENTOLIN HFA) 108 (90 Base) MCG/ACT inhaler Inhale 2 puffs into the lungs every 4 (four) hours as needed for wheezing or shortness of breath (cough, shortness of breath or wheezing.). 10/29/17   [name], PA  ?alendronate (FOSAMAX) 70 MG tablet Take 70 mg by mouth once a week. 06/15/21   [provider]  ?Calcium Carbonate (CALCIUM 600 PO) Take 600 mg by mouth 2 (two) times daily.     [provider]  ?Cholecalciferol (VITAMIN D3 PO) Take 2,000 Units by mouth daily.    [provider]  ?fluticasone (FLONASE) 50 MCG/ACT nasal spray Place 2 sprays into both nostrils daily. 01/06/18   [name], [place]  ?Fluticasone-Salmeterol (ADVAIR) 250-50 MCG/DOSE AEPB Inhale 1 puff into the lungs 2 (two) times  daily.  02/05/18   [provider]  ?guaiFENesin (MUCINEX) 600 MG 12 hr tablet Take 1 tablet (600 mg total) by mouth 2 (two) times daily as needed for cough or to loosen phlegm. ?Patient not taking: Reported on 07/30/2021 10/28/20   Nani Skillern, PA-C  ?HYDROcodone-acetaminophen (NORCO/VICODIN) 5-325 MG tablet Take 1-2 tablets by mouth every 6 (six) hours as needed. 01/23/21   [provider]  ?levothyroxine (SYNTHROID) 75 MCG tablet TAKE 1 TABLET BY MOUTH EVERY DAY BEFORE BREAKFAST 12/10/21   Curt Bears, MD  ?Multiple Vitamin (MULTIVITAMIN) tablet Take 1 tablet by mouth daily.    [provider]  ?   ? ?Allergies    ?Patient has no known allergies.   ? ?Review of Systems   ?Review of Systems  ?Neurological:  Positive for dizziness and weakness.  ?All other systems reviewed and are negative. ? ?Physical Exam ?Updated Vital Signs ?BP 124/66   Pulse 64   Resp 20   SpO2 98%  ?Physical Exam ?Vitals and nursing note reviewed.  ?Constitutional:   ?   Appearance: Normal appearance.  ?HENT:  ?   Head: Normocephalic and atraumatic.  ?   Right Ear: External ear normal.  ?   Left Ear: External ear normal.  ?   Nose: Nose normal.  ?   Mouth/Throat:  ?   Mouth: Mucous membranes are  dry.  ?Eyes:  ?   Extraocular Movements: Extraocular movements intact.  ?   Conjunctiva/sclera: Conjunctivae normal.  ?   Pupils: Pupils are equal, round, and reactive to light.  ?Cardiovascular:  ?   Rate and Rhythm: Normal rate and regular rhythm.  ?   Pulses: Normal pulses.  ?   Heart sounds: Normal heart sounds.  ?Pulmonary:  ?   Effort: Pulmonary effort is normal.  ?   Breath sounds: Normal breath sounds.  ?Abdominal:  ?   General: Abdomen is flat. Bowel sounds are normal.  ?   Palpations: Abdomen is soft.  ?Musculoskeletal:     ?   General: Normal range of motion.  ?   Cervical back: Normal range of motion and neck supple.  ?Skin: ?   General: Skin is warm.  ?   Capillary Refill: Capillary refill takes  less than 2 seconds.  ?Neurological:  ?   General: No focal deficit present.  ?   Mental Status: She is alert and oriented to person, place, and time.  ?Psychiatric:     ?   Mood and Affect: Mood normal.     ?   Behavior: Behavior normal.     ?   Thought Content: Thought content normal.     ?   Judgment: Judgment normal.  ? ? ?ED Results / Procedures / Treatments   ?Labs ?(all labs ordered are listed, but only abnormal results are displayed) ?Labs Reviewed  ?CBC WITH DIFFERENTIAL/PLATELET - Abnormal; Notable for the following components:  ?    Result Value  ? RBC 3.85 (*)   ? All other components within normal limits  ?COMPREHENSIVE METABOLIC PANEL - Abnormal; Notable for the following components:  ? Creatinine, Ser 0.43 (*)   ? Calcium 7.8 (*)   ? Total Protein 5.9 (*)   ? Anion gap 3 (*)   ? All other components within normal limits  ?TSH  ?URINALYSIS, ROUTINE W REFLEX MICROSCOPIC  ?CBG MONITORING, ED  ? ? ?EKG ?None ? ?Radiology ?DG Chest 2 View ? ?Result Date: 12/30/2021 ?CLINICAL DATA:  71 year old female with lung cancer. Altered mental status and dizziness. 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 Fall. EXAM: CHEST - 2 VIEW COMPARISON:  Chest CT 08/16/2021 and earlier. FINDINGS: Semi upright AP and lateral views of the chest at 1001 hours. Chronic postoperative volume reduction in the right lung appears stable. Stable cardiac size and mediastinal contours. Visualized tracheal air column is within normal limits. No pneumothorax, pulmonary edema, pleural effusion or acute pulmonary opacity identified. No acute osseous abnormality identified. Negative visible bowel gas. IMPRESSION: Post treatment changes to the right lung. No acute cardiopulmonary abnormality or acute traumatic injury identified. Electronically Signed   By: Genevie Ann M.D.   On: 12/30/2021 10:17  ? ?CT HEAD W & WO CONTRAST (5MM) ? ?Addendum Date: 12/30/2021   ?ADDENDUM REPORT: 12/30/2021 10:18 ADDENDUM: Critical Value/emergent results were  called by telephone at the time of interpretation on 12/30/2021 At 1015 hours to Dr. Isla Pence , who verbally acknowledged these results. Electronically Signed   By: Genevie Ann M.D.   On: 12/30/2021 10:18  ? ?Result Date: 12/30/2021 ?CLINICAL DATA:  71 year old female with lung cancer. Altered mental status and dizziness. Brain MRI in 2021 was negative for metastatic disease. EXAM: CT HEAD WITHOUT AND WITH CONTRAST TECHNIQUE: Contiguous axial images were obtained from the base of the skull through the vertex without and with intravenous contrast. RADIATION DOSE REDUCTION: This exam was  performed according to the departmental dose-optimization program which includes automated exposure control, adjustment of the mA and/or kV according to patient size and/or use of iterative reconstruction technique. CONTRAST:  76mL OMNIPAQUE IOHEXOL 300 MG/ML  SOLN COMPARISON:  Brain MRI 05/18/2020. FINDINGS: Brain: Lobulated, nodular and enhancing mass in the posterior right hemisphere center at the right parieto-occipital sulcus is 34 by 40 x 33 mm (AP by transverse by CC) with a large volume of right hemisphere vasogenic edema. Associated intracranial mass effect with leftward midline shift of up to 13 mm (coronal image 28). Mass effect on the right lateral ventricle and there is mild trapping of the left lateral ventricle now with some transependymal edema on that side suspected. The suprasellar cistern is effaced but other basilar cisterns remain patent. No superimposed acute intracranial hemorrhage, acute cortically based infarct. Following contrast no other enhancing brain metastasis is identified. Vascular: Calcified atherosclerosis at the skull base. The major intracranial vascular structures appear to be enhancing as expected. Skull: No acute or suspicious osseous lesion identified. Sinuses/Orbits: Visualized paranasal sinuses and mastoids are stable and well aerated. Other: Visualized orbits and scalp soft tissues are  within normal limits. IMPRESSION: 1. Relatively large 4 cm enhancing intra-axial mass in the posterior right hemisphere (parieto-occipital sulcus) with a large volume of hemispheric vasogenic edema most compatible with

## 2021-12-30 NOTE — H&P (Signed)
?History and Physical  ? ? ?Patient: [name] DXA:[id number] DOB: February 28, 1951 ?DOA: 12/30/2021 ?DOS: the patient was seen and examined on 12/30/2021 ?PCP: [name], PA  ?Patient coming from: Home ? ?Chief Complaint: "I've been having problems with standing, walking, and sitting." ? ?HPI: [name] is a 71 y.o. female with medical history significant of lung cancer, hypothyroidism, depression, COPD. Presenting with weakness and fall. She is unsure of how long she has felt generally weak. She reports that she has felt dizzy and off balance. She has also been dealing with severe headaches that have not been responsive to OTC meds. This morning, she was trying to get out of bed. She felt very dizzy and ended up rolling off the bed to the floor. She did not hit her head. She did not pass out. She was unable to get up on her own. She called for her husband. He was unable to lift her, so they called EMS. She denies any other aggravating or alleviating factors.   ? ?Review of Systems: As mentioned in the history of present illness. All other systems reviewed and are negative. ?Past Medical History:  ?Diagnosis Date  ? Cataract   ? COPD (chronic obstructive pulmonary disease) ([place])   ? ?Past Surgical History:  ?Procedure Laterality Date  ? APPENDECTOMY  2008  ? COLON SURGERY  2008  ? colostomy-infection post op appendectomy  ? COLOSTOMY REVERSAL  2009  ? INTERCOSTAL NERVE BLOCK Right 10/19/2020  ? Procedure: INTERCOSTAL NERVE BLOCK;  Surgeon: [name], MD;  Location: [facility];  Service: Thoracic;  Laterality: Right;  ? NODE DISSECTION Right 10/19/2020  ? Procedure: NODE DISSECTION;  Surgeon: [name], MD;  Location: [facility];  Service: Thoracic;  Laterality: Right;  ? VIDEO BRONCHOSCOPY WITH ENDOBRONCHIAL NAVIGATION N/A 05/08/2020  ? Procedure: VIDEO BRONCHOSCOPY WITH ENDOBRONCHIAL NAVIGATION;  Surgeon: [name], MD;  Location: [facility];  Service: Thoracic;  Laterality: N/A;  ? VIDEO  BRONCHOSCOPY WITH ENDOBRONCHIAL ULTRASOUND N/A 05/08/2020  ? Procedure: VIDEO BRONCHOSCOPY WITH ENDOBRONCHIAL ULTRASOUND;  Surgeon: [name], MD;  Location: [facility];  Service: Thoracic;  Laterality: N/A;  ? ?Social History:  reports that she quit smoking about 2 years ago. Her smoking use included cigarettes. She has a 2.50 pack-year smoking history. She has never used smokeless tobacco. She reports that she does not currently use alcohol after a past usage of about 10.0 standard drinks per week. She reports that she does not use drugs. ? ?No Known Allergies ? ?Family History  ?Problem Relation Age of Onset  ? Mental illness Mother   ? Colon cancer Mother 70  ? Cancer Father   ? Colon polyps Neg Hx   ? Esophageal cancer Neg Hx   ? Stomach cancer Neg Hx   ? Rectal cancer Neg Hx   ? ? ?Prior to Admission medications   ?Medication Sig Start Date End Date Taking? Authorizing Provider  ?Acetaminophen (TYLENOL ARTHRITIS EXT RELIEF PO) Take 1 capsule by mouth daily as needed.    [provider]  ?albuterol (PROVENTIL HFA;VENTOLIN HFA) 108 (90 Base) MCG/ACT inhaler Inhale 2 puffs into the lungs every 4 (four) hours as needed for wheezing or shortness of breath (cough, shortness of breath or wheezing.). 10/29/17   [name], PA  ?alendronate (FOSAMAX) 70 MG tablet Take 70 mg by mouth once a week. 06/15/21   [provider]  ?Calcium Carbonate (CALCIUM 600 PO) Take 600 mg by mouth 2 (two) times  daily.     [provider]  ?Cholecalciferol (VITAMIN D3 PO) Take 2,000 Units by mouth daily.    [provider]  ?fluticasone (FLONASE) 50 MCG/ACT nasal spray Place 2 sprays into both nostrils daily. 01/06/18   [name], [place]  ?Fluticasone-Salmeterol (ADVAIR) 250-50 MCG/DOSE AEPB Inhale 1 puff into the lungs 2 (two) times daily.  02/05/18   [provider]  ?guaiFENesin (MUCINEX) 600 MG 12 hr tablet Take 1 tablet (600 mg total) by mouth 2 (two) times daily as needed for  cough or to loosen phlegm. ?Patient not taking: Reported on 07/30/2021 10/28/20   Nani Skillern, PA-C  ?HYDROcodone-acetaminophen (NORCO/VICODIN) 5-325 MG tablet Take 1-2 tablets by mouth every 6 (six) hours as needed. 01/23/21   [provider]  ?levothyroxine (SYNTHROID) 75 MCG tablet TAKE 1 TABLET BY MOUTH EVERY DAY BEFORE BREAKFAST 12/10/21   Curt Bears, MD  ?Multiple Vitamin (MULTIVITAMIN) tablet Take 1 tablet by mouth daily.    [provider]  ? ? ?Physical Exam: ?Vitals:  ? 12/30/21 1100 12/30/21 1115 12/30/21 1130 12/30/21 1145  ?BP:  (!) 95/54 (!) 115/56 124/66  ?Pulse: 60 64 (!) 58 64  ?Resp: _0 ?SpO2: 99% 98% 96% 98%  ? ?General: 71 y.o. female resting in bed in NAD ?Eyes: PERRL, normal sclera ?ENMT: Nares patent w/o discharge, orophaynx clear, dentition normal, ears w/o discharge/lesions/ulcers ?Neck: Supple, trachea midline ?Cardiovascular: RRR, +S1, S2, no m/g/r, equal pulses throughout ?Respiratory: CTABL, no w/r/r, normal WOB ?GI: BS+, NDNT, no masses noted, no organomegaly noted ?MSK: No e/c/c ?Neuro: A&O x 3, no focal deficits ?Psyc: Appropriate interaction and affect, calm/cooperative ? ?Data Reviewed: ? ?Ca 2+  7.8 ?Albumin 3.5 ?WBC  6.1 ? ?CTH: ?1. Relatively large 4 cm enhancing intra-axial mass in the posterior right hemisphere (parieto-occipital sulcus) with a large volume of hemispheric vasogenic edema most compatible with brain metastasis in this setting. ?2. Intracranial mass effect with leftward midline shift of up to 13 ?mm, trapped left lateral ventricle, and effaced suprasellar cistern. ?Mild associated transependymal edema. ?3. No other brain metastasis identified by CT. ? ?Assessment and Plan: ?No notes have been filed under this hospital service. ?Service: Hospitalist ?NSCLC w/ brain mets ?Brain met w/ mass effect ?Falls ?    - admit to inpt, progressive ?    - neurosurgery consulted ?    - EDP spoke with neuro onc; decadron started ?    -  primary onc added to team ?    - [place] as above ?    - MRI ordered ?    - continue steroids, neuro checks ?    - seizure precautions ? ?Hypothyroidism ?    - continue home regimen when confirmed ? ?Hypocalcemia ?    - Ca2+ corrects to 8.2; replace ?    - check Mg2+ ? ?Depression ?    - continue home regimen when confirmed ? ?COPD ?    - not in exacerbation ?    - continue home regimen when confirmed ? ?Advance Care Planning:   Code Status: FULL ? ?Consults: Neurosurgery, Oncology ? ?Family Communication: None at bedside ? ?Severity of Illness: ?The appropriate patient status for this patient is INPATIENT. Inpatient status is judged to be reasonable and necessary in order to provide the required intensity of service to ensure the patient's safety. The patient's presenting symptoms, physical exam findings, and initial radiographic and laboratory data in the context of their chronic comorbidities is felt to  place them at high risk for further clinical deterioration. Furthermore, it is not anticipated that the patient will be medically stable for discharge from the hospital within 2 midnights of admission.  ? ?* I certify that at the point of admission it is my clinical judgment that the patient will require inpatient hospital care spanning beyond 2 midnights from the point of admission due to high intensity of service, high risk for further deterioration and high frequency of surveillance required.* ? ?Author: ?Jonnie Finner, DO ?12/30/2021 12:02 PM ? ?For on call review [email or web address].  ?

## 2021-12-30 NOTE — Plan of Care (Signed)

## 2021-12-31 ENCOUNTER — Ambulatory Visit: Payer: Medicare Other | Admitting: Physician Assistant

## 2021-12-31 ENCOUNTER — Ambulatory Visit: Payer: Medicare Other

## 2021-12-31 ENCOUNTER — Other Ambulatory Visit: Payer: Medicare Other

## 2021-12-31 ENCOUNTER — Encounter (HOSPITAL_COMMUNITY): Payer: Self-pay | Admitting: Internal Medicine

## 2021-12-31 ENCOUNTER — Other Ambulatory Visit: Payer: Self-pay | Admitting: Radiation Therapy

## 2021-12-31 ENCOUNTER — Inpatient Hospital Stay (HOSPITAL_COMMUNITY): Payer: Medicare Other

## 2021-12-31 ENCOUNTER — Other Ambulatory Visit: Payer: Self-pay | Admitting: Neurological Surgery

## 2021-12-31 ENCOUNTER — Ambulatory Visit: Admit: 2021-12-31 | Payer: Medicare Other | Admitting: Radiation Oncology

## 2021-12-31 ENCOUNTER — Ambulatory Visit
Admission: RE | Admit: 2021-12-31 | Discharge: 2021-12-31 | Disposition: A | Discharge status: Home / Self Care | Payer: Medicare Other | Source: Ambulatory Visit | Attending: Radiation Oncology | Admitting: Radiation Oncology

## 2021-12-31 DIAGNOSIS — C7931 Secondary malignant neoplasm of brain: Secondary | ICD-10-CM

## 2021-12-31 DIAGNOSIS — C3490 Malignant neoplasm of unspecified part of unspecified bronchus or lung: Principal | ICD-10-CM

## 2021-12-31 DIAGNOSIS — C3411 Malignant neoplasm of upper lobe, right bronchus or lung: Secondary | ICD-10-CM

## 2021-12-31 LAB — COMPREHENSIVE METABOLIC PANEL
ALT: 18 U/L (ref 0–44)
AST: 19 U/L (ref 15–41)
Albumin: 4.2 g/dL (ref 3.5–5.0)
Alkaline Phosphatase: 53 U/L (ref 38–126)
Anion gap: 8 (ref 5–15)
BUN: 12 mg/dL (ref 8–23)
CO2: 24 mmol/L (ref 22–32)
Calcium: 9.3 mg/dL (ref 8.9–10.3)
Chloride: 108 mmol/L (ref 98–111)
Creatinine, Ser: 0.43 mg/dL — ABNORMAL LOW (ref 0.44–1.00)
GFR, Estimated: >60 mL/min (ref >60)
Glucose, Bld: 117 mg/dL — ABNORMAL HIGH (ref 70–99)
Potassium: 3.8 mmol/L (ref 3.5–5.1)
Sodium: 140 mmol/L (ref 135–145)
Total Bilirubin: 0.7 mg/dL (ref 0.3–1.2)
Total Protein: 7.5 g/dL (ref 6.5–8.1)

## 2021-12-31 LAB — CBC
HCT: 39.5 % (ref 36.0–46.0)
Hemoglobin: 13.4 g/dL (ref 12.0–15.0)
MCH: 32 pg (ref 26.0–34.0)
MCHC: 33.9 g/dL (ref 30.0–36.0)
MCV: 94.3 fL (ref 80.0–100.0)
Platelets: 226 10*3/uL (ref 150–400)
RBC: 4.19 MIL/uL (ref 3.87–5.11)
RDW: 11.7 % (ref 11.5–15.5)
WBC: 10.9 10*3/uL — ABNORMAL HIGH (ref 4.0–10.5)
nRBC: 0 % (ref 0.0–0.2)

## 2021-12-31 IMAGING — MR MR HEAD WO/W CM
11 series · 48 of 48 positions shown · IV contrast (gadavist)
Comparison: [DATE]

CLINICAL DATA: Brain metastases, assess treatment response SRS
Protocol for Pre-Op Radiation Treatment planning

EXAM:
MRI HEAD WITHOUT AND WITH CONTRAST
TECHNIQUE: Multiplanar, multiecho pulse sequences of the brain and surrounding
structures were obtained without and with intravenous contrast.
CONTRAST:  6mL GADAVIST GADOBUTROL 1 MMOL/ML IV SOLN

[Series 5: T1 · sagittal · 3.0mm · 0.75mm/px · 2 of 31 slices shown (1 of 2)]
[im 1/31]
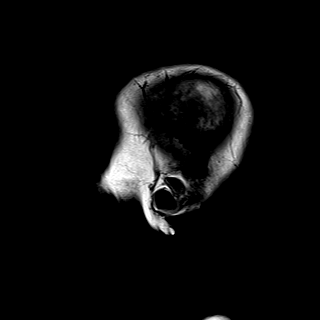
[im 31/31]
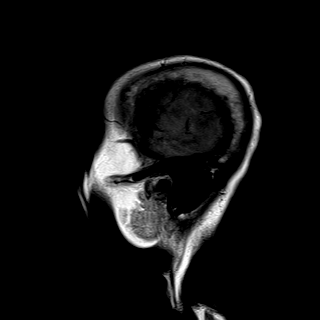

[Series 6: DWI · axial · 3.0mm · 2.09mm/px · z∈[-19,+109]mm · 6 of 88 slices shown (1 of 2)]
[im 1/88]
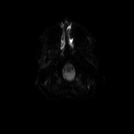
[im 18/88]
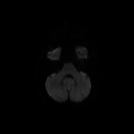
[im 35/88]
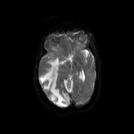
[im 53/88]
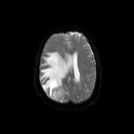
[im 70/88]
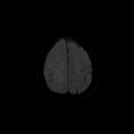
[im 88/88]
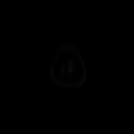

[Series 7: DWI · axial · 3.0mm · 2.09mm/px · z∈[-19,+109]mm · 3 of 44 slices shown (2 of 2)]
[im 1/44]
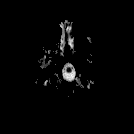
[im 22/44]
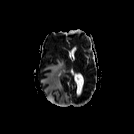
[im 44/44]
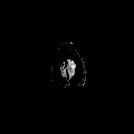

[Series 8: swi_images · axial · 3.0mm · 0.86mm/px · z∈[-37,+114]mm · 4 of 52 slices shown]
[im 1/52]
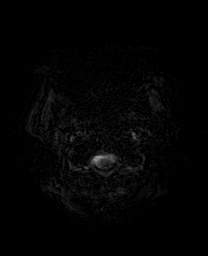
[im 18/52]
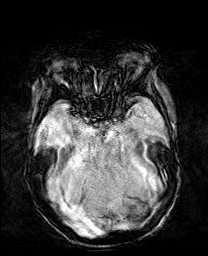
[im 35/52]
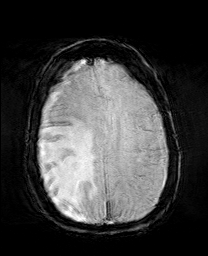
[im 52/52]
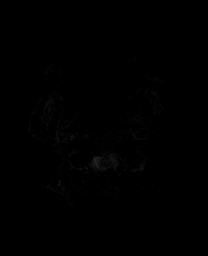

[Series 10: T2 · axial · 5.0mm · 0.57mm/px · z∈[-29,+114]mm · 2 of 25 slices shown]
[im 1/25]
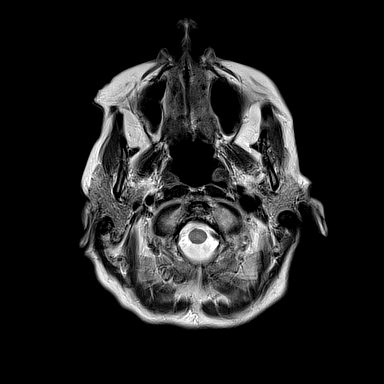
[im 25/25]
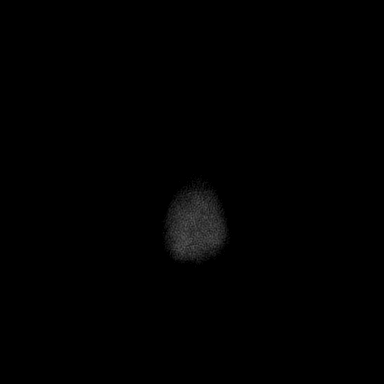

[Series 11: FLAIR · axial · 3.0mm · 0.57mm/px · z∈[-28,+109]mm · 3 of 47 slices shown]
[im 1/47]
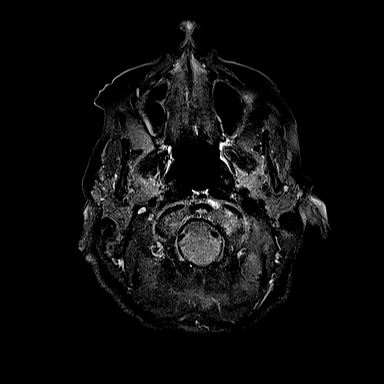
[im 24/47]
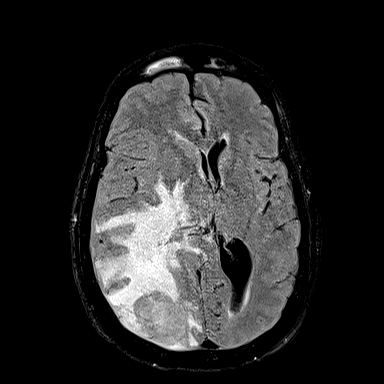
[im 47/47]
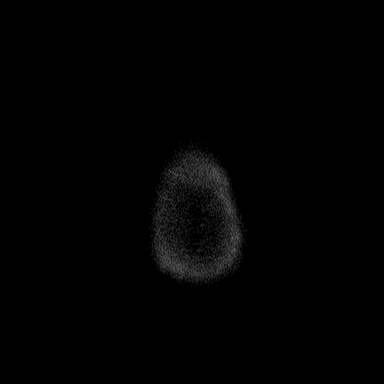

[Series 12: T1 · axial · 1.0mm · 0.94mm/px · z∈[-29,+113]mm · 10 of 144 slices shown (2 of 2)]
[im 1/144]
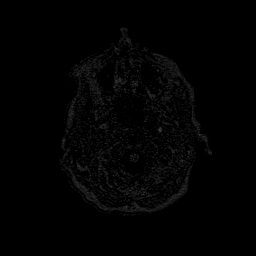
[im 16/144]
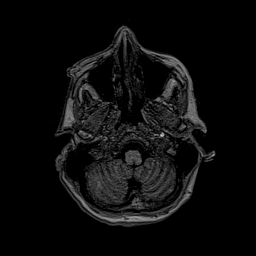
[im 32/144]
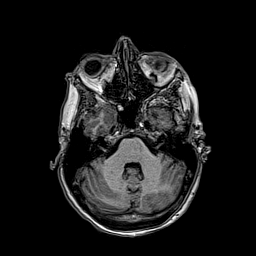
[im 48/144]
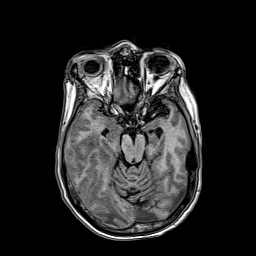
[im 64/144]
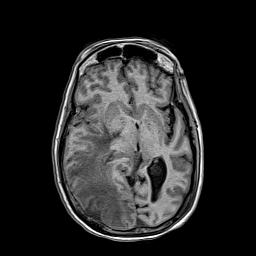
[im 80/144]
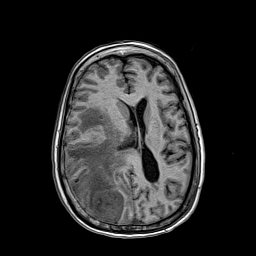
[im 96/144]
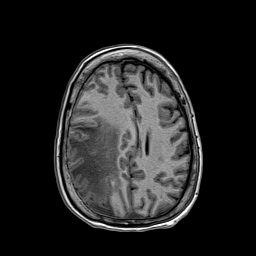
[im 112/144]
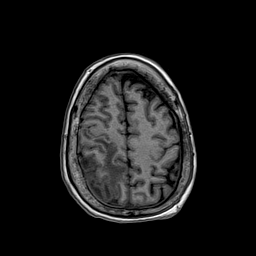
[im 128/144]
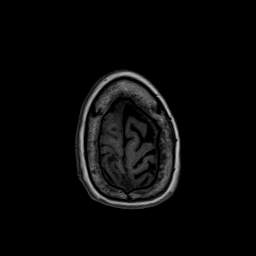
[im 144/144]
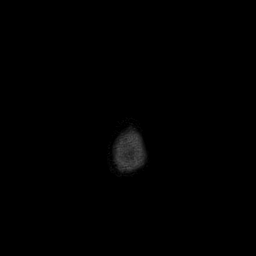

[Series 17: T1 post-contrast · sagittal · 3.0mm · 0.75mm/px · 2 of 33 slices shown (1 of 3)]
[im 1/33]
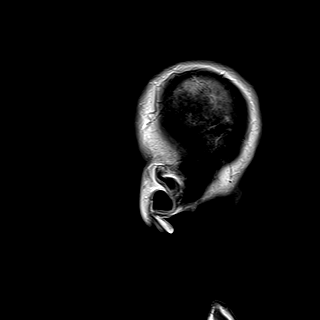
[im 33/33]
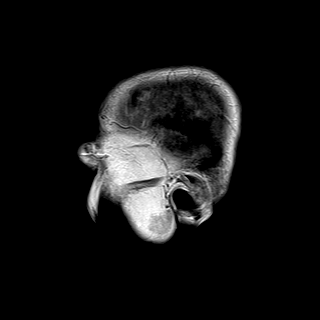

[Series 18: T2 post-contrast · coronal · 3.0mm · 0.57mm/px · 3 of 44 slices shown]
[im 1/44]
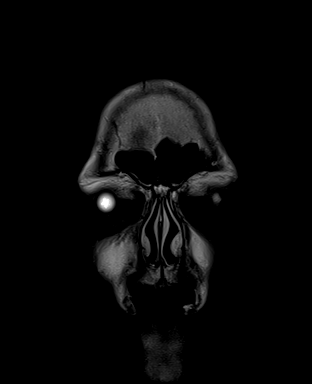
[im 22/44]
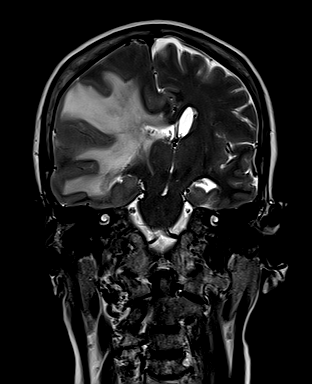
[im 44/44]
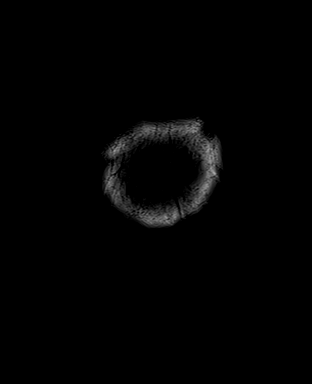

[Series 19: T1 post-contrast · axial · 1.0mm · 0.94mm/px · z∈[-32,+110]mm · 10 of 144 slices shown (2 of 3)]
[im 1/144]
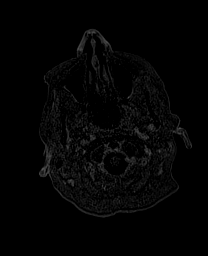
[im 16/144]
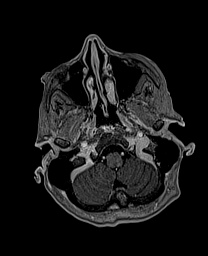
[im 32/144]
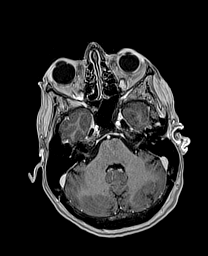
[im 48/144]
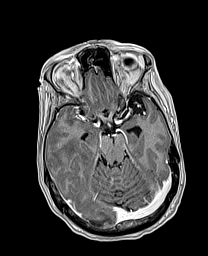
[im 64/144]
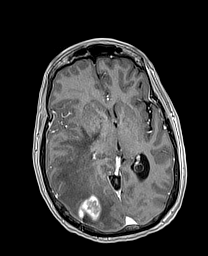
[im 80/144]
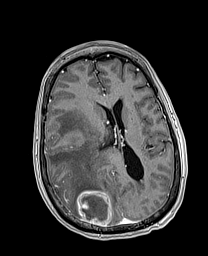
[im 96/144]
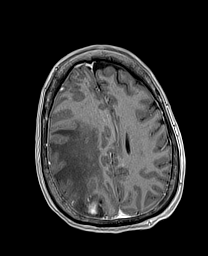
[im 112/144]
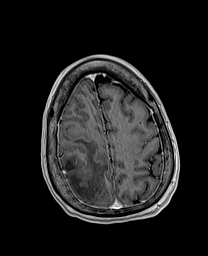
[im 128/144]
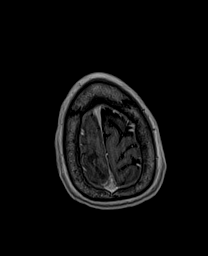
[im 144/144]
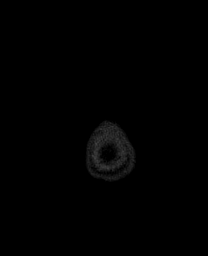

[Series 20: T1 post-contrast · coronal · 3.0mm · 0.57mm/px · 3 of 42 slices shown (3 of 3)]
[im 1/42]
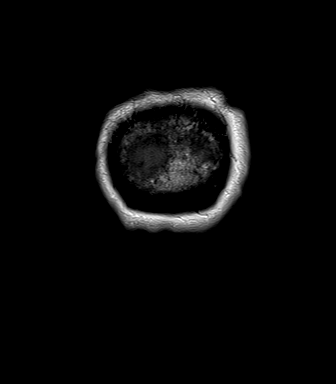
[im 21/42]
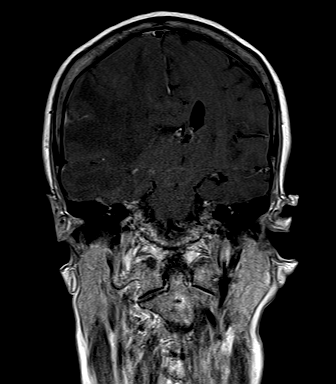
[im 42/42]
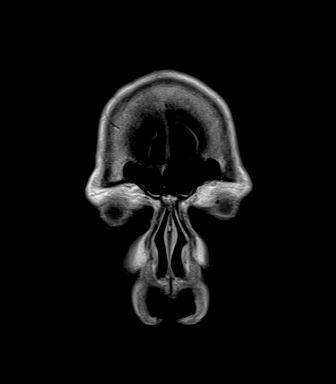

[48 of 48 positions shown; findings below may reference images not displayed]

FINDINGS: Brain: Redemonstration of peripherally enhancing right occipital
mass with extensive surrounding edema. As before, there is
effacement of the right lateral ventricle and leftward midline
shift. Ventricles are similar in size.

No acute abnormality. Small chronic cerebellar infarcts are again
noted.

Vascular: Major vessel flow voids at the skull base are preserved.

Skull and upper cervical spine: Normal marrow signal is preserved.

Sinuses/Orbits: Paranasal sinuses are aerated. Bilateral lens
replacements.

Other: Sella is partially empty.  Mastoid air cells are clear.
IMPRESSION: Right occipital metastasis as seen on the recent prior study. Stable
extensive associated edema with mass effect including leftward
midline shift. No new finding.

## 2021-12-31 MED ORDER — LEVOTHYROXINE SODIUM 75 MCG PO TABS
75.0000 ug | ORAL_TABLET | Freq: Every day | ORAL | Status: DC
  Administered 2022-01-01 – 2022-01-03 (×3): 75 ug via ORAL
  Filled 2021-12-31 (×3): qty 1

## 2021-12-31 MED ORDER — GADOBUTROL 1 MMOL/ML IV SOLN
6.0000 mL | Freq: Once | INTRAVENOUS | Status: AC | PRN
  Administered 2021-12-31: 6 mL via INTRAVENOUS

## 2021-12-31 NOTE — Progress Notes (Signed)
?  [facility] Progress Note ? ?Patient: [name]     ?PJA:[id number]  ?DOA: 12/30/2021   ?PCP: [name], PA  ? ?  ?  ?Brief hospital course: ?This is a 71 year old female with non-small cell lung cancer, COPD, hypothyroidism.  She presents to the hospital for being unsteady on her feet and falling.  In the ED,  CT the head revealed a right hemispheric mass with large volume of vasogenic edema compatible with brain metastasis with a leftward midline shift. 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 ? ?Subjective:  ?The patient feels that she is doing a little bit better today but has not yet ambulated adequately to know for sure.  She has no complaints of headaches, blurred vision or any other neurological symptoms. ? ?Assessment and Plan: ? 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 ?Hypothyroidism ?- Continue levothyroxine ? ? ?DVT prophylaxis:  SCDs Start: 12/30/21 1255 ? ?  Code Status: Full Code  ?Level of Care: Level of care: Progressive ?Disposition Plan:  ?Status is: Inpatient ?Remains inpatient appropriate because: Continue work-up and await CIR evaluation ? ?Objective: ?  ?Vitals:  ? 12/30/21 1800 12/30/21 2056 12/31/21 0815 12/31/21 1137  ?BP:  (!) 110/59 (!) 99/58 (!) 119/51  ?Pulse:  70 63 61  ?Resp:  18 16 18   ?Temp:  98.5 ?F (36.9 ?C) 97.7 ?F (36.5 ?C) 97.9 ?F (36.6 ?C)  ?TempSrc:  Oral Oral Oral  ?SpO2:  96% 100% 98%  ?Weight: 56.8 kg     ?Height: 5\' 3"  (1.6 m)     ? ?Filed Weights  ? 12/30/21 1800  ?Weight: 56.8 kg  ? ?Exam: ?General  exam: Appears comfortable  ?HEENT: PERRLA, oral mucosa moist, no sclera icterus or thrush ?Respiratory system: Clear to auscultation. Respiratory effort normal. ?Cardiovascular system: S1 & S2 heard, regular rate and rhythm ?Gastrointestinal system: Abdomen soft, non-tender, nondistended. Normal bowel sounds   ?Central nervous system: Alert and oriented. No focal neurological deficits. ?Extremities: No cyanosis, clubbing or edema ?Skin: No rashes or ulcers ?Psychiatry:  Mood & affect appropriate.   ? ?Imaging and lab data was personally reviewed ? ? ? CBC: ?Recent Labs  ?Lab 12/30/21 ?1011 12/31/21 ?0451  ?WBC 6.1 10.9*  ?NEUTROABS 3.9  --   ?HGB 12.2 13.4  ?HCT 37.3 39.5  ?MCV 96.9 94.3  ?PLT 185 226  ? ?Basic Metabolic Panel: ?Recent Labs  ?Lab 12/30/21 ?1011 12/31/21 ?0451  ?NA 139 140  ?K 4.7 3.8  ?CL 110 108  ?CO2 26 24  ?GLUCOSE 88 117*  ?BUN 11 12  ?CREATININE 0.43* 0.43*  ?CALCIUM 7.8* 9.3  ?MG 2.2  --   ? ?GFR: ?Estimated Creatinine Clearance: 52.6 mL/min (A) (by C-G formula based on SCr of 0.43 mg/dL (L)). ? ?Scheduled Meds: ? dexamethasone (DECADRON) injection  4 mg Intravenous Q6H  ? levothyroxine  75 mcg Oral QAC breakfast  ? ?Continuous Infusions: ? ? LOS: 1 day  ? ?Author: ?Debbe Odea  ?12/31/2021 6:20 PM ?   ?

## 2021-12-31 NOTE — Progress Notes (Signed)
?  Radiation Oncology         [phone] ?________________________________ ? ?Name: KEYONI LAPINSKI MRN: [id number]  ?Date: 12/31/2021  DOB: 1951-03-02 ? ?INPATIENT ? ?SIMULATION AND TREATMENT PLANNING NOTE ? ?  ICD-10-CM   ?1. Malignant neoplasm metastatic to brain [facility])  C79.31   ?  ? ? ?DIAGNOSIS:  71 yo woman with a 3.5 cm solitary right occipital brain metastasis from adenocarcinoma of the right upper lung. ? ?NARRATIVE:  The patient was brought to the [facility].  Identity was confirmed.  All relevant records and images related to the planned course of therapy were reviewed.  The patient freely provided informed written consent to proceed with treatment after reviewing the details related to the planned course of therapy. The consent form was witnessed and verified by the simulation staff. Intravenous access was established for contrast administration. Then, the patient was set-up in a stable reproducible supine position for radiation therapy.  A relocatable thermoplastic stereotactic head frame was fabricated for precise immobilization.  CT images were obtained.  Surface markings were placed.  The CT images were loaded into the planning software and fused with the patient's targeting MRI scan.  Then the target and avoidance structures were contoured.  Treatment planning then occurred.  The radiation prescription was entered and confirmed.  I have requested 3D planning  I have requested a DVH of the following structures: Brain stem, brain, left eye, right eye, lenses, optic chiasm, target volumes, uninvolved brain, and normal tissue.   ? ?SPECIAL TREATMENT PROCEDURE:  The planned course of therapy using radiation constitutes a special treatment procedure. Special care is required in the management of this patient for the following reasons. This treatment constitutes a Special Treatment Procedure for the following reason: High dose per fraction requiring special monitoring for increased  toxicities of treatment including daily imaging.  The special nature of the planned course of radiotherapy will require increased physician supervision and oversight to ensure patient's safety with optimal treatment outcomes.  This requires extended time and effort. ? ?PLAN:  The patient will receive pre-op stereotactic radiotherapy to 24 Gy in 3 fractions followed by craniotomy for resection. ? ?________________________________ ? ?Sheral Apley Tammi Klippel, M.D. ? ?

## 2021-12-31 NOTE — Progress Notes (Signed)
HEMATOLOGY-ONCOLOGY PROGRESS NOTE ? ?ASSESSMENT AND PLAN: ?This is a very pleasant 71 year old white female with a stage IIIa (T1b, N2, M0) non-small cell lung cancer, adenocarcinoma presented with right upper lobe lung nodule in addition to mediastinal lymphadenopathy.  PD-L1 expression 90%. ?The patient underwent a course of neoadjuvant treatment with carboplatin for AUC of 5, Alimta 500 mg/M2 and Keytruda 200 mg IV every 3 weeks for 3 cycles.   ?She tolerated this treatment well with no concerning adverse effect except for dry skin and itching. Her repeat CT scan after the neoadjuvant treatment showed partial response with around 50% reduction in the tumor volume. ? ?The patient underwent right upper lobectomy with lymph node dissection on October 19, 2020 and the final pathology showed residual tumor measuring 0.5 cm with lymph node metastasis involving hilar and mediastinal lymph nodes. She underwent adjuvant systemic chemotherapy with carboplatin for AUC of 5, Alimta 500 mg/M2 every 3 weeks since she has good response to this treatment in the past. Status post 4 cycles. She tolerated this treatment well with no concerning adverse effect except for mild fatigue. ? ?She is currently undergoing adjuvant immunotherapy with Tecentriq 1200 Mg IV every 3 weeks status post 11 cycles.  She was due to follow-up with [place] recently for cycle #12 of Tecentriq but this was delayed due to COVID-19 infection.  She was supposed to be seen today to receive cycle 12, but is now hospitalized due to difficulty walking and falls.  She was unfortunately found to have a brain metastasis.  She has been started on dexamethasone and radiation oncology consult is pending.  Neurosurgery has also been consulted.  Management of her brain metastasis per radiation oncology and neurosurgery. ? ?We will arrange outpatient follow-up once she recovers from treatment for her brain metastasis. ? ?Mikey Bussing, DNP, AGPCNP-BC, AOCNP ? ?SUBJECTIVE:  Ms. Clarey is followed by our office for stage IIIa (T1b, N2, M0) non-small cell lung cancer, adenocarcinoma.  She initially received neoadjuvant chemotherapy followed by robotic assisted right thoracoscopy with right upper lobectomy and lymph node dissection, and then was started on adjuvant systemic chemotherapy with carboplatin for AUC of 5 and Alimta 500 mg per metered squared every 3 weeks.  She completed 4 cycles of chemotherapy on 02/13/2021.  She then started adjuvant immunotherapy with atezolizumab 200 mg IV every 3 weeks which was first given on 03/27/2021.  She is status post 11 cycles.  Cycle 12 has been delayed due to recent COVID-19 infection.  Now hospitalized due to difficulty standing and walking.  An MRI of the brain with and without contrast was performed on admission which showed a peripherally enhancing lesion in the right occipital lobe which measured 31 x 35 x 32 mm and likely represents a focal metastasis from the patient's known lung cancer.  She has been started on dexamethasone.  Radiation oncology consult is pending at the time of my visit. ? ?This morning, she reports that she is feeling somewhat better.  She is still very unsteady on her feet.  She is using a walker to help with ambulation.  She is not having any headaches.  No other complaints voiced this morning. ? ?Oncology History  ?Malignant neoplasm of upper lobe of right lung ([place])  ?05/10/2020 Initial Diagnosis  ? Malignant neoplasm of upper lobe of right lung ([place]) ? ?  ?06/08/2020 - 07/20/2020 Chemotherapy  ?  ? ?  ? ?  ?11/27/2020 Cancer Staging  ? Staging form: Lung, AJCC 8th Edition ?-  Clinical: Stage IIIA (cT1b, cN2, cM0) - Signed by Curt Bears, MD on 11/27/2020 ? ?  ?12/11/2020 - 02/13/2021 Chemotherapy  ?  ? ?  ? ?  ?03/27/2021 -  Chemotherapy  ? Patient is on Treatment Plan : LUNG NSCLC Atezolizumab q21d  ? ?  ?  ? ? ? ?REVIEW OF SYSTEMS:   ?Review of Systems  ?Constitutional:  Negative for chills and fever.  ?HENT:  Negative.    ?Eyes: Negative.   ?Respiratory: Negative.    ?Cardiovascular: Negative.   ?Gastrointestinal: Negative.   ?Skin: Negative.   ?Neurological: Negative.   ?Psychiatric/Behavioral: Negative.    ? ?I have reviewed the past medical history, past surgical history, social history and family history with the patient and they are unchanged from previous note. ? ? ?PHYSICAL EXAMINATION: ?ECOG PERFORMANCE STATUS: 2 - Symptomatic, <50% confined to bed ? ?Vitals:  ? 12/30/21 2056 12/31/21 0815  ?BP: (!) 110/59 (!) 99/58  ?Pulse: 70 63  ?Resp: 18 16  ?Temp: 98.5 ?F (36.9 ?C) 97.7 ?F (36.5 ?C)  ?SpO2: 96% 100%  ? ?Filed Weights  ? 12/30/21 1800  ?Weight: 56.8 kg  ? ? ?Intake/Output from previous day: ?04/16 0701 - 04/17 0700 ?In: 166.5 [P.O.:120; IV Piggyback:46.5] ?Out: 150 [Urine:150] ? ?Physical Exam ?Vitals reviewed.  ?Constitutional:   ?   General: She is not in acute distress. ?HENT:  ?   Head: Normocephalic.  ?Skin: ?   General: Skin is warm and dry.  ?Neurological:  ?   General: No focal deficit present.  ?   Mental Status: She is alert and oriented to person, place, and time.  ?Psychiatric:     ?   Mood and Affect: Mood normal.     ?   Behavior: Behavior normal.     ?   Thought Content: Thought content normal.     ?   Judgment: Judgment normal.  ? ? ?LABORATORY DATA:  ?I have reviewed the data as listed ? ?  Latest Ref Rng & Units 12/31/2021  ?  4:51 AM 12/30/2021  ? 10:11 AM 11/14/2021  ?  8:23 AM  ?CMP  ?Glucose 70 - 99 mg/dL 117   88   80    ?BUN 8 - 23 mg/dL _0 ?Creatinine 0.44 - 1.00 mg/dL 0.43   0.43   0.54    ?Sodium 135 - 145 mmol/L 140   139   142    ?Potassium 3.5 - 5.1 mmol/L 3.8   4.7   3.6    ?Chloride 98 - 111 mmol/L 108   110   107    ?CO2 22 - 32 mmol/L _1 ?Calcium 8.9 - 10.3 mg/dL 9.3   7.8   9.4    ?Total Protein 6.5 - 8.1 g/dL 7.5   5.9   7.0    ?Total Bilirubin 0.3 - 1.2 mg/dL 0.7   0.9   0.5    ?Alkaline Phos 38 - 126 U/L 53   41   43    ?AST 15 - 41 U/L _2 ?ALT 0 - 44 U/L _3 ? ? ?Lab Results  ?Component Value Date  ? WBC 10.9 (H) 12/31/2021  ? HGB 13.4 12/31/2021  ? HCT 39.5 12/31/2021  ? MCV 94.3 12/31/2021  ? PLT 226 12/31/2021  ?  NEUTROABS 3.9 12/30/2021  ? ? ?No results found for: CEA1, CEA, [id number], CA125, PSA1 ? ?DG Chest 2 View ? ?Result Date: 12/30/2021 ?CLINICAL DATA:  71 year old female with lung cancer. Altered mental status and dizziness. 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 Fall. EXAM: CHEST - 2 VIEW COMPARISON:  Chest CT 08/16/2021 and earlier. FINDINGS: Semi upright AP and lateral views of the chest at 1001 hours. Chronic postoperative volume reduction in the right lung appears stable. Stable cardiac size and mediastinal contours. Visualized tracheal air column is within normal limits. No pneumothorax, pulmonary edema, pleural effusion or acute pulmonary opacity identified. No acute osseous abnormality identified. Negative visible bowel gas. IMPRESSION: Post treatment changes to the right lung. No acute cardiopulmonary abnormality or acute traumatic injury identified. Electronically Signed   By: Genevie Ann M.D.   On: 12/30/2021 10:17  ? ?CT HEAD W & WO CONTRAST (5MM) ? ?Addendum Date: 12/30/2021   ?ADDENDUM REPORT: 12/30/2021 10:18 ADDENDUM: Critical Value/emergent results were called by telephone at the time of interpretation on 12/30/2021 At 1015 hours to Dr. Isla Pence , who verbally acknowledged these results. Electronically Signed   By: Genevie Ann M.D.   On: 12/30/2021 10:18  ? ?Result Date: 12/30/2021 ?CLINICAL DATA:  71 year old female with lung cancer. Altered mental status and dizziness. Brain MRI in 2021 was negative for metastatic disease. EXAM: CT HEAD WITHOUT AND WITH CONTRAST TECHNIQUE: Contiguous axial images were obtained from the base of the skull through the vertex without and with intravenous contrast. RADIATION DOSE REDUCTION: This exam was performed according to the departmental dose-optimization program which  includes automated exposure control, adjustment of the mA and/or kV according to patient size and/or use of iterative reconstruction technique. CONTRAST:  76m OMNIPAQUE IOHEXOL 300 MG/ML  SOLN COMPARISON:  [facility]

## 2021-12-31 NOTE — Progress Notes (Addendum)
?Radiation Oncology         [phone] ?________________________________ ? ?Initial Inpatient Consultation ?(Same Day Simulation) ? ?Name: [name] MRN: [id number]  ?Date of Service: 12/30/2021 DOB: Mar 07, 1951 ? ?LF:YBOFBPZ, [place], PA  No ref. provider found  ? ?REFERRING PHYSICIAN: Sherley Bounds, MD. Neurosurgery ? ?ONCOLOGIST:  Curt Bears, MD PhD ? ?DIAGNOSIS: 71 yo woman with a 3.5 cm solitary right occipital brain metastasis from Stage T1b N2 M0 adenocarcinoma of the right upper lung with 90% PD-L1 expression on immunotherapy ? ?  ICD-10-CM   ?1. Malignant neoplasm of lung metastatic to brain [facility])  C34.90   ? C79.31   ?  ?2. Vasogenic edema ([place])  G93.6   ?  ? ? ?HISTORY OF PRESENT ILLNESS: [name] is a 71 y.o. female seen at the request of Dr. Ronnald Ramp.  The patient has been under the care of Dr. Julien Nordmann for lung cancer that was discovered on 03/10/2020 when she had a low-dose lung cancer screening CT scan which demonstrated a right upper lobe pulmonary lesion with right paratracheal lymphadenopathy.   ? ? ? ?The patient then had a PET scan performed on 04/06/2020 which confirmed a hypermetabolic 1.1 cm right upper lobe pulmonary nodule with hypermetabolic right upper lobe paratracheal adenopathy.  There was equivocal activity in the right supraclavicular lymph node which was not enlarged.  There was also a 6 x 5 mm right middle lobe pulmonary nodule that was not hypermetabolic but was also below the sensitive PET/CT size threshold. ? ? ?  ?The patient was then seen by surgeon, Dr. Roxan Hockey on 04/25/2020 who recommended a bronchoscopy and biopsy. The patient had a bronchoscopy/biopsy on 05/08/2020 which the pathology was consistent with adenocarcinoma. ?  ?The patient had negative staging brain MRI on 05/18/20 ? ?She received neoadjuvant systemic chemotherapy with carboplatin for AUC of 5, Alimta 500 mg/M2 and Keytruda 200 mg IV every 3 weeks.  First dose 05/30/2020. Status post 3 cycle. ? ?She  then underwent robotic assisted right thoracoscopy with right upper lobectomy and lymph node dissection under the care of Dr. Roxan Hockey on October 19, 2020.  There was residual tumor measuring 0.5 cm with lymph node metastasis involving 4R, 12 and hilar lymph nodes.  Molecular pathology showed at PD-L! Tumor Proportion Score (TPS) of 90%. ? ?She was then treated with adjuvant chemotherapy with carboplatin for AUC of 5 and Alimta 500 Mg/M2 every 3 weeks.  First dose was given December 11, 2020.  Status post 4 cycles.  Last dose of chemotherapy was given Feb 13, 2021. ? ?She is now receiving maintnence adjuvant immunotherapy with Atezolizumab 1200 Mg IV every 3 weeks.  First dose March 27, 2021.  Status post 10 cycles. ?  ?She missed the last cycle due to Covid + status on 3/20.  She was also supposed to go back on 4/10 for another infusion and her husband canceled that as well.  Last infusion was on 3/1.  Patient presented to the Emergency Department yesterday saying that she tried to get up out of bed and felt very dizzy and weak.  She was unable to get up and slid off the bed.  Husband unable to get her up and she was on the ground when EMS arrived. ? ?Head CT on 12/30/21 showed a lobulated, nodular and enhancing mass in the posterior right hemisphere center at the right parieto-occipital sulcus is 34 by 40 x 33 mm (AP by transverse by CC) with a large volume of right  hemisphere vasogenic edema. Associated intracranial mass effect with leftward midline shift of up to 13 mm (coronal image 28).  ? ? ? ?Brain MRI confirmed a right occipital lobe tumor demonstrates predominantlyperipheral enhancement. A solid nodule is present in the inferolateral aspect of the tumor measuring 13 mm. The peripherally enhancing component measures 31 x 35 x 32 mm. ? ? ? ?Her case was presented in our multidisciplinary brain tumor conference this morning for discussion with neuro-oncology, neurosurgery and radiation oncology. ? ?She was  started on dexamethasone, and feels a little improved. ? ? ?PREVIOUS RADIATION THERAPY: No ? ?PAST MEDICAL HISTORY:  ?Past Medical History:  ?Diagnosis Date  ? Cataract   ? COPD (chronic obstructive pulmonary disease) ([place])   ?   ? ?PAST SURGICAL HISTORY: ?Past Surgical History:  ?Procedure Laterality Date  ? APPENDECTOMY  2008  ? COLON SURGERY  2008  ? colostomy-infection post op appendectomy  ? COLOSTOMY REVERSAL  2009  ? INTERCOSTAL NERVE BLOCK Right 10/19/2020  ? Procedure: INTERCOSTAL NERVE BLOCK;  Surgeon: [name], MD;  Location: [facility];  Service: Thoracic;  Laterality: Right;  ? NODE DISSECTION Right 10/19/2020  ? Procedure: NODE DISSECTION;  Surgeon: [name], MD;  Location: [facility];  Service: Thoracic;  Laterality: Right;  ? VIDEO BRONCHOSCOPY WITH ENDOBRONCHIAL NAVIGATION N/A 05/08/2020  ? Procedure: VIDEO BRONCHOSCOPY WITH ENDOBRONCHIAL NAVIGATION;  Surgeon: [name], MD;  Location: [place];  Service: Thoracic;  Laterality: N/A;  ? VIDEO BRONCHOSCOPY WITH ENDOBRONCHIAL ULTRASOUND N/A 05/08/2020  ? Procedure: VIDEO BRONCHOSCOPY WITH ENDOBRONCHIAL ULTRASOUND;  Surgeon: [name], MD;  Location: [facility];  Service: Thoracic;  Laterality: N/A;  ? ? ?FAMILY HISTORY:  ?Family History  ?Problem Relation Age of Onset  ? Mental illness Mother   ? Colon cancer Mother 83  ? Cancer Father   ? Colon polyps Neg Hx   ? Esophageal cancer Neg Hx   ? Stomach cancer Neg Hx   ? Rectal cancer Neg Hx   ? ? ?SOCIAL HISTORY:  ?Social History  ? ?Socioeconomic History  ? Marital status: Married  ?  Spouse name: Not on file  ? Number of children: Not on file  ? Years of education: Not on file  ? Highest education level: Not on file  ?Occupational History  ? Not on file  ?Tobacco Use  ? Smoking status: Former  ?  Packs/day: 0.05  ?  Years: 50.00  ?  Pack years: 2.50  ?  Types: Cigarettes  ?  Quit date: 09/17/2019  ?  Years since quitting: 2.2  ? Smokeless tobacco: Never  ?Vaping Use  ? Vaping  Use: Never used  ?Substance and Sexual Activity  ? Alcohol use: Not Currently  ?  Alcohol/week: 10.0 standard drinks  ?  Types: 10 Cans of beer per week  ? Drug use: No  ? Sexual activity: Not on file  ?Other Topics Concern  ? Not on file  ?Social History Narrative  ? Not on file  ? ?Social Determinants of Health  ? ?Financial Resource Strain: Not on file  ?Food Insecurity: Not on file  ?Transportation Needs: Not on file  ?Physical Activity: Not on file  ?Stress: Not on file  ?Social Connections: Not on file  ?Intimate Partner Violence: Not on file  ? ? ?ALLERGIES: Patient has no known allergies. ? ?MEDICATIONS:  ?Current Facility-Administered Medications  ?Medication Dose Route Frequency Provider Last Rate Last Admin  ? dexamethasone (DECADRON) injection 4 mg  4  mg Intravenous Q6H Kyle, Tyrone A, DO   4 mg at 12/31/21 9244  ? levothyroxine (SYNTHROID) tablet 75 mcg  75 mcg Oral QAC breakfast Debbe Odea, MD      ? LORazepam (ATIVAN) injection 1 mg  1 mg Intravenous Once PRN Cherylann Ratel A, DO      ? LORazepam (ATIVAN) injection 1 mg  1 mg Intravenous Q6H PRN Marylyn Ishihara, Tyrone A, DO      ? morphine (PF) 2 MG/ML injection 2 mg  2 mg Intravenous Q4H PRN Marylyn Ishihara, Tyrone A, DO      ? oxyCODONE (Oxy IR/ROXICODONE) immediate release tablet 5 mg  5 mg Oral Q4H PRN Marylyn Ishihara, Tyrone A, DO      ? prochlorperazine (COMPAZINE) injection 10 mg  10 mg Intravenous Q6H PRN Marylyn Ishihara, Tyrone A, DO      ? ? ?REVIEW OF SYSTEMS:  On review of systems, the patient reports that she is doing well overall. she denies any chest pain, shortness of breath, cough, fevers, chills, night sweats, unintended weight changes. she denies any bowel or bladder disturbances, and denies abdominal pain, nausea or vomiting. she denies any new musculoskeletal or joint aches or pains. A complete review of systems is obtained and is otherwise negative. ? ?  ?PHYSICAL EXAM:  ?Wt Readings from Last 3 Encounters:  ?12/30/21 125 lb 3.5 oz (56.8 kg)  ?11/14/21 122 lb 3.2 oz  (55.4 kg)  ?10/22/21 122 lb 11.2 oz (55.7 kg)  ? ?Temp Readings from Last 3 Encounters:  ?12/31/21 97.7 ?F (36.5 ?C) (Oral)  ?11/14/21 97.7 ?F (36.5 ?C) (Tympanic)  ?10/22/21 97.7 ?F (36.5 ?C) (Tympanic)  ? ?BP U[facility]ess Mort

## 2021-12-31 NOTE — Evaluation (Signed)
Physical Therapy Evaluation ?Patient Details ?Name: [name] ?MRN: [id number] ?DOB: [birth year] ?Today's Date: 12/31/2021 ? ?History of Present Illness ? 71 y.o. female admitted with headaches, weakness and fall. Head CT on 12/30/21 showed a lobulated, nodular and enhancing mass in the posterior right hemisphere center at the right parieto-occipital sulcus is 34 by 40 x 33 mm  with a large volume of right hemisphere vasogenic edema, Associated intracranial mass effect with leftward midline shift of up to 13 mm; pt is now scheduled for SRS followed by curative craniotomy.   PMH: lung cancer dx 2021, s/p robotic assisted right thoracoscopy with right upper lobectomy and lymph node dissection,  hypothyroidism, depression, COPD.  ?Clinical Impression ? Pt admitted with above diagnosis.  ?Per pt sister pt has been independent until the last ~ 3wks. Recent functional decline. Pt  is confused, oriented to self only at time of PT eval. Pt is requiring mod to max assist for transfers, demonstrates decr vision, decr attention to L side, poor motor planning and decr balance. Feel pt may benefit from CIR once tx/surgery complete.  ? Pt currently with functional limitations due to the deficits listed below (see PT Problem List). Pt will benefit from skilled PT to increase their independence and safety with mobility to allow discharge to the venue listed below.   ?   ?   ? ?Recommendations for follow up therapy are one component of a multi-disciplinary discharge planning process, led by the attending physician.  Recommendations may be updated based on patient status, additional functional criteria and insurance authorization. ? ?Follow Up Recommendations Acute inpatient rehab (3hours/day) ? ?  ?Assistance Recommended at Discharge Frequent or constant Supervision/Assistance  ?Patient can return home with the following ? A lot of help with walking and/or transfers;A lot of help with bathing/dressing/bathroom;Assist for  transportation;Direct supervision/assist for financial management;Help with stairs or ramp for entrance;Direct supervision/assist for medications management;Assistance with cooking/housework ? ?  ?Equipment Recommendations Other (comment) (TBD)  ?Recommendations for Other Services ?    ?  ?Functional Status Assessment Patient has had a recent decline in their functional status and demonstrates the ability to make significant improvements in function in a reasonable and predictable amount of time.  ? ?  ?Precautions / Restrictions Precautions ?Precautions: Fall ?Restrictions ?Weight Bearing Restrictions: No  ? ?  ? ?Mobility ? Bed Mobility ?  ?  ?  ?  ?  ?  ?  ?General bed mobility comments: pt in recliner and returned to same ?  ? ?Transfers ?Overall transfer level: Needs assistance ?Equipment used: Rolling walker (2 wheels), None ?Transfers: Sit to/from Stand, Bed to chair/wheelchair/BSC ?Sit to Stand: Mod assist ?  ?Step pivot transfers: Mod assist, Max assist ?  ?  ?  ?General transfer comment: multi-modal cues for hand placement and task sequence. pt requires mod to max assist to stand and pivot to [facility], pt was incontinent of urine in chair.  stand pivot BSC to bed with RW and mod assist, multi-modal cues for sequencing,  RW position, attention to L side and foot position. ?  ? ?Ambulation/Gait ?  ?  ?  ?  ?  ?  ?  ?  ? ?Stairs ?  ?  ?  ?  ?  ? ?Wheelchair Mobility ?  ? ?Modified Rankin (Stroke Patients Only) ?  ? ?  ? ?Balance Overall balance assessment: Needs assistance, History of Falls ?Sitting-balance support: Feet supported, Feet unsupported, No upper extremity supported ?Sitting balance-Leahy Scale: Fair ?  ?  ?  Standing balance support: During functional activity, No upper extremity supported, Bilateral upper extremity supported ?Standing balance-Leahy Scale: Zero ?  ?  ?  ?  ?  ?  ?  ?  ?  ?  ?  ?  ?   ? ? ? ?Pertinent Vitals/Pain Pain Assessment ?Pain Assessment: No/denies pain  ? ? ?Home Living  Family/patient expects to be discharged to:: Unsure ?Living Arrangements: Spouse/significant other ?Available Help at Discharge: Family ?Type of Home: House ?Home Access: Stairs to enter ?  ?Entrance Stairs-Number of Steps: 1 or 3 ?  ?Home Layout: One level ?Home Equipment: None ?Additional Comments: pt sister present (sister resides in [facility]) and answers most of PLOF question, pt is an unreliable hx at this time  ?  ?Prior Function Prior Level of Function : Independent/Modified Independent ?  ?  ?  ?  ?  ?  ?Mobility Comments: per pt sister pt has been grossly indpendent until ~ the last 3-4wks. functional decline per pt sister, husband is unable to provide significant physical assist. ?  ?  ? ? ?Hand Dominance  ?   ? ?  ?Extremity/Trunk Assessment  ? Upper Extremity Assessment ?Upper Extremity Assessment: Defer to OT evaluation;LUE deficits/detail;Difficult to assess due to impaired cognition ?LUE Deficits / Details: AAROM WFL, strength testing limited by cognition ?LUE Sensation: decreased light touch ?LUE Coordination: decreased fine motor;decreased gross motor ?  ? ?Lower Extremity Assessment ?Lower Extremity Assessment: RLE deficits/detail;LLE deficits/detail;Difficult to assess due to impaired cognition ?RLE Deficits / Details: grossly 3+/5, AROM WFL ?LLE Deficits / Details: AAROM grossly WFL, strength 2+ to 3/5, cognition limits testing ?  ? ?   ?Communication  ? Communication: Expressive difficulties;Receptive difficulties  ?Cognition Arousal/Alertness: Awake/alert ?Behavior During Therapy: [facility] for tasks assessed/performed ?Overall Cognitive Status: Impaired/Different from baseline ?Area of Impairment: Memory, Attention, Orientation, Following commands, Safety/judgement, Problem solving ?  ?  ?  ?  ?  ?  ?  ?  ?Orientation Level: Disoriented to, Place, Time, Situation ?Current Attention Level: Focused ?Memory: Decreased short-term memory ?Following Commands: Follows one step commands with increased time,  Follows multi-step commands inconsistently ?Safety/Judgement: Decreased awareness of safety, Decreased awareness of deficits ?  ?Problem Solving: Slow processing, Decreased initiation, Difficulty sequencing, Requires verbal cues, Requires tactile cues ?General Comments: pt states she is [place], recognizes her sister in the room however she is having intermittent visual hallucinations (seeing a rabbit which was actually a bag) and attempting to eat her blanket, thinks it is pie. of note pt hasd ativan prior to her simulation and this may be further effecting her cognition hwoever RN does endorse confusion today prior to pt having meds. ?  ?  ? ?  ?General Comments   ? ?  ?Exercises    ? ?Assessment/Plan  ?  ?PT Assessment Patient needs continued PT services  ?PT Problem List Decreased strength;Decreased mobility;Decreased range of motion;Decreased activity tolerance;Decreased balance;Pain;Decreased cognition;Decreased knowledge of use of DME ? ?   ?  ?PT Treatment Interventions Therapeutic exercise;Functional mobility training;Therapeutic activities;Patient/family education;Gait training;Balance training;DME instruction   ? ?PT Goals (Current goals can be found in the Care Plan section)  ?Acute Rehab PT Goals ?PT Goal Formulation: Patient unable to participate in goal setting ?Time For Goal Achievement: 01/14/22 ?Potential to Achieve Goals: Good ? ?  ?Frequency Min 3X/week ?  ? ? ?Co-evaluation   ?  ?  ?  ?  ? ? ?  ?AM-PAC PT "6 Clicks" Mobility  ?Outcome Measure Help needed turning from  your back to your side while in a flat bed without using bedrails?: A Little ?Help needed moving from lying on your back to sitting on the side of a flat bed without using bedrails?: A Little ?Help needed moving to and from a bed to a chair (including a wheelchair)?: A Lot ?Help needed standing up from a chair using your arms (e.g., wheelchair or bedside chair)?: A Lot ?Help needed to walk in hospital room?: Total ?Help needed climbing  3-5 steps with a railing? : Total ?6 Click Score: 12 ? ?  ?End of Session Equipment Utilized During Treatment: Gait belt ?Activity Tolerance: Patient tolerated treatment well ?Patient left: in chair;with call bell/phone withi

## 2022-01-01 ENCOUNTER — Inpatient Hospital Stay (HOSPITAL_COMMUNITY): Payer: Medicare Other

## 2022-01-01 DIAGNOSIS — C3411 Malignant neoplasm of upper lobe, right bronchus or lung: Secondary | ICD-10-CM | POA: Diagnosis not present

## 2022-01-01 DIAGNOSIS — E039 Hypothyroidism, unspecified: Secondary | ICD-10-CM

## 2022-01-01 DIAGNOSIS — C3490 Malignant neoplasm of unspecified part of unspecified bronchus or lung: Secondary | ICD-10-CM | POA: Diagnosis not present

## 2022-01-01 DIAGNOSIS — C7931 Secondary malignant neoplasm of brain: Secondary | ICD-10-CM | POA: Diagnosis not present

## 2022-01-01 IMAGING — CT CT CHEST-ABD-PELV W/ CM
2 of 5 series · 12 of 36 positions shown, 14 images · IV contrast (APPLIED)
Comparison: Chest CT [DATE].  PET-CT [DATE]

CLINICAL DATA: Metastatic brain lesions seen on recent head CT and
brain MRI. Prior history of lung cancer status post right upper lobe
lobectomy.

EXAM:
CT CHEST, ABDOMEN, AND PELVIS WITH CONTRAST
TECHNIQUE: Multidetector CT imaging of the chest, abdomen and pelvis was
performed following the standard protocol during bolus
administration of intravenous contrast.

[Series 2: cap with · axial · 0.70mm/px · z∈[-643,-143]mm · 9 of 126 slices shown, 11 images]
[im 13/126  mediastinal]
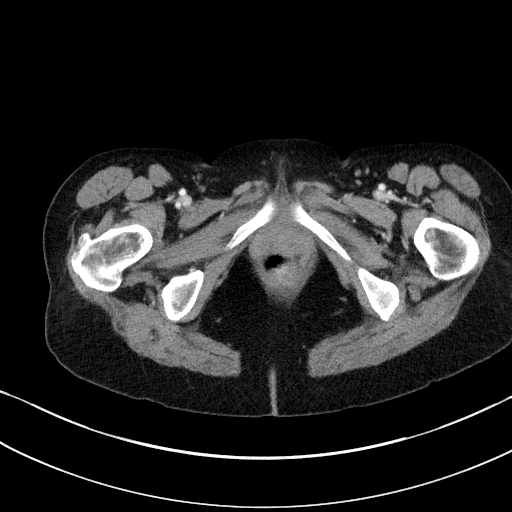
[im 13/126  bone]
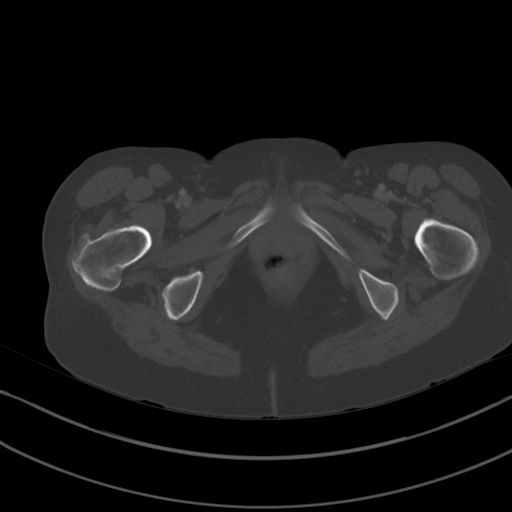
[im 26/126  mediastinal]
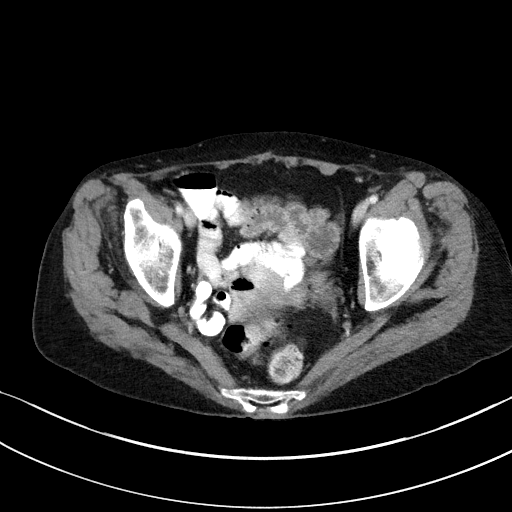
[im 38/126  mediastinal]
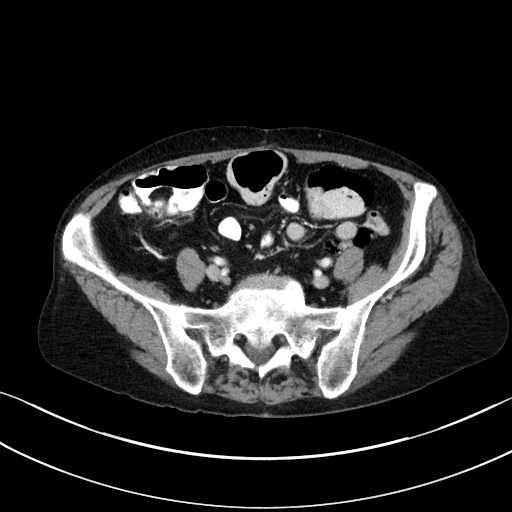
[im 51/126  mediastinal]
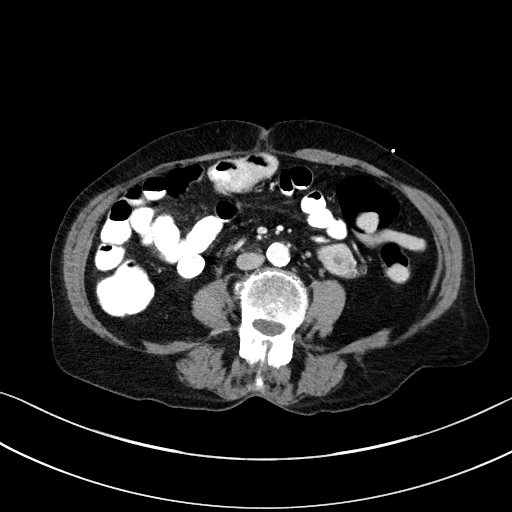
[im 63/126  mediastinal]
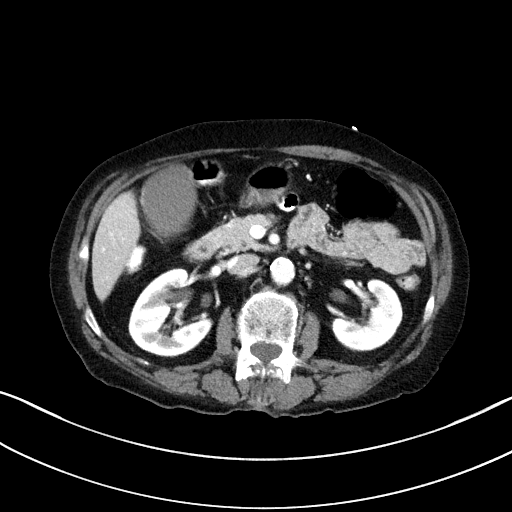
[im 76/126  mediastinal]
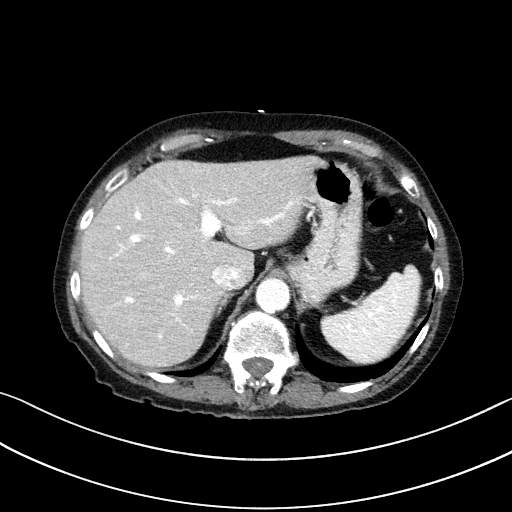
[im 88/126  mediastinal]
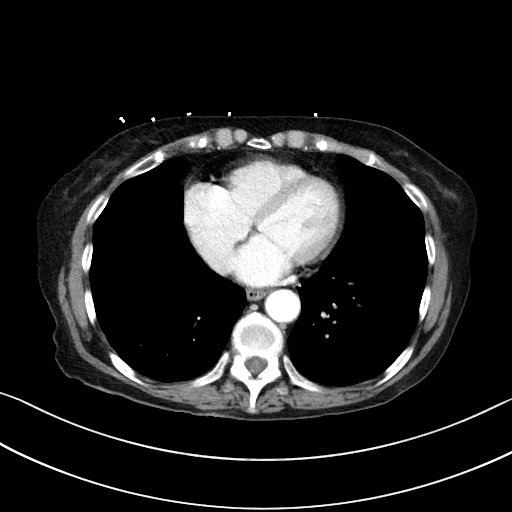
[im 101/126  mediastinal]
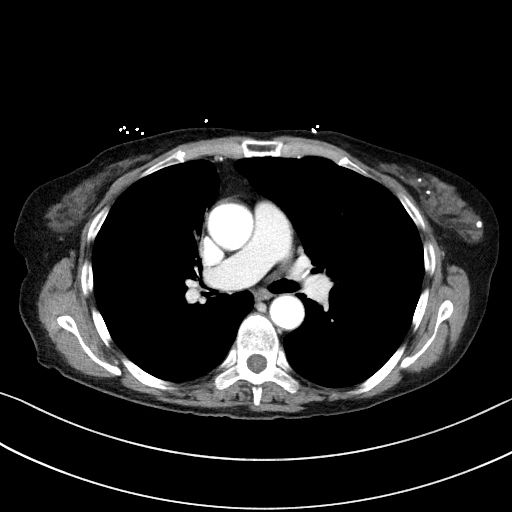
[im 113/126  mediastinal]
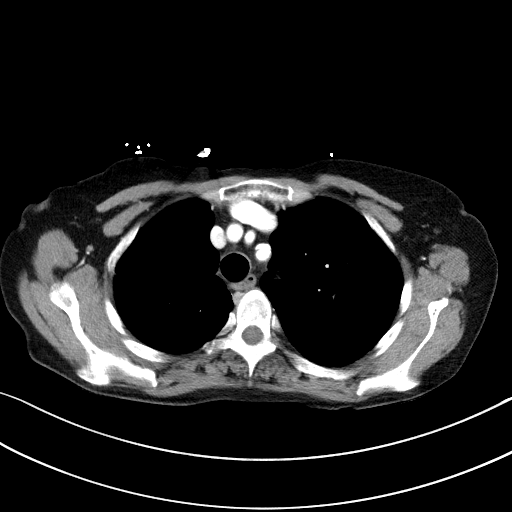
[im 113/126  bone]
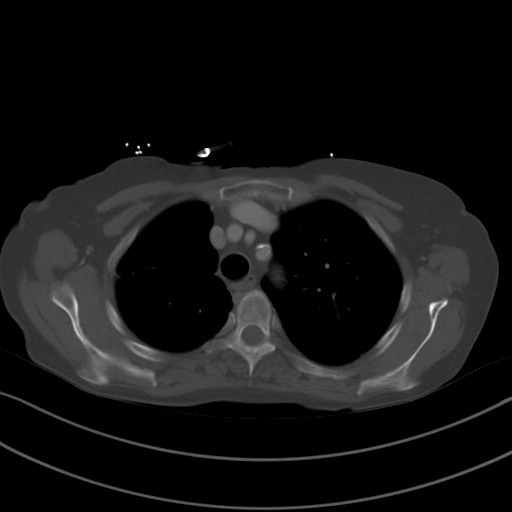

[Series 5: coronals · coronal · 0.62mm/px · 3 of 119 slices shown]
[im 24/119  mediastinal]
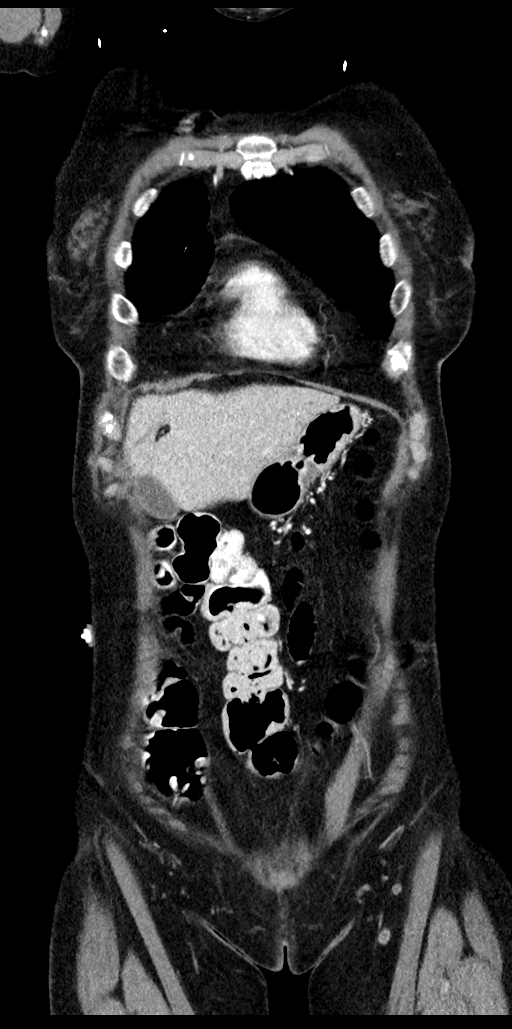
[im 48/119  mediastinal]
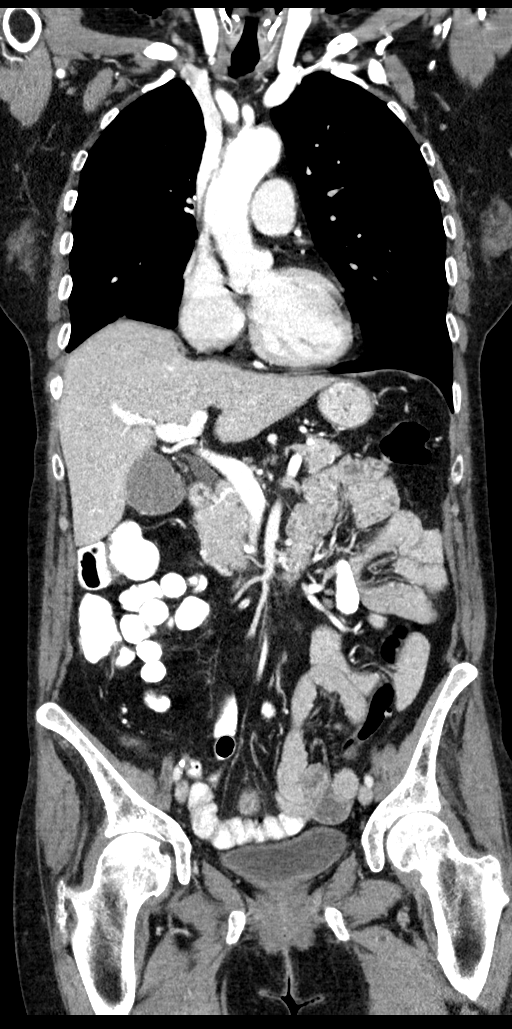
[im 71/119  mediastinal]
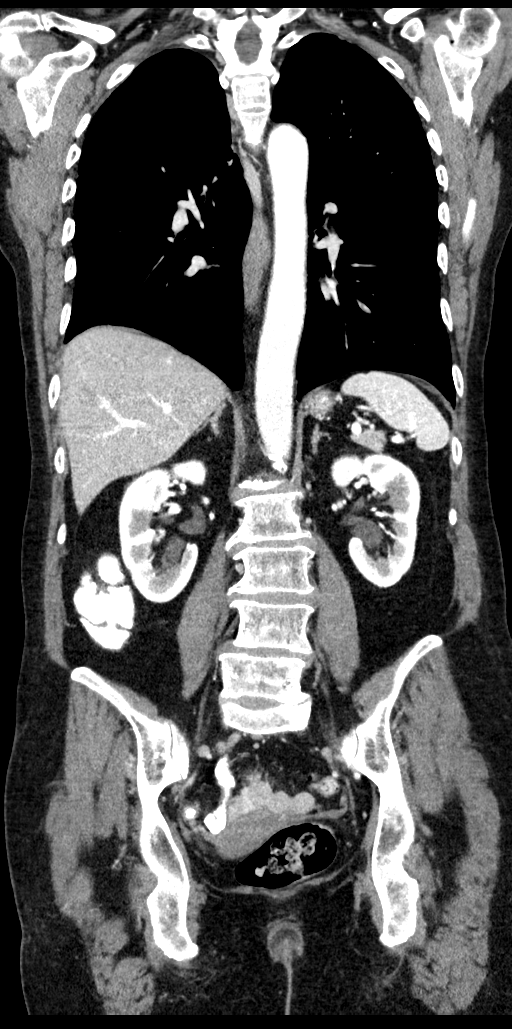

[12 of 36 positions shown; findings below may reference images not displayed]

RADIATION DOSE REDUCTION: This exam was performed according to the
departmental dose-optimization program which includes automated
exposure control, adjustment of the mA and/or kV according to
patient size and/or use of iterative reconstruction technique.

CONTRAST:  80mL OMNIPAQUE IOHEXOL 300 MG/ML  SOLN
FINDINGS: CT CHEST FINDINGS

Cardiovascular: The heart is normal in size. No pericardial
effusion. The aorta is normal in caliber. No dissection. Stable
atherosclerotic calcifications. Stable coronary artery
calcifications.

Mediastinum/Nodes: No mediastinal or hilar mass or lymphadenopathy.
The esophagus is grossly normal.

Lungs/Pleura: Stable surgical changes from a right upper lobe
lobectomy. No findings to suggest recurrent tumor.

Stable underlying emphysematous changes and areas of pulmonary
scarring. No worrisome pulmonary lesions or pulmonary nodules to
suggest metastatic disease.

Musculoskeletal: No obvious breast masses. Bilateral breast macro
calcifications appears stable. No supraclavicular or axillary
adenopathy.

The bony thorax is intact.  No worrisome bone lesions.

CT ABDOMEN PELVIS FINDINGS

Hepatobiliary: Very small low-attenuation lesion in the inferior and
posterior aspect of segment 6 this is unchanged since the PET-CT and
is likely a benign cyst. No worrisome hepatic lesions or
intrahepatic biliary dilatation. Small gallstones are again
demonstrated. No findings for acute cholecystitis. The common bile
duct is within normal limits in caliber for age.

Pancreas: No mass, inflammation or ductal dilatation.

Spleen: Normal size.  No focal lesions.

Adrenals/Urinary Tract: Adrenal glands and kidneys are unremarkable.
No worrisome renal lesions or hydronephrosis. The bladder is
unremarkable.

Stomach/Bowel: The stomach, duodenum, small bowel and colon are
unremarkable. No acute inflammatory changes, mass lesions or
obstructive findings. Descending and sigmoid colon diverticulosis is
stable.

Vascular/Lymphatic: Stable advanced atherosclerotic calcifications
involving the aorta, iliac arteries and branch vessel ostia. No
aneurysm or dissection. The major venous structures are patent. No
mesenteric or retroperitoneal mass or adenopathy.

Reproductive: Stable retroverted uterus. The ovaries are
unremarkable.

Other: No pelvic mass or adenopathy. No free pelvic fluid
collections. No inguinal mass or adenopathy. Small supraumbilical
and infraumbilical abdominal wall hernias containing fat.

Musculoskeletal: No worrisome lytic or sclerotic bone lesions to
suggest metastatic disease.
IMPRESSION: 1. Stable surgical changes from a right upper lobe lobectomy. No
findings to suggest recurrent tumor or metastatic disease involving
the chest, abdomen/pelvis or bony structures.
2. Stable emphysematous changes and pulmonary scarring.
3. Stable advanced atherosclerotic calcifications involving the
thoracic and abdominal aorta and branch vessels including the
coronary arteries.
4. Cholelithiasis.
5. Small supraumbilical and infraumbilical abdominal wall hernias
containing fat.

* Tracking Code: BO *

Aortic Atherosclerosis ([JO]-[JO]) and Emphysema ([JO]-[JO]).

## 2022-01-01 MED ORDER — FLUTICASONE FUROATE-VILANTEROL 200-25 MCG/ACT IN AEPB
1.0000 | INHALATION_SPRAY | Freq: Every day | RESPIRATORY_TRACT | Status: DC
  Filled 2022-01-01: qty 28

## 2022-01-01 MED ORDER — IOHEXOL 9 MG/ML PO SOLN
ORAL | Status: AC
  Filled 2022-01-01: qty 1000

## 2022-01-01 MED ORDER — FLUTICASONE PROPIONATE 50 MCG/ACT NA SUSP
1.0000 | Freq: Every day | NASAL | Status: DC
  Administered 2022-01-01 – 2022-01-03 (×3): 1 via NASAL
  Filled 2022-01-01: qty 16

## 2022-01-01 MED ORDER — GUAIFENESIN ER 600 MG PO TB12: 600.0000 mg | ORAL_TABLET | Freq: Two times a day (BID) | ORAL | Status: DC | PRN

## 2022-01-01 MED ORDER — ALBUTEROL SULFATE (2.5 MG/3ML) 0.083% IN NEBU: 2.5000 mg | INHALATION_SOLUTION | RESPIRATORY_TRACT | Status: DC | PRN

## 2022-01-01 MED ORDER — SODIUM CHLORIDE (PF) 0.9 % IJ SOLN
INTRAMUSCULAR | Status: AC
  Filled 2022-01-01: qty 50

## 2022-01-01 MED ORDER — IOHEXOL 300 MG/ML  SOLN
80.0000 mL | Freq: Once | INTRAMUSCULAR | Status: AC | PRN
  Administered 2022-01-01: 80 mL via INTRAVENOUS

## 2022-01-01 MED ORDER — FLUTICASONE FUROATE-VILANTEROL 200-25 MCG/ACT IN AEPB
1.0000 | INHALATION_SPRAY | Freq: Every day | RESPIRATORY_TRACT | Status: DC
  Administered 2022-01-02 – 2022-01-03 (×2): 1 via RESPIRATORY_TRACT
  Filled 2022-01-01: qty 28

## 2022-01-01 MED ORDER — FLUTICASONE FUROATE-VILANTEROL 200-25 MCG/ACT IN AEPB: 1.0000 | INHALATION_SPRAY | Freq: Every day | RESPIRATORY_TRACT | Status: DC

## 2022-01-01 MED ORDER — IOHEXOL 9 MG/ML PO SOLN
500.0000 mL | ORAL | Status: AC
  Administered 2022-01-01 (×2): 500 mL via ORAL

## 2022-01-01 NOTE — Progress Notes (Signed)
Inpatient Rehab Admissions Coordinator:  ? ?Note plans for [facility] and likely surgery next week.  Will not be ready for CIR until afterwards so will follow remotely for progress with therapy and rehab needs.   ? ?Shann Medal, PT, DPT ?Admissions Coordinator ?[phone] ?01/01/22  ?1:09 PM ? ?

## 2022-01-01 NOTE — Evaluation (Signed)
Occupational Therapy Evaluation ?Patient Details ?Name: [name] ?MRN: [id number] ?DOB: 08-05-1951 ?Today's Date: 01/01/2022 ? ? ?History of Present Illness 71 y.o. female admitted with headaches, weakness and fall. Head CT on 12/30/21 showed a lobulated, nodular and enhancing mass in the posterior right hemisphere center at the right parieto-occipital sulcus is 34 by 40 x 33 mm  with a large volume of right hemisphere vasogenic edema, Associated intracranial mass effect with leftward midline shift of up to 13 mm; pt is now scheduled for SRS followed by curative craniotomy.   PMH: lung cancer dx 2021, s/p robotic assisted right thoracoscopy with right upper lobectomy and lymph node dissection under the care of Dr. Roxan Hockey on October 19, 2020.  hypothyroidism, depression, COPD.  ? ?Clinical Impression ?  ?Info obtained from PT eval. Patient living at home with spouse and was independent up until 3-4 weeks ago, started having falls and needing more assistance. Currently patient presents with sitting and standing balance deficits, decreased coordination and inattention to left UE, especially noted during dressing task. Patient needing verbal cues and assist with threading L arm into hospital gown and did not appear to notice arm was not in the gown. Patient with posterior and left lateral lean in both sitting and standing needing mod A +2 for safety to transfer to bedside commode and total A for clothing management. Currently recommend AIR, currently plan is for surgery 4/28. Acute OT to follow.  ? ?Did not perform this visit but would benefit from visual assessment next visit.   ?   ? ?Recommendations for follow up therapy are one component of a multi-disciplinary discharge planning process, led by the attending physician.  Recommendations may be updated based on patient status, additional functional criteria and insurance authorization.  ? ?Follow Up Recommendations ? Acute inpatient rehab (3hours/day)  ?   ?Assistance Recommended at Discharge Frequent or constant Supervision/Assistance  ?Patient can return home with the following A lot of help with walking and/or transfers;A lot of help with bathing/dressing/bathroom;Assistance with cooking/housework;Direct supervision/assist for medications management;Direct supervision/assist for financial management;Assist for transportation;Help with stairs or ramp for entrance ? ?  ?Functional Status Assessment ? Patient has had a recent decline in their functional status and demonstrates the ability to make significant improvements in function in a reasonable and predictable amount of time.  ?Equipment Recommendations ? BSC/3in1;Tub/shower seat  ?  ?   ?Precautions / Restrictions Precautions ?Precautions: Fall ?Restrictions ?Weight Bearing Restrictions: No  ? ?  ? ?Mobility Bed Mobility ?Overal bed mobility: Needs Assistance ?Bed Mobility: Supine to Sit ?  ?  ?Supine to sit: Mod assist, HOB elevated ?  ?  ?General bed mobility comments: Verbal + tactile cues for sequencing as well as to upright trunk due to posterior and L lateral lean. ?  ? ? ? ?  ?Balance Overall balance assessment: Needs assistance, History of Falls ?Sitting-balance support: Feet supported, Single extremity supported, Bilateral upper extremity supported ?Sitting balance-Leahy Scale: Poor ?Sitting balance - Comments: Initially poor with min to mod A, however with cues patient is able to maintain sitting balance with bilateral upper extremity support and no physical support from therapist. ?Postural control: Posterior lean, Left lateral lean ?Standing balance support: During functional activity, Reliant on assistive device for balance ?Standing balance-Leahy Scale: Poor ?Standing balance comment: Use of rolling walker and mod A from OT ?  ?  ?  ?  ?  ?  ?  ?  ?  ?  ?  ?   ? ?  ADL either performed or assessed with clinical judgement  ? ?ADL Overall ADL's : Needs assistance/impaired ?  ?  ?Grooming: Minimal  assistance;Sitting ?  ?Upper Body Bathing: Moderate assistance;Sitting ?  ?Lower Body Bathing: Maximal assistance;Sitting/lateral leans;Sit to/from stand ?  ?Upper Body Dressing : Moderate assistance;Sitting ?Upper Body Dressing Details (indicate cue type and reason): To don hospital gown, difficulty with coordination L UE needing verbal cues. ?Lower Body Dressing: Total assistance;Sit to/from stand;Sitting/lateral leans ?Lower Body Dressing Details (indicate cue type and reason): To doff soiled mesh underwear and don clean socks due to impaired sitting and standing balance. High fall risk. ?Toilet Transfer: Moderate assistance;Cueing for safety;Cueing for sequencing;Stand-pivot;+2 for safety/equipment;BSC/3in1;Rolling walker (2 wheels) ?Toilet Transfer Details (indicate cue type and reason): RN present to assist with equipment/placement of commode. Patient is high fall risk due to posterior and L lateral lean. Cues throughout for sequencing ?Toileting- Clothing Manipulation and Hygiene: Maximal assistance;Sitting/lateral lean;Sit to/from stand ?Toileting - Clothing Manipulation Details (indicate cue type and reason): Seated on commode patient was able to perform pericare after voiding, total A for clothing management ?  ?  ?Functional mobility during ADLs: Moderate assistance;+2 for safety/equipment;Cueing for sequencing;Cueing for safety;Rolling walker (2 wheels) ?General ADL Comments: Patient is far below her baseline 3-4 weeks ago with balance, safety, coordination and strength deficits.  ? ? ? ? ?Pertinent Vitals/Pain Pain Assessment ?Pain Assessment: No/denies pain  ? ? ? ?   ?Extremity/Trunk Assessment Upper Extremity Assessment ?Upper Extremity Assessment: LUE deficits/detail ?LUE Deficits / Details: Did not formally assess however patient presents with left inattention at times, difficulty noticing L arm is not through her gown. Decreased coordination when trying to thread L UE through gown. Also keeping L  arm close to body with elbow flexed needing cues to extend arm when dressing. ?LUE Coordination: decreased fine motor;decreased gross motor ?  ?Lower Extremity Assessment ?Lower Extremity Assessment: Defer to PT evaluation ?  ?Cervical / Trunk Assessment ?Cervical / Trunk Assessment: Normal ?  ?Communication Communication ?Communication: Expressive difficulties ?  ?Cognition Arousal/Alertness: Awake/alert ?Behavior During Therapy: [facility] for tasks assessed/performed ?Overall Cognitive Status: Impaired/Different from baseline ?Area of Impairment: Attention, Safety/judgement, Awareness, Problem solving ?  ?  ?  ?  ?  ?  ?  ?  ?  ?Current Attention Level: Focused ?  ?  ?Safety/Judgement: Decreased awareness of safety, Decreased awareness of deficits ?Awareness: Intellectual ?Problem Solving: Slow processing, Decreased initiation, Difficulty sequencing, Requires verbal cues, Requires tactile cues ?General Comments: Patient becomes distracted during transfers needing cues to redirect. Verbal cues for sequencing how to turn walker during transfers ?  ?  ?   ?   ?   ? ? ?Home Living Family/patient expects to be discharged to:: Unsure ?Living Arrangements: Spouse/significant other ?Available Help at Discharge: Family ?Type of Home: House ?Home Access: Stairs to enter ?Entrance Stairs-Number of Steps: 1 or 3 ?  ?Home Layout: One level ?  ?  ?  ?  ?  ?  ?  ?Home Equipment: None ?  ?  ?  ? ?  ?Prior Functioning/Environment Prior Level of Function : Independent/Modified Independent ?  ?  ?  ?  ?  ?  ?Mobility Comments: per pt sister pt has been grossly independent until ~ the last 3-4wks. functional decline per pt sister, husband is unable to provide significant physical assist. ?  ?  ? ?  ?  ?OT Problem List: Decreased strength;Decreased range of motion;Decreased activity tolerance;Impaired balance (sitting and/or standing);Decreased coordination;Decreased cognition;Decreased safety awareness;Decreased  knowledge of use of DME or  AE;Impaired UE functional use ?  ?   ?OT Treatment/Interventions: Self-care/ADL training;Therapeutic exercise;Neuromuscular education;DME and/or AE instruction;Therapeutic activities;Cognitive remediation/compe

## 2022-01-01 NOTE — Progress Notes (Signed)
?[facility] Progress Note ? ?Patient: [name]     ?CBS:[id number]  ?DOA: 12/30/2021   ?PCP: [name], PA  ? ?  ?  ?Brief hospital course: ?This is a 71 year old female with non-small cell lung cancer, COPD, hypothyroidism.  She presents to the hospital for being unsteady on her feet and falling.  In the ED,  CT the head revealed a right hemispheric mass with large volume of vasogenic edema compatible with brain metastasis with a leftward midline shift. 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 ? ?Subjective:  ?The patient is confused today. She complains that there was a party last night down the hall and she was not able to sleep. Her sister is at the bedside and state the patient was chewing on her blanket and thinking that it was food. The patient is also forgetting that she is in the hospital and needs reminders from her sister. ? ?Assessment and Plan: ? 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 ?- Decadron started on admission to reduce vasogenic edema ?- Physical therapy has recommended CIR-referral has been placed ? ?Hypothyroidism ?- Continue levothyroxine ? ?Confusion ?- possibly secondary to the brain metastasis vs due to steroids- at this point, we are unable to dc steroids- follow mental status ? ?COPD ?- stable w/o exacerbation ?- cont Advair/Dulera ? ? ?DVT prophylaxis:  SCDs Start: 12/30/21 1255 ? ?  Code Status: Full Code  ?Level of Care: Level of care: Progressive ?Disposition Plan:  ?Status is: Inpatient ?Remains inpatient appropriate because: Continue work-up    ? ?Objective: ?  ?Vitals:  ?  12/31/21 1137 12/31/21 2104 01/01/22 0625 01/01/22 1208  ?BP: (!) 119/51 (!) 115/45 131/86 (!) 106/49  ?Pulse: 61 (!) 57 75 (!) 58  ?Resp: 18 18 16 16   ?Temp: 97.9 ?F (36.6 ?C) 98.4 ?F (36.9 ?C) (!) 97.5 ?F (36.4 ?C) 97.6 ?F (36.4 ?C)  ?TempSrc: Oral Oral Oral Oral  ?SpO2: 98% 98% 100% 100%  ?Weight:      ?Height:      ? ?Filed Weights  ? 12/30/21 1800  ?Weight: 56.8 kg  ? ?Exam: ?General exam: Appears comfortable  ?HEENT: PERRLA, oral mucosa moist, no sclera icterus or thrush ?Respiratory system: Clear to auscultation. Respiratory effort normal. ?Cardiovascular system: S1 & S2 heard, regular rate and rhythm ?Gastrointestinal system: Abdomen soft, non-tender, nondistended. Normal bowel sounds   ?Central nervous system: Alert and oriented x 2. No focal neurological deficits. ?Extremities: No cyanosis, clubbing or edema ?Skin: No rashes or ulcers ?Psychiatry:  Mood & affect appropriate.     ? ?Imaging and lab data was personally reviewed ? ? ? CBC: ?Recent Labs  ?Lab 12/30/21 ?1011 12/31/21 ?0451  ?WBC 6.1 10.9*  ?NEUTROABS 3.9  --   ?HGB 12.2 13.4  ?HCT 37.3 39.5  ?MCV 96.9 94.3  ?PLT 185 226  ? ? ?Basic Metabolic Panel: ?Recent Labs  ?Lab 12/30/21 ?1011 12/31/21 ?0451  ?NA 139 140  ?K 4.7 3.8  ?CL 110 108  ?CO2 26 24  ?GLUCOSE 88 117*  ?BUN 11 12  ?CREATININE 0.43* 0.43*  ?CALCIUM 7.8* 9.3  ?MG 2.2  --   ? ? ?GFR: ?Estimated Creatinine Clearance: 52.6 mL/min (A) (by  C-G formula based on SCr of 0.43 mg/dL (L)). ? ?Scheduled Meds: ? dexamethasone (DECADRON) injection  4 mg Intravenous Q6H  ? iohexol      ? levothyroxine  75 mcg Oral QAC breakfast  ? ?Continuous Infusions: ? ? LOS: 2 days  ? ?Author: ?Debbe Odea  ?01/01/2022 2:57 PM ?   ?

## 2022-01-01 NOTE — Consult Note (Signed)
Reason for Consult:Brain Mets ?Referring Physician:  EDP ? ?[name] is an 71 y.o. female.  ? ?HPI:  ?71  year old female presented to the ED Sunday afternoon with confusion and falls.  She has a history of non-small cell lung cancer, COPD, hypothyroidism.  She does  reports some vision changes.  The symptoms have been happening for the last couple weeks.  States that she stopped chemo 8  months ago for her lung cancer.  She stops smoking 2 years ago.    Husband is older than her and unable to drive. She has a sister that came up from [place] but she is not present today ? ?Past Medical History:  ?Diagnosis Date  ? Cataract   ? COPD (chronic obstructive pulmonary disease) ([place])   ?  ?Past Surgical History:  ?Procedure Laterality Date  ? APPENDECTOMY  2008  ? COLON SURGERY  2008  ? colostomy-infection post op appendectomy  ? COLOSTOMY REVERSAL  2009  ? INTERCOSTAL NERVE BLOCK Right 10/19/2020  ? Procedure: INTERCOSTAL NERVE BLOCK;  Surgeon: [name], MD;  Location: [facility];  Service: Thoracic;  Laterality: Right;  ? NODE DISSECTION Right 10/19/2020  ? Procedure: NODE DISSECTION;  Surgeon: [name], MD;  Location: [facility];  Service: Thoracic;  Laterality: Right;  ? VIDEO BRONCHOSCOPY WITH ENDOBRONCHIAL NAVIGATION N/A 05/08/2020  ? Procedure: VIDEO BRONCHOSCOPY WITH ENDOBRONCHIAL NAVIGATION;  Surgeon: [name], MD;  Location: [facility];  Service: Thoracic;  Laterality: N/A;  ? VIDEO BRONCHOSCOPY WITH ENDOBRONCHIAL ULTRASOUND N/A 05/08/2020  ? Procedure: VIDEO BRONCHOSCOPY WITH ENDOBRONCHIAL ULTRASOUND;  Surgeon: [name], MD;  Location: [facility];  Service: Thoracic;  Laterality: N/A;  ?  ?No Known Allergies  ?Social History  ? ?Tobacco Use  ? Smoking status: Former  ?  Packs/day: 0.05  ?  Years: 50.00  ?  Pack years: 2.50  ?  Types: Cigarettes  ?  Quit date: 09/17/2019  ?  Years since quitting: 2.2  ? Smokeless tobacco: Never  ?Substance Use Topics  ? Alcohol use: Not Currently  ?   Alcohol/week: 10.0 standard drinks  ?  Types: 10 Cans of beer per week  ?  ?Family History  ?Problem Relation Age of Onset  ? Mental illness Mother   ? Colon cancer Mother 19  ? Cancer Father   ? Colon polyps Neg Hx   ? Esophageal cancer Neg Hx   ? Stomach cancer Neg Hx   ? Rectal cancer Neg Hx   ? ?  ?Review of Systems ? ?Positive ROS: as above ? ?All other systems have been reviewed and were otherwise negative with the exception of those mentioned in the HPI and as above. ? ?Objective: ?Vital signs in last 24 hours: ?Temp:  [97.5 ?F (36.4 ?C)-98.4 ?F (36.9 ?C)] 97.5 ?F (36.4 ?C) (04/18 6578) ?Pulse Rate:  [57-75] 75 (04/18 0625) ?Resp:  [16-18] 16 (04/18 4696) ?BP: (115-131)/(45-86) 131/86 (04/18 2952) ?SpO2:  [98 %-100 %] 100 % (04/18 0625) ? ?General Appearance: Alert, cooperative, no distress, appears stated age,   Confused at times,  oriented to self and situation ?Head: Normocephalic, without obvious abnormality, atraumatic ?Eyes: PERRL, conjunctiva/corneas clear, EOM's intact, fundi benign, both eyes      ?Lungs: , respirations unlabored ?Heart: Regular rate and rhythm ?Extremities: Extremities normal, atraumatic, no cyanosis or edema ?Pulses: 2+ and symmetric all extremities ?Skin: Skin color, texture, turgor normal, no rashes or lesions ? ?NEUROLOGIC:  ? ?Mental status: A&O x1,  no aphasia, good attention span, Memory and fund of knowledge ?Motor Exam - grossly normal, normal tone and bulk ?Sensory Exam - grossly normal ?Reflexes: symmetric, no pathologic reflexes, No Hoffman's, No clonus ?Coordination -   grossly normal ?Gait -  not test ?Balance -  not tested ?Cranial Nerves: ?I: smell Not tested  ?II: visual acuity  OS: na    OD: na  ?II: visual fields Full to confrontation,  visual field cut left lower quadrant  ?II: pupils Equal, round, reactive to light  ?III,VII: ptosis None  ?III,IV,VI: extraocular muscles  Full ROM  ?V: mastication Normal  ?V: facial light touch sensation  Normal  ?V,VII: corneal  reflex  Present  ?VII: facial muscle function - upper  Normal  ?VII: facial muscle function - lower Normal  ?VIII: hearing Not tested  ?IX: soft palate elevation  Normal  ?IX,X: gag reflex Present  ?XI: trapezius strength  5/5  ?XI: sternocleidomastoid strength 5/5  ?XI: neck flexion strength  5/5  ?XII: tongue strength  Normal  ? ? ?Data Review ?Lab Results  ?Component Value Date  ? WBC 10.9 (H) 12/31/2021  ? HGB 13.4 12/31/2021  ? HCT 39.5 12/31/2021  ? MCV 94.3 12/31/2021  ? PLT 226 12/31/2021  ? ?Lab Results  ?Component Value Date  ? NA 140 12/31/2021  ? K 3.8 12/31/2021  ? CL 108 12/31/2021  ? CO2 24 12/31/2021  ? BUN 12 12/31/2021  ? CREATININE 0.43 (L) 12/31/2021  ? GLUCOSE 117 (H) 12/31/2021  ? ?Lab Results  ?Component Value Date  ? INR 0.9 10/17/2020  ? ? ?Radiology: MR Brain W Wo Contrast ? ?Result Date: 12/31/2021 ?CLINICAL DATA:  Brain metastases, assess treatment response SRS Protocol for Pre-Op Radiation Treatment planning EXAM: MRI HEAD WITHOUT AND WITH CONTRAST TECHNIQUE: Multiplanar, multiecho pulse sequences of the brain and surrounding structures were obtained without and with intravenous contrast. CONTRAST:  64mL GADAVIST GADOBUTROL 1 MMOL/ML IV SOLN COMPARISON:  12/30/2021 FINDINGS: Brain: Redemonstration of peripherally enhancing right occipital mass with extensive surrounding edema. As before, there is effacement of the right lateral ventricle and leftward midline shift. Ventricles are similar in size. No acute abnormality. 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 Vascular: Major vessel flow voids at the skull base are preserved. Skull and upper cervical spine: Normal marrow signal is preserved. Sinuses/Orbits: Paranasal sinuses are aerated. Bilateral lens replacements. Other: Sella is partially empty.  Mastoid air cells are clear. 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 Stable extensive associated edema with mass effect including leftward midline  shift. No new finding. Electronically Signed   By: Macy Mis M.D.   On: 12/31/2021 14:41  ? ? ? ?Assessment/Plan: ? 71 year old female presented to the ED Sunday afternoon with altered mental status and frequent falls.  CT head showed a mass in the right  occipital lobe with extensive edema mass effect and some midline shift.  Suspected to be  metastatic.   Patient was discussed on tumor board.  The plan is for her to get 3 sessions of SRS and then a craniotomy for resection of tumor next Friday.  At a discussion with the patient about her plan of care.  She was under the understanding that she was just getting radiation. The patient is oriented at times and then confused at other times.  It is difficult to know whether she fully understands our discussion today.  She seems to be on the fence about wanting a resection of this  brain tumor.  I would like to discuss this with her family as well so that we are all on the same page.  I do think she needs further metastatic workup to know the extension of this metastatic disease.  I did order a CT chest abdomen and pelvis. ? ? ?Ocie Cornfield Donold Marotto ?01/01/2022 8:40 AM ? ?  ?

## 2022-01-02 DIAGNOSIS — G9340 Encephalopathy, unspecified: Secondary | ICD-10-CM

## 2022-01-02 DIAGNOSIS — C7931 Secondary malignant neoplasm of brain: Secondary | ICD-10-CM | POA: Diagnosis not present

## 2022-01-02 NOTE — Progress Notes (Signed)
Physical Therapy Treatment ?Patient Details ?Name: [name] ?MRN: [id number] ?DOB: 1951-06-10 ?Today's Date: 01/02/2022 ? ? ?History of Present Illness 71 y.o. female admitted with headaches, weakness and fall. Head CT on 12/30/21 showed a lobulated, nodular and enhancing mass in the posterior right hemisphere center at the right parieto-occipital sulcus is 34 by 40 x 33 mm  with a large volume of right hemisphere vasogenic edema, Associated intracranial mass effect with leftward midline shift of up to 13 mm; pt is now scheduled for SRS followed by curative craniotomy.   PMH: lung cancer dx 2021, s/p robotic assisted right thoracoscopy with right upper lobectomy and lymph node dissection under the care of Dr. Roxan Hockey on October 19, 2020.  hypothyroidism, depression, COPD. ? ?  ?PT Comments  ? ? Pt seated in recliner at entry and alert and oriented x3, though she did report confusion about a particular medication that she then later stated "I must have dreamed that." Pt required min guard assist for transfers and min assist for ambulation with RW in hallway, +2 for safety only. Should pt demonstrate similar capabilities with functional mobility and maintain current level of cognition she may only require +1 for assistance for future sessions. Pt continues to demonstrate decreased attention to L side, poor motor planning, and decreased balance. Current discharge destination remains appropriate given pt's medical status and planned procedures. Pt will benefit from skilled PT to increase their independence and safety with mobility to allow discharge to the venue listed below.  ? ? ?   ?Recommendations for follow up therapy are one component of a multi-disciplinary discharge planning process, led by the attending physician.  Recommendations may be updated based on patient status, additional functional criteria and insurance authorization. ? ?Follow Up Recommendations ? Acute inpatient rehab (3hours/day) ?  ?   ?Assistance Recommended at Discharge Frequent or constant Supervision/Assistance  ?Patient can return home with the following A lot of help with walking and/or transfers;A lot of help with bathing/dressing/bathroom;Assist for transportation;Direct supervision/assist for financial management;Help with stairs or ramp for entrance;Direct supervision/assist for medications management;Assistance with cooking/housework ?  ?Equipment Recommendations ? Other (comment) (TBD)  ?  ?Recommendations for Other Services   ? ? ?  ?Precautions / Restrictions Precautions ?Precautions: Fall ?Restrictions ?Weight Bearing Restrictions: No  ?  ? ?Mobility ? Bed Mobility ?  ?  ?  ?  ?  ?  ?  ?General bed mobility comments: Pt in recliner at entry and exit ?  ? ?Transfers ?Overall transfer level: Needs assistance ?Equipment used: Rolling walker (2 wheels) ?Transfers: Sit to/from Stand, Bed to chair/wheelchair/BSC ?Sit to Stand: Min guard, +2 safety/equipment ?  ?Step pivot transfers: Min guard, +2 safety/equipment ?  ?  ?  ?General transfer comment: Pt min guard with +2 for safety, no physical assist required. Multi-modal cues for hand placement and task sequence.  Stand pivot recliner to [facility] with RW and min guard assist +2 for safety verbal cues for sequencing, attention to L side and foot position. ?  ? ?Ambulation/Gait ?Ambulation/Gait assistance: +2 safety/equipment, Min assist, Min guard ?Gait Distance (Feet): 60 Feet ?Assistive device: Rolling walker (2 wheels) ?Gait Pattern/deviations: Step-through pattern, Drifts right/left, Trunk flexed ?Gait velocity: decreased ?  ?  ?General Gait Details: Pt ambulated with RW with min guard assist +2 for recliner follow for safety, no overt LOB and VSS. Demonstrated strong left drift and inattention and required near constant cuing and/or min assist for adjustment of RW path to avoid obstacles. Pt can  correct direction with cuing but continues to demonstrate some L inattention. ? ? ?Stairs ?  ?   ?  ?  ?  ? ? ?Wheelchair Mobility ?  ? ?Modified Rankin (Stroke Patients Only) ?  ? ? ?  ?Balance Overall balance assessment: Needs assistance, History of Falls ?Sitting-balance support: Feet supported ?Sitting balance-Leahy Scale: Fair ?Sitting balance - Comments: Pt sitting in recliner with feet supported without BUE support ?Postural control: Left lateral lean ?Standing balance support: During functional activity, Reliant on assistive device for balance, Bilateral upper extremity supported ?Standing balance-Leahy Scale: Poor ?  ?  ?  ?  ?  ?  ?  ?  ?  ?  ?  ?  ?  ? ?  ?Cognition Arousal/Alertness: Awake/alert ?Behavior During Therapy: [facility] for tasks assessed/performed ?Overall Cognitive Status: Impaired/Different from baseline ?Area of Impairment: Attention, Safety/judgement, Awareness, Problem solving ?  ?  ?  ?  ?  ?  ?  ?  ?Orientation Level:  (Pt A&O to self, place, time, and situation.) ?Current Attention Level: Focused ?Memory: Decreased short-term memory ?Following Commands: Follows one step commands with increased time, Follows multi-step commands inconsistently ?Safety/Judgement: Decreased awareness of safety, Decreased awareness of deficits ?Awareness: Emergent ?Problem Solving: Slow processing, Decreased initiation, Difficulty sequencing, Requires verbal cues ?General Comments: Patient becomes distracted during transfers needing cues to redirect. Verbal cues for sequencing how to turn walker during transfers. Pt reporting confusion about the medications she is taking and stated "well maybe I just dreamed it." ?  ?  ? ?  ?Exercises   ? ?  ?General Comments   ?  ?  ? ?Pertinent Vitals/Pain Pain Assessment ?Pain Assessment: No/denies pain  ? ? ?Home Living   ?  ?  ?  ?  ?  ?  ?  ?  ?  ?   ?  ?Prior Function    ?  ?  ?   ? ?PT Goals (current goals can now be found in the care plan section) Acute Rehab PT Goals ?PT Goal Formulation: Patient unable to participate in goal setting ?Time For Goal Achievement:  01/14/22 ?Potential to Achieve Goals: Good ?Progress towards PT goals: Progressing toward goals ? ?  ?Frequency ? ? ? Min 3X/week ? ? ? ?  ?PT Plan Current plan remains appropriate  ? ? ?Co-evaluation   ?  ?  ?  ?  ? ?  ?AM-PAC PT "6 Clicks" Mobility   ?Outcome Measure ? Help needed turning from your back to your side while in a flat bed without using bedrails?: A Little ?Help needed moving from lying on your back to sitting on the side of a flat bed without using bedrails?: A Little ?Help needed moving to and from a bed to a chair (including a wheelchair)?: A Little ?Help needed standing up from a chair using your arms (e.g., wheelchair or bedside chair)?: A Little ?Help needed to walk in hospital room?: A Lot ?Help needed climbing 3-5 steps with a railing? : Total ?6 Click Score: 15 ? ?  ?End of Session Equipment Utilized During Treatment: Gait belt ?Activity Tolerance: Patient tolerated treatment well ?Patient left: in chair;with call bell/phone within reach;with chair alarm set;with family/visitor present ?Nurse Communication: Mobility status ?PT Visit Diagnosis: Other abnormalities of gait and mobility (R26.89);Difficulty in walking, not elsewhere classified (R26.2);Other symptoms and signs involving the nervous system (R29.898) ?  ? ? ?Time: 1000-1021 ?PT Time Calculation (min) (ACUTE ONLY): 21 min ? ?Charges:  $Gait Training:  8-22 mins          ?          ? ?Coolidge Breeze, PT, DPT ?[facility] ?Office: [phone] ?Pager: [phone] ? ? ?Coolidge Breeze ?01/02/2022, 11:18 AM ? ?

## 2022-01-02 NOTE — Assessment & Plan Note (Signed)
Stage IV small cell lung cancer with metastasis to brain ?She has been on maintenance immunotherapy with atezolizumab every 3 weeks. ?Radiation oncology and neurosurgery recommending 3 treatments of preop stereotactic radiation followed by craniotomy for resection.  Surgery planning for Friday next week. ?-I discussed with Dr. Tammi Klippel, patient can have radiation treatment outpatient. ?-Continue  with IV Decadron 4 mg every 6 hours.  At discharge she will need 4 mg orally every 6 hours ? ?

## 2022-01-02 NOTE — Assessment & Plan Note (Signed)
-   Not currently on medications

## 2022-01-02 NOTE — Assessment & Plan Note (Addendum)
-   Stable, remain without acute exacerbation. ?-on Advair/ at home. Currently on Breo/ellipta.  ?

## 2022-01-02 NOTE — Progress Notes (Signed)
?  Progress Note ? ? ?Patient: [name] KZL:[id number] DOB: June 24, 1951 DOA: 12/30/2021     3 ?DOS: the patient was seen and examined on 01/02/2022 ?  ?Brief hospital course: ?71 year old with past medical history significant for non-small cell lung cancer, COPD, hypothyroidism, who presents to the hospital for being unsteady on her feet and falling.  Subsequent evaluation with  a CT head revealed right hemispheric mass with large volume of vasogenic edema compatible with brain metastasis with a leftward midline shift.  ? ?Radiation oncology, neurosurgery, medical oncology consulted.  Plan is for preoperative history to take radiation treatment in 3 fractions followed by craniotomy for resection.  ? ?Assessment and Plan: ?* Malignant neoplasm metastatic to brain [facility]) ?Stage IV small cell lung cancer with metastasis to brain ?She has been on maintenance immunotherapy with atezolizumab every 3 weeks. ?Radiation oncology and neurosurgery recommending 3 treatments of preop stereotactic radiation followed by craniotomy for resection.  Surgery planning for Friday next week. ?-I discussed with Dr. Tammi Klippel, patient can have radiation treatment outpatient. ?-Continue  with IV Decadron 4 mg every 6 hours.  At discharge she will need 4 mg orally every 6 hours ? ? ?Acute encephalopathy ?Suspect related to brain mets.  Patient confused. ? ?Depression ?Not currently on medications.  ? ?Hypocalcemia ?Resolved./  ? ?Hypothyroidism ?Continue with Synthroid.  ? ?Falls ?In setting of brain mets.  ?PT following.  ? 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 ?-on Advair/ at home. Currently on Breo/ellipta.  ? ? ? ? ?  ? ?Subjective: patient is alert, she denies pain. She is aware she needs radiation treatment. Husband at bedside, aware of plan for radiation and Sx.  ? ?Physical Exam: ?Vitals:  ? 01/01/22 2028 01/02/22 0439 01/02/22 0826 01/02/22 1315  ?BP: 107/65 (!) 108/59  (!) 95/59  ?Pulse: (!) 58 60  62   ?Resp: 20 18    ?Temp: 98.3 ?F (36.8 ?C) 97.9 ?F (36.6 ?C)  97.7 ?F (36.5 ?C)  ?TempSrc: Oral   Oral  ?SpO2: 97% 98% 96% 96%  ?Weight:      ?Height:      ? ?General; NAD ?CVS; S 1, S 2 RRR ?Lungs; CTA ? ? ?Data Reviewed: ? ?No recent labs.  ? ?Family Communication: Husband at bedside.  ? ?Disposition: ?Status is: Inpatient ?Remains inpatient appropriate because: requiring IV steroids.  ? Planned Discharge Destination:  to be determine. 35 ? ? ? ?Time spent: 35 minutes ? ?Author: ?Elmarie Shiley, MD ?01/02/2022 2:04 PM ? ?For on call review [email or web address].  ?

## 2022-01-02 NOTE — Hospital Course (Signed)
71 year old with past medical history significant for non-small cell lung cancer, COPD, hypothyroidism, who presents to the hospital for being unsteady on her feet and falling.  Subsequent evaluation with  a CT head revealed right hemispheric mass with large volume of vasogenic edema compatible with brain metastasis with a leftward midline shift.  ? ?Radiation oncology, neurosurgery, medical oncology consulted.  Plan is for preoperative history to take radiation treatment in 3 fractions followed by craniotomy for resection.  ?

## 2022-01-02 NOTE — Assessment & Plan Note (Signed)
In setting of brain mets.  ?PT following.  ?

## 2022-01-02 NOTE — Assessment & Plan Note (Signed)
Resolved

## 2022-01-02 NOTE — Assessment & Plan Note (Signed)
Suspect related to brain mets.  Patient confused. ?

## 2022-01-02 NOTE — Progress Notes (Incomplete)
PMR Admission Coordinator Pre-Admission Assessment ?  ?Patient: [name] is an 71 y.o., female ?MRN: [id number] ?DOB: 27-Oct-1950 ?Height: 5\' 3"  (160 cm) ?Weight: 56.8 kg ?  ?Insurance Information ?HMO: ***    PPO: ***     PCP:      IPA:      80/20:      OTHER:  ?PRIMARY: Blue Medicare      Policy#: [id number]      Subscriber: pt ?CM Name: Larene Beach      Phone#: [phone]     Fax#: [phone] ?Pre-Cert#: [id number]      Employer:  ?Benefits:  Phone #: [phone]     Name:  ?Eff. Date: ***     Deduct: ***      Out of Pocket Max: ***      Life Max: n/a ?CIR: ***      SNF: *** ?Outpatient: ***     Co-Pay: *** ?Home Health: ***      Co-Pay: *** ?DME: ***     Co-Pay: *** ?Providers: *** ?SECONDARY:       Policy#:      Phone#:  ?  ?Financial Counselor:       Phone#:  ?  ?The ?Data Collection Information Summary? for patients in Inpatient Rehabilitation Facilities with attached ?Privacy Act [place] Records? was provided and verbally reviewed with: Patient and Family ?  ?Emergency Contact Information ?Contact Information   ?  ?  Name Relation Home Work Mobile  ?  Dement,CHARLES Spouse [phone]   [phone]  ?  sister,Lucy Sister     [phone]  ?  ?   ?  ?  ?Current Medical History  ?Patient Admitting Diagnosis: brain mets ?  ?History of Present Illness: Pt is a 71 y/o female with PMH of COPD, hypothyroidism, and NSCLC who presents to [facility] on 4/16 with several week history of unsteadiness and falls, as well as headaches that were not responsive to OTC medication.  She has been on maintenance immunotherapy with atezolzumab every 3 weeks.  ED workup revealed labs and vitals WNL.  CT head revealed large 4 cm engancing intra-axial mass in the posterior right hemisphere (parietoccipital sulcus) with a large volume of hemispheric vasogenic edema, most compatible with brain metastasis, intracranial mass effect with leftward midline shift up to 66mm, trapped left lateral ventricle, and effaced suprasellar  cistern.  Neurosurgery, oncology, and rad-onc consulted.  Recommended to start decadron protocol, radiation treatment in 3 fractions followed by craniotomy for resection.  Pt with intermittent confusion.  Tolerating regular diet.  Therapy ongoing and recommendations are for CIR to maximize independence prior to surgery, then return to CIR to reduce caregiver burden and maximize functional independence with planned return home with family support.   ?  ?[facility]'s [facility] record [facility] has been reviewed by the rehabilitation admission coordinator and physician. ?  ?Past Medical History  ?    ?Past Medical History:  ?Diagnosis Date  ? Cataract    ? COPD (chronic obstructive pulmonary disease) ([place])    ?  ?  ?Has the patient had major surgery during 100 days prior to admission? No and plan for surgery 4/28 ?  ?Family History   ?family history includes Cancer in her father; Colon cancer (age of onset: 33) in her mother; Mental illness in her mother. ?  ?Current Medications ?  ?Current Facility-Administered Medications:  ?  albuterol (PROVENTIL) (2.5 MG/3ML) 0.083% nebulizer solution 2.5 mg, 2.5 mg, Inhalation, Q4H PRN, Debbe Odea, MD ?  dexamethasone (DECADRON) injection 4 mg, 4 mg, Intravenous, Q6H, Kyle, Tyrone A, DO, 4 mg at 01/02/22 1200 ?  fluticasone (FLONASE) 50 MCG/ACT nasal spray 1 spray, 1 spray, Each Nare, Daily, Debbe Odea, MD, 1 spray at 01/02/22 0927 ?  fluticasone furoate-vilanterol (BREO ELLIPTA) 200-25 MCG/ACT 1 puff, 1 puff, Inhalation, Daily, Debbe Odea, MD, 1 puff at 01/02/22 0824 ?  guaiFENesin (MUCINEX) 12 hr tablet 600 mg, 600 mg, Oral, BID PRN, Debbe Odea, MD ?  levothyroxine (SYNTHROID) tablet 75 mcg, 75 mcg, Oral, QAC breakfast, Debbe Odea, MD, 75 mcg at 01/02/22 0456 ?  LORazepam (ATIVAN) injection 1 mg, 1 mg, Intravenous, Q6H PRN, Marylyn Ishihara, Tyrone A, DO ?  morphine (PF) 2 MG/ML injection 2 mg, 2 mg, Intravenous, Q4H PRN, Marylyn Ishihara, Tyrone A, DO ?  oxyCODONE (Oxy IR/ROXICODONE)  immediate release tablet 5 mg, 5 mg, Oral, Q4H PRN, Marylyn Ishihara, Tyrone A, DO ?  prochlorperazine (COMPAZINE) injection 10 mg, 10 mg, Intravenous, Q6H PRN, Marylyn Ishihara, Tyrone A, DO ?  ?Patients Current Diet:  ?Diet Order   ?  ?         ?    Diet Heart Room service appropriate? Yes; Fluid consistency: Thin  Diet effective now       ?  ?  ?   ?  ?  ?   ?  ?  ?Precautions / Restrictions ?Precautions ?Precautions: Fall ?Restrictions ?Weight Bearing Restrictions: No  ?  ?Has the patient had 2 or more falls or a fall with injury in the past year? Yes ?  ?Prior Activity Level ?Limited Community (1-2x/wk): prior to 3 weeks ago was independent with mobility and ADLs ?  ?Prior Functional Level ?Self Care: Did the patient need help bathing, dressing, using the toilet or eating? Independent ?  ?Indoor Mobility: Did the patient need assistance with walking from room to room (with or without device)? Independent ?  ?Stairs: Did the patient need assistance with internal or external stairs (with or without device)? Independent ?  ?Functional Cognition: Did the patient need help planning regular tasks such as shopping or remembering to take medications? Independent ?  ?Patient Information ?Are you of Hispanic, Latino/a,or Spanish origin?: A. No, not of Hispanic, Latino/a, or Spanish origin ?What is your race?: A. White ?Do you need or want an interpreter to communicate with a doctor or health care staff?: 0. No ?  ?Patient's Response To:  ?Health Literacy and Transportation ?Is the patient able to respond to health literacy and transportation needs?: Yes ?Health Literacy - How often do you need to have someone help you when you read instructions, pamphlets, or other written material from your doctor or pharmacy?: Sometimes ?In the past 12 months, has lack of transportation kept you from medical appointments or from getting medications?: No ?In the past 12 months, has lack of transportation kept you from meetings, work, or from getting things  needed for daily living?: No ?  ?Home Assistive Devices / Equipment ?Home Assistive Devices/Equipment: None ?Home Equipment: None ?  ?Prior Device Use: Indicate devices/aids used by the patient prior to current illness, exacerbation or injury? None of the above ?  ?Current Functional Level ?Cognition ?  Overall Cognitive Status: Impaired/Different from baseline ?Current Attention Level: Focused ?Orientation Level: Oriented X4 ?Following Commands: Follows one step commands with increased time, Follows multi-step commands inconsistently ?Safety/Judgement: Decreased awareness of safety, Decreased awareness of deficits ?General Comments: Patient becomes distracted during transfers needing cues to redirect. Verbal cues for sequencing how to turn walker during  transfers. Pt reporting confusion about the medications she is taking and stated "well maybe I just dreamed it." ?   ?Extremity Assessment ?(includes Sensation/Coordination) ?  Upper Extremity Assessment: LUE deficits/detail ?LUE Deficits / Details: Did not formally assess however patient presents with left inattention at times, difficulty noticing L arm is not through her gown. Decreased coordination when trying to thread L UE through gown. Also keeping L arm close to body with elbow flexed needing cues to extend arm when dressing. ?LUE Sensation: decreased light touch ?LUE Coordination: decreased fine motor, decreased gross motor  ?Lower Extremity Assessment: Defer to PT evaluation ?RLE Deficits / Details: grossly 3+/5, AROM WFL ?LLE Deficits / Details: AAROM grossly WFL, strength 2+ to 3/5, cognition limits testing  ?   ?ADLs ?  Overall ADL's : Needs assistance/impaired ?Grooming: Minimal assistance, Sitting ?Upper Body Bathing: Moderate assistance, Sitting ?Lower Body Bathing: Maximal assistance, Sitting/lateral leans, Sit to/from stand ?Upper Body Dressing : Moderate assistance, Sitting ?Upper Body Dressing Details (indicate cue type and reason): To don  hospital gown, difficulty with coordination L UE needing verbal cues. ?Lower Body Dressing: Total assistance, Sit to/from stand, Sitting/lateral leans ?Lower Body Dressing Details (indicate cue type and reason):

## 2022-01-02 NOTE — PMR Pre-admission (Signed)
PMR Admission Coordinator Pre-Admission Assessment ? ?Patient: [name] is an 71 y.o., female ?MRN: [id number] ?DOB: [birth year] ?Height: 5' 3"  (160 cm) ?Weight: 56.8 kg ? ?Insurance Information ?HMO: yes    PPO:      PCP:      IPA:      80/20:      OTHER:  ?PRIMARY: Blue Medicare      Policy#: [id number]      Subscriber: pt ?CM Name: Larene Beach      Phone#: [phone]     Fax#: [phone] ?Pre-Cert#: tbd      Employer:  ?Benefits:  Phone #: [phone]     Name:  ?Eff. Date: 09/16/21     Deduct: $0      Out of Pocket Max: $3700 (met)      Life Max: n/a ?CIR: $355/day for days 1-6      SNF: 20 full days ?Outpatient:      Co-Pay: $10/visit ?Home Health: 100%      Co-Pay:  ?DME: 80%     Co-Ins: 20% ?Providers:  ?SECONDARY:       Policy#:      Phone#:  ? ?Financial Counselor:       Phone#:  ? ?The ?Data Collection Information Summary? for patients in Inpatient Rehabilitation Facilities with attached ?Privacy Act [place] Records? was provided and verbally reviewed with: Patient and Family ? ?Emergency Contact Information ?Contact Information   ? ? Name Relation Home Work Mobile  ? Okerlund,CHARLES Spouse [phone]  [phone]  ? sister,Lucy Sister   [phone]  ? ?  ? ? ?Current Medical History  ?Patient Admitting Diagnosis: brain mets ? ?History of Present Illness: Pt is a 71 y/o female with PMH of COPD, hypothyroidism, and NSCLC who presents to [facility] on 4/16 with several week history of unsteadiness and falls, as well as headaches that were not responsive to OTC medication.  She has been on maintenance immunotherapy with atezolzumab every 3 weeks.  ED workup revealed labs and vitals WNL.  CT head revealed large 4 cm engancing intra-axial mass in the posterior right hemisphere (parietoccipital sulcus) with a large volume of hemispheric vasogenic edema, most compatible with brain metastasis, intracranial mass effect with leftward midline shift up to 70m, trapped left lateral ventricle, and  effaced suprasellar cistern.  Neurosurgery, oncology, and rad-onc consulted.  Recommended to start decadron protocol, radiation treatment in 3 fractions followed by craniotomy for resection.  Pt with intermittent confusion.  Tolerating regular diet.  Therapy ongoing and recommendations are for CIR to maximize independence prior to surgery, then return to CIR to reduce caregiver burden and maximize functional independence with planned return home with family support.   ?  ? ?[facility] admission coordinator and physician. ? ?Past Medical History  ?Past Medical History:  ?Diagnosis Date  ? Cataract   ? [place]   ? ? ?Has the patient had major surgery during 100 days prior to admission? No and plan for surgery 4/28 ? ?Family History   ?family history includes Cancer in her father; Colon cancer (age of onset: 739 in her mother; Mental illness in her mother. ? ?Current Medications ? ?Current Facility-Administered Medications:  ?  albuterol (PROVENTIL) (2.5 MG/3ML) 0.083% nebulizer solution 2.5 mg, 2.5 mg, Inhalation, Q4H PRN, RDebbe Odea MD ?  dexamethasone (DECADRON) injection 4 mg, 4 mg, Intravenous, Q6H, Kyle, Tyrone A, DO, 4 mg at 01/03/22 1129 ?  fluticasone (  FLONASE) 50 MCG/ACT nasal spray 1 spray, 1 spray, Each Nare, Daily, Debbe Odea, MD, 1 spray at 01/03/22 0901 ?  fluticasone furoate-vilanterol (BREO ELLIPTA) 200-25 MCG/ACT 1 puff, 1 puff, Inhalation, Daily, Debbe Odea, MD, 1 puff at 01/03/22 0903 ?  guaiFENesin (MUCINEX) 12 hr tablet 600 mg, 600 mg, Oral, BID PRN, Debbe Odea, MD ?  levothyroxine (SYNTHROID) tablet 75 mcg, 75 mcg, Oral, QAC breakfast, Debbe Odea, MD, 75 mcg at 01/03/22 0506 ?  LORazepam (ATIVAN) injection 1 mg, 1 mg, Intravenous, Q6H PRN, Marylyn Ishihara, Tyrone A, DO ?  morphine (PF) 2 MG/ML injection 2 mg, 2 mg, Intravenous, Q4H PRN, Marylyn Ishihara, Tyrone A, DO ?  oxyCODONE (Oxy  IR/ROXICODONE) immediate release tablet 5 mg, 5 mg, Oral, Q4H PRN, Marylyn Ishihara, Tyrone A, DO ?  prochlorperazine (COMPAZINE) injection 10 mg, 10 mg, Intravenous, Q6H PRN, Marylyn Ishihara, Tyrone A, DO ? ?Patients Current Diet:  ?Diet Order   ? ?       ?  Diet - low sodium heart healthy       ?  ?  Diet Heart Room service appropriate? Yes; Fluid consistency: Thin  Diet effective now       ?  ? ?  ?  ? ?  ? ? ?Precautions / Restrictions ?Precautions ?Precautions: Fall ?Restrictions ?Weight Bearing Restrictions: No  ? ?Has the patient had 2 or more falls or a fall with injury in the past year? Yes ? ?Prior Activity Level ?Limited Community (1-2x/wk): prior to 3 weeks ago was independent with mobility and ADLs ? ?Prior Functional Level ?Self Care: Did the patient need help bathing, dressing, using the toilet or eating? Independent ? ?Indoor Mobility: Did the patient need assistance with walking from room to room (with or without device)? Independent ? ?Stairs: Did the patient need assistance with internal or external stairs (with or without device)? Independent ? ?Functional Cognition: Did the patient need help planning regular tasks such as shopping or remembering to take medications? Independent ? ?Patient Information ?Are you of Hispanic, Latino/a,or Spanish origin?: A. No, not of Hispanic, Latino/a, or Spanish origin ?What is your race?: A. White ?Do you need or want an interpreter to communicate with a doctor or health care staff?: 0. No ? ?Patient's Response To:  ?Health Literacy and Transportation ?Is the patient able to respond to health literacy and transportation needs?: Yes ?Health Literacy - How often do you need to have someone help you when you read instructions, pamphlets, or other written material from your doctor or pharmacy?: Sometimes ?In the past 12 months, has lack of transportation kept you from medical appointments or from getting medications?: No ?In the past 12 months, has lack of transportation kept you from  meetings, work, or from getting things needed for daily living?: No ? ?Home Assistive Devices / Equipment ?Home Assistive Devices/Equipment: None ?Home Equipment: None ? ?Prior Device Use: Indicate devices/aids used by the patient prior to current illness, exacerbation or injury? None of the above ? ?Current Functional Level ?Cognition ? Overall Cognitive Status: Impaired/Different from baseline ?Current Attention Level: Focused ?Orientation Level: Oriented X4 ?Following Commands: Follows one step commands with increased time, Follows multi-step commands inconsistently ?Safety/Judgement: Decreased awareness of safety, Decreased awareness of deficits ?General Comments: Patient becomes distracted during transfers needing cues to redirect. Verbal cues for sequencing how to turn walker during transfers. Pt reporting confusion about the medications she is taking and stated "well maybe I just dreamed it." ?   ?Extremity Assessment ?(includes Sensation/Coordination) ? Upper Extremity Assessment:  LUE deficits/detail ?LUE Deficits / Details: Did not formally assess however patient presents with left inattention at times, difficulty noticing L arm is not through her gown. Decreased coordination when trying to thread L UE through gown. Also keeping L arm close to body with elbow flexed needing cues to extend arm when dressing. ?LUE Sensation: decreased light touch ?LUE Coordination: decreased fine motor, decreased gross motor  ?Lower Extremity Assessment: Defer to PT evaluation ?RLE Deficits / Details: grossly 3+/5, AROM WFL ?LLE Deficits / Details: AAROM grossly WFL, strength 2+ to 3/5, cognition limits testing  ?  ?ADLs ? Overall ADL's : Needs assistance/impaired ?Grooming: Minimal assistance, Sitting ?Upper Body Bathing: Moderate assistance, Sitting ?Lower Body Bathing: Maximal assistance, Sitting/lateral leans, Sit to/from stand ?Upper Body Dressing : Moderate assistance, Sitting ?Upper Body Dressing Details (indicate cue  type and reason): To don hospital gown, difficulty with coordination L UE needing verbal cues. ?Lower Body Dressing: Total assistance, Sit to/from stand, Sitting/lateral leans ?Lower Body Dressing Details (indicate

## 2022-01-02 NOTE — Plan of Care (Signed)
?  Problem: Health Behavior/Discharge Planning: Goal: Ability to manage health-related needs will improve Outcome: Progressing   Problem: Clinical Measurements: Goal: Ability to maintain clinical measurements within normal limits will improve Outcome: Progressing Goal: Will remain free from infection Outcome: Progressing Goal: Diagnostic test results will improve Outcome: Progressing Goal: Respiratory complications will improve Outcome: Progressing Goal: Cardiovascular complication will be avoided Outcome: Progressing   Problem: Activity: Goal: Risk for activity intolerance will decrease Outcome: Progressing   Problem: Nutrition: Goal: Adequate nutrition will be maintained Outcome: Progressing   Problem: Coping: Goal: Level of anxiety will decrease Outcome: Progressing   Problem: Elimination: Goal: Will not experience complications related to bowel motility Outcome: Progressing   Problem: Pain Managment: Goal: General experience of comfort will improve Outcome: Progressing   Problem: Safety: Goal: Ability to remain free from injury will improve Outcome: Progressing   Problem: Skin Integrity: Goal: Risk for impaired skin integrity will decrease Outcome: Progressing   Problem: Education: Goal: Knowledge of General Education information will improve Description: Including pain rating scale, medication(s)/side effects and non-pharmacologic comfort measures Outcome: Completed/Met   Problem: Elimination: Goal: Will not experience complications related to urinary retention Outcome: Completed/Met   

## 2022-01-02 NOTE — Assessment & Plan Note (Signed)
Continue with Synthroid 

## 2022-01-02 NOTE — Progress Notes (Signed)
Inpatient Rehab Admissions Coordinator:  ? ?Met with patient and her sister at the bedside to discuss CIR recommendations and goals/expectations of CIR stay.  Reviewed case with Dr. Ranell Patrick who feels we can work towards maximizing independence in preparation for surgery scheduled at the end of next week.  I reviewed 3 hrs/day of therapy and expectation that, if patient returns to CIR following surgery, the expectation would be home with family support, goals TBD based on progress.  Her sister confirms that she has excellent support between pt's spouse, and other family members.  We will likely have to schedule therapy around radiation as it has already been scheduled for mid day, starting tomorrow.  I also reviewed that we will need insurance approval and I will start this request today.  Will continue to follow for potential admit pending insurance authorization.  ? ?Shann Medal, PT, DPT ?Admissions Coordinator ?[phone] ?01/02/22  ?3:57 PM ? ?

## 2022-01-03 ENCOUNTER — Other Ambulatory Visit: Payer: Self-pay

## 2022-01-03 ENCOUNTER — Ambulatory Visit
Admit: 2022-01-03 | Discharge: 2022-01-03 | Disposition: A | Discharge status: Home / Self Care | Payer: Medicare Other | Attending: Radiation Oncology | Admitting: Radiation Oncology

## 2022-01-03 ENCOUNTER — Encounter (HOSPITAL_COMMUNITY): Payer: Self-pay | Admitting: Physical Medicine & Rehabilitation

## 2022-01-03 ENCOUNTER — Encounter (HOSPITAL_COMMUNITY): Payer: Self-pay | Admitting: Internal Medicine

## 2022-01-03 ENCOUNTER — Inpatient Hospital Stay (HOSPITAL_COMMUNITY)
Admission: RE | Admit: 2022-01-03 | Discharge: 2022-01-11 | DRG: 092 | Disposition: A | Discharge status: Other Acute Inpatient Hospital | Payer: Medicare Other | Source: Intra-hospital | Attending: Physical Medicine & Rehabilitation | Admitting: Physical Medicine & Rehabilitation

## 2022-01-03 DIAGNOSIS — Z79899 Other long term (current) drug therapy: Secondary | ICD-10-CM

## 2022-01-03 DIAGNOSIS — D72829 Elevated white blood cell count, unspecified: Secondary | ICD-10-CM

## 2022-01-03 DIAGNOSIS — J449 Chronic obstructive pulmonary disease, unspecified: Secondary | ICD-10-CM | POA: Diagnosis present

## 2022-01-03 DIAGNOSIS — C7931 Secondary malignant neoplasm of brain: Secondary | ICD-10-CM | POA: Diagnosis present

## 2022-01-03 DIAGNOSIS — R2689 Other abnormalities of gait and mobility: Principal | ICD-10-CM | POA: Diagnosis present

## 2022-01-03 DIAGNOSIS — Z7983 Long term (current) use of bisphosphonates: Secondary | ICD-10-CM

## 2022-01-03 DIAGNOSIS — Z9049 Acquired absence of other specified parts of digestive tract: Secondary | ICD-10-CM | POA: Diagnosis not present

## 2022-01-03 DIAGNOSIS — R5381 Other malaise: Secondary | ICD-10-CM | POA: Diagnosis present

## 2022-01-03 DIAGNOSIS — K5901 Slow transit constipation: Secondary | ICD-10-CM | POA: Diagnosis not present

## 2022-01-03 DIAGNOSIS — K5900 Constipation, unspecified: Secondary | ICD-10-CM

## 2022-01-03 DIAGNOSIS — Z923 Personal history of irradiation: Secondary | ICD-10-CM

## 2022-01-03 DIAGNOSIS — C3490 Malignant neoplasm of unspecified part of unspecified bronchus or lung: Secondary | ICD-10-CM | POA: Diagnosis present

## 2022-01-03 DIAGNOSIS — Z8616 Personal history of COVID-19: Secondary | ICD-10-CM | POA: Diagnosis not present

## 2022-01-03 DIAGNOSIS — E039 Hypothyroidism, unspecified: Secondary | ICD-10-CM | POA: Diagnosis present

## 2022-01-03 DIAGNOSIS — C3411 Malignant neoplasm of upper lobe, right bronchus or lung: Secondary | ICD-10-CM

## 2022-01-03 DIAGNOSIS — Z510 Encounter for antineoplastic radiation therapy: Secondary | ICD-10-CM | POA: Diagnosis present

## 2022-01-03 DIAGNOSIS — G9389 Other specified disorders of brain: Secondary | ICD-10-CM | POA: Diagnosis not present

## 2022-01-03 LAB — RAD ONC ARIA SESSION SUMMARY
Course Elapsed Days: 0
Plan Fractions Treated to Date: 1
Plan Prescribed Dose Per Fraction: 8 Gy
Plan Total Fractions Prescribed: 3
Plan Total Prescribed Dose: 24 Gy
Reference Point Dosage Given to Date: 8 Gy
Reference Point Session Dosage Given: 8 Gy
Session Number: 1

## 2022-01-03 LAB — TYPE AND SCREEN
ABO/RH(D): A POS
Antibody Screen: NEGATIVE

## 2022-01-03 MED ORDER — TRAZODONE HCL 50 MG PO TABS: 25.0000 mg | ORAL_TABLET | Freq: Every evening | ORAL | Status: DC | PRN

## 2022-01-03 MED ORDER — DEXAMETHASONE SODIUM PHOSPHATE 4 MG/ML IJ SOLN
4.0000 mg | Freq: Four times a day (QID) | INTRAMUSCULAR | 0 refills | Status: DC
Start: 2022-01-03 — End: 2022-01-13

## 2022-01-03 MED ORDER — CHLORHEXIDINE GLUCONATE CLOTH 2 % EX PADS: 6.0000 | MEDICATED_PAD | Freq: Once | CUTANEOUS | Status: DC

## 2022-01-03 MED ORDER — FLUTICASONE PROPIONATE 50 MCG/ACT NA SUSP
1.0000 | Freq: Every day | NASAL | Status: DC
  Administered 2022-01-04 – 2022-01-11 (×7): 1 via NASAL
  Filled 2022-01-03: qty 16

## 2022-01-03 MED ORDER — FLEET ENEMA 7-19 GM/118ML RE ENEM: 1.0000 | ENEMA | Freq: Once | RECTAL | Status: DC | PRN

## 2022-01-03 MED ORDER — OXYCODONE HCL 5 MG PO TABS
5.0000 mg | ORAL_TABLET | ORAL | Status: DC | PRN
  Administered 2022-01-04 (×2): 5 mg via ORAL
  Filled 2022-01-03 (×2): qty 1

## 2022-01-03 MED ORDER — CEFAZOLIN SODIUM-DEXTROSE 2-4 GM/100ML-% IV SOLN: 2.0000 g | INTRAVENOUS | Status: DC

## 2022-01-03 MED ORDER — ALBUTEROL SULFATE (2.5 MG/3ML) 0.083% IN NEBU: 2.5000 mg | INHALATION_SOLUTION | RESPIRATORY_TRACT | Status: DC | PRN

## 2022-01-03 MED ORDER — LEVOTHYROXINE SODIUM 75 MCG PO TABS
75.0000 ug | ORAL_TABLET | Freq: Every day | ORAL | Status: DC
  Administered 2022-01-04 – 2022-01-11 (×8): 75 ug via ORAL
  Filled 2022-01-03 (×8): qty 1

## 2022-01-03 MED ORDER — ACETAMINOPHEN 325 MG PO TABS
325.0000 mg | ORAL_TABLET | ORAL | Status: DC | PRN
  Administered 2022-01-04: 325 mg via ORAL
  Filled 2022-01-03: qty 1

## 2022-01-03 MED ORDER — FLUTICASONE FUROATE-VILANTEROL 200-25 MCG/ACT IN AEPB
1.0000 | INHALATION_SPRAY | Freq: Every day | RESPIRATORY_TRACT | Status: DC
  Administered 2022-01-04 – 2022-01-11 (×7): 1 via RESPIRATORY_TRACT
  Filled 2022-01-03: qty 28

## 2022-01-03 MED ORDER — LORAZEPAM 2 MG/ML IJ SOLN: 1.0000 mg | Freq: Four times a day (QID) | INTRAMUSCULAR | Status: DC | PRN

## 2022-01-03 MED ORDER — GUAIFENESIN ER 600 MG PO TB12: 600.0000 mg | ORAL_TABLET | Freq: Two times a day (BID) | ORAL | Status: DC | PRN

## 2022-01-03 MED ORDER — BISACODYL 10 MG RE SUPP: 10.0000 mg | Freq: Every day | RECTAL | Status: DC | PRN

## 2022-01-03 MED ORDER — DIPHENHYDRAMINE HCL 12.5 MG/5ML PO ELIX: 12.5000 mg | ORAL_SOLUTION | Freq: Four times a day (QID) | ORAL | Status: DC | PRN

## 2022-01-03 MED ORDER — POLYETHYLENE GLYCOL 3350 17 G PO PACK
17.0000 g | PACK | Freq: Every day | ORAL | Status: DC | PRN
  Administered 2022-01-04: 17 g via ORAL

## 2022-01-03 MED ORDER — CHLORHEXIDINE GLUCONATE CLOTH 2 % EX PADS
6.0000 | MEDICATED_PAD | Freq: Once | CUTANEOUS | Status: DC
Start: 2022-01-03 — End: 2022-01-03

## 2022-01-03 MED ORDER — DEXAMETHASONE SODIUM PHOSPHATE 4 MG/ML IJ SOLN
4.0000 mg | Freq: Four times a day (QID) | INTRAMUSCULAR | Status: DC
  Administered 2022-01-03 – 2022-01-11 (×31): 4 mg via INTRAVENOUS
  Filled 2022-01-03 (×33): qty 1

## 2022-01-03 MED ORDER — GUAIFENESIN-DM 100-10 MG/5ML PO SYRP: 5.0000 mL | ORAL_SOLUTION | Freq: Four times a day (QID) | ORAL | Status: DC | PRN

## 2022-01-03 MED ORDER — PROCHLORPERAZINE 25 MG RE SUPP
12.5000 mg | Freq: Four times a day (QID) | RECTAL | Status: DC | PRN
Start: 2022-01-03 — End: 2022-01-11

## 2022-01-03 MED ORDER — PROCHLORPERAZINE MALEATE 5 MG PO TABS: 5.0000 mg | ORAL_TABLET | Freq: Four times a day (QID) | ORAL | Status: DC | PRN

## 2022-01-03 MED ORDER — PROCHLORPERAZINE EDISYLATE 10 MG/2ML IJ SOLN: 5.0000 mg | Freq: Four times a day (QID) | INTRAMUSCULAR | Status: DC | PRN

## 2022-01-03 MED ORDER — ALUM & MAG HYDROXIDE-SIMETH 200-200-20 MG/5ML PO SUSP: 30.0000 mL | ORAL | Status: DC | PRN

## 2022-01-03 NOTE — Progress Notes (Signed)
?  Name: [name]  MRN: [id number]  ?Date: 12/30/2021   DOB: Sep 24, 1950 ? ?Stereotactic Radiosurgery Operative Note ? ?PRE-OPERATIVE DIAGNOSIS:  Solitary Brain Metastasis ? ?POST-OPERATIVE DIAGNOSIS:  Solitary Brain Metastasis ? ?PROCEDURE:  Stereotactic Radiosurgery ? ?SURGEON:  Eustace Moore, MD ? ?NARRATIVE: The patient underwent a radiation treatment planning session in the radiation oncology simulation suite under the care of the radiation oncology physician and physicist.  I participated closely in the radiation treatment planning afterwards. The patient underwent planning CT which was fused to 3T high resolution MRI with 1 mm axial slices.  These images were fused on the planning system.  We contoured the gross target volumes and subsequently expanded this to yield the Planning Target Volume. I actively participated in the planning process.  I helped to define and review the target contours and also the contours of the optic pathway, eyes, brainstem and selected nearby organs at risk.  All the dose constraints for critical structures were reviewed and compared to AAPM Task Group 101.  The prescription dose conformity was reviewed.  I approved the plan electronically.   ? ?Accordingly, [name] was brought to the TrueBeam stereotactic radiation treatment linac and placed in the custom immobilization mask.  The patient was aligned according to the IR fiducial markers with BrainLab Exactrac, then orthogonal x-rays were used in ExacTrac with the 6DOF robotic table and the shifts were made to align the patient ? ?[name] received stereotactic radiosurgery uneventfully.   ? ?The detailed description of the procedure is recorded in the radiation oncology procedure note.  I was present for the duration of the procedure. ? ?DISPOSITION:  Following delivery, the patient was transported to nursing in stable condition and monitored for possible acute effects to be discharged to home in stable condition  with follow-up in one month. ? ?Eustace Moore, MD ?01/03/2022 12:18 PM  ?

## 2022-01-03 NOTE — Plan of Care (Signed)
?  Problem: Health Behavior/Discharge Planning: ?Goal: Ability to manage health-related needs will improve ?Outcome: Progressing ?  ?Problem: Clinical Measurements: ?Goal: Ability to maintain clinical measurements within normal limits will improve ?Outcome: Progressing ?Goal: Diagnostic test results will improve ?Outcome: Progressing ?  ?Problem: Activity: ?Goal: Risk for activity intolerance will decrease ?Outcome: Progressing ?  ?Problem: Nutrition: ?Goal: Adequate nutrition will be maintained ?Outcome: Progressing ?  ?Problem: Coping: ?Goal: Level of anxiety will decrease ?Outcome: Progressing ?  ?Problem: Safety: ?Goal: Ability to remain free from injury will improve ?Outcome: Progressing ?  ?Problem: Skin Integrity: ?Goal: Risk for impaired skin integrity will decrease ?Outcome: Progressing ?  ?Problem: Clinical Measurements: ?Goal: Will remain free from infection ?Outcome: Completed/Met ?Goal: Respiratory complications will improve ?Outcome: Completed/Met ?Goal: Cardiovascular complication will be avoided ?Outcome: Completed/Met ?  ?Problem: Elimination: ?Goal: Will not experience complications related to bowel motility ?Outcome: Completed/Met ?  ?Problem: Pain Managment: ?Goal: General experience of comfort will improve ?Outcome: Completed/Met ?  ?

## 2022-01-03 NOTE — Plan of Care (Signed)
?  Problem: Health Behavior/Discharge Planning: ?Goal: Ability to manage health-related needs will improve ?01/03/2022 1314 by Annie Sable, RN ?Outcome: Adequate for Discharge ?01/03/2022 0916 by Annie Sable, RN ?Outcome: Progressing ?  ?Problem: Clinical Measurements: ?Goal: Ability to maintain clinical measurements within normal limits will improve ?01/03/2022 1314 by Annie Sable, RN ?Outcome: Adequate for Discharge ?01/03/2022 0916 by Annie Sable, RN ?Outcome: Progressing ?Goal: Diagnostic test results will improve ?01/03/2022 1314 by Annie Sable, RN ?Outcome: Adequate for Discharge ?01/03/2022 0916 by Annie Sable, RN ?Outcome: Progressing ?  ?Problem: Activity: ?Goal: Risk for activity intolerance will decrease ?01/03/2022 1314 by Annie Sable, RN ?Outcome: Adequate for Discharge ?01/03/2022 0916 by Annie Sable, RN ?Outcome: Progressing ?  ?Problem: Nutrition: ?Goal: Adequate nutrition will be maintained ?01/03/2022 1314 by Annie Sable, RN ?Outcome: Adequate for Discharge ?01/03/2022 0916 by Annie Sable, RN ?Outcome: Progressing ?  ?Problem: Coping: ?Goal: Level of anxiety will decrease ?01/03/2022 1314 by Annie Sable, RN ?Outcome: Adequate for Discharge ?01/03/2022 0916 by Annie Sable, RN ?Outcome: Progressing ?  ?Problem: Safety: ?Goal: Ability to remain free from injury will improve ?01/03/2022 1314 by Annie Sable, RN ?Outcome: Adequate for Discharge ?01/03/2022 0916 by Annie Sable, RN ?Outcome: Progressing ?  ?Problem: Skin Integrity: ?Goal: Risk for impaired skin integrity will decrease ?01/03/2022 1314 by Annie Sable, RN ?Outcome: Adequate for Discharge ?01/03/2022 0916 by Annie Sable, RN ?Outcome: Progressing ?  ?Problem: Clinical Measurements: ?Goal: Will remain free from infection ?Outcome: Completed/Met ?Goal: Respiratory complications will improve ?Outcome: Completed/Met ?Goal: Cardiovascular complication will be avoided ?Outcome: Completed/Met ?   ?Problem: Elimination: ?Goal: Will not experience complications related to bowel motility ?Outcome: Completed/Met ?  ?Problem: Pain Managment: ?Goal: General experience of comfort will improve ?Outcome: Completed/Met ?  ?

## 2022-01-03 NOTE — H&P (Signed)
?  ?Physical Medicine and Rehabilitation Admission H&P ?  ?  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  ?  ?HPI: [name] is a 71 year old female with history of COPD, Hypothyroidism, depression, recent Covid, NSCLC on maintenance chemo who was admitted to [facility] on 12/30/20 with weakness, dizziness, severe HA and fall with inability to get up. She was found to have solitary right occipital lobe enhancing lesion with extensive white matter edema and midline shift of 11 mm and remote bilateral cerebellar infarcts with some mass effect on brain stem. Patient had missed last 2 immunotherapy sessions due to Covid + status as well as confusion re: follow up appt. She was started on IV steroids and case discussed with tumoer board re whole brain XRT v/s stereotactic radiosurgery. Patient elected on stereotactic surgery and was started on XRT 01/03/22 with plans for surgical resection on 04/28 by Dr. Ronnald Ramp ?  ?Patient reporting some visual changes for a few weeks, has had issues with intermittent confusion, left inattention as well as inattention to LUE, decreased coordination and posterior/left lateral lean with ADLS and mobility. CIR recommended due to functional decline.  ?  ?  ?Review of Systems  ?Constitutional:  Negative for chills and fever.  ?HENT:  Negative for hearing loss and tinnitus.   ?Eyes:  Negative for blurred vision and double vision.  ?Respiratory:  Negative for cough and shortness of breath.   ?Gastrointestinal:  Positive for constipation. Negative for heartburn.  ?Genitourinary:  Positive for frequency. Negative for dysuria.  ?Musculoskeletal:  Positive for back pain.  ?Skin:  Negative for rash.  ?Neurological:  Positive for weakness. Negative for dizziness and headaches.  ?Psychiatric/Behavioral:  The patient is not nervous/anxious.   ?All other systems reviewed and are negative. ?  ?  ?    ?Past Medical History:  ?Diagnosis Date  ? Cataract    ?  COPD (chronic obstructive pulmonary disease) ([place])    ? NSCLC of upper lobe ([place]) 04/2020  ?  ?  ?     ?Past Surgical History:  ?Procedure Laterality Date  ? APPENDECTOMY   2008  ? COLON SURGERY   2008  ?  colostomy-infection post op appendectomy  ? COLOSTOMY REVERSAL   2009  ? INTERCOSTAL NERVE BLOCK Right 10/19/2020  ?  Procedure: INTERCOSTAL NERVE BLOCK;  Surgeon: [name], MD;  Location: [place];  Service: Thoracic;  Laterality: Right;  ? NODE DISSECTION Right 10/19/2020  ?  Procedure: NODE DISSECTION;  Surgeon: [name], MD;  Location: [place];  Service: Thoracic;  Laterality: Right;  ? VIDEO BRONCHOSCOPY WITH ENDOBRONCHIAL NAVIGATION N/A 05/08/2020  ?  Procedure: VIDEO BRONCHOSCOPY WITH ENDOBRONCHIAL NAVIGATION;  Surgeon: [name], MD;  Location: [place];  Service: Thoracic;  Laterality: N/A;  ? VIDEO BRONCHOSCOPY WITH ENDOBRONCHIAL ULTRASOUND N/A 05/08/2020  ?  Procedure: VIDEO BRONCHOSCOPY WITH ENDOBRONCHIAL ULTRASOUND;  Surgeon: [name], MD;  Location: [place];  Service: Thoracic;  Laterality: N/A;  ?  ?  ?     ?Family History  ?Problem Relation Age of Onset  ? Mental illness Mother    ? Colon cancer Mother 80  ? Cancer Father    ? Colon polyps Neg Hx    ? Esophageal cancer Neg Hx    ? Stomach cancer Neg Hx    ? Rectal cancer Neg Hx    ?  ?  ?Social History: Married. Independent without AD. She  reports that she quit smoking about 2 years ago. Her smoking use included cigarettes. She has a 2.50 pack-year smoking history. She has never used smokeless tobacco. She reports that she does not currently use alcohol after a past usage of about 10.0 standard drinks per week. She reports that she does not use drugs. ?  ?  ?Allergies: No Known Allergies ?  ?  ?      ?Medications Prior to Admission  ?Medication Sig Dispense Refill  ? Acetaminophen (TYLENOL ARTHRITIS EXT RELIEF PO) Take 1-2 capsules by mouth daily as needed (pain).      ? albuterol (PROVENTIL HFA;VENTOLIN HFA) 108  (90 Base) MCG/ACT inhaler Inhale 2 puffs into the lungs every 4 (four) hours as needed for wheezing or shortness of breath (cough, shortness of breath or wheezing.). 1 Inhaler 1  ? alendronate (FOSAMAX) 70 MG tablet Take 70 mg by mouth once a week.      ? Calcium Carbonate (CALCIUM 600 PO) Take 600 mg by mouth 2 (two) times daily.       ? Cholecalciferol (VITAMIN D3 PO) Take 2,000 Units by mouth daily.      ? fluticasone (FLONASE) 50 MCG/ACT nasal spray Place 2 sprays into both nostrils daily. (Patient taking differently: Place 1 spray into both nostrils in the morning and at bedtime.) 16 g 12  ? Fluticasone-Salmeterol (ADVAIR) 250-50 MCG/DOSE AEPB Inhale 1 puff into the lungs 2 (two) times daily.       ? guaiFENesin (MUCINEX) 600 MG 12 hr tablet Take 1 tablet (600 mg total) by mouth 2 (two) times daily as needed for cough or to loosen phlegm.      ? HYDROcodone-acetaminophen (NORCO/VICODIN) 5-325 MG tablet Take 1-2 tablets by mouth every 6 (six) hours as needed for moderate pain.      ? levothyroxine (SYNTHROID) 75 MCG tablet TAKE 1 TABLET BY MOUTH EVERY DAY BEFORE BREAKFAST (Patient taking differently: Take 75 mcg by mouth daily before breakfast.) 30 tablet 2  ? Multiple Vitamin (MULTIVITAMIN) tablet Take 1 tablet by mouth daily.      ?  ?  ?  ?  ?Home: ?Home Living ?Family/patient expects to be discharged to:: Unsure ?Living Arrangements: Spouse/significant other ?Available Help at Discharge: Family ?Type of Home: House ?Home Access: Stairs to enter ?Entrance Stairs-Number of Steps: 1 or 3 ?Home Layout: One level ?Home Equipment: None ?Additional Comments: pt sister present (sister resides in [facility]) and answers most of PLOF question, pt is an unreliable hx at this time ?  ?Functional History: ?Prior Function ?Prior Level of Function : Independent/Modified Independent ?Mobility Comments: per pt sister pt has been grossly independent until ~ the last 3-4wks. functional decline per pt sister, husband is unable to  provide significant physical assist. ?  ?Functional Status:  ?Mobility: ?Bed Mobility ?Overal bed mobility: Needs Assistance ?Bed Mobility: Supine to Sit ?Supine to sit: Mod assist, HOB elevated ?General bed mobility comments: Pt in recliner at entry and exit ?Transfers ?Overall transfer level: Needs assistance ?Equipment used: Rolling walker (2 wheels) ?Transfers: Sit to/from Stand, Bed to chair/wheelchair/BSC ?Sit to Stand: Min guard, +2 safety/equipment ?Bed to/from chair/wheelchair/BSC transfer type:: Step pivot ?Step pivot transfers: Min guard, +2 safety/equipment ?General transfer comment: Pt min guard with +2 for safety, no physical assist required. Multi-modal cues for hand placement and task sequence.  Stand pivot recliner to [facility] with RW and min guard assist +2 for safety verbal cues for sequencing, attention to L side and foot position. ?Ambulation/Gait ?Ambulation/Gait assistance: +  2 safety/equipment, Min assist, Min guard ?Gait Distance (Feet): 60 Feet ?Assistive device: Rolling walker (2 wheels) ?Gait Pattern/deviations: Step-through pattern, Drifts right/left, Trunk flexed ?General Gait Details: Pt ambulated with RW with min guard assist +2 for recliner follow for safety, no overt LOB and VSS. Demonstrated strong left drift and inattention and required near constant cuing and/or min assist for adjustment of RW path to avoid obstacles. Pt can correct direction with cuing but continues to demonstrate some L inattention. ?Gait velocity: decreased ?  ?ADL: ?ADL ?Overall ADL's : Needs assistance/impaired ?Grooming: Minimal assistance, Sitting ?Upper Body Bathing: Moderate assistance, Sitting ?Lower Body Bathing: Maximal assistance, Sitting/lateral leans, Sit to/from stand ?Upper Body Dressing : Moderate assistance, Sitting ?Upper Body Dressing Details (indicate cue type and reason): To don hospital gown, difficulty with coordination L UE needing verbal cues. ?Lower Body Dressing: Total assistance, Sit  to/from stand, Sitting/lateral leans ?Lower Body Dressing Details (indicate cue type and reason): To doff soiled mesh underwear and don clean socks due to impaired sitting and standing balance. High fall risk.

## 2022-01-03 NOTE — Progress Notes (Addendum)
PMR Admission Coordinator Pre-Admission Assessment ?  ?Patient: [name] is an 71 y.o., female ?MRN: [id number] ?DOB: 01/11/1951 ?Height: 5' 3"  (160 cm) ?Weight: 56.8 kg ?  ?Insurance Information ?HMO: yes    PPO:      PCP:      IPA:      80/20:      OTHER:  ?PRIMARY: Blue Medicare      Policy#: [id number]      Subscriber: pt ?CM Name: Larene Beach      Phone#: [phone]     Fax#: [phone] ?Pre-Cert#: [id number] auth for CIR from [place] at [facility] with updates due to her 4/28 for continued stay   Employer:  ?Benefits:  Phone #: [phone]     Name:  ?Eff. Date: 09/16/21     Deduct: $0      Out of Pocket Max: $3700 (met)      Life Max: n/a ?CIR: $355/day for days 1-6      SNF: 20 full days ?Outpatient:      Co-Pay: $10/visit ?Home Health: 100%      Co-Pay:  ?DME: 80%     Co-Ins: 20% ?Providers:  ?SECONDARY:       Policy#:      Phone#:  ?  ?Financial Counselor:       Phone#:  ?  ?The ?Data Collection Information Summary? for patients in Inpatient Rehabilitation Facilities with attached ?Privacy Act [place] Records? was provided and verbally reviewed with: Patient and Family ?  ?Emergency Contact Information ?Contact Information   ?  ?  Name Relation Home Work Mobile  ?  Larsh,CHARLES Spouse [phone]   [phone]  ?  sister,Lucy Sister     [phone]  ?  ?   ?  ?  ?Current Medical History  ?Patient Admitting Diagnosis: brain mets ?  ?History of Present Illness: Pt is a 71 y/o female with PMH of COPD, hypothyroidism, and NSCLC who presents to [facility] on 4/16 with several week history of unsteadiness and falls, as well as headaches that were not responsive to OTC medication.  She has been on maintenance immunotherapy with atezolzumab every 3 weeks.  ED workup revealed labs and vitals WNL.  CT head revealed large 4 cm engancing intra-axial mass in the posterior right hemisphere (parietoccipital sulcus) with a large volume of hemispheric vasogenic edema, most compatible with brain  metastasis, intracranial mass effect with leftward midline shift up to 85m, trapped left lateral ventricle, and effaced suprasellar cistern.  Neurosurgery, oncology, and rad-onc consulted.  Recommended to start decadron protocol, radiation treatment in 3 fractions followed by craniotomy for resection.  Pt with intermittent confusion.  Tolerating regular diet.  Therapy ongoing and recommendations are for CIR to maximize independence prior to surgery, then return to CIR to reduce caregiver burden and maximize functional independence with planned return home with family support.   ?  ?[facility] the [facility] admission coordinator and physician. ?  ?Past Medical History  ?    ?Past Medical History:  ?Diagnosis Date  ? Cataract    ? [place]    ?  ?  ?Has the patient had major surgery during 100 days prior to admission? No and plan for surgery 4/28 ?  ?Family History   ?family history includes Cancer in her father; Colon cancer (age of onset: 725 in her mother; Mental illness in her mother. ?  ?Current Medications ?  ?Current Facility-Administered Medications:  ?  albuterol (PROVENTIL) (2.5 MG/3ML) 0.083% nebulizer solution 2.5 mg, 2.5 mg, Inhalation, Q4H PRN, Debbe Odea, MD ?  dexamethasone (DECADRON) injection 4 mg, 4 mg, Intravenous, Q6H, Kyle, Tyrone A, DO, 4 mg at 01/03/22 1129 ?  fluticasone (FLONASE) 50 MCG/ACT nasal spray 1 spray, 1 spray, Each Nare, Daily, Debbe Odea, MD, 1 spray at 01/03/22 0901 ?  fluticasone furoate-vilanterol (BREO ELLIPTA) 200-25 MCG/ACT 1 puff, 1 puff, Inhalation, Daily, Debbe Odea, MD, 1 puff at 01/03/22 0903 ?  guaiFENesin (MUCINEX) 12 hr tablet 600 mg, 600 mg, Oral, BID PRN, Debbe Odea, MD ?  levothyroxine (SYNTHROID) tablet 75 mcg, 75 mcg, Oral, QAC breakfast, Debbe Odea, MD, 75 mcg at 01/03/22 0506 ?  LORazepam (ATIVAN) injection 1 mg, 1 mg, Intravenous, Q6H PRN, Marylyn Ishihara,  Tyrone A, DO ?  morphine (PF) 2 MG/ML injection 2 mg, 2 mg, Intravenous, Q4H PRN, Marylyn Ishihara, Tyrone A, DO ?  oxyCODONE (Oxy IR/ROXICODONE) immediate release tablet 5 mg, 5 mg, Oral, Q4H PRN, Marylyn Ishihara, Tyrone A, DO ?  prochlorperazine (COMPAZINE) injection 10 mg, 10 mg, Intravenous, Q6H PRN, Marylyn Ishihara, Tyrone A, DO ?  ?Patients Current Diet:  ?Diet Order   ?  ?         ?    Diet - low sodium heart healthy       ?  ?    Diet Heart Room service appropriate? Yes; Fluid consistency: Thin  Diet effective now       ?  ?  ?   ?  ?  ?   ?  ?  ?Precautions / Restrictions ?Precautions ?Precautions: Fall ?Restrictions ?Weight Bearing Restrictions: No  ?  ?Has the patient had 2 or more falls or a fall with injury in the past year? Yes ?  ?Prior Activity Level ?Limited Community (1-2x/wk): prior to 3 weeks ago was independent with mobility and ADLs ?  ?Prior Functional Level ?Self Care: Did the patient need help bathing, dressing, using the toilet or eating? Independent ?  ?Indoor Mobility: Did the patient need assistance with walking from room to room (with or without device)? Independent ?  ?Stairs: Did the patient need assistance with internal or external stairs (with or without device)? Independent ?  ?Functional Cognition: Did the patient need help planning regular tasks such as shopping or remembering to take medications? Independent ?  ?Patient Information ?Are you of Hispanic, Latino/a,or Spanish origin?: A. No, not of Hispanic, Latino/a, or Spanish origin ?What is your race?: A. White ?Do you need or want an interpreter to communicate with a doctor or health care staff?: 0. No ?  ?Patient's Response To:  ?Health Literacy and Transportation ?Is the patient able to respond to health literacy and transportation needs?: Yes ?Health Literacy - How often do you need to have someone help you when you read instructions, pamphlets, or other written material from your doctor or pharmacy?: Sometimes ?In the past 12 months, has lack of  transportation kept you from medical appointments or from getting medications?: No ?In the past 12 months, has lack of transportation kept you from meetings, work, or from getting things needed for daily living?: No ?  ?Home Assistive Devices / Equipment ?Home Assistive Devices/Equipment: None ?Home Equipment: None ?  ?Prior Device Use: Indicate devices/aids used by the patient prior to current illness, exacerbation or injury? None of the above ?  ?Current Functional Level ?Cognition ?  Overall Cognitive Status: Impaired/Different from baseline ?Current Attention Level: Focused ?Orientation Level: Oriented X4 ?Following Commands: Follows one step  commands with increased time, Follows multi-step commands inconsistently ?Safety/Judgement: Decreased awareness of safety, Decreased awareness of deficits ?General Comments: Patient becomes distracted during transfers needing cues to redirect. Verbal cues for sequencing how to turn walker during transfers. Pt reporting confusion about the medications she is taking and stated "well maybe I just dreamed it." ?   ?Extremity Assessment ?(includes Sensation/Coordination) ?  Upper Extremity Assessment: LUE deficits/detail ?LUE Deficits / Details: Did not formally assess however patient presents with left inattention at times, difficulty noticing L arm is not through her gown. Decreased coordination when trying to thread L UE through gown. Also keeping L arm close to body with elbow flexed needing cues to extend arm when dressing. ?LUE Sensation: decreased light touch ?LUE Coordination: decreased fine motor, decreased gross motor  ?Lower Extremity Assessment: Defer to PT evaluation ?RLE Deficits / Details: grossly 3+/5, AROM WFL ?LLE Deficits / Details: AAROM grossly WFL, strength 2+ to 3/5, cognition limits testing  ?   ?ADLs ?  Overall ADL's : Needs assistance/impaired ?Grooming: Minimal assistance, Sitting ?Upper Body Bathing: Moderate assistance, Sitting ?Lower Body Bathing:  Maximal assistance, Sitting/lateral leans, Sit to/from stand ?Upper Body Dressing : Moderate assistance, Sitting ?Upper Body Dressing Details (indicate cue type and reason): To don hospital gown, difficulty with coordina

## 2022-01-03 NOTE — Progress Notes (Signed)
Inpatient Rehabilitation Admission Medication Review by a Pharmacist ? ?A complete drug regimen review was completed for this patient to identify any potential clinically significant medication issues. ? ?High Risk Drug Classes Is patient taking? 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 ? ? ?Type of Medication Issue Identified Description of Issue Recommendation(s)  ?Drug Interaction(s) (clinically significant) ?    ?Duplicate Therapy ?    ?Allergy ?    ?No Medication Administration End Date ?    ?Incorrect Dose ?    ?Additional Drug Therapy Needed ?    ?Significant med changes from prior encounter (inform family/care partners about these prior to discharge).    ?Other ? PTA meds: ?Fosamax ?Calcium ?Vitamin D ?Advair Restart fosamax at discharge from CIR ?Change Breo ellipta to Advair at time of discharge from CIR ?Restart other PTA meds during CIR admission as clinically necessary or at time of discharge, if warranted  ? ? ?Clinically significant medication issues were identified that warrant physician communication and completion of prescribed/recommended actions by midnight of the next day:  No ? ?Time spent performing this drug regimen review (minutes):  30 ? ? ?Bradly Sangiovanni BS, PharmD, BCPS ?Clinical Pharmacist ?01/03/2022 2:45 PM ? ?Contact: [phone] after 3 PM ? ?"Be curious, not judgmental..." -Jamal Maes ?

## 2022-01-03 NOTE — H&P (Signed)
Physician Discharge Summary  ?[name] Salmon RCB:[id number] DOB: 12-18-1950 DOA: 12/30/2021 ? ?PCP: [name], PA ? ?Admit date: 12/30/2021 ?Discharge date: 01/03/2022 ? ?Admitted From: Home  ?Disposition:  CIR ? ?Recommendations for Outpatient Follow-up:  ?Follow up with PCP in 1-2 weeks ?Continue with IV decadron 4 mg IV every 6 hours.  ? ? ?Discharge Condition: Stable.  ?CODE STATUS: Full code ?Diet recommendation: Heart Healthy  ? ?Brief/Interim Summary: ?71 year old with past medical history significant for non-small cell lung cancer, COPD, hypothyroidism, who presents to the hospital for being unsteady on her feet and falling.  Subsequent evaluation with  a CT head revealed right hemispheric mass with large volume of vasogenic edema compatible with brain metastasis with a leftward midline shift.  ?  ?Radiation oncology, neurosurgery, medical oncology consulted.  Plan is for preoperative history to take radiation treatment in 3 fractions followed by craniotomy for resection.  ?  ? 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 ?Radiation oncology and neurosurgery recommending 3 treatments of preop stereotactic radiation followed by craniotomy for resection.  Surgery planning for Friday next week. ?-I discussed with Dr. Tammi Klippel, patient can have radiation treatment outpatient. ?-Continue  with IV Decadron 4 mg every 6 hours.   ?Transfer to CIR. She will continue with radiation treatment and subsequent Sx next Friday.  ?  ?Acute encephalopathy ?Suspect related to brain mets.  Patient confused. ?  ?Depression ?Not currently on medications.  ?  ?Hypocalcemia ?Resolved./  ?  ?Hypothyroidism ?Continue with Synthroid.  ?  ?Falls ?In setting of brain mets.  ?PT following. Transfer to CIR ?  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 ?-on Advair/ at home.  Currently on Breo/ellipta.  ?  ?  ?  ? ?Discharge Diagnoses:  ?Principal Problem: ?  Malignant neoplasm metastatic to brain [facility]) ?Active Problems: ?  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  Malignant neoplasm of upper lobe of right lung ([place]) ?  Falls ?  Hypothyroidism ?  Hypocalcemia ?  Depression ?  Malignant neoplasm of lung metastatic to brain [facility]) ?  Acute encephalopathy ? ? ? ?Discharge Instructions ? ?Discharge Instructions   ? ? Diet - low sodium heart healthy   Complete by: As directed ?  ? Increase activity slowly   Complete by: As directed ?  ? ?  ? ?Allergies as of 01/03/2022   ?No Known Allergies ?  ? ?  ?Medication List  ?  ? ?TAKE these medications   ? ?albuterol 108 (90 Base) MCG/ACT inhaler ?Commonly known as: VENTOLIN HFA ?Inhale 2 puffs into the lungs every 4 (four) hours as needed for wheezing or shortness of breath (cough, shortness of breath or wheezing.). ?  ?alendronate 70 MG tablet ?Commonly known as: FOSAMAX ?Take 70 mg by mouth once a week. ?  ?CALCIUM 600 PO ?Take 600 mg by mouth 2 (two) times daily. ?  ?dexamethasone 4 MG/ML injection ?Commonly known as: DECADRON ?Inject 1 mL (4 mg total) into the vein every 6 (six) hours. ?  ?fluticasone 50 MCG/ACT nasal spray ?Commonly known as: FLONASE ?Place 2 sprays into both nostrils daily. ?What changed:  ?how much to take ?when to take this ?  ?Fluticasone-Salmeterol 250-50 MCG/DOSE Aepb ?Commonly known as: ADVAIR ?Inhale 1 puff into the lungs 2 (two) times daily. ?  ?guaiFENesin 600 MG 12 hr tablet ?Commonly known as: [place] ?  Take 1 tablet (600 mg total) by mouth 2 (two) times daily as needed for cough or to loosen phlegm. ?  ?HYDROcodone-acetaminophen 5-325 MG tablet ?Commonly known as: NORCO/VICODIN ?Take 1-2 tablets by mouth every 6 (six) hours as needed for moderate pain. ?  ?levothyroxine 75 MCG tablet ?Commonly known as: SYNTHROID ?TAKE 1 TABLET BY MOUTH EVERY DAY BEFORE BREAKFAST ?What changed: See the new instructions. ?  ?multivitamin tablet ?Take 1  tablet by mouth daily. ?  ?TYLENOL ARTHRITIS EXT RELIEF PO ?Take 1-2 capsules by mouth daily as needed (pain). ?  ?VITAMIN D3 PO ?Take 2,000 Units by mouth daily. ?  ? ?  ? ? ?No Known Allergies ? ?Consultations: ?Radiation oncology ?Oncology ?Neurosurgery  ? ? ?Procedures/Studies: ?DG Chest 2 View ? ?Result Date: 12/30/2021 ?CLINICAL DATA:  71 year old female with lung cancer. Altered mental status and dizziness. 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 Fall. EXAM: CHEST - 2 VIEW COMPARISON:  Chest CT 08/16/2021 and earlier. FINDINGS: Semi upright AP and lateral views of the chest at 1001 hours. Chronic postoperative volume reduction in the right lung appears stable. Stable cardiac size and mediastinal contours. Visualized tracheal air column is within normal limits. No pneumothorax, pulmonary edema, pleural effusion or acute pulmonary opacity identified. No acute osseous abnormality identified. Negative visible bowel gas. IMPRESSION: Post treatment changes to the right lung. No acute cardiopulmonary abnormality or acute traumatic injury identified. Electronically Signed   By: Genevie Ann M.D.   On: 12/30/2021 10:17  ? ?CT HEAD W & WO CONTRAST (5MM) ? ?Addendum Date: 12/30/2021   ?ADDENDUM REPORT: 12/30/2021 10:18 ADDENDUM: Critical Value/emergent results were called by telephone at the time of interpretation on 12/30/2021 At 1015 hours to Dr. Isla Pence , who verbally acknowledged these results. Electronically Signed   By: Genevie Ann M.D.   On: 12/30/2021 10:18  ? ?Result Date: 12/30/2021 ?CLINICAL DATA:  71 year old female with lung cancer. Altered mental status and dizziness. Brain MRI in 2021 was negative for metastatic disease. EXAM: CT HEAD WITHOUT AND WITH CONTRAST TECHNIQUE: Contiguous axial images were obtained from the base of the skull through the vertex without and with intravenous contrast. RADIATION DOSE REDUCTION: This exam was performed according to the departmental dose-optimization program which  includes automated exposure control, adjustment of the mA and/or kV according to patient size and/or use of iterative reconstruction technique. CONTRAST:  38mL OMNIPAQUE IOHEXOL 300 MG/ML  SOLN COMPARISON:  Brain MRI 05/18/2020. FINDINGS: Brain: Lobulated, nodular and enhancing mass in the posterior right hemisphere center at the right parieto-occipital sulcus is 34 by 40 x 33 mm (AP by transverse by CC) with a large volume of right hemisphere vasogenic edema. Associated intracranial mass effect with leftward midline shift of up to 13 mm (coronal image 28). Mass effect on the right lateral ventricle and there is mild trapping of the left lateral ventricle now with some transependymal edema on that side suspected. The suprasellar cistern is effaced but other basilar cisterns remain patent. No superimposed acute intracranial hemorrhage, acute cortically based infarct. Following contrast no other enhancing brain metastasis is identified. Vascular: Calcified atherosclerosis at the skull base. The major intracranial vascular structures appear to be enhancing as expected. Skull: No acute or suspicious osseous lesion identified. Sinuses/Orbits: Visualized paranasal sinuses and mastoids are stable and well aerated. Other: Visualized orbits and scalp soft tissues are within normal limits. IMPRESSION: 1. Relatively large 4 cm enhancing intra-axial mass in the posterior right hemisphere (parieto-occipital sulcus) with a large volume of hemispheric  vasogenic edema most compatible with brain metastasis in this setting. 2. Intracranial mass effect with leftward midline shift of up to 13 mm, trapped left lateral ventricle, and effaced suprasellar cistern. Mild associated transependymal edema. 3. No other brain metastasis identified by CT. Electronically Signed: By: Genevie Ann M.D. On: 12/30/2021 10:10  ? ?CT Cervical Spine Wo Contrast ? ?Result Date: 12/30/2021 ?CLINICAL DATA:  72 year old female with lung cancer. Altered mental  status and dizziness. 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 EXAM: CT CERVICAL SPINE WITHOUT CONTRAST TECHNIQUE: Multidetector CT imaging of the cervical spine was performed without intravenous contrast.

## 2022-01-03 NOTE — TOC Transition Note (Signed)
Transition of Care (TOC) - CM/SW Discharge Note ? ? ?Patient Details  ?Name: [name] ?MRN: [id number] ?Date of Birth: 1951-03-02 ? ?Transition of Care [facility]) CM/SW Contact:  ?Dessa Phi, RN ?Phone Number: ?01/03/2022, 12:04 PM ? ? ?Clinical Narrative: CIR to manage d/c, transportation. No further CM needs.   ? ? ? ?Final next level of care: [place] ?Barriers to Discharge: No Barriers Identified ? ? ?Patient Goals and CMS Choice ?Patient states their goals for this hospitalization and ongoing recovery are:: CIR ?CMS Medicare.gov Compare Post Acute Care list provided to:: Patient ?Choice offered to / list presented to : Patient ? ?Discharge Placement ?  ?           ?  ?  ?  ?  ? ?Discharge Plan and Services ?  ?  ?Post Acute Care Choice: IP Rehab          ?  ?  ?  ?  ?  ?  ?  ?  ?  ?  ? ?Social Determinants of Health (SDOH) Interventions ?  ? ? ?Readmission Risk Interventions ?   ? View : No data to display.  ?  ?  ?  ? ? ? ? ? ?

## 2022-01-03 NOTE — Progress Notes (Signed)
Pt discharged to CIR today per Dr. Tyrell Antonio. Pt's IV sites remain in place for d/c, sites WDL. Report given to [facility]. Pt and sister, Lorre Nick, updated on POC. Verbalized understanding. ?

## 2022-01-03 NOTE — Progress Notes (Signed)
Inpatient Rehab Admissions Coordinator:   ? ?I have insurance approval and a bed available for pt to admit to CIR today. Dr. Tyrell Antonio in agreement.  Will let pt/family and TOC team know.  I will arrange transport with [facility] for pickup after radiation.  ? ?Shann Medal, PT, DPT ?Admissions Coordinator ?[phone] ?01/03/22  ?12:06 PM  ? ?

## 2022-01-03 NOTE — Care Management Important Message (Signed)
Important Message ? ?Patient Details IM Letter given to the Patient. ?Name: [name] ?MRN: [id number] ?Date of Birth: November 28, 1950 ? ? ?Medicare Important Message Given:  Yes ? ? ? ? ?Kerin Salen ?01/03/2022, 2:15 PM ?

## 2022-01-03 NOTE — H&P (Signed)
? ? ?Physical Medicine and Rehabilitation Admission H&P ? ?  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 ? ?HPI: [name] is a 71 year old female with history of COPD, Hypothyroidism, depression, recent Covid, NSCLC on maintenance chemo who was admitted to [facility] on 12/30/20 with weakness, dizziness, severe HA and fall with inability to get up. She was found to have solitary right occipital lobe enhancing lesion with extensive white matter edema and midline shift of 11 mm and remote bilateral cerebellar infarcts with some mass effect on brain stem. Patient had missed last 2 immunotherapy sessions due to Covid + status as well as confusion re: follow up appt. She was started on IV steroids and case discussed with tumoer board re whole brain XRT v/s stereotactic radiosurgery. Patient elected on stereotactic surgery and was started on XRT 01/03/22 with plans for surgical resection on 04/28 by Dr. Ronnald Ramp ? ?Patient reporting some visual changes for a few weeks, has had issues with intermittent confusion, left inattention as well as inattention to LUE, decreased coordination and posterior/left lateral lean with ADLS and mobility. CIR recommended due to functional decline.  ? ? ?Review of Systems  ?Constitutional:  Negative for chills and fever.  ?HENT:  Negative for hearing loss and tinnitus.   ?Eyes:  Negative for blurred vision and double vision.  ?Respiratory:  Negative for cough and shortness of breath.   ?Gastrointestinal:  Positive for constipation. Negative for heartburn.  ?Genitourinary:  Positive for frequency. Negative for dysuria.  ?Musculoskeletal:  Positive for back pain.  ?Skin:  Negative for rash.  ?Neurological:  Positive for weakness. Negative for dizziness and headaches.  ?Psychiatric/Behavioral:  The patient is not nervous/anxious.   ?All other systems reviewed and are negative. ? ? ?Past Medical History:  ?Diagnosis Date  ? Cataract   ? COPD (chronic  obstructive pulmonary disease) ([place])   ? NSCLC of upper lobe ([place]) 04/2020  ? ? ?Past Surgical History:  ?Procedure Laterality Date  ? APPENDECTOMY  2008  ? COLON SURGERY  2008  ? colostomy-infection post op appendectomy  ? COLOSTOMY REVERSAL  2009  ? INTERCOSTAL NERVE BLOCK Right 10/19/2020  ? Procedure: INTERCOSTAL NERVE BLOCK;  Surgeon: [name], MD;  Location: [place];  Service: Thoracic;  Laterality: Right;  ? NODE DISSECTION Right 10/19/2020  ? Procedure: NODE DISSECTION;  Surgeon: [name], MD;  Location: [place];  Service: Thoracic;  Laterality: Right;  ? VIDEO BRONCHOSCOPY WITH ENDOBRONCHIAL NAVIGATION N/A 05/08/2020  ? Procedure: VIDEO BRONCHOSCOPY WITH ENDOBRONCHIAL NAVIGATION;  Surgeon: [name], MD;  Location: [place];  Service: Thoracic;  Laterality: N/A;  ? VIDEO BRONCHOSCOPY WITH ENDOBRONCHIAL ULTRASOUND N/A 05/08/2020  ? Procedure: VIDEO BRONCHOSCOPY WITH ENDOBRONCHIAL ULTRASOUND;  Surgeon: [name], MD;  Location: [place];  Service: Thoracic;  Laterality: N/A;  ? ? ?Family History  ?Problem Relation Age of Onset  ? Mental illness Mother   ? Colon cancer Mother 24  ? Cancer Father   ? Colon polyps Neg Hx   ? Esophageal cancer Neg Hx   ? Stomach cancer Neg Hx   ? Rectal cancer Neg Hx   ? ? ?Social History: Married. Independent without AD. She  reports that she quit smoking about 2 years ago. Her smoking use included cigarettes. She has a 2.50 pack-year smoking history. She has never used smokeless tobacco. She reports that she does not currently use alcohol after a past usage of about 10.0 standard  drinks per week. She reports that she does not use drugs. ? ? ?Allergies: No Known Allergies ? ? ?Medications Prior to Admission  ?Medication Sig Dispense Refill  ? Acetaminophen (TYLENOL ARTHRITIS EXT RELIEF PO) Take 1-2 capsules by mouth daily as needed (pain).    ? albuterol (PROVENTIL HFA;VENTOLIN HFA) 108 (90 Base) MCG/ACT inhaler Inhale 2 puffs into the lungs  every 4 (four) hours as needed for wheezing or shortness of breath (cough, shortness of breath or wheezing.). 1 Inhaler 1  ? alendronate (FOSAMAX) 70 MG tablet Take 70 mg by mouth once a week.    ? Calcium Carbonate (CALCIUM 600 PO) Take 600 mg by mouth 2 (two) times daily.     ? Cholecalciferol (VITAMIN D3 PO) Take 2,000 Units by mouth daily.    ? fluticasone (FLONASE) 50 MCG/ACT nasal spray Place 2 sprays into both nostrils daily. (Patient taking differently: Place 1 spray into both nostrils in the morning and at bedtime.) 16 g 12  ? Fluticasone-Salmeterol (ADVAIR) 250-50 MCG/DOSE AEPB Inhale 1 puff into the lungs 2 (two) times daily.     ? guaiFENesin (MUCINEX) 600 MG 12 hr tablet Take 1 tablet (600 mg total) by mouth 2 (two) times daily as needed for cough or to loosen phlegm.    ? HYDROcodone-acetaminophen (NORCO/VICODIN) 5-325 MG tablet Take 1-2 tablets by mouth every 6 (six) hours as needed for moderate pain.    ? levothyroxine (SYNTHROID) 75 MCG tablet TAKE 1 TABLET BY MOUTH EVERY DAY BEFORE BREAKFAST (Patient taking differently: Take 75 mcg by mouth daily before breakfast.) 30 tablet 2  ? Multiple Vitamin (MULTIVITAMIN) tablet Take 1 tablet by mouth daily.    ? ? ? ? ?Home: ?Home Living ?Family/patient expects to be discharged to:: Unsure ?Living Arrangements: Spouse/significant other ?Available Help at Discharge: Family ?Type of Home: House ?Home Access: Stairs to enter ?Entrance Stairs-Number of Steps: 1 or 3 ?Home Layout: One level ?Home Equipment: None ?Additional Comments: pt sister present (sister resides in [facility]) and answers most of PLOF question, pt is an unreliable hx at this time ?  ?Functional History: ?Prior Function ?Prior Level of Function : Independent/Modified Independent ?Mobility Comments: per pt sister pt has been grossly independent until ~ the last 3-4wks. functional decline per pt sister, husband is unable to provide significant physical assist. ? ?Functional Status:  ?Mobility: ?Bed  Mobility ?Overal bed mobility: Needs Assistance ?Bed Mobility: Supine to Sit ?Supine to sit: Mod assist, HOB elevated ?General bed mobility comments: Pt in recliner at entry and exit ?Transfers ?Overall transfer level: Needs assistance ?Equipment used: Rolling walker (2 wheels) ?Transfers: Sit to/from Stand, Bed to chair/wheelchair/BSC ?Sit to Stand: Min guard, +2 safety/equipment ?Bed to/from chair/wheelchair/BSC transfer type:: Step pivot ?Step pivot transfers: Min guard, +2 safety/equipment ?General transfer comment: Pt min guard with +2 for safety, no physical assist required. Multi-modal cues for hand placement and task sequence.  Stand pivot recliner to [facility] with RW and min guard assist +2 for safety verbal cues for sequencing, attention to L side and foot position. ?Ambulation/Gait ?Ambulation/Gait assistance: +2 safety/equipment, Min assist, Min guard ?Gait Distance (Feet): 60 Feet ?Assistive device: Rolling walker (2 wheels) ?Gait Pattern/deviations: Step-through pattern, Drifts right/left, Trunk flexed ?General Gait Details: Pt ambulated with RW with min guard assist +2 for recliner follow for safety, no overt LOB and VSS. Demonstrated strong left drift and inattention and required near constant cuing and/or min assist for adjustment of RW path to avoid obstacles. Pt can correct direction with cuing  but continues to demonstrate some L inattention. ?Gait velocity: decreased ?  ? ?ADL: ?ADL ?Overall ADL's : Needs assistance/impaired ?Grooming: Minimal assistance, Sitting ?Upper Body Bathing: Moderate assistance, Sitting ?Lower Body Bathing: Maximal assistance, Sitting/lateral leans, Sit to/from stand ?Upper Body Dressing : Moderate assistance, Sitting ?Upper Body Dressing Details (indicate cue type and reason): To don hospital gown, difficulty with coordination L UE needing verbal cues. ?Lower Body Dressing: Total assistance, Sit to/from stand, Sitting/lateral leans ?Lower Body Dressing Details (indicate cue  type and reason): To doff soiled mesh underwear and don clean socks due to impaired sitting and standing balance. High fall risk. ?Toilet Transfer: Moderate assistance, Cueing for safety, Cueing for sequenci

## 2022-01-04 ENCOUNTER — Telehealth: Payer: Self-pay | Admitting: Internal Medicine

## 2022-01-04 LAB — CBC WITH DIFFERENTIAL/PLATELET
Abs Immature Granulocytes: 0.31 10*3/uL — ABNORMAL HIGH (ref 0.00–0.07)
Basophils Absolute: 0 10*3/uL (ref 0.0–0.1)
Basophils Relative: 0 %
Eosinophils Absolute: 0 10*3/uL (ref 0.0–0.5)
Eosinophils Relative: 0 %
HCT: 40.5 % (ref 36.0–46.0)
Hemoglobin: 13.7 g/dL (ref 12.0–15.0)
Immature Granulocytes: 2 %
Lymphocytes Relative: 11 %
Lymphs Abs: 1.5 10*3/uL (ref 0.7–4.0)
MCH: 31.8 pg (ref 26.0–34.0)
MCHC: 33.8 g/dL (ref 30.0–36.0)
MCV: 94 fL (ref 80.0–100.0)
Monocytes Absolute: 0.6 10*3/uL (ref 0.1–1.0)
Monocytes Relative: 5 %
Neutro Abs: 10.7 10*3/uL — ABNORMAL HIGH (ref 1.7–7.7)
Neutrophils Relative %: 82 %
Platelets: 287 10*3/uL (ref 150–400)
RBC: 4.31 MIL/uL (ref 3.87–5.11)
RDW: 11.8 % (ref 11.5–15.5)
WBC: 13.1 10*3/uL — ABNORMAL HIGH (ref 4.0–10.5)
nRBC: 0 % (ref 0.0–0.2)

## 2022-01-04 LAB — COMPREHENSIVE METABOLIC PANEL
ALT: 102 U/L — ABNORMAL HIGH (ref 0–44)
AST: 33 U/L (ref 15–41)
Albumin: 4 g/dL (ref 3.5–5.0)
Alkaline Phosphatase: 51 U/L (ref 38–126)
Anion gap: 9 (ref 5–15)
BUN: 16 mg/dL (ref 8–23)
CO2: 29 mmol/L (ref 22–32)
Calcium: 9.1 mg/dL (ref 8.9–10.3)
Chloride: 100 mmol/L (ref 98–111)
Creatinine, Ser: 0.59 mg/dL (ref 0.44–1.00)
GFR, Estimated: >60 mL/min (ref >60)
Glucose, Bld: 112 mg/dL — ABNORMAL HIGH (ref 70–99)
Potassium: 3.7 mmol/L (ref 3.5–5.1)
Sodium: 138 mmol/L (ref 135–145)
Total Bilirubin: 0.9 mg/dL (ref 0.3–1.2)
Total Protein: 6.8 g/dL (ref 6.5–8.1)

## 2022-01-04 MED ORDER — POLYETHYLENE GLYCOL 3350 17 G PO PACK
17.0000 g | PACK | ORAL | Status: DC
  Administered 2022-01-04 – 2022-01-06 (×2): 17 g via ORAL
  Filled 2022-01-04: qty 1

## 2022-01-04 NOTE — Discharge Summary (Signed)
?Physician Discharge Summary ?  ?Patient: [name] MRN: [id number] DOB: [birth year]  ?Admit date:     12/30/2021  ?Discharge date: 01/03/2022  ?Discharge Physician: Jerald Kief A Saryah Loper  ? ?PCP: [name], PA  ? ?Recommendations at discharge:  ? ?Follow up with PCP in 1-2 weeks ?Continue with IV decadron 4 mg IV every 6 hours.  ? ?Discharge Diagnoses: ?Principal Problem: ?  Malignant neoplasm metastatic to brain [facility]) ?Active Problems: ?  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  Malignant neoplasm of upper lobe of right lung ([place]) ?  Falls ?  Hypothyroidism ?  Hypocalcemia ?  Depression ?  Malignant neoplasm of lung metastatic to brain [facility]) ?  Acute encephalopathy ? ?Resolved Problems: ?  * No resolved hospital problems. * ? ?Hospital Course: ?71 year old with past medical history significant for non-small cell lung cancer, COPD, hypothyroidism, who presents to the hospital for being unsteady on her feet and falling.  Subsequent evaluation with  a CT head revealed right hemispheric mass with large volume of vasogenic edema compatible with brain metastasis with a leftward midline shift.  ? ?Radiation oncology, neurosurgery, medical oncology consulted.  Plan is for preoperative history to take radiation treatment in 3 fractions followed by craniotomy for resection.  ? ?Assessment and Plan: ? 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 ?Radiation oncology and neurosurgery recommending 3 treatments of preop stereotactic radiation followed by craniotomy for resection.  Surgery planning for Friday next week. ?-I discussed with Dr. Tammi Klippel, patient can have radiation treatment outpatient. ?-Continue  with IV Decadron 4 mg every 6 hours.   ?Transfer to CIR. She will continue with radiation treatment and subsequent Sx next Friday.  ?  ?Acute encephalopathy ?Suspect related to brain mets.  Patient  confused. ?  ?Depression ?Not currently on medications.  ?  ?Hypocalcemia ?Resolved./  ?  ?Hypothyroidism ?Continue with Synthroid.  ?  ?Falls ?In setting of brain mets.  ?PT following. Transfer to CIR ?  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 ?-on Advair/ at home. Currently on Breo/ellipta.  ?  ?  ?  ?  ?  ? ? ?Consultants: Radiation oncology ?Oncology ?Neurosurgery  ?Procedures performed: none ?Disposition: Rehabilitation facility ?Diet recommendation:  ?Discharge Diet Orders (From admission, onward)  ? ?  Start     Ordered  ? 01/03/22 0000  Diet - low sodium heart healthy       ? 01/03/22 1159  ? ?  ?  ? ?  ? ?Cardiac diet ?DISCHARGE MEDICATION: ?Allergies as of 01/03/2022   ?No Known Allergies ?  ? ?  ?Medication List  ?  ? ?TAKE these medications   ? ?albuterol 108 (90 Base) MCG/ACT inhaler ?Commonly known as: VENTOLIN HFA ?Inhale 2 puffs into the lungs every 4 (four) hours as needed for wheezing or shortness of breath (cough, shortness of breath or wheezing.). ?  ?alendronate 70 MG tablet ?Commonly known as: FOSAMAX ?Take 70 mg by mouth once a week. ?  ?CALCIUM 600 PO ?Take 600 mg by mouth 2 (two) times daily. ?  ?dexamethasone 4 MG/ML injection ?Commonly known as: DECADRON ?Inject 1 mL (4 mg total) into the vein every 6 (six) hours. ?  ?fluticasone 50 MCG/ACT nasal spray ?Commonly known as: FLONASE ?Place 2 sprays into both nostrils daily. ?What changed:  ?how much to take ?when to take this ?  ?  Fluticasone-Salmeterol 250-50 MCG/DOSE Aepb ?Commonly known as: ADVAIR ?Inhale 1 puff into the lungs 2 (two) times daily. ?  ?guaiFENesin 600 MG 12 hr tablet ?Commonly known as: [place] ?Take 1 tablet (600 mg total) by mouth 2 (two) times daily as needed for cough or to loosen phlegm. ?  ?HYDROcodone-acetaminophen 5-325 MG tablet ?Commonly known as: NORCO/VICODIN ?Take 1-2 tablets by mouth every 6 (six) hours as needed for moderate pain. ?  ?levothyroxine 75 MCG tablet ?Commonly known as:  SYNTHROID ?TAKE 1 TABLET BY MOUTH EVERY DAY BEFORE BREAKFAST ?What changed: See the new instructions. ?  ?multivitamin tablet ?Take 1 tablet by mouth daily. ?  ?TYLENOL ARTHRITIS EXT RELIEF PO ?Take 1-2 capsules by mouth daily as needed (pain). ?  ?VITAMIN D3 PO ?Take 2,000 Units by mouth daily. ?  ? ?  ? ? ?Discharge Exam: ?Filed Weights  ? 12/30/21 1800  ?Weight: 56.8 kg  ? ?NAD ?Lung; CTA ? ?Condition at discharge: stable ? ?The results of significant diagnostics from this hospitalization (including imaging, microbiology, ancillary and laboratory) are listed below for reference.  ? ?Imaging Studies: ?DG Chest 2 View ? ?Result Date: 12/30/2021 ?CLINICAL DATA:  71 year old female with lung cancer. Altered mental status and dizziness. 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 Fall. EXAM: CHEST - 2 VIEW COMPARISON:  Chest CT 08/16/2021 and earlier. FINDINGS: Semi upright AP and lateral views of the chest at 1001 hours. Chronic postoperative volume reduction in the right lung appears stable. Stable cardiac size and mediastinal contours. Visualized tracheal air column is within normal limits. No pneumothorax, pulmonary edema, pleural effusion or acute pulmonary opacity identified. No acute osseous abnormality identified. Negative visible bowel gas. IMPRESSION: Post treatment changes to the right lung. No acute cardiopulmonary abnormality or acute traumatic injury identified. Electronically Signed   By: Genevie Ann M.D.   On: 12/30/2021 10:17  ? ?CT HEAD W & WO CONTRAST (5MM) ? ?Addendum Date: 12/30/2021   ?ADDENDUM REPORT: 12/30/2021 10:18 ADDENDUM: Critical Value/emergent results were called by telephone at the time of interpretation on 12/30/2021 At 1015 hours to Dr. Isla Pence , who verbally acknowledged these results. Electronically Signed   By: Genevie Ann M.D.   On: 12/30/2021 10:18  ? ?Result Date: 12/30/2021 ?CLINICAL DATA:  71 year old female with lung cancer. Altered mental status and dizziness. Brain MRI in  2021 was negative for metastatic disease. EXAM: CT HEAD WITHOUT AND WITH CONTRAST TECHNIQUE: Contiguous axial images were obtained from the base of the skull through the vertex without and with intravenous contrast. RADIATION DOSE REDUCTION: This exam was performed according to the departmental dose-optimization program which includes automated exposure control, adjustment of the mA and/or kV according to patient size and/or use of iterative reconstruction technique. CONTRAST:  79mL OMNIPAQUE IOHEXOL 300 MG/ML  SOLN COMPARISON:  Brain MRI 05/18/2020. FINDINGS: Brain: Lobulated, nodular and enhancing mass in the posterior right hemisphere center at the right parieto-occipital sulcus is 34 by 40 x 33 mm (AP by transverse by CC) with a large volume of right hemisphere vasogenic edema. Associated intracranial mass effect with leftward midline shift of up to 13 mm (coronal image 28). Mass effect on the right lateral ventricle and there is mild trapping of the left lateral ventricle now with some transependymal edema on that side suspected. The suprasellar cistern is effaced but other basilar cisterns remain patent. No superimposed acute intracranial hemorrhage, acute cortically based infarct. Following contrast no other enhancing brain metastasis is identified. Vascular: Calcified atherosclerosis at the skull base.  The major intracranial vascular structures appear to be enhancing as expected. Skull: No acute or suspicious osseous lesion identified. Sinuses/Orbits: Visualized paranasal sinuses and mastoids are stable and well aerated. Other: Visualized orbits and scalp soft tissues are within normal limits. IMPRESSION: 1. Relatively large 4 cm enhancing intra-axial mass in the posterior right hemisphere (parieto-occipital sulcus) with a large volume of hemispheric vasogenic edema most compatible with brain metastasis in this setting. 2. Intracranial mass effect with leftward midline shift of up to 13 mm, trapped left  lateral ventricle, and effaced suprasellar cistern. Mild associated transependymal edema. 3. No other brain metastasis identified by CT. Electronically Signed: By: Genevie Ann M.D. On: 12/30/2021 10:10  ? ?CT Cerv

## 2022-01-04 NOTE — Evaluation (Signed)
Occupational Therapy Assessment and Plan ? ?Patient Details  ?Name: [name] ?MRN: [id number] ?Date of Birth: 10-Oct-1950 ? 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 ?Today's Date: 01/04/2022 ?OT Individual Time: 1100-1200 ?OT Individual Time Calculation (min): 60 min    ? ?Hospital Problem: Principal Problem: ?  NSCLC metastatic to brain [facility]) ? ? ?Past Medical History:  ?Past Medical History:  ?Diagnosis Date  ? Cataract   ? COPD (chronic obstructive pulmonary disease) ([place])   ? NSCLC of upper lobe ([place]) 04/2020  ? ?Past Surgical History:  ?Past Surgical History:  ?Procedure Laterality Date  ? APPENDECTOMY  2008  ? COLON SURGERY  2008  ? colostomy-infection post op appendectomy  ? COLOSTOMY REVERSAL  2009  ? INTERCOSTAL NERVE BLOCK Right 10/19/2020  ? Procedure: INTERCOSTAL NERVE BLOCK;  Surgeon: [name], MD;  Location: [place];  Service: Thoracic;  Laterality: Right;  ? NODE DISSECTION Right 10/19/2020  ? Procedure: NODE DISSECTION;  Surgeon: [name], MD;  Location: [place];  Service: Thoracic;  Laterality: Right;  ? VIDEO BRONCHOSCOPY WITH ENDOBRONCHIAL NAVIGATION N/A 05/08/2020  ? Procedure: VIDEO BRONCHOSCOPY WITH ENDOBRONCHIAL NAVIGATION;  Surgeon: [name], MD;  Location: [place];  Service: Thoracic;  Laterality: N/A;  ? VIDEO BRONCHOSCOPY WITH ENDOBRONCHIAL ULTRASOUND N/A 05/08/2020  ? Procedure: VIDEO BRONCHOSCOPY WITH ENDOBRONCHIAL ULTRASOUND;  Surgeon: [name], MD;  Location: [place];  Service: Thoracic;  Laterality: N/A;  ? ? ?Assessment & Plan ?Clinical Impression: [name] is a 71 year old female with history of COPD, Hypothyroidism, depression, recent Covid, NSCLC on maintenance chemo who was admitted to [facility] on 12/30/20 with weakness, dizziness, severe HA and fall with inability to get up. She was found to have solitary right occipital lobe  enhancing lesion with extensive white matter edema and midline shift of 11 mm and remote bilateral cerebellar infarcts with some mass effect on brain stem. Patient had missed last 2 immunotherapy sessions due to Covid + status as well as confusion re: follow up appt. She was started on IV steroids and case discussed with tumoer board re whole brain XRT v/s stereotactic radiosurgery. Patient elected on stereotactic surgery and was started on XRT 01/03/22 with plans for surgical resection on 04/28 by Dr. Ronnald Ramp Patient transferred to [facility] on 01/03/2022 .   ? ?Patient currently requires  CGA  with basic self-care skills and IADL secondary to muscle weakness, decreased visual acuity, decreased motor planning, decreased problem solving and decreased memory, and decreased standing balance and decreased balance strategies.  Prior to hospitalization, patient could complete ADLs with modified independent . ? ?Patient will benefit from skilled intervention to decrease level of assist with basic self-care skills prior to discharge  to acute care for surgery .  Anticipate patient will require 24 hour supervision and  TBD after surgery . ? ?OT - End of Session ?Activity Tolerance: Tolerates 30+ min activity without fatigue ?Endurance Deficit: Yes ?Endurance Deficit Description: generalized weakness ?OT Assessment ?Rehab Potential (ACUTE ONLY): Excellent ?OT Patient demonstrates impairments in the following area(s): Balance;Endurance;Motor;Vision ?OT Basic ADL's Functional Problem(s): Bathing;Dressing;Toileting ?OT Transfers Functional Problem(s): Toilet;Tub/Shower ?OT Additional Impairment(s): None ?OT Plan ?OT Intensity: Minimum of 1-2 x/day, 45 to 90 minutes ?OT Frequency: 5 out of 7 days ?OT Duration/Estimated Length of Stay: 7 days ?OT Treatment/Interventions: Balance/vestibular training;Discharge planning;Self Care/advanced ADL retraining;Therapeutic Activities;UE/LE Coordination activities;Functional mobility  training;Patient/family education;Therapeutic Exercise;UE/LE Strength taining/ROM;Community  reintegration;DME/adaptive equipment instruction;Psychosocial support;Disease mangement/prevention ?OT Self Feeding Anticipated Outcome(s): no goal set ?OT Basic Self-Care Anticipated Outcome(s): mod I ?OT Toileting Anticipated Outcome(s): mod I ?OT Bathroom Transfers Anticipated Outcome(s): mod I ?OT Recommendation ?Patient destination: Home ?Follow Up Recommendations:  (back to acute care) ?Equipment Recommended: To be determined ? ? ?OT Evaluation ?Precautions/Restrictions  ?Precautions ?Precautions: Fall ?Restrictions ?Weight Bearing Restrictions: No ?General ?Chart Reviewed: Yes ?Family/Caregiver Present: No ?Vital Signs ?  ?Pain ?Pain Assessment ?Pain Scale: 0-10 ?Pain Score: 0-No pain ?Home Living/Prior Functioning ?Home Living ?Family/patient expects to be discharged to:: Private residence ?Living Arrangements: Spouse/significant other ?Available Help at Discharge: Family ?Type of Home: House ?Home Access: Stairs to enter ?Entrance Stairs-Number of Steps: 1 or 3 ?Home Layout: One level ?Bathroom Shower/Tub: Tub/shower unit, Walk-in shower ?Bathroom Accessibility: Yes ? Lives With: Spouse ?IADL History ?Homemaking Responsibilities: Yes ?Meal Prep Responsibility: Secondary ?Laundry Responsibility: Secondary ?Cleaning Responsibility: Secondary ?Bill Paying/Finance Responsibility: Secondary ?Shopping Responsibility: Secondary ?Current License: Yes ?Mode of Transportation: Car ?Occupation: Retired ?Type of Occupation: Likes to cross stitch ?Prior Function ?Level of Independence: Independent with basic ADLs, Independent with transfers, Independent with gait ?Driving: Yes ?Vocation: Retired ?Vision ?Baseline Vision/History: 1 Wears glasses ?Ability to See in Adequate Light: 0 Adequate ?Patient Visual Report: Blurring of vision ?Vision Assessment?: Yes ?Eye Alignment: Within Functional Limits ?Ocular Range of Motion: Within  Functional Limits ?Alignment/Gaze Preference: Within Defined Limits ?Tracking/Visual Pursuits: Able to track stimulus in all quads without difficulty ?Saccades: Within functional limits ?Additional Comments: Reports her L visual fields have been blurry, no deficits on formal testing today ?Perception  ?Perception: Within Functional Limits ?Praxis ?Praxis: Impaired ?Praxis Impairment Details: Motor planning ?Praxis-Other Comments: Slight motor planning deficits when dressing but able to self correct ?Cognition ?Cognition ?Overall Cognitive Status: Impaired/Different from baseline ?Arousal/Alertness: Awake/alert ?Orientation Level: Person;Place;Situation ?Person: Oriented ?Place: Oriented ?Situation: Oriented ?Memory: Impaired ?Memory Impairment: Decreased short term memory ?Decreased Short Term Memory: Functional basic ?Attention: Selective ?Selective Attention: Appears intact ?Awareness: Appears intact ?Problem Solving: Appears intact ?Safety/Judgment: Appears intact ?Comments: Slight motor planning deficits during dressing ?Brief Interview for Mental Status (BIMS) ?Repetition of Three Words (First Attempt): 3 ?Temporal Orientation: Year: Correct ?Temporal Orientation: Month: Accurate within 5 days ?Temporal Orientation: Day: Correct ?Recall: "Sock": Yes, no cue required ?Recall: "Blue": Yes, no cue required ?Recall: "Bed": No, could not recall ?BIMS Summary Score: 13 ?Sensation ?Sensation ?Light Touch: Appears Intact ?Hot/Cold: Appears Intact ?Coordination ?Gross Motor Movements are Fluid and Coordinated: No ?Fine Motor Movements are Fluid and Coordinated: Yes ?Coordination and Movement Description: generalized weakness, mild L inattention ?Motor  ?Motor ?Motor: Other (comment);Ataxia ?Motor - Skilled Clinical Observations: Generalized weakness and slightly ataxic gait with L foot scissoring occasionally  ?Trunk/Postural Assessment  ?Cervical Assessment ?Cervical Assessment: Within Functional Limits ?Thoracic  Assessment ?Thoracic Assessment: Within Functional Limits ?Lumbar Assessment ?Lumbar Assessment: Within Functional Limits ?Postural Control ?Postural Control: Deficits on evaluation ?Righting Reactions: delayed  ?Balance ?[place]

## 2022-01-04 NOTE — Progress Notes (Signed)
The patient is injury-free, afebrile, alert, and oriented X 3. Vital signs were within the baseline during this shift. Pt denies chest pain, SOB, nausea, vomiting, dizziness, signs or symptoms of bleeding or infection, or acute changes during this shift. We will continue to monitor and work toward achieving the care plan goals. ?

## 2022-01-04 NOTE — Telephone Encounter (Signed)
.  Called patient to schedule appointment per 4/20 inbasket, patient is aware of date and time.   ?

## 2022-01-04 NOTE — Evaluation (Signed)
Speech Language Pathology Assessment and Plan ? ?Patient Details  ?Name: [name] ?MRN: [id number] ?Date of Birth: 07/26/1951 ? ?SLP Diagnosis: Cognitive Impairments  ?Rehab Potential: Excellent ?ELOS: 1 week and then discharge to surgery  ? ? ?Today's Date: 01/04/2022 ?SLP Individual Time: 4098-1191 ?SLP Individual Time Calculation (min): 60 min ? ? ?Hospital Problem: Principal Problem: ?  NSCLC metastatic to brain [facility]) ? ?Past Medical History:  ?Past Medical History:  ?Diagnosis Date  ? Cataract   ? COPD (chronic obstructive pulmonary disease) ([place])   ? NSCLC of upper lobe ([place]) 04/2020  ? ?Past Surgical History:  ?Past Surgical History:  ?Procedure Laterality Date  ? APPENDECTOMY  2008  ? COLON SURGERY  2008  ? colostomy-infection post op appendectomy  ? COLOSTOMY REVERSAL  2009  ? INTERCOSTAL NERVE BLOCK Right 10/19/2020  ? Procedure: INTERCOSTAL NERVE BLOCK;  Surgeon: [name], MD;  Location: [place];  Service: Thoracic;  Laterality: Right;  ? NODE DISSECTION Right 10/19/2020  ? Procedure: NODE DISSECTION;  Surgeon: [name], MD;  Location: [place];  Service: Thoracic;  Laterality: Right;  ? VIDEO BRONCHOSCOPY WITH ENDOBRONCHIAL NAVIGATION N/A 05/08/2020  ? Procedure: VIDEO BRONCHOSCOPY WITH ENDOBRONCHIAL NAVIGATION;  Surgeon: [name], MD;  Location: [place];  Service: Thoracic;  Laterality: N/A;  ? VIDEO BRONCHOSCOPY WITH ENDOBRONCHIAL ULTRASOUND N/A 05/08/2020  ? Procedure: VIDEO BRONCHOSCOPY WITH ENDOBRONCHIAL ULTRASOUND;  Surgeon: [name], MD;  Location: [place];  Service: Thoracic;  Laterality: N/A;  ? ? ?Assessment / Plan / Recommendation ?Clinical Impression Patient is a 71 y/o female with PMH of COPD, hypothyroidism, and NSCLC who presents to [facility] on 4/16 with several week history of unsteadiness and falls, as well as headaches that were not responsive to OTC medication.   ED workup revealed labs and vitals WNL.  CT head revealed large 4 cm engancing intra-axial  mass in the posterior right hemisphere (parietoccipital sulcus) with a large volume of hemispheric vasogenic edema, most compatible with brain metastasis, intracranial mass effect with leftward midline shift up to 50m, trapped left lateral ventricle, and effaced suprasellar cistern.  Neurosurgery, oncology, and rad-onc consulted.  Recommended to start decadron protocol, radiation treatment in 3 fractions followed by craniotomy for resection.  Pt with intermittent confusion. Tolerating regular diet.  Therapy ongoing and recommendations are for CIR to maximize independence prior to surgery, then return to CIR to reduce caregiver burden and maximize functional independence with planned return home with family support.  Patient admitted 01/03/22. ? ?SLP administered the Cognistat. Patient scored WFL on all subtests with the exception of mild impairments in orientation. Throughout informal evaluation, decreased short-term recall of functional information was evident with mild left inattention noted during functional tasks. Patient's overall functional communication appeared [facility] assessed. Patient would benefit from skilled SLP intervention to maximize her overall cognitive functioning prior to discharge.  ? ?   ?Skilled Therapeutic Interventions          Administered a cognitive-linguistic evaluation, please see above for details. Throughout session, SLP provided Min verbal cues for patient to utilize external aids for recall of functional information, especially regarding dates of services, etc. Patient left upright in bed with alarm on and all needs within reach. Continue with current plan of care.  ?  ?SLP Assessment ? Patient will need skilled Speech Lanaguage Pathology Services during CIR admission  ?  ?Recommendations ? Oral Care Recommendations: Oral care BID ?Recommendations for Other Services: Neuropsych consult ?Patient destination:  (  back to actue for surgery) ?Follow up Recommendations:  (TBD  after surgery) ?Equipment Recommended: None recommended by SLP  ?  ?SLP Frequency 3 to 5 out of 7 days   ?SLP Duration ? ?SLP Intensity ? ?SLP Treatment/Interventions 1 week and then discharge to surgery ? ?Minumum of 1-2 x/day, 30 to 90 minutes ? ?Cognitive remediation/compensation;Internal/external aids;Therapeutic Activities;Environmental controls;Cueing hierarchy;Functional tasks;Patient/family education   ? ?Pain ?Pain Assessment ?Pain Scale: 0-10 ?Pain Score: 0-No pain ? ?Prior Functioning ?Type of Home: House ? Lives With: Spouse ?Available Help at Discharge: Family ?Vocation: Retired ? ?SLP Evaluation ?Cognition ?Overall Cognitive Status: Impaired/Different from baseline ?Arousal/Alertness: Awake/alert ?Orientation Level: Oriented X4 ?Attention: Selective ?Selective Attention: Appears intact ?Memory: Impaired ?Memory Impairment: Decreased short term memory ?Decreased Short Term Memory: Functional basic ?Awareness: Appears intact ?Problem Solving: Appears intact ?Safety/Judgment: Appears intact  ?Comprehension ?Auditory Comprehension ?Overall Auditory Comprehension: Appears within functional limits for tasks assessed ?Expression ?Expression ?Primary Mode of Expression: Verbal ?Verbal Expression ?Overall Verbal Expression: Appears within functional limits for tasks assessed ?Written Expression ?Dominant Hand: Right ?Oral Motor ?Oral Motor/Sensory Function ?Overall Oral Motor/Sensory Function: Within functional limits ?Motor Speech ?Overall Motor Speech: Appears within functional limits for tasks assessed ? ?Care Tool ?Care Tool Cognition ?Ability to hear (with hearing aid or hearing appliances if normally used Ability to hear (with hearing aid or hearing appliances if normally used): 0. Adequate - no difficulty in normal conservation, social interaction, listening to TV ?  ?Expression of Ideas and Wants Expression of Ideas and Wants: 4. Without difficulty (complex and basic) - expresses complex messages  without difficulty and with speech that is clear and easy to understand ?  ?Understanding Verbal and Non-Verbal Content Understanding Verbal and Non-Verbal Content: 4. Understands (complex and basic) - clear comprehension without cues or repetitions  ?Memory/Recall Ability Memory/Recall Ability : Current season;Location of own room;Staff names and faces;That he or she is in a hospital/hospital unit  ? ?Short Term Goals: ?Week 1: SLP Short Term Goal 1 (Week 1): STGs=LTGs due to ELOS ? ?Refer to Care Plan for Long Term Goals ? ?Recommendations for other services: Neuropsych ? ?Discharge Criteria: Patient will be discharged from SLP if patient refuses treatment 3 consecutive times without medical reason, if treatment goals not met, if there is a change in medical status, if patient makes no progress towards goals or if patient is discharged from hospital. ? ?The above assessment, treatment plan, treatment alternatives and goals were discussed and mutually agreed upon: by patient ? ?Takota Cahalan ?01/04/2022, 12:38 PM ? ? ?

## 2022-01-04 NOTE — Plan of Care (Signed)
?  Problem: Consults ?Goal: RH BRAIN INJURY PATIENT EDUCATION ?Description: Description: See Patient Education module for eduction specifics ?Outcome: Progressing ?  ?Problem: RH BOWEL ELIMINATION ?Goal: RH STG MANAGE BOWEL WITH ASSISTANCE ?Description: STG Manage Bowel with Supervision Assistance. ?Outcome: Progressing ?Goal: RH STG MANAGE BOWEL W/MEDICATION W/ASSISTANCE ?Description: STG Manage Bowel with Medication with Supervision Assistance. ?Outcome: Progressing ?  ?Problem: RH SKIN INTEGRITY ?Goal: RH STG MAINTAIN SKIN INTEGRITY WITH ASSISTANCE ?Description: STG Maintain Skin Integrity With Supervision Assistance. ?Outcome: Progressing ?Goal: RH STG ABLE TO PERFORM INCISION/WOUND CARE W/ASSISTANCE ?Description: STG Able To Perform Incision/Wound Care With Supervision Assistance. ?Outcome: Progressing ?  ?Problem: RH SAFETY ?Goal: RH STG ADHERE TO SAFETY PRECAUTIONS W/ASSISTANCE/DEVICE ?Description: STG Adhere to Safety Precautions With Cues and Reminders. ?Outcome: Progressing ?Goal: RH STG DECREASED RISK OF FALL WITH ASSISTANCE ?Description: STG Decreased Risk of Fall With Supervision Assistance. ?Outcome: Progressing ?  ?Problem: RH COGNITION-NURSING ?Goal: RH STG USES MEMORY AIDS/STRATEGIES W/ASSIST TO PROBLEM SOLVE ?Description: STG Uses Memory Aids/Strategies With Supervision Assistance to Problem Solve. ?Outcome: Progressing ?Goal: RH STG ANTICIPATES NEEDS/CALLS FOR ASSIST W/ASSIST/CUES ?Description: STG Anticipates Needs/Calls for Assist With Cues and Reminders. ?Outcome: Progressing ?  ?Problem: RH KNOWLEDGE DEFICIT BRAIN INJURY ?Goal: RH STG INCREASE KNOWLEDGE OF SELF CARE AFTER BRAIN INJURY ?Description: Patient will demonstrate knowledge of self-care management, bowel management, and safety precautions with educational materials and handouts provided by staff independently at discharge. ?Outcome: Progressing ?  ?

## 2022-01-04 NOTE — Evaluation (Signed)
Physical Therapy Assessment and Plan ? ?Patient Details  ?Name: [name] ?MRN: [id number] ?Date of Birth: 1951/07/07 ? ?PT Diagnosis: Abnormality of gait, Ataxic gait, Coordination disorder, and Impaired cognition ?Rehab Potential: Excellent ?ELOS: 7 days  ? ?Today's Date: 01/04/2022 ?PT Individual Time: 6712-4580 ?PT Individual Time Calculation (min): 60 min   ? ?Hospital Problem: Principal Problem: ?  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 ? ?Past Medical History:  ?Past Medical History:  ?Diagnosis Date  ? Cataract   ? COPD (chronic obstructive pulmonary disease) ([place])   ? NSCLC of upper lobe ([place]) 04/2020  ? ?Past Surgical History:  ?Past Surgical History:  ?Procedure Laterality Date  ? APPENDECTOMY  2008  ? COLON SURGERY  2008  ? colostomy-infection post op appendectomy  ? COLOSTOMY REVERSAL  2009  ? INTERCOSTAL NERVE BLOCK Right 10/19/2020  ? Procedure: INTERCOSTAL NERVE BLOCK;  Surgeon: [name], MD;  Location: [place];  Service: Thoracic;  Laterality: Right;  ? NODE DISSECTION Right 10/19/2020  ? Procedure: NODE DISSECTION;  Surgeon: [name], MD;  Location: [place];  Service: Thoracic;  Laterality: Right;  ? VIDEO BRONCHOSCOPY WITH ENDOBRONCHIAL NAVIGATION N/A 05/08/2020  ? Procedure: VIDEO BRONCHOSCOPY WITH ENDOBRONCHIAL NAVIGATION;  Surgeon: [name], MD;  Location: [place];  Service: Thoracic;  Laterality: N/A;  ? VIDEO BRONCHOSCOPY WITH ENDOBRONCHIAL ULTRASOUND N/A 05/08/2020  ? Procedure: VIDEO BRONCHOSCOPY WITH ENDOBRONCHIAL ULTRASOUND;  Surgeon: [name], MD;  Location: [place];  Service: Thoracic;  Laterality: N/A;  ? ? ?Assessment & Plan ?Clinical Impression: [name] is a 71 year old female with history of COPD, Hypothyroidism, depression, recent Covid, NSCLC on maintenance chemo who was admitted to [facility] on 12/30/20 with weakness, dizziness, severe HA and fall with inability to get up. She was found to have solitary right occipital lobe enhancing lesion  with extensive white matter edema and midline shift of 11 mm and remote bilateral cerebellar infarcts with some mass effect on brain stem. Patient had missed last 2 immunotherapy sessions due to Covid + status as well as confusion re: follow up appt. She was started on IV steroids and case discussed with tumoer board re whole brain XRT v/s stereotactic radiosurgery. Patient elected on stereotactic surgery and was started on XRT 01/03/22 with plans for surgical resection on 04/28 by Dr. Ronnald Ramp Patient transferred to [facility] on 01/03/2022 .     ? ?Patient currently requires min with mobility secondary to muscle weakness, ataxia and decreased motor planning, and decreased standing balance and decreased balance strategies.  Prior to hospitalization, patient was independent  with mobility and lived with Spouse in a House home.  Home access is 1 or 3Stairs to enter. ? ?Patient will benefit from skilled PT intervention to maximize safe functional mobility, minimize fall risk, and decrease caregiver burden for planned discharge home with 24 hour supervision.  Anticipate patient will benefit from follow up OP at discharge. ? ?PT - End of Session ?Activity Tolerance: Tolerates 30+ min activity with multiple rests ?Endurance Deficit: Yes ?Endurance Deficit Description: generalized weakness ?PT Assessment ?Rehab Potential (ACUTE/IP ONLY): Excellent ?PT Barriers to Discharge: Inaccessible home environment;Pending surgery ?PT Patient demonstrates impairments in the following area(s): Balance;Perception;Safety;Motor ?PT Transfers Functional Problem(s): Bed Mobility;Bed to Chair;Car;Furniture ?PT Locomotion Functional Problem(s): Ambulation;Stairs ?PT Plan ?PT Intensity: Minimum of 1-2 x/day ,45 to 90 minutes ?PT Frequency: 5 out of 7 days ?PT Duration Estimated Length of Stay: 7 days ?PT Treatment/Interventions: Ambulation/gait training;Cognitive remediation/compensation;Discharge planning;DME/adaptive equipment instruction;Functional  mobility  training;Pain management;Psychosocial support;Splinting/orthotics;Therapeutic Activities;UE/LE Strength taining/ROM;Visual/perceptual remediation/compensation;Wheelchair propulsion/positioning;UE/LE Coordination activities;Therapeutic Exercise;Stair training;Skin care/wound management;Patient/family education;Neuromuscular re-education;Functional electrical stimulation;Disease management/prevention;Community reintegration;Balance/vestibular training ?PT Transfers Anticipated Outcome(s): mod I ?PT Locomotion Anticipated Outcome(s): Mod I ?PT Recommendation ?Follow Up Recommendations: Outpatient PT ?Patient destination: Home ?Equipment Recommended: Rolling walker with 5" wheels ? ? ?PT Evaluation ?Precautions/Restrictions ?Precautions ?Precautions: Fall ?Restrictions ?Weight Bearing Restrictions: No ?General ?  Vital SignsTherapy Vitals ?Temp: 98.3 ?F (36.8 ?C) ?Temp Source: Oral ?Pulse Rate: 63 ?Resp: 16 ?BP: (!) 127/55 ?Patient Position (if appropriate): Lying ?Oxygen Therapy ?SpO2: 97 % ?O2 Device: Room Air ?Pain ?Pain Assessment ?Pain Score: 1  ?Pain Interference ?Pain Interference ?Pain Effect on Sleep: 2. Occasionally ?Pain Interference with Therapy Activities: 1. Rarely or not at all ?Pain Interference with Day-to-Day Activities: 2. Occasionally ?Home Living/Prior Functioning ?Home Living ?Available Help at Discharge: Family;Available 24 hours/day;Friend(s) (Sister from [place] in to be available at d/c, SIL and niece live locally, friends available) ?Type of Home: House ?Home Access: Stairs to enter ?Entrance Stairs-Number of Steps: 1 or 3 ?Entrance Stairs-Rails: Can reach both ?Home Layout: One level ? Lives With: Spouse ?Prior Function ?Level of Independence: Independent with gait;Independent with homemaking with ambulation;Independent with transfers ? Able to Take Stairs?: Yes ?Driving: Yes ?Vocation: Retired ?Leisure: Hobbies-yes (Comment) (house work, hard work) ?Vision/Perception  ?Vision -  History ?Ability to See in Adequate Light: 0 Adequate ?Vision - Assessment ?Eye Alignment: Within Functional Limits ?Ocular Range of Motion: Within Functional Limits ?Alignment/Gaze Preference: Within Defined Limits ?Tracking/Visual Pursuits: Able to track stimulus in all quads without difficulty ?Saccades: Within functional limits ?Perception ?Perception: Impaired ?Inattention/Neglect: Does not attend to left visual field (mild) ?Praxis ?Praxis: Impaired ?Praxis Impairment Details: Motor planning  ?Cognition ?Overall Cognitive Status: Impaired/Different from baseline ?Arousal/Alertness: Awake/alert ?Orientation Level: Oriented X4 ?Year: 2023 ?Month: April ?Day of Week: Correct ?Sensation ?Sensation ?Light Touch: Appears Intact ?Hot/Cold: Appears Intact ?Proprioception: Appears Intact ?Stereognosis: Not tested ?Coordination ?Gross Motor Movements are Fluid and Coordinated: No ?Fine Motor Movements are Fluid and Coordinated: Yes ?Coordination and Movement Description: generalized weakness, mild L inattention ?Motor  ?Motor ?Motor: Other (comment);Ataxia ?Motor - Skilled Clinical Observations: Generalized weakness and slightly ataxic gait with L foot scissoring occasionally  ? ?Trunk/Postural Assessment  ?Cervical Assessment ?Cervical Assessment: Within Functional Limits ?Thoracic Assessment ?Thoracic Assessment: Within Functional Limits ?Lumbar Assessment ?Lumbar Assessment: Within Functional Limits ?Postural Control ?Postural Control: Deficits on evaluation ?Righting Reactions: delayed  ?Balance ?Balance ?Balance Assessed: Yes ?Dynamic Sitting Balance ?Dynamic Sitting - Balance Support: Feet supported ?Dynamic Sitting - Level of Assistance: 6: Modified independent (Device/Increase time) ?Sitting balance - Comments: Pt sitting in recliner with feet supported without BUE support ?Dynamic Standing Balance ?Dynamic Standing - Balance Support: During functional activity ?Dynamic Standing - Level of Assistance: 4: Min  assist ?Extremity Assessment  ?  ?  ?RLE Assessment ?RLE Assessment: Within Functional Limits ?General Strength Comments: Grossly 4+/5 ?LLE Assessment ?LLE Assessment: Within Functional Limits ?General Strengt

## 2022-01-04 NOTE — Progress Notes (Signed)
Inpatient Rehabilitation  Patient information reviewed and entered into eRehab system by Jaivion Kingsley M. Dontrez Pettis, M.A., CCC/SLP, PPS Coordinator.  Information including medical coding, functional ability and quality indicators will be reviewed and updated through discharge.    

## 2022-01-04 NOTE — Progress Notes (Signed)
?                                                       PROGRESS NOTE ? ? ?Subjective/Complaints: ? ?Slept OK- no BM yet- feeling constipated- ready for therapy today.  ? ? ?ROS: ? ?Pt denies SOB, abd pain, CP, N/V/C/D, and vision changes ? ? ?Objective: ?  ?No results found. ?Recent Labs  ?  01/04/22 ?0533  ?WBC 13.1*  ?HGB 13.7  ?HCT 40.5  ?PLT 287  ? ?Recent Labs  ?  01/04/22 ?0533  ?NA 138  ?K 3.7  ?CL 100  ?CO2 29  ?GLUCOSE 112*  ?BUN 16  ?CREATININE 0.59  ?CALCIUM 9.1  ? ? ?Intake/Output Summary (Last 24 hours) at 01/04/2022 1458 ?Last data filed at 01/04/2022 1317 ?Gross per 24 hour  ?Intake 330 ml  ?Output --  ?Net 330 ml  ?  ? ?  ? ?Physical Exam: ?Vital Signs ?Blood pressure 93/61, pulse 63, temperature 98.4 ?F (36.9 ?C), temperature source Oral, resp. rate 18, height 5' 2.5" (1.588 m), weight 51.6 kg, SpO2 96 %. ? ? ?General: awake, alert, appropriate, sitting up in bed; eating breakfast; NAD ?HENT: conjugate gaze; oropharynx moist ?CV: regular rate; no JVD ?Pulmonary: CTA B/L; no W/R/R- good air movement ?GI: soft, NT, ND, (+)BS ?Psychiatric: appropriate ?Neurological: Ox3- slightly delayed processing ?Musculoskeletal:  ?   Cervical back: Neck supple. No tenderness.  ?   Comments: 5-/5 throughout strength B.L   ?Skin: ?   General: Skin is warm and dry.  ?   Comments: 2 IV's L AC fossa and R forearm- otherwise, no skin breakdown  ?Neurological:  ?   Mental Status: She is alert and oriented to person, place, and time.  ?   Comments: Speech clear. Alert and appropriate. She ws asble to follow basic one an two step commands without difficulty.  ?Ox3- except didn't know next holiday was memorial day  ? ? ?Assessment/Plan: ?1. Functional deficits which require 3+ hours per day of interdisciplinary therapy in a comprehensive inpatient rehab setting. ?Physiatrist is providing close team supervision and 24 hour management of active medical problems listed below. ?Physiatrist and rehab team continue to assess  barriers to discharge/monitor patient progress toward functional and medical goals ? ?Care Tool: ? ?Bathing ?   ?Body parts bathed by patient: Right arm, Right lower leg, Left arm, Left lower leg, Chest, Abdomen, Face, Front perineal area, Buttocks, Right upper leg, Left upper leg  ?   ?  ?  ?Bathing assist Assist Level: Contact Guard/Touching assist ?  ?  ?Upper Body Dressing/Undressing ?Upper body dressing   ?What is the patient wearing?: Pull over shirt, Bra ?   ?Upper body assist Assist Level: Supervision/Verbal cueing ?   ?Lower Body Dressing/Undressing ?Lower body dressing ? ? ?   ?What is the patient wearing?: Underwear/pull up, Pants ? ?  ? ?Lower body assist Assist for lower body dressing: Contact Guard/Touching assist ?   ? ?Toileting ?Toileting    ?Toileting assist Assist for toileting: Contact Guard/Touching assist ?  ?  ?Transfers ?Chair/bed transfer ? ?Transfers assist ?   ? ?Chair/bed transfer assist level: Contact Guard/Touching assist ?  ?  ?Locomotion ?Ambulation ? ? ?Ambulation assist ? ?   ? ?  ?  ?   ? ?Walk 10 feet activity ? ? ?  Assist ?   ? ?  ?   ? ?Walk 50 feet activity ? ? ?Assist   ? ?  ?   ? ? ?Walk 150 feet activity ? ? ?Assist   ? ?  ?  ?  ? ?Walk 10 feet on uneven surface  ?activity ? ? ?Assist   ? ? ?  ?   ? ?Wheelchair ? ? ? ? ?Assist   ?  ?  ? ?  ?   ? ? ?Wheelchair 50 feet with 2 turns activity ? ? ? ?Assist ? ?  ?  ? ? ?   ? ?Wheelchair 150 feet activity  ? ? ? ?Assist ?   ? ? ?   ? ?Blood pressure 93/61, pulse 63, temperature 98.4 ?F (36.9 ?C), temperature source Oral, resp. rate 18, height 5' 2.5" (1.588 m), weight 51.6 kg, SpO2 96 %. ? ?Medical Problem List and Plan: ?1. 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            -patient may  shower ?            -ELOS/Goals: 7-10 days mod I to supervision ? 4/21- First day of evaluations- Con't CIR_ PT, and OT and SLP ?2.  Antithrombotics: ?-DVT/anticoagulation:  Mechanical: Sequential compression devices, below knee  Bilateral lower extremities ?            -antiplatelet therapy: N/A ?3. Pain Management: Tylenol prn. Reports HA have resolved ?4. Mood: LCSW to follow for evaluation and support.  ?            -antipsychotic agents: N/a ?5. Neuropsych: This patient may be intermittently capable of making decisions on her own behalf. ?6. Skin/Wound Care: Routine pressure relief measuress.  ?7. Fluids/Electrolytes/Nutrition: Monitor I/O. Check CMET in am.  ?8. 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            --on decadron 4 mg every 6 hours for midline shift/cerebral edema.  ?9. Lung cancer/COPD: On maintenance immunotherapy every 3 weeks of Atezolizumab              --Breo/ellipta bid ?10. Constipation: Will start  miralax daily ? 4/21- didn't get miralax- hasn't had BM- will give Miralax QOD- per her home regimen ?11. Leucocytosis: Likely steroid effect.  ?--Will monitor for fevers and other signs of infection.  ?  4/21- WBC is likely due to steroids- afebrile- no illness Sx's.  ?  ? ?LOS: ?1 days ?A FACE TO FACE EVALUATION WAS PERFORMED ? ?[name] ?01/04/2022, 2:58 PM  ? ? ? ?

## 2022-01-05 NOTE — Progress Notes (Signed)
Occupational Therapy Session Note ? ?Patient Details  ?Name: [name] ?MRN: [id number] ?Date of Birth: [birth year] ? ?Today's Date: 01/05/2022 ?OT Individual Time: 7096-2836 ?OT Individual Time Calculation (min): 60 min  ? ? ?Short Term Goals: ?Week 1:  OT Short Term Goal 1 (Week 1): STG=LTG d/t ELOS ? ?Skilled Therapeutic Interventions/Progress Updates:  ?  Pt completed dressing, toileting, and grooming tasks during session.  Min guard for functional mobility in the room over to gather washcloth for washing her face in standing with close supervision for completion or oral hygiene and washing her face.  She was able to complete toilet transfers to the 3:1 over the toilet at min guard as well with occasional LOB but able to correct without assist.  Once dressing was completed at supervision level, she ambulated out in the hallway without an assistive device with occasional min assist while having to scan and locate items that were the color green.  Noted decreased speed of mobility with min instructional cueing initially to locate items on the far left side.  This improved some as session progressed, however gait speed remained slow while having to divide her attention.  Finished session with return to the room and transition back to the bed where she stayed to rest.  Call button and phone in reach with safety alarm in place.   ? ?Therapy Documentation ?Precautions:  ?Precautions ?Precautions: Fall ?Restrictions ?Weight Bearing Restrictions: Yes ? ?Pain: ?Pain Assessment ?Pain Scale: 0-10 ?Pain Score: 0-No pain ?ADL: ?See Care Tool Section for some details of mobility and selfcare ? ? ?Therapy/Group: Individual Therapy ? ?Solina Heron OTR/L ?01/05/2022, 12:30 PM ?

## 2022-01-05 NOTE — Progress Notes (Signed)
Speech Language Pathology Daily Session Note ? ?Patient Details  ?Name: [name] ?MRN: [id number] ?Date of Birth: Mar 17, 1951 ? ?Today's Date: 01/05/2022 ?SLP Individual Time: 1105-1200 ?SLP Individual Time Calculation (min): 55 min ? ?Short Term Goals: ?Week 1: SLP Short Term Goal 1 (Week 1): STGs=LTGs due to ELOS ? ?Skilled Therapeutic Interventions: Skilled treatment session focused on cognitive goals. SLP facilitated session by providing a calendar for patient to record past and future radiation appointments as patient consistently confusing dates and asking for clarification. Patient completed task with Min verbal cues for recall of information. SLP also provided the patient with a pad and pen for use of an external memory aid per her request. SLP also facilitated session by providing Min verbal cues for problem solving and organization during a basic money management task. Patient left upright in bed with alarm on and all needs within reach. Continue with current plan of care.  ?   ? ?Pain ?No/Denies Pain  ? ?Therapy/Group: Individual Therapy ? ?Eland Lamantia ?01/05/2022, 12:26 PM ?

## 2022-01-05 NOTE — Progress Notes (Signed)
Physical Therapy Session Note ? ?Patient Details  ?Name: [name] ?MRN: [id number] ?Date of Birth: Sep 22, 1950 ? ?Today's Date: 01/05/2022 ?PT Individual Time: 1025-8527 and 7824-2353 ?PT Individual Time Calculation (min): 64 min and 25 min ? ?Short Term Goals: ?Week 1:  PT Short Term Goal 1 (Week 1): =LTGs d/t ELOS ? ?Skilled Therapeutic Interventions/Progress Updates:  ?  Session 1: Pt received supported long sitting in bed and agreeable to therapy session. Pt reports her main concerns are short term memory recall and balance. Supine>sitting L EOB, HOB flat and not using bedrail, supervision. Sit<>stands, no AD, with CGA for steadying progressing to supervision during session. Gait training ~233ft, no AD, with primarily CGA but intermittent min assist due to minor R/L LOB requiring assist to stabilize - does continue to have L inattention requiring cuing to see the open therapy gym door. Pt does report she had R/L lateral swaying prior to being in the hospital.  ? ?Dynamic gait training through agility ladder: ?- forward reciprocal stepping  ?- side stepping  ?Continues to require CGA with intermittent light min assist for balance recovery.  ? ?Dynamic stepping balance task of R/L lateral stepping over hockey stick while holding 4lb dumbbell weight and then rotating trunk to tap the weight on a table x12 reps - CGA/light min assist for steadying with no overt LOB. ? ?Standing squats tapping hips on mat while holding 4lb weight 2x8 reps - CGA for steadying and cuing for proper form/technique with wider BOS to increase hip abductor activation. ? ?Dynamic stepping forward/backwards up/down on/off ~6" step without UE support 2x12 reps leading with each LE - requires consistent min assist for balance with this especially when stepping back down off step due to balance instability with minor posterior LOB.  ? ?Dynamic sideways stepping on/off across a 6" step without UE support x10 reps in each direction with min  assist for balance especially when stepping off towards L due to minor L posterior LOB.  ? ?Stair navigation training ascending/descending 4 (6" height) steps using B HRs with CGA for safety - pt self selecting reciprocal pattern. ? ?Dynamic gait training with dual-cognitive-task of locating numbered disks 2-10 in ascending order of evens first followed by odds - requires mod verbal cuing to recall the task. Transitioned on locating the targets based on colors in an order specified by pt but having various amounts of each color to require pt to visually scan to find all of one color prior to looking for the next - pt overall has a hard time visually scanning the environment to locate the disks requiring mod cuing. ? ?Gait back to room, no AD, with CGA for safety, but no noticeable LOB at this time and pt reporting she feels more stable on her feet. ? ?At end of session, pt left supine in bed with needs in reach and bed alarm on.  ? ? ? ?Session 2: Pt received supported long sitting in bed with NT exiting upon therapist arrival and pt agreeable to therapy session. Transitioned to sitting EOB with supervision. Sit<>stands, no AD, with close supervision for safety during session. Gait training ~137ft to ortho gym, no AD, with CGA for steadying - no noticeable instances of lateral LOB at this time.  ? ?Dual-task training of standing while participating in visual scanning and cognitive tasks using BITs system as follows: ?- Bell Cancellation Test: pt noticed to have an organized scanning technique and recalls need to attend to L side of the screen without  cuing but requires 3 laps around the screen to locate all bells - requires 13min51sec to complete task ?- Rotating Visual Pursuits to identify [place] letters in alphabetical order on scanning speed of 6 - requires 79min to complete task with average 4.65sec response time- pt does require min cuing to recall correct sequence of alphabet ?- Visual Scanning Task of  alternating letter then number to locate 16 targets total - requires 42 seconds to complete - pt does have to say sequence aloud to recall current target she is on and the next one to identify ? ?Pt able to maintain standing balance on firm surface with supervision during this task. ? ?Gait training back to room as described above. Pt left supported long sitting in bed with needs in reach and bed alarm on. ? ?Therapy Documentation ?Precautions:  ?Precautions ?Precautions: Fall ?Restrictions ?Weight Bearing Restrictions: Yes ? ? ?Pain: ?Session 1: "Nothing out of the norm...just my arthritis." No intervention needed during session. ? ?Session 2: No reports of pain throughout session. ? ? ? ?Therapy/Group: Individual Therapy ? ?Tawana Scale , PT, DPT, NCS, CSRS ?01/05/2022, 12:36 PM  ?

## 2022-01-06 NOTE — Progress Notes (Signed)
Occupational Therapy Session Note ? ?Patient Details  ?Name: [name] ?MRN: [id number] ?Date of Birth: [birth year] ? ?Today's Date: 01/06/2022 ?OT Individual Time: 1415-1530 ?OT Individual Time Calculation (min): 75 min  ? ? ?Short Term Goals: ?Week 1:  OT Short Term Goal 1 (Week 1): STG=LTG d/t ELOS ? ?Skilled Therapeutic Interventions/Progress Updates:  ?  1:1 Pt received in bed. Self care retraining at shower level with focus on navigating around the room with supervision without a AD. Pt able to shower in standing with supervision today. Pt stood to doff and donn underwear with contact guard to supervision for standing balance. Pt able to reach down into lower drawers with supervision to obtain items needed. Pt participated in activities to further challenge her endurance and balance. In the room ambulated backwards ~ 20 feet with narrow BOS with contact guard. Pt with difficulty with walking in tandem and often need to take a side step correct balance. Also performed "the grapevine" requiring contact guard for coordinating steps and navigating balance with narrow BOS. Pt also performed UB circuit training with 4 lb weighted bar for overall conditioning.  ? ?Pt ambulated from her room out to the main entrance of the hospital without AD. Pt able to complete task of ambulating that far without difficulty with endurance; however with more wider open spaces pt listed to the left and had a few LOB requiring min A to correct. Discussed possibility of using a RW for longer distances.  Discussed d/c and life after hospital outside and then ambulated back to her room with contact guard. ? ?Pt able to into bed mod I. Left resting in the bed with call bell and alarm set.  ? ?Therapy Documentation ?Precautions:  ?Precautions ?Precautions: Fall ?Restrictions ?Weight Bearing Restrictions: Yes ? ?Pain: ?No reports of pain in session  ? ? ?Therapy/Group: Individual Therapy ? ?Nicoletta Ba ?01/06/2022, 4:00 PM ?

## 2022-01-06 NOTE — Progress Notes (Signed)
?                                                       PROGRESS NOTE ? ? ?Subjective/Complaints: ? ?Pt on phone with family- ?Exhausted yesterday after therapy- happy has some rest time and only 1 therapy today, but does want to get/moving.  ? ?No issues.  ? ?ROS: ? ?Pt denies SOB, abd pain, CP, N/V/C/D, and vision changes ? ?Objective: ?  ?No results found. ?Recent Labs  ?  01/04/22 ?0533  ?WBC 13.1*  ?HGB 13.7  ?HCT 40.5  ?PLT 287  ? ?Recent Labs  ?  01/04/22 ?0533  ?NA 138  ?K 3.7  ?CL 100  ?CO2 29  ?GLUCOSE 112*  ?BUN 16  ?CREATININE 0.59  ?CALCIUM 9.1  ? ? ?Intake/Output Summary (Last 24 hours) at 01/06/2022 1406 ?Last data filed at 01/06/2022 7510 ?Gross per 24 hour  ?Intake 120 ml  ?Output --  ?Net 120 ml  ?  ? ?  ? ?Physical Exam: ?Vital Signs ?Blood pressure (!) 107/55, pulse (!) 52, temperature (!) 97.5 ?F (36.4 ?C), resp. rate 18, height 5' 2.5" (1.588 m), weight 51.6 kg, SpO2 98 %. ? ? ? ?General: awake, alert, appropriate, sitting up on phone in bed; NAD ?HENT: conjugate gaze; oropharynx moist ?CV: regular rate; no JVD ?Pulmonary: CTA B/L; no W/R/R- good air movement ?GI: soft, NT, ND, (+)BS ?Psychiatric: appropriate- bright affect ?Neurological: slightly delayed processing but slightly faster than last 2 days.  ?Musculoskeletal:  ?   Cervical back: Neck supple. No tenderness.  ?   Comments: 5-/5 throughout strength B.L   ?Skin: ?   General: Skin is warm and dry.  ?   Comments: 2 IV's L AC fossa and R forearm- otherwise, no skin breakdown  ?Neurological:  ?   Mental Status: She is alert and oriented to person, place, and time.  ?   Comments: Speech clear. Alert and appropriate. She ws asble to follow basic one an two step commands without difficulty.  ?Ox3- except didn't know next holiday was memorial day  ? ? ?Assessment/Plan: ?1. Functional deficits which require 3+ hours per day of interdisciplinary therapy in a comprehensive inpatient rehab setting. ?Physiatrist is providing close team supervision  and 24 hour management of active medical problems listed below. ?Physiatrist and rehab team continue to assess barriers to discharge/monitor patient progress toward functional and medical goals ? ?Care Tool: ? ?Bathing ?   ?Body parts bathed by patient: Right arm, Right lower leg, Left arm, Left lower leg, Chest, Abdomen, Face, Front perineal area, Buttocks, Right upper leg, Left upper leg  ?   ?  ?  ?Bathing assist Assist Level: Contact Guard/Touching assist ?  ?  ?Upper Body Dressing/Undressing ?Upper body dressing   ?What is the patient wearing?: Pull over shirt ?   ?Upper body assist Assist Level: Supervision/Verbal cueing ?   ?Lower Body Dressing/Undressing ?Lower body dressing ? ? ?   ?What is the patient wearing?: Pants ? ?  ? ?Lower body assist Assist for lower body dressing: Supervision/Verbal cueing ?   ? ?Toileting ?Toileting    ?Toileting assist Assist for toileting: Contact Guard/Touching assist ?  ?  ?Transfers ?Chair/bed transfer ? ?Transfers assist ?   ? ?Chair/bed transfer assist level: Contact Guard/Touching assist ?  ?  ?Locomotion ?Ambulation ? ? ?  Ambulation assist ? ?   ? ?Assist level: Minimal Assistance - Patient > 75% ?Assistive device: No Device ?Max distance: 150'  ? ?Walk 10 feet activity ? ? ?Assist ?   ? ?Assist level: Contact Guard/Touching assist ?Assistive device: Walker-rolling  ? ?Walk 50 feet activity ? ? ?Assist   ? ?Assist level: Contact Guard/Touching assist ?Assistive device: Walker-rolling  ? ? ?Walk 150 feet activity ? ? ?Assist Walk 150 feet activity did not occur: Safety/medical concerns ? ?  ?  ?  ? ?Walk 10 feet on uneven surface  ?activity ? ? ?Assist   ? ? ?Assist level: Contact Guard/Touching assist ?Assistive device: Walker-rolling  ? ?Wheelchair ? ? ? ? ?Assist Is the patient using a wheelchair?: No ?  ?  ? ?  ?   ? ? ?Wheelchair 50 feet with 2 turns activity ? ? ? ?Assist ? ?  ?  ? ? ?   ? ?Wheelchair 150 feet activity  ? ? ? ?Assist ?   ? ? ?   ? ?Blood pressure  (!) 107/55, pulse (!) 52, temperature (!) 97.5 ?F (36.4 ?C), resp. rate 18, height 5' 2.5" (1.588 m), weight 51.6 kg, SpO2 98 %. ? ?Medical Problem List and Plan: ?1. 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            -patient may  shower ?            -ELOS/Goals: 7-10 days mod I to supervision ? 4/23- Continue CIR- PT, OT and SLP ?2.  Antithrombotics: ?-DVT/anticoagulation:  Mechanical: Sequential compression devices, below knee Bilateral lower extremities ?            -antiplatelet therapy: N/A ?3. Pain Management: Tylenol prn. Reports HA have resolved ?4. Mood: LCSW to follow for evaluation and support.  ?            -antipsychotic agents: N/a ?5. Neuropsych: This patient may be intermittently capable of making decisions on her own behalf. ?6. Skin/Wound Care: Routine pressure relief measuress.  ?7. Fluids/Electrolytes/Nutrition: Monitor I/O. Check CMET in am.  ?8. 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            --on decadron 4 mg every 6 hours for midline shift/cerebral edema.  ? 4/23- don't wean Decadron per rad onc.  ?9. Lung cancer/COPD: On maintenance immunotherapy every 3 weeks of Atezolizumab              --Breo/ellipta bid ?10. Constipation: Will start  miralax daily ? 4/21- didn't get miralax- hasn't had BM- will give Miralax QOD- per her home regimen ?11. Leucocytosis: Likely steroid effect.  ?--Will monitor for fevers and other signs of infection.  ?  4/21- WBC is likely due to steroids- afebrile- no illness Sx's.  ?  4/23- will recheck labs in AM ? ?I spent a total of  35  minutes on total care today- >50% coordination of care- due to ipoC and d/w nursing about pt.  ? ? ? ?LOS: ?3 days ?A FACE TO FACE EVALUATION WAS PERFORMED ? ?Glyn Gerads ?01/06/2022, 2:06 PM  ? ? ? ?

## 2022-01-06 NOTE — IPOC Note (Signed)
Overall Plan of Care (IPOC) ?Patient Details ?Name: [name] ?MRN: [id number] ?DOB: 30-Nov-1950 ? ?Admitting Diagnosis: NSCLC metastatic to brain [facility]) ? ?Hospital Problems: Principal Problem: ?  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 ? ? ? Functional Problem List: ?Nursing Bowel, Edema, Endurance, Medication Management, Motor, Safety  ?PT Balance, Perception, Safety, Motor  ?OT Balance, Endurance, Motor, Vision  ?SLP Cognition  ?TR    ?    ? Basic ADL?s: ?OT Bathing, Dressing, Toileting  ? ?  Advanced  ADL?s: ?OT    ?   ?Transfers: ?PT Bed Mobility, Bed to Chair, Car, Furniture  ?OT Toilet, Tub/Shower  ? ?  Locomotion: ?PT Ambulation, Stairs  ? ?  Additional Impairments: ?OT None  ?SLP Social Cognition ?  ?Memory, Problem Solving  ?TR    ? ? ?Anticipated Outcomes ?Item Anticipated Outcome  ?Self Feeding no goal set  ?Swallowing ?   ?  ?Basic self-care ? mod I  ?Toileting ? mod I ?  ?Bathroom Transfers mod I  ?Bowel/Bladder ? supervision  ?Transfers ? mod I  ?Locomotion ? Mod I  ?Communication ?    ?Cognition ?    ?Pain ? n/a  ?Safety/Judgment ? supervision  ? ?Therapy Plan: ?PT Intensity: Minimum of 1-2 x/day ,45 to 90 minutes ?PT Frequency: 5 out of 7 days ?PT Duration Estimated Length of Stay: 7 days ?OT Intensity: Minimum of 1-2 x/day, 45 to 90 minutes ?OT Frequency: 5 out of 7 days ?OT Duration/Estimated Length of Stay: 7 days ?SLP Intensity: Minumum of 1-2 x/day, 30 to 90 minutes ?SLP Frequency: 3 to 5 out of 7 days ?SLP Duration/Estimated Length of Stay: 1 week and then discharge to surgery  ? ?Due to the current state of emergency, patients may not be receiving their 3-hours of Medicare-mandated therapy. ? ? Team Interventions: ?Nursing Interventions Patient/Family Education, Bowel Management, Disease Management/Prevention, Medication Management, Discharge Planning, Psychosocial Support  ?PT interventions Ambulation/gait training, Cognitive remediation/compensation, Discharge planning, DME/adaptive equipment  instruction, Functional mobility training, Pain management, Psychosocial support, Splinting/orthotics, Therapeutic Activities, UE/LE Strength taining/ROM, Visual/perceptual remediation/compensation, Wheelchair propulsion/positioning, UE/LE Coordination activities, Therapeutic Exercise, Stair training, Skin care/wound management, Patient/family education, Neuromuscular re-education, Functional electrical stimulation, Disease management/prevention, Academic librarian, Training and development officer  ?OT Interventions Balance/vestibular training, Discharge planning, Self Care/advanced ADL retraining, Therapeutic Activities, UE/LE Coordination activities, Functional mobility training, Patient/family education, Therapeutic Exercise, UE/LE Strength taining/ROM, Community reintegration, DME/adaptive equipment instruction, Psychosocial support, Disease mangement/prevention  ?SLP Interventions Cognitive remediation/compensation, Internal/external aids, Therapeutic Activities, Environmental controls, Cueing hierarchy, Functional tasks, Patient/family education  ?TR Interventions    ?SW/CM Interventions Discharge Planning, Psychosocial Support, Patient/Family Education  ? ?Barriers to Discharge ?MD  Medical stability, Home enviroment access/loayout, Lack of/limited family support, and Weight  ?Nursing Decreased caregiver support, Incontinence, Lack of/limited family support, Insurance for SNF coverage, Medication compliance, Pending surgery, Pending chemo/radiation ?1 level, 1 step, no rail. Discharging home with spouse. Can't provide physical assist for mobility, can assist with ADL's. Sister able to provide up to mod assist mobility and ADL's. Pending surgery, currently radiation therapy. High Fall Risk, hx. falls.  ?PT Inaccessible home environment, Pending surgery ?   ?OT   ?   ?SLP   ?   ?SW   ?   ? ?Team Discharge Planning: ?Destination: PT-Home ,OT- Home , SLP- (back to actue for surgery) ?Projected Follow-up:  PT-Outpatient PT, OT-   (back to acute care), SLP- (TBD after surgery) ?Projected Equipment Needs: PT-Rolling walker with 5" wheels, OT- To be determined, SLP-None recommended by SLP ?  Equipment Details: PT- , OT-  ?Patient/family involved in discharge planning: PT- Patient,  OT-Patient, SLP-Patient ? ?MD ELOS: ~ 7 days- have to leave to get crani ?Medical Rehab Prognosis:  Good ?Assessment:  ?The patient has been admitted for CIR therapies with the diagnosis of brain mets. The team will be addressing functional mobility, strength, stamina, balance, safety, adaptive techniques and equipment, self-care, bowel and bladder mgt, patient and caregiver education, . Goals have been set at mod I to supervision. Anticipated discharge destination is home. ? ?Due to the current state of emergency, patients may not be receiving their 3 hours per day of Medicare-mandated therapy.  ? ? ? ? ? ?See Team Conference Notes for weekly updates to the plan of care ? ?

## 2022-01-06 NOTE — Progress Notes (Signed)
?  Radiation Oncology         ([phone]) [phone] ?________________________________ ? ?Stereotactic Treatment Procedure Note ? ?Name: KYMBERLEE VIGER MRN: [id number]  ?Date: 01/03/2022  DOB: 1951-07-19 ? ?SPECIAL TREATMENT PROCEDURE ? ?  ICD-10-CM   ?1. Malignant neoplasm of upper lobe of right lung (HCC)  C34.11   ?  ?2. Malignant neoplasm metastatic to brain [facility])  C79.31   ?  ? ? ?3D TREATMENT PLANNING AND DOSIMETRY:  The patient's radiation plan was reviewed and approved by neurosurgery and radiation oncology prior to treatment.  It showed 3-dimensional radiation distributions overlaid onto the planning CT/MRI image set.  The [facility] for the target structures as well as the organs at risk were reviewed. The documentation of the 3D plan and dosimetry are filed in the radiation oncology EMR. ? ?NARRATIVE:  SHALAUNDA WEATHERHOLTZ was brought to the TrueBeam stereotactic radiation treatment machine and placed supine on the CT couch. The head frame was applied, and the patient was set up for stereotactic radiosurgery.  Neurosurgery was present for the set-up and delivery ? ?SIMULATION VERIFICATION:  In the couch zero-angle position, the patient underwent Exactrac imaging using the Brainlab system with orthogonal KV images.  These were carefully aligned and repeated to confirm treatment position for each of the isocenters.  The Exactrac snap film verification was repeated at each couch angle. ? ?PROCEDURE: Beckey Rutter Glogowski received stereotactic radiosurgery to the following targets: ?Right Occipital 35 mm target was treated using 4 Rapid Arc VMAT Beams to a fractional prescription dose of 8 Gy to be repeated for a total of 3 fractions to a total prescription dose of 24 Gy.  ExacTrac registration was performed for each couch angle.  The 100% isodose line was prescribed.  6 MV X-rays were delivered in the flattening filter free beam mode. ? ? ?STEREOTACTIC TREATMENT MANAGEMENT:  Following delivery, the patient was transported to nursing in  stable condition and monitored for possible acute effects.  Vital signs were recorded There were no vitals taken for this visit.. The patient tolerated treatment without significant acute effects, and was discharged to home in stable condition.   ? ?PLAN: Continue pre-OP SRT to be followed by resection and then follow-up in one month. ? ?________________________________ ? ?Sheral Apley Tammi Klippel, M.D. ? ? ?

## 2022-01-07 ENCOUNTER — Ambulatory Visit
Admit: 2022-01-07 | Discharge: 2022-01-07 | Disposition: A | Discharge status: Home / Self Care | Payer: Medicare Other | Attending: Radiation Oncology | Admitting: Radiation Oncology

## 2022-01-07 ENCOUNTER — Other Ambulatory Visit: Payer: Self-pay

## 2022-01-07 DIAGNOSIS — K5901 Slow transit constipation: Secondary | ICD-10-CM

## 2022-01-07 DIAGNOSIS — C3411 Malignant neoplasm of upper lobe, right bronchus or lung: Secondary | ICD-10-CM | POA: Insufficient documentation

## 2022-01-07 DIAGNOSIS — C7931 Secondary malignant neoplasm of brain: Secondary | ICD-10-CM

## 2022-01-07 DIAGNOSIS — Z510 Encounter for antineoplastic radiation therapy: Secondary | ICD-10-CM | POA: Diagnosis not present

## 2022-01-07 LAB — RAD ONC ARIA SESSION SUMMARY
Course Elapsed Days: 4
Plan Fractions Treated to Date: 2
Plan Prescribed Dose Per Fraction: 8 Gy
Plan Total Fractions Prescribed: 3
Plan Total Prescribed Dose: 24 Gy
Reference Point Dosage Given to Date: 16 Gy
Reference Point Session Dosage Given: 8 Gy
Session Number: 2

## 2022-01-07 LAB — BASIC METABOLIC PANEL
Anion gap: 8 (ref 5–15)
BUN: 14 mg/dL (ref 8–23)
CO2: 28 mmol/L (ref 22–32)
Calcium: 8.8 mg/dL — ABNORMAL LOW (ref 8.9–10.3)
Chloride: 101 mmol/L (ref 98–111)
Creatinine, Ser: 0.55 mg/dL (ref 0.44–1.00)
GFR, Estimated: >60 mL/min (ref >60)
Glucose, Bld: 119 mg/dL — ABNORMAL HIGH (ref 70–99)
Potassium: 4.6 mmol/L (ref 3.5–5.1)
Sodium: 137 mmol/L (ref 135–145)

## 2022-01-07 LAB — CBC
HCT: 38.7 % (ref 36.0–46.0)
Hemoglobin: 13 g/dL (ref 12.0–15.0)
MCH: 31.6 pg (ref 26.0–34.0)
MCHC: 33.6 g/dL (ref 30.0–36.0)
MCV: 93.9 fL (ref 80.0–100.0)
Platelets: 263 10*3/uL (ref 150–400)
RBC: 4.12 MIL/uL (ref 3.87–5.11)
RDW: 11.9 % (ref 11.5–15.5)
WBC: 14.3 10*3/uL — ABNORMAL HIGH (ref 4.0–10.5)
nRBC: 0 % (ref 0.0–0.2)

## 2022-01-07 NOTE — Progress Notes (Signed)
Inpatient Rehabilitation Care Coordinator ?Assessment and Plan ?Patient Details  ?Name: [name] ?MRN: [id number] ?Date of Birth: 02/06/1951 ? ?Today's Date: 01/07/2022 ? ?Hospital Problems: Principal Problem: ?  NSCLC metastatic to brain [facility]) ? ?Past Medical History:  ?Past Medical History:  ?Diagnosis Date  ? Cataract   ? COPD (chronic obstructive pulmonary disease) ([place])   ? NSCLC of upper lobe ([place]) 04/2020  ? ?Past Surgical History:  ?Past Surgical History:  ?Procedure Laterality Date  ? APPENDECTOMY  Nov 04, 2006  ? COLON SURGERY  Nov 04, 2006  ? colostomy-infection post op appendectomy  ? COLOSTOMY REVERSAL  2007-11-05  ? INTERCOSTAL NERVE BLOCK Right 10/19/2020  ? Procedure: INTERCOSTAL NERVE BLOCK;  Surgeon: [name], MD;  Location: [place];  Service: Thoracic;  Laterality: Right;  ? NODE DISSECTION Right 10/19/2020  ? Procedure: NODE DISSECTION;  Surgeon: [name], MD;  Location: [place];  Service: Thoracic;  Laterality: Right;  ? VIDEO BRONCHOSCOPY WITH ENDOBRONCHIAL NAVIGATION N/A 05/08/2020  ? Procedure: VIDEO BRONCHOSCOPY WITH ENDOBRONCHIAL NAVIGATION;  Surgeon: [name], MD;  Location: [place];  Service: Thoracic;  Laterality: N/A;  ? VIDEO BRONCHOSCOPY WITH ENDOBRONCHIAL ULTRASOUND N/A 05/08/2020  ? Procedure: VIDEO BRONCHOSCOPY WITH ENDOBRONCHIAL ULTRASOUND;  Surgeon: [name], MD;  Location: [place];  Service: Thoracic;  Laterality: N/A;  ? ?Social History:  reports that she quit smoking about 2 years ago. Her smoking use included cigarettes. She has a 2.50 pack-year smoking history. She has never used smokeless tobacco. She reports that she does not currently use alcohol after a past usage of about 10.0 standard drinks per week. She reports that she does not use drugs. ? ?Family / Support Systems ?Marital Status: Married ?How Long?: 43 yrs ?Patient Roles: Spouse ?Spouse/Significant Other: [name] (husband) ?Children: Only dtr deceased. Passed in November 04, 2008 (unexpected) ?Other  Supports: None reported ?Anticipated Caregiver: Husband and sister ?Ability/Limitations of Caregiver: Reports her husband is primary caregiver but limited in his ability to help physically. ?Caregiver Availability: 24/7 ?Family Dynamics: Pt lives with husband ? 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 ?Education: business school ?Health Literacy - How often do you need to have someone help you when you read instructions, pamphlets, or other written material from your doctor or pharmacy?: Never ?Writes: Yes ?Employment Status: Retired ?Legal History/Current Legal Issues: Denies ?Guardian/Conservator: N/A  ? ?Abuse/Neglect ?Abuse/Neglect Assessment Can Be Completed: Yes ?Physical Abuse: Denies ?Verbal Abuse: Denies ?Sexual Abuse: Denies ?Exploitation of patient/patient's resources: Denies ?Self-Neglect: Denies ? ?Patient response to: ?Social Isolation - How often do you feel lonely or isolated from those around you?: Never ? ?Emotional Status ?Pt's affect, behavior and adjustment status: Pt in good spirits at time of visit. Pt is doing her best to remain positive during this time. Pt became tearful as she discussed being home, seeing her husband and her dog that was her daughter's. ?Recent Psychosocial Issues: Pt admits to anxiety/depression at times however it has not required any medication ?Psychiatric History: Denies ?Substance Abuse History: Pt admits that she quit smoking cigarettes 2-3 yrs ago due to health reason. Pt denies etoh and rec drug use. ? ?Patient / Family Perceptions, Expectations & Goals ?Pt/Family understanding of illness & functional limitations: Pt and family have a general understanding of pt care needs. ?Premorbid pt/family roles/activities: Independent ?Anticipated changes in roles/activities/participation: Assistance with  ADLs/IADls ?Pt/family expectations/goals: Pt goal  is to be able to just go home. ? ?[employer] ?Community Agencies: None ?Premorbid Home Care/DME Agencies: None ?Transportation available at discharge: TBD ?Is the patient able to respond to transportation needs?: Yes ?In the past 12 months, has lack of transportation kept you from medical appointments or from getting medications?: No ?In the past 12 months, has lack of transportation kept you from meetings, work, or from getting things needed for daily living?: No ?Resource referrals recommended: Neuropsychology ? ?Discharge Planning ?Living Arrangements: Spouse/significant other ?Support Systems: Spouse/significant other, Other relatives ?Type of Residence: Private residence ?Insurance Resources: Multimedia programmer (specify) (BCBS Medicare) ?Financial Resources: Social Security ?Financial Screen Referred: No ?Living Expenses: Own ?Money Management: Patient, Spouse ?Does the patient have any problems obtaining your medications?: No ?Home Management: Pt reports she and her husband share household responsbilities. ?Patient/Family Preliminary Plans: TBD ?Care Coordinator Barriers to Discharge: Decreased caregiver support, Lack of/limited family support ?Care Coordinator Anticipated Follow Up Needs: HH/OP ? ?Clinical Impression ?SW met with pt in room to introduce self, explain role, and discuss discharge process. Pt is not a English as a second language teacher. Husband is a English as a second language teacher. HCPOA- husband [name]. No DME. Pt aware SW will f.u with her husband.  ? ?1618-SW left message for pt husband [name] introducing self, explaining role, and discussed discharge process. SW informed will continue to follow-up.  ? ?Rana Snare ?01/07/2022, 1:34 PM ? ?  ?

## 2022-01-07 NOTE — Progress Notes (Addendum)
?                                                       PROGRESS NOTE ? ? ?Subjective/Complaints: ? ?Pt in good spirits. Feels that she's getting stronger but still fatigues. Learning how to work with dizziness/balance issues ? ?ROS: Patient denies fever, rash, sore throat, blurred vision, dizziness, nausea, vomiting, diarrhea, cough, shortness of breath or chest pain, joint or back/neck pain, headache, or mood change.  ? ?Objective: ?  ?No results found. ?Recent Labs  ?  01/07/22 ?5102  ?WBC 14.3*  ?HGB 13.0  ?HCT 38.7  ?PLT 263  ? ?Recent Labs  ?  01/07/22 ?5852  ?NA 137  ?K 4.6  ?CL 101  ?CO2 28  ?GLUCOSE 119*  ?BUN 14  ?CREATININE 0.55  ?CALCIUM 8.8*  ? ? ?Intake/Output Summary (Last 24 hours) at 01/07/2022 1038 ?Last data filed at 01/07/2022 0754 ?Gross per 24 hour  ?Intake 480 ml  ?Output --  ?Net 480 ml  ?  ? ?  ? ?Physical Exam: ?Vital Signs ?Blood pressure (!) 135/59, pulse (!) 47, temperature 97.9 ?F (36.6 ?C), resp. rate 16, height 5' 2.5" (1.588 m), weight 51.6 kg, SpO2 99 %. ? ? ? ?Constitutional: No distress . Vital signs reviewed. ?HEENT: NCAT, EOMI, oral membranes moist ?Neck: supple ?Cardiovascular: RRR without murmur. No JVD    ?Respiratory/Chest: CTA Bilaterally without wheezes or rales. Normal effort    ?GI/Abdomen: BS +, non-tender, non-distended ?Ext: no clubbing, cyanosis, or edema ?Psych: pleasant and cooperative  ?Musculoskeletal:  ?   Cervical back: Neck supple. No tenderness.  ?   ?Skin: ?   General: Skin is warm and dry.  ?   Comments: 2 IV's L AC fossa and R forearm- otherwise, no skin breakdown  ?Neurological:  ?   Mental Status: She is alert and oriented to person, place, and time.  ?   Comments: Speech clear. Alert and appropriate. She ws asble to follow basic one an two step commands without difficulty.  ?Ox3-. Moves all 4's 4+/5. No focal sensory findings. No ataxia  ? ? ?Assessment/Plan: ?1. Functional deficits which require 3+ hours per day of interdisciplinary therapy in a  comprehensive inpatient rehab setting. ?Physiatrist is providing close team supervision and 24 hour management of active medical problems listed below. ?Physiatrist and rehab team continue to assess barriers to discharge/monitor patient progress toward functional and medical goals ? ?Care Tool: ? ?Bathing ?   ?Body parts bathed by patient: Right arm, Right lower leg, Left arm, Left lower leg, Chest, Abdomen, Face, Front perineal area, Buttocks, Right upper leg, Left upper leg  ?   ?  ?  ?Bathing assist Assist Level: Set up assist ?  ?  ?Upper Body Dressing/Undressing ?Upper body dressing   ?What is the patient wearing?: Pull over shirt ?   ?Upper body assist Assist Level: Supervision/Verbal cueing ?   ?Lower Body Dressing/Undressing ?Lower body dressing ? ? ? Lower body dressing activity did not occur:  (Pt did her oown lower body dressings) ?What is the patient wearing?: Underwear/pull up, Pants ? ?  ? ?Lower body assist Assist for lower body dressing: Supervision/Verbal cueing ?   ? ?Toileting ?Toileting    ?Toileting assist Assist for toileting: Supervision/Verbal cueing ?  ?  ?Transfers ?Chair/bed transfer ? ?Transfers assist ?   ? ?  Chair/bed transfer assist level: Supervision/Verbal cueing ?  ?  ?Locomotion ?Ambulation ? ? ?Ambulation assist ? ?   ? ?Assist level: Minimal Assistance - Patient > 75% ?Assistive device: No Device ?Max distance: 150'  ? ?Walk 10 feet activity ? ? ?Assist ?   ? ?Assist level: Contact Guard/Touching assist ?Assistive device: Walker-rolling  ? ?Walk 50 feet activity ? ? ?Assist   ? ?Assist level: Contact Guard/Touching assist ?Assistive device: Walker-rolling  ? ? ?Walk 150 feet activity ? ? ?Assist Walk 150 feet activity did not occur: Safety/medical concerns ? ?  ?  ?  ? ?Walk 10 feet on uneven surface  ?activity ? ? ?Assist   ? ? ?Assist level: Contact Guard/Touching assist ?Assistive device: Walker-rolling  ? ?Wheelchair ? ? ? ? ?Assist Is the patient using a wheelchair?: No ?  ?   ? ?  ?   ? ? ?Wheelchair 50 feet with 2 turns activity ? ? ? ?Assist ? ?  ?  ? ? ?   ? ?Wheelchair 150 feet activity  ? ? ? ?Assist ?   ? ? ?   ? ?Blood pressure (!) 135/59, pulse (!) 47, temperature 97.9 ?F (36.6 ?C), resp. rate 16, height 5' 2.5" (1.588 m), weight 51.6 kg, SpO2 99 %. ? ?Medical Problem List and Plan: ?1. 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            -patient may  shower ?            -ELOS/Goals: 7-10 days mod I to supervision ? -Continue CIR therapies including PT, OT, and SLP  ?2.  Antithrombotics: ?-DVT/anticoagulation:  Mechanical: Sequential compression devices, below knee Bilateral lower extremities ?            -antiplatelet therapy: N/A ?3. Pain Management: Tylenol prn. Reports HA have resolved ?4. Mood: LCSW to follow for evaluation and support.  ?            -antipsychotic agents: N/a ?5. Neuropsych: This patient may be intermittently capable of making decisions on her own behalf. ?6. Skin/Wound Care: Routine pressure relief measuress.  ?7. Fluids/Electrolytes/Nutrition: Monitor I/O. Encourage PO  ?8. 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            --on decadron 4 mg every 6 hours for midline shift/cerebral edema.  ? 4/24 continue Decadron per rad onc.  ?9. 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 Constipation: Will start  miralax daily ? 4/24 moving bowels now, had loose bm this morning ?11. Leucocytosis: No signs of fevers or infection.  ?  4/24--14k, likely steroid effect ? ?  ? ? ? ?LOS: ?4 days ?A FACE TO FACE EVALUATION WAS PERFORMED ? ?Meredith Staggers ?01/07/2022, 10:38 AM  ? ? ? ?

## 2022-01-07 NOTE — Progress Notes (Signed)
Patient ID: [name], female   DOB: [birth year], 71 y.o.   MRN: [id number] ? ?SW returned phone call to pt husband Juanda Crumble to inform on ELOS and will f/u after team conference. He reports he is not physically able to assist due to a bad back and hip. States pt sister is coming in on Thursday to be here for pt surgery. Likely to stay here up to one week.  ? ?Loralee Pacas, MSW, LCSWA ?Office: [phone] ?Cell: [phone] ?Fax: [phone]  ?

## 2022-01-07 NOTE — Care Management (Signed)
Inpatient Rehabilitation Center ?Individual Statement of Services ? ?Patient Name:  [name]  ?Date:  01/07/2022 ? ?Welcome to the [facility].  Our goal is to provide you with an individualized program based on your diagnosis and situation, designed to meet your specific needs.  With this comprehensive rehabilitation program, you will be expected to participate in at least 3 hours of rehabilitation therapies Monday-Friday, with modified therapy programming on the weekends. ? ?Your rehabilitation program will include the following services:  Physical Therapy (PT), Occupational Therapy (OT), 24 hour per day rehabilitation nursing, Therapeutic Recreaction (TR), Psychology, Neuropsychology, Care Coordinator, Rehabilitation Medicine, Nutrition Services, Pharmacy Services, and Other ? ?Weekly team conferences will be held on Tuesdays to discuss your progress.  Your Inpatient Rehabilitation Care Coordinator will talk with you frequently to get your input and to update you on team discussions.  Team conferences with you and your family in attendance may also be held. ? ?Expected length of stay: 7 days ? ?Overall anticipated outcome: Independent with an Assistive Device ? ?Depending on your progress and recovery, your program may change. Your Inpatient Rehabilitation Care Coordinator will coordinate services and will keep you informed of any changes. Your Inpatient Rehabilitation Care Coordinator's name and contact numbers are listed  below. ? ?The following services may also be recommended but are not provided by the [facility]:  ?Driving Evaluations ?Home Health Rehabiltiation Services ?Outpatient Rehabilitation Services ?Vocational Rehabilitation ?  ?Arrangements will be made to provide these services after discharge if needed.  Arrangements include referral to agencies that provide these services. ? ?Your insurance has been verified to be:   [employer] ? ?Your primary  doctor is:  [name] ? ?Pertinent information will be shared with your doctor and your insurance company. ? ?Inpatient Rehabilitation Care Coordinator:  Cathleen Corti [phone] or (C) [phone] ? ?Information discussed with and copy given to patient by: Rana Snare, 01/07/2022, 11:14 AM    ?

## 2022-01-07 NOTE — Progress Notes (Signed)
Speech Language Pathology Daily Session Note ? ?Patient Details  ?Name: [name] ?MRN: [id number] ?Date of Birth: 07-20-1951 ? ?Today's Date: 01/07/2022 ?SLP Individual Time: 1030-1130 ?SLP Individual Time Calculation (min): 60 min ? ?Short Term Goals: ?Week 1: SLP Short Term Goal 1 (Week 1): STGs=LTGs due to ELOS ? ?Skilled Therapeutic Interventions:  Skilled treatment session focused on cognitive goals. Upon arrival, patient was awake in her recliner and consuming her early lunch tray. SLP facilitated session by dividing her attention between self-feeding and a functional conversation that focused on recall. Throughout conversation, patient recalled functional information with overall supervision level verbal cues. SLP also facilitated session by providing Min A verbal cues for patient to utilize memory compensatory strategies while attempting to recall and navigate in order to locate 5 different places/items. Patient independently recalled 75% of the items with Min verbal cues needed for problem solving while navigating around the unit. Patient ambulated throughout task without an assistive device and did not require rest breaks. Patient requested to use the bathroom and was continent of bladder. Patient handed off to [facility] for transport. Continue with current plan of care.  ?   ? ?Pain ?Pain Assessment ?Pain Scale: 0-10 ?Pain Score: 0-No pain ? ?Therapy/Group: Individual Therapy ? ?Naw Lasala ?01/07/2022, 12:23 PM ?

## 2022-01-07 NOTE — Progress Notes (Signed)
Occupational Therapy Session Note ? ?Patient Details  ?Name: [name] ?MRN: [id number] ?Date of Birth: 05-11-1951 ? ?Today's Date: 01/07/2022 ?OT Individual Time: 5449-2010 ?OT Individual Time Calculation (min): 55 min  ? ? ?Short Term Goals: ?Week 1:  OT Short Term Goal 1 (Week 1): STG=LTG d/t ELOS ? ?Skilled Therapeutic Interventions/Progress Updates:  ?  Pt received supine with no c/o pain, agreeable to OT session. Pt donned shirt and pants with set up assist only. She ambulated into the bathroom with close (S) and completed 3/3 toileting tasks with (S), (+) urine void. Standing level oral hygiene with (S). Discussed energy conservation strategies at home and OT POC. She completed 120 ft of functional mobility to the therapy gym with close (S). She completed blocked practice sit <> stands with an overhead reach holding a 4lb dumbbell to add a strength and resistance training component, 1x10 repetitions. Pt completed several dynamic balance exercises to decrease fall risk and promote increased education on body biomechanics. She required min guard for transitioning in/out of a narrow BOS with a step forward. She completed several floor transfers with use of the mat with CGA and then floor to standing with min A. Cueing/education on increasing BOS to increase ease. She returned to her room with close (S). Pt was left sitting up in the recliner with all needs met, chair alarm set, and call bell within reach.  ? ? ? ?Therapy Documentation ?Precautions:  ?Precautions ?Precautions: Fall ?Restrictions ?Weight Bearing Restrictions: Yes ? ?Therapy/Group: Individual Therapy ? ?Curtis Sites ?01/07/2022, 6:32 AM ?

## 2022-01-07 NOTE — Progress Notes (Signed)
Physical Therapy Session Note ? ?Patient Details  ?Name: [name] ?MRN: [id number] ?Date of Birth: [birth year] ? ?Today's Date: 01/07/2022 ?PT Individual Time: 8115-7262 ?PT Individual Time Calculation (min): 57 min  ? ?Short Term Goals: ?Week 1:  PT Short Term Goal 1 (Week 1): =LTGs d/t ELOS ? ?Skilled Therapeutic Interventions/Progress Updates:  ?   ?Pt presenting sitting in recliner, agreeable to PT tx. Denies pain. Sit<>stand with no AD with supervision. Ambulated to main rehab gym with supervision and no AD - slight drifting L<>R but no overt LOB or knee buckling, pleasantly conversant throughout. In rehab gym, completed balance testing with BERG, FGA, and 5xSTS. See below for details. We continued to work on continued dynamic gait training using agility ladder while tasked to challenge single leg balance, stepping strategies, and balance - required CGA with occasional LOB that needed minA for correction. She ambulated back to room with supervision and no AD - remained sitting in recliner with safety belt alarm on, all personal items in reach, call bell in lap.  ? ?Patient demonstrates increased fall risk as noted by score of  54/56 on Berg Balance Scale.  (<36= high risk for falls, close to 100%; 37-45 significant >80%; 46-51 moderate >50%; 52-55 lower >25%) ? ?Five times Sit to Stand Test (FTSS) ?Method: ?Use a straight back chair with a solid seat that is 16-18? high. Ask participant to sit on the chair with arms folded across their chest.   ?Instructions: ??Stand up and sit down as quickly as possible 5 times, keeping your arms folded across your chest.?   ?Measurement: ?Stop timing when the participant stands the 5th time. ? ?TIME: __12.5_ (in seconds) ? ?Times > 13.6 seconds is associated with increased disability and morbidity (Guralnik, 2000) ?Times > 15 seconds is predictive of recurrent falls in healthy individuals aged 55 and older (Buatois, et al., 2008) ?Normal performance values in community  dwelling individuals aged 69 and older (Bohannon, 2006): ?60-69 years: 11.4 seconds ?70-79 years: 12.6 seconds ?80-89 years: 14.8 seconds ? ?MCID: ? 2.3 seconds for Vestibular Disorders (Meretta, 2006) ? ? ?Therapy Documentation ?Precautions:  ?Precautions ?Precautions: Fall ?Restrictions ?Weight Bearing Restrictions: Yes ?General: ?  ?Balance: ?Balance ?Balance Assessed: Yes ?Standardized Balance Assessment ?Standardized Balance Assessment: Merrilee Jansky Balance Test;Functional Gait Assessment ?Berg Balance Test ?Sit to Stand: Able to stand without using hands and stabilize independently ?Standing Unsupported: Able to stand safely 2 minutes ?Sitting with Back Unsupported but Feet Supported on Floor or Stool: Able to sit safely and securely 2 minutes ?Stand to Sit: Sits safely with minimal use of hands ?Transfers: Able to transfer safely, minor use of hands ?Standing Unsupported with Eyes Closed: Able to stand 10 seconds safely ?Standing Ubsupported with Feet Together: Able to place feet together independently and stand 1 minute safely ?From Standing, Reach Forward with Outstretched Arm: Can reach forward >12 cm safely (5") ?From Standing Position, Pick up Object from Floor: Able to pick up shoe safely and easily ?From Standing Position, Turn to Look Behind Over each Shoulder: Looks behind from both sides and weight shifts well ?Turn 360 Degrees: Able to turn 360 degrees safely in 4 seconds or less ?Standing Unsupported, Alternately Place Feet on Step/Stool: Able to stand independently and safely and complete 8 steps in 20 seconds ?Standing Unsupported, One Foot in Front: Able to plae foot ahead of the other independently and hold 30 seconds ?Standing on One Leg: Able to lift leg independently and hold > 10 seconds ?Total Score: 54/56 ? ?Functional  Gait  Assessment ?Gait assessed : Yes ?Gait Level Surface: Walks 20 ft in less than 5.5 sec, no assistive devices, good speed, no evidence for imbalance, normal gait pattern,  deviates no more than 6 in outside of the 12 in walkway width. ?Change in Gait Speed: Able to smoothly change walking speed without loss of balance or gait deviation. Deviate no more than 6 in outside of the 12 in walkway width. ?Gait with Horizontal Head Turns: Performs head turns smoothly with no change in gait. Deviates no more than 6 in outside 12 in walkway width ?Gait with Vertical Head Turns: Performs head turns with no change in gait. Deviates no more than 6 in outside 12 in walkway width. ?Gait and Pivot Turn: Pivot turns safely within 3 sec and stops quickly with no loss of balance. ?Step Over Obstacle: Is able to step over 2 stacked shoe boxes taped together (9 in total height) without changing gait speed. No evidence of imbalance. ?Gait with Narrow Base of Support: Ambulates 4-7 steps. ?Gait with Eyes Closed: Cannot walk 20 ft without assistance, severe gait deviations or imbalance, deviates greater than 15 in outside 12 in walkway width or will not attempt task. ?Ambulating Backwards: Walks 20 ft, uses assistive device, slower speed, mild gait deviations, deviates 6-10 in outside 12 in walkway width. ?Steps: Alternating feet, must use rail. ?Total Score: 23/30 ?FGA comment:: no AD ? ?Therapy/Group: Individual Therapy ? ?Alger Simons PT ?01/07/2022, 9:21 AM  ?

## 2022-01-08 MED ORDER — ACETAMINOPHEN 325 MG PO TABS
325.0000 mg | ORAL_TABLET | ORAL | Status: DC | PRN
Start: 2022-01-08 — End: 2024-07-13

## 2022-01-08 NOTE — Progress Notes (Signed)
Patient ID: [name], female   DOB: July 28, 1951, 71 y.o.   MRN: [id number] ? ?SW met with pt in room to provide updates from team conference, and confirmed d/ for surgery on Friday. Pt confirms that her sister will be flying in from [facility] late Thursday. Pt is aware SW will follow-up with her husband.  ? ?[id number]-SW left message for pt husband Juanda Crumble [phone]) to inform on above, and reiterated with pt changing to a new service, any future recommendations will come from provider.  ? ?Loralee Pacas, MSW, LCSWA ?Office: [phone] ?Cell: [phone] ?Fax: [phone]  ?

## 2022-01-08 NOTE — Plan of Care (Signed)
?  Problem: Consults ?Goal: RH BRAIN INJURY PATIENT EDUCATION ?Description: Description: See Patient Education module for eduction specifics ?Outcome: Progressing ?  ?Problem: RH BOWEL ELIMINATION ?Goal: RH STG MANAGE BOWEL WITH ASSISTANCE ?Description: STG Manage Bowel with Supervision Assistance. ?Outcome: Progressing ?Goal: RH STG MANAGE BOWEL W/MEDICATION W/ASSISTANCE ?Description: STG Manage Bowel with Medication with Supervision Assistance. ?Outcome: Progressing ?  ?Problem: RH SKIN INTEGRITY ?Goal: RH STG MAINTAIN SKIN INTEGRITY WITH ASSISTANCE ?Description: STG Maintain Skin Integrity With Supervision Assistance. ?Outcome: Progressing ?Goal: RH STG ABLE TO PERFORM INCISION/WOUND CARE W/ASSISTANCE ?Description: STG Able To Perform Incision/Wound Care With Supervision Assistance. ?Outcome: Progressing ?  ?Problem: RH SAFETY ?Goal: RH STG ADHERE TO SAFETY PRECAUTIONS W/ASSISTANCE/DEVICE ?Description: STG Adhere to Safety Precautions With Cues and Reminders. ?Outcome: Progressing ?Goal: RH STG DECREASED RISK OF FALL WITH ASSISTANCE ?Description: STG Decreased Risk of Fall With Supervision Assistance. ?Outcome: Progressing ?  ?Problem: RH COGNITION-NURSING ?Goal: RH STG USES MEMORY AIDS/STRATEGIES W/ASSIST TO PROBLEM SOLVE ?Description: STG Uses Memory Aids/Strategies With Supervision Assistance to Problem Solve. ?Outcome: Progressing ?Goal: RH STG ANTICIPATES NEEDS/CALLS FOR ASSIST W/ASSIST/CUES ?Description: STG Anticipates Needs/Calls for Assist With Cues and Reminders. ?Outcome: Progressing ?  ?Problem: RH KNOWLEDGE DEFICIT BRAIN INJURY ?Goal: RH STG INCREASE KNOWLEDGE OF SELF CARE AFTER BRAIN INJURY ?Description: Patient will demonstrate knowledge of self-care management, bowel management, and safety precautions with educational materials and handouts provided by staff independently at discharge. ?Outcome: Progressing ?  ?

## 2022-01-08 NOTE — Progress Notes (Signed)
?                                                       PROGRESS NOTE ? ? ?Subjective/Complaints: ? ?Pt tolerated XRT yesterday without issues. Continues to progress with therapy. Denies pain ? ?ROS: Patient denies fever, rash, sore throat, blurred vision, dizziness, nausea, vomiting, diarrhea, cough, shortness of breath or chest pain, joint or back/neck pain, headache, or mood change.  ? ?Objective: ?  ?No results found. ?Recent Labs  ?  01/07/22 ?6599  ?WBC 14.3*  ?HGB 13.0  ?HCT 38.7  ?PLT 263  ? ?Recent Labs  ?  01/07/22 ?3570  ?NA 137  ?K 4.6  ?CL 101  ?CO2 28  ?GLUCOSE 119*  ?BUN 14  ?CREATININE 0.55  ?CALCIUM 8.8*  ? ? ?Intake/Output Summary (Last 24 hours) at 01/08/2022 1052 ?Last data filed at 01/07/2022 1779 ?Gross per 24 hour  ?Intake 480 ml  ?Output --  ?Net 480 ml  ?  ? ?  ? ?Physical Exam: ?Vital Signs ?Blood pressure (!) 113/55, pulse (!) 56, temperature 98.6 ?F (37 ?C), temperature source Oral, resp. rate 16, height 5' 2.5" (1.588 m), weight 51.6 kg, SpO2 98 %. ? ? ? ?Constitutional: No distress . Vital signs reviewed. ?HEENT: NCAT, EOMI, oral membranes moist ?Neck: supple ?Cardiovascular: RRR without murmur. No JVD    ?Respiratory/Chest: CTA Bilaterally without wheezes or rales. Normal effort    ?GI/Abdomen: BS +, non-tender, non-distended ?Ext: no clubbing, cyanosis, or edema ?Psych: pleasant and cooperative  ?Musculoskeletal:  ?   Cervical back: Neck supple. No tenderness.  ?   ?Skin: ?   General: Skin is warm and dry.  ?   Comments:IV still in place. No signs of breakdown  ?Neurological:  ?   Mental Status: She is alert and oriented to person, place, and time.  ?   Comments: Speech clear. Alert and appropriate. She ws asble to follow basic one an two step commands without difficulty.  ?Ox3-. Moves all 4's 4+/5. No focal sensory findings. No limb ataxia  ? ? ?Assessment/Plan: ?1. Functional deficits which require 3+ hours per day of interdisciplinary therapy in a comprehensive inpatient rehab  setting. ?Physiatrist is providing close team supervision and 24 hour management of active medical problems listed below. ?Physiatrist and rehab team continue to assess barriers to discharge/monitor patient progress toward functional and medical goals ? ?Care Tool: ? ?Bathing ?   ?Body parts bathed by patient: Right arm, Right lower leg, Left arm, Left lower leg, Chest, Abdomen, Face, Front perineal area, Buttocks, Right upper leg, Left upper leg  ?   ?  ?  ?Bathing assist Assist Level: Set up assist ?  ?  ?Upper Body Dressing/Undressing ?Upper body dressing   ?What is the patient wearing?: Pull over shirt ?   ?Upper body assist Assist Level: Supervision/Verbal cueing ?   ?Lower Body Dressing/Undressing ?Lower body dressing ? ? ? Lower body dressing activity did not occur:  (Pt did her oown lower body dressings) ?What is the patient wearing?: Underwear/pull up, Pants ? ?  ? ?Lower body assist Assist for lower body dressing: Supervision/Verbal cueing ?   ? ?Toileting ?Toileting    ?Toileting assist Assist for toileting: Supervision/Verbal cueing ?  ?  ?Transfers ?Chair/bed transfer ? ?Transfers assist ?   ? ?Chair/bed transfer assist  level: Supervision/Verbal cueing ?  ?  ?Locomotion ?Ambulation ? ? ?Ambulation assist ? ?   ? ?Assist level: Minimal Assistance - Patient > 75% ?Assistive device: No Device ?Max distance: 150'  ? ?Walk 10 feet activity ? ? ?Assist ?   ? ?Assist level: Contact Guard/Touching assist ?Assistive device: Walker-rolling  ? ?Walk 50 feet activity ? ? ?Assist   ? ?Assist level: Contact Guard/Touching assist ?Assistive device: Walker-rolling  ? ? ?Walk 150 feet activity ? ? ?Assist Walk 150 feet activity did not occur: Safety/medical concerns ? ?  ?  ?  ? ?Walk 10 feet on uneven surface  ?activity ? ? ?Assist   ? ? ?Assist level: Contact Guard/Touching assist ?Assistive device: Walker-rolling  ? ?Wheelchair ? ? ? ? ?Assist Is the patient using a wheelchair?: No ?  ?  ? ?  ?   ? ? ?Wheelchair 50  feet with 2 turns activity ? ? ? ?Assist ? ?  ?  ? ? ?   ? ?Wheelchair 150 feet activity  ? ? ? ?Assist ?   ? ? ?   ? ?Blood pressure (!) 113/55, pulse (!) 56, temperature 98.6 ?F (37 ?C), temperature source Oral, resp. rate 16, height 5' 2.5" (1.588 m), weight 51.6 kg, SpO2 98 %. ? ?Medical Problem List and Plan: ?1. 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            -patient may  shower ?            -ELOS/Goals: 4/28---for craniotomy/resection, mod I to supervision ? -Continue CIR therapies including PT, OT, and SLP. Interdisciplinary team conference today to discuss goals, barriers to discharge, and dc planning.    ?2.  Antithrombotics: ?-DVT/anticoagulation:  Mechanical: Sequential compression devices, below knee Bilateral lower extremities ?            -antiplatelet therapy: N/A ?3. Pain Management: Tylenol prn. Reports HA have resolved ?4. Mood: LCSW to follow for evaluation and support.  ?            -antipsychotic agents: N/a ?5. Neuropsych: This patient may be intermittently capable of making decisions on her own behalf. ?6. Skin/Wound Care: Routine pressure relief measuress.  ?7. Fluids/Electrolytes/Nutrition: Monitor I/O. Encourage PO  ?8. 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            --on decadron 4 mg every 6 hours for midline shift/cerebral edema.  ? 4/24 continue Decadron per rad onc.  ?9. Lung cancer/COPD: On maintenance immunotherapy every 3 weeks of Atezolizumab               ?--Breo/ellipta bid ?-has xrt again tomorrow ?10. Constipation: Will start  miralax daily ? 4/24 moving bowels now,   ?11. Leucocytosis: No signs of fevers or infection.  ?  4/24--14k, likely steroid effect ? ?  ? ? ? ?LOS: ?5 days ?A FACE TO FACE EVALUATION WAS PERFORMED ? ?Meredith Staggers ?01/08/2022, 10:52 AM  ? ? ? ?

## 2022-01-08 NOTE — Progress Notes (Signed)
Physical Therapy Session Note ? ?Patient Details  ?Name: [name] ?MRN: [id number] ?Date of Birth: [birth year] ? ?Today's Date: 01/08/2022 ?PT Individual Time: 1130-1200 ?PT Individual Time Calculation (min): 30 min  ? ?Short Term Goals: ?Week 1:  PT Short Term Goal 1 (Week 1): =LTGs d/t ELOS ? ?Skilled Therapeutic Interventions/Progress Updates:  ?   ?Patient in bed upon PT arrival. Patient alert and agreeable to PT session. Patient denied pain during session. ? ?Therapeutic Activity: ?Bed Mobility: Patient performed supine to/from sit with mod I with HOB elevated.  ?Transfers: Patient performed sit to/from stand x3 with supervision for safety. Provided verbal cues for forward weight shift x1. ? ?Gait Training:  ?Patient ambulated >100 feet x2 without an AD with close supervision. Ambulated with decreased gait speed, decreased step length and height, mild ataxia with intermittent weaving R/L, forward trunk lean, and downward head gaze. Provided verbal cues for increased gait speed and arm swing for improved balance and reduced sway. ?6 Min Walk Test:  ?Instructed patient to ambulate as quickly and as safely as possible for 6 minutes using LRAD. Patient was allowed to take standing rest breaks without stopping the test, but if the patient required a sitting rest break the clock would be stopped and the test would be over.  ?Results: 810 feet (247 meters, Avg speed 0.7 m/s) without an AD with close sueprvision. Results indicate that the patient has reduced endurance with ambulation compared to age matched norms.  ?Age Matched Norms: 7-79 yo F: 471 meters ?MDC: 58.21 meters (190.98 feet) or 50 meters ?(ANPTA Core Set of Outcome Measures for Adults with Neurologic Conditions, 2018) ? ?Reviewed results of outcome measures performed yesterday and today with patient at end of session.  ? ?Patient in sitting EOB handed off to LPN in the room at end of session. ? ?Therapy Documentation ?Precautions:   ?Precautions ?Precautions: Fall ?Restrictions ?Weight Bearing Restrictions: No ? ? ? ?Therapy/Group: Individual Therapy ? ?Doreene Burke PT, DPT ? ?01/08/2022, 12:01 PM  ?

## 2022-01-08 NOTE — Plan of Care (Signed)
?  Problem: RH Problem Solving ?Goal: LTG Patient will demonstrate problem solving for (SLP) ?Description: LTG:  Patient will demonstrate problem solving for basic/complex daily situations with cues  (SLP) ?Flowsheets (Taken 01/08/2022 1015) ?LTG Patient will demonstrate problem solving for: Supervision ?Note: Goal downgraded due to requiring ongoing cues  ?  ?Problem: RH Memory ?Goal: LTG Patient will use memory compensatory aids to (SLP) ?Description: LTG:  Patient will use memory compensatory aids to recall biographical/new, daily complex information with cues (SLP) ?Flowsheets (Taken 01/08/2022 1015) ?LTG: Patient will use memory compensatory aids to (SLP): Supervision ?Note: Goal downgraded due to requiring ongoing cues  ?  ?

## 2022-01-08 NOTE — Progress Notes (Signed)
Occupational Therapy Session Note ? ?Patient Details  ?Name: [name] ?MRN: [id number] ?Date of Birth: 1950/09/28 ? ?Session 1 ? ?Today's Date: 01/08/2022 ?OT Individual Time: 6431-4276 ?OT Individual Time Calculation (min): 75 min  ? ?Session 2 ? ?Today's Date: 01/08/2022 ?OT Individual Time: 1406-1500 ?OT Individual Time Calculation (min): 54 min  ? ? ?Short Term Goals: ?Week 1:  OT Short Term Goal 1 (Week 1): STG=LTG d/t ELOS ? ?Skilled Therapeutic Interventions/Progress Updates:  ?  Pt received sitting up in the recliner with no c/o pain, agreeable to OT session. She completed 200 ft of functional mobility to the therapy gym with (S). She transferred onto the [facility] and completed 600 ft of functional mobility (harness on but no body weight support), while OT added dynamic targets to not step on to challenge dynamic balance and righting reactions. She demonstrated improved balance and functional activity tolerance. She took a seated rest break before completing another 300 ft backward walking to challenge gastroc strengthening bilaterally and as well as general posterior chain. Min cueing for proper body mechanics. She required another rest break following. She then completed full body strengthening and dynamic balance activity- collecting several weighted balls in a laundry basket and then completed 50 additional ft of functional mobility. She required CGA- (S) overall. 2 trials completed with similar performance. She then completed core stability exercises sitting on a therapy ball with cueing/instruction for challenging BOS and introducing various balance perturbations. She was able to maintain balance with CGA overall. She returned to her room and was left supine with all needs met, bed alarm set.  ? ? ?Session 2 ?Pt received supine with no c/o pain, agreeable to OT session. She completed toileting tasks with (S) overall. She completed 100 ft of functional mobility to the therapy gym with (S). She  transitioned into quadruped and completed dynamic reaching with weight shift R/L with a 4lb dumbbell to challenge UE strengthening and core stability. She then completed functional overhead reach combined with a squat to challenge full body strengthening and full body endurance. She required mod cueing/demonstration for proper technique. She returned to her room for a shower, 150 ft with (S) overall. She completed all steps leading up to the shower with close (S) in the room, gathering supplies and clothes. She showered in standing with BSC posteriorly for support with close (S). She did require cueing/education to complete more distal LE bathing seated, as she likes to put her feet up on the chair. She completed dressing from EOB with (S) as well as grooming tasks in standing. She was left supine with all needs met, bed alarm set.  ? ? ?Therapy Documentation ?Precautions:  ?Precautions ?Precautions: Fall ?Restrictions ?Weight Bearing Restrictions: No ? ? ?Therapy/Group: Individual Therapy ? ?Curtis Sites ?01/08/2022, 6:41 AM ?

## 2022-01-08 NOTE — Patient Care Conference (Signed)
Inpatient RehabilitationTeam Conference and Plan of Care Update ?Date: 01/08/2022   Time: 10:34 AM  ? ? ?Patient Name: [name]      ?Medical Record Number: [id number]  ?Date of Birth: 1950/10/15 ?Sex: Female         ?Room/Bed: 4M04C/4M04C-01 ?Payor Info: Payor: Teacher, music MEDICARE / Plan: BCBS MEDICARE / Product Type: *No Product type* /   ? ?Admit Date/Time:  01/03/2022  2:37 PM ? 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 ?Hospital Problems: Principal Problem: ?  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 ? ? ?Expected Discharge Date: Expected Discharge Date: 01/11/22 ? ?Team Members Present: ?Physician leading conference: Dr. Alger Simons ?Social Worker Present: Loralee Pacas, LCSWA ?Nurse Present: Dorthula Nettles, RN ?PT Present: Apolinar Junes, PT ?OT Present: Laverle Hobby, OT ?SLP Present: Weston Anna, SLP ?PPS Coordinator present : Gunnar Fusi, SLP ? ?   Current Status/Progress Goal Weekly Team Focus  ?Bowel/Bladder ? ? cont B/B . last BM 4/24  remain cont.  assess q shift and PRN   ?Swallow/Nutrition/ Hydration ? ?           ?ADL's ? ? (S) overall, limited by activity tolerance and balance deficits, occasional safety awareness deficits  mod I overall  functional activity tolerance, ADLs, balance, safety   ?Mobility ? ? Supervision overall, Berg 54/56, FGA 23/30, 5TSTS 12.5 sec  Mod I overall  balance, visual retraining, activity tolerance, dynamic gait training, d/c planning, patient/caregiver education   ?Communication ? ?           ?Safety/Cognition/ Behavioral Observations ? Supervision  Mod I  complex problem solving, recall with use of compensatory strategies   ?Pain ? ? denies pain  pain<3  assess pain q shift and PRN   ?Skin ? ? intact.  remain intact  assess skin q shift and PRN   ? ? ?Discharge Planning:  ?Pt will d/c to home with her husband who is not physclaly able to assist, only able to provide supervision. Pt sister likely to only remain in [place] for 1 week as she  is coming for pt scheduled surgery on 4/28.   ?Team Discussion: ?Scheduled for surgery Friday. Currently having radiation treatment. Discontinue IV. Continent B/B, no reported pain. Sister coming in for surgery. Husband can't physically assist. ? ?Patient on target to meet rehab goals: ?yes, mod I goals. Currently supervision overall. Activity tolerance improved. Working on recall and awareness. ? ?*See Care Plan and progress notes for long and short-term goals.  ? ?Revisions to Treatment Plan:  ?Adjusting medications, finalizing plans for surgery. ?  ?Teaching Needs: ?Family education, medication management, transfer/gait training, etc. ?  ?Current Barriers to Discharge: ?Decreased caregiver support, Home enviroment access/layout, Lack of/limited family support, Pending surgery, and Pending chemo/radiation ? ?Possible Resolutions to Barriers: ?Family education ?Radiation ?Surgery ?  ? ? Medical Summary ?Current Status: metastatic lung cancer to brain. receiving XRT prior to craniotomy/resection friday. pain controlled. ? Barriers to Discharge: Medical stability ?  ?Possible Resolutions to [employer]: daily assessment of labs, ongoing radiation, to surgery friday ? ? ?Continued Need for Acute Rehabilitation Level of Care: The patient requires daily medical management by a physician with specialized training in physical medicine and rehabilitation for the following reasons: ?Direction of a multidisciplinary physical rehabilitation program to maximize functional independence : Yes ?Medical management of patient stability for increased activity during participation in an intensive rehabilitation regime.: Yes ?Analysis of laboratory values and/or radiology reports with any subsequent need  for medication adjustment and/or medical intervention. : Yes ? ? ?I attest that I was present, lead the team conference, and concur with the assessment and plan of the team. ? ? ?Dorthula Nettles G ?01/08/2022, 1:44 PM   ? ? ? ? ? ? ?

## 2022-01-08 NOTE — Discharge Instructions (Addendum)
Inpatient Rehab Discharge Instructions ? ?[name] ?Discharge date and time:   ? ?Activities/Precautions/ Functional Status: ?Activity: no lifting, driving, or strenuous exercise till cleared by MD ?Diet: regular diet ?Wound Care: none needed ? ? ?Functional status:  ?___ No restrictions     ___ Walk up steps independently ?_X__ 24/7 supervision/assistance   ___ Walk up steps with assistance ?___ Intermittent supervision/assistance  ___ Bathe/dress independently ?___ Walk with walker     ___ Bathe/dress with assistance ?___ Walk Independently    ___ Shower independently ?___ Walk with assistance    _X__ Shower with assistance ?_X__ No alcohol     ___ Return to work/school ________ ? ?Special Instructions: ? ? ? ?My questions have been answered and I understand these instructions. I will adhere to these goals and the provided educational materials after my discharge from the hospital. ? ?Patient/Caregiver Signature _______________________________ Date __________ ? ?Clinician Signature _______________________________________ Date __________ ? ?Please bring this form and your medication list with you to all your follow-up doctor's appointments.   ?

## 2022-01-08 NOTE — Progress Notes (Signed)
Speech Language Pathology Daily Session Note ? ?Patient Details  ?Name: [name] ?MRN: [id number] ?Date of Birth: [birth year] ? ?Today's Date: 01/08/2022 ?SLP Individual Time: 3005-1102 ?SLP Individual Time Calculation (min): 55 min ? ?Short Term Goals: ?Week 1: SLP Short Term Goal 1 (Week 1): STGs=LTGs due to ELOS ? ?Skilled Therapeutic Interventions: Skilled treatment session focused on cognitive goals. Upon arrival, patient was awake in bed and agreeable to ST session. Patient requested to stand at the sink to perform grooming tasks in which she performed with supervision. SLP also facilitated session by providing extra time and overall supervision-Min question cues for functional problem solving, recall of information and organization during a complex appointment and scheduling task. Patient with appropriate awareness regarding difficulty of task. Patient left upright in recliner with alarm on and all needs within reach. Continue with current plan of care.  ?   ? ?Pain ?No/Denies Pain  ? ?Therapy/Group: Individual Therapy ? ?[name] ?01/08/2022, 10:08 AM ?

## 2022-01-09 ENCOUNTER — Other Ambulatory Visit: Payer: Self-pay

## 2022-01-09 ENCOUNTER — Ambulatory Visit
Admit: 2022-01-09 | Discharge: 2022-01-09 | Disposition: A | Discharge status: Home / Self Care | Payer: Medicare Other | Attending: Radiation Oncology | Admitting: Radiation Oncology

## 2022-01-09 ENCOUNTER — Encounter: Payer: Self-pay | Admitting: Urology

## 2022-01-09 DIAGNOSIS — C7931 Secondary malignant neoplasm of brain: Secondary | ICD-10-CM

## 2022-01-09 DIAGNOSIS — C3490 Malignant neoplasm of unspecified part of unspecified bronchus or lung: Secondary | ICD-10-CM

## 2022-01-09 LAB — RAD ONC ARIA SESSION SUMMARY
Course Elapsed Days: 6
Plan Fractions Treated to Date: 3
Plan Prescribed Dose Per Fraction: 8 Gy
Plan Total Fractions Prescribed: 3
Plan Total Prescribed Dose: 24 Gy
Reference Point Dosage Given to Date: 24 Gy
Reference Point Session Dosage Given: 8 Gy
Session Number: 3

## 2022-01-09 NOTE — Progress Notes (Signed)
Occupational Therapy Discharge Summary ? ?Patient Details  ?Name: [name] ?MRN: [id number] ?Date of Birth: 1951-07-08 ? ?1:1 10-11 am GRAD DAY! Self care retraining at shower level. Pt navigating around the room without AD at mod I level. PT stood in the shower to shower with distant supervision to mod I. Pt able to dry off and ambulate back to the bed to dress mod I. Pt able to demonstrate ability to safe mobility while reaching down to low items and things above her shoulders. Pt did ask assistance to help fix her hair. Education and discussion about community functional mobility; discussed options of AD. Pt was able to perform safer functional mobility with RW without a sway and any LOB; being mod I. Pt also asked about using a Rollator vs a RW. Trial use of Rollator >400 feet mod I. Discussed looking into this option after sx scheduled tomorrow.  ? ? ?Patient has met 6 of 6 long term goals due to improved activity tolerance, improved balance, postural control, ability to compensate for deficits, improved attention, improved awareness, and improved coordination.  Patient to discharge at overall Modified Independent level. [name] has made excellent gains in CIR- her activity tolerance, generalized strength, and dynamic balance have all improved, lowering her overall fall risk. She will return to acute for scheduled surgery and the acute therapy team will determine her d/c needs following post op.  ? ?Recommendation:  ?OT will determine f/u recommendations following brain tumor resection sx ? ?Equipment: ?No equipment provided ? ?Reasons for discharge:  Surgery scheduled, back to acute ? ?Patient/family agrees with progress made and goals achieved: Yes ? ?OT Discharge ?Precautions/Restrictions  ?Precautions ?Precautions: None ?Restrictions ?Weight Bearing Restrictions: No ?  ?ADL ?ADL ?Eating: Independent ?Where Assessed-Eating: Edge of bed ?Grooming: Independent ?Where Assessed-Grooming: Standing at  sink ?Upper Body Bathing: Independent ?Where Assessed-Upper Body Bathing: Shower ?Lower Body Bathing: Setup ?Where Assessed-Lower Body Bathing: Shower ?Upper Body Dressing: Modified independent (Device) ?Where Assessed-Upper Body Dressing: Chair ?Lower Body Dressing: Modified independent ?Where Assessed-Lower Body Dressing: Sitting at sink ?Toileting: Modified independent ?Where Assessed-Toileting: Toilet ?Toilet Transfer: Modified independent ?Toilet Transfer Method: Ambulating ?Walk-In Shower Transfer: Modified independent ?Walk-In Shower Transfer Method: Ambulating ?Walk-In Shower Equipment: Civil engineer, contracting with back ?Vision ?Baseline Vision/History: 1 Wears glasses ?Patient Visual Report: Blurring of vision ?Vision Assessment?: No apparent visual deficits ?Perception  ?Perception: Impaired ?Inattention/Neglect: Does not attend to left visual field (very mild L inattention- much improved) ?Praxis ?Praxis: Intact ?Cognition ?Cognition ?Overall Cognitive Status: Impaired/Different from baseline ?Arousal/Alertness: Awake/alert ?Orientation Level: Person;Place;Situation ?Person: Oriented ?Place: Oriented ?Situation: Oriented ?Memory: Impaired ?Memory Impairment: Decreased short term memory ?Decreased Short Term Memory: Verbal complex;Functional complex ?Attention: Selective ?Awareness: Appears intact ?Problem Solving: Appears intact ?Safety/Judgment: Appears intact ?Comments: Slight safety awareness deficits during bathing/dressing ?Brief Interview for Mental Status (BIMS) ?Repetition of Three Words (First Attempt): 3 ?Temporal Orientation: Year: Correct ?Temporal Orientation: Month: Accurate within 5 days ?Temporal Orientation: Day: Correct ?Recall: "Sock": Yes, no cue required ?Recall: "Blue": Yes, no cue required ?Recall: "Bed": Yes, no cue required ?BIMS Summary Score: 15 ?Sensation ?Sensation ?Light Touch: Appears Intact ?Hot/Cold: Appears Intact ?Proprioception: Appears Intact ?Coordination ?Gross Motor Movements  are Fluid and Coordinated: Yes ?Fine Motor Movements are Fluid and Coordinated: Yes ?Motor  ?Motor ?Motor: Within Functional Limits ?Mobility  ?Bed Mobility ?Bed Mobility: Sit to Supine;Supine to Sit ?Supine to Sit: Independent ?Sit to Supine: Independent ?Transfers ?Sit to Stand: Independent with assistive device ?Stand to Sit: Independent with assistive device  ?Trunk/Postural Assessment  ?  Cervical Assessment ?Cervical Assessment: Within Functional Limits ?Thoracic Assessment ?Thoracic Assessment: Within Functional Limits ?Lumbar Assessment ?Lumbar Assessment: Within Functional Limits ?Postural Control ?Postural Control: Deficits on evaluation ?Righting Reactions: slightly delayed  ?Balance ?Balance ?Balance Assessed: Yes ?Dynamic Sitting Balance ?Dynamic Sitting - Balance Support: Feet supported ?Dynamic Sitting - Level of Assistance: 6: Modified independent (Device/Increase time) ?Dynamic Standing Balance ?Dynamic Standing - Balance Support: During functional activity ?Dynamic Standing - Level of Assistance: 6: Modified independent (Device/Increase time) ?Extremity/Trunk Assessment ?RUE Assessment ?RUE Assessment: Within Functional Limits ?LUE Assessment ?LUE Assessment: Within Functional Limits ? ? ?Curtis Sites ?01/09/2022, 7:53 AM ?

## 2022-01-09 NOTE — Progress Notes (Signed)
Occupational Therapy Session Note ? ?Patient Details  ?Name: [name] ?MRN: [id number] ?Date of Birth: 05-Sep-1951 ? ?Today's Date: 01/09/2022 ?OT Individual Time: 8295-6213 ?OT Individual Time Calculation (min): 55 min  ? ? ?Short Term Goals: ?Week 1:  OT Short Term Goal 1 (Week 1): STG=LTG d/t ELOS ? ?Skilled Therapeutic Interventions/Progress Updates:  ?  Pt received supine with no c/o pain, agreeable to OT session. She completed dressing EOB with mod I overall. Oral care and grooming tasks at the sink with mod I in standing. Toileting with close (S) overall. She completed 150 ft of functional mobility to the therapy gym with several large postural sways but no overt LOB. In the gym she completed R/L "wood chops"- functional squat with a diagonal reach with a 6.6 lb medicine ball to add a strength and functional activity tolerance. She was very successful in carryover of form from yesterday's cueing. She completed 3x 10 repetitions with an extended rest break between. She then completed righting reaction training on the BOSU in the parallel bars with cueing for self monitoring of balance strategies. Improved use of widening BOS for stability. She returned to her room with (S), 100 ft of functional mobility. Pt was left sitting up in the recliner with all needs met, chair alarm set, and call bell within reach.   ? ? ?Therapy Documentation ?Precautions:  ?Precautions ?Precautions: Fall ?Restrictions ?Weight Bearing Restrictions: No ? ?Therapy/Group: Individual Therapy ? ?Curtis Sites ?01/09/2022, 6:39 AM ?

## 2022-01-09 NOTE — Discharge Summary (Signed)
Physician Discharge Summary  ?Patient ID: ?[name] ?MRN: [id number] ?DOB/AGE: [birth year] 71 y.o. ? ?Admit date: 01/03/2022 ?Discharge date: 01/11/2022 ? ?Discharge Diagnoses:  ?Principal Problem: ?  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  Hypothyroidism ?  Leucocytosis ?  Constipation ?  S/P radiation therapy within last four weeks ? ? ?Discharged Condition: good ? ?Significant Diagnostic Studies: N/A ? ? ?Labs:  ?Basic Metabolic Panel: ? ?  Latest Ref Rng & Units 01/07/2022  ?  5:58 AM 01/04/2022  ?  5:33 AM 12/31/2021  ?  4:51 AM  ?BMP  ?Glucose 70 - 99 mg/dL 119   112   117    ?BUN 8 - 23 mg/dL 14   16   12     ?Creatinine 0.44 - 1.00 mg/dL 0.55   0.59   0.43    ?Sodium 135 - 145 mmol/L 137   138   140    ?Potassium 3.5 - 5.1 mmol/L 4.6   3.7   3.8    ?Chloride 98 - 111 mmol/L 101   100   108    ?CO2 22 - 32 mmol/L 28   29   24     ?Calcium 8.9 - 10.3 mg/dL 8.8   9.1   9.3    ?  ? ?CBC: ? ?  Latest Ref Rng & Units 01/07/2022  ?  5:58 AM 01/04/2022  ?  5:33 AM 12/31/2021  ?  4:51 AM  ?CBC  ?WBC 4.0 - 10.5 K/uL 14.3   13.1   10.9    ?Hemoglobin 12.0 - 15.0 g/dL 13.0   13.7   13.4    ?Hematocrit 36.0 - 46.0 % 38.7   40.5   39.5    ?Platelets 150 - 400 K/uL 263   287   226    ?  ? ?CBG: ?No results for input(s): GLUCAP in the last 168 hours. ? ?Brief HPI:   [name] is a 71 y.o. female with history of COPD, hypothyroid, depression, NSCLC on maintenance therapy but recent Covid and had missed last 2 immunotherapy treatments. She was admitted to [facility] on 12/30/21 with dizziness, severe HA and falls due to solitary right occipital lobe enhancing lesion with extensive white matter edema and 11 mm midline shift. She was started on IV steroids with improvement and started on  3 treatment of pre-op XRT with plans for surgical resection by Dr. Ronnald Ramp on 05/29. She continued to be LUE inattention, intermittent confusion and balance deficits affecting ADLs and mobility. CIR was recommended  due to functional decline.  ? ? ?Hospital Course: [name] was admitted to rehab 01/03/2022 for inpatient therapies to consist of PT, ST and OT at least three hours five days a week. Past admission physiatrist, therapy team and rehab RN have worked together to provide customized collaborative inpatient rehab. Her blood pressures were monitored on TID basis and has been stable. She has tolerated XRT without side effects. She continues on decadron IV 4 mg every 6 hours and taper to be advised per Radiation Onc/Hematology.  Respiratory status is stable on Breo ellipta. Miralax was added to help manage constipation and patient has had good results with this.  Follow up CBC showed leucocytosis felt to be steroid mediated as patient without fevers or other signs of infection. H/H is stable. Check of BMET showed that electrolytes and renal status to be WNL. Her mood has been stable and has progressed  to modified independent level. She was discharge to acute hospital  for surgical resection of tumor on 01/11/22.  ? ? ?Rehab course: During patient's stay in rehab  team conference was held to monitor patient's progress, set goals and discuss barriers to discharge. At admission, patient required min assist with mobility and CGA with ADL tasks. She exhibited decrease in STM with mild left inattention and mild impairments in orientation.  She  has had improvement in activity tolerance, balance, postural control as well as ability to compensate for deficits. She is able to complete ADL tasks at modified independent level. She is modified independent for transfers and to ambulate 800' with RW.  She is mod independent to supervision to mildly complex and functional tasks, functional problem solving and recall with use of compensatory strategies.   ? ?  ?Current Medications:  ? sodium chloride   Intravenous Once  ? Chlorhexidine Gluconate Cloth  6 each Topical Daily  ? dexamethasone (DECADRON) injection  4 mg Intravenous Q6H  ?  fluticasone  1 spray Each Nare Daily  ? fluticasone furoate-vilanterol  1 puff Inhalation Daily  ? levothyroxine  75 mcg Oral QAC breakfast  ? mupirocin ointment  1 application. Nasal BID  ? polyethylene glycol  17 g Oral QODAY  ?  ?Diet: Regular.  ? ?Special Instructions: Steroid wean to be determined by Rad Onc/Hematology.  ? ?Disposition: Acute hospital  ? ? Follow-up Information   ? ? [name], PA Follow up.   ?Specialty: Family Medicine ?Why: call for follow up appointment ?Contact information: ?[address] ?[place] [address] ?[phone] ? ? ?  ?  ? ? Meredith Staggers, MD Follow up.   ?Specialty: Physical Medicine and Rehabilitation ?Why: office will call you with follow up appointment ?Contact information: ?[address] ?[address] ?[place] [address] ?[phone] ? ? ?  ?  ? ? Newman Pies, MD Follow up.   ?Specialty: Neurosurgery ?Why: call for follow up appointment ?Contact information: ?[address] ?[address] ?[place] [address] ?[phone] ? ? ?  ?  ? ?  ?  ? ?  ? ? ?Signed: ?Bary Leriche ?01/11/2022, 9:44 AM ?  ?

## 2022-01-09 NOTE — Progress Notes (Signed)
?                                                       PROGRESS NOTE ? ? ?Subjective/Complaints: ? ?Pt tolerated XRT yesterday without issues. Continues to progress with therapy. Denies pain ? ?ROS: Patient denies fever, rash, sore throat, blurred vision, dizziness, nausea, vomiting, diarrhea, cough, shortness of breath or chest pain, joint or back/neck pain, headache, or mood change.  ? ?Objective: ?  ?No results found. ?Recent Labs  ?  01/07/22 ?2993  ?WBC 14.3*  ?HGB 13.0  ?HCT 38.7  ?PLT 263  ? ?Recent Labs  ?  01/07/22 ?7169  ?NA 137  ?K 4.6  ?CL 101  ?CO2 28  ?GLUCOSE 119*  ?BUN 14  ?CREATININE 0.55  ?CALCIUM 8.8*  ? ? ?Intake/Output Summary (Last 24 hours) at 01/09/2022 0811 ?Last data filed at 01/08/2022 1856 ?Gross per 24 hour  ?Intake 440 ml  ?Output --  ?Net 440 ml  ?  ? ?  ? ?Physical Exam: ?Vital Signs ?Blood pressure (!) 109/56, pulse (!) 57, temperature 98.5 ?F (36.9 ?C), temperature source Oral, resp. rate 14, height 5' 2.5" (1.588 m), weight 51.6 kg, SpO2 97 %. ? ? ? ?Constitutional: No distress . Vital signs reviewed. ?HEENT: NCAT, EOMI, oral membranes moist ?Neck: supple ?Cardiovascular: RRR without murmur. No JVD    ?Respiratory/Chest: CTA Bilaterally without wheezes or rales. Normal effort    ?GI/Abdomen: BS +, non-tender, non-distended ?Ext: no clubbing, cyanosis, or edema ?Psych: pleasant and cooperative  ?Musculoskeletal:  ?   Cervical back: Neck supple. No tenderness.  ?   ?Skin: ?   General: Skin is warm and dry.  ?   Comments:IV still in place. No signs of breakdown  ?Neurological:  ?   Mental Status: She is alert and oriented to person, place, and time.  ?   Comments: Speech clear. Alert and appropriate. She ws asble to follow basic one an two step commands without difficulty.  ?Ox3-. Moves all 4's 4+/5. No focal sensory findings. No limb ataxia  ? ? ?Assessment/Plan: ?1. Functional deficits which require 3+ hours per day of interdisciplinary therapy in a comprehensive inpatient rehab  setting. ?Physiatrist is providing close team supervision and 24 hour management of active medical problems listed below. ?Physiatrist and rehab team continue to assess barriers to discharge/monitor patient progress toward functional and medical goals ? ?Care Tool: ? ?Bathing ?   ?Body parts bathed by patient: Right arm, Right lower leg, Left arm, Left lower leg, Chest, Abdomen, Face, Front perineal area, Buttocks, Right upper leg, Left upper leg  ?   ?  ?  ?Bathing assist Assist Level: Set up assist ?  ?  ?Upper Body Dressing/Undressing ?Upper body dressing   ?What is the patient wearing?: Pull over shirt ?   ?Upper body assist Assist Level: Independent ?   ?Lower Body Dressing/Undressing ?Lower body dressing ? ? ? Lower body dressing activity did not occur:  (Pt did her oown lower body dressings) ?What is the patient wearing?: Underwear/pull up, Pants ? ?  ? ?Lower body assist Assist for lower body dressing: Independent ?   ? ?Toileting ?Toileting    ?Toileting assist Assist for toileting: Independent ?  ?  ?Transfers ?Chair/bed transfer ? ?Transfers assist ?   ? ?Chair/bed transfer assist level: Independent ?  ?  ?  Locomotion ?Ambulation ? ? ?Ambulation assist ? ?   ? ?Assist level: Minimal Assistance - Patient > 75% ?Assistive device: No Device ?Max distance: 150'  ? ?Walk 10 feet activity ? ? ?Assist ?   ? ?Assist level: Contact Guard/Touching assist ?Assistive device: Walker-rolling  ? ?Walk 50 feet activity ? ? ?Assist   ? ?Assist level: Contact Guard/Touching assist ?Assistive device: Walker-rolling  ? ? ?Walk 150 feet activity ? ? ?Assist Walk 150 feet activity did not occur: Safety/medical concerns ? ?  ?  ?  ? ?Walk 10 feet on uneven surface  ?activity ? ? ?Assist   ? ? ?Assist level: Contact Guard/Touching assist ?Assistive device: Walker-rolling  ? ?Wheelchair ? ? ? ? ?Assist Is the patient using a wheelchair?: No ?  ?  ? ?  ?   ? ? ?Wheelchair 50 feet with 2 turns activity ? ? ? ?Assist ? ?  ?  ? ? ?    ? ?Wheelchair 150 feet activity  ? ? ? ?Assist ?   ? ? ?   ? ?Blood pressure (!) 109/56, pulse (!) 57, temperature 98.5 ?F (36.9 ?C), temperature source Oral, resp. rate 14, height 5' 2.5" (1.588 m), weight 51.6 kg, SpO2 97 %. ? ?Medical Problem List and Plan: ?1. 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            -patient may  shower ?            -ELOS/Goals: 4/28---for craniotomy/resection, mod I to supervision ? -Continue CIR therapies including PT, OT  ?2.  Antithrombotics: ?-DVT/anticoagulation:  Mechanical: Sequential compression devices, below knee Bilateral lower extremities ?            -antiplatelet therapy: N/A ?3. Pain Management: pain well controlled ?4. Mood: LCSW to follow for evaluation and support.  ?            -antipsychotic agents: N/a ?5. Neuropsych: This patient may be intermittently capable of making decisions on her own behalf. ?6. Skin/Wound Care: Routine pressure relief measuress.  ?7. Fluids/Electrolytes/Nutrition: Monitor I/O. Encourage PO  ?8. 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            --on decadron 4 mg every 6 hours for midline shift/cerebral edema.  ? 4/24 continue Decadron per rad onc IV form ?9. Lung cancer/COPD: On maintenance immunotherapy every 3 weeks of Atezolizumab               ?--Breo/ellipta bid ?-has xrt again today ?10. Constipation: Will start  miralax daily ? 4/24 moving bowels now,   ?11. Leucocytosis: No signs of fevers or infection.  ?  4/24--14k, likely steroid effect ? ?  ? ? ? ?LOS: ?6 days ?A FACE TO FACE EVALUATION WAS PERFORMED ? ?Meredith Staggers ?01/09/2022, 8:11 AM  ? ? ? ?

## 2022-01-09 NOTE — Progress Notes (Signed)
Physical Therapy Note ? ?Patient Details  ?Name: [name] ?MRN: [id number] ?Date of Birth: [birth year] ?Today's Date: 01/09/2022 ? ? ? ?Attempted to see patient for scheduled therapy session. Pt currently unavailable as she is off the unit for radiation treatment. Pt missed 45 min of scheduled skilled therapy minutes, will attempt to make up per POC.  ? ? ?Excell Seltzer, PT, DPT, CSRS ?01/09/2022, 3:44 PM  ?

## 2022-01-09 NOTE — Progress Notes (Signed)
Physical Therapy Session Note ? ?Patient Details  ?Name: [name] ?MRN: [id number] ?Date of Birth: [birth year] ? ?Today's Date: 01/09/2022 ?PT Individual Time: 0900-1000 ?PT Individual Time Calculation (min): 60 min  ? ?Short Term Goals: ?Week 1:  PT Short Term Goal 1 (Week 1): =LTGs d/t ELOS ? ?Skilled Therapeutic Interventions/Progress Updates:  ?   ?Patient in recliner upon PT arrival. Patient alert and agreeable to PT session. Patient denied pain during session. ? ?Therapeutic Activity: ?Transfers: Patient performed sit to/from stand x5 independently without an AD. Performed an ambulatory toilet transfer with distant supervision, continent of bladder, performed peri-care, lower body clothing management and hand hygiene independently.  ? ?Gait Training:  ?Patient ambulated >150 feet x2 without an AD with supervision. Ambulated with decreased gait speed, decreased step length and height, mild ataxia with intermittent weaving R/L, forward trunk lean, and downward head gaze. Provided verbal cues for increased gait speed and arm swing for improved balance and reduced sway. ? ?Neuromuscular Re-ed: ?Patient performed the following standing balance activities: ?-Biodex Limits of stability Level 11 ? Trial 1: practice with B upper extremity support ? Trial 2: 1:06 33% 1 upper extremity support ? Trial 3: 55 sec 41% B single finger support ? Trial 4: 104 32% single finger support ? Trial 5: 2:12 14% no upper extremity support  ? Trial 6: 1:52 16% no upper extremity support ? Trial 7: 1:39 15% no upper extremity support ?Supervision throughout, noted increased challenge with anterior weight shift. ?Educated on balance strategies and loss of ankle/hip strategies with increased reliance on reaching and stepping strategies.  ?-Tandem walking on green line x2 with CGA-close supervision for transfer of ankle/hip strategies in a walking activity ? ?Patient in recliner at end of session with breaks locked, seat belt alarm set,  and all needs within reach.  ? ?Therapy Documentation ?Precautions:  ?Precautions ?Precautions: None ?Restrictions ?Weight Bearing Restrictions: No ? ? ? ?Therapy/Group: Individual Therapy ? ?Doreene Burke PT, DPT ? ?01/09/2022, 12:15 PM  ?

## 2022-01-09 NOTE — Progress Notes (Incomplete)
Physical Therapy Discharge Summary ? ?Patient Details  ?Name: [name] ?MRN: [id number] ?Date of Birth: September 27, 1950 ? ?{CHL IP REHAB PT TIME CALCULATION:304800500} ? ? ?Patient has met {NUMBERS 0-12:18577} of 4 long term goals due to improved activity tolerance, improved balance, improved postural control, ability to compensate for deficits, and improved coordination.  Patient to discharge at an ambulatory level Supervision.   Patient's care partner {care partner:[id number]} to provide the necessary {assistance:[id number]} assistance at discharge. ? ?Reasons goals not met: *** ? ?Recommendation:  ?Patient will benefit from ongoing skilled PT services following surgical intervention. Next level of care to be determined by Acute PT assessment post op to continue to advance safe functional mobility, address ongoing and possible new impairments, and minimize fall risk. ? ?Equipment: ?No equipment provided ? ?Reasons for discharge: treatment goals met and discharge for surgical intervention. ? ?Patient/family agrees with progress made and goals achieved: Yes ? ?PT Discharge ?Precautions/Restrictions ?  ?Vital Signs ?Therapy Vitals ?Temp: 98.5 ?F (36.9 ?C) ?Temp Source: Oral ?Pulse Rate: (!) 57 ?Resp: 14 ?BP: (!) 109/56 ?Patient Position (if appropriate): Lying ?Oxygen Therapy ?SpO2: 98 % ?O2 Device: Room Air ?Pain ?  ?Pain Interference ?  ?Vision/Perception  ?   ?Cognition ?  ?Sensation ?  ?Motor  ?   ?Mobility ?  ?Locomotion  ?   ?Trunk/Postural Assessment  ?   ?Balance ?  ?Extremity Assessment  ?  ?  ?  ?  ? ? ? ?Doreene Burke PT, DPT ? ?01/09/2022, 6:11 AM ?

## 2022-01-10 ENCOUNTER — Other Ambulatory Visit: Payer: Self-pay | Admitting: Radiation Therapy

## 2022-01-10 MED ORDER — MUPIROCIN 2 % EX OINT
1.0000 "application " | TOPICAL_OINTMENT | Freq: Two times a day (BID) | CUTANEOUS | Status: DC
  Administered 2022-01-10 – 2022-01-11 (×2): 1 via NASAL
  Filled 2022-01-10: qty 22

## 2022-01-10 MED ORDER — CHLORHEXIDINE GLUCONATE CLOTH 2 % EX PADS
6.0000 | MEDICATED_PAD | Freq: Every day | CUTANEOUS | Status: DC
Start: 2022-01-11 — End: 2022-01-11
  Administered 2022-01-11: 6 via TOPICAL

## 2022-01-10 NOTE — Progress Notes (Signed)
Speech Language Pathology Discharge Summary ? ?Patient Details  ?Name: [name] ?MRN: [id number] ?Date of Birth: 1951-05-01 ? ? ?Patient has met 2 of 2 long term goals.  Patient to discharge at overall Supervision;Modified Independent level.  ? ?Reasons goals not met: N/A  ? ?Clinical Impression/Discharge Summary: Patient has made functional gains and has met 2 of 2 LTGs this admission. Currently, patient is overall supervision-Mod I to complete mildly complex and functional tasks safely in regards to problem solving and recall with use of compensatory strategies. Patient education is complete but formal family education was not completed as patient is scheduled to discharge to acute on 4/28 for surgery.  ? ?and patient will discharge back to acute tomorrow for a planned  ? ?Care Partner:  ?Caregiver Able to Provide Assistance: Yes  ?Type of Caregiver Assistance: Physical;Cognitive ? ?Recommendation:  ?Patient will benefit from ongoing skilled ST services in acute care s/p surgery to continue to maximize cognitive functioning and overall functional independence. If patient were to d/c home instead of surgery, would recommend OPST at this time. ?   ? ?Equipment: N/A ? ?Reasons for discharge: Treatment goals met with plan to discharge to acute 4/28 for surgery  ? ?Patient/Family Agrees with Progress Made and Goals Achieved: Yes  ? ? ?[name] ?01/10/2022, 3:12 PM ? ?

## 2022-01-10 NOTE — Progress Notes (Signed)
Inpatient Rehabilitation Discharge Medication Review by a Pharmacist ? ?A complete drug regimen review was completed for this patient to identify any potential clinically significant medication issues. ? ?High Risk Drug Classes Is patient taking? Indication by Medication  ?Antipsychotic No   ?Anticoagulant No   ?Antibiotic No   ?Opioid No   ?Antiplatelet No   ?Hypoglycemics/insulin No   ?Vasoactive Medication No   ?Chemotherapy No   ?Other Yes Synthroid- hypothyroidism ?Advair- COPD ?Flonase- seasonal rhinitis  ?Dexamethasone- cerebral edema 2/2 Stage IV SCLCa mets to brain causing leftward midline shift ?Alendronate- osteoporosis ?Calcium- osteoporosis ?Vitamin D- osteoporosis  ? ? ? ?Type of Medication Issue Identified Description of Issue Recommendation(s)  ?Drug Interaction(s) (clinically significant) ?    ?Duplicate Therapy ?    ?Allergy ?    ?No Medication Administration End Date ?    ?Incorrect Dose ?    ?Additional Drug Therapy Needed ?    ?Significant med changes from prior encounter (inform family/care partners about these prior to discharge).    ?Other ?    ? ? ?Clinically significant medication issues were identified that warrant physician communication and completion of prescribed/recommended actions by midnight of the next day:  No ? ?Time spent performing this drug regimen review (minutes):  30 ? ? ?Rayola Everhart BS, PharmD, BCPS ?Clinical Pharmacist ?01/11/2022 10:09 AM ? ?Contact: [phone] after 3 PM ? ?"Be curious, not judgmental..." -Jamal Maes ?

## 2022-01-10 NOTE — Progress Notes (Signed)
Possible sx on 4/28- craniotomy. On call PA was called and following orders were ordered: MRSA swab, EKG, CHG bath, NPO after midnight. Patient was informed and educated. Patient verbalize understanding.  ?

## 2022-01-10 NOTE — Discharge Summary (Signed)
Physical Therapy Discharge Summary ? ?Patient Details  ?Name: [name] ?MRN: [id number] ?Date of Birth: 11-27-1950 ? ? ? ?Patient has met 4 of 4 long term goals due to improved activity tolerance, improved balance, improved postural control, decreased pain, ability to compensate for deficits, improved attention, and improved awareness.  Patient to discharge at an ambulatory level  supervision to modified independent without AD for shorter distances and recommending use of RW for longer distances or pending fatigue level .   Formal family education not completed as plan is for patient to d/c back to acute to undergo crani on 4/28. ? ?Reasons goals not met: n/a - all goals met at this time ? ?Recommendation:  ?Patient will benefit from ongoing skilled PT services in  Acute Care  s/p surgery to continue to advance safe functional mobility, address ongoing impairments in balance, functional endurance, functional mobility, and minimize fall risk. If pt were to d/c home instead of surgery, would recommend OPPT at this time. ? ?Equipment: ?No equipment provided. To be ordered in next venue of care due to d/c back to acute for surgery ? ?Reasons for discharge: treatment goals met and discharge back to acute for planned surgery on 4/28 ? ?Patient/family agrees with progress made and goals achieved: Yes ? ?PT Discharge ?Precautions/Restrictions ?Precautions ?Precautions: None ?Restrictions ?Weight Bearing Restrictions: No ? ?Pain ?Pain Assessment ?Pain Scale: 0-10 ?Pain Score: 0-No pain ?Pain Interference ?Pain Interference ?Pain Effect on Sleep: 1. Rarely or not at all ?Pain Interference with Therapy Activities: 1. Rarely or not at all ?Pain Interference with Day-to-Day Activities: 1. Rarely or not at all ?Vision/Perception  ?Perception ?Perception: Impaired ?Inattention/Neglect: Does not attend to left visual field (mildly noted functionally) ?Praxis ?Praxis: Intact  ?Cognition ?Overall Cognitive Status:  Impaired/Different from baseline ?Attention: Selective ?Selective Attention: Appears intact ?Memory: Impaired ?Memory Impairment: Decreased short term memory ?Awareness: Appears intact ?Problem Solving: Appears intact ?Safety/Judgment: Appears intact ?Sensation ?Sensation ?Light Touch: Appears Intact (does report occasional tingling in feet at night) ?Proprioception: Appears Intact ?Coordination ?Gross Motor Movements are Fluid and Coordinated: Yes ?Heel Shin Test: intact ?Motor  ?Motor ?Motor: Abnormal postural alignment and control ?Motor - Discharge Observations: without AD/UE support pt with decreased balance strategies and reactions especially noted during open spaces and busy environment  ?Mobility ?Bed Mobility ?Bed Mobility: Rolling Left;Supine to Sit;Sit to Supine ?Rolling Left: Independent ?Supine to Sit: Independent ?Sit to Supine: Independent ?Transfers ?Transfers: Sit to Stand;Stand to [employer] Transfers ?Sit to Stand: Independent ?Stand to Sit: Independent ?Stand Pivot Transfers: Independent ?Transfer (Assistive device):  (none or does use RW pending fatigue) ?Locomotion  ?Gait ?Ambulation: Yes ?Gait Assistance: Independent with assistive device;Supervision/Verbal cueing ?Gait Distance (Feet): 800 Feet ?Assistive device: Rolling walker ?Gait Assistance Details: pt can be mod I with RW; focused in therapy on no device to increase balance challenge at supervision level ?Gait ?Gait: Yes ?Gait Pattern: Scissoring ?Stairs / Additional Locomotion ?Stairs: Yes ?Stairs Assistance: Supervision/Verbal cueing;Independent with assistive device ?Stair Management Technique: Two rails ?Number of Stairs: 12 ?Ramp: Supervision/Verbal cueing ?Curb: Independent with assistive device ?Pick up small object from the floor assist level: Independent with assistive device ?Pick up small object from the floor assistive device: RW/UE support on rail or counter  ?Trunk/Postural Assessment  ?Cervical Assessment ?Cervical  Assessment: Within Functional Limits ?Thoracic Assessment ?Thoracic Assessment: Within Functional Limits ?Lumbar Assessment ?Lumbar Assessment: Within Functional Limits ?Postural Control ?Postural Control: Deficits on evaluation ?Protective Responses: slightly delayed, espcially during gait in busy environment/open space  ?Balance ?  Balance ?Balance Assessed: Yes ?Dynamic Sitting Balance ?Dynamic Sitting - Level of Assistance: 6: Modified independent (Device/Increase time) ?Dynamic Standing Balance ?Dynamic Standing - Level of Assistance: 6: Modified independent (Device/Increase time) ?Extremity Assessment  ?  ?  ?RLE Assessment ?RLE Assessment: Within Functional Limits ?LLE Assessment ?LLE Assessment: Within Functional Limits ? ? ? ?Allayne Gitelman ?Lars Masson, PT, DPT, CBIS ? ?01/10/2022, 12:51 PM ?

## 2022-01-10 NOTE — Progress Notes (Signed)
Speech Language Pathology Daily Session Note ? ?Patient Details  ?Name: [name] ?MRN: [id number] ?Date of Birth: 11-28-1950 ? ?Today's Date: 01/10/2022 ?SLP Individual Time: 5271-2929 ?SLP Individual Time Calculation (min): 55 min ? ?Short Term Goals: ?Week 1: SLP Short Term Goal 1 (Week 1): STGs=LTGs due to ELOS ? ?Skilled Therapeutic Interventions: Skilled treatment session focused on cognitive goals. Upon arrival, patient was ambulating in her room without the RW. Patient is now independent in her room and SLP provided education regarding importance of utilizing her RW when up in the room. Patient verbalized understanding. SLP facilitated session by administering the Cognistat. Patient scored WFL on all subtests including orientation and short-term memory. SLP also facilitated session by providing education regarding memory compensatory strategies and the importance of living a healthy brain lifestyle. Patient verbalized understanding and handouts were given to reinforce information. Patient left upright in recliner with all needs within reach.  ?   ? ?Pain ?No/Denies Pain  ? ?Therapy/Group: Individual Therapy ? ?Korena Nass ?01/10/2022, 3:02 PM ?

## 2022-01-10 NOTE — Progress Notes (Signed)
Physical Therapy Session Note ? ?Patient Details  ?Name: [name] ?MRN: [id number] ?Date of Birth: 10/13/1950 ? ?Today's Date: 01/10/2022 ?PT Individual Time: 0160-1093 ?PT Individual Time Calculation (min): 60 min  ? ?Short Term Goals: ?Week 1:  PT Short Term Goal 1 (Week 1): =LTGs d/t ELOS ? ?Skilled Therapeutic Interventions/Progress Updates:  ?  Pt presents in bed and performed bed mobility independently. Pt dons clothing independently EOB including sit <> stands without AD and functional balance mod I. Pt moves around in room at mod I level safely including gathering items and organizing the bed/sheets. Functional gait on unit without AD to challenge balance and focus on balance strategies at overall supervision to CGA due to occasional mild LOB due to lateral sway (mainly to the L), mild scissoring and narrow BOS > 600'. Practiced simulated car transfer at mod I level for d/c preparation. Community mobility training including gait on ramp and uneven surface with use of rail at supervision level due to decreased balance. D/c assessment complete (see d/c note for further details). Administered the following standardized assessments for fall risk assessment and reviewed with patient. Educated and discussed overall energy conservation techniques as well as recommendations for equipment post d/c from acute (going to sx tomorrow) for RW. Pt hopeful for d/c home s/p sx and denies any concerns in regards in overall mobility level at this time.  ? ?Five times Sit to Stand Test (FTSS) ?Method: ?Use a straight back chair with a solid seat that is 16-18? high. Ask participant to sit on the chair with arms folded across their chest.   ?Instructions: ??Stand up and sit down as quickly as possible 5 times, keeping your arms folded across your chest.?   ?Measurement: ?Stop timing when the participant stands the 5th time. ? ?TIME: ___13___ (in seconds) ? ?Times > 13.6 seconds is associated with increased disability and  morbidity (Guralnik, 2000) ?Times > 15 seconds is predictive of recurrent falls in healthy individuals aged 48 and older (Buatois, et al., 2008) ?Normal performance values in community dwelling individuals aged 41 and older (Bohannon, 2006): ?60-69 years: 11.4 seconds ?70-79 years: 12.6 seconds ?80-89 years: 14.8 seconds ? ?MCID: ? 2.3 seconds for Vestibular Disorders (Meretta, 2006) ? ? ? ?6 Min Walk Test:  ?Instructed patient to ambulate as quickly and as safely as possible for 6 minutes using LRAD (no AD during this trial). Patient was allowed to take standing rest breaks without stopping the test, but if the patient required a sitting rest break the clock would be stopped and the test would be over.  ?Results: 675 feet using no AD with supervision overall  with occasional CGA due to mild LOBs for long distances. Results indicate that the patient does not have reduced endurance with ambulation compared to age matched norms.  ? ? ?Age Matched Norms: 7-69 yo M: 1 F: 22, 23-79 yo M: 60 F: 471, 57-89 yo M: 417 F: 392 ?MDC: 58.21 meters (190.98 feet) or 50 meters ?(ANPTA Core Set of Outcome Measures for Adults with Neurologic Conditions, 2018) ? ?Therapy Documentation ?Precautions:  ?Precautions ?Precautions: None ?Restrictions ?Weight Bearing Restrictions: No ? ? ?Pain: ?Pain Assessment ?Pain Scale: 0-10 ?Pain Score: 0-No pain ?Mobility: ?Bed Mobility ?Bed Mobility: Rolling Left;Supine to Sit;Sit to Supine ?Rolling Left: Independent ?Supine to Sit: Independent ?Sit to Supine: Independent ?Transfers ?Transfers: Sit to Stand;Stand to [employer] Transfers ?Sit to Stand: Independent ?Stand to Sit: Independent ?Stand Pivot Transfers: Independent ?Transfer (Assistive device):  (none or does use  RW pending fatigue) ?Locomotion : ?Gait ?Ambulation: Yes ?Gait Assistance: Independent with assistive device;Supervision/Verbal cueing ?Gait Distance (Feet): 800 Feet ?Assistive device: Rolling walker ?Gait Assistance  Details: pt can be mod I with RW; focused in therapy on no device to increase balance challenge at supervision level ?Gait ?Gait: Yes ?Gait Pattern: Scissoring ?Stairs / Additional Locomotion ?Stairs: Yes ?Stairs Assistance: Supervision/Verbal cueing;Independent with assistive device ?Stair Management Technique: Two rails ?Number of Stairs: 12 ?Ramp: Supervision/Verbal cueing ?Curb: Independent with assistive device  ?Trunk/Postural Assessment : ?Cervical Assessment ?Cervical Assessment: Within Functional Limits ?Thoracic Assessment ?Thoracic Assessment: Within Functional Limits ?Lumbar Assessment ?Lumbar Assessment: Within Functional Limits ?Postural Control ?Postural Control: Deficits on evaluation ?Protective Responses: slightly delayed, espcially during gait in busy environment/open space  ?Balance: ?Balance ?Balance Assessed: Yes ?Dynamic Sitting Balance ?Dynamic Sitting - Level of Assistance: 6: Modified independent (Device/Increase time) ?Dynamic Standing Balance ?Dynamic Standing - Level of Assistance: 6: Modified independent (Device/Increase time) ? ? ? ? ?Therapy/Group: Individual Therapy ? ?Allayne Gitelman ?Lars Masson, PT, DPT, CBIS ? ?01/10/2022, 9:42 AM  ?

## 2022-01-10 NOTE — Progress Notes (Signed)
?                                                       PROGRESS NOTE ? ? ?Subjective/Complaints: ? ?No new complaints. No issues with radiation yesterday. In good spirits albeit a little anxious about surgery tomorrow ? ?ROS: Patient denies fever, rash, sore throat, blurred vision, dizziness, nausea, vomiting, diarrhea, cough, shortness of breath or chest pain, joint or back/neck pain, headache, or mood change.  ? ?Objective: ?  ?No results found. ?No results for input(s): WBC, HGB, HCT, PLT in the last 72 hours. ? ?No results for input(s): NA, K, CL, CO2, GLUCOSE, BUN, CREATININE, CALCIUM in the last 72 hours. ? ? ?Intake/Output Summary (Last 24 hours) at 01/10/2022 0928 ?Last data filed at 01/10/2022 0700 ?Gross per 24 hour  ?Intake 474 ml  ?Output --  ?Net 474 ml  ?  ? ?  ? ?Physical Exam: ?Vital Signs ?Blood pressure (!) 109/53, pulse (!) 58, temperature 98.2 ?F (36.8 ?C), temperature source Oral, resp. rate 15, height 5' 2.5" (1.588 m), weight 51.6 kg, SpO2 98 %. ? ? ? ?Constitutional: No distress . Vital signs reviewed. ?HEENT: NCAT, EOMI, oral membranes moist ?Neck: supple ?Cardiovascular: RRR without murmur. No JVD    ?Respiratory/Chest: CTA Bilaterally without wheezes or rales. Normal effort    ?GI/Abdomen: BS +, non-tender, non-distended ?Ext: no clubbing, cyanosis, or edema ?Psych: pleasant and cooperative  ?Musculoskeletal:  ?   Cervical back: Neck supple. No tenderness.  ?   ?Skin: ?   General: Skin is warm and dry.  ?   Comments:IV still in place. No signs of breakdown  ?Neurological:  ?   Mental Status: She is alert and oriented to person, place, and time.  ?   Comments:Alert and oriented x 3. Normal insight and awareness. Intact Memory. Normal language and speech. Cranial nerve exam unremarkable   ?  Moves all 4+ to 5/5. No focal sensory findings. No limb ataxia  ? ? ?Assessment/Plan: ?1. Functional deficits which require 3+ hours per day of interdisciplinary therapy in a comprehensive inpatient  rehab setting. ?Physiatrist is providing close team supervision and 24 hour management of active medical problems listed below. ?Physiatrist and rehab team continue to assess barriers to discharge/monitor patient progress toward functional and medical goals ? ?Care Tool: ? ?Bathing ?   ?Body parts bathed by patient: Right arm, Right lower leg, Left arm, Left lower leg, Chest, Abdomen, Face, Front perineal area, Buttocks, Right upper leg, Left upper leg  ?   ?  ?  ?Bathing assist Assist Level: Set up assist ?  ?  ?Upper Body Dressing/Undressing ?Upper body dressing   ?What is the patient wearing?: Pull over shirt ?   ?Upper body assist Assist Level: Independent ?   ?Lower Body Dressing/Undressing ?Lower body dressing ? ? ? Lower body dressing activity did not occur:  (Pt did her oown lower body dressings) ?What is the patient wearing?: Underwear/pull up, Pants ? ?  ? ?Lower body assist Assist for lower body dressing: Independent ?   ? ?Toileting ?Toileting    ?Toileting assist Assist for toileting: Independent ?  ?  ?Transfers ?Chair/bed transfer ? ?Transfers assist ?   ? ?Chair/bed transfer assist level: Independent ?  ?  ?Locomotion ?Ambulation ? ? ?Ambulation assist ? ?   ? ?  Assist level: Minimal Assistance - Patient > 75% ?Assistive device: No Device ?Max distance: 150'  ? ?Walk 10 feet activity ? ? ?Assist ?   ? ?Assist level: Contact Guard/Touching assist ?Assistive device: Walker-rolling  ? ?Walk 50 feet activity ? ? ?Assist   ? ?Assist level: Contact Guard/Touching assist ?Assistive device: Walker-rolling  ? ? ?Walk 150 feet activity ? ? ?Assist Walk 150 feet activity did not occur: Safety/medical concerns ? ?  ?  ?  ? ?Walk 10 feet on uneven surface  ?activity ? ? ?Assist   ? ? ?Assist level: Contact Guard/Touching assist ?Assistive device: Walker-rolling  ? ?Wheelchair ? ? ? ? ?Assist Is the patient using a wheelchair?: No ?  ?  ? ?  ?   ? ? ?Wheelchair 50 feet with 2 turns activity ? ? ? ?Assist ? ?  ?   ? ? ?   ? ?Wheelchair 150 feet activity  ? ? ? ?Assist ?   ? ? ?   ? ?Blood pressure (!) 109/53, pulse (!) 58, temperature 98.2 ?F (36.8 ?C), temperature source Oral, resp. rate 15, height 5' 2.5" (1.588 m), weight 51.6 kg, SpO2 98 %. ? ?Medical Problem List and Plan: ?1. 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            -patient may  shower ?            -ELOS/Goals: 4/28---for craniotomy/resection, mod I to supervision ? -Continue CIR therapies including PT, OT  ?2.  Antithrombotics: ?-DVT/anticoagulation:  Mechanical: Sequential compression devices, below knee Bilateral lower extremities ?            -antiplatelet therapy: N/A ?3. Pain Management: pain well controlled ?4. Mood: LCSW to follow for evaluation and support.  ?            -antipsychotic agents: N/a ?5. Neuropsych: This patient may be intermittently capable of making decisions on her own behalf. ?6. Skin/Wound Care: Routine pressure relief measuress.  ?7. Fluids/Electrolytes/Nutrition: Monitor I/O. Encourage PO  ?8. 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            --on decadron 4 mg every 6 hours for midline shift/cerebral edema.  ? - continue Decadron per rad onc IV form until surgery ?9. 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 Constipation: Will start  miralax daily ?  moving bowels now,   ?11. Leucocytosis: No signs of fevers or infection.  ?  Most recently 14k, likely steroid effect ? ?  ? ? ? ?LOS: ?7 days ?A FACE TO FACE EVALUATION WAS PERFORMED ? ?Meredith Staggers ?01/10/2022, 9:28 AM  ? ? ? ?

## 2022-01-11 ENCOUNTER — Encounter (HOSPITAL_COMMUNITY)
Admission: RE | Disposition: A | Discharge status: Other Acute Inpatient Hospital | Payer: Self-pay | Source: Intra-hospital | Attending: Physical Medicine & Rehabilitation

## 2022-01-11 ENCOUNTER — Inpatient Hospital Stay (HOSPITAL_COMMUNITY): Payer: Medicare Other | Admitting: Certified Registered Nurse Anesthetist

## 2022-01-11 ENCOUNTER — Inpatient Hospital Stay (HOSPITAL_COMMUNITY)
Admission: RE | Admit: 2022-01-11 | Discharge: 2022-01-13 | DRG: 027 | Disposition: A | Discharge status: Home Health | Payer: Medicare Other | Attending: Neurosurgery | Admitting: Neurosurgery

## 2022-01-11 ENCOUNTER — Other Ambulatory Visit: Payer: Self-pay

## 2022-01-11 ENCOUNTER — Inpatient Hospital Stay (HOSPITAL_COMMUNITY)
Admission: RE | Disposition: A | Discharge status: Home Health | Payer: Self-pay | Source: Home / Self Care | Attending: Neurological Surgery

## 2022-01-11 ENCOUNTER — Encounter (HOSPITAL_COMMUNITY): Payer: Self-pay | Admitting: Physical Medicine & Rehabilitation

## 2022-01-11 ENCOUNTER — Encounter (HOSPITAL_COMMUNITY): Payer: Self-pay | Admitting: Neurological Surgery

## 2022-01-11 DIAGNOSIS — G936 Cerebral edema: Secondary | ICD-10-CM | POA: Diagnosis present

## 2022-01-11 DIAGNOSIS — Z85118 Personal history of other malignant neoplasm of bronchus and lung: Secondary | ICD-10-CM

## 2022-01-11 DIAGNOSIS — J449 Chronic obstructive pulmonary disease, unspecified: Secondary | ICD-10-CM

## 2022-01-11 DIAGNOSIS — G9389 Other specified disorders of brain: Secondary | ICD-10-CM | POA: Diagnosis not present

## 2022-01-11 DIAGNOSIS — C3411 Malignant neoplasm of upper lobe, right bronchus or lung: Secondary | ICD-10-CM

## 2022-01-11 DIAGNOSIS — C3490 Malignant neoplasm of unspecified part of unspecified bronchus or lung: Secondary | ICD-10-CM

## 2022-01-11 DIAGNOSIS — Z87891 Personal history of nicotine dependence: Secondary | ICD-10-CM

## 2022-01-11 DIAGNOSIS — G939 Disorder of brain, unspecified: Secondary | ICD-10-CM | POA: Diagnosis present

## 2022-01-11 DIAGNOSIS — D496 Neoplasm of unspecified behavior of brain: Secondary | ICD-10-CM

## 2022-01-11 DIAGNOSIS — E039 Hypothyroidism, unspecified: Secondary | ICD-10-CM | POA: Diagnosis not present

## 2022-01-11 DIAGNOSIS — D72829 Elevated white blood cell count, unspecified: Secondary | ICD-10-CM

## 2022-01-11 DIAGNOSIS — Z818 Family history of other mental and behavioral disorders: Secondary | ICD-10-CM

## 2022-01-11 DIAGNOSIS — K5900 Constipation, unspecified: Secondary | ICD-10-CM

## 2022-01-11 DIAGNOSIS — Z923 Personal history of irradiation: Secondary | ICD-10-CM

## 2022-01-11 DIAGNOSIS — Z7951 Long term (current) use of inhaled steroids: Secondary | ICD-10-CM | POA: Diagnosis not present

## 2022-01-11 DIAGNOSIS — Z79899 Other long term (current) drug therapy: Secondary | ICD-10-CM | POA: Diagnosis not present

## 2022-01-11 DIAGNOSIS — Z9889 Other specified postprocedural states: Principal | ICD-10-CM

## 2022-01-11 DIAGNOSIS — Z79890 Hormone replacement therapy (postmenopausal): Secondary | ICD-10-CM

## 2022-01-11 DIAGNOSIS — Z800 Family history of malignant neoplasm of digestive organs: Secondary | ICD-10-CM | POA: Diagnosis not present

## 2022-01-11 DIAGNOSIS — K5901 Slow transit constipation: Secondary | ICD-10-CM | POA: Diagnosis not present

## 2022-01-11 DIAGNOSIS — C7931 Secondary malignant neoplasm of brain: Secondary | ICD-10-CM | POA: Diagnosis present

## 2022-01-11 DIAGNOSIS — W19XXXS Unspecified fall, sequela: Secondary | ICD-10-CM

## 2022-01-11 HISTORY — PX: APPLICATION OF CRANIAL NAVIGATION: SHX6578

## 2022-01-11 HISTORY — PX: CRANIOTOMY: SHX93

## 2022-01-11 LAB — PREPARE RBC (CROSSMATCH)

## 2022-01-11 LAB — SURGICAL PCR SCREEN
MRSA, PCR: NEGATIVE
Staphylococcus aureus: NEGATIVE

## 2022-01-11 SURGERY — CRANIOTOMY TUMOR EXCISION
Anesthesia: General | Site: Head | Laterality: Right

## 2022-01-11 SURGERY — CRANIOTOMY TUMOR EXCISION
Anesthesia: General | Laterality: Right

## 2022-01-11 MED ORDER — LEVOTHYROXINE SODIUM 75 MCG PO TABS
75.0000 ug | ORAL_TABLET | Freq: Every day | ORAL | Status: DC
  Administered 2022-01-12 – 2022-01-13 (×2): 75 ug via ORAL
  Filled 2022-01-11 (×2): qty 1

## 2022-01-11 MED ORDER — THROMBIN 5000 UNITS EX SOLR
CUTANEOUS | Status: AC
  Filled 2022-01-11: qty 5000

## 2022-01-11 MED ORDER — PROPOFOL 10 MG/ML IV BOLUS
INTRAVENOUS | Status: AC
  Filled 2022-01-11: qty 20

## 2022-01-11 MED ORDER — DEXAMETHASONE SODIUM PHOSPHATE 4 MG/ML IJ SOLN: 4.0000 mg | Freq: Three times a day (TID) | INTRAMUSCULAR | Status: DC

## 2022-01-11 MED ORDER — THROMBIN 5000 UNITS EX SOLR: OROMUCOSAL | Status: DC | PRN

## 2022-01-11 MED ORDER — LIDOCAINE 2% (20 MG/ML) 5 ML SYRINGE
INTRAMUSCULAR | Status: AC
Start: 2022-01-11 — End: ?
  Filled 2022-01-11: qty 5

## 2022-01-11 MED ORDER — HYDROMORPHONE HCL 1 MG/ML IJ SOLN
0.2500 mg | INTRAMUSCULAR | Status: DC | PRN
  Administered 2022-01-11 (×2): 0.25 mg via INTRAVENOUS

## 2022-01-11 MED ORDER — CEFAZOLIN SODIUM-DEXTROSE 2-3 GM-%(50ML) IV SOLR
INTRAVENOUS | Status: DC | PRN
  Administered 2022-01-11: 2 g via INTRAVENOUS

## 2022-01-11 MED ORDER — ONDANSETRON HCL 4 MG/2ML IJ SOLN
INTRAMUSCULAR | Status: DC | PRN
  Administered 2022-01-11: 4 mg via INTRAVENOUS

## 2022-01-11 MED ORDER — ADULT MULTIVITAMIN W/MINERALS CH
1.0000 | ORAL_TABLET | Freq: Every day | ORAL | Status: DC
  Administered 2022-01-12 – 2022-01-13 (×2): 1 via ORAL
  Filled 2022-01-11 (×2): qty 1

## 2022-01-11 MED ORDER — ACETAMINOPHEN 650 MG RE SUPP: 650.0000 mg | RECTAL | Status: DC | PRN

## 2022-01-11 MED ORDER — CHLORHEXIDINE GLUCONATE 0.12 % MT SOLN
15.0000 mL | Freq: Once | OROMUCOSAL | Status: AC
  Administered 2022-01-11: 15 mL via OROMUCOSAL

## 2022-01-11 MED ORDER — CALCIUM CARBONATE 1250 (500 CA) MG PO TABS
500.0000 mg | ORAL_TABLET | Freq: Two times a day (BID) | ORAL | Status: DC
  Administered 2022-01-12 – 2022-01-13 (×3): 500 mg via ORAL
  Filled 2022-01-11 (×5): qty 1

## 2022-01-11 MED ORDER — ROCURONIUM BROMIDE 10 MG/ML (PF) SYRINGE
PREFILLED_SYRINGE | INTRAVENOUS | Status: AC
  Filled 2022-01-11: qty 10

## 2022-01-11 MED ORDER — MOMETASONE FURO-FORMOTEROL FUM 200-5 MCG/ACT IN AERO
2.0000 | INHALATION_SPRAY | Freq: Two times a day (BID) | RESPIRATORY_TRACT | Status: DC
  Administered 2022-01-11 – 2022-01-13 (×4): 2 via RESPIRATORY_TRACT
  Filled 2022-01-11: qty 8.8

## 2022-01-11 MED ORDER — CEFAZOLIN SODIUM-DEXTROSE 1-4 GM/50ML-% IV SOLN
1.0000 g | Freq: Three times a day (TID) | INTRAVENOUS | Status: AC
  Administered 2022-01-11 – 2022-01-12 (×2): 1 g via INTRAVENOUS
  Filled 2022-01-11 (×2): qty 50

## 2022-01-11 MED ORDER — FLUTICASONE PROPIONATE 50 MCG/ACT NA SUSP
2.0000 | Freq: Every day | NASAL | Status: DC
  Administered 2022-01-12 – 2022-01-13 (×2): 2 via NASAL
  Filled 2022-01-11: qty 16

## 2022-01-11 MED ORDER — PROMETHAZINE HCL 25 MG PO TABS: 12.5000 mg | ORAL_TABLET | ORAL | Status: DC | PRN

## 2022-01-11 MED ORDER — ONDANSETRON HCL 4 MG/2ML IJ SOLN: 4.0000 mg | INTRAMUSCULAR | Status: DC | PRN

## 2022-01-11 MED ORDER — SODIUM CHLORIDE 0.9 % IV SOLN
0.1500 ug/kg/min | INTRAVENOUS | Status: DC
  Administered 2022-01-11: .1 ug/kg/min via INTRAVENOUS
  Filled 2022-01-11 (×2): qty 2000

## 2022-01-11 MED ORDER — REMIFENTANIL HCL 1 MG IV SOLR
INTRAVENOUS | Status: DC | PRN
  Administered 2022-01-11: 80 ug via INTRAVENOUS

## 2022-01-11 MED ORDER — SUGAMMADEX SODIUM 200 MG/2ML IV SOLN
INTRAVENOUS | Status: DC | PRN
  Administered 2022-01-11: 200 mg via INTRAVENOUS

## 2022-01-11 MED ORDER — ONDANSETRON HCL 4 MG PO TABS: 4.0000 mg | ORAL_TABLET | ORAL | Status: DC | PRN

## 2022-01-11 MED ORDER — LACTATED RINGERS IV SOLN: INTRAVENOUS | Status: DC | PRN

## 2022-01-11 MED ORDER — HYDROMORPHONE HCL 1 MG/ML IJ SOLN
INTRAMUSCULAR | Status: AC
  Filled 2022-01-11: qty 1

## 2022-01-11 MED ORDER — FENTANYL CITRATE (PF) 250 MCG/5ML IJ SOLN
INTRAMUSCULAR | Status: DC | PRN
  Administered 2022-01-11: 100 ug via INTRAVENOUS

## 2022-01-11 MED ORDER — BACITRACIN ZINC 500 UNIT/GM EX OINT
TOPICAL_OINTMENT | CUTANEOUS | Status: AC
  Filled 2022-01-11: qty 28.35

## 2022-01-11 MED ORDER — PHENYLEPHRINE 80 MCG/ML (10ML) SYRINGE FOR IV PUSH (FOR BLOOD PRESSURE SUPPORT)
PREFILLED_SYRINGE | INTRAVENOUS | Status: DC | PRN
Start: 2022-01-11 — End: 2022-01-11
  Administered 2022-01-11: 40 ug via INTRAVENOUS

## 2022-01-11 MED ORDER — AMISULPRIDE (ANTIEMETIC) 5 MG/2ML IV SOLN: 10.0000 mg | Freq: Once | INTRAVENOUS | Status: DC | PRN

## 2022-01-11 MED ORDER — ONDANSETRON HCL 4 MG/2ML IJ SOLN: 4.0000 mg | Freq: Once | INTRAMUSCULAR | Status: DC | PRN

## 2022-01-11 MED ORDER — ALBUTEROL SULFATE (2.5 MG/3ML) 0.083% IN NEBU: 2.5000 mg | INHALATION_SOLUTION | RESPIRATORY_TRACT | Status: DC | PRN

## 2022-01-11 MED ORDER — FENTANYL CITRATE (PF) 250 MCG/5ML IJ SOLN
INTRAMUSCULAR | Status: AC
Start: 2022-01-11 — End: ?
  Filled 2022-01-11: qty 5

## 2022-01-11 MED ORDER — LIDOCAINE-EPINEPHRINE 1 %-1:100000 IJ SOLN
INTRAMUSCULAR | Status: DC | PRN
  Administered 2022-01-11: 10 mL

## 2022-01-11 MED ORDER — EPHEDRINE 5 MG/ML INJ
INTRAVENOUS | Status: AC
  Filled 2022-01-11: qty 5

## 2022-01-11 MED ORDER — LIDOCAINE 2% (20 MG/ML) 5 ML SYRINGE
INTRAMUSCULAR | Status: DC | PRN
  Administered 2022-01-11: 60 mg via INTRAVENOUS

## 2022-01-11 MED ORDER — DEXAMETHASONE SODIUM PHOSPHATE 10 MG/ML IJ SOLN
6.0000 mg | Freq: Four times a day (QID) | INTRAMUSCULAR | Status: AC
  Administered 2022-01-11 – 2022-01-12 (×4): 6 mg via INTRAVENOUS
  Filled 2022-01-11 (×4): qty 1

## 2022-01-11 MED ORDER — DEXAMETHASONE SODIUM PHOSPHATE 10 MG/ML IJ SOLN
INTRAMUSCULAR | Status: AC
  Filled 2022-01-11: qty 1

## 2022-01-11 MED ORDER — ACETAMINOPHEN 325 MG PO TABS: 650.0000 mg | ORAL_TABLET | ORAL | Status: DC | PRN

## 2022-01-11 MED ORDER — BACITRACIN ZINC 500 UNIT/GM EX OINT
TOPICAL_OINTMENT | CUTANEOUS | Status: DC | PRN
  Administered 2022-01-11: 1 via TOPICAL

## 2022-01-11 MED ORDER — GELATIN ABSORBABLE 100 EX MISC: CUTANEOUS | Status: DC | PRN

## 2022-01-11 MED ORDER — PHENYLEPHRINE 80 MCG/ML (10ML) SYRINGE FOR IV PUSH (FOR BLOOD PRESSURE SUPPORT)
PREFILLED_SYRINGE | INTRAVENOUS | Status: AC
  Filled 2022-01-11: qty 10

## 2022-01-11 MED ORDER — FENTANYL CITRATE (PF) 250 MCG/5ML IJ SOLN
INTRAMUSCULAR | Status: AC
  Filled 2022-01-11: qty 5

## 2022-01-11 MED ORDER — CHLORHEXIDINE GLUCONATE CLOTH 2 % EX PADS
6.0000 | MEDICATED_PAD | Freq: Every day | CUTANEOUS | Status: DC
  Administered 2022-01-12: 6 via TOPICAL

## 2022-01-11 MED ORDER — DEXAMETHASONE SODIUM PHOSPHATE 10 MG/ML IJ SOLN
INTRAMUSCULAR | Status: DC | PRN
  Administered 2022-01-11: 5 mg via INTRAVENOUS

## 2022-01-11 MED ORDER — DEXAMETHASONE SODIUM PHOSPHATE 4 MG/ML IJ SOLN
4.0000 mg | Freq: Four times a day (QID) | INTRAMUSCULAR | Status: AC
  Administered 2022-01-12 – 2022-01-13 (×4): 4 mg via INTRAVENOUS
  Filled 2022-01-11 (×4): qty 1

## 2022-01-11 MED ORDER — POTASSIUM CHLORIDE IN NACL 20-0.9 MEQ/L-% IV SOLN
INTRAVENOUS | Status: DC
  Filled 2022-01-11: qty 1000

## 2022-01-11 MED ORDER — LABETALOL HCL 5 MG/ML IV SOLN: 10.0000 mg | INTRAVENOUS | Status: DC | PRN

## 2022-01-11 MED ORDER — HYDROCODONE-ACETAMINOPHEN 5-325 MG PO TABS
1.0000 | ORAL_TABLET | ORAL | Status: DC | PRN
  Administered 2022-01-11 – 2022-01-12 (×3): 1 via ORAL
  Filled 2022-01-11 (×3): qty 1

## 2022-01-11 MED ORDER — LEVETIRACETAM IN NACL 500 MG/100ML IV SOLN
500.0000 mg | Freq: Two times a day (BID) | INTRAVENOUS | Status: DC
  Administered 2022-01-11 – 2022-01-13 (×4): 500 mg via INTRAVENOUS
  Filled 2022-01-11 (×4): qty 100

## 2022-01-11 MED ORDER — THROMBIN 20000 UNITS EX KIT
PACK | CUTANEOUS | Status: AC
  Filled 2022-01-11: qty 1

## 2022-01-11 MED ORDER — ESMOLOL HCL 100 MG/10ML IV SOLN
INTRAVENOUS | Status: AC
  Filled 2022-01-11: qty 20

## 2022-01-11 MED ORDER — ROCURONIUM BROMIDE 10 MG/ML (PF) SYRINGE
PREFILLED_SYRINGE | INTRAVENOUS | Status: DC | PRN
  Administered 2022-01-11: 60 mg via INTRAVENOUS
  Administered 2022-01-11: 20 mg via INTRAVENOUS

## 2022-01-11 MED ORDER — CHLORHEXIDINE GLUCONATE 0.12 % MT SOLN
OROMUCOSAL | Status: AC
  Filled 2022-01-11: qty 15

## 2022-01-11 MED ORDER — SODIUM CHLORIDE 0.9 % IV SOLN: INTRAVENOUS | Status: DC

## 2022-01-11 MED ORDER — ORAL CARE MOUTH RINSE: 15.0000 mL | Freq: Once | OROMUCOSAL | Status: AC

## 2022-01-11 MED ORDER — MORPHINE SULFATE (PF) 2 MG/ML IV SOLN: 1.0000 mg | INTRAVENOUS | Status: DC | PRN

## 2022-01-11 MED ORDER — LIDOCAINE-EPINEPHRINE 1 %-1:100000 IJ SOLN
INTRAMUSCULAR | Status: AC
  Filled 2022-01-11: qty 1

## 2022-01-11 MED ORDER — SODIUM CHLORIDE 0.9% IV SOLUTION: Freq: Once | INTRAVENOUS | Status: DC

## 2022-01-11 MED ORDER — PROPOFOL 10 MG/ML IV BOLUS
INTRAVENOUS | Status: DC | PRN
  Administered 2022-01-11: 100 mg via INTRAVENOUS
  Administered 2022-01-11: 30 mg via INTRAVENOUS

## 2022-01-11 MED ORDER — 0.9 % SODIUM CHLORIDE (POUR BTL) OPTIME
TOPICAL | Status: DC | PRN
  Administered 2022-01-11 (×3): 1000 mL

## 2022-01-11 MED ORDER — SODIUM CHLORIDE 0.9 % IV SOLN: INTRAVENOUS | Status: DC | PRN

## 2022-01-11 MED ORDER — PHENYLEPHRINE HCL-NACL 20-0.9 MG/250ML-% IV SOLN
INTRAVENOUS | Status: DC | PRN
  Administered 2022-01-11: 15 ug/min via INTRAVENOUS
  Administered 2022-01-11: 50 ug/min via INTRAVENOUS

## 2022-01-11 MED ORDER — PANTOPRAZOLE SODIUM 40 MG IV SOLR
40.0000 mg | Freq: Every day | INTRAVENOUS | Status: DC
  Administered 2022-01-11 – 2022-01-12 (×2): 40 mg via INTRAVENOUS
  Filled 2022-01-11 (×2): qty 10

## 2022-01-11 MED ORDER — HEMOSTATIC AGENTS (NO CHARGE) OPTIME
TOPICAL | Status: DC | PRN
  Administered 2022-01-11: 1 via TOPICAL

## 2022-01-11 MED ORDER — ONDANSETRON HCL 4 MG/2ML IJ SOLN
INTRAMUSCULAR | Status: AC
Start: 2022-01-11 — End: ?
  Filled 2022-01-11: qty 2

## 2022-01-11 MED ORDER — SENNA 8.6 MG PO TABS
1.0000 | ORAL_TABLET | Freq: Two times a day (BID) | ORAL | Status: DC
  Administered 2022-01-11 – 2022-01-13 (×4): 8.6 mg via ORAL
  Filled 2022-01-11 (×4): qty 1

## 2022-01-11 MED ORDER — VITAMIN D 25 MCG (1000 UNIT) PO TABS
2000.0000 [IU] | ORAL_TABLET | Freq: Every day | ORAL | Status: DC
  Administered 2022-01-12 – 2022-01-13 (×2): 2000 [IU] via ORAL
  Filled 2022-01-11 (×2): qty 2

## 2022-01-11 MED ORDER — SUCCINYLCHOLINE CHLORIDE 200 MG/10ML IV SOSY
PREFILLED_SYRINGE | INTRAVENOUS | Status: AC
  Filled 2022-01-11: qty 10

## 2022-01-11 SURGICAL SUPPLY — 63 items
BAG COUNTER SPONGE SURGICOUNT (BAG) ×3 IMPLANT
BAG SPNG CNTER NS LX DISP (BAG) ×2
BAND INSRT 18 STRL LF DISP RB (MISCELLANEOUS) ×4
BAND RUBBER #18 3X1/16 STRL (MISCELLANEOUS) ×2 IMPLANT
BLADE CLIPPER SPEC (BLADE) ×3 IMPLANT
BUR SPIRAL ROUTER 2.3 (BUR) ×1 IMPLANT
CANISTER SUCT 3000ML PPV (MISCELLANEOUS) ×3 IMPLANT
CLIP VESOCCLUDE MED 6/CT (CLIP) IMPLANT
CNTNR URN SCR LID CUP LEK RST (MISCELLANEOUS) ×2 IMPLANT
CONT SPEC 4OZ STRL OR WHT (MISCELLANEOUS) ×3
COVER BACK TABLE 60X90IN (DRAPES) IMPLANT
DRAPE MICROSCOPE LEICA (MISCELLANEOUS) ×1 IMPLANT
DRAPE NEUROLOGICAL W/INCISE (DRAPES) ×3 IMPLANT
DRAPE SURG 17X23 STRL (DRAPES) IMPLANT
DRAPE WARM FLUID 44X44 (DRAPES) ×3 IMPLANT
DURAPREP 6ML APPLICATOR 50/CS (WOUND CARE) ×3 IMPLANT
ELECT REM PT RETURN 9FT ADLT (ELECTROSURGICAL) ×3
ELECTRODE REM PT RTRN 9FT ADLT (ELECTROSURGICAL) ×2 IMPLANT
EVACUATOR 1/8 PVC DRAIN (DRAIN) IMPLANT
GAUZE 4X4 16PLY ~~LOC~~+RFID DBL (SPONGE) IMPLANT
GAUZE SPONGE 4X4 12PLY STRL (GAUZE/BANDAGES/DRESSINGS) ×1 IMPLANT
GLOVE BIO SURGEON STRL SZ7 (GLOVE) ×1 IMPLANT
GLOVE BIO SURGEON STRL SZ8 (GLOVE) ×3 IMPLANT
GLOVE BIOGEL PI IND STRL 7.0 (GLOVE) IMPLANT
GLOVE BIOGEL PI INDICATOR 7.0 (GLOVE) ×1
GOWN STRL REUS W/ TWL LRG LVL3 (GOWN DISPOSABLE) IMPLANT
GOWN STRL REUS W/ TWL XL LVL3 (GOWN DISPOSABLE) IMPLANT
GOWN STRL REUS W/TWL 2XL LVL3 (GOWN DISPOSABLE) ×2 IMPLANT
GOWN STRL REUS W/TWL LRG LVL3 (GOWN DISPOSABLE) ×9
GOWN STRL REUS W/TWL XL LVL3 (GOWN DISPOSABLE) ×6
HEMOSTAT POWDER KIT SURGIFOAM (HEMOSTASIS) ×1 IMPLANT
HEMOSTAT SURGICEL 2X14 (HEMOSTASIS) ×1 IMPLANT
KIT BASIN OR (CUSTOM PROCEDURE TRAY) ×3 IMPLANT
KIT TURNOVER KIT B (KITS) ×3 IMPLANT
MARKER SPHERE PSV REFLC 13MM (MARKER) ×6 IMPLANT
NEEDLE HYPO 22GX1.5 SAFETY (NEEDLE) ×3 IMPLANT
NS IRRIG 1000ML POUR BTL (IV SOLUTION) ×5 IMPLANT
PACK CRANIOTOMY CUSTOM (CUSTOM PROCEDURE TRAY) ×3 IMPLANT
PAD ARMBOARD 7.5X6 YLW CONV (MISCELLANEOUS) ×5 IMPLANT
PATTIES SURGICAL .25X.25 (GAUZE/BANDAGES/DRESSINGS) IMPLANT
PATTIES SURGICAL .5 X.5 (GAUZE/BANDAGES/DRESSINGS) IMPLANT
PATTIES SURGICAL .5 X3 (DISPOSABLE) ×1 IMPLANT
PATTIES SURGICAL 1X1 (DISPOSABLE) IMPLANT
PERFORATOR LRG  14-11MM (BIT) ×3
PERFORATOR LRG 14-11MM (BIT) ×2 IMPLANT
PIN MAYFIELD SKULL DISP (PIN) ×1 IMPLANT
PLATE CRANIAL SHUNT 14 (Plate) ×2 IMPLANT
PLATE DOUBLE Y CMF 6H (Plate) ×1 IMPLANT
SCREW UNIII AXS SD 1.5X4 (Screw) ×13 IMPLANT
SPECIMEN JAR SMALL (MISCELLANEOUS) IMPLANT
SPONGE NEURO XRAY DETECT 1X3 (DISPOSABLE) IMPLANT
SPONGE SURGIFOAM ABS GEL 100 (HEMOSTASIS) ×3 IMPLANT
STAPLER VISISTAT 35W (STAPLE) ×1 IMPLANT
SUT ETHILON 3 0 FSL (SUTURE) IMPLANT
SUT NURALON 4 0 TR CR/8 (SUTURE) ×6 IMPLANT
SUT VIC AB 2-0 CP2 18 (SUTURE) ×4 IMPLANT
SYR CONTROL 10ML LL (SYRINGE) ×2 IMPLANT
TAPE CLOTH SURG 4X10 WHT LF (GAUZE/BANDAGES/DRESSINGS) ×1 IMPLANT
TOWEL GREEN STERILE (TOWEL DISPOSABLE) ×3 IMPLANT
TOWEL GREEN STERILE FF (TOWEL DISPOSABLE) ×3 IMPLANT
TRAY FOLEY MTR SLVR 16FR STAT (SET/KITS/TRAYS/PACK) ×1 IMPLANT
UNDERPAD 30X36 HEAVY ABSORB (UNDERPADS AND DIAPERS) IMPLANT
WATER STERILE IRR 1000ML POUR (IV SOLUTION) ×3 IMPLANT

## 2022-01-11 NOTE — Progress Notes (Signed)
?                                                       PROGRESS NOTE ? ? ?Subjective/Complaints: ? ?Pt without complaints. A little nervous about surgery. Hungry more than anything else this morning! ? ?ROS: Patient denies fever, rash, sore throat, blurred vision, dizziness, nausea, vomiting, diarrhea, cough, shortness of breath or chest pain, joint or back/neck pain, headache, or mood change.  ? ?Objective: ?  ?No results found. ?No results for input(s): WBC, HGB, HCT, PLT in the last 72 hours. ? ?No results for input(s): NA, K, CL, CO2, GLUCOSE, BUN, CREATININE, CALCIUM in the last 72 hours. ? ? ?Intake/Output Summary (Last 24 hours) at 01/11/2022 0918 ?Last data filed at 01/10/2022 1700 ?Gross per 24 hour  ?Intake 480 ml  ?Output --  ?Net 480 ml  ?  ? ?  ? ?Physical Exam: ?Vital Signs ?Blood pressure 103/62, pulse (!) 53, temperature 99.2 ?F (37.3 ?C), temperature source Oral, resp. rate 16, height 5' 2.5" (1.588 m), weight 51.6 kg, SpO2 97 %. ? ? ? ?Constitutional: No distress . Vital signs reviewed. ?HEENT: NCAT, EOMI, oral membranes moist ?Neck: supple ?Cardiovascular: RRR without murmur. No JVD    ?Respiratory/Chest: CTA Bilaterally without wheezes or rales. Normal effort    ?GI/Abdomen: BS +, non-tender, non-distended ?Ext: no clubbing, cyanosis, or edema ?Psych: pleasant and cooperative  ?Musculoskeletal:  ?   Cervical back: Neck supple. No tenderness.  ?   ?Skin: ?   General: Skin is warm and dry.  ?   Comments:IV still in place. No signs of breakdown  ?Neurological:  ?   Alert and oriented x 3. Normal insight and awareness. Intact Memory. Normal language and speech. Cranial nerve exam unremarkable   ?  Moves all 4+ to 5/5. No focal sensory findings. No limb ataxia  ? ? ?Assessment/Plan: ?1. Functional deficits which require 3+ hours per day of interdisciplinary therapy in a comprehensive inpatient rehab setting. ?Physiatrist is providing close team supervision and 24 hour management of active medical  problems listed below. ?Physiatrist and rehab team continue to assess barriers to discharge/monitor patient progress toward functional and medical goals ? ?Care Tool: ? ?Bathing ?   ?Body parts bathed by patient: Right arm, Right lower leg, Left arm, Left lower leg, Chest, Abdomen, Face, Front perineal area, Buttocks, Right upper leg, Left upper leg  ?   ?  ?  ?Bathing assist Assist Level: Independent with assistive device ?  ?  ?Upper Body Dressing/Undressing ?Upper body dressing   ?What is the patient wearing?: Pull over shirt, Bra ?   ?Upper body assist Assist Level: Independent ?   ?Lower Body Dressing/Undressing ?Lower body dressing ? ? ? Lower body dressing activity did not occur:  (Pt did her oown lower body dressings) ?What is the patient wearing?: Underwear/pull up, Pants ? ?  ? ?Lower body assist Assist for lower body dressing: Independent ?   ? ?Toileting ?Toileting    ?Toileting assist Assist for toileting: Independent ?  ?  ?Transfers ?Chair/bed transfer ? ?Transfers assist ?   ? ?Chair/bed transfer assist level: Independent ?  ?  ?Locomotion ?Ambulation ? ? ?Ambulation assist ? ?   ? ?Assist level: Independent with assistive device ?Assistive device: Walker-rolling ?Max distance: 600'  ? ?Walk 10 feet activity ? ? ?  Assist ?   ? ?Assist level: Independent with assistive device ?Assistive device: Walker-rolling  ? ?Walk 50 feet activity ? ? ?Assist   ? ?Assist level: Independent with assistive device ?Assistive device: Walker-rolling  ? ? ?Walk 150 feet activity ? ? ?Assist Walk 150 feet activity did not occur: Safety/medical concerns ? ?Assist level: Independent with assistive device ?Assistive device: Walker-rolling ?  ? ?Walk 10 feet on uneven surface  ?activity ? ? ?Assist   ? ? ?Assist level: Supervision/Verbal cueing ?Assistive device: Walker-rolling  ? ?Wheelchair ? ? ? ? ?Assist Is the patient using a wheelchair?: No ?  ?  ? ?  ?   ? ? ?Wheelchair 50 feet with 2 turns activity ? ? ? ?Assist ? ?   ?  ? ? ?   ? ?Wheelchair 150 feet activity  ? ? ? ?Assist ?   ? ? ?   ? ?Blood pressure 103/62, pulse (!) 53, temperature 99.2 ?F (37.3 ?C), temperature source Oral, resp. rate 16, height 5' 2.5" (1.588 m), weight 51.6 kg, SpO2 97 %. ? ?Medical Problem List and Plan: ?1. 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            -patient may  shower ?            - -for craniotomy/resection today. Will follow along post-op to see if she has any further inpatient rehab needs ?   ?2.  Antithrombotics: ?-DVT/anticoagulation:  Mechanical: Sequential compression devices, below knee Bilateral lower extremities ?            -antiplatelet therapy: N/A ?3. Pain Management: pain well controlled ?4. Mood: LCSW to follow for evaluation and support.  ?            -antipsychotic agents: N/a ?5. Neuropsych: This patient may be intermittently capable of making decisions on her own behalf. ?6. Skin/Wound Care: Routine pressure relief measuress.  ?7. Fluids/Electrolytes/Nutrition: Monitor I/O. Encourage PO  ?8. 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            --on decadron 4 mg every 6 hours IV ?9. 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 Constipation: Will start  miralax daily ?  moving bowels now,   ?11. Leucocytosis: No signs of fevers or infection.  ?  Most recently 14k, likely steroid effect ? ?  ? ? ? ?LOS: ?8 days ?A FACE TO FACE EVALUATION WAS PERFORMED ? ?Meredith Staggers ?01/11/2022, 9:18 AM  ? ? ? ?

## 2022-01-11 NOTE — Transfer of Care (Signed)
Immediate Anesthesia Transfer of Care Note ? ?Patient: [name] ? ?Procedure(s) Performed: RIGHT CRANIOTOMY FOR TUMOR RESECTION WITH BRAIN LAB (Right: Head) ?APPLICATION OF CRANIAL NAVIGATION (Right) ? ?Patient Location: PACU ? ?Anesthesia Type:General ? ?Level of Consciousness: awake, alert  and oriented ? ?Airway & Oxygen Therapy: Patient Spontanous Breathing ? ?Post-op Assessment: Report given to RN and Post -op Vital signs reviewed and stable ? ?Post vital signs: Reviewed and stable ? ?Last Vitals:  ?Vitals Value Taken Time  ?BP 203/154 01/11/22 1658  ?Temp    ?Pulse 58 01/11/22 1703  ?Resp 15 01/11/22 1703  ?SpO2 98 % 01/11/22 1703  ?Vitals shown include unvalidated device data. ? ?Last Pain:  ?Vitals:  ? 01/11/22 1248  ?TempSrc: Oral  ?PainSc: 0-No pain  ?   ? ?  ? ?Complications: No notable events documented. ?

## 2022-01-11 NOTE — Anesthesia Postprocedure Evaluation (Signed)
Anesthesia Post Note ? ?Patient: [name] ? ?Procedure(s) Performed: RIGHT CRANIOTOMY FOR TUMOR RESECTION WITH BRAIN LAB (Right: Head) ?APPLICATION OF CRANIAL NAVIGATION (Right) ? ?  ? ?Patient location during evaluation: PACU ?Anesthesia Type: General ?Level of consciousness: awake and alert and oriented ?Pain management: pain level controlled ?Vital Signs Assessment: post-procedure vital signs reviewed and stable ?Respiratory status: spontaneous breathing, nonlabored ventilation, respiratory function stable and patient connected to nasal cannula oxygen ?Cardiovascular status: blood pressure returned to baseline and stable ?Postop Assessment: no apparent nausea or vomiting ?Anesthetic complications: no ? ? ?No notable events documented. ? ?Last Vitals:  ?Vitals:  ? 01/11/22 1715 01/11/22 1730  ?BP: (!) 130/56 107/63  ?Pulse: (!) 55 (!) 59  ?Resp: 14 14  ?Temp:    ?SpO2: 99% 96%  ?  ?Last Pain:  ?Vitals:  ? 01/11/22 1730  ?TempSrc:   ?PainSc: 4   ? ? ?LLE Motor Response: Purposeful movement (01/11/22 1730) ?LLE Sensation: Full sensation (01/11/22 1730) ?RLE Motor Response: Purposeful movement (01/11/22 1730) ?RLE Sensation: Full sensation (01/11/22 1730) ?  ?  ? ?Majel Giel A. ? ? ? ? ?

## 2022-01-11 NOTE — Op Note (Signed)
01/11/2022 ? ?4:45 PM ? ?PATIENT:  [name]  71 y.o. female ? ?PRE-OPERATIVE DIAGNOSIS: Right parietal brain mass ? ?POST-OPERATIVE DIAGNOSIS:  same ? ?PROCEDURE: Right posterior parietal craniotomy for resection of brain mass utilizing frameless stereotactic navigation ? ?SURGEON:  Sherley Bounds, MD ? ?ASSISTANTS: Glenford Peers FNP ? ?ANESTHESIA:   General ? ?EBL: 50 ml ? ?Total I/O ?In: 10 [IV Piggyback:10] ?Out: 600 [Urine:550; Blood:50] ? ?BLOOD ADMINISTERED: none ? ?DRAINS: None ? ?SPECIMEN: Brain mass sent for permanent pathology ? ?INDICATION FOR PROCEDURE: This patient presented with status changes. Imaging showed large right parietal brain mass.  She has a history of lung cancer.  She was treated with radiation therapy and presents today for planned craniotomy for resection of the lesion.. Patient understood the risks, benefits, and alternatives and potential outcomes and wished to proceed. ? ?PROCEDURE DETAILS: The patient was taken to the operating room and after induction of adequate generalized endotracheal anesthesia, the head was affixed in a 3 point [place] head rest, and turned to the left to expose the right steer parietal region.  We applied our frame and registered our points for BrainLab frameless.  Tactile guidance.  Checked our points and found them to be accurate.  We planned our incision utilizing stereotactic guidance.  the head was shaved and then cleaned and then prepped with DuraPrep and draped in the usual sterile fashion. 10 cc of local anesthetic was injected, and a curvilinear incision was made on the left of the head. Raney clips were placed to establish hemostasis of the scalp, the muscle was reflected with the scalp flap, to expose the the left posterior parietal region.  2  burr holes were placed, and a craniotomy flap was turned utilizing the high-speed, air powered drill. The flap was then placed in bacitracin-containing saline solution, and the dura was opened to expose  the underlying brain.  BrainLab was used to [employer] where we would start her corticectomy.  A small corticectomy was created with the bipolar forceps, and the underlying tumor was then identified. The tumor was then removed by dissecting in the gliotic plane around the tumor and removing it en bloc.. Once the tumor was removed we checked our resection with BrainLab, I dried the surgical bed with bipolar cautery and  Surgifoam, and then lined the surgical bed with Surgicel. I  closed the dura with interrupted 4-0 Nurolon suture.  Dural tack up sutures were placed.  The dura was lined with Gelfoam, and the craniotomy flap was replaced with doggie-bone plates. The wound was copiously irrigated. A subgaleal drain was placed, and the galea was then closed with interrupted 2-0 Vicryl suture. The skin was then closed with staples a sterile dressing was applied. The patient was then taken out of the 3-point Mayfield headrest and awakened from general anesthesia, and transported to the recovery room in stable condition. At the end of the procedure all sponge, needle, and instrument counts were correct.  My nurse practitioner was scrubbed and assisted for the entire case including the exposure, the craniotomy, the resection of the tumor, and the closure. ? ? ?PLAN OF CARE: Admit to inpatient  ? ?PATIENT DISPOSITION:  PACU - hemodynamically stable. ?  ?Delay start of Pharmacological VTE agent (>24hrs) due to surgical blood loss or risk of bleeding:  yes ? ? ? ? ? ? ? ? ? ? ? ? ? ? ?

## 2022-01-11 NOTE — Anesthesia Procedure Notes (Signed)
Arterial Line Insertion ?Start/End4/28/2023 1:55 PM, 01/11/2022 2:00 PM ?Performed by: Josephine Igo, MD, CRNA ? Left, radial was placed ?Catheter size: 20 G ? ?Attempts: 1 ?Procedure performed without using ultrasound guided technique. ?Following insertion, dressing applied and Biopatch. ?Post procedure assessment: normal and unchanged ? ?Patient tolerated the procedure well with no immediate complications. ? ? ?

## 2022-01-11 NOTE — Progress Notes (Signed)
Subjective: ?Patient reports no headaches, doing well ? ?Objective: ?Vital signs in last 24 hours: ?Temp:  [98 ?F (36.7 ?C)-99.2 ?F (37.3 ?C)] 98.7 ?F (37.1 ?C) (04/28 1248) ?Pulse Rate:  [53-67] 57 (04/28 1248) ?Resp:  [14-16] 16 (04/28 1248) ?BP: (99-112)/(60-66) 99/66 (04/28 1248) ?SpO2:  [96 %-98 %] 96 % (04/28 1248) ? ?Intake/Output from previous day: ?04/27 0701 - 04/28 0700 ?In: 480 [P.O.:480] ?Out: -  ?Intake/Output this shift: ?No intake/output data recorded. ? ?Neurologic: Grossly normal ? ?Lab Results: ?Lab Results  ?Component Value Date  ? WBC 14.3 (H) 01/07/2022  ? HGB 13.0 01/07/2022  ? HCT 38.7 01/07/2022  ? MCV 93.9 01/07/2022  ? PLT 263 01/07/2022  ? ?Lab Results  ?Component Value Date  ? INR 0.9 10/17/2020  ? ?BMET ?Lab Results  ?Component Value Date  ? NA 137 01/07/2022  ? K 4.6 01/07/2022  ? CL 101 01/07/2022  ? CO2 28 01/07/2022  ? GLUCOSE 119 (H) 01/07/2022  ? BUN 14 01/07/2022  ? CREATININE 0.55 01/07/2022  ? CALCIUM 8.8 (L) 01/07/2022  ? ? ?Studies/Results: ?No results found. ? ?Assessment/Plan: ?R craniotomy for brain met. She understands the rsisk and benefits of the procedure (bleeding, infection, stroke, loss of vision, NTW, paralysis, seizure, need for further surgery, incomplete resection, lack of relief, worsening, and anesthesia) and she wishes to proceed.  ? ?Estimated body mass index is 20.47 kg/m? as calculated from the following: ?  Height as of 01/08/22: 5' 2.5" (1.588 m). ?  Weight as of 01/08/22: 51.6 kg. ? ? ? LOS: 0 days  ? ? ?Eustace Moore ?01/11/2022, 1:55 PM ? ? ? ? ?

## 2022-01-11 NOTE — Anesthesia Procedure Notes (Signed)
Procedure Name: Intubation ?Date/Time: 01/11/2022 2:50 PM ?Performed by: Georgia Duff, CRNA ?Pre-anesthesia Checklist: Patient identified, Emergency Drugs available, Suction available and Patient being monitored ?Patient Re-evaluated:Patient Re-evaluated prior to induction ?Oxygen Delivery Method: Circle System Utilized ?Preoxygenation: Pre-oxygenation with 100% oxygen ?Induction Type: IV induction ?Ventilation: Mask ventilation without difficulty ?Laryngoscope Size: Mac and 4 ?Grade View: Grade I ?Tube type: Oral ?Tube size: 7.0 mm ?Number of attempts: 1 ?Airway Equipment and Method: Stylet and Oral airway ?Placement Confirmation: ETT inserted through vocal cords under direct vision, positive ETCO2 and breath sounds checked- equal and bilateral ?Secured at: 20 cm ?Tube secured with: Tape ?Dental Injury: Teeth and Oropharynx as per pre-operative assessment  ?Comments: Performed by EMT student under supervision by MD ? ? ? ? ?

## 2022-01-11 NOTE — Anesthesia Preprocedure Evaluation (Deleted)
Anesthesia Evaluation  ?Patient identified by MRN, date of birth, ID band ?Patient awake ? ? ? ?Reviewed: ?Allergy & Precautions, NPO status , Patient's Chart, lab work & pertinent test results, reviewed documented beta blocker date and time  ? ?Airway ?Mallampati: II ? ?TM Distance: >3 FB ?Neck ROM: Full ? ? ? Dental ? ?(+) Partial Upper, Dental Advisory Given ?  ?Pulmonary ?COPD,  COPD inhaler, Patient abstained from smoking., former smoker,  ?Hx/o NSCLC 8/21 S/P Resection and ChemoRx ?  ?Pulmonary exam normal ?breath sounds clear to auscultation ? ? ? ? ? ? Cardiovascular ?negative cardio ROS ?Normal cardiovascular exam ?Rhythm:Regular Rate:Normal ? ? ?  ?Neuro/Psych ?PSYCHIATRIC DISORDERS Depression negative neurological ROS ?   ? GI/Hepatic ?negative GI ROS, Neg liver ROS,   ?Endo/Other  ?Hypothyroidism Hypocalcemia ? Renal/GU ?negative Renal ROS  ?negative genitourinary ?  ?Musculoskeletal ?negative musculoskeletal ROS ?(+)  ? Abdominal ?  ?Peds ? Hematology ?negative hematology ROS ?(+)   ?Anesthesia Other Findings ? ? Reproductive/Obstetrics ? ?  ? ? ? ? ? ? ? ? ? ? ? ? ? ?  ?  ? ? ? ? ? ? ? ? ?Anesthesia Physical ?Anesthesia Plan ? ?ASA: 3 ? ?Anesthesia Plan: General  ? ?Post-op Pain Management: Minimal or no pain anticipated, Dilaudid IV and Precedex  ? ?Induction: Intravenous ? ?PONV Risk Score and Plan: 4 or greater and Treatment may vary due to age or medical condition, Ondansetron and Dexamethasone ? ?Airway Management Planned: Oral ETT ? ?Additional Equipment: Arterial line ? ?Intra-op Plan:  ? ?Post-operative Plan: Extubation in OR ? ?Informed Consent: I have reviewed the patients History and Physical, chart, labs and discussed the procedure including the risks, benefits and alternatives for the proposed anesthesia with the patient or authorized representative who has indicated his/her understanding and acceptance.  ? ? ? ?Dental advisory given ? ?Plan Discussed  with: CRNA and Anesthesiologist ? ?Anesthesia Plan Comments: (2 Peripheral IV's)  ? ? ? ? ? ? ?Anesthesia Quick Evaluation ? ?

## 2022-01-11 NOTE — H&P (Signed)
Subjective: ?Patient is a 71 y.o. 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 Onset of symptoms was a few weeks ago, gradually improving since that time.  MRI showed a right parietal lesion consistent with metastasis and she was admitted for preoperative radiation therapy.  She was admitted to rehab where she awaited surgical resection of the lesion today. ? ?Past Medical History:  ?Diagnosis Date  ? Cataract   ? COPD (chronic obstructive pulmonary disease) ([place])   ? 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  ?Past Surgical History:  ?Procedure Laterality Date  ? APPENDECTOMY  2008  ? COLON SURGERY  2008  ? colostomy-infection post op appendectomy  ? COLOSTOMY REVERSAL  2009  ? INTERCOSTAL NERVE BLOCK Right 10/19/2020  ? Procedure: INTERCOSTAL NERVE BLOCK;  Surgeon: [name], MD;  Location: [facility];  Service: Thoracic;  Laterality: Right;  ? NODE DISSECTION Right 10/19/2020  ? Procedure: NODE DISSECTION;  Surgeon: [name], MD;  Location: [facility];  Service: Thoracic;  Laterality: Right;  ? VIDEO BRONCHOSCOPY WITH ENDOBRONCHIAL NAVIGATION N/A 05/08/2020  ? Procedure: VIDEO BRONCHOSCOPY WITH ENDOBRONCHIAL NAVIGATION;  Surgeon: [name], MD;  Location: [facility];  Service: Thoracic;  Laterality: N/A;  ? VIDEO BRONCHOSCOPY WITH ENDOBRONCHIAL ULTRASOUND N/A 05/08/2020  ? Procedure: VIDEO BRONCHOSCOPY WITH ENDOBRONCHIAL ULTRASOUND;  Surgeon: [name], MD;  Location: [facility];  Service: Thoracic;  Laterality: N/A;  ?  ?No Known Allergies  ?Social History  ? ?Tobacco Use  ? Smoking status: Former  ?  Packs/day: 0.05  ?  Years: 50.00  ?  Pack years: 2.50  ?  Types: Cigarettes  ?  Quit date: 09/17/2019  ?  Years since quitting: 2.3  ? Smokeless tobacco: Never  ?Substance Use Topics  ? Alcohol use: Not Currently  ?  Alcohol/week: 10.0 standard drinks  ?  Types: 10 Cans of beer per week  ?  ?Family History  ?Problem Relation Age of Onset  ? Mental illness  Mother   ? Colon cancer Mother 87  ? Cancer Father   ? Colon polyps Neg Hx   ? Esophageal cancer Neg Hx   ? Stomach cancer Neg Hx   ? Rectal cancer Neg Hx   ? ?Prior to Admission medications   ?Medication Sig Start Date End Date Taking? Authorizing Provider  ?acetaminophen (TYLENOL) 325 MG tablet Take 1-2 tablets (325-650 mg total) by mouth every 4 (four) hours as needed for mild pain. 01/08/22   Love, Ivan Anchors, PA-C  ?albuterol (PROVENTIL HFA;VENTOLIN HFA) 108 (90 Base) MCG/ACT inhaler Inhale 2 puffs into the lungs every 4 (four) hours as needed for wheezing or shortness of breath (cough, shortness of breath or wheezing.). 10/29/17   [name], PA  ?alendronate (FOSAMAX) 70 MG tablet Take 70 mg by mouth once a week. 06/15/21   [provider]  ?Calcium Carbonate (CALCIUM 600 PO) Take 600 mg by mouth 2 (two) times daily.     [provider]  ?Cholecalciferol (VITAMIN D3 PO) Take 2,000 Units by mouth daily.    [provider]  ?dexamethasone (DECADRON) 4 MG/ML injection Inject 1 mL (4 mg total) into the vein every 6 (six) hours. 01/03/22   Regalado, Belkys A, MD  ?fluticasone (FLONASE) 50 MCG/ACT nasal spray Place 2 sprays into both nostrils daily. ?Patient taking differently: Place 1 spray into both nostrils in the morning and at bedtime. 01/06/18   [name], [place]  ?Fluticasone-Salmeterol (ADVAIR)  250-50 MCG/DOSE AEPB Inhale 1 puff into the lungs 2 (two) times daily.  02/05/18   [provider]  ?guaiFENesin (MUCINEX) 600 MG 12 hr tablet Take 1 tablet (600 mg total) by mouth 2 (two) times daily as needed for cough or to loosen phlegm. 10/28/20   Nani Skillern, PA-C  ?levothyroxine (SYNTHROID) 75 MCG tablet TAKE 1 TABLET BY MOUTH EVERY DAY BEFORE BREAKFAST ?Patient taking differently: Take 75 mcg by mouth daily before breakfast. 12/10/21   Curt Bears, MD  ?Multiple Vitamin (MULTIVITAMIN) tablet Take 1 tablet by mouth daily.    [provider]  ? ?  ?Review  of Systems ? ?Positive ROS: Negative ? ?All other systems have been reviewed and were otherwise negative with the exception of those mentioned in the HPI and as above. ? ?Objective: ?Vital signs in last 24 hours: ?Temp:  [98.7 ?F (37.1 ?C)-99.2 ?F (37.3 ?C)] 98.7 ?F (37.1 ?C) (04/28 1248) ?Pulse Rate:  [53-61] 57 (04/28 1248) ?Resp:  [16] 16 (04/28 1248) ?BP: (99-103)/(61-66) 99/66 (04/28 1248) ?SpO2:  [96 %-97 %] 96 % (04/28 1248) ? ?General Appearance: Alert, cooperative, no distress, appears stated age ?Head: Normocephalic, without obvious abnormality, atraumatic ?Eyes: PERRL, conjunctiva/corneas clear, EOM's intact, fundi benign, both eyes      ?Ears: Normal TM's and external ear canals, both ears ?Throat: Lips, mucosa, and tongue normal; teeth and gums normal ?Neck: Supple, symmetrical, trachea midline, no adenopathy; thyroid: No enlargement/tenderness/nodules; no carotid bruit or JVD ?Back: Symmetric, no curvature, ROM normal, no CVA tenderness ?Lungs: Clear to auscultation bilaterally, respirations unlabored ?Heart: Regular rate and rhythm, S1 and S2 normal, no murmur, rub or gallop ?Abdomen: Soft, non-tender, bowel sounds active all four quadrants, no masses, no organomegaly ?Extremities: Extremities normal, atraumatic, no cyanosis or edema ?Pulses: 2+ and symmetric all extremities ?Skin: Skin color, texture, turgor normal, no rashes or lesions ? ?NEUROLOGIC:  ? ?Mental status: alert and oriented, no aphasia, good attention span, Fund of knowledge/ memory ok ?Motor Exam - grossly normal ?Sensory Exam - grossly normal ?Reflexes:  ?Coordination - grossly normal ?Gait - grossly normal ?Balance - grossly normal ?Cranial Nerves: ?I: smell Not tested  ?II: visual acuity  OS: na    OD: na  ?II: visual fields Full to confrontation  ?II: pupils Equal, round, reactive to light  ?III,VII: ptosis None  ?III,IV,VI: extraocular muscles  Full ROM  ?V: mastication Normal  ?V: facial light touch sensation  Normal  ?V,VII:  corneal reflex  Present  ?VII: facial muscle function - upper  Normal  ?VII: facial muscle function - lower Normal  ?VIII: hearing Not tested  ?IX: soft palate elevation  Normal  ?IX,X: gag reflex Present  ?XI: trapezius strength  5/5  ?XI: sternocleidomastoid strength 5/5  ?XI: neck flexion strength  5/5  ?XII: tongue strength  Normal  ? ? ?Data Review ?Lab Results  ?Component Value Date  ? WBC 14.3 (H) 01/07/2022  ? HGB 13.0 01/07/2022  ? HCT 38.7 01/07/2022  ? MCV 93.9 01/07/2022  ? PLT 263 01/07/2022  ? ?Lab Results  ?Component Value Date  ? NA 137 01/07/2022  ? K 4.6 01/07/2022  ? CL 101 01/07/2022  ? CO2 28 01/07/2022  ? BUN 14 01/07/2022  ? CREATININE 0.55 01/07/2022  ? GLUCOSE 119 (H) 01/07/2022  ? ?Lab Results  ?Component Value Date  ? INR 0.9 10/17/2020  ? ? ?Assessment/Plan: ?Patient admitted for craniotomy for resection of right posterior parietal brain mass.  She understands the risk  benefits suspected outcome and wishes to proceed ? ? ?Eustace Moore ?01/11/2022 4:50 PM ? ?  ? ?

## 2022-01-11 NOTE — Progress Notes (Signed)
Patient transported to OR via bed, report given to short stay, CHG bath given, consent to be given in OR per OR nurse. Patient belongings given to support person at bedside.  ? ?[name] ? ?

## 2022-01-11 NOTE — Progress Notes (Signed)
Inpatient Rehabilitation Care Coordinator ?Discharge Note  ? ?Patient Details  ?Name: [name] ?MRN: [id number] ?Date of Birth: [birth year] ? ? ?Discharge location: D/c to acute for scheduled surgery ? ?Length of Stay: 7 days ? ?Discharge activity level: Supervision ? ?Home/community participation: Limited ? ?Patient response PR:FFMBWG Literacy - How often do you need to have someone help you when you read instructions, pamphlets, or other written material from your doctor or pharmacy?: Never ? ?Patient response YK:ZLDJTT Isolation - How often do you feel lonely or isolated from those around you?: Never ? ?Services provided included: MD, RD, PT, OT, SLP, RN, CM, Pharmacy, SW, TR ? ?Financial Services:  ?Charity fundraiser Utilized: Private Insurance ?BCBS Medicare ? ?Choices offered to/list presented to: N/A ? ?Follow-up services arranged:  ?  ? ? ?Patient response to transportation need: ?Is the patient able to respond to transportation needs?: Yes ?In the past 12 months, has lack of transportation kept you from medical appointments or from getting medications?: No ?In the past 12 months, has lack of transportation kept you from meetings, work, or from getting things needed for daily living?: No ? ? ? ?Comments (or additional information): ? ?Patient/Family verbalized understanding of follow-up arrangements:  Yes ? ?Individual responsible for coordination of the follow-up plan: contact pt ? ?Confirmed correct DME delivered: Rana Snare 01/11/2022   ? ?Rana Snare ?

## 2022-01-11 NOTE — Anesthesia Preprocedure Evaluation (Signed)
Anesthesia Evaluation  ?Patient identified by MRN, date of birth, ID band ?Patient awake ? ? ? ?Reviewed: ?Allergy & Precautions, NPO status , Patient's Chart, lab work & pertinent test results, reviewed documented beta blocker date and time  ? ?Airway ?Mallampati: II ? ?TM Distance: >3 FB ?Neck ROM: Full ? ? ? Dental ? ?(+) Dental Advisory Given, Edentulous Upper, Caps ?  ?Pulmonary ?COPD,  COPD inhaler, Patient abstained from smoking., former smoker,  ?Hx/o NSCLC 8/21 S/P Resection and ChemoRx ?  ?Pulmonary exam normal ?breath sounds clear to auscultation ? ? ? ? ? ? Cardiovascular ?negative cardio ROS ?Normal cardiovascular exam ?Rhythm:Regular Rate:Normal ? ? ?  ?Neuro/Psych ?PSYCHIATRIC DISORDERS Depression negative neurological ROS ?   ? GI/Hepatic ?negative GI ROS, Neg liver ROS,   ?Endo/Other  ?Hypothyroidism Hypocalcemia ? Renal/GU ?negative Renal ROS  ?negative genitourinary ?  ?Musculoskeletal ?negative musculoskeletal ROS ?(+)  ? Abdominal ?  ?Peds ? Hematology ?negative hematology ROS ?(+)   ?Anesthesia Other Findings ? ? Reproductive/Obstetrics ? ?  ? ? ? ? ? ? ? ? ? ? ? ? ? ?  ?  ? ? ? ? ? ? ? ? ?Anesthesia Physical ? ?Anesthesia Plan ? ?ASA: 3 ? ?Anesthesia Plan: General  ? ?Post-op Pain Management: Minimal or no pain anticipated, Dilaudid IV, Precedex and Toradol IV (intra-op)*  ? ?Induction: Intravenous ? ?PONV Risk Score and Plan: 4 or greater and Treatment may vary due to age or medical condition, Ondansetron and Dexamethasone ? ?Airway Management Planned: Oral ETT ? ?Additional Equipment: Arterial line ? ?Intra-op Plan:  ? ?Post-operative Plan: Extubation in OR ? ?Informed Consent: I have reviewed the patients History and Physical, chart, labs and discussed the procedure including the risks, benefits and alternatives for the proposed anesthesia with the patient or authorized representative who has indicated his/her understanding and acceptance.  ? ? ? ?Dental  advisory given ? ?Plan Discussed with: CRNA and Anesthesiologist ? ?Anesthesia Plan Comments: (2 Peripheral IV's)  ? ? ? ? ? ? ?Anesthesia Quick Evaluation ? ?

## 2022-01-12 LAB — BASIC METABOLIC PANEL
Anion gap: 6 (ref 5–15)
BUN: 12 mg/dL (ref 8–23)
CO2: 25 mmol/L (ref 22–32)
Calcium: 7.5 mg/dL — ABNORMAL LOW (ref 8.9–10.3)
Chloride: 104 mmol/L (ref 98–111)
Creatinine, Ser: 0.38 mg/dL — ABNORMAL LOW (ref 0.44–1.00)
GFR, Estimated: >60 mL/min (ref >60)
Glucose, Bld: 118 mg/dL — ABNORMAL HIGH (ref 70–99)
Potassium: 4.4 mmol/L (ref 3.5–5.1)
Sodium: 135 mmol/L (ref 135–145)

## 2022-01-12 NOTE — Evaluation (Signed)
Physical Therapy Evaluation ?Patient Details ?Name: [name] ?MRN: [id number] ?DOB: 10-01-1950 ?Today's Date: 01/12/2022 ? ?History of Present Illness ? The pt is a 71 yo female presenting from [facility] on 4/28 for R posterior partial craniotomy for resection of brain mass. PMH includes: NSCLC of upper lobe, s/p robotic assisted right thoracoscopy with right upper lobectomy and lymph node dissection under the care of Dr. Roxan Hockey on October 19, 2020, hypothyroidism, depression, COPD. ?  ?Clinical Impression ? Pt in bed upon arrival of PT, agreeable to evaluation at this time. Prior to admission the pt had progressed to use of RW for mobility and supervision for ADLs at [facility], but was independent prior to original admission. The pt now presents with limitations in functional mobility, dynamic stability, and endurance due to above dx, and will continue to benefit from skilled PT to address these deficits. The pt was able to complete sit-stand transfers with minG for safety, and completed initial bout of ambulation with good stability and use of RW. She was challenged by ambulation without UE support and had x2 minor LOB to L needing minA to assist with recovery. The pt was aware and able to initiate correction to LOB without being cued. Will continue to benefit from skilled PT acutely and after d/c to progress strength and stability, pt in agreement with current plan.  ?   ?   ? ?Recommendations for follow up therapy are one component of a multi-disciplinary discharge planning process, led by the attending physician.  Recommendations may be updated based on patient status, additional functional criteria and insurance authorization. ? ?Follow Up Recommendations Home health PT ? ?  ?Assistance Recommended at Discharge Intermittent Supervision/Assistance  ?Patient can return home with the following ? Assist for transportation;Direct supervision/assist for financial management;Help with stairs or ramp for entrance;Direct  supervision/assist for medications management;Assistance with cooking/housework;A little help with walking and/or transfers;A little help with bathing/dressing/bathroom ? ?  ?Equipment Recommendations Rolling walker (2 wheels) (shower chair)  ?Recommendations for Other Services ?    ?  ?Functional Status Assessment Patient has had a recent decline in their functional status and demonstrates the ability to make significant improvements in function in a reasonable and predictable amount of time.  ? ?  ?Precautions / Restrictions Precautions ?Precautions: Fall ?Restrictions ?Weight Bearing Restrictions: No  ? ?  ? ?Mobility ? Bed Mobility ?Overal bed mobility: Needs Assistance ?Bed Mobility: Supine to Sit ?  ?  ?Supine to sit: Supervision ?  ?  ?General bed mobility comments: no assist given, increased time to complete ?  ? ?Transfers ?Overall transfer level: Needs assistance ?Equipment used: Rolling walker (2 wheels) ?Transfers: Sit to/from Stand ?Sit to Stand: Min guard ?  ?  ?  ?  ?  ?General transfer comment: minG with no assist given, no instability but did need cues for hand placement ?  ? ?Ambulation/Gait ?Ambulation/Gait assistance: Min guard, Min assist ?Gait Distance (Feet): 250 Feet ?Assistive device: Rolling walker (2 wheels), None ?Gait Pattern/deviations: Step-through pattern, Decreased stride length, Staggering left ?Gait velocity: WFL ?  ?  ?General Gait Details: pt with x2 instances of stepping to the L when walking without DME, more stable with use of RW. able to identify error and initiate correction without cues or assist. gait speed WFL ? ?Stairs ?Stairs: Yes ?Stairs assistance: Min guard, Min assist ?Stair Management: One rail Right, No rails, Step to pattern, Forwards ?Number of Stairs: 2 ?General stair comments: minG with use of single rail, minA without rail ? ?  ? ?  Balance Overall balance assessment: Needs assistance, History of Falls ?Sitting-balance support: Feet supported ?Sitting  balance-Leahy Scale: Good ?Sitting balance - Comments: no overt LOB or lean ?  ?Standing balance support: No upper extremity supported, During functional activity ?Standing balance-Leahy Scale: Fair ?Standing balance comment: able to ambulate without DME, more stable with UE support ?  ?  ?  ?  ?  ?  ?  ?  ?  ?  ?  ?   ? ? ? ?Pertinent Vitals/Pain Pain Assessment ?Pain Assessment: No/denies pain  ? ? ?Home Living Family/patient expects to be discharged to:: Private residence ?Living Arrangements: Spouse/significant other ?Available Help at Discharge: Family;Available 24 hours/day;Friend(s) ?Type of Home: House ?Home Access: Stairs to enter ?Entrance Stairs-Rails: Right (pt and sister unsure, either no rails or single rail) ?Entrance Stairs-Number of Steps: 1 or 3 ?  ?Home Layout: One level ?Home Equipment: None ?Additional Comments: pt sister present and assisting with some answers to PLOF  ?  ?Prior Function Prior Level of Function : Independent/Modified Independent ?  ?  ?  ?  ?  ?  ?Mobility Comments: pt independent until 3-4 weeks prior to original admission, using RW at [facility] ?ADLs Comments: pt reports previously independent, had improved to supervision level at [facility] ?  ? ? ?Hand Dominance  ? Dominant Hand: Right ? ?  ?Extremity/Trunk Assessment  ? Upper Extremity Assessment ?Upper Extremity Assessment: Overall WFL for tasks assessed ?  ? ?Lower Extremity Assessment ?Lower Extremity Assessment: Overall WFL for tasks assessed ?  ? ?Cervical / Trunk Assessment ?Cervical / Trunk Assessment: Normal;Other exceptions ?Cervical / Trunk Exceptions: s/p R posterior crani  ?Communication  ? Communication: No difficulties  ?Cognition Arousal/Alertness: Awake/alert ?Behavior During Therapy: [facility] for tasks assessed/performed ?Overall Cognitive Status: Impaired/Different from baseline ?Area of Impairment: Attention, Safety/judgement, Awareness, Problem solving ?  ?  ?  ?  ?  ?  ?  ?  ?  ?Current Attention Level:  Selective ?Memory: Decreased short-term memory ?Following Commands: Follows one step commands consistently ?Safety/Judgement: Decreased awareness of safety, Decreased awareness of deficits ?Awareness: Emergent ?Problem Solving: Slow processing, Decreased initiation, Difficulty sequencing, Requires verbal cues ?General Comments: pt able to follow cues and instructions, mild delay at times but able to answer all questions without distraction or assist. per the pt and her sister, she appears to be at her baseline ?  ?  ? ?  ?General Comments General comments (skin integrity, edema, etc.): VSS on RA. BP stable pre and post mobility ? ?  ?   ? ?Assessment/Plan  ?  ?PT Assessment Patient needs continued PT services  ?PT Problem List Decreased strength;Decreased mobility;Decreased range of motion;Decreased activity tolerance;Decreased balance;Pain;Decreased cognition;Decreased knowledge of use of DME ? ?   ?  ?PT Treatment Interventions Therapeutic exercise;Functional mobility training;Therapeutic activities;Patient/family education;Gait training;Balance training;DME instruction   ? ?PT Goals (Current goals can be found in the Care Plan section)  ?Acute Rehab PT Goals ?Patient Stated Goal: return home ?PT Goal Formulation: With patient ?Time For Goal Achievement: 01/26/22 ?Potential to Achieve Goals: Good ? ?  ?Frequency Min 3X/week ?  ? ? ?   ?AM-PAC PT "6 Clicks" Mobility  ?Outcome Measure Help needed turning from your back to your side while in a flat bed without using bedrails?: A Little ?Help needed moving from lying on your back to sitting on the side of a flat bed without using bedrails?: A Little ?Help needed moving to and from a bed to a chair (including a  wheelchair)?: A Little ?Help needed standing up from a chair using your arms (e.g., wheelchair or bedside chair)?: A Little ?Help needed to walk in hospital room?: A Little ?Help needed climbing 3-5 steps with a railing? : A Little ?6 Click Score: 18 ? ?  ?End  of Session Equipment Utilized During Treatment: Gait belt ?Activity Tolerance: Patient tolerated treatment well ?Patient left: in chair;with call bell/phone within reach;with chair alarm set;with family/visitor present ?Nurse C

## 2022-01-12 NOTE — Progress Notes (Signed)
Subjective: ?Patient reports  doing great no significant headache no nausea no vision changes ? ?Objective: ?Vital signs in last 24 hours: ?Temp:  [97.8 ?F (36.6 ?C)-98.7 ?F (37.1 ?C)] 97.9 ?F (36.6 ?C) (04/29 0400) ?Pulse Rate:  [50-67] 67 (04/29 0700) ?Resp:  [10-31] 20 (04/29 0700) ?BP: (99-203)/(48-154) 119/67 (04/29 0700) ?SpO2:  [90 %-99 %] 96 % (04/29 0700) ?Arterial Line BP: (113-163)/(43-65) 148/65 (04/29 0700) ? ?Intake/Output from previous day: ?04/28 0701 - 04/29 0700 ?In: 1606.3 [P.O.:240; I.V.:1056.4; IV Piggyback:309.9] ?Out: 1335 [Urine:1285; Blood:50] ?Intake/Output this shift: ?No intake/output data recorded. ? ?Awake alert and oriented incision clean dry and intact ? ?Lab Results: ?No results for input(s): WBC, HGB, HCT, PLT in the last 72 hours. ?BMET ?Recent Labs  ?  01/12/22 ?0348  ?NA 135  ?K 4.4  ?CL 104  ?CO2 25  ?GLUCOSE 118*  ?BUN 12  ?CREATININE 0.38*  ?CALCIUM 7.5*  ? ? ?Studies/Results: ?No results found. ? ?Assessment/Plan: ?Postop day 1 craniotomy for evacuation of probable lung cancer met occipital patient doing very well awaiting MRI scan if MRI scan is clean patient to be transferred to progressive or for neurosurgical ? LOS: 1 day  ? ? ? ?Elaina Hoops ?01/12/2022, 7:32 AM ? ? ? ? ?

## 2022-01-12 NOTE — Progress Notes (Signed)
?  Transition of Care (TOC) Screening Note ? ? ?Patient Details  ?Name: KESLEIGH MORSON ?Date of Birth: 1950/10/18 ? ? ?Transition of Care (TOC) CM/SW Contact:    ?Alfredia Ferguson, LCSW ?Phone Number: ?01/12/2022, 8:21 AM ? ? ? ?Transition of Care Department [facility]) has reviewed patient and noted no immediate TOC needs pending continued medical work-up. TOC team will continue to monitor patient advancement through interdisciplinary progression rounds to support any identified discharge supports as needed. If new patient transition needs arise, please place a TOC consult or reach out to [facility] team.  ?  ?

## 2022-01-13 MED ORDER — DEXAMETHASONE 2 MG PO TABS: ORAL_TABLET | ORAL | 0 refills | Status: AC

## 2022-01-13 MED ORDER — DEXAMETHASONE SODIUM PHOSPHATE 4 MG/ML IJ SOLN: 4.0000 mg | Freq: Four times a day (QID) | INTRAMUSCULAR | 0 refills | Status: DC

## 2022-01-13 MED FILL — Thrombin For Soln Kit 20000 Unit: CUTANEOUS | Qty: 1 | Status: AC

## 2022-01-13 NOTE — Progress Notes (Signed)
Physical Therapy Treatment ?Patient Details ?Name: [name] ?MRN: [id number] ?DOB: 1951/01/23 ?Today's Date: 01/13/2022 ? ? ?History of Present Illness The pt is a 71 yo female presenting from [facility] on 4/28 for R posterior partial craniotomy for resection of brain mass. PMH includes: NSCLC of upper lobe, s/p robotic assisted right thoracoscopy with right upper lobectomy and lymph node dissection under the care of Dr. Roxan Hockey on October 19, 2020, hypothyroidism, depression, COPD. ? ?  ?PT Comments  ? ? The pt was able to demo good progress with ambulation distance and tolerate dynamic balance challenges. She continues to have slight deficits in awareness and problem solving, but the pt and her sister verbally agreed to education regarding increased caution with return home. The pt completed ~400 ft hallway ambulation without UE support, had x2 minor episodes of L lateral drift, and 2-3 instances of scissoring steps without LOB. The pt scored 15/24 on DGI and walks with a gait speed of 0.9m/s which both indicate she is still at increased risk of falls. We discussed fall risk reduction strategies with the pt and her sister at length, but I still recommend RW and HHPT to maximize safety with mobility in the home after d/c.  ? ?Dynamic Gait Index (DGI): 15/24 (<19 indicates increased risk for falls) ? ?Gait Speed: 0.62m/s. (Gait speed < 1.56m/s indicates increased risk of falls) ?  ?Recommendations for follow up therapy are one component of a multi-disciplinary discharge planning process, led by the attending physician.  Recommendations may be updated based on patient status, additional functional criteria and insurance authorization. ? ?Follow Up Recommendations ? Home health PT ?  ?  ?Assistance Recommended at Discharge Intermittent Supervision/Assistance  ?Patient can return home with the following Assist for transportation;Direct supervision/assist for financial management;Help with stairs or ramp for  entrance;Direct supervision/assist for medications management;Assistance with cooking/housework;A little help with walking and/or transfers;A little help with bathing/dressing/bathroom ?  ?Equipment Recommendations ? Rolling walker (2 wheels) (shower chair)  ?  ?Recommendations for Other Services   ? ? ?  ?Precautions / Restrictions Precautions ?Precautions: Fall ?Restrictions ?Weight Bearing Restrictions: No  ?  ? ?Mobility ? Bed Mobility ?Overal bed mobility: Independent ?  ?  ?  ?  ?  ?  ?General bed mobility comments: pt ambulting in room upon arrival ?  ? ?Transfers ?Overall transfer level: Independent ?Equipment used: None ?  ?  ?  ?  ?  ?  ?  ?General transfer comment: pt ambulating in room upon my arrival ?  ? ?Ambulation/Gait ?Ambulation/Gait assistance: Min guard ?Gait Distance (Feet): 400 Feet ?Assistive device: None ?Gait Pattern/deviations: Step-through pattern, Decreased stride length, Staggering left, Scissoring, Narrow base of support ?Gait velocity: WFL ?  ?  ?General Gait Details: pt with x1 instances of stepping to the L when walking without DME, intermittent narrow BOS with 2-3 instances of scissoring. no overt LOB or assist needed to correct ? ? ?Stairs ?  ?Stairs assistance: Min guard ?Stair Management: One rail Right, Forwards, Alternating pattern ?Number of Stairs: 5 ?General stair comments: minG with use of single rail ? ? ? ?  ?Balance Overall balance assessment: Needs assistance, History of Falls ?Sitting-balance support: Feet supported ?Sitting balance-Leahy Scale: Good ?Sitting balance - Comments: no overt LOB or lean ?  ?Standing balance support: No upper extremity supported, During functional activity ?Standing balance-Leahy Scale: Fair ?Standing balance comment: able to ambulate without DME, more stable with UE support ?  ?  ?  ?  ?  ?  ?  ?  ?  Standardized Balance Assessment ?Standardized Balance Assessment : Dynamic Gait Index ?  ?Dynamic Gait Index ?Level Surface: Mild  Impairment ?Change in Gait Speed: Mild Impairment ?Gait with Horizontal Head Turns: Mild Impairment ?Gait with Vertical Head Turns: Mild Impairment ?Gait and Pivot Turn: Mild Impairment ?Step Over Obstacle: Moderate Impairment ?Step Around Obstacles: Mild Impairment ?Steps: Mild Impairment ?Total Score: 15 ?  ? ?  ?Cognition Arousal/Alertness: Awake/alert ?Behavior During Therapy: [facility] for tasks assessed/performed ?Overall Cognitive Status: Impaired/Different from baseline ?Area of Impairment: Attention, Safety/judgement, Awareness, Problem solving ?  ?  ?  ?  ?  ?  ?  ?  ?  ?Current Attention Level: Selective ?  ?  ?Safety/Judgement: Decreased awareness of safety, Decreased awareness of deficits ?Awareness: Emergent ?Problem Solving: Slow processing, Decreased initiation, Difficulty sequencing, Requires verbal cues ?General Comments: pt following all cues and instructions, continues to demo slight deficits in awareness such as not opening doors all the way resulting in her hitting L shouder on the door when entering. pt states she is aware of deficits but makes little correction or adjustements in real time ?  ?  ? ?  ? ? ? ?PT Goals (current goals can now be found in the care plan section) Acute Rehab PT Goals ?Patient Stated Goal: return home ?PT Goal Formulation: With patient ?Time For Goal Achievement: 01/26/22 ?Potential to Achieve Goals: Good ?Progress towards PT goals: Progressing toward goals ? ?  ?Frequency ? ? ? Min 3X/week ? ? ? ?  ?PT Plan Current plan remains appropriate  ? ? ?   ?AM-PAC PT "6 Clicks" Mobility   ?Outcome Measure ? Help needed turning from your back to your side while in a flat bed without using bedrails?: A Little ?Help needed moving from lying on your back to sitting on the side of a flat bed without using bedrails?: A Little ?Help needed moving to and from a bed to a chair (including a wheelchair)?: A Little ?Help needed standing up from a chair using your arms (e.g., wheelchair or  bedside chair)?: A Little ?Help needed to walk in hospital room?: A Little ?Help needed climbing 3-5 steps with a railing? : A Little ?6 Click Score: 18 ? ?  ?End of Session Equipment Utilized During Treatment: Gait belt ?Activity Tolerance: Patient tolerated treatment well ?Patient left: with call bell/phone within reach;with family/visitor present;in bed ?Nurse Communication: Mobility status ?PT Visit Diagnosis: Other abnormalities of gait and mobility (R26.89);Difficulty in walking, not elsewhere classified (R26.2);Other symptoms and signs involving the nervous system (R29.898) ?  ? ? ?Time: 4235-3614 ?PT Time Calculation (min) (ACUTE ONLY): 17 min ? ?Charges:  $Gait Training: 8-22 mins          ?          ? ?West Carbo, PT, DPT  ? ?Acute Rehabilitation Department ?Pager #: [phone] - [phone] ? ? ?Sandra Cockayne ?01/13/2022, 10:43 AM ? ?

## 2022-01-13 NOTE — TOC Transition Note (Signed)
Transition of Care (TOC) - CM/SW Discharge Note ? ? ?Patient Details  ?Name: [name] ?MRN: [id number] ?Date of Birth: 1950-12-16 ? ?Transition of Care (TOC) CM/SW Contact:  ?Bartholomew Crews, RN ?Phone Number: [phone] ?01/13/2022, 11:22 AM ? ? ?Clinical Narrative:    ? ?Spoke with patient on her cell phone to discuss post acute transition home. Patient agreeable to [facility] PT. Offered choice of [place] agency. Referral accepted by [facility]. Patient will need HH PT order with [facility]. Discussed DME needs per recommendations. Referral to [employer] to deliver RW and shower stool to bedside. Patient aware that shower stool is likely private pay. Patient has transportation home. No further TOC needs identified at this time.  ? ?Final next level of care: [place] ?Barriers to Discharge: No Barriers Identified ? ? ?Patient Goals and CMS Choice ?Patient states their goals for this hospitalization and ongoing recovery are:: return home with [facility] PT ?CMS Medicare.gov Compare Post Acute Care list provided to:: Patient ?Choice offered to / list presented to : Patient ? ?Discharge Placement ?  ?           ?  ?  ?  ?  ? ?Discharge Plan and Services ?  ?  ?           ?DME Arranged: Shower stool, Walker rolling ?DME Agency: [employer] ?Date DME Agency Contacted: 01/13/22 ?Time DME Agency Contacted: 3500 ?Representative spoke with at DME Agency: Delana Meyer ?HH Arranged: PT ?[place] Agency: [facility] ?Date HH Agency Contacted: 01/13/22 ?Time [place]: [id number] ?Representative spoke with at [place]: Tommi Rumps ? ?Social Determinants of Health (SDOH) Interventions ?  ? ? ?Readmission Risk Interventions ?   ? View : No data to display.  ?  ?  ?  ? ? ? ? ? ?

## 2022-01-13 NOTE — Progress Notes (Addendum)
Pt discharge education and instructions completed with pt and sister at bedside. Both voices understanding and denies any questions. Pt provided MD Ronnald Ramp office telephone number for follow up. Per pt and sister, MD rounding this am had told them to follow up in the office for a CT but information was missing from the discharge instructions. Pt IV and telemetry removed. Pt discharge home with sister to transport her home. Pt home DME walker and 3-in-1 equipments delivered to pt at bedside. Pt to pick up electronically sent decadron prescription from preferred pharmacy on file. Pt transported off unit via wheelchair with belongings including DME, and sister to the side. Francis Gaines Jabron Weese RN. ?

## 2022-01-13 NOTE — Discharge Summary (Signed)
Physician Discharge Summary  Patient ID: [name] MRN: [id number] DOB/AGE: [id number] y.o.  Admit date: 01/11/2022 Discharge date: 01/13/2022  Admission Diagnoses: Right parietal brain mass      Discharge Diagnoses: same   Discharged Condition: good  Hospital Course: The patient was admitted on 01/11/2022 and taken to the operating room where the patient underwent crani for resection of metastatic tumor. The patient tolerated the procedure well and was taken to the recovery room and then to the ICU in stable condition. The hospital course was routine. There were no complications. The wound remained clean dry and intact. Pt had appropriate head soreness. No complaints of new pain or new N/T/W. The patient remained afebrile with stable vital signs, and tolerated a regular diet. The patient continued to increase activities, and pain was well controlled with oral pain medications.   Consults: None  Significant Diagnostic Studies:  Results for orders placed or performed during the hospital encounter of 61/95/09  Basic metabolic panel  Result Value Ref Range   Sodium 135 135 - 145 mmol/L   Potassium 4.4 3.5 - 5.1 mmol/L   Chloride 104 98 - 111 mmol/L   CO2 25 22 - 32 mmol/L   Glucose, Bld 118 (H) 70 - 99 mg/dL   BUN 12 8 - 23 mg/dL   Creatinine, Ser 0.38 (L) 0.44 - 1.00 mg/dL   Calcium 7.5 (L) 8.9 - 10.3 mg/dL   GFR, Estimated >60 >60 mL/min   Anion gap 6 5 - 15    DG Chest 2 View  Result Date: 12/30/2021 CLINICAL DATA:  71 year old female with lung cancer. Altered mental status and dizziness. 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 Fall. EXAM: CHEST - 2 VIEW COMPARISON:  Chest CT 08/16/2021 and earlier. FINDINGS: Semi upright AP and lateral views of the chest at 1001 hours. Chronic postoperative volume reduction in the right lung appears stable. Stable cardiac size and mediastinal contours. Visualized tracheal air column is within normal limits. No pneumothorax,  pulmonary edema, pleural effusion or acute pulmonary opacity identified. No acute osseous abnormality identified. Negative visible bowel gas. IMPRESSION: Post treatment changes to the right lung. No acute cardiopulmonary abnormality or acute traumatic injury identified. Electronically Signed   By: Genevie Ann M.D.   On: 12/30/2021 10:17   CT HEAD W & WO CONTRAST (5MM)  Addendum Date: 12/30/2021   ADDENDUM REPORT: 12/30/2021 10:18 ADDENDUM: Critical Value/emergent results were called by telephone at the time of interpretation on 12/30/2021 At 1015 hours to Dr. Isla Pence , who verbally acknowledged these results. Electronically Signed   By: Genevie Ann M.D.   On: 12/30/2021 10:18   Result Date: 12/30/2021 CLINICAL DATA:  71 year old female with lung cancer. Altered mental status and dizziness. Brain MRI in 2021 was negative for metastatic disease. EXAM: CT HEAD WITHOUT AND WITH CONTRAST TECHNIQUE: Contiguous axial images were obtained from the base of the skull through the vertex without and with intravenous contrast. RADIATION DOSE REDUCTION: This exam was performed according to the departmental dose-optimization program which includes automated exposure control, adjustment of the mA and/or kV according to patient size and/or use of iterative reconstruction technique. CONTRAST:  39mL OMNIPAQUE IOHEXOL 300 MG/ML  SOLN COMPARISON:  Brain MRI 05/18/2020. FINDINGS: Brain: Lobulated, nodular and enhancing mass in the posterior right hemisphere center at the right parieto-occipital sulcus is 34 by 40 x 33 mm (AP by transverse by CC) with a large volume of right hemisphere vasogenic edema. Associated intracranial mass effect with leftward  midline shift of up to 13 mm (coronal image 28). Mass effect on the right lateral ventricle and there is mild trapping of the left lateral ventricle now with some transependymal edema on that side suspected. The suprasellar cistern is effaced but other basilar cisterns remain patent. No  superimposed acute intracranial hemorrhage, acute cortically based infarct. Following contrast no other enhancing brain metastasis is identified. Vascular: Calcified atherosclerosis at the skull base. The major intracranial vascular structures appear to be enhancing as expected. Skull: No acute or suspicious osseous lesion identified. Sinuses/Orbits: Visualized paranasal sinuses and mastoids are stable and well aerated. Other: Visualized orbits and scalp soft tissues are within normal limits. IMPRESSION: 1. Relatively large 4 cm enhancing intra-axial mass in the posterior right hemisphere (parieto-occipital sulcus) with a large volume of hemispheric vasogenic edema most compatible with brain metastasis in this setting. 2. Intracranial mass effect with leftward midline shift of up to 13 mm, trapped left lateral ventricle, and effaced suprasellar cistern. Mild associated transependymal edema. 3. No other brain metastasis identified by CT. Electronically Signed: By: Genevie Ann M.D. On: 12/30/2021 10:10   CT Cervical Spine Wo Contrast  Result Date: 12/30/2021 CLINICAL DATA:  71 year old female with lung cancer. Altered mental status and dizziness. 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 EXAM: CT CERVICAL SPINE WITHOUT CONTRAST TECHNIQUE: Multidetector CT imaging of the cervical spine was performed without intravenous contrast. Multiplanar CT image reconstructions were also generated. RADIATION DOSE REDUCTION: This exam was performed according to the departmental dose-optimization program which includes automated exposure control, adjustment of the mA and/or kV according to patient size and/or use of iterative reconstruction technique. COMPARISON:  Head CT reported separately. FINDINGS: Alignment: Degenerative appearing straightening and mild reversal of cervical lordosis. Cervicothoracic junction alignment is within normal limits. Bilateral posterior element alignment is within normal limits. Skull base and  vertebrae: Visualized skull base is intact. No atlanto-occipital dissociation. C1 and C2 appear intact and aligned. No acute or suspicious osseous lesion identified in the cervical spine. Soft tissues and spinal canal: No prevertebral fluid or swelling. No visible canal hematoma. Bilateral cervical calcified carotid atherosclerosis. Disc levels: Advanced chronic disc and endplate degeneration E9-H3 and C5-C6. Widespread upper cervical facet arthropathy. No significant cervical spinal stenosis suspected. Upper chest: Mild architectural distortion in the right lung apex. Other: Partially visible right hemisphere vasogenic edema, see Head CT reported separately. IMPRESSION: 1. Cervical spine degeneration with no acute osseous abnormality identified. 2. Partially visible cerebral edema, see Head CT reported separately. Electronically Signed   By: Genevie Ann M.D.   On: 12/30/2021 10:14   MR Brain W Wo Contrast  Result Date: 12/31/2021 CLINICAL DATA:  Brain metastases, assess treatment response SRS Protocol for Pre-Op Radiation Treatment planning EXAM: MRI HEAD WITHOUT AND WITH CONTRAST TECHNIQUE: Multiplanar, multiecho pulse sequences of the brain and surrounding structures were obtained without and with intravenous contrast. CONTRAST:  76mL GADAVIST GADOBUTROL 1 MMOL/ML IV SOLN COMPARISON:  12/30/2021 FINDINGS: Brain: Redemonstration of peripherally enhancing right occipital mass with extensive surrounding edema. As before, there is effacement of the right lateral ventricle and leftward midline shift. Ventricles are similar in size. No acute abnormality. 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 Vascular: Major vessel flow voids at the skull base are preserved. Skull and upper cervical spine: Normal marrow signal is preserved. Sinuses/Orbits: Paranasal sinuses are aerated. Bilateral lens replacements. Other: Sella is partially empty.  Mastoid air cells are clear. IMPRESSION: Right occipital metastasis as seen  on the recent prior study. Stable extensive  associated edema with mass effect including leftward midline shift. No new finding. Electronically Signed   By: Macy Mis M.D.   On: 12/31/2021 14:41   MR Brain W and Wo Contrast  Result Date: 12/30/2021 CLINICAL DATA:  Brain/CNS neoplasm. Fall today. Dizziness. Personal history of lung cancer. Abnormal CT of the head. EXAM: MRI HEAD WITHOUT AND WITH CONTRAST TECHNIQUE: Multiplanar, multiecho pulse sequences of the brain and surrounding structures were obtained without and with intravenous contrast. CONTRAST:  70mL GADAVIST GADOBUTROL 1 MMOL/ML IV SOLN COMPARISON:  CT head without and with contrast 12/30/2021. MR head without and with contrast 05/18/2020 FINDINGS: Brain: The right occipital lobe tumor demonstrates predominantly peripheral enhancement. A solid nodule is present in the inferolateral aspect of the tumor measuring 13 mm. The peripherally enhancing component measures 31 x 35 x 32 mm. The study is moderately degraded by patient motion. No other focal lesions are present. Extensive white matter edema is again noted with extension into posterior limb of the internal capsule and the external capsule. This results in significant mass effect with midline shift measuring up to 11 mm. Sulci are effaced throughout the right hemisphere greatest posteriorly. 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 There is some mass effect on the brainstem. No other focal areas of edema are present. Vascular: Flow is present in the major intracranial arteries. Skull and upper cervical spine: The craniocervical junction is normal. Upper cervical spine is within normal limits. Marrow signal is unremarkable. Sinuses/Orbits: The paranasal sinuses and mastoid air cells are clear. Bilateral lens replacements are noted. Globes and orbits are otherwise unremarkable. IMPRESSION: 1. Peripherally enhancing lesion in the right occipital lobe measures 31 x 35 x 32 mm  likely represents focal metastasis from the patient's known lung cancer. 2. Extensive white matter edema and mass effect with midline shift measuring up to 11 mm. 3. Remote lacunar infarcts of the cerebellum bilaterally. Electronically Signed   By: San Morelle M.D.   On: 12/30/2021 14:54   CT CHEST ABDOMEN PELVIS W CONTRAST  Result Date: 01/01/2022 CLINICAL DATA:  Metastatic brain lesions seen on recent head CT and brain MRI. Prior history of lung cancer status post right upper lobe lobectomy. EXAM: CT CHEST, ABDOMEN, AND PELVIS WITH CONTRAST TECHNIQUE: Multidetector CT imaging of the chest, abdomen and pelvis was performed following the standard protocol during bolus administration of intravenous contrast. RADIATION DOSE REDUCTION: This exam was performed according to the departmental dose-optimization program which includes automated exposure control, adjustment of the mA and/or kV according to patient size and/or use of iterative reconstruction technique. CONTRAST:  5mL OMNIPAQUE IOHEXOL 300 MG/ML  SOLN COMPARISON:  Chest CT 08/16/2021.  PET-CT 04/06/2020 FINDINGS: CT CHEST FINDINGS Cardiovascular: The heart is normal in size. No pericardial effusion. The aorta is normal in caliber. No dissection. Stable atherosclerotic calcifications. Stable coronary artery calcifications. Mediastinum/Nodes: No mediastinal or hilar mass or lymphadenopathy. The esophagus is grossly normal. Lungs/Pleura: Stable surgical changes from a right upper lobe lobectomy. No findings to suggest recurrent tumor. Stable underlying emphysematous changes and areas of pulmonary scarring. No worrisome pulmonary lesions or pulmonary nodules to suggest metastatic disease. Musculoskeletal: No obvious breast masses. Bilateral breast macro calcifications appears stable. No supraclavicular or axillary adenopathy. The bony thorax is intact.  No worrisome bone lesions. CT ABDOMEN PELVIS FINDINGS Hepatobiliary: Very small low-attenuation  lesion in the inferior and posterior aspect of segment 6 this is unchanged since the PET-CT and is likely a benign cyst. No worrisome hepatic lesions or  intrahepatic biliary dilatation. Small gallstones are again demonstrated. No findings for acute cholecystitis. The common bile duct is within normal limits in caliber for age. Pancreas: No mass, inflammation or ductal dilatation. Spleen: Normal size.  No focal lesions. Adrenals/Urinary Tract: Adrenal glands and kidneys are unremarkable. No worrisome renal lesions or hydronephrosis. The bladder is unremarkable. Stomach/Bowel: The stomach, duodenum, small bowel and colon are unremarkable. No acute inflammatory changes, mass lesions or obstructive findings. Descending and sigmoid colon diverticulosis is stable. Vascular/Lymphatic: Stable advanced atherosclerotic calcifications involving the aorta, iliac arteries and branch vessel ostia. No aneurysm or dissection. The major venous structures are patent. No mesenteric or retroperitoneal mass or adenopathy. Reproductive: Stable retroverted uterus. The ovaries are unremarkable. Other: No pelvic mass or adenopathy. No free pelvic fluid collections. No inguinal mass or adenopathy. Small supraumbilical and infraumbilical abdominal wall hernias containing fat. Musculoskeletal: No worrisome lytic or sclerotic bone lesions to suggest metastatic disease. IMPRESSION: 1. Stable surgical changes from a right upper lobe lobectomy. No findings to suggest recurrent tumor or metastatic disease involving the chest, abdomen/pelvis or bony structures. 2. Stable emphysematous changes and pulmonary scarring. 3. Stable advanced atherosclerotic calcifications involving the thoracic and abdominal aorta and branch vessels including the coronary arteries. 4. Cholelithiasis. 5. Small supraumbilical and infraumbilical abdominal wall hernias containing fat. * Tracking Code: BO * Aortic Atherosclerosis (ICD10-I70.0) and Emphysema (ICD10-J43.9).  Electronically Signed   By: Marijo Sanes M.D.   On: 01/01/2022 12:24    Antibiotics:  Anti-infectives (From admission, onward)    Start     Dose/Rate Route Frequency Ordered Stop   01/11/22 2100  ceFAZolin (ANCEF) IVPB 1 g/50 mL premix        1 g 100 mL/hr over 30 Minutes Intravenous Every 8 hours 01/11/22 1812 01/12/22 0432       Discharge Exam: Blood pressure (!) 104/55, pulse 68, temperature 98.4 F (36.9 C), temperature source Oral, resp. rate 15, SpO2 96 %. Neurologic: Grossly normal Ambulating and voiding well incision cdi   Discharge Medications:   Allergies as of 01/13/2022   No Known Allergies      Medication List     STOP taking these medications    guaiFENesin 600 MG 12 hr tablet Commonly known as: MUCINEX       TAKE these medications    acetaminophen 325 MG tablet Commonly known as: TYLENOL Take 1-2 tablets (325-650 mg total) by mouth every 4 (four) hours as needed for mild pain.   albuterol 108 (90 Base) MCG/ACT inhaler Commonly known as: VENTOLIN HFA Inhale 2 puffs into the lungs every 4 (four) hours as needed for wheezing or shortness of breath (cough, shortness of breath or wheezing.).   alendronate 70 MG tablet Commonly known as: FOSAMAX Take 70 mg by mouth once a week.   dexamethasone 4 MG/ML injection Commonly known as: DECADRON Inject 1 mL (4 mg total) into the vein every 6 (six) hours.   fluticasone 50 MCG/ACT nasal spray Commonly known as: FLONASE Place 2 sprays into both nostrils daily.   Fluticasone-Salmeterol 250-50 MCG/DOSE Aepb Commonly known as: ADVAIR Inhale 1 puff into the lungs 2 (two) times daily.   levothyroxine 75 MCG tablet Commonly known as: SYNTHROID TAKE 1 TABLET BY MOUTH EVERY DAY BEFORE BREAKFAST What changed: See the new instructions.   multivitamin tablet Take 1 tablet by mouth daily.   VITAMIN D3 PO Take 2,000 Units by mouth daily.        Disposition: home   Final Dx: crani for resection  of brain  tumor  Discharge Instructions     Call MD for:  difficulty breathing, headache or visual disturbances   Complete by: As directed    Call MD for:  hives   Complete by: As directed    Call MD for:  persistant nausea and vomiting   Complete by: As directed    Call MD for:  redness, tenderness, or signs of infection (pain, swelling, redness, odor or green/yellow discharge around incision site)   Complete by: As directed    Call MD for:  severe uncontrolled pain   Complete by: As directed    Call MD for:  temperature >100.4   Complete by: As directed    Diet - low sodium heart healthy   Complete by: As directed    Driving Restrictions   Complete by: As directed    No driving for 1 weeks, no riding in the car for 1 week   Increase activity slowly   Complete by: As directed    No wound care   Complete by: As directed           Signed: Ocie Cornfield Deshana Rominger 01/13/2022, 8:19 AM

## 2022-01-14 ENCOUNTER — Ambulatory Visit: Payer: Medicare Other | Admitting: Internal Medicine

## 2022-01-14 ENCOUNTER — Encounter (HOSPITAL_COMMUNITY): Payer: Self-pay | Admitting: Neurological Surgery

## 2022-01-14 ENCOUNTER — Ambulatory Visit: Payer: Medicare Other

## 2022-01-14 ENCOUNTER — Other Ambulatory Visit: Payer: Medicare Other

## 2022-01-15 LAB — TYPE AND SCREEN
ABO/RH(D): A POS
Antibody Screen: NEGATIVE
Unit division: 0
Unit division: 0

## 2022-01-15 LAB — BPAM RBC
Blood Product Expiration Date: 202305162359
Blood Product Expiration Date: 202305162359
Unit Type and Rh: 6200
Unit Type and Rh: 6200

## 2022-01-16 LAB — SURGICAL PATHOLOGY

## 2022-01-21 ENCOUNTER — Inpatient Hospital Stay: Payer: Medicare Other

## 2022-01-22 ENCOUNTER — Other Ambulatory Visit: Payer: Self-pay | Admitting: Radiation Therapy

## 2022-01-22 DIAGNOSIS — C7931 Secondary malignant neoplasm of brain: Secondary | ICD-10-CM

## 2022-01-29 ENCOUNTER — Inpatient Hospital Stay: Payer: Medicare Other

## 2022-01-29 ENCOUNTER — Inpatient Hospital Stay: Payer: Medicare Other | Attending: Physician Assistant | Admitting: Internal Medicine

## 2022-01-29 ENCOUNTER — Encounter: Payer: Self-pay | Admitting: Internal Medicine

## 2022-01-29 VITALS — BP 118/60 | HR 80 | Temp 97.6°F | Resp 17 | Wt 115.5 lb

## 2022-01-29 DIAGNOSIS — C771 Secondary and unspecified malignant neoplasm of intrathoracic lymph nodes: Secondary | ICD-10-CM | POA: Diagnosis not present

## 2022-01-29 DIAGNOSIS — C3411 Malignant neoplasm of upper lobe, right bronchus or lung: Secondary | ICD-10-CM | POA: Insufficient documentation

## 2022-01-29 DIAGNOSIS — C3490 Malignant neoplasm of unspecified part of unspecified bronchus or lung: Secondary | ICD-10-CM

## 2022-01-29 DIAGNOSIS — Z79899 Other long term (current) drug therapy: Secondary | ICD-10-CM | POA: Insufficient documentation

## 2022-01-29 DIAGNOSIS — C7931 Secondary malignant neoplasm of brain: Secondary | ICD-10-CM | POA: Insufficient documentation

## 2022-01-29 LAB — CMP (CANCER CENTER ONLY)
ALT: 19 U/L (ref 0–44)
AST: 14 U/L — ABNORMAL LOW (ref 15–41)
Albumin: 3.7 g/dL (ref 3.5–5.0)
Alkaline Phosphatase: 40 U/L (ref 38–126)
Anion gap: 6 (ref 5–15)
BUN: 12 mg/dL (ref 8–23)
CO2: 28 mmol/L (ref 22–32)
Calcium: 8.7 mg/dL — ABNORMAL LOW (ref 8.9–10.3)
Chloride: 106 mmol/L (ref 98–111)
Creatinine: 0.53 mg/dL (ref 0.44–1.00)
GFR, Estimated: >60 mL/min (ref >60)
Glucose, Bld: 141 mg/dL — ABNORMAL HIGH (ref 70–99)
Potassium: 3.5 mmol/L (ref 3.5–5.1)
Sodium: 140 mmol/L (ref 135–145)
Total Bilirubin: 0.4 mg/dL (ref 0.3–1.2)
Total Protein: 6 g/dL — ABNORMAL LOW (ref 6.5–8.1)

## 2022-01-29 LAB — CBC WITH DIFFERENTIAL (CANCER CENTER ONLY)
Abs Immature Granulocytes: 0.11 10*3/uL — ABNORMAL HIGH (ref 0.00–0.07)
Basophils Absolute: 0 10*3/uL (ref 0.0–0.1)
Basophils Relative: 0 %
Eosinophils Absolute: 0 10*3/uL (ref 0.0–0.5)
Eosinophils Relative: 0 %
HCT: 35.8 % — ABNORMAL LOW (ref 36.0–46.0)
Hemoglobin: 11.8 g/dL — ABNORMAL LOW (ref 12.0–15.0)
Immature Granulocytes: 2 %
Lymphocytes Relative: 20 %
Lymphs Abs: 1.4 10*3/uL (ref 0.7–4.0)
MCH: 31.9 pg (ref 26.0–34.0)
MCHC: 33 g/dL (ref 30.0–36.0)
MCV: 96.8 fL (ref 80.0–100.0)
Monocytes Absolute: 0.4 10*3/uL (ref 0.1–1.0)
Monocytes Relative: 6 %
Neutro Abs: 5.1 10*3/uL (ref 1.7–7.7)
Neutrophils Relative %: 72 %
Platelet Count: 214 10*3/uL (ref 150–400)
RBC: 3.7 MIL/uL — ABNORMAL LOW (ref 3.87–5.11)
RDW: 13.5 % (ref 11.5–15.5)
WBC Count: 7.1 10*3/uL (ref 4.0–10.5)
nRBC: 0 % (ref 0.0–0.2)

## 2022-01-29 LAB — TSH: TSH: 0.402 u[IU]/mL (ref 0.350–4.500)

## 2022-01-29 NOTE — Progress Notes (Signed)
?    [facility] ?[phone]   [phone] ? ?OFFICE PROGRESS NOTE ? ?[name], [place] ?[address] ?[place] [address] ? ?DIAGNOSIS: Metastatic non-small cell lung cancer initially diagnosed as stage IIIA (T1b, N2, M0) non-small cell lung cancer, adenocarcinoma presented with right upper lobe lung nodule as well as right upper paratracheal adenopathy and suspicious tiny nodule in the right middle lobe.  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 ?PRIOR THERAPY:  ?1) Neoadjuvant systemic chemotherapy with carboplatin for AUC of 5, Alimta 500 mg/M2 and Keytruda 200 mg IV every 3 weeks.  First dose 05/30/2020. Status post 3 cycles. ?2) status post robotic assisted right thoracoscopy with right upper lobectomy and lymph node dissection under the care of Dr. Roxan Hockey on October 19, 2020.  There was residual tumor measuring 0.5 cm with lymph node metastasis involving 4R, 12 and hilar lymph nodes. ?3) Adjuvant systemic chemotherapy with carboplatin for AUC of 5 and Alimta 500 Mg/M2 every 3 weeks.  First dose was given December 11, 2020.  Status post 4 cycles.  Last dose of chemotherapy was given Feb 13, 2021. ?4) status post right posterior parietal craniotomy with resection of the brain mass under the care of Dr. Sherley Bounds followed by [facility] to the solitary brain metastasis under the care of Dr. Tammi Klippel.  The final pathology was consistent with metastatic adenocarcinoma of lung primary. ?5) Adjuvant immunotherapy with Atezolizumab 1200 Mg IV every 3 weeks.  First dose March 27, 2021.  Status post 11 cycles.  Last cycle was given on November 12, 2021 this treatment was discontinued secondary to disease progression in the brain. ? ?  ?CURRENT THERAPY: Observation ? ?INTERVAL HISTORY: ?[name] 71 y.o. female returns to the clinic today for follow-up visit accompanied by her sister-in-law [name].   The patient is feeling fine today with no concerning complaints except for fatigue.  She was diagnosed with a solitary brain metastasis in the right posterior parietal area in April 2023.  The patient underwent right posterior parietal craniotomy with resection of the solitary brain tumor followed by SRS to the resection cavity under the care of Dr. Tammi Klippel.  She has been on a tapered dose of Decadron which is completed today.  She denied having any chest pain, shortness of breath, cough or hemoptysis.  She denied having any fever or chills.  She has no nausea, vomiting, diarrhea or constipation.  She has no headache or visual changes.  She had repeat CT scan of the chest, abdomen and pelvis during her hospitalization last months and that showed no concerning findings for disease progression.  The patient is here today for evaluation and discussion of her further treatment options. ? ? ?MEDICAL HISTORY: ?Past Medical History:  ?Diagnosis Date  ? Cataract   ? COPD (chronic obstructive pulmonary disease) ([place])   ? NSCLC of upper lobe ([place]) 04/2020  ? ? ?ALLERGIES:  has No Known Allergies. ? ?MEDICATIONS:  ?Current Outpatient Medications  ?Medication Sig Dispense Refill  ? acetaminophen (TYLENOL) 325 MG tablet Take 1-2 tablets (325-650 mg total) by mouth every 4 (four) hours as needed for mild pain.    ? albuterol (PROVENTIL HFA;VENTOLIN HFA) 108 (90 Base) MCG/ACT inhaler Inhale 2 puffs into the lungs every 4 (four) hours as needed for wheezing or shortness of breath (cough, shortness of breath or wheezing.). 1 Inhaler 1  ? alendronate (FOSAMAX) 70 MG  tablet Take 70 mg by mouth once a week.    ? Cholecalciferol (VITAMIN D3 PO) Take 2,000 Units by mouth daily.    ? fluticasone (FLONASE) 50 MCG/ACT nasal spray Place 2 sprays into both nostrils daily. 16 g 12  ? Fluticasone-Salmeterol (ADVAIR) 250-50 MCG/DOSE AEPB Inhale 1 puff into the lungs 2 (two) times daily.     ? levothyroxine (SYNTHROID) 75 MCG tablet TAKE 1 TABLET  BY MOUTH EVERY DAY BEFORE BREAKFAST (Patient taking differently: Take 75 mcg by mouth daily before breakfast.) 30 tablet 2  ? Multiple Vitamin (MULTIVITAMIN) tablet Take 1 tablet by mouth daily.    ? ?No current facility-administered medications for this visit.  ? ? ?SURGICAL HISTORY:  ?Past Surgical History:  ?Procedure Laterality Date  ? APPENDECTOMY  2008  ? APPLICATION OF CRANIAL NAVIGATION Right 01/11/2022  ? Procedure: APPLICATION OF CRANIAL NAVIGATION;  Surgeon: Eustace Moore, MD;  Location: [facility];  Service: Neurosurgery;  Laterality: Right;  ? COLON SURGERY  2008  ? colostomy-infection post op appendectomy  ? COLOSTOMY REVERSAL  2009  ? CRANIOTOMY Right 01/11/2022  ? Procedure: RIGHT CRANIOTOMY FOR TUMOR RESECTION WITH BRAIN LAB;  Surgeon: Eustace Moore, MD;  Location: [facility];  Service: Neurosurgery;  Laterality: Right;  ? INTERCOSTAL NERVE BLOCK Right 10/19/2020  ? Procedure: INTERCOSTAL NERVE BLOCK;  Surgeon: [name], MD;  Location: [facility];  Service: Thoracic;  Laterality: Right;  ? NODE DISSECTION Right 10/19/2020  ? Procedure: NODE DISSECTION;  Surgeon: [name], MD;  Location: [facility];  Service: Thoracic;  Laterality: Right;  ? VIDEO BRONCHOSCOPY WITH ENDOBRONCHIAL NAVIGATION N/A 05/08/2020  ? Procedure: VIDEO BRONCHOSCOPY WITH ENDOBRONCHIAL NAVIGATION;  Surgeon: [name], MD;  Location: [facility];  Service: Thoracic;  Laterality: N/A;  ? VIDEO BRONCHOSCOPY WITH ENDOBRONCHIAL ULTRASOUND N/A 05/08/2020  ? Procedure: VIDEO BRONCHOSCOPY WITH ENDOBRONCHIAL ULTRASOUND;  Surgeon: [name], MD;  Location: [facility];  Service: Thoracic;  Laterality: N/A;  ? ? ?REVIEW OF SYSTEMS:  Constitutional: positive for fatigue ?Eyes: negative ?Ears, nose, mouth, throat, and face: negative ?Respiratory: negative ?Cardiovascular: negative ?Gastrointestinal: negative ?Genitourinary:negative ?Integument/breast: negative ?Hematologic/lymphatic:  negative ?Musculoskeletal:negative ?Neurological: negative ?Behavioral/Psych: negative ?Endocrine: negative ?Allergic/Immunologic: negative  ? ?PHYSICAL EXAMINATION: General appearance: alert, cooperative, fatigued, and no distress ?Head: Normocephalic, without obvious abnormality, atraumatic ?Neck: no adenopathy, no JVD, supple, symmetrical, trachea midline, and thyroid not enlarged, symmetric, no tenderness/mass/nodules ?Lymph nodes: Cervical, supraclavicular, and axillary nodes normal. ?Resp: clear to auscultation bilaterally ?Back: symmetric, no curvature. ROM normal. No CVA tenderness. ?Cardio: regular rate and rhythm, S1, S2 normal, no murmur, click, rub or gallop ?GI: soft, non-tender; bowel sounds normal; no masses,  no organomegaly ?Extremities: extremities normal, atraumatic, no cyanosis or edema ?Neurologic: Alert and oriented X 3, normal strength and tone. Normal symmetric reflexes. Normal coordination and gait ? ?ECOG PERFORMANCE STATUS: 1 - Symptomatic but completely ambulatory ? ?Blood pressure 118/60, pulse 80, temperature 97.6 ?F (36.4 ?C), temperature source Tympanic, resp. rate 17, weight 115 lb 8 oz (52.4 kg), SpO2 97 %. ? ?LABORATORY DATA: ?Lab Results  ?Component Value Date  ? WBC 7.1 01/29/2022  ? HGB 11.8 (L) 01/29/2022  ? HCT 35.8 (L) 01/29/2022  ? MCV 96.8 01/29/2022  ? PLT 214 01/29/2022  ? ? ?  Chemistry   ?   ?Component Value Date/Time  ? NA 140 01/29/2022 1436  ? NA 135 10/29/2017 1131  ? K 3.5 01/29/2022 1436  ? CL 106 01/29/2022 1436  ? CO2 28  01/29/2022 1436  ? BUN 12 01/29/2022 1436  ? BUN 5 (L) 10/29/2017 1131  ? CREATININE 0.53 01/29/2022 1436  ?    ?Component Value Date/Time  ? CALCIUM 8.7 (L) 01/29/2022 1436  ? ALKPHOS 40 01/29/2022 1436  ? AST 14 (L) 01/29/2022 1436  ? ALT 19 01/29/2022 1436  ? BILITOT 0.4 01/29/2022 1436  ?  ? ? ? ?RADIOGRAPHIC STUDIES: ?MR Brain W Wo Contrast ? ?Result Date: 12/31/2021 ?CLINICAL DATA:  Brain metastases, assess treatment response SRS Protocol  for Pre-Op Radiation Treatment planning EXAM: MRI HEAD WITHOUT AND WITH CONTRAST TECHNIQUE: Multiplanar, multiecho pulse sequences of the brain and surrounding structures were obtained without and with intravenous contrast. CONTRAST:  78m GADAVIST

## 2022-01-30 ENCOUNTER — Ambulatory Visit
Admission: RE | Admit: 2022-01-30 | Discharge: 2022-01-30 | Disposition: A | Discharge status: Home / Self Care | Payer: Medicare Other | Source: Ambulatory Visit | Attending: Radiation Oncology | Admitting: Radiation Oncology

## 2022-01-30 DIAGNOSIS — C7931 Secondary malignant neoplasm of brain: Secondary | ICD-10-CM

## 2022-01-30 IMAGING — MR MR HEAD WO/W CM
12 series · 48 of 48 positions shown · IV contrast (10ml Multihance)
Comparison: Brain MRI [DATE]

CLINICAL DATA: Brain metastasis, postop study

EXAM:
MRI HEAD WITHOUT AND WITH CONTRAST
TECHNIQUE: Multiplanar, multiecho pulse sequences of the brain and surrounding
structures were obtained without and with intravenous contrast.
CONTRAST:  10mL MULTIHANCE GADOBENATE DIMEGLUMINE 529 MG/ML IV SOLN

[Series 2: FLAIR · sagittal · 3.0mm · 0.75mm/px · 3 of 39 slices shown (1 of 2)]
[im 1/39]
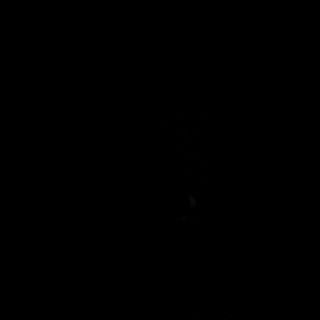
[im 20/39]
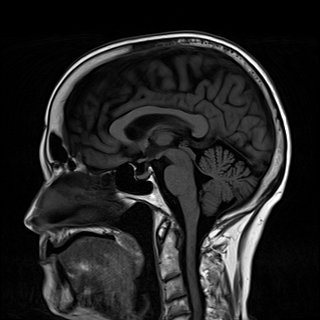
[im 39/39]
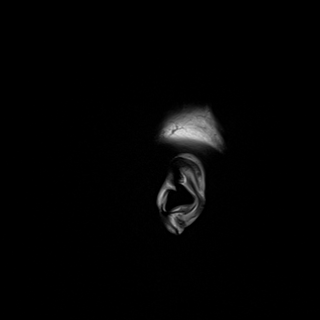

[Series 3: DWI · axial · 3.0mm · 1.50mm/px · z∈[-44,+104]mm · 4 of 78 slices shown (1 of 2)]
[im 1/78]
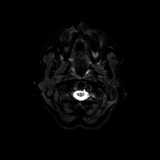
[im 26/78]
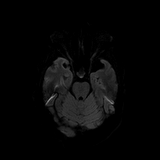
[im 52/78]
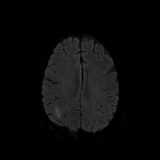
[im 78/78]
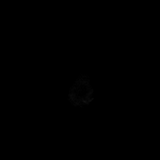

[Series 4: DWI · axial · 3.0mm · 1.50mm/px · z∈[-44,+104]mm · 2 of 38 slices shown (2 of 2)]
[im 1/38]
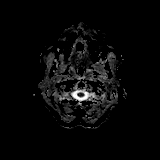
[im 38/38]
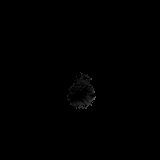

[Series 5: T2 · axial · 5.0mm · 0.57mm/px · 1 of 27 slices shown (1 of 2)]
[im 1/27]
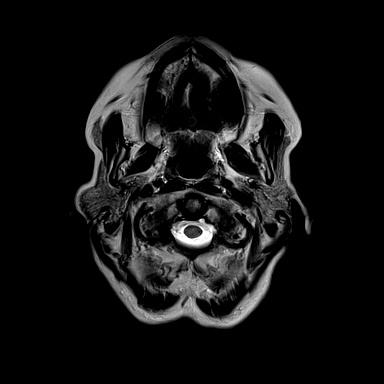

[Series 7: swi_images · axial · 1.5mm · 0.90mm/px · z∈[-43,+99]mm · 5 of 96 slices shown]
[im 1/96]
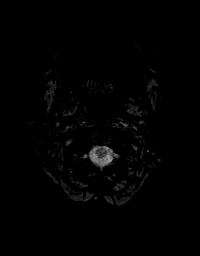
[im 24/96]
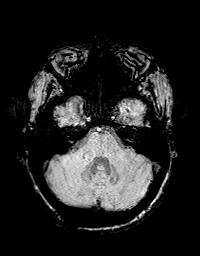
[im 48/96]
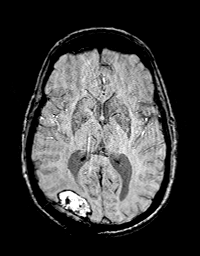
[im 72/96]
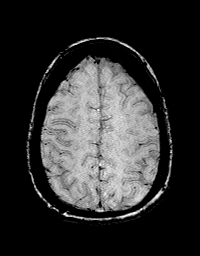
[im 96/96]
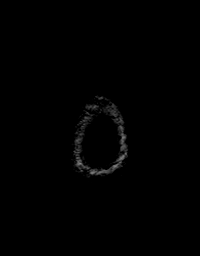

[Series 8: FLAIR · axial · 3.0mm · 0.86mm/px · z∈[-49,+110]mm · 3 of 54 slices shown (2 of 2)]
[im 1/54]
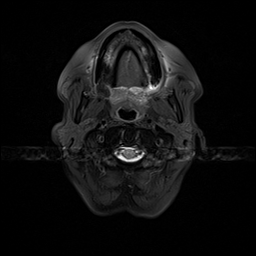
[im 27/54]
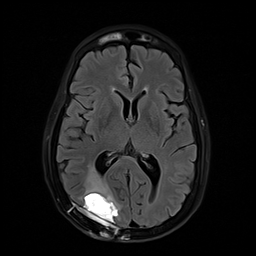
[im 54/54]
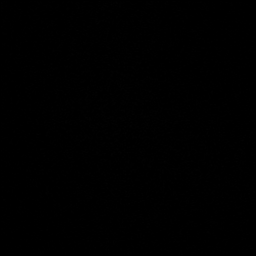

[Series 9: T2 · axial · non-contrast · 1.0mm · 0.86mm/px · z∈[-49,+107]mm · 8 of 160 slices shown (2 of 2)]
[im 1/160]
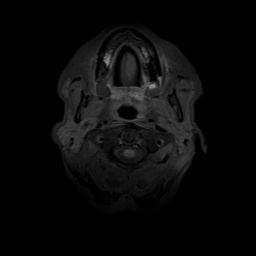
[im 23/160]
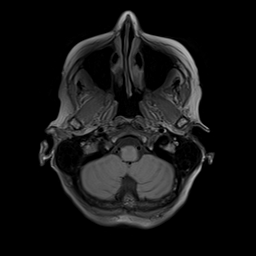
[im 46/160]
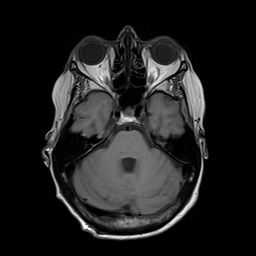
[im 69/160]
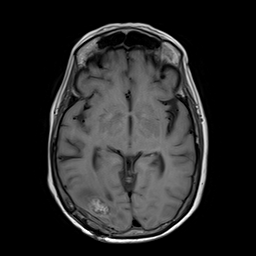
[im 91/160]
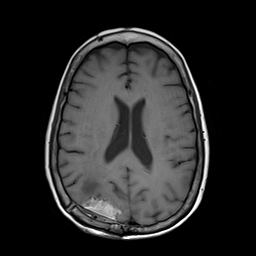
[im 114/160]
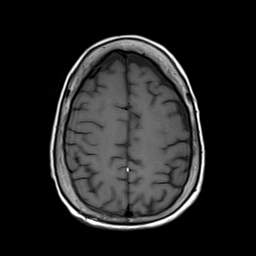
[im 137/160]
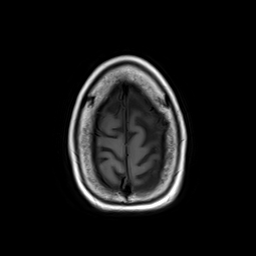
[im 160/160]
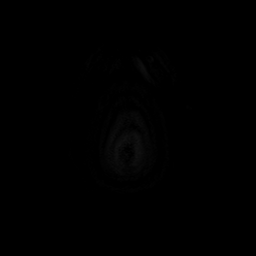

[Series 10: T2 post-contrast · coronal · 3.0mm · 0.57mm/px · 2 of 49 slices shown (1 of 2)]
[im 1/49]
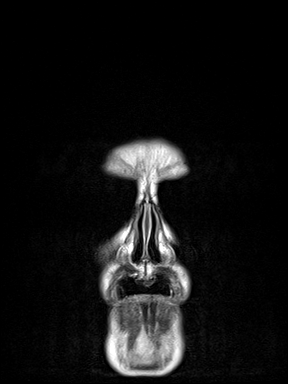
[im 49/49]
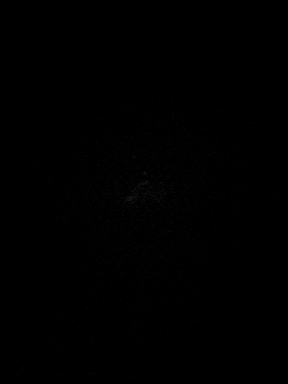

[Series 11: T2 post-contrast · axial · 1.0mm · 0.86mm/px · z∈[-49,+107]mm · 8 of 160 slices shown (2 of 2)]
[im 1/160]
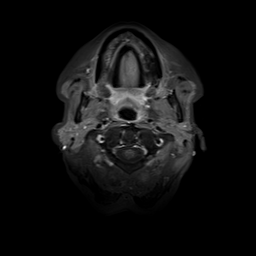
[im 23/160]
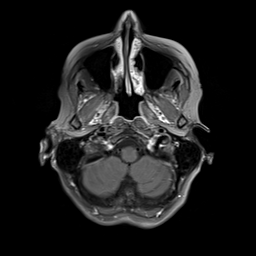
[im 46/160]
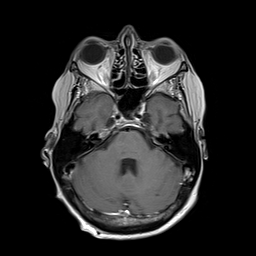
[im 69/160]
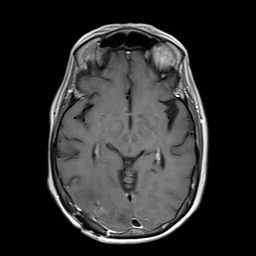
[im 91/160]
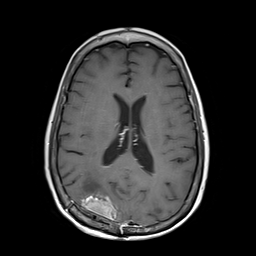
[im 114/160]
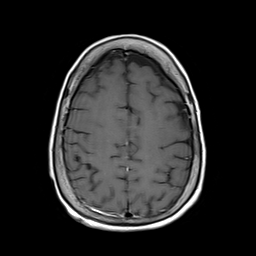
[im 137/160]
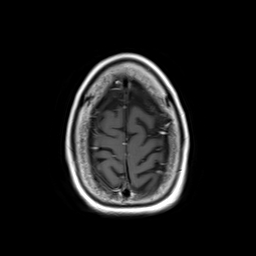
[im 160/160]
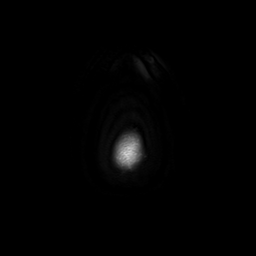

[Series 12: T1 post-contrast · axial · 1.0mm · 0.75mm/px · z∈[-51,+108]mm · 8 of 160 slices shown (1 of 2)]
[im 1/160]
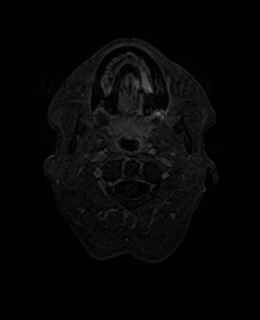
[im 23/160]
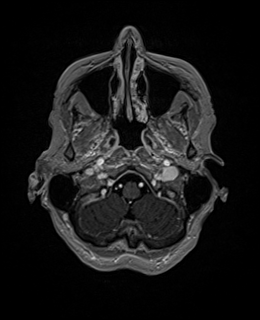
[im 46/160]
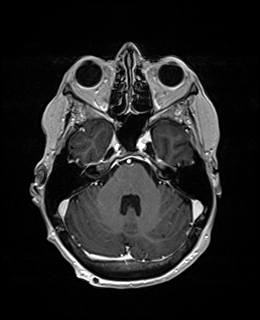
[im 69/160]
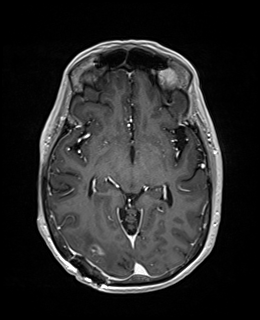
[im 91/160]
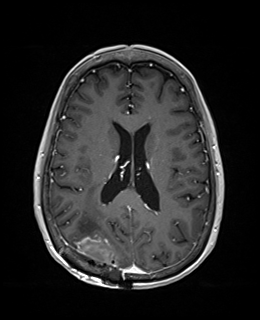
[im 114/160]
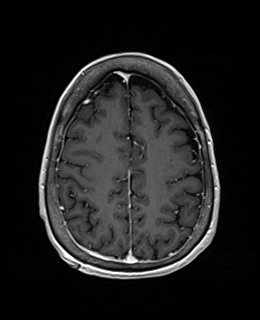
[im 137/160]
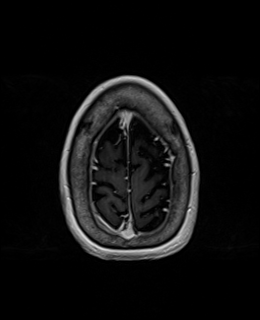
[im 160/160]
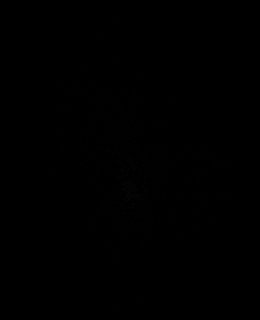

[Series 13: T1 post-contrast · coronal · 3.0mm · 0.57mm/px · 2 of 49 slices shown (2 of 2)]
[im 1/49]
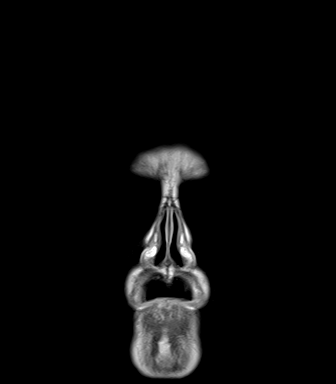
[im 49/49]
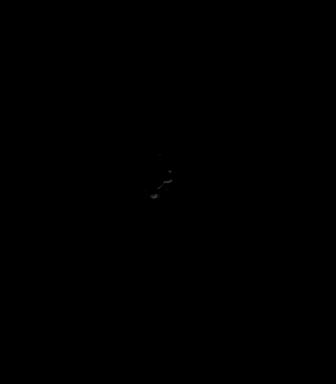

[Series 14: FLAIR post-contrast · sagittal · 3.0mm · 0.75mm/px · 2 of 39 slices shown]
[im 1/39]
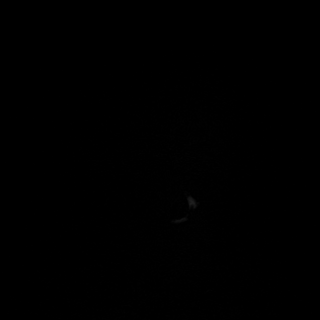
[im 39/39]
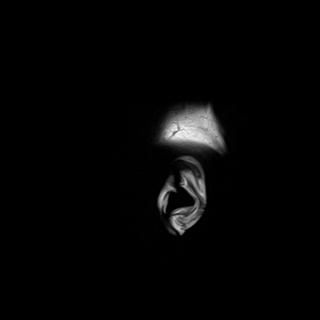

[48 of 48 positions shown; findings below may reference images not displayed]

FINDINGS: Brain: Postsurgical changes reflecting interval right
parieto-occipital craniotomy for mass resection are seen. The
resection cavity measures 3.2 cm by 1.5 cm in the axial plane with
internal intrinsically hyperintense blood products. There is a thin
extra-axial collection overlying the resection cavity measuring up
to 5 mm in the axial plane. The presence of intrinsically
hyperintense blood products makes evaluation of enhancement
difficult, but there is no definite residual enhancement. FLAIR
signal abnormality in the right cerebral hemisphere has markedly
improved compared to the preoperative study. Mass effect has also
significantly decreased, with re-expansion of the right lateral
ventricle and resolved leftward midline shift.

There is no other abnormal enhancement. There is no new mass lesion.

There is no evidence of acute infarct. Background parenchymal volume
is normal. Scattered foci of FLAIR signal abnormality in the
supratentorial white matter likely reflecting sequela of chronic
white matter microangiopathy are unchanged. Small remote infarcts in
the bilateral cerebellar hemispheres are unchanged.

Vascular: Normal flow voids.

Skull and upper cervical spine: Normal marrow signal.

Sinuses/Orbits: Paranasal sinuses are clear. Bilateral lens implants
are in place. The globes and orbits are otherwise unremarkable.

Other: None.
IMPRESSION: 1. Postsurgical changes reflecting interval right occipital mass
resection with no definite residual enhancement, though presence of
intrinsically T1 hyperintense blood products limits evaluation for
enhancement.
2. Surrounding edema has markedly improved with re-expansion of the
right lateral ventricle and resolved leftward midline shift.
3. No new lesions.

## 2022-01-30 MED ORDER — GADOBENATE DIMEGLUMINE 529 MG/ML IV SOLN
10.0000 mL | Freq: Once | INTRAVENOUS | Status: AC | PRN
  Administered 2022-01-30: 10 mL via INTRAVENOUS

## 2022-02-04 ENCOUNTER — Inpatient Hospital Stay: Payer: Medicare Other

## 2022-02-06 ENCOUNTER — Other Ambulatory Visit: Payer: Self-pay | Admitting: Radiation Therapy

## 2022-02-06 DIAGNOSIS — C7931 Secondary malignant neoplasm of brain: Secondary | ICD-10-CM

## 2022-02-12 ENCOUNTER — Telehealth: Payer: Self-pay | Admitting: Radiation Oncology

## 2022-02-12 ENCOUNTER — Encounter: Payer: Self-pay | Admitting: Urology

## 2022-02-12 NOTE — Progress Notes (Signed)
Radiation Oncology         [phone] ________________________________  Name: [name] MRN: [id number]  Date: 02/13/2022  DOB: 07-23-1951  Post Treatment Note  CC: [name], PA  [name], PA  Diagnosis:   71 yo woman with a 3.5 cm solitary right occipital brain metastasis from Stage T1b N2 M0 adenocarcinoma of the right upper lung with 90% PD-L1 expression on immunotherapy  Interval Since Last Radiation:  5 weeks  01/03/22, 01/07/22, 01/09/22: Fractionated Pre-OP SRS PTV1: The right occipital 35 mm target was treated 24 Gy in 3 fractions of 8 Gy each.    Narrative:  I spoke with the patient to conduct her routine scheduled 1 month follow up visit via telephone to spare the patient unnecessary potential exposure in the healthcare setting during the current COVID-19 pandemic.  The patient was notified in advance and gave permission to proceed with this visit format.  She tolerated radiation treatment relatively well.   She subsequently underwent surgical resection under the care of Dr. Ronnald Ramp on 01/11/2022 and has recovered well from her surgery.                           On review of systems, the patient states that she is doing well in general.  She specifically denies headaches, nausea, vomiting, dizziness/imbalance or any changes in her visual or auditory acuity.  She does have some patchy hair loss at the surgical incision as well as in the radiation treatment field but this is gradually filling in.  She is off steroids now without any rebound effect or difficulties.  She feels like she is gradually making progress in regards to her strength and activity level.  Overall, she is quite pleased with her progress to date. She had a recent posttreatment MRI brain scan on 01/30/2022 which is without any evidence of disease recurrence or new disease.  We reviewed these results today.  ALLERGIES:  has No Known Allergies.  Meds: Current Outpatient Medications  Medication Sig  Dispense Refill   acetaminophen (TYLENOL) 325 MG tablet Take 1-2 tablets (325-650 mg total) by mouth every 4 (four) hours as needed for mild pain.     albuterol (PROVENTIL HFA;VENTOLIN HFA) 108 (90 Base) MCG/ACT inhaler Inhale 2 puffs into the lungs every 4 (four) hours as needed for wheezing or shortness of breath (cough, shortness of breath or wheezing.). 1 Inhaler 1   alendronate (FOSAMAX) 70 MG tablet Take 70 mg by mouth once a week.     Cholecalciferol (VITAMIN D3 PO) Take 2,000 Units by mouth daily.     fluticasone (FLONASE) 50 MCG/ACT nasal spray Place 2 sprays into both nostrils daily. 16 g 12   Fluticasone-Salmeterol (ADVAIR) 250-50 MCG/DOSE AEPB Inhale 1 puff into the lungs 2 (two) times daily.      levothyroxine (SYNTHROID) 75 MCG tablet TAKE 1 TABLET BY MOUTH EVERY DAY BEFORE BREAKFAST (Patient taking differently: Take 75 mcg by mouth daily before breakfast.) 30 tablet 2   Multiple Vitamin (MULTIVITAMIN) tablet Take 1 tablet by mouth daily.     No current facility-administered medications for this encounter.    Physical Findings:  vitals were not taken for this visit.  Pain Assessment Pain Score: 0-No pain/10 Unable to assess due to telephone follow-up visit format.  Lab Findings: Lab Results  Component Value Date   WBC 7.1 01/29/2022   HGB 11.8 (L) 01/29/2022   HCT 35.8 (L) 01/29/2022   MCV  96.8 01/29/2022   PLT 214 01/29/2022     Radiographic Findings: MR Brain W Wo Contrast  Result Date: 01/31/2022 CLINICAL DATA:  Brain metastasis, postop study EXAM: MRI HEAD WITHOUT AND WITH CONTRAST TECHNIQUE: Multiplanar, multiecho pulse sequences of the brain and surrounding structures were obtained without and with intravenous contrast. CONTRAST:  34m MULTIHANCE GADOBENATE DIMEGLUMINE 529 MG/ML IV SOLN COMPARISON:  Brain MRI 12/31/2021 FINDINGS: Brain: Postsurgical changes reflecting interval right parieto-occipital craniotomy for mass resection are seen. The resection cavity  measures 3.2 cm by 1.5 cm in the axial plane with internal intrinsically hyperintense blood products. There is a thin extra-axial collection overlying the resection cavity measuring up to 5 mm in the axial plane. The presence of intrinsically hyperintense blood products makes evaluation of enhancement difficult, but there is no definite residual enhancement. FLAIR signal abnormality in the right cerebral hemisphere has markedly improved compared to the preoperative study. Mass effect has also significantly decreased, with re-expansion of the right lateral ventricle and resolved leftward midline shift. There is no other abnormal enhancement. There is no new mass lesion. There is no evidence of acute infarct. Background parenchymal volume is normal. Scattered foci of FLAIR signal abnormality in the supratentorial white matter likely reflecting sequela of chronic white matter microangiopathy are unchanged. 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 Vascular: Normal flow voids. Skull and upper cervical spine: Normal marrow signal. Sinuses/Orbits: Paranasal sinuses are clear. Bilateral lens implants are in place. The globes and orbits are otherwise unremarkable. Other: None. IMPRESSION: 1. Postsurgical changes reflecting interval right occipital mass resection with no definite residual enhancement, though presence of intrinsically T1 hyperintense blood products limits evaluation for enhancement. 2. Surrounding edema has markedly improved with re-expansion of the right lateral ventricle and resolved leftward midline shift. 3. No new lesions. Electronically Signed   By: PValetta MoleM.D.   On: 01/31/2022 08:49    Impression/Plan: 168 71yo woman with a 3.5 cm solitary right occipital brain metastasis from Stage T1b N2 M0 adenocarcinoma of the right upper lung with 90% PD-L1 expression on immunotherapy. She appears to have recovered well from the effects of her preop SRS treatment and is  currently without complaints.  She saw Dr. MJulien Nordmannon 01/29/2022 and the current plan is to continue on observation only with repeat scans in August 2023 prior to her next scheduled follow-up.  We reviewed the results of her recent posttreatment MRI brain scan from 01/30/2022 which is without any evidence of disease recurrence or new disease.  Therefore, we will plan to proceed with serial MRI brain scans every 3 months to monitor for any evidence of disease progression or recurrence and I will contact her by phone following each scan to review the results and any recommendations from the multidisciplinary brain conference.  She appears to have a good understanding of her disease and our recommendations and is comfortable and in agreement with the stated plan.  She knows that she is welcome to call at anytime in the interim with any questions or concerns.    ANicholos Johns PA-C

## 2022-02-12 NOTE — Progress Notes (Signed)
  Radiation Oncology         [phone] ________________________________  Name: [name] MRN: [id number]  Date: 01/09/2022  DOB: [birth year]  End of Treatment Note  Diagnosis:   71 yo woman with a 3.5 cm solitary right occipital brain metastasis from adenocarcinoma of the right upper lung.     Indication for treatment:  Palliation       Radiation treatment dates:   01/03/22, 01/07/22, 01/09/22  Site/dose/beams/energy: Fractionated Pre-OP SRS PTV1: Right Occipital 35 mm target was treated using 4 Rapid Arc VMAT Beams to a fractional prescription dose of 8 Gy to be repeated for a total of 3 fractions to a total prescription dose of 24 Gy.  ExacTrac registration was performed for each couch angle.  The 100% isodose line was prescribed.  6 MV X-rays were delivered in the flattening filter free beam mode.  Narrative: The patient tolerated radiation treatment relatively well.  Plan: The patient has completed radiation treatment. The patient will return to radiation oncology clinic for routine followup in one month. I advised them to call or return sooner if they have any questions or concerns related to their recovery or treatment. ________________________________  Sheral Apley. Tammi Klippel, M.D.

## 2022-02-12 NOTE — Telephone Encounter (Signed)
Returned call after receiving VM from pt's husband. LVM indicating appt is via telephone and they DO NOT need to come to office.

## 2022-02-12 NOTE — Progress Notes (Signed)
Telephone appointment. I verified pt's identity and began nursing interview. Pt. Is doing well. No issues reported at this time.  Meaningful use complete.  Reminded patient of her 9:00am-02/13/22 telephone appointment w/ Ashlyn Bruning PA-C. I left my extension [phone] in case patient needs anything. Patient verbalized understanding.  Patient preferred contact [phone]

## 2022-02-13 ENCOUNTER — Ambulatory Visit
Admission: RE | Admit: 2022-02-13 | Discharge: 2022-02-13 | Disposition: A | Discharge status: Home / Self Care | Payer: Medicare Other | Source: Ambulatory Visit | Attending: Urology | Admitting: Urology

## 2022-02-13 DIAGNOSIS — C3490 Malignant neoplasm of unspecified part of unspecified bronchus or lung: Secondary | ICD-10-CM

## 2022-02-27 ENCOUNTER — Encounter: Payer: Medicare Other | Attending: Physical Medicine & Rehabilitation | Admitting: Physical Medicine & Rehabilitation

## 2022-02-27 ENCOUNTER — Encounter: Payer: Self-pay | Admitting: Physical Medicine & Rehabilitation

## 2022-02-27 VITALS — BP 98/60 | HR 84 | Ht 62.5 in | Wt 118.0 lb

## 2022-02-27 DIAGNOSIS — C7931 Secondary malignant neoplasm of brain: Secondary | ICD-10-CM | POA: Diagnosis present

## 2022-02-27 DIAGNOSIS — C3490 Malignant neoplasm of unspecified part of unspecified bronchus or lung: Secondary | ICD-10-CM | POA: Insufficient documentation

## 2022-02-27 NOTE — Patient Instructions (Signed)
RETURN TO DRIVING PLAN:  WITH THE SUPERVISION OF A LICENSED DRIVER, PLEASE DRIVE IN AN EMPTY PARKING LOT FOR AT LEAST 2-3 TRIALS TO TEST REACTION TIME, VISION, USE OF EQUIPMENT IN CAR, ETC.  IF SUCCESSFUL WITH THE PARKING LOT DRIVING, PROCEED TO SUPERVISED DRIVING TRIALS IN YOUR NEIGHBORHOOD STREETS AT LOW TRAFFIC TIMES TO TEST OBSERVATION TO TRAFFIC SIGNALS, REACTION TIME, ETC. PLEASE ATTEMPT AT LEAST 2-3 TRIALS IN YOUR NEIGHBORHOOD.  IF NEIGHBORHOOD DRIVING IS SUCCESSFUL, YOU MAY PROCEED TO DRIVING IN BUSIER AREAS IN YOUR COMMUNITY WITH SUPERVISION OF A LICENSED DRIVER. PLEASE ATTEMPT AT LEAST 4-5 TRIALS.  IF COMMUNITY DRIVING IS SUCCESSFUL, YOU MAY PROCEED TO DRIVING ALONE, DURING THE DAY TIME, IN NON-PEAK TRAFFIC TIMES. YOU SHOULD DRIVE NO FURTHER THAN 30 MINUTES IN ONE DIRECTION. PLEASE DO NOT DRIVE IF YOU FEEL FATIGUED OR UNDER THE INFLUENCE OF MEDICATION.

## 2022-02-27 NOTE — Progress Notes (Signed)
Subjective:    Patient ID: [name], female    DOB: Mar 10, 1951, 71 y.o.   MRN: [id number]  HPI  [name] is here after rehab admission for her lung mets to her brain/subsequent craniotomy. Her most recent MRI did not show any recurrent disease! NS has signed off.   She is staying active at home.  She does some housecleaning and she is working in the yard as well.  She does some walking also.  She can go for about 20 minutes before she has to rest and then she is able to go back added again.  She is breathing fairly well.  She does not report any dizziness or sense of falling.  She is not having any pain currently except for some sensitivity at the operative site when she sleeps at night.  She denies any headaches.  She does not have any chest pain or limb discomfort.  She is sleeping well.  Bowel and bladder function are regulated.  Mood is very positive and her family is supportive of her.  Pain Inventory Average Pain 0 Pain Right Now 0 My pain is  no pain  LOCATION OF PAIN  head  BOWEL Number of stools per week: 7 Oral laxative use Yes  Type of laxative miralax Enema or suppository use No  History of colostomy No  Incontinent No   BLADDER Normal In and out cath, frequency . Able to self cath  . Bladder incontinence No  Frequent urination No  Leakage with coughing No  Difficulty starting stream No  Incomplete bladder emptying No    Mobility walk without assistance ability to climb steps?  yes do you drive?  no  Function retired  Neuro/Psych No problems in this area  Prior [facility] F/U  Physicians involved in [facility] care [facility] F/U   Family History  Problem Relation Age of Onset   Mental illness Mother    Colon cancer Mother 15   Cancer Father    Colon polyps Neg Hx    Esophageal cancer Neg Hx    Stomach cancer Neg Hx    Rectal cancer Neg Hx    Social History   Socioeconomic History   Marital status: Married    Spouse name: Not  on file   Number of children: Not on file   Years of education: Not on file   Highest education level: Not on file  Occupational History   Not on file  Tobacco Use   Smoking status: Former    Packs/day: 0.05    Years: 50.00    Total pack years: 2.50    Types: Cigarettes    Quit date: 09/17/2019    Years since quitting: 2.4   Smokeless tobacco: Never  Vaping Use   Vaping Use: Never used  Substance and Sexual Activity   Alcohol use: Not Currently    Alcohol/week: 10.0 standard drinks of alcohol    Types: 10 Cans of beer per week   Drug use: No   Sexual activity: Not on file  Other Topics Concern   Not on file  Social History Narrative   Not on file   Social Determinants of Health   Financial Resource Strain: Not on file  Food Insecurity: Not on file  Transportation Needs: Not on file  Physical Activity: Not on file  Stress: Not on file  Social Connections: Not on file   Past Surgical History:  Procedure Laterality Date   APPENDECTOMY  6734   APPLICATION  OF CRANIAL NAVIGATION Right 01/11/2022   Procedure: APPLICATION OF CRANIAL NAVIGATION;  Surgeon: Eustace Moore, MD;  Location: [place];  Service: Neurosurgery;  Laterality: Right;   COLON SURGERY  2008   colostomy-infection post op appendectomy   COLOSTOMY REVERSAL  2009   CRANIOTOMY Right 01/11/2022   Procedure: RIGHT CRANIOTOMY FOR TUMOR RESECTION WITH BRAIN LAB;  Surgeon: Eustace Moore, MD;  Location: [place];  Service: Neurosurgery;  Laterality: Right;   INTERCOSTAL NERVE BLOCK Right 10/19/2020   Procedure: INTERCOSTAL NERVE BLOCK;  Surgeon: [name], MD;  Location: [place];  Service: Thoracic;  Laterality: Right;   NODE DISSECTION Right 10/19/2020   Procedure: NODE DISSECTION;  Surgeon: [name], MD;  Location: [place];  Service: Thoracic;  Laterality: Right;   VIDEO BRONCHOSCOPY WITH ENDOBRONCHIAL NAVIGATION N/A 05/08/2020   Procedure: VIDEO BRONCHOSCOPY WITH ENDOBRONCHIAL NAVIGATION;  Surgeon:  [name], MD;  Location: [place];  Service: Thoracic;  Laterality: N/A;   VIDEO BRONCHOSCOPY WITH ENDOBRONCHIAL ULTRASOUND N/A 05/08/2020   Procedure: VIDEO BRONCHOSCOPY WITH ENDOBRONCHIAL ULTRASOUND;  Surgeon: [name], MD;  [facility];  Service: Thoracic;  Laterality: N/A;   Past Medical History:  Diagnosis Date   Cataract    COPD (chronic obstructive pulmonary disease) ([place])    NSCLC of upper lobe ([place]) 04/2020   BP 98/60   Pulse 84   Ht 5' 2.5" (1.588 m)   Wt 118 lb (53.5 kg)   SpO2 97%   BMI 21.24 kg/m   Opioid Risk Score:   Fall Risk Score:  `1  Depression screen PHQ 2/9     02/27/2022    1:28 PM 02/05/2018   11:15 AM 01/06/2018   10:31 AM 12/04/2017   11:12 AM 11/04/2017    2:07 PM 10/29/2017   10:34 AM 09/15/2017    2:37 PM  Depression screen PHQ 2/9  Decreased Interest 0 0 0 0 0 0 1  Down, Depressed, Hopeless 0 0 0 0 0 0 1  PHQ - 2 Score 0 0 0 0 0 0 2  Altered sleeping 0      2  Tired, decreased energy 0      1  Change in appetite 0      0  Feeling bad or failure about yourself  0      0  Trouble concentrating 0      1  Moving slowly or fidgety/restless 0      1  Suicidal thoughts 0      0  PHQ-9 Score 0      7  Difficult doing work/chores       Somewhat difficult     Review of Systems  All other systems reviewed and are negative.      Objective:   Physical Exam  General: Alert and oriented x 3, No apparent distress HEENT: Head is normocephalic, atraumatic, PERRLA, EOMI, sclera anicteric, oral mucosa pink and moist, dentition intact, ext ear canals clear,  Neck: Supple without JVD or lymphadenopathy Heart: Reg rate and rhythm. No murmurs rubs or gallops Chest: CTA bilaterally without wheezes, rales, or rhonchi; no distress Abdomen: Soft, non-tender, non-distended, bowel sounds positive. Extremities: No clubbing, cyanosis, or edema. Pulses are 2+ Psych: Pt's affect is appropriate. Pt is cooperative Skin: Clean and intact  without signs of breakdown Neuro:  Alert and oriented x 3. Normal insight and awareness. Intact Memory. Normal language and speech. Cranial nerve exam unremarkable, motor 4+/5. Sensory exam normal  for light touch and pain in all 4 limbs. No limb ataxia or cerebellar signs. No abnormal tone appreciated. Normal gait/tandem gait Musculoskeletal: Full ROM, No pain with AROM or PROM in the neck, trunk, or extremities. Posture appropriate         Assessment & Plan:  1. Functional deficits secondary to NSCLC with brain mets right occipital lobe             -pt is doing EXTREMELY well  -I gave her instructions to return to driving  -pt is doing a good job exercising but pacing self as well  -she does exercises to stimulate herself cognitively also             -I mentioned some topical lidocaine or over-the-counter remedy for her scalp sensitivity. 2.   Metastatic cancer to brain: s/p craniotomy 3. Lung cancer/COPD: per oncology    Fifteen minutes of face to face patient care time were spent during this visit. All questions were encouraged and answered.  Follow up with me as needed .

## 2022-03-07 ENCOUNTER — Telehealth: Payer: Self-pay | Admitting: Internal Medicine

## 2022-03-07 NOTE — Telephone Encounter (Signed)
Called patient regarding upcoming August appointment, patient is notified. 

## 2022-03-14 ENCOUNTER — Other Ambulatory Visit: Payer: Self-pay | Admitting: Internal Medicine

## 2022-04-29 ENCOUNTER — Other Ambulatory Visit: Payer: Self-pay

## 2022-04-29 ENCOUNTER — Ambulatory Visit (HOSPITAL_COMMUNITY)
Admission: RE | Admit: 2022-04-29 | Discharge: 2022-04-29 | Disposition: A | Discharge status: Home / Self Care | Payer: Medicare Other | Source: Ambulatory Visit | Attending: Internal Medicine | Admitting: Internal Medicine

## 2022-04-29 ENCOUNTER — Inpatient Hospital Stay: Payer: Medicare Other | Attending: Physician Assistant

## 2022-04-29 DIAGNOSIS — C771 Secondary and unspecified malignant neoplasm of intrathoracic lymph nodes: Secondary | ICD-10-CM | POA: Insufficient documentation

## 2022-04-29 DIAGNOSIS — C3490 Malignant neoplasm of unspecified part of unspecified bronchus or lung: Secondary | ICD-10-CM | POA: Diagnosis present

## 2022-04-29 DIAGNOSIS — C3411 Malignant neoplasm of upper lobe, right bronchus or lung: Secondary | ICD-10-CM | POA: Insufficient documentation

## 2022-04-29 DIAGNOSIS — C7931 Secondary malignant neoplasm of brain: Secondary | ICD-10-CM | POA: Insufficient documentation

## 2022-04-29 LAB — CBC WITH DIFFERENTIAL (CANCER CENTER ONLY)
Abs Immature Granulocytes: 0.01 10*3/uL (ref 0.00–0.07)
Basophils Absolute: 0 10*3/uL (ref 0.0–0.1)
Basophils Relative: 1 %
Eosinophils Absolute: 0.1 10*3/uL (ref 0.0–0.5)
Eosinophils Relative: 1 %
HCT: 36 % (ref 36.0–46.0)
Hemoglobin: 12.1 g/dL (ref 12.0–15.0)
Immature Granulocytes: 0 %
Lymphocytes Relative: 28 %
Lymphs Abs: 1.8 10*3/uL (ref 0.7–4.0)
MCH: 30.5 pg (ref 26.0–34.0)
MCHC: 33.6 g/dL (ref 30.0–36.0)
MCV: 90.7 fL (ref 80.0–100.0)
Monocytes Absolute: 0.5 10*3/uL (ref 0.1–1.0)
Monocytes Relative: 8 %
Neutro Abs: 3.9 10*3/uL (ref 1.7–7.7)
Neutrophils Relative %: 62 %
Platelet Count: 264 10*3/uL (ref 150–400)
RBC: 3.97 MIL/uL (ref 3.87–5.11)
RDW: 12.4 % (ref 11.5–15.5)
WBC Count: 6.3 10*3/uL (ref 4.0–10.5)
nRBC: 0 % (ref 0.0–0.2)

## 2022-04-29 LAB — CMP (CANCER CENTER ONLY)
ALT: 14 U/L (ref 0–44)
AST: 21 U/L (ref 15–41)
Albumin: 4.2 g/dL (ref 3.5–5.0)
Alkaline Phosphatase: 56 U/L (ref 38–126)
Anion gap: 8 (ref 5–15)
BUN: 9 mg/dL (ref 8–23)
CO2: 24 mmol/L (ref 22–32)
Calcium: 9.3 mg/dL (ref 8.9–10.3)
Chloride: 108 mmol/L (ref 98–111)
Creatinine: 0.48 mg/dL (ref 0.44–1.00)
GFR, Estimated: >60 mL/min (ref >60)
Glucose, Bld: 92 mg/dL (ref 70–99)
Potassium: 3.7 mmol/L (ref 3.5–5.1)
Sodium: 140 mmol/L (ref 135–145)
Total Bilirubin: <0.1 mg/dL — ABNORMAL LOW (ref 0.3–1.2)
Total Protein: 7 g/dL (ref 6.5–8.1)

## 2022-04-29 MED ORDER — IOHEXOL 300 MG/ML  SOLN
100.0000 mL | Freq: Once | INTRAMUSCULAR | Status: AC | PRN
  Administered 2022-04-29: 100 mL via INTRAVENOUS

## 2022-04-29 MED ORDER — SODIUM CHLORIDE (PF) 0.9 % IJ SOLN
INTRAMUSCULAR | Status: AC
  Filled 2022-04-29: qty 50

## 2022-05-01 ENCOUNTER — Other Ambulatory Visit: Payer: Self-pay

## 2022-05-01 ENCOUNTER — Inpatient Hospital Stay (HOSPITAL_BASED_OUTPATIENT_CLINIC_OR_DEPARTMENT_OTHER): Payer: Medicare Other | Admitting: Internal Medicine

## 2022-05-01 VITALS — BP 123/61 | HR 76 | Resp 18 | Wt 118.5 lb

## 2022-05-01 DIAGNOSIS — C3411 Malignant neoplasm of upper lobe, right bronchus or lung: Secondary | ICD-10-CM | POA: Diagnosis present

## 2022-05-01 DIAGNOSIS — C771 Secondary and unspecified malignant neoplasm of intrathoracic lymph nodes: Secondary | ICD-10-CM | POA: Diagnosis not present

## 2022-05-01 DIAGNOSIS — C7931 Secondary malignant neoplasm of brain: Secondary | ICD-10-CM | POA: Diagnosis not present

## 2022-05-01 DIAGNOSIS — C3490 Malignant neoplasm of unspecified part of unspecified bronchus or lung: Secondary | ICD-10-CM

## 2022-05-01 NOTE — Progress Notes (Signed)
[place] [phone]   [phone]  OFFICE PROGRESS NOTE  [name], [place] [address] [place] [address]  DIAGNOSIS: Metastatic non-small cell lung cancer initially diagnosed as stage IIIA (T1b, N2, M0) non-small cell lung cancer, adenocarcinoma presented with right upper lobe lung nodule as well as right upper paratracheal adenopathy and suspicious tiny nodule in the right middle lobe.  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  First dose 05/30/2020. Status post 3 cycles. 2) status post robotic assisted right thoracoscopy with right upper lobectomy and lymph node dissection under the care of Dr. Roxan Hockey on October 19, 2020.  There was residual tumor measuring 0.5 cm with lymph node metastasis involving 4R, 12 and hilar lymph nodes. 3) Adjuvant systemic chemotherapy with carboplatin for AUC of 5 and Alimta 500 Mg/M2 every 3 weeks.  First dose was given December 11, 2020.  Status post 4 cycles.  Last dose of chemotherapy was given Feb 13, 2021. 4) status post right posterior parietal craniotomy with resection of the brain mass under the care of Dr. Sherley Bounds followed [facility] to the solitary brain metastasis under the care of Dr. Tammi Klippel.  The final pathology was consistent with metastatic adenocarcinoma of lung primary. 5) Adjuvant immunotherapy with Atezolizumab 1200 Mg IV every 3 weeks.  First dose March 27, 2021.  Status post 11 cycles.  Last cycle was given on November 12, 2021 this treatment was discontinued secondary to disease progression in the brain.    CURRENT THERAPY: Observation  INTERVAL HISTORY: [name] 71 y.o. female returns to the clinic today for follow-up visit.  The patient is feeling fine today with no  concerning complaints.  She denied having any chest pain, shortness of breath, cough or hemoptysis.  She denied having any nausea, vomiting, diarrhea or constipation.  She has no headache or visual changes.  She has no recent weight loss or night sweats.  She has repeat CT scan of the chest, abdomen and pelvis performed recently and she is here for evaluation and discussion of her scan results.  MEDICAL HISTORY: Past Medical History:  Diagnosis Date   Cataract    COPD (chronic obstructive pulmonary disease) ([place])    NSCLC of upper lobe ([place]) 04/2020    ALLERGIES:  has No Known Allergies.  MEDICATIONS:  Current Outpatient Medications  Medication Sig Dispense Refill   acetaminophen (TYLENOL) 325 MG tablet Take 1-2 tablets (325-650 mg total) by mouth every 4 (four) hours as needed for mild pain.     albuterol (PROVENTIL HFA;VENTOLIN HFA) 108 (90 Base) MCG/ACT inhaler Inhale 2 puffs into the lungs every 4 (four) hours as needed for wheezing or shortness of breath (cough, shortness of breath or wheezing.). 1 Inhaler 1   alendronate (FOSAMAX) 70 MG tablet Take 70 mg by mouth once a week.     Cholecalciferol (VITAMIN D3 PO) Take 2,000 Units by mouth daily.     fluticasone (FLONASE) 50 MCG/ACT nasal spray Place 2 sprays into both nostrils daily. 16 g 12   Fluticasone-Salmeterol (ADVAIR) 250-50 MCG/DOSE AEPB Inhale 1 puff into the lungs 2 (two) times daily.      levothyroxine (SYNTHROID) 75 MCG tablet TAKE 1 TABLET BY MOUTH EVERY DAY BEFORE BREAKFAST 30 tablet 2  Multiple Vitamin (MULTIVITAMIN) tablet Take 1 tablet by mouth daily.     No current facility-administered medications for this visit.    SURGICAL HISTORY:  Past Surgical History:  Procedure Laterality Date   APPENDECTOMY  9628   APPLICATION OF CRANIAL NAVIGATION Right 01/11/2022   Procedure: APPLICATION OF CRANIAL NAVIGATION;  Surgeon: Eustace Moore, MD;  Location: [place];  Service: Neurosurgery;  Laterality: Right;   COLON SURGERY   2008   colostomy-infection post op appendectomy   COLOSTOMY REVERSAL  2009   CRANIOTOMY Right 01/11/2022   Procedure: RIGHT CRANIOTOMY FOR TUMOR RESECTION WITH BRAIN LAB;  Surgeon: Eustace Moore, MD;  Location: [place];  Service: Neurosurgery;  Laterality: Right;   INTERCOSTAL NERVE BLOCK Right 10/19/2020   Procedure: INTERCOSTAL NERVE BLOCK;  Surgeon: [name], MD;  Location: [place];  Service: Thoracic;  Laterality: Right;   NODE DISSECTION Right 10/19/2020   Procedure: NODE DISSECTION;  Surgeon: [name], MD;  Location: [place];  Service: Thoracic;  Laterality: Right;   VIDEO BRONCHOSCOPY WITH ENDOBRONCHIAL NAVIGATION N/A 05/08/2020   Procedure: VIDEO BRONCHOSCOPY WITH ENDOBRONCHIAL NAVIGATION;  Surgeon: [name], MD;  Location: [place];  Service: Thoracic;  Laterality: N/A;   VIDEO BRONCHOSCOPY WITH ENDOBRONCHIAL ULTRASOUND N/A 05/08/2020   Procedure: VIDEO BRONCHOSCOPY WITH ENDOBRONCHIAL ULTRASOUND;  Surgeon: [name], MD;  [facility];  Service: Thoracic;  Laterality: N/A;    REVIEW OF SYSTEMS:  A comprehensive review of systems was negative.   PHYSICAL EXAMINATION: General appearance: alert, cooperative, and no distress Head: Normocephalic, without obvious abnormality, atraumatic Neck: no adenopathy, no JVD, supple, symmetrical, trachea midline, and thyroid not enlarged, symmetric, no tenderness/mass/nodules Lymph nodes: Cervical, supraclavicular, and axillary nodes normal. Resp: clear to auscultation bilaterally Back: symmetric, no curvature. ROM normal. No CVA tenderness. Cardio: regular rate and rhythm, S1, S2 normal, no murmur, click, rub or gallop GI: soft, non-tender; bowel sounds normal; no masses,  no organomegaly Extremities: extremities normal, atraumatic, no cyanosis or edema  ECOG PERFORMANCE STATUS: 1 - Symptomatic but completely ambulatory  Blood pressure 123/61, pulse 76, resp. rate 18, weight 118 lb 8 oz (53.8 kg), SpO2 100  %.  LABORATORY DATA: Lab Results  Component Value Date   WBC 6.3 04/29/2022   HGB 12.1 04/29/2022   HCT 36.0 04/29/2022   MCV 90.7 04/29/2022   PLT 264 04/29/2022      Chemistry      Component Value Date/Time   NA 140 04/29/2022 1519   NA 135 10/29/2017 1131   K 3.7 04/29/2022 1519   CL 108 04/29/2022 1519   CO2 24 04/29/2022 1519   BUN 9 04/29/2022 1519   BUN 5 (L) 10/29/2017 1131   CREATININE 0.48 04/29/2022 1519      Component Value Date/Time   CALCIUM 9.3 04/29/2022 1519   ALKPHOS 56 04/29/2022 1519   AST 21 04/29/2022 1519   ALT 14 04/29/2022 1519   BILITOT <0.1 (L) 04/29/2022 1519       RADIOGRAPHIC STUDIES: CT Chest W Contrast  Result Date: 04/30/2022 CLINICAL DATA:  Primary Cancer Type: Lung Imaging Indication: Routine surveillance Interval therapy since last imaging? No Initial Cancer Diagnosis Date: 05/08/2020; Established by: Biopsy-proven Detailed Pathology: Stage IIIA non-small cell lung cancer, adenocarcinoma. Primary Tumor location:  Right upper lobe. Surgeries: Right upper lobectomy 10/19/2020. Resection of brain mass 01/11/2022. Appendectomy. Colon surgery 2008. Chemotherapy: Yes; Ongoing? No; Most recent administration: 02/13/2021 Immunotherapy?  Yes; Type: Atezolizumab; Ongoing? No Radiation therapy? Yes;  Date Range: 01/03/2022 - 01/09/2022; Target: Brain * Tracking Code: BO * EXAM: CT CHEST, ABDOMEN, AND PELVIS WITH CONTRAST TECHNIQUE: Multidetector CT imaging of the chest, abdomen and pelvis was performed following the standard protocol during bolus administration of intravenous contrast. RADIATION DOSE REDUCTION: This exam was performed according to the departmental dose-optimization program which includes automated exposure control, adjustment of the mA and/or kV according to patient size and/or use of iterative reconstruction technique. CONTRAST:  19m OMNIPAQUE IOHEXOL 300 MG/ML  SOLN COMPARISON:  Most recent CT chest, abdomen and pelvis 01/01/2022.  04/06/2020 PET-CT. FINDINGS: CT CHEST FINDINGS Cardiovascular: Aortic atherosclerosis. Normal heart size, without pericardial effusion. Three vessel coronary artery calcification. No central pulmonary embolism, on this non-dedicated study. Mediastinum/Nodes: No mediastinal or hilar adenopathy. Lungs/Pleura: No pleural fluid. Right upper lobectomy. Mild centrilobular emphysema. Subtle interstitial thickening and nodularity within the peribronchovascular superior segment left lower lobe including on images 62 and 63 of series 6, new. Also identified on images eighty seven through ninety five of series four coronal. Musculoskeletal: Nonunited right posterior eleventh rib fracture again identified. Anterolateral left fourth and fifth rib sclerosis is new and likely due to interval healed fractures. CT ABDOMEN PELVIS FINDINGS Hepatobiliary: Inferior right hepatic lobe subcentimeter low-density lesion is unchanged and likely a cyst. No suspicious liver lesion. Multiple gallstones without acute cholecystitis or biliary duct dilatation. Pancreas: Normal, without mass or ductal dilatation. Spleen: Normal in size, without focal abnormality. Adrenals/Urinary Tract: Normal adrenal glands. Left renal sinus cysts. Interpolar left renal too small to characterize lesion. Normal right kidney. No hydronephrosis. Normal urinary bladder. Stomach/Bowel: Normal stomach, without wall thickening. Scattered colonic diverticula. Normal terminal ileum. Appendectomy. Normal small bowel. Vascular/Lymphatic: Aortic atherosclerosis. No abdominopelvic adenopathy. Reproductive: Retroverted uterus.  No adnexal mass. Other: No significant free fluid. No evidence of omental or peritoneal disease. Paramidline right small fat containing ventral abdominal wall hernias unchanged. Musculoskeletal: Mild S shaped thoracolumbar spine curvature with degenerate disc disease at the lumbosacral junction. IMPRESSION: 1. Right upper lobectomy, without typical  findings of recurrent or metastatic disease. 2. Superior segment left lower lobe subtle peribronchovascular nodularity is new and favored to be due to interval infection of indeterminate acuity. Recommend attention on follow-up. 3. Cholelithiasis 4. Incidental findings, including: Aortic atherosclerosis (ICD10-I70.0), coronary artery atherosclerosis and emphysema (ICD10-J43.9). Electronically Signed   By: KAbigail MiyamotoM.D.   On: 04/30/2022 11:40   CT Abdomen Pelvis W Contrast  Result Date: 04/30/2022 CLINICAL DATA:  Primary Cancer Type: Lung Imaging Indication: Routine surveillance Interval therapy since last imaging? No Initial Cancer Diagnosis Date: 05/08/2020; Established by: Biopsy-proven Detailed Pathology: Stage IIIA non-small cell lung cancer, adenocarcinoma. Primary Tumor location:  Right upper lobe. Surgeries: Right upper lobectomy 10/19/2020. Resection of brain mass 01/11/2022. Appendectomy. Colon surgery 2008. Chemotherapy: Yes; Ongoing? No; Most recent administration: 02/13/2021 Immunotherapy?  Yes; Type: Atezolizumab; Ongoing? No Radiation therapy? Yes; Date Range: 01/03/2022 - 01/09/2022; Target: Brain * Tracking Code: BO * EXAM: CT CHEST, ABDOMEN, AND PELVIS WITH CONTRAST TECHNIQUE: Multidetector CT imaging of the chest, abdomen and pelvis was performed following the standard protocol during bolus administration of intravenous contrast. RADIATION DOSE REDUCTION: This exam was performed according to the departmental dose-optimization program which includes automated exposure control, adjustment of the mA and/or kV according to patient size and/or use of iterative reconstruction technique. CONTRAST:  1033mOMNIPAQUE IOHEXOL 300 MG/ML  SOLN COMPARISON:  Most recent CT chest, abdomen and pelvis 01/01/2022. 04/06/2020 PET-CT. FINDINGS: CT CHEST FINDINGS Cardiovascular: Aortic atherosclerosis. Normal heart size, without pericardial effusion.  Three vessel coronary artery calcification. No central  pulmonary embolism, on this non-dedicated study. Mediastinum/Nodes: No mediastinal or hilar adenopathy. Lungs/Pleura: No pleural fluid. Right upper lobectomy. Mild centrilobular emphysema. Subtle interstitial thickening and nodularity within the peribronchovascular superior segment left lower lobe including on images 62 and 63 of series 6, new. Also identified on images eighty seven through ninety five of series four coronal. Musculoskeletal: Nonunited right posterior eleventh rib fracture again identified. Anterolateral left fourth and fifth rib sclerosis is new and likely due to interval healed fractures. CT ABDOMEN PELVIS FINDINGS Hepatobiliary: Inferior right hepatic lobe subcentimeter low-density lesion is unchanged and likely a cyst. No suspicious liver lesion. Multiple gallstones without acute cholecystitis or biliary duct dilatation. Pancreas: Normal, without mass or ductal dilatation. Spleen: Normal in size, without focal abnormality. Adrenals/Urinary Tract: Normal adrenal glands. Left renal sinus cysts. Interpolar left renal too small to characterize lesion. Normal right kidney. No hydronephrosis. Normal urinary bladder. Stomach/Bowel: Normal stomach, without wall thickening. Scattered colonic diverticula. Normal terminal ileum. Appendectomy. Normal small bowel. Vascular/Lymphatic: Aortic atherosclerosis. No abdominopelvic adenopathy. Reproductive: Retroverted uterus.  No adnexal mass. Other: No significant free fluid. No evidence of omental or peritoneal disease. Paramidline right small fat containing ventral abdominal wall hernias unchanged. Musculoskeletal: Mild S shaped thoracolumbar spine curvature with degenerate disc disease at the lumbosacral junction. IMPRESSION: 1. Right upper lobectomy, without typical findings of recurrent or metastatic disease. 2. Superior segment left lower lobe subtle peribronchovascular nodularity is new and favored to be due to interval infection of indeterminate acuity.  Recommend attention on follow-up. 3. Cholelithiasis 4. Incidental findings, including: Aortic atherosclerosis (ICD10-I70.0), coronary artery atherosclerosis and emphysema (ICD10-J43.9). Electronically Signed   By: Abigail Miyamoto M.D.   On: 04/30/2022 11:40    ASSESSMENT AND PLAN: This is a very pleasant 71 years old white female with metastatic non-small cell lung cancer, adenocarcinoma that was initially diagnosed as stage IIIa (T1b, N2, M0) non-small cell lung cancer, adenocarcinoma presented with right upper lobe lung nodule in addition to mediastinal lymphadenopathy.  PD-L1 expression 90%.  The patient has evidence for metastatic disease with solitary brain metastasis in April 2023 The patient underwent a course of neoadjuvant treatment with carboplatin for AUC of 5, Alimta 500 mg/M2 and Keytruda 200 mg IV every 3 weeks for 3 cycles.   She tolerated this treatment well with no concerning adverse effect except for dry skin and itching. Her repeat CT scan after the neoadjuvant treatment showed partial response with around 50% reduction in the tumor volume. The patient underwent right upper lobectomy with lymph node dissection on October 19, 2020 and the final pathology showed residual tumor measuring 0.5 cm with lymph node metastasis involving hilar and mediastinal lymph nodes. She underwent adjuvant systemic chemotherapy with carboplatin for AUC of 5, Alimta 500 mg/M2 every 3 weeks since she has good response to this treatment in the past.  She is status post 4 cycles.  She tolerated this treatment well with no concerning adverse effect except for mild fatigue. She underwent adjuvant immunotherapy with Tecentriq 1200 Mg IV every 3 weeks status post 12 cycles.  Last dose was given on November 12, 2021 discontinued after the patient develop metastatic brain lesions and she has been off treatment since her diagnosis with the brain metastasis.. The patient underwent craniotomy with resection of the solitary  brain metastasis followed by SRS to the surgical cavity. The patient is currently on observation and she is feeling fine with no concerning complaints. She had repeat CT scan of  the chest, abdomen and pelvis performed recently.  I personally and independently reviewed the scans and discussed the result with the patient today. Her scan showed no concerning findings for disease recurrence or metastasis but there was superior segment left lower lobe peribronchovascular nodularity likely infectious in origin and will continue to monitor it closely. I recommended for the patient to continue on observation with repeat CT scan of the chest, abdomen and pelvis in 3 months. The patient was advised to call immediately if she has any other concerning symptoms in the interval. The patient voices understanding of current disease status and treatment options and is in agreement with the current care plan.  All questions were answered. The patient knows to call the clinic with any problems, questions or concerns. We can certainly see the patient much sooner if necessary. The total time spent in the appointment was 20 minutes.   Disclaimer: This note was dictated with voice recognition software. Similar sounding words can inadvertently be transcribed and may not be corrected upon review.

## 2022-05-02 ENCOUNTER — Telehealth: Payer: Self-pay | Admitting: Internal Medicine

## 2022-05-02 NOTE — Telephone Encounter (Signed)
Contacted patient to scheduled appointments. Patient is aware of appointments that are scheduled.   

## 2022-05-03 ENCOUNTER — Ambulatory Visit
Admission: RE | Admit: 2022-05-03 | Discharge: 2022-05-03 | Disposition: A | Discharge status: Home / Self Care | Payer: Medicare Other | Source: Ambulatory Visit | Attending: Radiation Oncology | Admitting: Radiation Oncology

## 2022-05-03 DIAGNOSIS — C7931 Secondary malignant neoplasm of brain: Secondary | ICD-10-CM

## 2022-05-03 MED ORDER — GADOBENATE DIMEGLUMINE 529 MG/ML IV SOLN
10.0000 mL | Freq: Once | INTRAVENOUS | Status: AC | PRN
  Administered 2022-05-03: 10 mL via INTRAVENOUS

## 2022-05-03 MED ORDER — GADOBENATE DIMEGLUMINE 529 MG/ML IV SOLN: 10.0000 mL | Freq: Once | INTRAVENOUS | Status: DC | PRN

## 2022-05-06 ENCOUNTER — Inpatient Hospital Stay: Payer: Medicare Other

## 2022-05-07 ENCOUNTER — Encounter: Payer: Self-pay | Admitting: Urology

## 2022-05-07 NOTE — Progress Notes (Signed)
Telephone appointment. I verified patients identity and began nursing interview. Patient doing well. No issues reported at this time.  Meaningful use complete.  Reminded patient of her 9:30am-05/08/22 telephone appointment w/ Ashlyn Bruning PA-C. I left my extension [phone] in case patient needs anything. Patient verbalized understanding.  Patient contact [phone]

## 2022-05-08 ENCOUNTER — Other Ambulatory Visit: Payer: Self-pay | Admitting: Radiation Therapy

## 2022-05-08 ENCOUNTER — Ambulatory Visit
Admission: RE | Admit: 2022-05-08 | Discharge: 2022-05-08 | Disposition: A | Discharge status: Home / Self Care | Payer: Medicare Other | Source: Ambulatory Visit | Attending: Urology | Admitting: Urology

## 2022-05-08 DIAGNOSIS — C7931 Secondary malignant neoplasm of brain: Secondary | ICD-10-CM

## 2022-05-08 NOTE — Progress Notes (Signed)
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    Narrative:  I spoke with the patient to conduct her routine scheduled 3 month follow up visit to review her recent MRI brain results via telephone to spare the patient unnecessary potential exposure in the healthcare setting during the current COVID-19 pandemic.  The patient was notified in advance and gave permission to proceed with this visit format.  She tolerated her pre-op radiation treatment well and subsequently underwent surgical resection under the care of Dr. Ronnald Ramp on 01/11/2022 and has recovered well from her surgery.  She had a recent follow-up MRI brain scan on 05/03/2022 showing evolution of postoperative changes since her prior study but no evidence of residual or recurrent disease.                    On review of systems, the patient states that she is doing well in general.  She specifically denies headaches, nausea, vomiting, dizziness/imbalance or any changes in her visual or auditory acuity.  Her hair continues to gradually fill in within the treatment field where it had thinned significantly.  She remains off steroids without any rebound effect or difficulties and continues making progress in regards to her strength and activity level.  She is off systemic therapy and being followed in observation only with Dr. Julien Nordmann. Her most recent restaging CT C/A/P from 04/29/22 appears stable without any definite  evidence of disease recurrence or progression.  There was some subtle peribronchovascular nodularity in the superior segment of the left lower lobe lung that is new but favored to be a result of an interval infection of indeterminate acuity.  She will have a repeat CT chest scan in 3 months to follow-up and confirm stability.  Overall, she is quite pleased with her progress to date. She had a recent posttreatment MRI brain scan on 05/02/2022 which is without any evidence of disease recurrence or new disease.  We reviewed these results today.  ALLERGIES:  has No Known Allergies.  Meds: Current Outpatient Medications  Medication Sig Dispense Refill   acetaminophen (TYLENOL) 325 MG tablet Take 1-2 tablets (325-650 mg total) by mouth every 4 (four) hours as needed for mild pain.     albuterol (PROVENTIL HFA;VENTOLIN HFA) 108 (90 Base) MCG/ACT inhaler Inhale 2 puffs into the lungs every 4 (four) hours as needed for wheezing or shortness of breath (cough, shortness of breath or wheezing.). 1 Inhaler 1   alendronate (FOSAMAX) 70 MG tablet Take 70 mg by mouth once a week.     Cholecalciferol (VITAMIN D3 PO) Take 2,000 Units by mouth daily.     fluticasone (FLONASE) 50 MCG/ACT nasal spray Place 2 sprays into both nostrils daily. 16 g 12   Fluticasone-Salmeterol (ADVAIR) 250-50 MCG/DOSE AEPB Inhale 1 puff into the lungs 2 (two) times daily.      levothyroxine (SYNTHROID) 75 MCG tablet TAKE 1 TABLET BY MOUTH EVERY DAY  BEFORE BREAKFAST 30 tablet 2   Multiple Vitamin (MULTIVITAMIN) tablet Take 1 tablet by mouth daily.     No current facility-administered medications for this encounter.    Physical Findings:  vitals were not taken for this visit.  Pain Assessment Pain Score: 0-No pain/10 Unable to assess due to telephone follow-up visit format.  Lab Findings: Lab Results  Component Value Date   WBC 6.3 04/29/2022   HGB 12.1 04/29/2022   HCT 36.0 04/29/2022   MCV 90.7 04/29/2022   PLT 264 04/29/2022      Radiographic Findings: MR Brain W Wo Contrast  Result Date: 05/06/2022 CLINICAL DATA:  Brain metastases, assess treatment response 3T SRS Protocol EXAM: MRI HEAD WITHOUT AND WITH CONTRAST TECHNIQUE: Multiplanar, multiecho pulse sequences of the brain and surrounding structures were obtained without and with intravenous contrast. CONTRAST:  75m MULTIHANCE GADOBENATE DIMEGLUMINE 529 MG/ML IV SOLN COMPARISON:  01/30/2022 FINDINGS: Brain: Evolution of postoperative changes in the right occipital lobe with smaller hemosiderin lined resection cavity. Thin extra-axial collection underlying the craniotomy has also decreased in size. Some intrinsic T1 shortening remains, but there is no apparent nodular enhancement to suggest residual or recurrent disease. Surrounding T2 hyperintensity has decreased. No new mass or abnormal enhancement. No acute infarction. Patchy foci of T2 hyperintensity in the supratentorial white matter likely reflect nonspecific gliosis/demyelination. Small bilateral chronic cerebellar infarcts. Vascular: Major vessel flow voids at the skull base are preserved. Skull and upper cervical spine: Craniotomy. Normal marrow signal is preserved. Sinuses/Orbits: Minor mucosal thickening. No significant orbital abnormality. Other: Sella is unremarkable.  Mastoid air cells are clear. IMPRESSION: Evolution of postoperative changes since the prior study. No evidence of residual/recurrent disease. Electronically Signed   By: PMacy MisM.D.   On: 05/06/2022 16:20   CT Chest W Contrast  Result Date: 04/30/2022 CLINICAL DATA:  Primary Cancer Type: Lung Imaging Indication: Routine surveillance Interval therapy since last imaging? No Initial Cancer Diagnosis Date: 05/08/2020; Established by: Biopsy-proven Detailed Pathology: Stage IIIA non-small cell lung cancer, adenocarcinoma. Primary Tumor location:  Right upper lobe. Surgeries: Right upper lobectomy 10/19/2020. Resection of brain mass 01/11/2022.  Appendectomy. Colon surgery 2008. Chemotherapy: Yes; Ongoing? No; Most recent administration: 02/13/2021 Immunotherapy?  Yes; Type: Atezolizumab; Ongoing? No Radiation therapy? Yes; Date Range: 01/03/2022 - 01/09/2022; Target: Brain * Tracking Code: BO * EXAM: CT CHEST, ABDOMEN, AND PELVIS WITH CONTRAST TECHNIQUE: Multidetector CT imaging of the chest, abdomen and pelvis was performed following the standard protocol during bolus administration of intravenous contrast. RADIATION DOSE REDUCTION: This exam was performed according to the departmental dose-optimization program which includes automated exposure control, adjustment of the mA and/or kV according to patient size and/or use of iterative reconstruction technique. CONTRAST:  1046mOMNIPAQUE IOHEXOL 300 MG/ML  SOLN COMPARISON:  Most recent CT chest, abdomen and pelvis 01/01/2022. 04/06/2020 PET-CT. FINDINGS: CT CHEST FINDINGS Cardiovascular: Aortic atherosclerosis. Normal heart size, without pericardial effusion. Three vessel coronary artery calcification. No central pulmonary embolism, on this non-dedicated study. Mediastinum/Nodes: No mediastinal or hilar adenopathy. Lungs/Pleura: No pleural fluid. Right upper lobectomy. Mild centrilobular emphysema. Subtle interstitial thickening and nodularity within the peribronchovascular superior segment left lower lobe including on images 62 and 63 of series 6, new. Also identified on images eighty seven through ninety five of series four coronal. Musculoskeletal: Nonunited right posterior eleventh rib fracture again identified. Anterolateral left fourth and fifth rib sclerosis is new and likely due to interval healed fractures. CT ABDOMEN PELVIS FINDINGS Hepatobiliary: Inferior right hepatic lobe subcentimeter low-density lesion is  unchanged and likely a cyst. No suspicious liver lesion. Multiple gallstones without acute cholecystitis or biliary duct dilatation. Pancreas: Normal, without mass or ductal dilatation.  Spleen: Normal in size, without focal abnormality. Adrenals/Urinary Tract: Normal adrenal glands. Left renal sinus cysts. Interpolar left renal too small to characterize lesion. Normal right kidney. No hydronephrosis. Normal urinary bladder. Stomach/Bowel: Normal stomach, without wall thickening. Scattered colonic diverticula. Normal terminal ileum. Appendectomy. Normal small bowel. Vascular/Lymphatic: Aortic atherosclerosis. No abdominopelvic adenopathy. Reproductive: Retroverted uterus.  No adnexal mass. Other: No significant free fluid. No evidence of omental or peritoneal disease. Paramidline right small fat containing ventral abdominal wall hernias unchanged. Musculoskeletal: Mild S shaped thoracolumbar spine curvature with degenerate disc disease at the lumbosacral junction. IMPRESSION: 1. Right upper lobectomy, without typical findings of recurrent or metastatic disease. 2. Superior segment left lower lobe subtle peribronchovascular nodularity is new and favored to be due to interval infection of indeterminate acuity. Recommend attention on follow-up. 3. Cholelithiasis 4. Incidental findings, including: Aortic atherosclerosis (ICD10-I70.0), coronary artery atherosclerosis and emphysema (ICD10-J43.9). Electronically Signed   By: Abigail Miyamoto M.D.   On: 04/30/2022 11:40   CT Abdomen Pelvis W Contrast  Result Date: 04/30/2022 CLINICAL DATA:  Primary Cancer Type: Lung Imaging Indication: Routine surveillance Interval therapy since last imaging? No Initial Cancer Diagnosis Date: 05/08/2020; Established by: Biopsy-proven Detailed Pathology: Stage IIIA non-small cell lung cancer, adenocarcinoma. Primary Tumor location:  Right upper lobe. Surgeries: Right upper lobectomy 10/19/2020. Resection of brain mass 01/11/2022. Appendectomy. Colon surgery 2008. Chemotherapy: Yes; Ongoing? No; Most recent administration: 02/13/2021 Immunotherapy?  Yes; Type: Atezolizumab; Ongoing? No Radiation therapy? Yes; Date Range:  01/03/2022 - 01/09/2022; Target: Brain * Tracking Code: BO * EXAM: CT CHEST, ABDOMEN, AND PELVIS WITH CONTRAST TECHNIQUE: Multidetector CT imaging of the chest, abdomen and pelvis was performed following the standard protocol during bolus administration of intravenous contrast. RADIATION DOSE REDUCTION: This exam was performed according to the departmental dose-optimization program which includes automated exposure control, adjustment of the mA and/or kV according to patient size and/or use of iterative reconstruction technique. CONTRAST:  142m OMNIPAQUE IOHEXOL 300 MG/ML  SOLN COMPARISON:  Most recent CT chest, abdomen and pelvis 01/01/2022. 04/06/2020 PET-CT. FINDINGS: CT CHEST FINDINGS Cardiovascular: Aortic atherosclerosis. Normal heart size, without pericardial effusion. Three vessel coronary artery calcification. No central pulmonary embolism, on this non-dedicated study. Mediastinum/Nodes: No mediastinal or hilar adenopathy. Lungs/Pleura: No pleural fluid. Right upper lobectomy. Mild centrilobular emphysema. Subtle interstitial thickening and nodularity within the peribronchovascular superior segment left lower lobe including on images 62 and 63 of series 6, new. Also identified on images eighty seven through ninety five of series four coronal. Musculoskeletal: Nonunited right posterior eleventh rib fracture again identified. Anterolateral left fourth and fifth rib sclerosis is new and likely due to interval healed fractures. CT ABDOMEN PELVIS FINDINGS Hepatobiliary: Inferior right hepatic lobe subcentimeter low-density lesion is unchanged and likely a cyst. No suspicious liver lesion. Multiple gallstones without acute cholecystitis or biliary duct dilatation. Pancreas: Normal, without mass or ductal dilatation. Spleen: Normal in size, without focal abnormality. Adrenals/Urinary Tract: Normal adrenal glands. Left renal sinus cysts. Interpolar left renal too small to characterize lesion. Normal right kidney.  No hydronephrosis. Normal urinary bladder. Stomach/Bowel: Normal stomach, without wall thickening. Scattered colonic diverticula. Normal terminal ileum. Appendectomy. Normal small bowel. Vascular/Lymphatic: Aortic atherosclerosis. No abdominopelvic adenopathy. Reproductive: Retroverted uterus.  No adnexal mass. Other: No significant free fluid. No evidence of omental or peritoneal disease. Paramidline right small fat containing ventral abdominal wall hernias unchanged. Musculoskeletal:  Mild S shaped thoracolumbar spine curvature with degenerate disc disease at the lumbosacral junction. IMPRESSION: 1. Right upper lobectomy, without typical findings of recurrent or metastatic disease. 2. Superior segment left lower lobe subtle peribronchovascular nodularity is new and favored to be due to interval infection of indeterminate acuity. Recommend attention on follow-up. 3. Cholelithiasis 4. Incidental findings, including: Aortic atherosclerosis (ICD10-I70.0), coronary artery atherosclerosis and emphysema (ICD10-J43.9). Electronically Signed   By: Abigail Miyamoto M.D.   On: 04/30/2022 11:40    Impression/Plan: 1. 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 She appears to have recovered well from the effects of her preop SRS treatment and remains without complaints.  She saw Dr. Julien Nordmann on 05/01/2022 and the current plan is to continue on observation only with  a repeat CT chest scan in November 2023, prior to her next scheduled follow-up.  We reviewed the results of her recent posttreatment MRI brain scan from 05/03/2022 which is without any evidence of disease recurrence or new disease.  Therefore, we will continue with serial MRI brain scans every 3 months to monitor for any evidence of disease progression or recurrence and I will contact her by phone following each scan to review the results  and any recommendations from the multidisciplinary brain conference.  She appears to have a good understanding of her disease and our recommendations and is comfortable and in agreement with the stated plan.  She knows that she is welcome to call at anytime in the interim with any questions or concerns.   I personally spent 20 minutes in this encounter including chart review, reviewing radiological studies, telephone conversation with the patient, entering orders and completing documentation.     Nicholos Johns, PA-C

## 2022-06-11 ENCOUNTER — Other Ambulatory Visit: Payer: Self-pay | Admitting: Internal Medicine

## 2022-07-30 ENCOUNTER — Inpatient Hospital Stay: Payer: Medicare Other | Attending: Physician Assistant

## 2022-07-30 ENCOUNTER — Ambulatory Visit (HOSPITAL_COMMUNITY)
Admission: RE | Admit: 2022-07-30 | Discharge: 2022-07-30 | Disposition: A | Discharge status: Home / Self Care | Payer: Medicare Other | Source: Ambulatory Visit | Attending: Internal Medicine | Admitting: Internal Medicine

## 2022-07-30 ENCOUNTER — Encounter (HOSPITAL_COMMUNITY): Payer: Self-pay

## 2022-07-30 DIAGNOSIS — C3490 Malignant neoplasm of unspecified part of unspecified bronchus or lung: Secondary | ICD-10-CM | POA: Diagnosis present

## 2022-07-30 DIAGNOSIS — C3411 Malignant neoplasm of upper lobe, right bronchus or lung: Secondary | ICD-10-CM | POA: Insufficient documentation

## 2022-07-30 DIAGNOSIS — C7931 Secondary malignant neoplasm of brain: Secondary | ICD-10-CM | POA: Insufficient documentation

## 2022-07-30 DIAGNOSIS — C771 Secondary and unspecified malignant neoplasm of intrathoracic lymph nodes: Secondary | ICD-10-CM | POA: Insufficient documentation

## 2022-07-30 LAB — CBC WITH DIFFERENTIAL (CANCER CENTER ONLY)
Abs Immature Granulocytes: 0.03 10*3/uL (ref 0.00–0.07)
Basophils Absolute: 0.1 10*3/uL (ref 0.0–0.1)
Basophils Relative: 1 %
Eosinophils Absolute: 0 10*3/uL (ref 0.0–0.5)
Eosinophils Relative: 1 %
HCT: 38.3 % (ref 36.0–46.0)
Hemoglobin: 13 g/dL (ref 12.0–15.0)
Immature Granulocytes: 0 %
Lymphocytes Relative: 23 %
Lymphs Abs: 1.8 10*3/uL (ref 0.7–4.0)
MCH: 31.2 pg (ref 26.0–34.0)
MCHC: 33.9 g/dL (ref 30.0–36.0)
MCV: 91.8 fL (ref 80.0–100.0)
Monocytes Absolute: 0.5 10*3/uL (ref 0.1–1.0)
Monocytes Relative: 7 %
Neutro Abs: 5.3 10*3/uL (ref 1.7–7.7)
Neutrophils Relative %: 68 %
Platelet Count: 257 10*3/uL (ref 150–400)
RBC: 4.17 MIL/uL (ref 3.87–5.11)
RDW: 12.6 % (ref 11.5–15.5)
WBC Count: 7.8 10*3/uL (ref 4.0–10.5)
nRBC: 0 % (ref 0.0–0.2)

## 2022-07-30 LAB — CMP (CANCER CENTER ONLY)
ALT: 11 U/L (ref 0–44)
AST: 17 U/L (ref 15–41)
Albumin: 4.6 g/dL (ref 3.5–5.0)
Alkaline Phosphatase: 55 U/L (ref 38–126)
Anion gap: 7 (ref 5–15)
BUN: 11 mg/dL (ref 8–23)
CO2: 27 mmol/L (ref 22–32)
Calcium: 9.3 mg/dL (ref 8.9–10.3)
Chloride: 108 mmol/L (ref 98–111)
Creatinine: 0.53 mg/dL (ref 0.44–1.00)
GFR, Estimated: >60 mL/min (ref >60)
Glucose, Bld: 95 mg/dL (ref 70–99)
Potassium: 3.9 mmol/L (ref 3.5–5.1)
Sodium: 142 mmol/L (ref 135–145)
Total Bilirubin: 0.5 mg/dL (ref 0.3–1.2)
Total Protein: 7.2 g/dL (ref 6.5–8.1)

## 2022-07-30 MED ORDER — IOHEXOL 300 MG/ML  SOLN
100.0000 mL | Freq: Once | INTRAMUSCULAR | Status: AC | PRN
  Administered 2022-07-30: 100 mL via INTRAVENOUS

## 2022-07-30 MED ORDER — SODIUM CHLORIDE (PF) 0.9 % IJ SOLN
INTRAMUSCULAR | Status: AC
  Filled 2022-07-30: qty 50

## 2022-08-01 ENCOUNTER — Inpatient Hospital Stay (HOSPITAL_BASED_OUTPATIENT_CLINIC_OR_DEPARTMENT_OTHER): Payer: Medicare Other | Admitting: Internal Medicine

## 2022-08-01 VITALS — BP 126/57 | HR 74 | Temp 99.1°F | Resp 15 | Wt 122.0 lb

## 2022-08-01 DIAGNOSIS — C771 Secondary and unspecified malignant neoplasm of intrathoracic lymph nodes: Secondary | ICD-10-CM | POA: Diagnosis not present

## 2022-08-01 DIAGNOSIS — C3490 Malignant neoplasm of unspecified part of unspecified bronchus or lung: Secondary | ICD-10-CM | POA: Diagnosis not present

## 2022-08-01 DIAGNOSIS — C7931 Secondary malignant neoplasm of brain: Secondary | ICD-10-CM | POA: Diagnosis not present

## 2022-08-01 DIAGNOSIS — C3411 Malignant neoplasm of upper lobe, right bronchus or lung: Secondary | ICD-10-CM | POA: Diagnosis present

## 2022-08-01 NOTE — Progress Notes (Signed)
[place] [phone]   [phone]  OFFICE PROGRESS NOTE  [name], [place] [address] [place] [id number]  DIAGNOSIS: Metastatic non-small cell lung cancer initially diagnosed as stage IIIA (T1b, N2, M0) non-small cell lung cancer, adenocarcinoma presented with right upper lobe lung nodule as well as right upper paratracheal adenopathy and suspicious tiny nodule in the right middle lobe.  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  First dose 05/30/2020. Status post 3 cycles. 2) status post robotic assisted right thoracoscopy with right upper lobectomy and lymph node dissection under the care of Dr. Roxan Hockey on October 19, 2020.  There was residual tumor measuring 0.5 cm with lymph node metastasis involving 4R, 12 and hilar lymph nodes. 3) Adjuvant systemic chemotherapy with carboplatin for AUC of 5 and Alimta 500 Mg/M2 every 3 weeks.  First dose was given December 11, 2020.  Status post 4 cycles.  Last dose of chemotherapy was given Feb 13, 2021. 4) status post right posterior parietal craniotomy with resection of the brain mass under the care of Dr. Sherley Bounds followed by [facility] to the solitary brain metastasis under the care of Dr. Tammi Klippel.  The final pathology was consistent with metastatic adenocarcinoma of lung primary. 5) Adjuvant immunotherapy with Atezolizumab 1200 Mg IV every 3 weeks.  First dose March 27, 2021.  Status post 11 cycles.  Last cycle was given on November 12, 2021 this treatment was discontinued secondary to disease progression in the brain.    CURRENT THERAPY: Observation  INTERVAL HISTORY: [name] 71 y.o. female returns to the clinic today for follow-up visit.  The patient is feeling fine today with no  concerning complaints.  She has no chest pain, shortness of breath, cough or hemoptysis.  She has no nausea, vomiting, diarrhea or constipation.  She has no headache or visual changes.  She denied having any recent weight loss or night sweats.  She is currently on observation.  She is here today for evaluation with repeat CT scan of the chest, abdomen and pelvis for restaging of her disease.  MEDICAL HISTORY: Past Medical History:  Diagnosis Date   Cataract    COPD (chronic obstructive pulmonary disease) ([place])    NSCLC of upper lobe ([place]) 04/2020    ALLERGIES:  has No Known Allergies.  MEDICATIONS:  Current Outpatient Medications  Medication Sig Dispense Refill   acetaminophen (TYLENOL) 325 MG tablet Take 1-2 tablets (325-650 mg total) by mouth every 4 (four) hours as needed for mild pain.     albuterol (PROVENTIL HFA;VENTOLIN HFA) 108 (90 Base) MCG/ACT inhaler Inhale 2 puffs into the lungs every 4 (four) hours as needed for wheezing or shortness of breath (cough, shortness of breath or wheezing.). 1 Inhaler 1   alendronate (FOSAMAX) 70 MG tablet Take 70 mg by mouth once a week.     Cholecalciferol (VITAMIN D3 PO) Take 2,000 Units by mouth daily.     fluticasone (FLONASE) 50 MCG/ACT nasal spray Place 2 sprays into both nostrils daily. 16 g 12   Fluticasone-Salmeterol (ADVAIR) 250-50 MCG/DOSE AEPB Inhale 1 puff into the lungs 2 (two) times daily.      levothyroxine (SYNTHROID) 75 MCG tablet TAKE 1 TABLET BY MOUTH EVERY DAY BEFORE BREAKFAST 30 tablet  2   Multiple Vitamin (MULTIVITAMIN) tablet Take 1 tablet by mouth daily.     No current facility-administered medications for this visit.    SURGICAL HISTORY:  Past Surgical History:  Procedure Laterality Date   APPENDECTOMY  3329   APPLICATION OF CRANIAL NAVIGATION Right 01/11/2022   Procedure: APPLICATION OF CRANIAL NAVIGATION;  Surgeon: Eustace Moore, MD;  Location: [place];  Service: Neurosurgery;  Laterality: Right;   COLON SURGERY   2008   colostomy-infection post op appendectomy   COLOSTOMY REVERSAL  2009   CRANIOTOMY Right 01/11/2022   Procedure: RIGHT CRANIOTOMY FOR TUMOR RESECTION WITH BRAIN LAB;  Surgeon: Eustace Moore, MD;  Location: [place];  Service: Neurosurgery;  Laterality: Right;   INTERCOSTAL NERVE BLOCK Right 10/19/2020   Procedure: INTERCOSTAL NERVE BLOCK;  Surgeon: [name], MD;  Location: [facility];  Service: Thoracic;  Laterality: Right;   NODE DISSECTION Right 10/19/2020   Procedure: NODE DISSECTION;  Surgeon: [name], MD;  Location: [place];  Service: Thoracic;  Laterality: Right;   VIDEO BRONCHOSCOPY WITH ENDOBRONCHIAL NAVIGATION N/A 05/08/2020   Procedure: VIDEO BRONCHOSCOPY WITH ENDOBRONCHIAL NAVIGATION;  Surgeon: [name], MD;  Location: [place];  Service: Thoracic;  Laterality: N/A;   VIDEO BRONCHOSCOPY WITH ENDOBRONCHIAL ULTRASOUND N/A 05/08/2020   Procedure: VIDEO BRONCHOSCOPY WITH ENDOBRONCHIAL ULTRASOUND;  Surgeon: [name], MD;  Location: [facility];  Service: Thoracic;  Laterality: N/A;    REVIEW OF SYSTEMS:  Constitutional: negative Eyes: negative Ears, nose, mouth, throat, and face: negative Respiratory: negative Cardiovascular: negative Gastrointestinal: negative Genitourinary:negative Integument/breast: negative Hematologic/lymphatic: negative Musculoskeletal:negative Neurological: negative Behavioral/Psych: negative Endocrine: negative Allergic/Immunologic: negative   PHYSICAL EXAMINATION: General appearance: alert, cooperative, and no distress Head: Normocephalic, without obvious abnormality, atraumatic Neck: no adenopathy, no JVD, supple, symmetrical, trachea midline, and thyroid not enlarged, symmetric, no tenderness/mass/nodules Lymph nodes: Cervical, supraclavicular, and axillary nodes normal. Resp: clear to auscultation bilaterally Back: symmetric, no curvature. ROM normal. No CVA tenderness. Cardio: regular rate and rhythm, S1, S2  normal, no murmur, click, rub or gallop GI: soft, non-tender; bowel sounds normal; no masses,  no organomegaly Extremities: extremities normal, atraumatic, no cyanosis or edema Neurologic: Alert and oriented X 3, normal strength and tone. Normal symmetric reflexes. Normal coordination and gait  ECOG PERFORMANCE STATUS: 1 - Symptomatic but completely ambulatory  Blood pressure 123/61, pulse 76, resp. rate 18, weight 118 lb 8 oz (53.8 kg), SpO2 100 %.  LABORATORY DATA: Lab Results  Component Value Date   WBC 6.3 04/29/2022   HGB 12.1 04/29/2022   HCT 36.0 04/29/2022   MCV 90.7 04/29/2022   PLT 264 04/29/2022      Chemistry      Component Value Date/Time   NA 140 04/29/2022 1519   NA 135 10/29/2017 1131   K 3.7 04/29/2022 1519   CL 108 04/29/2022 1519   CO2 24 04/29/2022 1519   BUN 9 04/29/2022 1519   BUN 5 (L) 10/29/2017 1131   CREATININE 0.48 04/29/2022 1519      Component Value Date/Time   CALCIUM 9.3 04/29/2022 1519   ALKPHOS 56 04/29/2022 1519   AST 21 04/29/2022 1519   ALT 14 04/29/2022 1519   BILITOT <0.1 (L) 04/29/2022 1519       RADIOGRAPHIC STUDIES: CT Chest W Contrast  Result Date: 04/30/2022 CLINICAL DATA:  Primary Cancer Type: Lung Imaging Indication: Routine surveillance Interval therapy since last imaging? No Initial Cancer Diagnosis Date: 05/08/2020; Established by: Biopsy-proven Detailed Pathology: Stage IIIA  non-small cell lung cancer, adenocarcinoma. Primary Tumor location:  Right upper lobe. Surgeries: Right upper lobectomy 10/19/2020. Resection of brain mass 01/11/2022. Appendectomy. Colon surgery 2008. Chemotherapy: Yes; Ongoing? No; Most recent administration: 02/13/2021 Immunotherapy?  Yes; Type: Atezolizumab; Ongoing? No Radiation therapy? Yes; Date Range: 01/03/2022 - 01/09/2022; Target: Brain * Tracking Code: BO * EXAM: CT CHEST, ABDOMEN, AND PELVIS WITH CONTRAST TECHNIQUE: Multidetector CT imaging of the chest, abdomen and pelvis was performed  following the standard protocol during bolus administration of intravenous contrast. RADIATION DOSE REDUCTION: This exam was performed according to the departmental dose-optimization program which includes automated exposure control, adjustment of the mA and/or kV according to patient size and/or use of iterative reconstruction technique. CONTRAST:  173m OMNIPAQUE IOHEXOL 300 MG/ML  SOLN COMPARISON:  Most recent CT chest, abdomen and pelvis 01/01/2022. 04/06/2020 PET-CT. FINDINGS: CT CHEST FINDINGS Cardiovascular: Aortic atherosclerosis. Normal heart size, without pericardial effusion. Three vessel coronary artery calcification. No central pulmonary embolism, on this non-dedicated study. Mediastinum/Nodes: No mediastinal or hilar adenopathy. Lungs/Pleura: No pleural fluid. Right upper lobectomy. Mild centrilobular emphysema. Subtle interstitial thickening and nodularity within the peribronchovascular superior segment left lower lobe including on images 62 and 63 of series 6, new. Also identified on images eighty seven through ninety five of series four coronal. Musculoskeletal: Nonunited right posterior eleventh rib fracture again identified. Anterolateral left fourth and fifth rib sclerosis is new and likely due to interval healed fractures. CT ABDOMEN PELVIS FINDINGS Hepatobiliary: Inferior right hepatic lobe subcentimeter low-density lesion is unchanged and likely a cyst. No suspicious liver lesion. Multiple gallstones without acute cholecystitis or biliary duct dilatation. Pancreas: Normal, without mass or ductal dilatation. Spleen: Normal in size, without focal abnormality. Adrenals/Urinary Tract: Normal adrenal glands. Left renal sinus cysts. Interpolar left renal too small to characterize lesion. Normal right kidney. No hydronephrosis. Normal urinary bladder. Stomach/Bowel: Normal stomach, without wall thickening. Scattered colonic diverticula. Normal terminal ileum. Appendectomy. Normal small bowel.  Vascular/Lymphatic: Aortic atherosclerosis. No abdominopelvic adenopathy. Reproductive: Retroverted uterus.  No adnexal mass. Other: No significant free fluid. No evidence of omental or peritoneal disease. Paramidline right small fat containing ventral abdominal wall hernias unchanged. Musculoskeletal: Mild S shaped thoracolumbar spine curvature with degenerate disc disease at the lumbosacral junction. IMPRESSION: 1. Right upper lobectomy, without typical findings of recurrent or metastatic disease. 2. Superior segment left lower lobe subtle peribronchovascular nodularity is new and favored to be due to interval infection of indeterminate acuity. Recommend attention on follow-up. 3. Cholelithiasis 4. Incidental findings, including: Aortic atherosclerosis (ICD10-I70.0), coronary artery atherosclerosis and emphysema (ICD10-J43.9). Electronically Signed   By: KAbigail MiyamotoM.D.   On: 04/30/2022 11:40   CT Abdomen Pelvis W Contrast  Result Date: 04/30/2022 CLINICAL DATA:  Primary Cancer Type: Lung Imaging Indication: Routine surveillance Interval therapy since last imaging? No Initial Cancer Diagnosis Date: 05/08/2020; Established by: Biopsy-proven Detailed Pathology: Stage IIIA non-small cell lung cancer, adenocarcinoma. Primary Tumor location:  Right upper lobe. Surgeries: Right upper lobectomy 10/19/2020. Resection of brain mass 01/11/2022. Appendectomy. Colon surgery 2008. Chemotherapy: Yes; Ongoing? No; Most recent administration: 02/13/2021 Immunotherapy?  Yes; Type: Atezolizumab; Ongoing? No Radiation therapy? Yes; Date Range: 01/03/2022 - 01/09/2022; Target: Brain * Tracking Code: BO * EXAM: CT CHEST, ABDOMEN, AND PELVIS WITH CONTRAST TECHNIQUE: Multidetector CT imaging of the chest, abdomen and pelvis was performed following the standard protocol during bolus administration of intravenous contrast. RADIATION DOSE REDUCTION: This exam was performed according to the departmental dose-optimization program which  includes automated exposure control, adjustment of the mA and/or  kV according to patient size and/or use of iterative reconstruction technique. CONTRAST:  150m OMNIPAQUE IOHEXOL 300 MG/ML  SOLN COMPARISON:  Most recent CT chest, abdomen and pelvis 01/01/2022. 04/06/2020 PET-CT. FINDINGS: CT CHEST FINDINGS Cardiovascular: Aortic atherosclerosis. Normal heart size, without pericardial effusion. Three vessel coronary artery calcification. No central pulmonary embolism, on this non-dedicated study. Mediastinum/Nodes: No mediastinal or hilar adenopathy. Lungs/Pleura: No pleural fluid. Right upper lobectomy. Mild centrilobular emphysema. Subtle interstitial thickening and nodularity within the peribronchovascular superior segment left lower lobe including on images 62 and 63 of series 6, new. Also identified on images eighty seven through ninety five of series four coronal. Musculoskeletal: Nonunited right posterior eleventh rib fracture again identified. Anterolateral left fourth and fifth rib sclerosis is new and likely due to interval healed fractures. CT ABDOMEN PELVIS FINDINGS Hepatobiliary: Inferior right hepatic lobe subcentimeter low-density lesion is unchanged and likely a cyst. No suspicious liver lesion. Multiple gallstones without acute cholecystitis or biliary duct dilatation. Pancreas: Normal, without mass or ductal dilatation. Spleen: Normal in size, without focal abnormality. Adrenals/Urinary Tract: Normal adrenal glands. Left renal sinus cysts. Interpolar left renal too small to characterize lesion. Normal right kidney. No hydronephrosis. Normal urinary bladder. Stomach/Bowel: Normal stomach, without wall thickening. Scattered colonic diverticula. Normal terminal ileum. Appendectomy. Normal small bowel. Vascular/Lymphatic: Aortic atherosclerosis. No abdominopelvic adenopathy. Reproductive: Retroverted uterus.  No adnexal mass. Other: No significant free fluid. No evidence of omental or peritoneal  disease. Paramidline right small fat containing ventral abdominal wall hernias unchanged. Musculoskeletal: Mild S shaped thoracolumbar spine curvature with degenerate disc disease at the lumbosacral junction. IMPRESSION: 1. Right upper lobectomy, without typical findings of recurrent or metastatic disease. 2. Superior segment left lower lobe subtle peribronchovascular nodularity is new and favored to be due to interval infection of indeterminate acuity. Recommend attention on follow-up. 3. Cholelithiasis 4. Incidental findings, including: Aortic atherosclerosis (ICD10-I70.0), coronary artery atherosclerosis and emphysema (ICD10-J43.9). Electronically Signed   By: KAbigail MiyamotoM.D.   On: 04/30/2022 11:40    ASSESSMENT AND PLAN: This is a very pleasant 71years old white female with metastatic non-small cell lung cancer, adenocarcinoma that was initially diagnosed as stage IIIa (T1b, N2, M0) non-small cell lung cancer, adenocarcinoma presented with right upper lobe lung nodule in addition to mediastinal lymphadenopathy.  PD-L1 expression 90%.  The patient has evidence for metastatic disease with solitary brain metastasis in April 2023 The patient underwent a course of neoadjuvant treatment with carboplatin for AUC of 5, Alimta 500 mg/M2 and Keytruda 200 mg IV every 3 weeks for 3 cycles.   She tolerated this treatment well with no concerning adverse effect except for dry skin and itching. Her repeat CT scan after the neoadjuvant treatment showed partial response with around 50% reduction in the tumor volume. The patient underwent right upper lobectomy with lymph node dissection on October 19, 2020 and the final pathology showed residual tumor measuring 0.5 cm with lymph node metastasis involving hilar and mediastinal lymph nodes. She underwent adjuvant systemic chemotherapy with carboplatin for AUC of 5, Alimta 500 mg/M2 every 3 weeks since she has good response to this treatment in the past.  She is status post  4 cycles.  She tolerated this treatment well with no concerning adverse effect except for mild fatigue. She underwent adjuvant immunotherapy with Tecentriq 1200 Mg IV every 3 weeks status post 12 cycles.  Last dose was given on November 12, 2021 discontinued after the patient develop metastatic brain lesions and she has been off treatment since her  diagnosis with the brain metastasis.. The patient underwent craniotomy with resection of the solitary brain metastasis followed by SRS to the surgical cavity. The patient is currently on observation and has been doing fine with no concerning complaints. She had repeat CT scan of the chest, abdomen and pelvis performed recently.  I personally and independently reviewed the scan and discussed the result with the patient today. I recommended for her to continue on observation with repeat CT scan of the chest, abdomen and pelvis in 3 months. She was advised to call immediately if she has any concerning symptoms in the interval. The patient voices understanding of current disease status and treatment options and is in agreement with the current care plan.  All questions were answered. The patient knows to call the clinic with any problems, questions or concerns. We can certainly see the patient much sooner if necessary. The total time spent in the appointment was 30 minutes.   Disclaimer: This note was dictated with voice recognition software. Similar sounding words can inadvertently be transcribed and may not be corrected upon review.

## 2022-08-07 ENCOUNTER — Ambulatory Visit
Admission: RE | Admit: 2022-08-07 | Discharge: 2022-08-07 | Disposition: A | Discharge status: Home / Self Care | Payer: Medicare Other | Source: Ambulatory Visit | Attending: Radiation Oncology | Admitting: Radiation Oncology

## 2022-08-07 DIAGNOSIS — C7931 Secondary malignant neoplasm of brain: Secondary | ICD-10-CM

## 2022-08-07 MED ORDER — GADOPICLENOL 0.5 MMOL/ML IV SOLN
6.0000 mL | Freq: Once | INTRAVENOUS | Status: AC | PRN
  Administered 2022-08-07: 6 mL via INTRAVENOUS

## 2022-08-14 ENCOUNTER — Ambulatory Visit
Admission: RE | Admit: 2022-08-14 | Discharge: 2022-08-14 | Disposition: A | Discharge status: Home / Self Care | Payer: Medicare Other | Source: Ambulatory Visit | Attending: Urology | Admitting: Urology

## 2022-08-14 ENCOUNTER — Encounter: Payer: Self-pay | Admitting: Urology

## 2022-08-14 DIAGNOSIS — C3490 Malignant neoplasm of unspecified part of unspecified bronchus or lung: Secondary | ICD-10-CM

## 2022-08-14 NOTE — Progress Notes (Signed)
Telephone nursing appointment for 71 yo woman with h/o a 3.5 cm solitary right occipital brain metastasis from Stage T1b N2 M0 adenocarcinoma of the right upper lung with 90% PD-L1 expression on immunotherapy. I verified patient's identity and began nursing interview. Patient reports doing well. No issues at this time.   Meaningful use complete.   Patient aware of their 9:30am-08/14/22 telephone appointment w/ Ashlyn Bruning PA-C. I left my extension [phone] in case patient needs anything. Patient verbalized understanding. This concludes the nursing interview.   Patient contact [phone]     Leandra Kern, LPN

## 2022-08-14 NOTE — Progress Notes (Signed)
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    Narrative:  I spoke with the patient to conduct her routine scheduled 3 month follow up visit to review her recent MRI brain results via telephone to spare the patient unnecessary potential exposure in the healthcare setting during the current COVID-19 pandemic.  The patient was notified in advance and gave permission to proceed with this visit format.  She tolerated her pre-op radiation treatment well and subsequently underwent surgical resection under the care of Dr. Jones on 01/11/2022 and has recovered well from her surgery.  She had a recent follow-up MRI brain scan on 08/07/2022 showing further evolution of posttreatment changes in the occipital lobe but no new enhancing lesions. There was a small 10-12 mm rounded area of subdural enhancement that is slightly more conspicuous on this study but felt most likely to represent postoperative dural irregularity.  There were no suspicious diffusion changes and no other suspicious postcontrast enhancement.  We reviewed these results today.  She is off systemic therapy and being followed in observation only with Dr. Mohamed. Her most recent restaging CT C/A/P from 07/30/22 was stable without any evidence of disease recurrence or progression. She will have a repeat CT C/A/P scan in 3 months prior to her next scheduled follow up with Dr. Mohamed on 11/05/22.  Overall, she is  quite pleased with her progress to date.             On review of systems, the patient states that she is doing well in general.  She specifically denies headaches, nausea, vomiting, dizziness/imbalance or any changes in her visual or auditory acuity.  Her hair continues to gradually fill in within the treatment field where it had thinned significantly.  She continues making progress in regards to her strength and activity level and denies any focal weakness or paraesthesias.    ALLERGIES:  has No Known Allergies.  Meds: Current Outpatient Medications  Medication Sig Dispense Refill   acetaminophen (TYLENOL) 325 MG tablet Take 1-2 tablets (325-650 mg total) by mouth every 4 (four) hours as needed for mild pain.     albuterol (PROVENTIL HFA;VENTOLIN HFA) 108 (90 Base) MCG/ACT inhaler Inhale 2 puffs into the lungs every 4 (four) hours as needed for wheezing or shortness of breath (cough, shortness of breath or wheezing.). 1 Inhaler 1   alendronate (FOSAMAX) 70 MG tablet Take 70 mg by mouth once a week.     Cholecalciferol (VITAMIN D3 PO) Take 2,000 Units by mouth daily.     fluticasone (FLONASE) 50 MCG/ACT nasal spray Place 2 sprays into both nostrils daily. 16 g 12   Fluticasone-Salmeterol (ADVAIR) 250-50 MCG/DOSE AEPB Inhale 1 puff into the lungs 2 (two) times daily.      levothyroxine (SYNTHROID) 75 MCG tablet TAKE 1 TABLET BY MOUTH EVERY DAY BEFORE BREAKFAST 30 tablet 2   Multiple Vitamin (MULTIVITAMIN) tablet Take 1 tablet by mouth daily.       No current facility-administered medications for this encounter.    Physical Findings:  vitals were not taken for this visit.  Pain Assessment Pain Score: 0-No pain/10 Unable to assess due to telephone follow-up visit format.  Lab Findings: Lab Results  Component Value Date   WBC 7.8 07/30/2022   HGB 13.0 07/30/2022   HCT 38.3 07/30/2022   MCV 91.8 07/30/2022   PLT 257 07/30/2022     Radiographic Findings: MR Brain W Wo  Contrast  Result Date: 08/09/2022 CLINICAL DATA:.: CLINICAL DATA:. 71 year old female with metastatic lung cancer. Solitary right occipital lesion resection April this year. Restaging. EXAM: MRI HEAD WITHOUT AND WITH CONTRAST TECHNIQUE: Multiplanar, multiecho pulse sequences of the brain and surrounding structures were obtained without and with intravenous contrast. CONTRAST:  6 mL Vueway COMPARISON:  Brain MRI 05/03/2022 and earlier. FINDINGS: Brain: Right posterior craniotomy changes with underlying occipital lobe treatment area demonstrating stable ex vacuo enlargement of the occipital horn and regional T2/FLAIR hyperintensity. Postoperative hemosiderin, and fairly smooth overlying dural thickening and enhancement. However, small indeterminate 10-12 mm area of rounded subdural enhancement is more conspicuous now (series 17, image 14 and series 14, image 96) although there is also some intrinsic T1 hyperintensity here. No suspicious diffusion changes. No other suspicious postcontrast enhancement. No new enhancing lesion. No superimposed No restricted diffusion to suggest acute infarction. No midline shift, mass effect, evidence of mass lesion, ventriculomegaly, extra-axial collection or acute intracranial hemorrhage. Cervicomedullary junction and pituitary are within normal limits. 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 Stable scattered nonspecific bilateral hemisphere white matter T2 and FLAIR hyperintensity. No new signal abnormality. Vascular: Major intracranial vascular flow voids are stable. Skull and upper cervical spine: Right posterior craniotomy. Stable visible bone marrow signal, cervical spine. Sinuses/Orbits: Stable, negative. Other: Mastoids remain clear. Visible internal auditory structures appear normal. Stable visible scalp and face. IMPRESSION: 1. Further evolution of post treatment changes in the right occipital lobe. A small 10-12 mm rounded area of enhancement is probably  postoperative dural irregularity (series 15, image 98). No other adverse features. Attention is directed on follow-up. 2. No new metastatic disease. 3. Stable chronic small vessel disease. Electronically Signed   By: Genevie Ann M.D.   On: 08/09/2022 10:18   CT Chest W Contrast  Result Date: 07/31/2022 CLINICAL DATA:  Non-small cell lung cancer. RIGHT upper lobe adenocarcinoma. Metastatic mediastinal adenopathy. Stage IIIA. * Tracking Code: BO * EXAM: CT CHEST, ABDOMEN, AND PELVIS WITH CONTRAST TECHNIQUE: Multidetector CT imaging of the chest, abdomen and pelvis was performed following the standard protocol during bolus administration of intravenous contrast. RADIATION DOSE REDUCTION: This exam was performed according to the departmental dose-optimization program which includes automated exposure control, adjustment of the mA and/or kV according to patient size and/or use of iterative reconstruction technique. CONTRAST:  132m OMNIPAQUE IOHEXOL 300 MG/ML  SOLN COMPARISON:  None Available. FINDINGS: CT CHEST FINDINGS Cardiovascular: No significant vascular findings. Normal heart size. No pericardial effusion. Mediastinum/Nodes: No axillary or supraclavicular adenopathy. No mediastinal or hilar adenopathy. No pericardial fluid. Esophagus normal. Lungs/Pleura: Surgical resection margin in the RIGHT upper lung. New or suspicious nodularity. LEFT lung is clear. Musculoskeletal: No aggressive osseous lesion. Remote fracture of the posterior RIGHT eleventh rib. CT ABDOMEN AND PELVIS FINDINGS Hepatobiliary: No focal hepatic lesion. Several gallstones the body and neck of the gallbladder. Pancreas: Pancreas is normal. No ductal dilatation. No pancreatic inflammation. Spleen: Normal spleen Adrenals/urinary tract: Adrenal glands and kidneys are normal. The ureters and bladder normal. Stomach/Bowel: Stomach, small-bowel and  cecum normal. Post appendectomy. Multiple diverticula of the descending colon and sigmoid colon without  acute inflammation. Vascular/Lymphatic: Abdominal aorta is normal caliber with atherosclerotic calcification. There is no retroperitoneal or periportal lymphadenopathy. No pelvic lymphadenopathy. Reproductive: Uterus and adnexa unremarkable. Other: No free fluid. Musculoskeletal: No aggressive osseous lesion. IMPRESSION: Chest Impression: 1. Postsurgical change in the RIGHT lung. No evidence of local recurrence. 2. No metastatic adenopathy or skeletal metastasis Abdomen / Pelvis Impression: 1. No visceral metastasis or skeletal metastasis 2. No lymphadenopathy 3. Cholelithiasis without evidence acute cholecystitis. 4.  Aortic Atherosclerosis (ICD10-I70.0). Electronically Signed   By: Suzy Bouchard M.D.   On: 07/31/2022 12:46   CT Abdomen Pelvis W Contrast  Result Date: 07/31/2022 CLINICAL DATA:  Non-small cell lung cancer. RIGHT upper lobe adenocarcinoma. Metastatic mediastinal adenopathy. Stage IIIA. * Tracking Code: BO * EXAM: CT CHEST, ABDOMEN, AND PELVIS WITH CONTRAST TECHNIQUE: Multidetector CT imaging of the chest, abdomen and pelvis was performed following the standard protocol during bolus administration of intravenous contrast. RADIATION DOSE REDUCTION: This exam was performed according to the departmental dose-optimization program which includes automated exposure control, adjustment of the mA and/or kV according to patient size and/or use of iterative reconstruction technique. CONTRAST:  190m OMNIPAQUE IOHEXOL 300 MG/ML  SOLN COMPARISON:  None Available. FINDINGS: CT CHEST FINDINGS Cardiovascular: No significant vascular findings. Normal heart size. No pericardial effusion. Mediastinum/Nodes: No axillary or supraclavicular adenopathy. No mediastinal or hilar adenopathy. No pericardial fluid. Esophagus normal. Lungs/Pleura: Surgical resection margin in the RIGHT upper lung. New or suspicious nodularity. LEFT lung is clear. Musculoskeletal: No aggressive osseous lesion. Remote fracture of the  posterior RIGHT eleventh rib. CT ABDOMEN AND PELVIS FINDINGS Hepatobiliary: No focal hepatic lesion. Several gallstones the body and neck of the gallbladder. Pancreas: Pancreas is normal. No ductal dilatation. No pancreatic inflammation. Spleen: Normal spleen Adrenals/urinary tract: Adrenal glands and kidneys are normal. The ureters and bladder normal. Stomach/Bowel: Stomach, small-bowel and cecum normal. Post appendectomy. Multiple diverticula of the descending colon and sigmoid colon without acute inflammation. Vascular/Lymphatic: Abdominal aorta is normal caliber with atherosclerotic calcification. There is no retroperitoneal or periportal lymphadenopathy. No pelvic lymphadenopathy. Reproductive: Uterus and adnexa unremarkable. Other: No free fluid. Musculoskeletal: No aggressive osseous lesion. IMPRESSION: Chest Impression: 1. Postsurgical change in the RIGHT lung. No evidence of local recurrence. 2. No metastatic adenopathy or skeletal metastasis Abdomen / Pelvis Impression: 1. No visceral metastasis or skeletal metastasis 2. No lymphadenopathy 3. Cholelithiasis without evidence acute cholecystitis. 4.  Aortic Atherosclerosis (ICD10-I70.0). Electronically Signed   By: SSuzy BouchardM.D.   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 She appears to have recovered well from the effects of her preop SRS treatment and remains without complaints.  She saw Dr. MJulien Nordmannon 08/01/2022 and the current plan is to continue on observation only with  a repeat CT chest scan in February 2024, prior to her next scheduled follow-up.  We reviewed the results of her recent follow-up MRI brain scan on 08/07/2022 showing further evolution of posttreatment changes in the occipital lobe but no new enhancing lesions. There was a small 10-12 mm rounded area  of subdural enhancement that is slightly more conspicuous on this study but felt most likely to represent postoperative dural irregularity.  There were no suspicious diffusion changes and no other suspicious postcontrast enhancement.  We  discussed that her scan will be reviewed in the upcoming [facility] on 08/19/22 and unless consensus recommendation is otherwise, we will continue with serial MRI brain scans every 3 months to monitor for any evidence of disease progression or recurrence and I will contact her by phone following each scan to review the results and any recommendations from the multidisciplinary brain conference.  She will be contacted immediately following conference on 08/19/22 if there is any change in the current plan. She appears to have a good understanding of her disease and our recommendations and is comfortable and in agreement with the stated plan.  She knows that she is welcome to call at anytime in the interim with any questions or concerns.  I personally spent 20 minutes in this encounter including chart review, reviewing radiological studies, telephone conversation with the patient, entering orders and completing documentation.     Ashlyn W. Bruning, PA-C  

## 2022-08-19 ENCOUNTER — Inpatient Hospital Stay: Payer: Medicare Other | Attending: Physician Assistant

## 2022-09-07 ENCOUNTER — Other Ambulatory Visit: Payer: Self-pay | Admitting: Internal Medicine

## 2022-09-18 ENCOUNTER — Other Ambulatory Visit: Payer: Self-pay | Admitting: Radiation Therapy

## 2022-09-18 DIAGNOSIS — C7931 Secondary malignant neoplasm of brain: Secondary | ICD-10-CM

## 2022-10-15 ENCOUNTER — Telehealth: Payer: Self-pay | Admitting: Internal Medicine

## 2022-10-15 NOTE — Telephone Encounter (Signed)
Rescheduled 02/20 appointment time due to provider on-call. Patient has been called and a voicemail was left regarding the new appointment time.

## 2022-10-18 ENCOUNTER — Telehealth: Payer: Self-pay | Admitting: Internal Medicine

## 2022-10-18 NOTE — Telephone Encounter (Signed)
Called patient regarding upcoming February appointments, patient is notified.

## 2022-11-01 ENCOUNTER — Inpatient Hospital Stay: Payer: Medicare Other | Attending: Physician Assistant

## 2022-11-01 ENCOUNTER — Ambulatory Visit (HOSPITAL_COMMUNITY)
Admission: RE | Admit: 2022-11-01 | Discharge: 2022-11-01 | Disposition: A | Discharge status: Home / Self Care | Payer: Medicare Other | Source: Ambulatory Visit | Attending: Internal Medicine | Admitting: Internal Medicine

## 2022-11-01 DIAGNOSIS — C3490 Malignant neoplasm of unspecified part of unspecified bronchus or lung: Secondary | ICD-10-CM

## 2022-11-01 DIAGNOSIS — C7931 Secondary malignant neoplasm of brain: Secondary | ICD-10-CM | POA: Insufficient documentation

## 2022-11-01 DIAGNOSIS — C3411 Malignant neoplasm of upper lobe, right bronchus or lung: Secondary | ICD-10-CM | POA: Insufficient documentation

## 2022-11-01 DIAGNOSIS — C771 Secondary and unspecified malignant neoplasm of intrathoracic lymph nodes: Secondary | ICD-10-CM | POA: Insufficient documentation

## 2022-11-01 LAB — CBC WITH DIFFERENTIAL (CANCER CENTER ONLY)
Abs Immature Granulocytes: 0.02 10*3/uL (ref 0.00–0.07)
Basophils Absolute: 0 10*3/uL (ref 0.0–0.1)
Basophils Relative: 0 %
Eosinophils Absolute: 0 10*3/uL (ref 0.0–0.5)
Eosinophils Relative: 1 %
HCT: 40.1 % (ref 36.0–46.0)
Hemoglobin: 13.5 g/dL (ref 12.0–15.0)
Immature Granulocytes: 0 %
Lymphocytes Relative: 23 %
Lymphs Abs: 1.9 10*3/uL (ref 0.7–4.0)
MCH: 31.5 pg (ref 26.0–34.0)
MCHC: 33.7 g/dL (ref 30.0–36.0)
MCV: 93.7 fL (ref 80.0–100.0)
Monocytes Absolute: 0.6 10*3/uL (ref 0.1–1.0)
Monocytes Relative: 7 %
Neutro Abs: 5.5 10*3/uL (ref 1.7–7.7)
Neutrophils Relative %: 69 %
Platelet Count: 257 10*3/uL (ref 150–400)
RBC: 4.28 MIL/uL (ref 3.87–5.11)
RDW: 12.2 % (ref 11.5–15.5)
WBC Count: 8 10*3/uL (ref 4.0–10.5)
nRBC: 0 % (ref 0.0–0.2)

## 2022-11-01 LAB — CMP (CANCER CENTER ONLY)
ALT: 11 U/L (ref 0–44)
AST: 18 U/L (ref 15–41)
Albumin: 4.4 g/dL (ref 3.5–5.0)
Alkaline Phosphatase: 57 U/L (ref 38–126)
Anion gap: 5 (ref 5–15)
BUN: 8 mg/dL (ref 8–23)
CO2: 30 mmol/L (ref 22–32)
Calcium: 9.3 mg/dL (ref 8.9–10.3)
Chloride: 107 mmol/L (ref 98–111)
Creatinine: 0.54 mg/dL (ref 0.44–1.00)
GFR, Estimated: >60 mL/min (ref >60)
Glucose, Bld: 76 mg/dL (ref 70–99)
Potassium: 4 mmol/L (ref 3.5–5.1)
Sodium: 142 mmol/L (ref 135–145)
Total Bilirubin: 0.3 mg/dL (ref 0.3–1.2)
Total Protein: 6.9 g/dL (ref 6.5–8.1)

## 2022-11-01 MED ORDER — IOHEXOL 300 MG/ML  SOLN
100.0000 mL | Freq: Once | INTRAMUSCULAR | Status: AC | PRN
  Administered 2022-11-01: 100 mL via INTRAVENOUS

## 2022-11-01 NOTE — Progress Notes (Unsigned)
[facility] OFFICE PROGRESS NOTE  [name], [place] [address] [place] [id number]  DIAGNOSIS: Metastatic non-small cell lung cancer initially diagnosed as stage IIIA (T1b, N2, M0) non-small cell lung cancer, adenocarcinoma presented with right upper lobe lung nodule as well as right upper paratracheal adenopathy and suspicious tiny nodule in the right middle lobe.  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  First dose 05/30/2020. Status post 3 cycles. 2) status post robotic assisted right thoracoscopy with right upper lobectomy and lymph node dissection under the care of Dr. Roxan Hockey on October 19, 2020.  There was residual tumor measuring 0.5 cm with lymph node metastasis involving 4R, 12 and hilar lymph nodes. 3) Adjuvant systemic chemotherapy with carboplatin for AUC of 5 and Alimta 500 Mg/M2 every 3 weeks.  First dose was given December 11, 2020.  Status post 4 cycles.  Last dose of chemotherapy was given Feb 13, 2021. 4) status post right posterior parietal craniotomy with resection of the brain mass under the care of Dr. Sherley Bounds followed by [facility] to the solitary brain metastasis under the care of Dr. Tammi Klippel.  The final pathology was consistent with metastatic adenocarcinoma of lung primary. 5) Adjuvant immunotherapy with Atezolizumab 1200 Mg IV every 3 weeks.  First dose March 27, 2021.  Status post 11 cycles.  Last cycle was given on November 12, 2021 this treatment was discontinued secondary to disease progression in the brain.  CURRENT THERAPY: Observation   INTERVAL HISTORY: [name] 72 y.o. female returns to the clinic today for a 51-month follow-up visit.  The patient last saw Dr. Julien Nordmann on 08/01/2022.  The patient is here for 74-month follow-up  visit.  She is currently on observation.  She is following closely with radiation oncology due to her history of metastatic disease to the brain and she is scheduled for routine follow-up surveillance brain scan on 11/07/2022.  She is feeling fairly well today she denies any fever, chills, night sweats, or unexplained weight loss. She has similar baseline dyspnea on exertion depending on the activity.  Denies any chest pain, cough, or hemoptysis.  Denies any nausea, vomiting, or diarrhea. She sometimes gets mild constipation. Denies any unusual headache or visual changes.  She recently had a restaging CT scan performed.  She is here today for evaluation to review her scan results.  MEDICAL HISTORY: Past Medical History:  Diagnosis Date   Cataract    COPD (chronic obstructive pulmonary disease) ([place])    NSCLC of upper lobe ([place]) 04/2020    ALLERGIES:  has No Known Allergies.  MEDICATIONS:  Current Outpatient Medications  Medication Sig Dispense Refill   acetaminophen (TYLENOL) 325 MG tablet Take 1-2 tablets (325-650 mg total) by mouth every 4 (four) hours as needed for mild pain.     albuterol (PROVENTIL HFA;VENTOLIN HFA) 108 (90 Base) MCG/ACT inhaler Inhale 2 puffs into the lungs every 4 (four) hours as needed for wheezing or shortness of breath (cough, shortness of breath or wheezing.). 1 Inhaler 1   alendronate (FOSAMAX) 70 MG tablet Take 70 mg by mouth once a week.     Cholecalciferol (VITAMIN D3 PO) Take 2,000 Units by mouth daily.     fluticasone (FLONASE) 50 MCG/ACT nasal spray Place 2 sprays into both nostrils daily.  16 g 12   Fluticasone-Salmeterol (ADVAIR) 250-50 MCG/DOSE AEPB Inhale 1 puff into the lungs 2 (two) times daily.      levothyroxine (SYNTHROID) 75 MCG tablet TAKE 1 TABLET BY MOUTH EVERY DAY BEFORE BREAKFAST 30 tablet 2   Multiple Vitamin (MULTIVITAMIN) tablet Take 1 tablet by mouth daily.     No current facility-administered medications for this visit.    SURGICAL  HISTORY:  Past Surgical History:  Procedure Laterality Date   APPENDECTOMY  [id number]   APPLICATION OF CRANIAL NAVIGATION Right 01/11/2022   Procedure: APPLICATION OF CRANIAL NAVIGATION;  Surgeon: Eustace Moore, MD;  Location: [facility];  Service: Neurosurgery;  Laterality: Right;   COLON SURGERY  2008   colostomy-infection post op appendectomy   COLOSTOMY REVERSAL  2009   CRANIOTOMY Right 01/11/2022   Procedure: RIGHT CRANIOTOMY FOR TUMOR RESECTION WITH BRAIN LAB;  Surgeon: Eustace Moore, MD;  Location: [facility];  Service: Neurosurgery;  Laterality: Right;   INTERCOSTAL NERVE BLOCK Right 10/19/2020   Procedure: INTERCOSTAL NERVE BLOCK;  Surgeon: [name], MD;  Location: [facility];  Service: Thoracic;  Laterality: Right;   NODE DISSECTION Right 10/19/2020   Procedure: NODE DISSECTION;  Surgeon: [name], MD;  Location: [facility];  Service: Thoracic;  Laterality: Right;   VIDEO BRONCHOSCOPY WITH ENDOBRONCHIAL NAVIGATION N/A 05/08/2020   Procedure: VIDEO BRONCHOSCOPY WITH ENDOBRONCHIAL NAVIGATION;  Surgeon: [name], MD;  Location: [facility];  Service: Thoracic;  Laterality: N/A;   VIDEO BRONCHOSCOPY WITH ENDOBRONCHIAL ULTRASOUND N/A 05/08/2020   Procedure: VIDEO BRONCHOSCOPY WITH ENDOBRONCHIAL ULTRASOUND;  Surgeon: [name], MD;  Location: [facility];  Service: Thoracic;  Laterality: N/A;    REVIEW OF SYSTEMS:   Review of Systems  Constitutional: Negative for appetite change, chills, fatigue, fever and unexpected weight change.  HENT:   Negative for mouth sores, nosebleeds, sore throat and trouble swallowing.   Eyes: Negative for eye problems and icterus.  Respiratory: Positive for mild occasional dyspnea on exertion.  Negative for cough, hemoptysis, and wheezing.   Cardiovascular: Negative for chest pain and leg swelling.  Gastrointestinal: Negative for abdominal pain, constipation, diarrhea, nausea and vomiting.  Genitourinary: Negative for bladder incontinence,  difficulty urinating, dysuria, frequency and hematuria.   Musculoskeletal: Negative for back pain, gait problem, neck pain and neck stiffness.  Skin: Negative for itching and rash.  Neurological: Negative for dizziness, extremity weakness, gait problem, headaches, light-headedness and seizures.  Hematological: Negative for adenopathy. Does not bruise/bleed easily.  Psychiatric/Behavioral: Negative for confusion, depression and sleep disturbance. The patient is not nervous/anxious.     PHYSICAL EXAMINATION:  Blood pressure (!) 129/59, pulse 79, temperature 98.1 F (36.7 C), temperature source Temporal, resp. rate 16, height 5' 2.5" (1.588 m), weight 117 lb 12.8 oz (53.4 kg), SpO2 99 %.  ECOG PERFORMANCE STATUS: 1  Physical Exam  Constitutional: Oriented to person, place, and time and well-developed, well-nourished, and in no distress. No distress.  HENT:  Head: Normocephalic and atraumatic.  Mouth/Throat: Oropharynx is clear and moist. No oropharyngeal exudate.  Eyes: Conjunctivae are normal. Right eye exhibits no discharge. Left eye exhibits no discharge. No scleral icterus.  Neck: Normal range of motion. Neck supple.  Cardiovascular: Normal rate, regular rhythm, normal heart sounds and intact distal pulses.   Pulmonary/Chest: Effort normal and breath sounds normal. No respiratory distress. No wheezes. No rales.  Abdominal: Soft. Bowel sounds are normal. Exhibits no distension and no mass. There is no tenderness.  Musculoskeletal: Normal range of motion.  Exhibits no edema.  Lymphadenopathy:    No cervical adenopathy.  Neurological: Alert and oriented to person, place, and time. Exhibits normal muscle tone. Gait normal. Coordination normal.  Skin: Skin is warm and dry. No rash noted. Not diaphoretic. No erythema. No pallor.  Psychiatric: Mood, memory and judgment normal.  Vitals reviewed.  LABORATORY DATA: Lab Results  Component Value Date   WBC 8.0 11/01/2022   HGB 13.5 11/01/2022    HCT 40.1 11/01/2022   MCV 93.7 11/01/2022   PLT 257 11/01/2022      Chemistry      Component Value Date/Time   NA 142 11/01/2022 0937   NA 135 10/29/2017 1131   K 4.0 11/01/2022 0937   CL 107 11/01/2022 0937   CO2 30 11/01/2022 0937   BUN 8 11/01/2022 0937   BUN 5 (L) 10/29/2017 1131   CREATININE 0.54 11/01/2022 0937      Component Value Date/Time   CALCIUM 9.3 11/01/2022 0937   ALKPHOS 57 11/01/2022 0937   AST 18 11/01/2022 0937   ALT 11 11/01/2022 0937   BILITOT 0.3 11/01/2022 0937       RADIOGRAPHIC STUDIES:  CT Chest W Contrast  Result Date: 11/02/2022 CLINICAL DATA:  Restaging non-small cell lung cancer. * Tracking Code: BO * EXAM: CT CHEST, ABDOMEN, AND PELVIS WITH CONTRAST TECHNIQUE: Multidetector CT imaging of the chest, abdomen and pelvis was performed following the standard protocol during bolus administration of intravenous contrast. RADIATION DOSE REDUCTION: This exam was performed according to the departmental dose-optimization program which includes automated exposure control, adjustment of the mA and/or kV according to patient size and/or use of iterative reconstruction technique. CONTRAST:  114mL OMNIPAQUE IOHEXOL 300 MG/ML  SOLN COMPARISON:  Multiple previous imaging studies. The most recent is 07/30/2022 FINDINGS: CT CHEST FINDINGS Cardiovascular: The heart is normal in size. No pericardial effusion. The aorta is normal in caliber. No dissection. Stable scattered atherosclerotic calcifications. Stable three-vessel coronary artery calcifications. The pulmonary arteries are unremarkable. Mediastinum/Nodes: No mediastinal or hilar mass or lymphadenopathy. The esophagus is unremarkable. Lungs/Pleura: Stable surgical changes from a right upper lobe lobectomy. No findings suspicious for recurrent tumor. No worrisome pulmonary nodules to suggest pulmonary metastatic disease. Stable underlying emphysematous changes and areas of pulmonary scarring. No pleural effusions or  pleural nodules. Stable right basilar scarring changes. Musculoskeletal: No breast masses, supraclavicular or axillary adenopathy. The bony thorax is intact. No worrisome bone lesions. CT ABDOMEN PELVIS FINDINGS Hepatobiliary: A few tiny low-attenuation hepatic lesions are stable. No worrisome hepatic lesions. No findings to suggest hepatic metastatic disease. No intrahepatic biliary dilatation. Numerous gallstones are noted the gallbladder but no findings for acute cholecystitis. No common bile duct dilatation. Pancreas: No mass, inflammation or ductal dilatation. Spleen: Normal size.  No lesions. Adrenals/Urinary Tract: The adrenal glands and kidneys are unremarkable and stable. The bladder is normal. Stomach/Bowel: The stomach, duodenum, small bowel and colon are unremarkable. Scattered colonic diverticulosis but no findings for acute diverticulitis. The appendix is surgically absent. Vascular/Lymphatic: Stable advanced atherosclerotic calcification involving the aorta and branch vessels but no aneurysm or dissection. The major venous structures are patent. No mesenteric or retroperitoneal mass or adenopathy. Reproductive: The uterus and ovaries are unremarkable. Other: No pelvic mass or adenopathy. No free pelvic fluid collections. No inguinal mass or adenopathy. Stable small periumbilical abdominal wall hernia containing fat. Musculoskeletal: No significant bony findings. No worrisome bone lesions. Stable lumbar facet disease. IMPRESSION: 1. Stable surgical changes from a right upper lobe lobectomy. No findings suspicious  for recurrent tumor or metastatic disease involving the chest, abdomen/pelvis or bony structures. 2. Stable emphysematous changes and pulmonary scarring. 3. Cholelithiasis. 4. Stable advanced atherosclerotic calcification involving the aorta and branch vessels including the coronary arteries. Aortic Atherosclerosis (ICD10-I70.0) and Emphysema (ICD10-J43.9). Electronically Signed   By: Marijo Sanes M.D.   On: 11/02/2022 10:46   CT Abdomen Pelvis W Contrast  Result Date: 11/02/2022 CLINICAL DATA:  Restaging non-small cell lung cancer. * Tracking Code: BO * EXAM: CT CHEST, ABDOMEN, AND PELVIS WITH CONTRAST TECHNIQUE: Multidetector CT imaging of the chest, abdomen and pelvis was performed following the standard protocol during bolus administration of intravenous contrast. RADIATION DOSE REDUCTION: This exam was performed according to the departmental dose-optimization program which includes automated exposure control, adjustment of the mA and/or kV according to patient size and/or use of iterative reconstruction technique. CONTRAST:  152mL OMNIPAQUE IOHEXOL 300 MG/ML  SOLN COMPARISON:  Multiple previous imaging studies. The most recent is 07/30/2022 FINDINGS: CT CHEST FINDINGS Cardiovascular: The heart is normal in size. No pericardial effusion. The aorta is normal in caliber. No dissection. Stable scattered atherosclerotic calcifications. Stable three-vessel coronary artery calcifications. The pulmonary arteries are unremarkable. Mediastinum/Nodes: No mediastinal or hilar mass or lymphadenopathy. The esophagus is unremarkable. Lungs/Pleura: Stable surgical changes from a right upper lobe lobectomy. No findings suspicious for recurrent tumor. No worrisome pulmonary nodules to suggest pulmonary metastatic disease. Stable underlying emphysematous changes and areas of pulmonary scarring. No pleural effusions or pleural nodules. Stable right basilar scarring changes. Musculoskeletal: No breast masses, supraclavicular or axillary adenopathy. The bony thorax is intact. No worrisome bone lesions. CT ABDOMEN PELVIS FINDINGS Hepatobiliary: A few tiny low-attenuation hepatic lesions are stable. No worrisome hepatic lesions. No findings to suggest hepatic metastatic disease. No intrahepatic biliary dilatation. Numerous gallstones are noted the gallbladder but no findings for acute cholecystitis. No common bile  duct dilatation. Pancreas: No mass, inflammation or ductal dilatation. Spleen: Normal size.  No lesions. Adrenals/Urinary Tract: The adrenal glands and kidneys are unremarkable and stable. The bladder is normal. Stomach/Bowel: The stomach, duodenum, small bowel and colon are unremarkable. Scattered colonic diverticulosis but no findings for acute diverticulitis. The appendix is surgically absent. Vascular/Lymphatic: Stable advanced atherosclerotic calcification involving the aorta and branch vessels but no aneurysm or dissection. The major venous structures are patent. No mesenteric or retroperitoneal mass or adenopathy. Reproductive: The uterus and ovaries are unremarkable. Other: No pelvic mass or adenopathy. No free pelvic fluid collections. No inguinal mass or adenopathy. Stable small periumbilical abdominal wall hernia containing fat. Musculoskeletal: No significant bony findings. No worrisome bone lesions. Stable lumbar facet disease. IMPRESSION: 1. Stable surgical changes from a right upper lobe lobectomy. No findings suspicious for recurrent tumor or metastatic disease involving the chest, abdomen/pelvis or bony structures. 2. Stable emphysematous changes and pulmonary scarring. 3. Cholelithiasis. 4. Stable advanced atherosclerotic calcification involving the aorta and branch vessels including the coronary arteries. Aortic Atherosclerosis (ICD10-I70.0) and Emphysema (ICD10-J43.9). Electronically Signed   By: Marijo Sanes M.D.   On: 11/02/2022 10:46     ASSESSMENT/PLAN:  This is a very pleasant 72 year old Caucasian female with metastatic non-small cell lung cancer, adenocarcinoma.  She initially was diagnosed with stage IIIa (T1b, N2, M0) non-small cell lung cancer, adenocarcinoma.  She presented with a right upper lobe lung mass in addition to mediastinal lymphadenopathy.  Her PD-L1 expression was 90%.  However she had evidence of metastatic disease to the brain with a solitary brain metastasis that  was found in April 2023.  She first underwent neoadjuvant treatment with carboplatin for an AUC of 5, Alimta 500 mg/m, Keytruda 200 mg IV every 3 weeks for 3 cycles.  Repeat CT scan after neoadjuvant treatment showed partial response with around 50% reduction in the tumor volume.  She then underwent right upper lobectomy and lymph node dissection on 10/19/2020 and the final pathology showed residual tumor measuring 0.5 cm with lymph node metastasis involving the hilar and mediastinal lymph nodes.  She then underwent adjuvant systemic chemotherapy with carboplatin for an AUC of 5, Alimta 500 mg/m every 3 weeks.  She is status post 4 cycles.  She then underwent adjuvant immunotherapy with Tecentriq 1200 mg IV every 3 weeks status post 12 cycles.  Her last dose was given on 11/12/2021 discontinued after the patient developed metastatic disease to the brain and she has been off treatment since her diagnosis with the brain lesion.  The patient underwent craniotomy with resection of the solitary brain metastasis followed by SRS to the cavity.  She is currently on observation and has been doing fine.  She simply had a restaging CT scan performed of the chest, abdomen, and pelvis.  The patient was seen with Dr. Julien Nordmann today.  Dr. Julien Nordmann personally independently reviewed the scan and discussed the results with the patient today.  The scan showed no evidence of disease progression.  Dr. Julien Nordmann recommends that she continue on observation with a repeat CT scan of the chest in 4 months.  She will follow-up with radiation oncology for her brain MRI on 11/07/2022.  The patient was advised to call immediately if she has any concerning symptoms in the interval. The patient voices understanding of current disease status and treatment options and is in agreement with the current care plan. All questions were answered. The patient knows to call the clinic with any problems, questions or concerns. We can  certainly see the patient much sooner if necessary        Orders Placed This Encounter  Procedures   CT Chest W Contrast    Standing Status:   Future    Standing Expiration Date:   11/05/2023    Order Specific Question:   If indicated for the ordered procedure, I authorize the administration of contrast media per Radiology protocol    Answer:   Yes    Order Specific Question:   Does the patient have a contrast media/X-ray dye allergy?    Answer:   No    Order Specific Question:   Preferred imaging location?    Answer:   [facility]   CBC with Differential ([place] Only)    Standing Status:   Future    Standing Expiration Date:   11/06/2023   CMP ([facility] only)    Standing Status:   Future    Standing Expiration Date:   11/06/2023       Tobe Sos [place], PA-C 11/05/22  ADDENDUM: Hematology/Oncology Attending: I had a face-to-face encounter with the patient today.  I reviewed her record, lab, scan and recommended her care plan.  This is a very pleasant 72 years old white female with metastatic non-small cell lung cancer that was initially diagnosed as stage IIIa adenocarcinoma presented with right upper lobe lung nodule in addition to right upper paratracheal adenopathy and suspicious tiny nodule in the right middle lobe.  He is status post a course of neoadjuvant systemic chemotherapy with carboplatin, Alimta and nivolumab followed by right upper lobectomy with lymph node dissection under the care  of Dr. Roxan Hockey with residual tumor of 0.5 cm and lymph node metastasis involving 4R, 12 and hilar lymph nodes.  She underwent adjuvant systemic chemotherapy with carboplatin, Alimta for 4 cycles.  She developed solitary brain metastasis in April 2023 status post posterior parietal craniotomy with resection of the tumor as well as SRS under the care of Dr. Tammi Klippel.  The patient then underwent adjuvant treatment with immunotherapy with atezolizumab for total of  11 cycles which started after the adjuvant systemic chemotherapy and discontinued after she had the disease progression to the brain. The patient is currently on observation and she is feeling fine with no concerning complaints. She had repeat CT scan of the chest performed recently.  I personally and independently reviewed the scan and discussed the result with the patient today. Her scan showed no concerning findings for disease progression. I recommended for her to continue on observation with repeat CT scan of the chest in 4 months. Unfortunately the patient lost her husband recently. She was advised to call immediately if she has any other concerning symptoms in the interval. Disclaimer: This note was dictated with voice recognition software. Similar sounding words can inadvertently be transcribed and may be missed upon review. Eilleen Kempf, MD 11/05/22

## 2022-11-05 ENCOUNTER — Ambulatory Visit: Payer: Medicare Other | Admitting: Internal Medicine

## 2022-11-05 ENCOUNTER — Inpatient Hospital Stay (HOSPITAL_BASED_OUTPATIENT_CLINIC_OR_DEPARTMENT_OTHER): Payer: Medicare Other | Admitting: Physician Assistant

## 2022-11-05 VITALS — BP 129/59 | HR 79 | Temp 98.1°F | Resp 16 | Ht 62.5 in | Wt 117.8 lb

## 2022-11-05 DIAGNOSIS — C3411 Malignant neoplasm of upper lobe, right bronchus or lung: Secondary | ICD-10-CM

## 2022-11-05 DIAGNOSIS — C7931 Secondary malignant neoplasm of brain: Secondary | ICD-10-CM | POA: Diagnosis not present

## 2022-11-05 DIAGNOSIS — C771 Secondary and unspecified malignant neoplasm of intrathoracic lymph nodes: Secondary | ICD-10-CM | POA: Diagnosis not present

## 2022-11-07 ENCOUNTER — Ambulatory Visit
Admission: RE | Admit: 2022-11-07 | Discharge: 2022-11-07 | Disposition: A | Discharge status: Home / Self Care | Payer: Medicare Other | Source: Ambulatory Visit | Attending: Radiation Oncology | Admitting: Radiation Oncology

## 2022-11-07 DIAGNOSIS — C7931 Secondary malignant neoplasm of brain: Secondary | ICD-10-CM

## 2022-11-07 MED ORDER — GADOPICLENOL 0.5 MMOL/ML IV SOLN
6.0000 mL | Freq: Once | INTRAVENOUS | Status: AC | PRN
  Administered 2022-11-07: 6 mL via INTRAVENOUS

## 2022-11-11 ENCOUNTER — Inpatient Hospital Stay: Payer: Medicare Other

## 2022-11-12 NOTE — Progress Notes (Signed)
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    Narrative:  I spoke with the patient to conduct her routine scheduled 3 month follow up visit to review her recent MRI brain results via telephone to spare the patient unnecessary potential exposure in the healthcare setting during the current COVID-19 pandemic.  The patient was notified in advance and gave permission to proceed with this visit format.  She tolerated her pre-op radiation treatment well and subsequently underwent surgical resection under the care of Dr. Ronnald Ramp on 01/11/2022 and has recovered well from her surgery.  She had a recent follow-up MRI brain scan on 11/07/2022 showing further evolution of posttreatment changes in the occipital lobe but no New or progressive lesions and no worrisome features.  We reviewed these results today.  She is off systemic therapy and being followed in observation only with Dr. Julien Nordmann. Her most recent restaging CT C/A/P from 11/01/22 was stable without any evidence of disease recurrence or progression. She will have a repeat CT C/A/P scan in 4 months, prior to her next scheduled follow up with Dr. Julien Nordmann on 02/26/23.  Overall, she is quite pleased with her progress to date.             On review of systems, the patient states that she is doing well in general.  She specifically denies headaches, nausea, vomiting, dizziness/imbalance or any changes in her visual or auditory  acuity.  Her hair continues to gradually fill in within the treatment field where it had thinned significantly.  She continues making progress in regards to her strength and activity level and denies any focal weakness or paraesthesias.    ALLERGIES:  has No Known Allergies.  Meds: Current Outpatient Medications  Medication Sig Dispense Refill   acetaminophen (TYLENOL) 325 MG tablet Take 1-2 tablets (325-650 mg total) by mouth every 4 (four) hours as needed for mild pain.     albuterol (PROVENTIL HFA;VENTOLIN HFA) 108 (90 Base) MCG/ACT inhaler Inhale 2 puffs into the lungs every 4 (four) hours as needed for wheezing or shortness of breath (cough, shortness of breath or wheezing.). 1 Inhaler 1   alendronate (FOSAMAX) 70 MG tablet Take 70 mg by mouth once a week.     Cholecalciferol (VITAMIN D3 PO) Take 2,000 Units by mouth daily.     fluticasone (FLONASE) 50 MCG/ACT nasal spray Place 2 sprays into both nostrils daily. 16 g 12   Fluticasone-Salmeterol (ADVAIR) 250-50 MCG/DOSE AEPB Inhale 1 puff into the lungs 2 (two) times daily.      levothyroxine (SYNTHROID) 75 MCG tablet TAKE 1 TABLET BY MOUTH EVERY DAY BEFORE BREAKFAST 30 tablet 2   Multiple Vitamin (MULTIVITAMIN) tablet Take 1 tablet by mouth daily.     No current facility-administered medications for this visit.    Physical Findings:  vitals were not taken for this visit.   /10 Unable to assess due to telephone follow-up visit format.  Lab Findings: Lab Results  Component Value Date   WBC 8.0 11/01/2022   HGB 13.5 11/01/2022   HCT 40.1 11/01/2022   MCV 93.7 11/01/2022   PLT 257 11/01/2022     Radiographic Findings: MR Brain W Wo Contrast  Result Date: 11/10/2022 CLINICAL DATA:  Brain metastases. EXAM: MRI HEAD WITHOUT AND WITH CONTRAST TECHNIQUE: Multiplanar, multiecho pulse sequences of the brain and surrounding structures were obtained without and with intravenous contrast. CONTRAST:  6 cc Vueway COMPARISON:  Brain MRI  08/07/2022 FINDINGS: Brain: Postsurgical changes reflecting right parietal craniotomy are again seen. A small amount of postsurgical blood products and a small extra-axial fluid collection measuring 3 mm are again seen underlying the craniotomy, similar to the prior study. There is postoperative dural thickening and enhancement which is also unchanged (13-87). Curvilinear enhancement measuring approximately 0.9 cm TV x 0.5 cm AP underlying the craniotomy is decreased from 1.1 cm x 0.6 cm (13-85). FLAIR signal abnormality underlying the craniotomy is unchanged. There is no new, progressive, or worrisome enhancement or FLAIR signal abnormality. There are no new enhancing lesions. There is no acute intracranial hemorrhage, extra-axial fluid collection, or acute infarct. Parenchymal volume is stable. The ventricles are stable in size, with unchanged ex vacuo dilatation of the right lateral ventricle. Scattered small foci of FLAIR signal abnormality in the remainder of the supratentorial white matter likely reflecting mild chronic small-vessel ischemic change are stable. The pituitary and suprasellar region are normal. There is no mass lesion. There is no mass effect or midline shift. Vascular: Normal flow voids. Skull and upper cervical spine: Normal marrow signal. Sinuses/Orbits: The paranasal sinuses are clear. Bilateral lens implants are in place. The globes and orbits are otherwise unremarkable. Other: None. IMPRESSION: 1. Evolving postsurgical changes in the right parietal lobe with decreased curvilinear enhancement underlying the craniotomy site. No new, progressive, or worrisome feature. 2. No new lesions. Electronically Signed   By: Valetta Mole M.D.   On: 11/10/2022 11:39   CT Chest W Contrast  Result Date: 11/02/2022 CLINICAL DATA:  Restaging non-small cell lung cancer. * Tracking Code: BO * EXAM: CT CHEST, ABDOMEN, AND PELVIS WITH CONTRAST TECHNIQUE: Multidetector CT imaging of the chest, abdomen and  pelvis was performed following the standard protocol during bolus administration of intravenous contrast. RADIATION DOSE REDUCTION: This exam was performed according to the departmental dose-optimization program which includes automated exposure control, adjustment of the mA and/or kV according to patient size and/or use of iterative reconstruction technique. CONTRAST:  187m OMNIPAQUE IOHEXOL 300 MG/ML  SOLN COMPARISON:  Multiple previous imaging studies. The most recent is 07/30/2022 FINDINGS: CT CHEST FINDINGS Cardiovascular: The heart is normal in size. No pericardial effusion. The aorta is normal in caliber. No dissection. Stable scattered atherosclerotic calcifications. Stable three-vessel coronary artery calcifications. The pulmonary arteries are unremarkable. Mediastinum/Nodes: No mediastinal or hilar mass or lymphadenopathy. The esophagus is unremarkable. Lungs/Pleura: Stable surgical changes from a right upper lobe lobectomy. No findings suspicious for recurrent tumor. No worrisome pulmonary nodules to suggest pulmonary metastatic disease. Stable underlying emphysematous changes and areas of pulmonary scarring. No pleural effusions or pleural nodules. Stable right basilar scarring changes. Musculoskeletal: No breast masses, supraclavicular or axillary adenopathy. The bony thorax is intact. No worrisome bone lesions. CT ABDOMEN PELVIS FINDINGS Hepatobiliary: A few tiny low-attenuation hepatic lesions are stable. No worrisome hepatic lesions. No findings to suggest hepatic metastatic disease. No intrahepatic biliary dilatation. Numerous gallstones are noted the gallbladder but no findings for acute cholecystitis. No common bile  duct dilatation. Pancreas: No mass, inflammation or ductal dilatation. Spleen: Normal size.  No lesions. Adrenals/Urinary Tract: The adrenal glands and kidneys are unremarkable and stable. The bladder is normal. Stomach/Bowel: The stomach, duodenum, small bowel and colon are  unremarkable. Scattered colonic diverticulosis but no findings for acute diverticulitis. The appendix is surgically absent. Vascular/Lymphatic: Stable advanced atherosclerotic calcification involving the aorta and branch vessels but no aneurysm or dissection. The major venous structures are patent. No mesenteric or retroperitoneal mass or adenopathy. Reproductive: The uterus and ovaries are unremarkable. Other: No pelvic mass or adenopathy. No free pelvic fluid collections. No inguinal mass or adenopathy. Stable small periumbilical abdominal wall hernia containing fat. Musculoskeletal: No significant bony findings. No worrisome bone lesions. Stable lumbar facet disease. IMPRESSION: 1. Stable surgical changes from a right upper lobe lobectomy. No findings suspicious for recurrent tumor or metastatic disease involving the chest, abdomen/pelvis or bony structures. 2. Stable emphysematous changes and pulmonary scarring. 3. Cholelithiasis. 4. Stable advanced atherosclerotic calcification involving the aorta and branch vessels including the coronary arteries. Aortic Atherosclerosis (ICD10-I70.0) and Emphysema (ICD10-J43.9). Electronically Signed   By: Marijo Sanes M.D.   On: 11/02/2022 10:46   CT Abdomen Pelvis W Contrast  Result Date: 11/02/2022 CLINICAL DATA:  Restaging non-small cell lung cancer. * Tracking Code: BO * EXAM: CT CHEST, ABDOMEN, AND PELVIS WITH CONTRAST TECHNIQUE: Multidetector CT imaging of the chest, abdomen and pelvis was performed following the standard protocol during bolus administration of intravenous contrast. RADIATION DOSE REDUCTION: This exam was performed according to the departmental dose-optimization program which includes automated exposure control, adjustment of the mA and/or kV according to patient size and/or use of iterative reconstruction technique. CONTRAST:  113m OMNIPAQUE IOHEXOL 300 MG/ML  SOLN COMPARISON:  Multiple previous imaging studies. The most recent is 07/30/2022  FINDINGS: CT CHEST FINDINGS Cardiovascular: The heart is normal in size. No pericardial effusion. The aorta is normal in caliber. No dissection. Stable scattered atherosclerotic calcifications. Stable three-vessel coronary artery calcifications. The pulmonary arteries are unremarkable. Mediastinum/Nodes: No mediastinal or hilar mass or lymphadenopathy. The esophagus is unremarkable. Lungs/Pleura: Stable surgical changes from a right upper lobe lobectomy. No findings suspicious for recurrent tumor. No worrisome pulmonary nodules to suggest pulmonary metastatic disease. Stable underlying emphysematous changes and areas of pulmonary scarring. No pleural effusions or pleural nodules. Stable right basilar scarring changes. Musculoskeletal: No breast masses, supraclavicular or axillary adenopathy. The bony thorax is intact. No worrisome bone lesions. CT ABDOMEN PELVIS FINDINGS Hepatobiliary: A few tiny low-attenuation hepatic lesions are stable. No worrisome hepatic lesions. No findings to suggest hepatic metastatic disease. No intrahepatic biliary dilatation. Numerous gallstones are noted the gallbladder but no findings for acute cholecystitis. No common bile duct dilatation. Pancreas: No mass, inflammation or ductal dilatation. Spleen: Normal size.  No lesions. Adrenals/Urinary Tract: The adrenal glands and kidneys are unremarkable and stable. The bladder is normal. Stomach/Bowel: The stomach, duodenum, small bowel and colon are unremarkable. Scattered colonic diverticulosis but no findings for acute diverticulitis. The appendix is surgically absent. Vascular/Lymphatic: Stable advanced atherosclerotic calcification involving the aorta and branch vessels but no aneurysm or dissection. The major venous structures are patent. No mesenteric or retroperitoneal mass or adenopathy. Reproductive: The uterus and ovaries are unremarkable. Other: No pelvic mass or adenopathy. No free pelvic fluid collections. No inguinal mass or  adenopathy. Stable small periumbilical abdominal wall hernia containing fat. Musculoskeletal: No significant bony findings. No worrisome bone lesions. Stable lumbar facet disease. IMPRESSION: 1. Stable surgical changes from a right upper lobe lobectomy.  No findings suspicious for recurrent tumor or metastatic disease involving the chest, abdomen/pelvis or bony structures. 2. Stable emphysematous changes and pulmonary scarring. 3. Cholelithiasis. 4. Stable advanced atherosclerotic calcification involving the aorta and branch vessels including the coronary arteries. Aortic Atherosclerosis (ICD10-I70.0) and Emphysema (ICD10-J43.9). Electronically Signed   By: Marijo Sanes M.D.   On: 11/02/2022 10:46    Impression/Plan: 1. 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 She appears to have recovered well from the effects of her preop SRS treatment and remains without complaints.  Her most recent restaging systemic imaging from 11/01/22 was without evidence of disease recurrence or progression. She saw Dr. Julien Nordmann on 11/05/22 and the current plan is to continue on observation only with a repeat CT chest scan in June 2024, prior to her next scheduled follow-up.  We reviewed the results of her recent follow-up MRI brain scan from 11/07/2022 showing further evolution of posttreatment changes in the occipital lobe but no new enhancing lesions. There was a small 10-12 mm rounded area of subdural enhancement that is slightly more conspicuous on this study but felt most likely to represent postoperative dural irregularity.  There were no suspicious diffusion changes and no other suspicious postcontrast enhancement.  We will continue with serial MRI brain scans every 3 months to monitor for any evidence of disease progression or recurrence and I will contact her by phone following each scan to review  the results and any recommendations from the multidisciplinary brain conference.  She appears to have a good understanding of her disease and our recommendations and is comfortable and in agreement with the stated plan.  She knows that she is welcome to call at anytime in the interim with any questions or concerns.  I personally spent 20 minutes in this encounter including chart review, reviewing radiological studies, telephone conversation with the patient, entering orders and completing documentation.     Nicholos Johns, PA-C

## 2022-11-13 ENCOUNTER — Ambulatory Visit
Admission: RE | Admit: 2022-11-13 | Discharge: 2022-11-13 | Disposition: A | Discharge status: Home / Self Care | Payer: Medicare Other | Source: Ambulatory Visit | Attending: Urology | Admitting: Urology

## 2022-11-13 ENCOUNTER — Encounter: Payer: Self-pay | Admitting: Urology

## 2022-11-13 DIAGNOSIS — C3490 Malignant neoplasm of unspecified part of unspecified bronchus or lung: Secondary | ICD-10-CM

## 2022-11-13 NOTE — Progress Notes (Signed)
Telephone nursing appointment for patient to review most recent MRI results from 11/07/22. I verified patient's identity and began nursing interview. Patient reports doing well.   Meaningful use complete.   Patient aware of their 8:30am-11/13/2022 telephone appointment w/ Ashlyn Bruning PA-C. I left my extension [phone] in case patient needs anything. Patient verbalized understanding. This concludes the nursing interview.   Patient contact [phone]     Leandra Kern, LPN

## 2022-12-07 ENCOUNTER — Other Ambulatory Visit: Payer: Self-pay | Admitting: Internal Medicine

## 2022-12-09 ENCOUNTER — Other Ambulatory Visit: Payer: Self-pay | Admitting: Radiation Therapy

## 2022-12-09 DIAGNOSIS — C7931 Secondary malignant neoplasm of brain: Secondary | ICD-10-CM

## 2022-12-10 ENCOUNTER — Other Ambulatory Visit: Payer: Self-pay | Admitting: Radiation Therapy

## 2022-12-10 ENCOUNTER — Telehealth: Payer: Self-pay | Admitting: Radiation Therapy

## 2022-12-10 NOTE — Telephone Encounter (Signed)
I spoke with pt about her upcoming brain MRI and telephone follow-up in May. During our conversation she shared that she is doing OK, but still processing the unexpected death of her husband in 10-24-22. I asked if she would like to speak with one of the cancer center's social workers or counselors. She said she is scheduled to see her PCP next week and plans to talk to her about everything, but if she decides to use this resource she will give me a call back for a referral.   Mont Dutton R.T.(R)(T) Radiation Special Procedures Navigator

## 2023-02-04 ENCOUNTER — Ambulatory Visit
Admission: RE | Admit: 2023-02-04 | Discharge: 2023-02-04 | Disposition: A | Discharge status: Home / Self Care | Payer: Medicare Other | Source: Ambulatory Visit | Attending: Radiation Oncology | Admitting: Radiation Oncology

## 2023-02-04 DIAGNOSIS — C7931 Secondary malignant neoplasm of brain: Secondary | ICD-10-CM

## 2023-02-04 MED ORDER — GADOPICLENOL 0.5 MMOL/ML IV SOLN
6.0000 mL | Freq: Once | INTRAVENOUS | Status: AC | PRN
  Administered 2023-02-04: 6 mL via INTRAVENOUS

## 2023-02-12 ENCOUNTER — Encounter: Payer: Self-pay | Admitting: Urology

## 2023-02-12 ENCOUNTER — Other Ambulatory Visit: Payer: Self-pay | Admitting: Radiation Therapy

## 2023-02-12 ENCOUNTER — Ambulatory Visit
Admission: RE | Admit: 2023-02-12 | Discharge: 2023-02-12 | Disposition: A | Discharge status: Home / Self Care | Payer: Medicare Other | Source: Ambulatory Visit | Attending: Urology | Admitting: Urology

## 2023-02-12 DIAGNOSIS — C3490 Malignant neoplasm of unspecified part of unspecified bronchus or lung: Secondary | ICD-10-CM

## 2023-02-12 DIAGNOSIS — C7931 Secondary malignant neoplasm of brain: Secondary | ICD-10-CM

## 2023-02-12 NOTE — Progress Notes (Signed)
Telephone nursing appointment for patient to review most recent MRI results from 02/04/23. Patient identity verified x2. Patient reports doing well. No issues conveyed at this time.   Meaningful use complete.   Patient aware of their 11:00am-02/12/2023 telephone appointment w/ Ashlyn Bruning PA-C. I left my extension [phone] in case patient needs anything. Patient verbalized understanding. This concludes the nursing interview.   Patient contact [phone]     Ruel Favors, LPN

## 2023-02-12 NOTE — Progress Notes (Signed)
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    Narrative:  I spoke with the patient to conduct her routine scheduled 3 month follow up visit to review her recent MRI brain results via telephone to spare the patient unnecessary potential exposure in the healthcare setting during the current COVID-19 pandemic.  The patient was notified in advance and gave permission to proceed with this visit format.  She tolerated her pre-op radiation treatment well and subsequently underwent surgical resection under the care of Dr. Yetta Barre on 01/11/2022 and has recovered well from her surgery.  She had a recent follow-up MRI brain scan on 02/04/2023 showing a stable appearance of posttreatment changes in the occipital lobe but no new or progressive lesions and no worrisome features.  We reviewed these results today.  She is off systemic therapy and being followed in observation only with Dr. Arbutus Ped. Her most recent restaging CT C/A/P from 11/01/22 was stable without any evidence of disease recurrence or progression. She will have a repeat CT C/A/P scan on 02/24/23, prior to her next scheduled follow up with Dr. Arbutus Ped on 02/26/23.  Overall, she is quite pleased with her progress to date.             On review of systems, the patient states that she is doing well in general although she is still deeply grieving the recent loss of her husband in Jan. 2024.  She specifically denies headaches,  nausea, vomiting, dizziness/imbalance or any changes in her visual or auditory acuity.  Her hair continues to gradually fill in within the treatment field where it had thinned significantly.  She continues making progress in regards to her strength and activity level and denies any focal weakness or paraesthesias.    ALLERGIES:  has No Known Allergies.  Meds: Current Outpatient Medications  Medication Sig Dispense Refill   acetaminophen (TYLENOL) 325 MG tablet Take 1-2 tablets (325-650 mg total) by mouth every 4 (four) hours as needed for mild pain.     albuterol (PROVENTIL HFA;VENTOLIN HFA) 108 (90 Base) MCG/ACT inhaler Inhale 2 puffs into the lungs every 4 (four) hours as needed for wheezing or shortness of breath (cough, shortness of breath or wheezing.). 1 Inhaler 1   alendronate (FOSAMAX) 70 MG tablet Take 70 mg by mouth once a week.     Cholecalciferol (VITAMIN D3 PO) Take 2,000 Units by mouth daily.     fluticasone (FLONASE) 50 MCG/ACT nasal spray Place 2 sprays into both nostrils daily. 16 g 12   Fluticasone-Salmeterol (ADVAIR) 250-50 MCG/DOSE AEPB Inhale 1 puff into the lungs 2 (two) times daily.      levothyroxine (SYNTHROID) 75 MCG tablet TAKE 1 TABLET BY MOUTH EVERY DAY BEFORE BREAKFAST 30 tablet 2   Multiple Vitamin (MULTIVITAMIN) tablet Take 1 tablet by mouth daily.     No current facility-administered medications for this encounter.    Physical Findings:  vitals were not taken for this  visit.  Pain Assessment Pain Score: 0-No pain/10 Unable to assess due to telephone follow-up visit format.  Lab Findings: Lab Results  Component Value Date   WBC 8.0 11/01/2022   HGB 13.5 11/01/2022   HCT 40.1 11/01/2022   MCV 93.7 11/01/2022   PLT 257 11/01/2022     Radiographic Findings: MR Brain W Wo Contrast  Result Date: 02/11/2023 CLINICAL DATA:  Brain metastases. Assess treatment response. Solitary right occipital lesion with excision in April of 2023. EXAM: MRI HEAD WITHOUT  AND WITH CONTRAST TECHNIQUE: Multiplanar, multiecho pulse sequences of the brain and surrounding structures were obtained without and with intravenous contrast. CONTRAST:  6 cc Vueway COMPARISON:  11/07/2022.  08/07/2022. FINDINGS: Brain: Diffusion imaging is negative. No abnormality affects the brainstem or cerebellum. Previous right occipital craniotomy for resection of a right occipital mass. Post treatment gliosis in the region is stable. Focal enhancement along the posterior margin of the dilated occipital horn is not progressive. No new foci of enhancement are present. Ventricular size is stable. No extra-axial collection. Vascular: Major vessels at the base of the brain show flow. Skull and upper cervical spine: Negative otherwise Sinuses/Orbits: Clear/normal Other: None IMPRESSION: Stable examination. Previous right occipital craniotomy for resection of a right occipital mass. Post treatment gliosis in the region is stable. Focal enhancement along the posterior margin of the dilated occipital horn at the site of the prior resection is stable. No new or progressive finding. Electronically Signed   By: Paulina Fusi M.D.   On: 02/11/2023 09:52    Impression/Plan: 1. 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 She appears to have recovered well from the effects of her preop SRS treatment and remains without complaints.  We reviewed the results of her recent follow-up MRI brain scan from 02/04/2023 showing a stable appearance of posttreatment changes in the occipital lobe but no new or progressive lesions and no worrisome features. Her most recent restaging systemic imaging from 11/01/22 was without evidence of disease recurrence or progression. She continues on observation only and is scheduled for a repeat CT chest scan on June 10,2024, prior to her next scheduled follow-up with  Dr. Arbutus Ped on 02/26/23. We will continue with serial MRI brain scans every 3 months to monitor for any evidence of disease progression or recurrence and I will contact her by phone following each scan to review the results and any recommendations from the multidisciplinary brain conference.  She appears to have a good understanding of her disease and our recommendations and is comfortable and in agreement with the stated plan.  She knows that she is welcome to call at anytime in the interim with any questions or concerns.  I personally spent 20 minutes in this encounter including chart review, reviewing radiological studies, telephone conversation with the patient, entering orders and completing documentation.     Marguarite Arbour, PA-C

## 2023-02-17 ENCOUNTER — Other Ambulatory Visit: Payer: Self-pay | Admitting: Radiation Therapy

## 2023-02-24 ENCOUNTER — Other Ambulatory Visit: Payer: Self-pay

## 2023-02-24 ENCOUNTER — Encounter (HOSPITAL_COMMUNITY): Payer: Self-pay

## 2023-02-24 ENCOUNTER — Ambulatory Visit (HOSPITAL_COMMUNITY)
Admission: RE | Admit: 2023-02-24 | Discharge: 2023-02-24 | Disposition: A | Discharge status: Home / Self Care | Payer: Medicare Other | Source: Ambulatory Visit | Attending: Physician Assistant | Admitting: Physician Assistant

## 2023-02-24 ENCOUNTER — Inpatient Hospital Stay: Payer: Medicare Other | Attending: Internal Medicine

## 2023-02-24 DIAGNOSIS — C3411 Malignant neoplasm of upper lobe, right bronchus or lung: Secondary | ICD-10-CM | POA: Insufficient documentation

## 2023-02-24 DIAGNOSIS — C7931 Secondary malignant neoplasm of brain: Secondary | ICD-10-CM | POA: Insufficient documentation

## 2023-02-24 DIAGNOSIS — C771 Secondary and unspecified malignant neoplasm of intrathoracic lymph nodes: Secondary | ICD-10-CM | POA: Insufficient documentation

## 2023-02-24 LAB — CBC WITH DIFFERENTIAL (CANCER CENTER ONLY)
Abs Immature Granulocytes: 0.03 10*3/uL (ref 0.00–0.07)
Basophils Absolute: 0 10*3/uL (ref 0.0–0.1)
Basophils Relative: 1 %
Eosinophils Absolute: 0 10*3/uL (ref 0.0–0.5)
Eosinophils Relative: 1 %
HCT: 38.5 % (ref 36.0–46.0)
Hemoglobin: 12.8 g/dL (ref 12.0–15.0)
Immature Granulocytes: 1 %
Lymphocytes Relative: 27 %
Lymphs Abs: 1.7 10*3/uL (ref 0.7–4.0)
MCH: 31.1 pg (ref 26.0–34.0)
MCHC: 33.2 g/dL (ref 30.0–36.0)
MCV: 93.7 fL (ref 80.0–100.0)
Monocytes Absolute: 0.5 10*3/uL (ref 0.1–1.0)
Monocytes Relative: 8 %
Neutro Abs: 4.1 10*3/uL (ref 1.7–7.7)
Neutrophils Relative %: 62 %
Platelet Count: 269 10*3/uL (ref 150–400)
RBC: 4.11 MIL/uL (ref 3.87–5.11)
RDW: 12.4 % (ref 11.5–15.5)
WBC Count: 6.4 10*3/uL (ref 4.0–10.5)
nRBC: 0 % (ref 0.0–0.2)

## 2023-02-24 LAB — CMP (CANCER CENTER ONLY)
ALT: 12 U/L (ref 0–44)
AST: 18 U/L (ref 15–41)
Albumin: 4.5 g/dL (ref 3.5–5.0)
Alkaline Phosphatase: 44 U/L (ref 38–126)
Anion gap: 6 (ref 5–15)
BUN: 8 mg/dL (ref 8–23)
CO2: 28 mmol/L (ref 22–32)
Calcium: 9.4 mg/dL (ref 8.9–10.3)
Chloride: 106 mmol/L (ref 98–111)
Creatinine: 0.59 mg/dL (ref 0.44–1.00)
GFR, Estimated: >60 mL/min (ref >60)
Glucose, Bld: 90 mg/dL (ref 70–99)
Potassium: 3.8 mmol/L (ref 3.5–5.1)
Sodium: 140 mmol/L (ref 135–145)
Total Bilirubin: 0.4 mg/dL (ref 0.3–1.2)
Total Protein: 7 g/dL (ref 6.5–8.1)

## 2023-02-24 MED ORDER — IOHEXOL 300 MG/ML  SOLN
75.0000 mL | Freq: Once | INTRAMUSCULAR | Status: AC | PRN
  Administered 2023-02-24: 75 mL via INTRAVENOUS

## 2023-02-24 MED ORDER — SODIUM CHLORIDE (PF) 0.9 % IJ SOLN
INTRAMUSCULAR | Status: AC
  Filled 2023-02-24: qty 50

## 2023-02-26 ENCOUNTER — Other Ambulatory Visit: Payer: Self-pay

## 2023-02-26 ENCOUNTER — Inpatient Hospital Stay: Payer: Medicare Other | Admitting: Internal Medicine

## 2023-02-26 VITALS — BP 115/85 | HR 74 | Temp 97.7°F | Resp 18 | Wt 114.4 lb

## 2023-02-26 DIAGNOSIS — C3490 Malignant neoplasm of unspecified part of unspecified bronchus or lung: Secondary | ICD-10-CM

## 2023-02-26 DIAGNOSIS — C7931 Secondary malignant neoplasm of brain: Secondary | ICD-10-CM | POA: Diagnosis not present

## 2023-02-26 DIAGNOSIS — C771 Secondary and unspecified malignant neoplasm of intrathoracic lymph nodes: Secondary | ICD-10-CM | POA: Diagnosis not present

## 2023-02-26 DIAGNOSIS — C3411 Malignant neoplasm of upper lobe, right bronchus or lung: Secondary | ICD-10-CM | POA: Diagnosis not present

## 2023-02-26 NOTE — Progress Notes (Signed)
[facility] [phone]   [phone]  OFFICE PROGRESS NOTE  Porfirio Oar, PA [address] [place] [address]  DIAGNOSIS: Metastatic non-small cell lung cancer initially diagnosed as stage IIIA (T1b, N2, M0) non-small cell lung cancer, adenocarcinoma presented with right upper lobe lung nodule as well as right upper paratracheal adenopathy and suspicious tiny nodule in the right middle lobe.  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  First dose 05/30/2020. Status post 3 cycles. 2) status post robotic assisted right thoracoscopy with right upper lobectomy and lymph node dissection under the care of Dr. Dorris Fetch on October 19, 2020.  There was residual tumor measuring 0.5 cm with lymph node metastasis involving 4R, 12 and hilar lymph nodes. 3) Adjuvant systemic chemotherapy with carboplatin for AUC of 5 and Alimta 500 Mg/M2 every 3 weeks.  First dose was given December 11, 2020.  Status post 4 cycles.  Last dose of chemotherapy was given Feb 13, 2021. 4) status post right posterior parietal craniotomy with resection of the brain mass under the care of Dr. Marikay Alar followed by [facility] to the solitary brain metastasis under the care of Dr. Kathrynn Running.  The final pathology was consistent with metastatic adenocarcinoma of lung primary. 5) Adjuvant immunotherapy with Atezolizumab 1200 Mg IV every 3 weeks.  First dose March 27, 2021.  Status post 11 cycles.  Last cycle was given on November 12, 2021 this treatment was discontinued secondary to disease progression in the brain.    CURRENT THERAPY: Observation  INTERVAL HISTORY: [name] 72 y.o. female returns to the clinic today for follow-up visit.  The patient is feeling fine today with no  concerning complaints.  She denied having any current chest pain, shortness of breath, cough or hemoptysis.  She has no nausea, vomiting, diarrhea or constipation.  She has no headache or visual changes.  She denied having any fever or chills.  She is here today for evaluation with repeat CT scan of the chest for restaging of her disease.  MEDICAL HISTORY: 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    ALLERGIES:  has No Known Allergies.  MEDICATIONS:  Current Outpatient Medications  Medication Sig Dispense Refill   acetaminophen (TYLENOL) 325 MG tablet Take 1-2 tablets (325-650 mg total) by mouth every 4 (four) hours as needed for mild pain.     albuterol (PROVENTIL HFA;VENTOLIN HFA) 108 (90 Base) MCG/ACT inhaler Inhale 2 puffs into the lungs every 4 (four) hours as needed for wheezing or shortness of breath (cough, shortness of breath or wheezing.). 1 Inhaler 1   alendronate (FOSAMAX) 70 MG tablet Take 70 mg by mouth once a week.     Cholecalciferol (VITAMIN D3 PO) Take 2,000 Units by mouth daily.     fluticasone (FLONASE) 50 MCG/ACT nasal spray Place 2 sprays into both nostrils daily. 16 g 12   Fluticasone-Salmeterol (ADVAIR) 250-50 MCG/DOSE AEPB Inhale 1 puff into the lungs 2 (two) times daily.      levothyroxine (SYNTHROID) 75 MCG tablet TAKE 1 TABLET BY MOUTH EVERY DAY BEFORE BREAKFAST 30 tablet 2   Multiple Vitamin (MULTIVITAMIN) tablet Take 1 tablet  by mouth daily.     No current facility-administered medications for this visit.    SURGICAL HISTORY:  Past Surgical History:  Procedure Laterality Date   APPENDECTOMY  2008   APPLICATION OF CRANIAL NAVIGATION Right 01/11/2022   Procedure: APPLICATION OF CRANIAL NAVIGATION;  Surgeon: Tia Alert, MD;  Location: [facility] OR;  Service: Neurosurgery;  Laterality: Right;   COLON SURGERY  2008   colostomy-infection post op appendectomy    COLOSTOMY REVERSAL  2009   CRANIOTOMY Right 01/11/2022   Procedure: RIGHT CRANIOTOMY FOR TUMOR RESECTION WITH BRAIN LAB;  Surgeon: Tia Alert, MD;  Location: [facility] OR;  Service: Neurosurgery;  Laterality: Right;   INTERCOSTAL NERVE BLOCK Right 10/19/2020   Procedure: INTERCOSTAL NERVE BLOCK;  Surgeon: Loreli Slot, MD;  Location: [facility] OR;  Service: Thoracic;  Laterality: Right;   NODE DISSECTION Right 10/19/2020   Procedure: NODE DISSECTION;  Surgeon: Loreli Slot, MD;  Location: [facility] OR;  Service: Thoracic;  Laterality: Right;   VIDEO BRONCHOSCOPY WITH ENDOBRONCHIAL NAVIGATION N/A 05/08/2020   Procedure: VIDEO BRONCHOSCOPY WITH ENDOBRONCHIAL NAVIGATION;  Surgeon: Loreli Slot, MD;  Location: [facility] OR;  Service: Thoracic;  Laterality: N/A;   VIDEO BRONCHOSCOPY WITH ENDOBRONCHIAL ULTRASOUND N/A 05/08/2020   Procedure: VIDEO BRONCHOSCOPY WITH ENDOBRONCHIAL ULTRASOUND;  Surgeon: Loreli Slot, MD;  Location: [facility] OR;  Service: Thoracic;  Laterality: N/A;    REVIEW OF SYSTEMS:  A comprehensive review of systems was negative.   PHYSICAL EXAMINATION: General appearance: alert, cooperative, and no distress Head: Normocephalic, without obvious abnormality, atraumatic Neck: no adenopathy, no JVD, supple, symmetrical, trachea midline, and thyroid not enlarged, symmetric, no tenderness/mass/nodules Lymph nodes: Cervical, supraclavicular, and axillary nodes normal. Resp: clear to auscultation bilaterally Back: symmetric, no curvature. ROM normal. No CVA tenderness. Cardio: regular rate and rhythm, S1, S2 normal, no murmur, click, rub or gallop GI: soft, non-tender; bowel sounds normal; no masses,  no organomegaly Extremities: extremities normal, atraumatic, no cyanosis or edema  ECOG PERFORMANCE STATUS: 1 - Symptomatic but completely ambulatory  Blood pressure 115/85, pulse 74, temperature 97.7 F (36.5 C), temperature source Oral, resp. rate 18, weight 114 lb 6.4 oz (51.9 kg),  SpO2 96 %.  LABORATORY DATA: Lab Results  Component Value Date   WBC 6.4 02/24/2023   HGB 12.8 02/24/2023   HCT 38.5 02/24/2023   MCV 93.7 02/24/2023   PLT 269 02/24/2023      Chemistry      Component Value Date/Time   NA 140 02/24/2023 1122   NA 135 10/29/2017 1131   K 3.8 02/24/2023 1122   CL 106 02/24/2023 1122   CO2 28 02/24/2023 1122   BUN 8 02/24/2023 1122   BUN 5 (L) 10/29/2017 1131   CREATININE 0.59 02/24/2023 1122      Component Value Date/Time   CALCIUM 9.4 02/24/2023 1122   ALKPHOS 44 02/24/2023 1122   AST 18 02/24/2023 1122   ALT 12 02/24/2023 1122   BILITOT 0.4 02/24/2023 1122       RADIOGRAPHIC STUDIES: CT Chest W Contrast  Result Date: 02/24/2023 CLINICAL DATA:  Non-small-cell lung cancer with brain metastasis. Asymptomatic. Chemotherapy and radiation therapy complete in 2023. Immunotherapy completed 2023. * Tracking Code: BO * EXAM: CT CHEST WITH CONTRAST TECHNIQUE: Multidetector CT imaging of the chest was performed during intravenous contrast administration. RADIATION DOSE REDUCTION: This exam was performed according to the departmental dose-optimization program which includes automated exposure control, adjustment of the mA and/or kV according to patient size and/or use  of iterative reconstruction technique. CONTRAST:  75mL OMNIPAQUE IOHEXOL 300 MG/ML  SOLN COMPARISON:  11/01/2022 FINDINGS: Cardiovascular: Aortic atherosclerosis. Normal heart size, without pericardial effusion. Three vessel coronary artery calcification. No central pulmonary embolism, on this non-dedicated study. Mediastinum/Nodes: No supraclavicular adenopathy. No mediastinal or hilar adenopathy. Lungs/Pleura: No pleural fluid. Mild-to-moderate centrilobular emphysema. Right upper lobectomy. Upper Abdomen: Gallstones. Normal imaged portions of the liver, spleen, stomach, pancreas, adrenal glands, kidneys. Musculoskeletal: Right posterior eleventh rib fracture is chronic but nonunited on 135/2.  IMPRESSION: 1. Status post right upper lobectomy, without recurrent or metastatic disease. 2. Aortic atherosclerosis (ICD10-I70.0), coronary artery atherosclerosis and emphysema (ICD10-J43.9). 3. Cholelithiasis Electronically Signed   By: Jeronimo Greaves M.D.   On: 02/24/2023 14:14   MR Brain W Wo Contrast  Result Date: 02/11/2023 CLINICAL DATA:  Brain metastases. Assess treatment response. Solitary right occipital lesion with excision in April of 2023. EXAM: MRI HEAD WITHOUT AND WITH CONTRAST TECHNIQUE: Multiplanar, multiecho pulse sequences of the brain and surrounding structures were obtained without and with intravenous contrast. CONTRAST:  6 cc Vueway COMPARISON:  11/07/2022.  08/07/2022. FINDINGS: Brain: Diffusion imaging is negative. No abnormality affects the brainstem or cerebellum. Previous right occipital craniotomy for resection of a right occipital mass. Post treatment gliosis in the region is stable. Focal enhancement along the posterior margin of the dilated occipital horn is not progressive. No new foci of enhancement are present. Ventricular size is stable. No extra-axial collection. Vascular: Major vessels at the base of the brain show flow. Skull and upper cervical spine: Negative otherwise Sinuses/Orbits: Clear/normal Other: None IMPRESSION: Stable examination. Previous right occipital craniotomy for resection of a right occipital mass. Post treatment gliosis in the region is stable. Focal enhancement along the posterior margin of the dilated occipital horn at the site of the prior resection is stable. No new or progressive finding. Electronically Signed   By: Paulina Fusi M.D.   On: 02/11/2023 09:52    ASSESSMENT AND PLAN: This is a very pleasant 72 years old white female with metastatic non-small cell lung cancer, adenocarcinoma that was initially diagnosed as stage IIIa (T1b, N2, M0) non-small cell lung cancer, adenocarcinoma presented with right upper lobe lung nodule in addition to  mediastinal lymphadenopathy.  PD-L1 expression 90%.  The patient has evidence for metastatic disease with solitary brain metastasis in April 2023 The patient underwent a course of neoadjuvant treatment with carboplatin for AUC of 5, Alimta 500 mg/M2 and Keytruda 200 mg IV every 3 weeks for 3 cycles.   She tolerated this treatment well with no concerning adverse effect except for dry skin and itching. Her repeat CT scan after the neoadjuvant treatment showed partial response with around 50% reduction in the tumor volume. The patient underwent right upper lobectomy with lymph node dissection on October 19, 2020 and the final pathology showed residual tumor measuring 0.5 cm with lymph node metastasis involving hilar and mediastinal lymph nodes. She underwent adjuvant systemic chemotherapy with carboplatin for AUC of 5, Alimta 500 mg/M2 every 3 weeks since she has good response to this treatment in the past.  She is status post 4 cycles.  She tolerated this treatment well with no concerning adverse effect except for mild fatigue. She underwent adjuvant immunotherapy with Tecentriq 1200 Mg IV every 3 weeks status post 12 cycles.  Last dose was given on November 12, 2021 discontinued after the patient develop metastatic brain lesions and she has been off treatment since her diagnosis with the brain metastasis.. The patient underwent craniotomy  with resection of the solitary brain metastasis followed by SRS to the surgical cavity. The patient has been on observation since that time and she is feeling fine with no concerning complaints. She had repeat CT scan of the chest performed recently.  I personally and independently reviewed the scan and discussed the result with the patient today..  Her scan showed no concerning findings for disease recurrence or metastasis. I recommended for her to continue on observation with repeat CT scan of the chest in 6 months. The patient was advised to call immediately if she has  any concerning symptoms in the interval. The patient voices understanding of current disease status and treatment options and is in agreement with the current care plan.  All questions were answered. The patient knows to call the clinic with any problems, questions or concerns. We can certainly see the patient much sooner if necessary. The total time spent in the appointment was 20 minutes.   Disclaimer: This note was dictated with voice recognition software. Similar sounding words can inadvertently be transcribed and may not be corrected upon review.

## 2023-03-14 ENCOUNTER — Other Ambulatory Visit: Payer: Self-pay | Admitting: Internal Medicine

## 2023-05-06 ENCOUNTER — Ambulatory Visit
Admission: RE | Admit: 2023-05-06 | Discharge: 2023-05-06 | Disposition: A | Discharge status: Home / Self Care | Payer: Medicare Other | Source: Ambulatory Visit | Attending: Radiation Oncology | Admitting: Radiation Oncology

## 2023-05-06 DIAGNOSIS — C7931 Secondary malignant neoplasm of brain: Secondary | ICD-10-CM

## 2023-05-06 MED ORDER — GADOPICLENOL 0.5 MMOL/ML IV SOLN
6.0000 mL | Freq: Once | INTRAVENOUS | Status: AC | PRN
  Administered 2023-05-06: 6 mL via INTRAVENOUS

## 2023-05-12 ENCOUNTER — Encounter: Payer: Self-pay | Admitting: Radiology

## 2023-05-12 NOTE — Progress Notes (Signed)
Radiation Oncology         [phone] ________________________________  Name: [name] MRN: [id number]  Date: 05/12/2023  DOB: [birth year]  Post Treatment Note  CC: Porfirio Oar, PA  No ref. provider found  Diagnosis:   72yo woman with h/o a 3.5 cm solitary right occipital brain metastasis from Stage T1b N2 M0 adenocarcinoma of the right upper lung with 90% PD-L1 expression on observation.   Interval Since Last Radiation:  1 year and 4 months 01/03/22, 01/07/22, 01/09/22: Fractionated Pre-OP SRS PTV1: The right occipital 35 mm target was treated 24 Gy in 3 fractions of 8 Gy each.    Narrative:  I spoke with the patient to conduct her routine scheduled 3 month follow up visit to review her recent MRI brain results via telephone to spare the patient unnecessary potential exposure in the healthcare setting during the current COVID-19 pandemic.  The patient was notified in advance and gave permission to proceed with this visit format.   She tolerated her pre-op radiation treatment well and subsequently underwent surgical resection under the care of Dr. Yetta Barre on 01/11/2022 and has recovered well from her surgery.   She is off systemic therapy and being followed in observation only with Dr. Arbutus Ped. Her most recent restaging CT C/A/P from 02/24/23 was stable without any evidence of disease recurrence or progression. She will have a repeat CT C/A/P scan in December, prior to her next scheduled follow up with Dr. Arbutus Ped on 08/25/23. Overall, she is quite pleased with her progress to date.  Most recent brain MRI on 05/09/2023 shows continued decrease in size of nodular enhancement at the operative bed, likely reflecting evolving post treatment changes. No new or progressive findings concerning for disease recurrence or progression were seen. We reviewed these results today.  On review of systems, the patient states that she is doing well in general although she is still deeply grieving the  recent loss of her husband in Jan. 2024.  She specifically denies headaches, nausea, vomiting, dizziness/imbalance or any changes in her visual or auditory acuity.  Her hair grown back within the treatment field where it had thinned significantly.  She continues making progress in regards to her strength and activity level and denies any focal weakness or paraesthesias.   ALLERGIES:  has No Known Allergies.  Meds: Current Outpatient Medications  Medication Sig Dispense Refill   acetaminophen (TYLENOL) 325 MG tablet Take 1-2 tablets (325-650 mg total) by mouth every 4 (four) hours as needed for mild pain.     albuterol (PROVENTIL HFA;VENTOLIN HFA) 108 (90 Base) MCG/ACT inhaler Inhale 2 puffs into the lungs every 4 (four) hours as needed for wheezing or shortness of breath (cough, shortness of breath or wheezing.). 1 Inhaler 1   alendronate (FOSAMAX) 70 MG tablet Take 70 mg by mouth once a week.     Cholecalciferol (VITAMIN D3 PO) Take 2,000 Units by mouth daily.     fluticasone (FLONASE) 50 MCG/ACT nasal spray Place 2 sprays into both nostrils daily. 16 g 12   Fluticasone-Salmeterol (ADVAIR) 250-50 MCG/DOSE AEPB Inhale 1 puff into the lungs 2 (two) times daily.      levothyroxine (SYNTHROID) 75 MCG tablet TAKE 1 TABLET BY MOUTH EVERY DAY BEFORE BREAKFAST 30 tablet 2   Multiple Vitamin (MULTIVITAMIN) tablet Take 1 tablet by mouth daily.     No current facility-administered medications for this visit.    Physical Findings:  vitals were not taken for this visit.   /10 Unable  to assess due to telephone follow-up visit format.  Lab Findings: Lab Results  Component Value Date   WBC 6.4 02/24/2023   HGB 12.8 02/24/2023   HCT 38.5 02/24/2023   MCV 93.7 02/24/2023   PLT 269 02/24/2023     Radiographic Findings: MR Brain W Wo Contrast  Result Date: 05/09/2023 CLINICAL DATA:  Brain metastases. Assess treatment response. Non-small cell lung cancer. EXAM: MRI HEAD WITHOUT AND WITH CONTRAST  TECHNIQUE: Multiplanar, multiecho pulse sequences of the brain and surrounding structures were obtained without and with intravenous contrast. CONTRAST:  6 mL Vueway COMPARISON:  MR head without and with contrast 02/04/2023 and 11/07/2022. FINDINGS: Brain: No acute infarct, hemorrhage, or mass lesion is present. Right occipital craniotomy resection of tumor is again noted. Mild dural enhancement is stable. Slight nodular enhancement at the operative bed continues to decrease. Maximal measurement is now 4 mm. Encephalomalacia is present adjacent to the operative site. Scattered subcortical T2 hyperintensities are present bilaterally right greater than left. Have not changed significantly Ex vacuo dilation of the posterior horn of the right lateral ventricle is noted. Ventricles are otherwise normal in size. Deep brain nuclei are within normal limits. No significant extraaxial fluid collection is present. Lacunar infarcts are present in the cerebellum bilaterally. Brainstem and cerebellum are otherwise within limits. Insert The internal auditory canals are within normal limits. The postcontrast images demonstrate no other pathologic enhancement. Vascular: Flow is present in the major intracranial arteries. Skull and upper cervical spine: The craniocervical junction is normal. Upper cervical spine is within normal limits. Marrow signal is unremarkable. Sinuses/Orbits: The paranasal sinuses and mastoid air cells are clear. Bilateral lens replacements are noted. Globes and orbits are otherwise unremarkable. IMPRESSION: 1. Continued decrease in size of nodular enhancement at the operative bed. This likely reflects evolving post treatment changes. 2. No new or progressive lesions. 3. Stable atrophy and white matter disease. This likely reflects the sequela of chronic microvascular ischemia. 4. Lacunar infarcts of the cerebellum bilaterally. Electronically Signed   By: Marin Roberts M.D.   On: 05/09/2023 15:42     Impression/Plan: 1. 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  She has recovered well from the effects of her preop SRS treatment and remains without complaints.  We reviewed the results of her recent follow-up MRI brain scan from 05/09/2023 showing continued decrease in size of nodular enhancement at the operative bed, likely reflecting evolving post treatment changes. No new or progressive findings concerning for disease recurrence or progression were seen. Her most recent restaging systemic imaging from 02/24/23 was without evidence of disease recurrence or progression. She continues on observation only and is scheduled for a repeat CT chest scan in December 2024, prior to her next scheduled follow-up with Dr. Arbutus Ped on 08/25/2023. We will continue with serial MRI brain scans every 3 months to monitor for any evidence of disease progression or recurrence and I will contact her by phone following each scan to review the results and any recommendations from the multidisciplinary brain conference.  She appears to have a good understanding of her disease and our recommendations and is comfortable and in agreement with the stated plan.  She knows that she is welcome to call at anytime in the interim with any questions or concerns.  I personally spent 20 minutes in this encounter including chart review, reviewing radiological studies, telephone conversation  with the patient, entering orders and completing documentation.    Joyice Faster, PA-C

## 2023-05-13 ENCOUNTER — Other Ambulatory Visit: Payer: Self-pay | Admitting: Radiation Therapy

## 2023-05-13 DIAGNOSIS — C7931 Secondary malignant neoplasm of brain: Secondary | ICD-10-CM

## 2023-06-05 ENCOUNTER — Telehealth: Payer: Self-pay | Admitting: Radiation Therapy

## 2023-06-05 NOTE — Telephone Encounter (Signed)
Called, no answer, left a detailed voicemail about the upcoming brain MRI and telephone follow-up appointments scheduled. My contact information was included with a request to call back if she has questions or concerns about these visits.   Jalene Mullet R.T.(R)(T) Radiation Special Procedures Navigator

## 2023-06-13 ENCOUNTER — Other Ambulatory Visit: Payer: Self-pay | Admitting: Internal Medicine

## 2023-08-06 ENCOUNTER — Ambulatory Visit
Admission: RE | Admit: 2023-08-06 | Discharge: 2023-08-06 | Disposition: A | Discharge status: Home / Self Care | Payer: Medicare Other | Source: Ambulatory Visit | Attending: Radiation Oncology | Admitting: Radiation Oncology

## 2023-08-06 DIAGNOSIS — C7931 Secondary malignant neoplasm of brain: Secondary | ICD-10-CM

## 2023-08-06 MED ORDER — GADOPICLENOL 0.5 MMOL/ML IV SOLN
5.0000 mL | Freq: Once | INTRAVENOUS | Status: AC | PRN
  Administered 2023-08-06: 5 mL via INTRAVENOUS

## 2023-08-06 NOTE — Progress Notes (Signed)
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   Interval Since Last Radiation:  1 year and 4 months 01/03/22, 01/07/22, 01/09/22: Fractionated Pre-OP SRS PTV1: The right occipital 35 mm target was treated 24 Gy in 3 fractions of 8 Gy each.    Narrative:  I spoke with the patient to conduct her routine scheduled 3 month follow up visit to review her recent MRI brain results via telephone to spare the patient unnecessary potential exposure in the healthcare setting during the current COVID-19 pandemic.  The patient was notified in advance and gave permission to proceed with this visit format.   She tolerated her pre-op radiation treatment well and subsequently underwent surgical resection under the care of Dr. Yetta Barre on 01/11/2022 and has recovered well from her surgery.   She is off systemic therapy and being followed in observation only with Dr. Arbutus Ped. Her most recent restaging CT C/A/P from 02/24/23 was stable without any evidence of disease recurrence or progression. She will have a repeat CT C/A/P scan in December, prior to her next scheduled follow up with Dr. Arbutus Ped on 08/25/23. Overall, she is quite pleased with her progress to date.  Most recent brain MRI on 08/06/2023 demonstrates minimal contrast enhancement adjacent to the right parietal craniotomy site and along the ependymal margin of the right occipital horn.   On review of systems, the patient states that she is doing well in general.  She specifically denies headaches, nausea, vomiting, dizziness/imbalance or any changes in her visual or auditory acuity.   ALLERGIES:  has No  Known Allergies.  Meds: Current Outpatient Medications  Medication Sig Dispense Refill   acetaminophen (TYLENOL) 325 MG tablet Take 1-2 tablets (325-650 mg total) by mouth every 4 (four) hours as needed for mild pain.     albuterol (PROVENTIL HFA;VENTOLIN HFA) 108 (90 Base) MCG/ACT inhaler Inhale 2 puffs into the lungs every 4 (four) hours as needed for wheezing or shortness of breath (cough, shortness of breath or wheezing.). 1 Inhaler 1   alendronate (FOSAMAX) 70 MG tablet Take 70 mg by mouth once a week.     Cholecalciferol (VITAMIN D3 PO) Take 2,000 Units by mouth daily.     fluticasone (FLONASE) 50 MCG/ACT nasal spray Place 2 sprays into both nostrils daily. 16 g 12   Fluticasone-Salmeterol (ADVAIR) 250-50 MCG/DOSE AEPB Inhale 1 puff into the lungs 2 (two) times daily.      levothyroxine (SYNTHROID) 75 MCG tablet TAKE 1 TABLET BY MOUTH EVERY DAY BEFORE BREAKFAST 30 tablet 2   Multiple Vitamin (MULTIVITAMIN) tablet Take 1 tablet by mouth daily.     No current facility-administered medications for this encounter.    Physical Findings:  vitals were not taken for this visit.  Pain Assessment Pain Score: 0-No pain/10 Unable to assess due to telephone follow-up visit format.  Lab Findings: Lab Results  Component Value Date   WBC 6.4 02/24/2023   HGB 12.8 02/24/2023   HCT 38.5 02/24/2023   MCV 93.7 02/24/2023   PLT 269 02/24/2023     Radiographic Findings: MR Brain W Wo Contrast  Result Date: 08/06/2023 CLINICAL DATA:  Brain metastases, assess treatment response 3T SRS Protocol EXAM: MRI HEAD WITHOUT AND WITH CONTRAST TECHNIQUE: Multiplanar, multiecho pulse sequences of the brain and surrounding structures were obtained without and with intravenous contrast. CONTRAST:  2.5 ml Vueway COMPARISON:  Brain MR 05/06/23 FINDINGS: Brain: Negative for an acute infarct. No hemorrhage. No hydrocephalus. No extra-axial fluid collection. No mass effect. There is background of mild chronic  microvascular ischemic change. Postsurgical changes from right parietal craniotomy with unchanged minimal contrast enhancement adjacent to the craniotomy site. And along the ependymal margin of the right occipital horn (series 14, image 24). No new contrast-enhancing lesions are visualized. T2/stir hyperintense signal abnormality in the region is unchanged from prior exam. Vascular: Normal flow voids. Skull and upper cervical spine: Normal marrow signal. Sinuses/Orbits: No middle ear or mastoid effusion. Paranasal sinuses are clear. Bilateral lens replacement. Orbits are otherwise unremarkable. Other: None. IMPRESSION: Unchanged minimal contrast enhancement adjacent to the right parietal craniotomy site and along the ependymal margin of the right occipital horn. No new contrast-enhancing lesions are visualized. Electronically Signed   By: Lorenza Cambridge M.D.   On: 08/06/2023 15:09    Impression/Plan: 1. 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  She has recovered well from the effects of her preop SRS treatment and remains without complaints. We reviewed the results of her recent follow-up MRI brain scan from 08/06/2023 showing a new area of enhancement near the resection cavity. We reviewed her case at our tumor board and the consensus was to proceed with follow-up imaging in 2 months.  Her most recent restaging systemic imaging from 02/24/23 was without evidence of disease recurrence or progression. She continues on observation only and is scheduled for a repeat CT chest scan in December 2024, prior to her next scheduled follow-up with Dr. Arbutus Ped on 08/25/2023.   Patient appears to have a good understanding of her disease and our recommendations and is comfortable and in agreement with the stated plan. She knows that she is welcome to call at anytime in the interim with any  questions or concerns.  I personally spent 20 minutes in this encounter including chart review, reviewing radiological studies, telephone conversation with the patient, entering orders and completing documentation.    Joyice Faster, PA-C

## 2023-08-07 ENCOUNTER — Other Ambulatory Visit: Payer: Self-pay

## 2023-08-11 ENCOUNTER — Other Ambulatory Visit: Payer: Self-pay | Admitting: Radiation Therapy

## 2023-08-11 ENCOUNTER — Inpatient Hospital Stay: Payer: Medicare Other | Attending: Internal Medicine

## 2023-08-11 ENCOUNTER — Ambulatory Visit
Admission: RE | Admit: 2023-08-11 | Discharge: 2023-08-11 | Disposition: A | Discharge status: Home / Self Care | Payer: Medicare Other | Source: Ambulatory Visit | Attending: Radiology | Admitting: Radiology

## 2023-08-11 ENCOUNTER — Encounter: Payer: Self-pay | Admitting: Radiology

## 2023-08-11 DIAGNOSIS — C7931 Secondary malignant neoplasm of brain: Secondary | ICD-10-CM

## 2023-08-11 DIAGNOSIS — C3490 Malignant neoplasm of unspecified part of unspecified bronchus or lung: Secondary | ICD-10-CM

## 2023-08-11 NOTE — Progress Notes (Signed)
[name] is here today for follow up post radiation to the brain.    They completed their radiation on: 01/09/2022  Does the patient complain of any of the following: Headache: No Visual Changes: No Hearing Changes: No Nausea: No Vomiting: No Balance or coordination issues: No Memory issues: No  Is the patient currently on a Decadron regimen? : No

## 2023-08-22 ENCOUNTER — Inpatient Hospital Stay: Payer: Medicare Other | Attending: Internal Medicine

## 2023-08-22 ENCOUNTER — Other Ambulatory Visit: Payer: Self-pay

## 2023-08-22 ENCOUNTER — Ambulatory Visit (HOSPITAL_COMMUNITY)
Admission: RE | Admit: 2023-08-22 | Discharge: 2023-08-22 | Disposition: A | Discharge status: Home / Self Care | Payer: Medicare Other | Source: Ambulatory Visit | Attending: Internal Medicine | Admitting: Internal Medicine

## 2023-08-22 DIAGNOSIS — C7931 Secondary malignant neoplasm of brain: Secondary | ICD-10-CM | POA: Insufficient documentation

## 2023-08-22 DIAGNOSIS — C3490 Malignant neoplasm of unspecified part of unspecified bronchus or lung: Secondary | ICD-10-CM

## 2023-08-22 DIAGNOSIS — C3411 Malignant neoplasm of upper lobe, right bronchus or lung: Secondary | ICD-10-CM | POA: Insufficient documentation

## 2023-08-22 LAB — CBC WITH DIFFERENTIAL (CANCER CENTER ONLY)
Abs Immature Granulocytes: 0.02 10*3/uL (ref 0.00–0.07)
Basophils Absolute: 0 10*3/uL (ref 0.0–0.1)
Basophils Relative: 0 %
Eosinophils Absolute: 0.1 10*3/uL (ref 0.0–0.5)
Eosinophils Relative: 1 %
HCT: 42.1 % (ref 36.0–46.0)
Hemoglobin: 13.6 g/dL (ref 12.0–15.0)
Immature Granulocytes: 0 %
Lymphocytes Relative: 23 %
Lymphs Abs: 1.7 10*3/uL (ref 0.7–4.0)
MCH: 31 pg (ref 26.0–34.0)
MCHC: 32.3 g/dL (ref 30.0–36.0)
MCV: 95.9 fL (ref 80.0–100.0)
Monocytes Absolute: 0.6 10*3/uL (ref 0.1–1.0)
Monocytes Relative: 7 %
Neutro Abs: 5.1 10*3/uL (ref 1.7–7.7)
Neutrophils Relative %: 69 %
Platelet Count: 275 10*3/uL (ref 150–400)
RBC: 4.39 MIL/uL (ref 3.87–5.11)
RDW: 12.2 % (ref 11.5–15.5)
WBC Count: 7.4 10*3/uL (ref 4.0–10.5)
nRBC: 0 % (ref 0.0–0.2)

## 2023-08-22 LAB — CMP (CANCER CENTER ONLY)
ALT: 13 U/L (ref 0–44)
AST: 20 U/L (ref 15–41)
Albumin: 4.5 g/dL (ref 3.5–5.0)
Alkaline Phosphatase: 60 U/L (ref 38–126)
Anion gap: 7 (ref 5–15)
BUN: 5 mg/dL — ABNORMAL LOW (ref 8–23)
CO2: 29 mmol/L (ref 22–32)
Calcium: 9.7 mg/dL (ref 8.9–10.3)
Chloride: 104 mmol/L (ref 98–111)
Creatinine: 0.55 mg/dL (ref 0.44–1.00)
GFR, Estimated: >60 mL/min (ref >60)
Glucose, Bld: 83 mg/dL (ref 70–99)
Potassium: 3.8 mmol/L (ref 3.5–5.1)
Sodium: 140 mmol/L (ref 135–145)
Total Bilirubin: 0.3 mg/dL (ref <1.2)
Total Protein: 6.8 g/dL (ref 6.5–8.1)

## 2023-08-22 MED ORDER — IOHEXOL 300 MG/ML  SOLN
75.0000 mL | Freq: Once | INTRAMUSCULAR | Status: AC | PRN
  Administered 2023-08-22: 75 mL via INTRAVENOUS

## 2023-08-25 ENCOUNTER — Inpatient Hospital Stay: Payer: Medicare Other | Admitting: Internal Medicine

## 2023-08-25 VITALS — BP 116/68 | HR 83 | Temp 97.3°F | Resp 16 | Wt 116.5 lb

## 2023-08-25 DIAGNOSIS — C3411 Malignant neoplasm of upper lobe, right bronchus or lung: Secondary | ICD-10-CM | POA: Diagnosis not present

## 2023-08-25 DIAGNOSIS — C7931 Secondary malignant neoplasm of brain: Secondary | ICD-10-CM | POA: Diagnosis present

## 2023-08-25 DIAGNOSIS — C3490 Malignant neoplasm of unspecified part of unspecified bronchus or lung: Secondary | ICD-10-CM | POA: Diagnosis not present

## 2023-08-25 NOTE — Progress Notes (Signed)
[facility] Telephone:([phone]) [phone]   Fax:(336) [phone]  OFFICE PROGRESS NOTE  Porfirio Oar, PA [address] [place] [address]  DIAGNOSIS: Metastatic non-small cell lung cancer initially diagnosed as stage IIIA (T1b, N2, M0) non-small cell lung cancer, adenocarcinoma presented with right upper lobe lung nodule as well as right upper paratracheal adenopathy and suspicious tiny nodule in the right middle lobe.  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  First dose 05/30/2020. Status post 3 cycles. 2) status post robotic assisted right thoracoscopy with right upper lobectomy and lymph node dissection under the care of Dr. Dorris Fetch on October 19, 2020.  There was residual tumor measuring 0.5 cm with lymph node metastasis involving 4R, 12 and hilar lymph nodes. 3) Adjuvant systemic chemotherapy with carboplatin for AUC of 5 and Alimta 500 Mg/M2 every 3 weeks.  First dose was given December 11, 2020.  Status post 4 cycles.  Last dose of chemotherapy was given Feb 13, 2021. 4) status post right posterior parietal craniotomy with resection of the brain mass under the care of Dr. Marikay Alar followed by [facility] to the solitary brain metastasis under the care of Dr. Kathrynn Running.  The final pathology was consistent with metastatic adenocarcinoma of lung primary. 5) Adjuvant immunotherapy with Atezolizumab 1200 Mg IV every 3 weeks.  First dose March 27, 2021.  Status post 11 cycles.  Last cycle was given on November 12, 2021 this treatment was discontinued secondary to disease progression in the brain.    CURRENT THERAPY: Observation  INTERVAL HISTORY: [name] 72 y.o. female returns to the clinic today for 31-month follow-up visit.Discussed the use of AI scribe  software for clinical note transcription with the patient, who gave verbal consent to proceed.  History of Present Illness   The patient, a 72 year old with a history of stage 3 non-small cell lung cancer, was initially diagnosed in September 2021. She underwent neoadjuvant chemotherapy with three rounds of immunotherapy, followed by a right upper lobectomy. Post-surgery, she was started on adjuvant immunotherapy with atezolizumab, completing eleven cycles. However, in April 2023, brain metastasis was discovered and treated with Stereotactic Radiosurgery [facility]) by Dr. Kathrynn Running. Since then, the patient has been under observation.  In the past six months, the patient reports no new physical complaints, denying chest pain, shortness of breath, coughing blood, nausea, vomiting, diarrhea, or weight loss. However, she expresses emotional distress due to the loss of her spouse in January of the same year.  A recent CT scan revealed a new nodule measuring 1.0 cm in the medial aspect of the superior segment of the left lower lobe, raising suspicion of lung cancer recurrence.        MEDICAL HISTORY: 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    ALLERGIES:  has No Known Allergies.  MEDICATIONS:  Current Outpatient Medications  Medication Sig Dispense Refill   acetaminophen (TYLENOL) 325 MG tablet Take 1-2 tablets (325-650 mg total) by mouth every 4 (four) hours as needed for mild pain.     albuterol (PROVENTIL HFA;VENTOLIN HFA) 108 (90 Base) MCG/ACT inhaler Inhale 2 puffs into the lungs every 4 (four) hours as needed  for wheezing or shortness of breath (cough, shortness of breath or wheezing.). 1 Inhaler 1   alendronate (FOSAMAX) 70 MG tablet Take 70 mg by mouth once a week.     Cholecalciferol (VITAMIN D3 PO) Take 2,000 Units by mouth daily.     fluticasone (FLONASE) 50 MCG/ACT nasal spray Place 2 sprays  into both nostrils daily. 16 g 12   Fluticasone-Salmeterol (ADVAIR) 250-50 MCG/DOSE AEPB Inhale 1 puff into the lungs 2 (two) times daily.      levothyroxine (SYNTHROID) 75 MCG tablet TAKE 1 TABLET BY MOUTH EVERY DAY BEFORE BREAKFAST 30 tablet 2   Multiple Vitamin (MULTIVITAMIN) tablet Take 1 tablet by mouth daily.     No current facility-administered medications for this visit.    SURGICAL HISTORY:  Past Surgical History:  Procedure Laterality Date   APPENDECTOMY  2008   APPLICATION OF CRANIAL NAVIGATION Right 01/11/2022   Procedure: APPLICATION OF CRANIAL NAVIGATION;  Surgeon: Tia Alert, MD;  Location: [facility] OR;  Service: Neurosurgery;  Laterality: Right;   COLON SURGERY  2008   colostomy-infection post op appendectomy   COLOSTOMY REVERSAL  2009   CRANIOTOMY Right 01/11/2022   Procedure: RIGHT CRANIOTOMY FOR TUMOR RESECTION WITH BRAIN LAB;  Surgeon: Tia Alert, MD;  Location: [facility] OR;  Service: Neurosurgery;  Laterality: Right;   INTERCOSTAL NERVE BLOCK Right 10/19/2020   Procedure: INTERCOSTAL NERVE BLOCK;  Surgeon: Loreli Slot, MD;  Location: [facility] OR;  Service: Thoracic;  Laterality: Right;   NODE DISSECTION Right 10/19/2020   Procedure: NODE DISSECTION;  Surgeon: Loreli Slot, MD;  Location: [facility] OR;  Service: Thoracic;  Laterality: Right;   VIDEO BRONCHOSCOPY WITH ENDOBRONCHIAL NAVIGATION N/A 05/08/2020   Procedure: VIDEO BRONCHOSCOPY WITH ENDOBRONCHIAL NAVIGATION;  Surgeon: Loreli Slot, MD;  Location: [facility] OR;  Service: Thoracic;  Laterality: N/A;   VIDEO BRONCHOSCOPY WITH ENDOBRONCHIAL ULTRASOUND N/A 05/08/2020   Procedure: VIDEO BRONCHOSCOPY WITH ENDOBRONCHIAL ULTRASOUND;  Surgeon: Loreli Slot, MD;  Location: [facility] OR;  Service: Thoracic;  Laterality: N/A;    REVIEW OF SYSTEMS:  Constitutional: positive for fatigue Eyes: negative Ears, nose, mouth, throat, and face: negative Respiratory: negative Cardiovascular: negative Gastrointestinal:  negative Genitourinary:negative Integument/breast: negative Hematologic/lymphatic: negative Musculoskeletal:negative Neurological: negative Behavioral/Psych: positive for depression Endocrine: negative Allergic/Immunologic: negative   PHYSICAL EXAMINATION: General appearance: alert, cooperative, and no distress Head: Normocephalic, without obvious abnormality, atraumatic Neck: no adenopathy, no JVD, supple, symmetrical, trachea midline, and thyroid not enlarged, symmetric, no tenderness/mass/nodules Lymph nodes: Cervical, supraclavicular, and axillary nodes normal. Resp: clear to auscultation bilaterally Back: symmetric, no curvature. ROM normal. No CVA tenderness. Cardio: regular rate and rhythm, S1, S2 normal, no murmur, click, rub or gallop GI: soft, non-tender; bowel sounds normal; no masses,  no organomegaly Extremities: extremities normal, atraumatic, no cyanosis or edema Neurologic: Alert and oriented X 3, normal strength and tone. Normal symmetric reflexes. Normal coordination and gait  ECOG PERFORMANCE STATUS: 1 - Symptomatic but completely ambulatory  Blood pressure 116/68, pulse 83, temperature (!) 97.3 F (36.3 C), temperature source Temporal, resp. rate 16, weight 116 lb 8 oz (52.8 kg), SpO2 99%.  LABORATORY DATA: Lab Results  Component Value Date   WBC 7.4 08/22/2023   HGB 13.6 08/22/2023   HCT 42.1 08/22/2023   MCV 95.9 08/22/2023   PLT 275 08/22/2023      Chemistry      Component Value Date/Time   NA 140 08/22/2023 1124   NA 135 10/29/2017 1131   K 3.8 08/22/2023 1124  CL 104 08/22/2023 1124   CO2 29 08/22/2023 1124   BUN 5 (L) 08/22/2023 1124   BUN 5 (L) 10/29/2017 1131   CREATININE 0.55 08/22/2023 1124      Component Value Date/Time   CALCIUM 9.7 08/22/2023 1124   ALKPHOS 60 08/22/2023 1124   AST 20 08/22/2023 1124   ALT 13 08/22/2023 1124   BILITOT 0.3 08/22/2023 1124       RADIOGRAPHIC STUDIES: CT Chest W Contrast  Result Date:  08/23/2023 CLINICAL DATA:  Non-small cell lung cancer.  Restaging. EXAM: CT CHEST WITH CONTRAST TECHNIQUE: Multidetector CT imaging of the chest was performed during intravenous contrast administration. RADIATION DOSE REDUCTION: This exam was performed according to the departmental dose-optimization program which includes automated exposure control, adjustment of the mA and/or kV according to patient size and/or use of iterative reconstruction technique. CONTRAST:  75mL OMNIPAQUE IOHEXOL 300 MG/ML  SOLN COMPARISON:  02/24/2023 FINDINGS: Cardiovascular: Heart size is normal. Aortic atherosclerosis. Coronary artery calcifications. No pericardial effusion. Mediastinum/Nodes: No enlarged mediastinal, hilar, or axillary lymph nodes. Thyroid gland, trachea, and esophagus demonstrate no significant findings. Lungs/Pleura: Centrilobular and paraseptal emphysema. No pleural fluid, airspace consolidation or pneumothorax. Status post right upper lobectomy. Subpleural scarring noted within the lateral right base. Within the medial aspect of the superior segment left lower lobe there is a spiculated lung nodule measuring 1 cm, image 54/8. This is a new finding when compared with the previous exam. Upper Abdomen: No acute abnormality. Gallstones. The adrenal glands are preserved. Musculoskeletal: There is a fracture deformity involving the posterior aspect of the right eleventh rib which is unchanged from prior exam. No new or suspicious osseous findings. IMPRESSION: 1. Status post right upper lobectomy. 2. There is a new 1 cm spiculated lung nodule within the medial aspect of the superior segment left lower lobe. This is a new finding when compared with the previous exam. This is concerning for primary bronchogenic carcinoma versus metastatic disease. 3. Coronary artery calcifications. 4. Cholelithiasis. 5. Aortic Atherosclerosis (ICD10-I70.0) and Emphysema (ICD10-J43.9). Electronically Signed   By: Signa Kell M.D.   On:  08/23/2023 10:32   MR Brain W Wo Contrast  Addendum Date: 08/11/2023   ADDENDUM REPORT: 08/11/2023 10:29 ADDENDUM: On further review there is a new punctate focus of contrast enhancement along the inferior aspect of the resection site (series 14, image 80). This could be vascular in nature, but recurrent disease is not excluded. This finding was discussed at multidisciplinary tumor board conference on 08/11/23. Electronically Signed   By: Lorenza Cambridge M.D.   On: 08/11/2023 10:29   Result Date: 08/11/2023 CLINICAL DATA:  Brain metastases, assess treatment response 3T SRS Protocol EXAM: MRI HEAD WITHOUT AND WITH CONTRAST TECHNIQUE: Multiplanar, multiecho pulse sequences of the brain and surrounding structures were obtained without and with intravenous contrast. CONTRAST:  2.5 ml Vueway COMPARISON:  Brain MR 05/06/23 FINDINGS: Brain: Negative for an acute infarct. No hemorrhage. No hydrocephalus. No extra-axial fluid collection. No mass effect. There is background of mild chronic microvascular ischemic change. Postsurgical changes from right parietal craniotomy with unchanged minimal contrast enhancement adjacent to the craniotomy site. And along the ependymal margin of the right occipital horn (series 14, image 24). No new contrast-enhancing lesions are visualized. T2/stir hyperintense signal abnormality in the region is unchanged from prior exam. Vascular: Normal flow voids. Skull and upper cervical spine: Normal marrow signal. Sinuses/Orbits: No middle ear or mastoid effusion. Paranasal sinuses are clear. Bilateral lens replacement. Orbits are otherwise unremarkable. Other: None.  IMPRESSION: Unchanged minimal contrast enhancement adjacent to the right parietal craniotomy site and along the ependymal margin of the right occipital horn. No new contrast-enhancing lesions are visualized. Electronically Signed: By: Lorenza Cambridge M.D. On: 08/06/2023 15:09    ASSESSMENT AND PLAN: This is a very pleasant 72 years  old white female with metastatic non-small cell lung cancer, adenocarcinoma that was initially diagnosed as stage IIIa (T1b, N2, M0) non-small cell lung cancer, adenocarcinoma presented with right upper lobe lung nodule in addition to mediastinal lymphadenopathy.  PD-L1 expression 90%.  The patient has evidence for metastatic disease with solitary brain metastasis in April 2023 The patient underwent a course of neoadjuvant treatment with carboplatin for AUC of 5, Alimta 500 mg/M2 and Keytruda 200 mg IV every 3 weeks for 3 cycles.   She tolerated this treatment well with no concerning adverse effect except for dry skin and itching. Her repeat CT scan after the neoadjuvant treatment showed partial response with around 50% reduction in the tumor volume. The patient underwent right upper lobectomy with lymph node dissection on October 19, 2020 and the final pathology showed residual tumor measuring 0.5 cm with lymph node metastasis involving hilar and mediastinal lymph nodes. She underwent adjuvant systemic chemotherapy with carboplatin for AUC of 5, Alimta 500 mg/M2 every 3 weeks since she has good response to this treatment in the past.  She is status post 4 cycles.  She tolerated this treatment well with no concerning adverse effect except for mild fatigue. She underwent adjuvant immunotherapy with Tecentriq 1200 Mg IV every 3 weeks status post 12 cycles.  Last dose was given on November 12, 2021 discontinued after the patient develop metastatic brain lesions and she has been off treatment since her diagnosis with the brain metastasis.. The patient underwent craniotomy with resection of the solitary brain metastasis followed by SRS to the surgical cavity. The patient has been on observation since that time. She had repeat CT scan of the chest performed recently.  I personally and independently reviewed the scan and discussed the result with the patient today.  Her scan showed no new finding for disease  progression except for 1.0 cm spiculated lung nodule within the medial aspect of the superior segment of the left lower lobe suspicious for disease recurrence.    Non-Small Cell Lung Cancer with Brain Metastasis Initially diagnosed with stage III NSCLC in September 2021. Underwent neoadjuvant chemotherapy and immunotherapy followed by a right upper lobectomy. Post-surgery, received adjuvant immunotherapy with atezolizumab for 11 cycles. In April 2023, brain metastasis was treated with stereotactic radiosurgery [facility]). Recent CT scan shows a new 1.0 cm nodule in the medial aspect of the superior segment of the left lower lobe, suspicious for recurrence. Asymptomatic but experiencing emotional distress due to the loss of her spouse. Discussed high recurrence rate and need for regular scans. If PET scan confirms malignancy, local radiation therapy will be considered. Surgery is unlikely due to prior lobectomy. - Order PET scan to evaluate the new nodule and check for other potential metastases - Schedule follow-up appointment in 2-3 weeks, preferably after Christmas - Consider referral to Dr. Kathrynn Running for local radiation therapy if the nodule is confirmed malignant  Emotional Distress Significant emotional distress following the loss of her spouse in January 2024, impacting mental well-being. - Provide emotional support and consider referral to a mental health professional if needed  Follow-up - Schedule follow-up appointment in 2-3 weeks to discuss PET scan results and further management.   The patient was  advised to call immediately if she has any other concerning symptoms in the interval. The patient voices understanding of current disease status and treatment options and is in agreement with the current care plan.  All questions were answered. The patient knows to call the clinic with any problems, questions or concerns. We can certainly see the patient much sooner if necessary. The total time spent  in the appointment was 30 minutes.   Disclaimer: This note was dictated with voice recognition software. Similar sounding words can inadvertently be transcribed and may not be corrected upon review.

## 2023-08-27 ENCOUNTER — Other Ambulatory Visit: Payer: Self-pay

## 2023-08-27 ENCOUNTER — Telehealth: Payer: Self-pay | Admitting: Internal Medicine

## 2023-08-27 NOTE — Telephone Encounter (Signed)
Left patient a message in regards to scheduled appointment times/dates; left callback for rescheduling if needed

## 2023-09-11 ENCOUNTER — Encounter (HOSPITAL_COMMUNITY)
Admission: RE | Admit: 2023-09-11 | Discharge: 2023-09-11 | Disposition: A | Discharge status: Home / Self Care | Payer: Medicare Other | Source: Ambulatory Visit | Attending: Internal Medicine | Admitting: Internal Medicine

## 2023-09-11 DIAGNOSIS — C3490 Malignant neoplasm of unspecified part of unspecified bronchus or lung: Secondary | ICD-10-CM | POA: Diagnosis present

## 2023-09-11 DIAGNOSIS — K8020 Calculus of gallbladder without cholecystitis without obstruction: Secondary | ICD-10-CM | POA: Insufficient documentation

## 2023-09-11 DIAGNOSIS — K5730 Diverticulosis of large intestine without perforation or abscess without bleeding: Secondary | ICD-10-CM | POA: Diagnosis not present

## 2023-09-11 DIAGNOSIS — J432 Centrilobular emphysema: Secondary | ICD-10-CM | POA: Diagnosis not present

## 2023-09-11 DIAGNOSIS — I700 Atherosclerosis of aorta: Secondary | ICD-10-CM | POA: Insufficient documentation

## 2023-09-11 LAB — GLUCOSE, CAPILLARY: Glucose-Capillary: 63 mg/dL — ABNORMAL LOW (ref 70–99)

## 2023-09-11 MED ORDER — FLUDEOXYGLUCOSE F - 18 (FDG) INJECTION
6.0000 | Freq: Once | INTRAVENOUS | Status: AC | PRN
  Administered 2023-09-11: 5.76 via INTRAVENOUS

## 2023-09-15 ENCOUNTER — Inpatient Hospital Stay: Payer: Medicare Other | Admitting: Internal Medicine

## 2023-09-15 VITALS — BP 126/68 | HR 80 | Temp 98.0°F | Resp 15 | Ht 62.5 in | Wt 118.7 lb

## 2023-09-15 DIAGNOSIS — C3490 Malignant neoplasm of unspecified part of unspecified bronchus or lung: Secondary | ICD-10-CM

## 2023-09-15 DIAGNOSIS — C7931 Secondary malignant neoplasm of brain: Secondary | ICD-10-CM | POA: Diagnosis not present

## 2023-09-15 NOTE — Progress Notes (Signed)
[facility] [phone]   [phone]  OFFICE PROGRESS NOTE  Porfirio Oar, [place] [address] [place] [address]  DIAGNOSIS: Metastatic non-small cell lung cancer initially diagnosed as stage IIIA (T1b, N2, M0) non-small cell lung cancer, adenocarcinoma presented with right upper lobe lung nodule as well as right upper paratracheal adenopathy and suspicious tiny nodule in the right middle lobe.  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  First dose 05/30/2020. Status post 3 cycles. 2) status post robotic assisted right thoracoscopy with right upper lobectomy and lymph node dissection under the care of Dr. Dorris Fetch on October 19, 2020.  There was residual tumor measuring 0.5 cm with lymph node metastasis involving 4R, 12 and hilar lymph nodes. 3) Adjuvant systemic chemotherapy with carboplatin for AUC of 5 and Alimta 500 Mg/M2 every 3 weeks.  First dose was given December 11, 2020.  Status post 4 cycles.  Last dose of chemotherapy was given Feb 13, 2021. 4) status post right posterior parietal craniotomy with resection of the brain mass under the care of Dr. Marikay Alar followed by [facility] to the solitary brain metastasis under the care of Dr. Kathrynn Running.  The final pathology was consistent with metastatic adenocarcinoma of lung primary. 5) Adjuvant immunotherapy with Atezolizumab 1200 Mg IV every 3 weeks.  First dose March 27, 2021.  Status post 11 cycles.  Last cycle was given on November 12, 2021 this treatment was discontinued secondary to disease progression in the brain.    CURRENT THERAPY: Observation  INTERVAL HISTORY: [name] 72 y.o. female returns to the clinic today for 36-month follow-up visit accompanied by her  sister-in-law.Discussed the use of AI scribe software for clinical note transcription with the patient, who gave verbal consent to proceed.  History of Present Illness   The patient, a 72 year old with a history of stage 3A non-small cell lung cancer (adenocarcinoma), was initially diagnosed in September 2021. She underwent neoadjuvant chemotherapy and immunotherapy with carboplatin, Alimta, and Keytruda, followed by a right upper lobectomy and lymph node dissection. Post-surgery, she received adjuvant chemotherapy with carboplatin and Alimta, and adjuvant immunotherapy with atezolizumab.  In April 2023, the patient developed brain metastasis, which was treated with stereotactic radiosurgery [facility]). Since then, she has been under observation. A scan in December revealed a new nodule in the left lung, which was not present before.  The patient denies any current complaints, including chest pain, breathing issues, nausea, vomiting, diarrhea, headaches, or changes in vision. She reports feeling "okay" at the time of the consultation.  The patient also has a history of radiation treatment for a small lesion in the brain, which is being monitored closely with regular MRIs. The most recent MRI revealed a small finding, which could be scar tissue or necrosis from previous radiation. The patient is scheduled for another MRI in late January.       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  MEDICATIONS:  Current Outpatient Medications  Medication Sig Dispense Refill   acetaminophen (TYLENOL) 325 MG tablet Take 1-2 tablets (325-650 mg total) by mouth  every 4 (four) hours as needed for mild pain.     albuterol (PROVENTIL HFA;VENTOLIN HFA) 108 (90 Base) MCG/ACT inhaler Inhale 2 puffs into the lungs every 4 (four) hours as needed for wheezing or  shortness of breath (cough, shortness of breath or wheezing.). 1 Inhaler 1   alendronate (FOSAMAX) 70 MG tablet Take 70 mg by mouth once a week.     Cholecalciferol (VITAMIN D3 PO) Take 2,000 Units by mouth daily.     fluticasone (FLONASE) 50 MCG/ACT nasal spray Place 2 sprays into both nostrils daily. 16 g 12   Fluticasone-Salmeterol (ADVAIR) 250-50 MCG/DOSE AEPB Inhale 1 puff into the lungs 2 (two) times daily.      levothyroxine (SYNTHROID) 75 MCG tablet TAKE 1 TABLET BY MOUTH EVERY DAY BEFORE BREAKFAST 30 tablet 2   Multiple Vitamin (MULTIVITAMIN) tablet Take 1 tablet by mouth daily.     No current facility-administered medications for this visit.    SURGICAL HISTORY:  Past Surgical History:  Procedure Laterality Date   APPENDECTOMY  2008   APPLICATION OF CRANIAL NAVIGATION Right 01/11/2022   Procedure: APPLICATION OF CRANIAL NAVIGATION;  Surgeon: Tia Alert, MD;  Location: [facility] OR;  Service: Neurosurgery;  Laterality: Right;   COLON SURGERY  2008   colostomy-infection post op appendectomy   COLOSTOMY REVERSAL  2009   CRANIOTOMY Right 01/11/2022   Procedure: RIGHT CRANIOTOMY FOR TUMOR RESECTION WITH BRAIN LAB;  Surgeon: Tia Alert, MD;  Location: [facility] OR;  Service: Neurosurgery;  Laterality: Right;   INTERCOSTAL NERVE BLOCK Right 10/19/2020   Procedure: INTERCOSTAL NERVE BLOCK;  Surgeon: Loreli Slot, MD;  Location: [facility] OR;  Service: Thoracic;  Laterality: Right;   NODE DISSECTION Right 10/19/2020   Procedure: NODE DISSECTION;  Surgeon: Loreli Slot, MD;  Location: [facility] OR;  Service: Thoracic;  Laterality: Right;   VIDEO BRONCHOSCOPY WITH ENDOBRONCHIAL NAVIGATION N/A 05/08/2020   Procedure: VIDEO BRONCHOSCOPY WITH ENDOBRONCHIAL NAVIGATION;  Surgeon: Loreli Slot, MD;  Location: [facility] OR;  Service: Thoracic;  Laterality: N/A;   VIDEO BRONCHOSCOPY WITH ENDOBRONCHIAL ULTRASOUND N/A 05/08/2020   Procedure: VIDEO BRONCHOSCOPY WITH ENDOBRONCHIAL ULTRASOUND;  Surgeon:  Loreli Slot, MD;  Location: [facility] OR;  Service: Thoracic;  Laterality: N/A;    REVIEW OF SYSTEMS:  Constitutional: positive for fatigue Eyes: negative Ears, nose, mouth, throat, and face: negative Respiratory: negative Cardiovascular: negative Gastrointestinal: negative Genitourinary:negative Integument/breast: negative Hematologic/lymphatic: negative Musculoskeletal:negative Neurological: negative Behavioral/Psych: positive for anxiety Endocrine: negative Allergic/Immunologic: negative   PHYSICAL EXAMINATION: General appearance: alert, cooperative, fatigued, and no distress Head: Normocephalic, without obvious abnormality, atraumatic Neck: no adenopathy, no JVD, supple, symmetrical, trachea midline, and thyroid not enlarged, symmetric, no tenderness/mass/nodules Lymph nodes: Cervical, supraclavicular, and axillary nodes normal. Resp: clear to auscultation bilaterally Back: symmetric, no curvature. ROM normal. No CVA tenderness. Cardio: regular rate and rhythm, S1, S2 normal, no murmur, click, rub or gallop GI: soft, non-tender; bowel sounds normal; no masses,  no organomegaly Extremities: extremities normal, atraumatic, no cyanosis or edema Neurologic: Alert and oriented X 3, normal strength and tone. Normal symmetric reflexes. Normal coordination and gait  ECOG PERFORMANCE STATUS: 1 - Symptomatic but completely ambulatory  Blood pressure 126/68, pulse 80, temperature 98 F (36.7 C), temperature source Temporal, resp. rate 15, height 5' 2.5" (1.588 m), weight 118 lb 11.2 oz (53.8 kg), SpO2 95%.  LABORATORY DATA: Lab Results  Component Value Date   WBC 7.4 08/22/2023   HGB 13.6 08/22/2023   HCT 42.1 08/22/2023   MCV 95.9  08/22/2023   PLT 275 08/22/2023      Chemistry      Component Value Date/Time   NA 140 08/22/2023 1124   NA 135 10/29/2017 1131   K 3.8 08/22/2023 1124   CL 104 08/22/2023 1124   CO2 29 08/22/2023 1124   BUN 5 (L) 08/22/2023 1124   BUN 5  (L) 10/29/2017 1131   CREATININE 0.55 08/22/2023 1124      Component Value Date/Time   CALCIUM 9.7 08/22/2023 1124   ALKPHOS 60 08/22/2023 1124   AST 20 08/22/2023 1124   ALT 13 08/22/2023 1124   BILITOT 0.3 08/22/2023 1124       RADIOGRAPHIC STUDIES: NM PET Image Restage (PS) Skull Base to Thigh (F-18 FDG) Result Date: 09/12/2023 CLINICAL DATA:  Subsequent treatment strategy for non-small cell lung cancer. EXAM: NUCLEAR MEDICINE PET SKULL BASE TO THIGH TECHNIQUE: 5.8 mCi F-18 FDG was injected intravenously. Full-ring PET imaging was performed from the skull base to thigh after the radiotracer. CT data was obtained and used for attenuation correction and anatomic localization. Fasting blood glucose: 66 mg/dl COMPARISON:  23/55/7322 and CT exam of 08/22/2023 FINDINGS: Mediastinal blood pool activity: SUV max 2.7 Liver activity: SUV max NA NECK: No significant abnormal hypermetabolic activity in this region. Incidental CT findings: Bilateral common carotid atheromatous vascular calcifications. CHEST: 0.8 cm superior segment left lower lobe nodule on image 29 series 7 has maximum SUV of 7.7. Incidental CT findings: Right upper lobectomy. Centrilobular emphysema. Coronary, aortic arch, and branch vessel atherosclerotic vascular disease. ABDOMEN/PELVIS: No significant abnormal hypermetabolic activity in this region. Incidental CT findings: Cholelithiasis. Atherosclerosis is present, including aortoiliac atherosclerotic disease. Small supraumbilical hernia contains adipose tissue. Mild sigmoid colon diverticulosis. SKELETON: No significant abnormal hypermetabolic activity in this region. Incidental CT findings: None. IMPRESSION: 1. The 0.8 cm superior segment left lower lobe nodule has maximum SUV of 7.7, suspicious for malignancy. 2. Right upper lobectomy. 3. Centrilobular emphysema. 4. Cholelithiasis. 5. Small supraumbilical hernia contains adipose tissue. 6. Mild sigmoid colon diverticulosis. 7. Aortic,  coronary, and systemic atherosclerosis. Aortic Atherosclerosis (ICD10-I70.0) and Emphysema (ICD10-J43.9). Electronically Signed   By: Gaylyn Rong M.D.   On: 09/12/2023 09:21   CT Chest W Contrast Result Date: 08/23/2023 CLINICAL DATA:  Non-small cell lung cancer.  Restaging. EXAM: CT CHEST WITH CONTRAST TECHNIQUE: Multidetector CT imaging of the chest was performed during intravenous contrast administration. RADIATION DOSE REDUCTION: This exam was performed according to the departmental dose-optimization program which includes automated exposure control, adjustment of the mA and/or kV according to patient size and/or use of iterative reconstruction technique. CONTRAST:  75mL OMNIPAQUE IOHEXOL 300 MG/ML  SOLN COMPARISON:  02/24/2023 FINDINGS: Cardiovascular: Heart size is normal. Aortic atherosclerosis. Coronary artery calcifications. No pericardial effusion. Mediastinum/Nodes: No enlarged mediastinal, hilar, or axillary lymph nodes. Thyroid gland, trachea, and esophagus demonstrate no significant findings. Lungs/Pleura: Centrilobular and paraseptal emphysema. No pleural fluid, airspace consolidation or pneumothorax. Status post right upper lobectomy. Subpleural scarring noted within the lateral right base. Within the medial aspect of the superior segment left lower lobe there is a spiculated lung nodule measuring 1 cm, image 54/8. This is a new finding when compared with the previous exam. Upper Abdomen: No acute abnormality. Gallstones. The adrenal glands are preserved. Musculoskeletal: There is a fracture deformity involving the posterior aspect of the right eleventh rib which is unchanged from prior exam. No new or suspicious osseous findings. IMPRESSION: 1. Status post right upper lobectomy. 2. There is a new 1 cm spiculated lung  nodule within the medial aspect of the superior segment left lower lobe. This is a new finding when compared with the previous exam. This is concerning for primary  bronchogenic carcinoma versus metastatic disease. 3. Coronary artery calcifications. 4. Cholelithiasis. 5. Aortic Atherosclerosis (ICD10-I70.0) and Emphysema (ICD10-J43.9). Electronically Signed   By: Signa Kell M.D.   On: 08/23/2023 10:32    ASSESSMENT AND PLAN: This is a very pleasant 72 years old white female with metastatic non-small cell lung cancer, adenocarcinoma that was initially diagnosed as stage IIIa (T1b, N2, M0) non-small cell lung cancer, adenocarcinoma presented with right upper lobe lung nodule in addition to mediastinal lymphadenopathy.  PD-L1 expression 90%.  The patient has evidence for metastatic disease with solitary brain metastasis in April 2023 The patient underwent a course of neoadjuvant treatment with carboplatin for AUC of 5, Alimta 500 mg/M2 and Keytruda 200 mg IV every 3 weeks for 3 cycles.   She tolerated this treatment well with no concerning adverse effect except for dry skin and itching. Her repeat CT scan after the neoadjuvant treatment showed partial response with around 50% reduction in the tumor volume. The patient underwent right upper lobectomy with lymph node dissection on October 19, 2020 and the final pathology showed residual tumor measuring 0.5 cm with lymph node metastasis involving hilar and mediastinal lymph nodes. She underwent adjuvant systemic chemotherapy with carboplatin for AUC of 5, Alimta 500 mg/M2 every 3 weeks since she has good response to this treatment in the past.  She is status post 4 cycles.  She tolerated this treatment well with no concerning adverse effect except for mild fatigue. She underwent adjuvant immunotherapy with Tecentriq 1200 Mg IV every 3 weeks status post 12 cycles.  Last dose was given on November 12, 2021 discontinued after the patient develop metastatic brain lesions and she has been off treatment since her diagnosis with the brain metastasis.. The patient underwent craniotomy with resection of the solitary brain  metastasis followed by SRS to the surgical cavity. The patient has been on observation since that time. Her follow-up CT scan showed no new finding for disease progression except for 1.0 cm spiculated lung nodule within the medial aspect of the superior segment of the left lower lobe suspicious for disease recurrence. She had a PET scan performed recently that showed hypermetabolic in the superior segment left lower lobe pulmonary nodule suspicious for new malignancy. I personally and independently reviewed the scan images and showed the images and discussed the result with the patient today.    Recurrent Non-Small Cell Lung Cancer (NSCLC) Stage IIIA adenocarcinoma diagnosed in September 2021. Treated with neoadjuvant chemotherapy (carboplatin, Alimta, Keytruda), right upper lobectomy, lymph node dissection, adjuvant chemotherapy (carboplatin, Alimta), and adjuvant immunotherapy (atezolizumab). Brain metastasis in April 2023 treated with SRS. Recent PET scan shows active nodule in the superior segment of the left lower lobe, indicating recurrence. No new lesions. Discussed and decided on SBRT for treatment. - Refer to Dr. Kathrynn Running for SBRT to treat the left lung nodule - Schedule follow-up in four months with repeat chest, abdomen, and pelvis scan  Brain Metastasis Treated with SRS in April 2023. Under regular surveillance with brain MRIs. Recent MRI shows a small finding, possibly scar tissue or necrosis from prior radiation. Next MRI scheduled for October 08, 2023. - Continue regular brain MRI surveillance - Next brain MRI scheduled for October 08, 2023  Follow-up - Follow-up appointment in four months - Repeat chest, abdomen, and pelvis scan at follow-up.   The  patient was advised to call immediately if she has any other concerning symptoms in the interval. The patient voices understanding of current disease status and treatment options and is in agreement with the current care plan.  All  questions were answered. The patient knows to call the clinic with any problems, questions or concerns. We can certainly see the patient much sooner if necessary. The total time spent in the appointment was 30 minutes.   Disclaimer: This note was dictated with voice recognition software. Similar sounding words can inadvertently be transcribed and may not be corrected upon review.

## 2023-10-02 ENCOUNTER — Ambulatory Visit: Payer: Medicare Other

## 2023-10-02 ENCOUNTER — Ambulatory Visit: Payer: Medicare Other | Admitting: Urology

## 2023-10-03 ENCOUNTER — Other Ambulatory Visit: Payer: Self-pay | Admitting: Internal Medicine

## 2023-10-08 ENCOUNTER — Ambulatory Visit
Admission: RE | Admit: 2023-10-08 | Discharge: 2023-10-08 | Disposition: A | Discharge status: Home / Self Care | Payer: Medicare Other | Source: Ambulatory Visit | Attending: Radiation Oncology | Admitting: Radiation Oncology

## 2023-10-08 DIAGNOSIS — C7931 Secondary malignant neoplasm of brain: Secondary | ICD-10-CM

## 2023-10-08 MED ORDER — GADOPICLENOL 0.5 MMOL/ML IV SOLN
5.0000 mL | Freq: Once | INTRAVENOUS | Status: AC | PRN
  Administered 2023-10-08: 5 mL via INTRAVENOUS

## 2023-10-09 ENCOUNTER — Ambulatory Visit
Admission: RE | Admit: 2023-10-09 | Discharge: 2023-10-09 | Disposition: A | Discharge status: Home / Self Care | Payer: Medicare Other | Source: Ambulatory Visit | Attending: Urology | Admitting: Urology

## 2023-10-09 ENCOUNTER — Other Ambulatory Visit: Payer: Self-pay | Admitting: Radiation Therapy

## 2023-10-09 ENCOUNTER — Encounter: Payer: Self-pay | Admitting: Urology

## 2023-10-09 VITALS — BP 129/65 | HR 86 | Temp 97.6°F | Resp 18 | Ht 62.5 in | Wt 122.4 lb

## 2023-10-09 DIAGNOSIS — C7931 Secondary malignant neoplasm of brain: Secondary | ICD-10-CM | POA: Diagnosis not present

## 2023-10-09 DIAGNOSIS — C3490 Malignant neoplasm of unspecified part of unspecified bronchus or lung: Secondary | ICD-10-CM

## 2023-10-09 DIAGNOSIS — C3492 Malignant neoplasm of unspecified part of left bronchus or lung: Secondary | ICD-10-CM | POA: Insufficient documentation

## 2023-10-09 DIAGNOSIS — C3411 Malignant neoplasm of upper lobe, right bronchus or lung: Secondary | ICD-10-CM

## 2023-10-09 NOTE — Progress Notes (Signed)
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 Malignant neoplasm of upper lobe of right lung (HCC)  C34.11     3. Non-small cell carcinoma of left lung, stage 1 (HCC)  C34.92       HISTORY OF PRESENT ILLNESS: EMALY RIGHETTI is a 73 y.o. female seen at the request of Dr. Arbutus Ped.  She is well-known to our service,s/p preoperative stereotactic radiosurgery (SRS) in 12/2021, to a solitary 3.5 cm right occipital metastasis, secondary to her known NSCLC, adenocarcinoma of the RUL lung.   In summary, she was initially diagnosed as stage IIIa ([id number]) NSCLC, adenocarcinoma presenting with a right upper lobe lung nodule as well as right upper paratracheal adenopathy and suspicious tiny nodule in the right middle lobe lung.  She was treated with neoadjuvant chemotherapy followed by a right upper lobectomy in February 2022 with Dr. Dorris Fetch and subsequently treated with adjuvant chemotherapy which was completed in June 2022.  She continued in routine follow-up under the care and direction of Dr. Arbutus Ped and developed a solitary brain metastasis in April 2023 that was treated with preoperative SRS followed by surgical resection with Dr. Yetta Barre.  We have continued to monitor the brain disease with serial MRI brain scans every 3 months, with her most recent scan on 10/08/2023 showing no definite disease recurrence.  There was an unchanged appearance of a punctate focus at the right occipital  treatment site, seen initially on MRI brain from 08/06/2023, but no new or progressive disease.  We reviewed those results today and the plan to continue to monitor with serial MRI brain scans every 3 months.  She had a recent surveillance CT chest on 08/22/2023 that showed a new 1 cm spiculated nodule within the medial aspect of the superior segment of the left lower lobe lung.  A PET scan was performed on 09/11/2023 for further evaluation and confirmed this new nodule to be hypermetabolic, suspicious for new malignancy.  Fortunately, there was no associated lymphadenopathy or distant disease.  She has been kindly referred back to [place] today to discuss the potential role for radiotherapy in the management of this new lung nodule in the LLL.  PREVIOUS RADIATION THERAPY: Yes  01/03/22, 01/07/22, 01/09/22: Fractionated Pre-OP SRS PTV1: The 3.5 cm right occipital target was treated to 24 Gy in 3 fractions of 8 Gy each.   PAST MEDICAL HISTORY:  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      PAST SURGICAL HISTORY: Past Surgical History:  Procedure Laterality Date   APPENDECTOMY  2008   APPLICATION OF CRANIAL NAVIGATION Right 01/11/2022   Procedure: APPLICATION OF CRANIAL NAVIGATION;  Surgeon: Tia Alert, MD;  Location: [facility] OR;  Service: Neurosurgery;  Laterality: Right;   COLON SURGERY  2008   colostomy-infection post op appendectomy   COLOSTOMY REVERSAL  2009   CRANIOTOMY Right 01/11/2022   Procedure: RIGHT CRANIOTOMY FOR TUMOR RESECTION WITH BRAIN LAB;  Surgeon: Tia Alert, MD;  Location: [facility] OR;  Service: Neurosurgery;  Laterality: Right;   INTERCOSTAL NERVE BLOCK Right 10/19/2020   Procedure: INTERCOSTAL NERVE BLOCK;  Surgeon: Loreli Slot, MD;  Location: [facility] OR;  Service: Thoracic;  Laterality: Right;   NODE DISSECTION Right 10/19/2020   Procedure: NODE DISSECTION;  Surgeon: Loreli Slot, MD;  Location[facility] OR;  Service: Thoracic;  Laterality: Right;   VIDEO BRONCHOSCOPY WITH ENDOBRONCHIAL NAVIGATION N/A 05/08/2020   Procedure: VIDEO BRONCHOSCOPY WITH ENDOBRONCHIAL NAVIGATION;  Surgeon: Loreli Slot, MD;  Location: [facility] OR;  Service: Thoracic;  Laterality: N/A;   VIDEO BRONCHOSCOPY WITH ENDOBRONCHIAL ULTRASOUND N/A 05/08/2020   Procedure: VIDEO BRONCHOSCOPY WITH ENDOBRONCHIAL ULTRASOUND;  Surgeon: Loreli Slot, MD;  Location: [facility] OR;  Service: Thoracic;  Laterality: N/A;    FAMILY HISTORY:  Family History  Problem Relation Age of Onset   Mental illness Mother    Colon cancer Mother 31   Cancer Father    Colon polyps Neg Hx    Esophageal cancer Neg Hx    Stomach cancer Neg Hx    Rectal cancer Neg Hx     SOCIAL HISTORY:  Social History   Socioeconomic History   Marital status: Widowed    Spouse name: Not on file   Number of children: Not on file   Years of education: Not on file   Highest education level: Not on file  Occupational History   Not on file  Tobacco Use   Smoking status: Former    Current packs/day: 0.00    Average packs/day: 0.1 packs/day for 50.0 years (2.5 ttl pk-yrs)    Types: Cigarettes    Start date: 09/16/1969    Quit date: 09/17/2019    Years since quitting: 4.0   Smokeless tobacco: Never  Vaping Use   Vaping status: Never Used  Substance and Sexual Activity   Alcohol use: Not Currently    Alcohol/week: 10.0 standard drinks of alcohol    Types: 10 Cans of beer per week   Drug use: No   Sexual activity: Not on file  Other Topics Concern   Not on file  Social History Narrative   Not on file   Social Drivers of Health   Financial Resource Strain: Low Risk  (06/13/2023)   Received from [facility]   Overall Financial Resource Strain (CARDIA)    Difficulty of Paying Living Expenses: Not hard at all  Food Insecurity: No Food Insecurity (10/09/2023)   Hunger Vital Sign    Worried About Running Out of Food in the  Last Year: Never true    Ran Out of Food in the Last Year: Never true  Transportation Needs: No Transportation Needs (10/09/2023)   PRAPARE - Administrator, Civil Service (Medical): No    Lack of Transportation (Non-Medical): No  Physical Activity: Insufficiently Active (06/13/2023)   Received from [facility]   Exercise Vital Sign    Days of Exercise per Week: 3 days    Minutes of Exercise per Session: 30 min  Stress: Stress Concern Present (06/13/2023)   Received from [facility] - Occupational Stress Questionnaire    Feeling of Stress : To some extent  Social Connections: Moderately Integrated (06/13/2023)   Received from [facility]   Social Network    How would you rate your  social network (family, work, friends)?: Adequate participation with social networks  Intimate Partner Violence: Not At Risk (10/09/2023)   Humiliation, Afraid, Rape, and Kick questionnaire    Fear of Current or Ex-Partner: No    Emotionally Abused: No    Physically Abused: No    Sexually Abused: No    ALLERGIES: Patient has no known allergies.  MEDICATIONS:  Current Outpatient Medications  Medication Sig Dispense Refill   acetaminophen (TYLENOL) 325 MG tablet Take 1-2 tablets (325-650 mg total) by mouth every 4 (four) hours as needed for mild pain.     albuterol (PROVENTIL HFA;VENTOLIN HFA) 108 (90 Base) MCG/ACT inhaler Inhale 2 puffs into the lungs every 4 (four) hours as needed for wheezing or shortness of breath (cough, shortness of breath or wheezing.). 1 Inhaler 1   alendronate (FOSAMAX) 70 MG tablet Take 70 mg by mouth once a week.     Cholecalciferol (VITAMIN D3 PO) Take 2,000 Units by mouth daily.     fluticasone (FLONASE) 50 MCG/ACT nasal spray Place 2 sprays into both nostrils daily. 16 g 12   Fluticasone-Salmeterol (ADVAIR) 250-50 MCG/DOSE AEPB Inhale 1 puff into the lungs 2 (two) times daily.      levothyroxine (SYNTHROID) 75 MCG  tablet TAKE 1 TABLET BY MOUTH EVERY DAY BEFORE BREAKFAST 30 tablet 2   Multiple Vitamin (MULTIVITAMIN) tablet Take 1 tablet by mouth daily.     No current facility-administered medications for this encounter.    REVIEW OF SYSTEMS:  On review of systems, the patient reports that she is doing well overall.  She denies any chest pain, shortness of breath, cough, fevers, chills, night sweats, or unintended weight changes.  She denies any bowel or bladder disturbances, and denies abdominal pain, nausea or vomiting.  She denies any headaches, changes in visual acuity, difficulty with speech, dizziness/imbalance or seizure activity.  She denies any new musculoskeletal or joint aches or pains. A complete review of systems is obtained and is otherwise negative.    PHYSICAL EXAM:  Wt Readings from Last 3 Encounters:  10/09/23 122 lb 6.4 oz (55.5 kg)  09/15/23 118 lb 11.2 oz (53.8 kg)  08/25/23 116 lb 8 oz (52.8 kg)   Temp Readings from Last 3 Encounters:  10/09/23 97.6 F (36.4 C) (Oral)  09/15/23 98 F (36.7 C) (Temporal)  08/25/23 (!) 97.3 F (36.3 C) (Temporal)   BP Readings from Last 3 Encounters:  10/09/23 129/65  09/15/23 126/68  08/25/23 116/68   Pulse Readings from Last 3 Encounters:  10/09/23 86  09/15/23 80  08/25/23 83   Pain Assessment Pain Score: 0-No pain/10  In general this is a well appearing Caucasian woman in no acute distress.  She's alert and oriented x4 and appropriate throughout the examination. Cardiopulmonary assessment is negative for acute distress and she exhibits normal effort.   KPS = 100  100 - Normal; no complaints; no evidence of disease. 90   - Able to carry on normal activity; minor signs or symptoms of disease. 80   - Normal activity with effort; some signs or symptoms of disease. 66   - Cares for self; unable to carry on normal activity or to do active work. 60   - Requires occasional assistance, but is able to care for most of his personal  needs. 50   - Requires considerable assistance and frequent medical care. 40   - Disabled; requires special care and assistance. 30   - Severely disabled; hospital admission is indicated although death  not imminent. 20   - Very sick; hospital admission necessary; active supportive treatment necessary. 10   - Moribund; fatal processes progressing rapidly. 0     - Dead  Karnofsky DA, Abelmann WH, Craver LS and Burchenal [facility] [phone]) The use of the nitrogen mustards in the palliative treatment of carcinoma: with particular reference to bronchogenic carcinoma Cancer 1 634-56  LABORATORY DATA:  Lab Results  Component Value Date   WBC 7.4 08/22/2023   HGB 13.6 08/22/2023   HCT 42.1 08/22/2023   MCV 95.9 08/22/2023   PLT 275 08/22/2023   Lab Results  Component Value Date   NA 140 08/22/2023   K 3.8 08/22/2023   CL 104 08/22/2023   CO2 29 08/22/2023   Lab Results  Component Value Date   ALT 13 08/22/2023   AST 20 08/22/2023   ALKPHOS 60 08/22/2023   BILITOT 0.3 08/22/2023     RADIOGRAPHY: MR BRAIN W WO CONTRAST Result Date: 10/08/2023 CLINICAL DATA:  Brain metastases, assess treatment response. Short interval scan to follow-up on punctate untreated lesion. History of metastatic non-small cell lung cancer. EXAM: MRI HEAD WITHOUT AND WITH CONTRAST TECHNIQUE: Multiplanar, multiecho pulse sequences of the brain and surrounding structures were obtained without and with intravenous contrast. CONTRAST:  5 mL Vueway COMPARISON:  Head MRI 08/06/2023 FINDINGS: Brain: Punctate foci of enhancement at the right occipital resection site and along the margin of the occipital horn of the right lateral ventricle have not significantly changed, including the new punctate focus described on the prior study. Mild dural enhancement subjacent to the craniotomy is unchanged. No new enhancing brain lesions are identified. There is no evidence of an acute infarct, acute intracranial hemorrhage, mass, midline shift,  or significant extra-axial fluid collection. There is unchanged ex vacuo dilatation of the occipital horn of the right lateral ventricle. Scattered small T2 hyperintensities in the cerebral white matter bilaterally are unchanged and nonspecific but compatible with mild chronic small vessel ischemic disease. Confluent T2 hyperintensity/gliosis at the right occipital treatment site is unchanged with gliosis. 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 Vascular: Major intracranial vascular flow voids are preserved. Skull and upper cervical spine: Right occipital craniotomy. No suspicious marrow lesion. Sinuses/Orbits: Bilateral cataract extraction. Mild mucosal thickening in the left sphenoid sinus. Clear mastoid air cells. Other: None. IMPRESSION: Unchanged punctate foci of enhancement at the right occipital treatment site. No evidence of new or progressive intracranial metastatic disease. Electronically Signed   By: Sebastian Ache M.D.   On: 10/08/2023 13:00   NM PET Image Restage (PS) Skull Base to Thigh (F-18 FDG) Result Date: 09/12/2023 CLINICAL DATA:  Subsequent treatment strategy for non-small cell lung cancer. EXAM: NUCLEAR MEDICINE PET SKULL BASE TO THIGH TECHNIQUE: 5.8 mCi F-18 FDG was injected intravenously. Full-ring PET imaging was performed from the skull base to thigh after the radiotracer. CT data was obtained and used for attenuation correction and anatomic localization. Fasting blood glucose: 66 mg/dl COMPARISON:  11/91/4782 and CT exam of 08/22/2023 FINDINGS: Mediastinal blood pool activity: SUV max 2.7 Liver activity: SUV max NA NECK: No significant abnormal hypermetabolic activity in this region. Incidental CT findings: Bilateral common carotid atheromatous vascular calcifications. CHEST: 0.8 cm superior segment left lower lobe nodule on image 29 series 7 has maximum SUV of 7.7. Incidental CT findings: Right upper lobectomy. Centrilobular emphysema. Coronary, aortic arch, and  branch vessel atherosclerotic vascular disease. ABDOMEN/PELVIS: No significant abnormal hypermetabolic activity in this region. Incidental CT findings: Cholelithiasis. Atherosclerosis is present, including  aortoiliac atherosclerotic disease. Small supraumbilical hernia contains adipose tissue. Mild sigmoid colon diverticulosis. SKELETON: No significant abnormal hypermetabolic activity in this region. Incidental CT findings: None. IMPRESSION: 1. The 0.8 cm superior segment left lower lobe nodule has maximum SUV of 7.7, suspicious for malignancy. 2. Right upper lobectomy. 3. Centrilobular emphysema. 4. Cholelithiasis. 5. Small supraumbilical hernia contains adipose tissue. 6. Mild sigmoid colon diverticulosis. 7. Aortic, coronary, and systemic atherosclerosis. Aortic Atherosclerosis (ICD10-I70.0) and Emphysema (ICD10-J43.9). Electronically Signed   By: Gaylyn Rong M.D.   On: 09/12/2023 09:21      IMPRESSION/PLAN: 1. 73 y.o. 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  Today, we talked to the patient about the findings and workup thus far. We discussed the natural history of Stage IA NSCLC and general treatment, highlighting the role of radiotherapy in the management. We discussed the available radiation techniques, and focused on the details and logistics of delivery of stereotactic body radiotherapy (SBRT).  The recommendation is for a 5 fraction course of SBRT to the new LLL lung nodule.  We reviewed the anticipated acute and late sequelae associated with radiation in this setting and she was encouraged to ask questions that were answered to her stated satisfaction.  At the conclusion of our conversation, the patient is in agreement to proceed with the recommended 5 fraction course of SBRT to the new LLL lung nodule.  She has freely signed written consent to proceed today in  the office and a copy of this document will be placed in her medical record.  She is tentatively scheduled for CT simulation at 10 AM on Monday, 10/13/2023 so we will share our discussion with Dr. Arbutus Ped and proceed with treatment planning accordingly, in anticipation of beginning her treatments in the near future.  We enjoyed meeting with her again today and look forward to continuing to participate in her care.  2 History of solitary brain metastasis secondary to.stage IV NSCLC, adenocarcinoma of the right upper lobe lung. Dr. Kathrynn Running and I personally reviewed her recent MRI brain scan from 10/08/2023 which showed an unchanged appearance of the punctate focus at the right occipital treatment site with no evidence of new or progressive disease.  These results were reviewed with the patient today as well as the recommendation to continue to closely monitor with serial MRI brain scans every 3 months.  I will plan to call her following each scan to review the results and any recommendations.  She is comfortable and in agreement with this plan.  We personally spent 60 minutes in this encounter including chart review, reviewing radiological studies, meeting face-to-face with the patient, entering orders and completing documentation.    Marguarite Arbour, PA-C    Margaretmary Dys, MD  [facility]  Radiation Oncology Direct Dial: [phone]  Fax: [phone] [email or web address]  Skype  LinkedIn

## 2023-10-09 NOTE — Progress Notes (Signed)
Nursing interview for Recurrent Non-Small Cell Lung Cancer (NSCLC).  Patient identity verified x2.  Patient reports doing well. Patient denies any related issues at this time.  Meaningful use complete.  Vitals- BP 129/65 (BP Location: Left Arm, Patient Position: Sitting, Cuff Size: Normal)   Pulse 86   Temp 97.6 F (36.4 C)   Resp 18   Ht 5' 2.5" (1.588 m)   Wt 122 lb 6.4 oz (55.5 kg)   SpO2 97%   BMI 22.03 kg/m   This concludes the interaction.  Ruel Favors, LPN

## 2023-10-11 NOTE — Progress Notes (Signed)
  Radiation Oncology         ([phone] ________________________________  Name: [name] MRN: [id number]  Date: 10/13/2023  DOB: 03-26-1951  STEREOTACTIC BODY RADIOTHERAPY SIMULATION AND TREATMENT PLANNING NOTE    ICD-10-CM   1. Non-small cell carcinoma of left lung, stage 1 (HCC)  C34.92       DIAGNOSIS:  73 y/o woman with a new Stage IA, NSCLC in the left lower lobe lung   NARRATIVE:  The patient was brought to the CT Simulation planning suite.  Identity was confirmed.  All relevant records and images related to the planned course of therapy were reviewed.  The patient freely provided informed written consent to proceed with treatment after reviewing the details related to the planned course of therapy. The consent form was witnessed and verified by the simulation staff.  Then, the patient was set-up in a stable reproducible  supine position for radiation therapy.  A BodyFix immobilization pillow was fabricated for reproducible positioning.  Then I personally applied the abdominal compression paddle to limit respiratory excursion.  4D respiratoy motion management CT images were obtained.  Surface markings were placed.  The CT images were loaded into the planning software.  Then, using Cine, MIP, and standard views, the internal target volume (ITV) and planning target volumes (PTV) were delinieated, and avoidance structures were contoured.  Treatment planning then occurred.  The radiation prescription was entered and confirmed.  A total of two complex treatment devices were fabricated in the form of the BodyFix immobilization pillow and a neck accuform cushion.  I have requested : 3D Simulation  I have requested a DVH of the following structures: Heart, Lungs, Esophagus, Chest Wall, Brachial Plexus, Major Blood Vessels, and targets.  SPECIAL TREATMENT PROCEDURE:  The planned course of therapy using radiation constitutes a special treatment procedure. Special care is required in the  management of this patient for the following reasons. This treatment constitutes a Special Treatment Procedure for the following reason: [ High dose per fraction requiring special monitoring for increased toxicities of treatment including daily imaging..  The special nature of the planned course of radiotherapy will require increased physician supervision and oversight to ensure patient's safety with optimal treatment outcomes.  This requires extended time and effort.    RESPIRATORY MOTION MANAGEMENT SIMULATION:  In order to account for effect of respiratory motion on target structures and other organs in the planning and delivery of radiotherapy, this patient underwent respiratory motion management simulation.  To accomplish this, when the patient was brought to the CT simulation planning suite, 4D respiratoy motion management CT images were obtained.  The CT images were loaded into the planning software.  Then, using a variety of tools including Cine, MIP, and standard views, the target volume and planning target volumes (PTV) were delineated.  Avoidance structures were contoured.  Treatment planning then occurred.  Dose volume histograms were generated and reviewed for each of the requested structure.  The resulting plan was carefully reviewed and approved today.  PLAN:  The patient will receive 60 Gy in 5 fractions.  ________________________________  [name], M.D.

## 2023-10-13 ENCOUNTER — Ambulatory Visit
Admission: RE | Admit: 2023-10-13 | Discharge: 2023-10-13 | Disposition: A | Discharge status: Home / Self Care | Payer: Medicare Other | Source: Ambulatory Visit | Attending: Urology | Admitting: Urology

## 2023-10-13 DIAGNOSIS — C3432 Malignant neoplasm of lower lobe, left bronchus or lung: Secondary | ICD-10-CM | POA: Diagnosis present

## 2023-10-13 DIAGNOSIS — C3492 Malignant neoplasm of unspecified part of left bronchus or lung: Secondary | ICD-10-CM

## 2023-10-15 ENCOUNTER — Ambulatory Visit: Payer: Medicare Other | Admitting: Urology

## 2023-10-16 DIAGNOSIS — C3432 Malignant neoplasm of lower lobe, left bronchus or lung: Secondary | ICD-10-CM | POA: Diagnosis not present

## 2023-10-20 ENCOUNTER — Other Ambulatory Visit: Payer: Self-pay

## 2023-10-20 ENCOUNTER — Ambulatory Visit
Admission: RE | Admit: 2023-10-20 | Discharge: 2023-10-20 | Disposition: A | Discharge status: Home / Self Care | Payer: Medicare Other | Source: Ambulatory Visit | Attending: Radiation Oncology | Admitting: Radiation Oncology

## 2023-10-20 DIAGNOSIS — C3432 Malignant neoplasm of lower lobe, left bronchus or lung: Secondary | ICD-10-CM | POA: Insufficient documentation

## 2023-10-20 LAB — RAD ONC ARIA SESSION SUMMARY
Course Elapsed Days: 0
Plan Fractions Treated to Date: 1
Plan Prescribed Dose Per Fraction: 12 Gy
Plan Total Fractions Prescribed: 5
Plan Total Prescribed Dose: 60 Gy
Reference Point Dosage Given to Date: 12 Gy
Reference Point Session Dosage Given: 12 Gy
Session Number: 1

## 2023-10-21 ENCOUNTER — Ambulatory Visit: Payer: Medicare Other

## 2023-10-22 ENCOUNTER — Ambulatory Visit: Payer: Medicare Other

## 2023-10-23 ENCOUNTER — Other Ambulatory Visit: Payer: Self-pay

## 2023-10-23 ENCOUNTER — Ambulatory Visit
Admission: RE | Admit: 2023-10-23 | Discharge: 2023-10-23 | Disposition: A | Discharge status: Home / Self Care | Payer: Medicare Other | Source: Ambulatory Visit | Attending: Radiation Oncology | Admitting: Radiation Oncology

## 2023-10-23 DIAGNOSIS — C3432 Malignant neoplasm of lower lobe, left bronchus or lung: Secondary | ICD-10-CM | POA: Diagnosis not present

## 2023-10-23 LAB — RAD ONC ARIA SESSION SUMMARY
Course Elapsed Days: 3
Plan Fractions Treated to Date: 2
Plan Prescribed Dose Per Fraction: 12 Gy
Plan Total Fractions Prescribed: 5
Plan Total Prescribed Dose: 60 Gy
Reference Point Dosage Given to Date: 24 Gy
Reference Point Session Dosage Given: 12 Gy
Session Number: 2

## 2023-10-24 ENCOUNTER — Ambulatory Visit: Payer: Medicare Other

## 2023-10-27 ENCOUNTER — Other Ambulatory Visit: Payer: Self-pay

## 2023-10-27 ENCOUNTER — Ambulatory Visit
Admission: RE | Admit: 2023-10-27 | Discharge: 2023-10-27 | Disposition: A | Discharge status: Home / Self Care | Payer: Medicare Other | Source: Ambulatory Visit | Attending: Radiation Oncology | Admitting: Radiation Oncology

## 2023-10-27 DIAGNOSIS — C3432 Malignant neoplasm of lower lobe, left bronchus or lung: Secondary | ICD-10-CM | POA: Diagnosis not present

## 2023-10-27 LAB — RAD ONC ARIA SESSION SUMMARY
Course Elapsed Days: 7
Plan Fractions Treated to Date: 3
Plan Prescribed Dose Per Fraction: 12 Gy
Plan Total Fractions Prescribed: 5
Plan Total Prescribed Dose: 60 Gy
Reference Point Dosage Given to Date: 36 Gy
Reference Point Session Dosage Given: 12 Gy
Session Number: 3

## 2023-10-29 ENCOUNTER — Ambulatory Visit
Admission: RE | Admit: 2023-10-29 | Discharge: 2023-10-29 | Disposition: A | Discharge status: Home / Self Care | Payer: Medicare Other | Source: Ambulatory Visit | Attending: Radiation Oncology | Admitting: Radiation Oncology

## 2023-10-29 ENCOUNTER — Other Ambulatory Visit: Payer: Self-pay

## 2023-10-29 DIAGNOSIS — C3432 Malignant neoplasm of lower lobe, left bronchus or lung: Secondary | ICD-10-CM | POA: Diagnosis not present

## 2023-10-29 LAB — RAD ONC ARIA SESSION SUMMARY
Course Elapsed Days: 9
Plan Fractions Treated to Date: 4
Plan Prescribed Dose Per Fraction: 12 Gy
Plan Total Fractions Prescribed: 5
Plan Total Prescribed Dose: 60 Gy
Reference Point Dosage Given to Date: 48 Gy
Reference Point Session Dosage Given: 12 Gy
Session Number: 4

## 2023-10-31 ENCOUNTER — Other Ambulatory Visit: Payer: Self-pay

## 2023-10-31 ENCOUNTER — Ambulatory Visit
Admission: RE | Admit: 2023-10-31 | Discharge: 2023-10-31 | Disposition: A | Discharge status: Home / Self Care | Payer: Medicare Other | Source: Ambulatory Visit | Attending: Radiation Oncology | Admitting: Radiation Oncology

## 2023-10-31 DIAGNOSIS — C3492 Malignant neoplasm of unspecified part of left bronchus or lung: Secondary | ICD-10-CM

## 2023-10-31 DIAGNOSIS — C3432 Malignant neoplasm of lower lobe, left bronchus or lung: Secondary | ICD-10-CM | POA: Diagnosis not present

## 2023-10-31 LAB — RAD ONC ARIA SESSION SUMMARY
Course Elapsed Days: 11
Plan Fractions Treated to Date: 5
Plan Prescribed Dose Per Fraction: 12 Gy
Plan Total Fractions Prescribed: 5
Plan Total Prescribed Dose: 60 Gy
Reference Point Dosage Given to Date: 60 Gy
Reference Point Session Dosage Given: 12 Gy
Session Number: 5

## 2023-11-03 NOTE — Radiation Completion Notes (Addendum)
  Radiation Oncology         [phone] ________________________________  Name: [name] MRN: [id number]  Date: 10/31/2023  DOB: March 10, 1951  Referring Physician: Si Gaul, M.D. Date of Service: 2023-11-03 Radiation Oncologist: Margaretmary Bayley, M.D. 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  She tolerated the radiation treatment relatively well with only modest fatigue.  The patient will receive a call in about one month from the radiation oncology department. She will continue follow up with her medical oncologist, Dr. Arbutus Ped as well.  ------------------------------------------------   Margaretmary Dys, MD [facility]  Radiation Oncology Direct Dial: [phone]  Fax: [phone] [email or web address]  Skype  LinkedIn

## 2023-12-02 ENCOUNTER — Ambulatory Visit
Admission: RE | Admit: 2023-12-02 | Discharge: 2023-12-02 | Disposition: A | Discharge status: Home / Self Care | Payer: Medicare Other | Source: Ambulatory Visit | Attending: Internal Medicine | Admitting: Internal Medicine

## 2023-12-02 DIAGNOSIS — C3432 Malignant neoplasm of lower lobe, left bronchus or lung: Secondary | ICD-10-CM | POA: Insufficient documentation

## 2023-12-02 NOTE — Progress Notes (Signed)
  Radiation Oncology         [phone] ________________________________  Name: [name] MRN: [id number]  Date of Service: 12/02/2023  DOB: 10-Apr-1951  Post Treatment Telephone Note  Diagnosis:  C34.11 Malignant neoplasm of upper lobe, right bronchus or lung (as documented in provider EOT note)  The patient was not available for call today. Voicemail left.   The patient has scheduled follow up with her medical oncologist Dr. Arbutus Ped for ongoing care, and was encouraged to call if she develops concerns or questions regarding radiation.    Ruel Favors, LPN

## 2024-01-03 ENCOUNTER — Other Ambulatory Visit: Payer: Self-pay | Admitting: Physician Assistant

## 2024-01-05 ENCOUNTER — Other Ambulatory Visit: Payer: Self-pay | Admitting: Physician Assistant

## 2024-01-05 ENCOUNTER — Ambulatory Visit
Admission: RE | Admit: 2024-01-05 | Discharge: 2024-01-05 | Disposition: A | Discharge status: Home / Self Care | Payer: Medicare Other | Source: Ambulatory Visit | Attending: Radiation Oncology | Admitting: Radiation Oncology

## 2024-01-05 DIAGNOSIS — E039 Hypothyroidism, unspecified: Secondary | ICD-10-CM

## 2024-01-05 DIAGNOSIS — C7931 Secondary malignant neoplasm of brain: Secondary | ICD-10-CM

## 2024-01-05 MED ORDER — GADOPICLENOL 0.5 MMOL/ML IV SOLN
5.5000 mL | Freq: Once | INTRAVENOUS | Status: AC | PRN
  Administered 2024-01-05: 5.5 mL via INTRAVENOUS

## 2024-01-06 ENCOUNTER — Inpatient Hospital Stay: Payer: Medicare Other | Attending: Internal Medicine

## 2024-01-06 ENCOUNTER — Ambulatory Visit (HOSPITAL_COMMUNITY)
Admission: RE | Admit: 2024-01-06 | Discharge: 2024-01-06 | Disposition: A | Discharge status: Home / Self Care | Payer: Medicare Other | Source: Ambulatory Visit | Attending: Internal Medicine | Admitting: Internal Medicine

## 2024-01-06 DIAGNOSIS — K8020 Calculus of gallbladder without cholecystitis without obstruction: Secondary | ICD-10-CM | POA: Diagnosis not present

## 2024-01-06 DIAGNOSIS — K760 Fatty (change of) liver, not elsewhere classified: Secondary | ICD-10-CM | POA: Diagnosis not present

## 2024-01-06 DIAGNOSIS — C3411 Malignant neoplasm of upper lobe, right bronchus or lung: Secondary | ICD-10-CM | POA: Insufficient documentation

## 2024-01-06 DIAGNOSIS — Z9889 Other specified postprocedural states: Secondary | ICD-10-CM | POA: Insufficient documentation

## 2024-01-06 DIAGNOSIS — E039 Hypothyroidism, unspecified: Secondary | ICD-10-CM | POA: Insufficient documentation

## 2024-01-06 DIAGNOSIS — C3490 Malignant neoplasm of unspecified part of unspecified bronchus or lung: Secondary | ICD-10-CM | POA: Diagnosis present

## 2024-01-06 DIAGNOSIS — R918 Other nonspecific abnormal finding of lung field: Secondary | ICD-10-CM | POA: Diagnosis not present

## 2024-01-06 DIAGNOSIS — K5730 Diverticulosis of large intestine without perforation or abscess without bleeding: Secondary | ICD-10-CM | POA: Insufficient documentation

## 2024-01-06 LAB — CMP (CANCER CENTER ONLY)
ALT: 11 U/L (ref 0–44)
AST: 19 U/L (ref 15–41)
Albumin: 4.4 g/dL (ref 3.5–5.0)
Alkaline Phosphatase: 60 U/L (ref 38–126)
Anion gap: 5 (ref 5–15)
BUN: 8 mg/dL (ref 8–23)
CO2: 30 mmol/L (ref 22–32)
Calcium: 9.4 mg/dL (ref 8.9–10.3)
Chloride: 106 mmol/L (ref 98–111)
Creatinine: 0.55 mg/dL (ref 0.44–1.00)
GFR, Estimated: >60 mL/min (ref >60)
Glucose, Bld: 94 mg/dL (ref 70–99)
Potassium: 3.9 mmol/L (ref 3.5–5.1)
Sodium: 141 mmol/L (ref 135–145)
Total Bilirubin: 0.3 mg/dL (ref 0.0–1.2)
Total Protein: 7 g/dL (ref 6.5–8.1)

## 2024-01-06 LAB — CBC WITH DIFFERENTIAL (CANCER CENTER ONLY)
Abs Immature Granulocytes: 0.03 10*3/uL (ref 0.00–0.07)
Basophils Absolute: 0 10*3/uL (ref 0.0–0.1)
Basophils Relative: 1 %
Eosinophils Absolute: 0 10*3/uL (ref 0.0–0.5)
Eosinophils Relative: 1 %
HCT: 37.2 % (ref 36.0–46.0)
Hemoglobin: 12.4 g/dL (ref 12.0–15.0)
Immature Granulocytes: 1 %
Lymphocytes Relative: 22 %
Lymphs Abs: 1.4 10*3/uL (ref 0.7–4.0)
MCH: 31.5 pg (ref 26.0–34.0)
MCHC: 33.3 g/dL (ref 30.0–36.0)
MCV: 94.4 fL (ref 80.0–100.0)
Monocytes Absolute: 0.5 10*3/uL (ref 0.1–1.0)
Monocytes Relative: 8 %
Neutro Abs: 4.5 10*3/uL (ref 1.7–7.7)
Neutrophils Relative %: 67 %
Platelet Count: 236 10*3/uL (ref 150–400)
RBC: 3.94 MIL/uL (ref 3.87–5.11)
RDW: 12.1 % (ref 11.5–15.5)
WBC Count: 6.5 10*3/uL (ref 4.0–10.5)
nRBC: 0 % (ref 0.0–0.2)

## 2024-01-06 LAB — TSH: TSH: 2.54 u[IU]/mL (ref 0.350–4.500)

## 2024-01-06 MED ORDER — IOHEXOL 9 MG/ML PO SOLN
1000.0000 mL | ORAL | Status: AC
  Administered 2024-01-06: 1000 mL via ORAL

## 2024-01-06 MED ORDER — IOHEXOL 9 MG/ML PO SOLN
ORAL | Status: AC
  Filled 2024-01-06: qty 1000

## 2024-01-06 MED ORDER — SODIUM CHLORIDE (PF) 0.9 % IJ SOLN
INTRAMUSCULAR | Status: AC
  Filled 2024-01-06: qty 50

## 2024-01-06 MED ORDER — IOHEXOL 300 MG/ML  SOLN
100.0000 mL | Freq: Once | INTRAMUSCULAR | Status: AC | PRN
  Administered 2024-01-06: 100 mL via INTRAVENOUS

## 2024-01-13 ENCOUNTER — Encounter: Payer: Self-pay | Admitting: Urology

## 2024-01-13 NOTE — Progress Notes (Signed)
 Telephone nursing appointment for review of most recent MRI Brain results. I verified patient's identity x2 and began nursing interview.    Patient states issues as follows... -Pain: Denies  -Fatigue: Denies -Weakness or loss of control of the extremities: Denies -Headache: Yes- Rare occasion 5/10 -Seizure: Denies -Vision: Denies -Memory: Denies -Confusion: Denies -Skin: Denies -Cardiac: Denies -Lungs: Denies -Dexamethasone : Denies steroids   Patient denies any related issues at this time.   Meaningful use complete.   Patient aware of their telephone appointment w/ Ashlyn Bruning PA-C. I left my extension [phone] in case patient needs anything. Patient verbalized understanding. This concludes the nursing interview.   Patient preferred phone # [phone]    Avery Bodo, LPN

## 2024-01-14 ENCOUNTER — Other Ambulatory Visit: Payer: Self-pay | Admitting: Radiation Therapy

## 2024-01-14 ENCOUNTER — Ambulatory Visit
Admission: RE | Admit: 2024-01-14 | Discharge: 2024-01-14 | Disposition: A | Discharge status: Home / Self Care | Payer: Medicare Other | Source: Ambulatory Visit | Attending: Urology | Admitting: Urology

## 2024-01-14 DIAGNOSIS — C7931 Secondary malignant neoplasm of brain: Secondary | ICD-10-CM

## 2024-01-14 NOTE — Progress Notes (Signed)
 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   Interval Since Last Radiation:  2 years 10/20/23 - 10/31/23//SBRT: The LUL lung nodule was treated to 60 Gy in 5 fractions of 12 Gy each  01/03/22, 01/07/22, 01/09/22: Fractionated Pre-OP SRS PTV1: The right occipital 35 mm target was treated 24 Gy in 3 fractions of 8 Gy each.    Narrative:  I spoke with the patient to conduct her routine scheduled 3 month follow up visit to review her recent MRI brain results via telephone to spare the patient unnecessary potential exposure in the healthcare setting during the current COVID-19 pandemic.  The patient was notified in advance and gave permission to proceed with this visit format.   She tolerated her pre-op radiation treatment well and subsequently underwent surgical resection under the care of Dr. Rochelle Chu on 01/11/2022 and has recovered well from her surgery. She also tolerated the recent SBRT treatments to the LLL lung nodule completed 10/31/23 and tolerated very well. She is off systemic therapy and being followed in observation only with Dr. Marguerita Shih. Her most recent restaging CT C/A/P from 01/06/24 was without any definite evidence of disease recurrence or progression. The recently treated superior segment left lower lobe lung nodule appears smaller today, indicating an excellent response to treatment. However, there is a new nodule just lateral to this in the superior segment left lower lobe which will need close monitoring.  She continues on observation only and is scheduled for a follow-up visit with Dr. Marguerita Shih on 01/20/24 to review these results and  recommendations.  Overall, she is quite pleased with her progress to date.  Most recent brain MRI from 01/05/24 shows minimal irregular/punctate enhancement in the region of prior resection that appears stable over time with no evidence of increasing enhancement, mass effect or increasing T2/FLAIR signal. We reviewed her case at our tumor board and the consensus is that there is no evidence of residual or recurrent disease at this site and no other foci of suspected tumor noted.  On review of systems, the patient states that she is doing well in general.  She specifically denies chest pain, shortness of breath, productive cough, hemoptysis, headaches, nausea, vomiting, dizziness/imbalance or any changes in her visual or auditory acuity. She does have some mild decrease in her short term memory but feels like this is consistent with her age, no acute changes.   ALLERGIES:  has no known allergies.  Meds: Current Outpatient Medications  Medication Sig Dispense Refill   acetaminophen  (TYLENOL ) 325 MG tablet Take 1-2 tablets (325-650 mg total) by mouth every 4 (four) hours as needed for mild pain.     albuterol  (PROVENTIL  HFA;VENTOLIN  HFA) 108 (90 Base) MCG/ACT inhaler Inhale 2 puffs into the lungs every 4 (four) hours as needed for wheezing or shortness of breath (cough, shortness of breath or wheezing.). 1 Inhaler 1   alendronate (FOSAMAX) 70 MG tablet Take 70 mg by mouth once a week.     Cholecalciferol  (VITAMIN D3 PO) Take 2,000 Units by mouth daily.     fluticasone  (FLONASE ) 50 MCG/ACT nasal spray Place 2 sprays into both nostrils  daily. 16 g 12   Fluticasone -Salmeterol (ADVAIR) 250-50 MCG/DOSE AEPB Inhale 1 puff into the lungs 2 (two) times daily.      levothyroxine  (SYNTHROID ) 75 MCG tablet TAKE 1 TABLET BY MOUTH EVERY DAY BEFORE BREAKFAST 30 tablet 2   Multiple Vitamin (MULTIVITAMIN) tablet Take 1 tablet by mouth daily.     No current facility-administered medications for this encounter.     Physical Findings:  vitals were not taken for this visit.  Pain Assessment Pain Score: 0-No pain/10 Unable to assess due to telephone follow-up visit format.  Lab Findings: Lab Results  Component Value Date   WBC 6.5 01/06/2024   HGB 12.4 01/06/2024   HCT 37.2 01/06/2024   MCV 94.4 01/06/2024   PLT 236 01/06/2024     Radiographic Findings: CT Chest W Contrast Result Date: 01/07/2024 CLINICAL DATA:  Staging non-small-cell lung cancer. Right upper lobe. * Tracking Code: BO * EXAM: CT CHEST, ABDOMEN, AND PELVIS WITH CONTRAST TECHNIQUE: Multidetector CT imaging of the chest, abdomen and pelvis was performed following the standard protocol during bolus administration of intravenous contrast. RADIATION DOSE REDUCTION: This exam was performed according to the departmental dose-optimization program which includes automated exposure control, adjustment of the mA and/or kV according to patient size and/or use of iterative reconstruction technique. CONTRAST:  OMNIPAQUE  IOHEXOL  300 MG/ML  SOLN COMPARISON:  PET-CT 09/11/2023. Chest CT 08/22/2023. Older CT examinations as well. FINDINGS: CT CHEST FINDINGS Cardiovascular: The thoracic aorta is normal course and caliber with slight partially calcified atherosclerotic plaque. There is some calcified plaque along the great vessels as well. Heart is nonenlarged. No pericardial effusion. Coronary artery calcifications are seen. Mediastinum/Nodes: Slightly patulous thoracic esophagus. Small thyroid  gland. No specific abnormal lymph node enlargement identified in the axillary regions, hilum or mediastinum. Lungs/Pleura: Stable volume loss along the right hemithorax with some scarring and surgical changes from lobectomy. There is some debris along right lower lobe bronchi. No consolidation, pneumothorax or effusion on the right. No new dominant lung nodule. Left lung is without consolidation, pneumothorax or effusion. In the superior segment of the left  lower lobe is a spiculated nodule again seen measuring 8 mm on series 4, image 50. This lesion was not seen on the prior CT scan of December 2024. This lesion was also not present on the PET-CT scan of December 2024. The superior segment lesion medially in the left upper lobe which previously was hypermetabolic and had dimension of 10 mm, today is smaller on series 4 image 48 measuring 6 mm. Stable 2 mm lingular nodule inferiorly on image 103. Stable 3 mm lingular nodule on image 81. Musculoskeletal: Mild degenerative changes along the spine. CT ABDOMEN PELVIS FINDINGS Hepatobiliary: Multiple stones in the gallbladder. Mild fatty liver infiltration. Tiny low-attenuation segment 6 check cystic focus, too small to completely characterize but likely a benign cystic lesion. Patent portal vein. Pancreas: Unremarkable. No pancreatic ductal dilatation or surrounding inflammatory changes. Spleen: Normal in size without focal abnormality. Adrenals/Urinary Tract: Adrenal glands are preserved. No enhancing renal mass or collecting system dilatation. Small parapelvic and parenchymal renal cystic foci. The ureters have a normal course and caliber down to the normal caliber urinary bladder. Stomach/Bowel: Oral contrast was administered. Large bowel has a normal course and caliber. Left-sided colonic diverticula. Surgical changes in the area of the cecum. The appendix is not seen. Stomach and small bowel are nondilated. Vascular/Lymphatic: Aortic atherosclerosis. No enlarged abdominal or pelvic lymph nodes. Reproductive: Uterus and bilateral adnexa are unremarkable. Other: No free  air or free fluid. Small midline anterior abdominal wall fat containing hernia above umbilicus. Musculoskeletal: Curvature and degenerative changes along the spine. Old right eleventh rib fracture. Heterogeneous appearance of the femoral heads. Please correlate for AVN. This appears more pronounced than the CT scan of February 2024. IMPRESSION: Superior  segment left lower lobe lung nodule seen on the prior examination which was hypermetabolic appears smaller today. However there is a new nodule just lateral to this in the superior segment left lower lobe. No additional new dominant lung nodules. Few tiny nodules elsewhere. No developing lymph node enlargement. Surgical changes of prior right upper lobectomy. Gallstones. Colonic diverticula.  Fatty liver infiltration. Heterogeneous appearance of the femoral heads bilaterally. Please correlate for history of AVN or other process. Electronically Signed   By: Adrianna Horde M.D.   On: 01/07/2024 13:09   CT ABDOMEN PELVIS W CONTRAST Result Date: 01/07/2024 CLINICAL DATA:  Staging non-small-cell lung cancer. Right upper lobe. * Tracking Code: BO * EXAM: CT CHEST, ABDOMEN, AND PELVIS WITH CONTRAST TECHNIQUE: Multidetector CT imaging of the chest, abdomen and pelvis was performed following the standard protocol during bolus administration of intravenous contrast. RADIATION DOSE REDUCTION: This exam was performed according to the departmental dose-optimization program which includes automated exposure control, adjustment of the mA and/or kV according to patient size and/or use of iterative reconstruction technique. CONTRAST:  OMNIPAQUE  IOHEXOL  300 MG/ML  SOLN COMPARISON:  PET-CT 09/11/2023. Chest CT 08/22/2023. Older CT examinations as well. FINDINGS: CT CHEST FINDINGS Cardiovascular: The thoracic aorta is normal course and caliber with slight partially calcified atherosclerotic plaque. There is some calcified plaque along the great vessels as well. Heart is nonenlarged. No pericardial effusion. Coronary artery calcifications are seen. Mediastinum/Nodes: Slightly patulous thoracic esophagus. Small thyroid  gland. No specific abnormal lymph node enlargement identified in the axillary regions, hilum or mediastinum. Lungs/Pleura: Stable volume loss along the right hemithorax with some scarring and surgical changes from  lobectomy. There is some debris along right lower lobe bronchi. No consolidation, pneumothorax or effusion on the right. No new dominant lung nodule. Left lung is without consolidation, pneumothorax or effusion. In the superior segment of the left lower lobe is a spiculated nodule again seen measuring 8 mm on series 4, image 50. This lesion was not seen on the prior CT scan of December 2024. This lesion was also not present on the PET-CT scan of December 2024. The superior segment lesion medially in the left upper lobe which previously was hypermetabolic and had dimension of 10 mm, today is smaller on series 4 image 48 measuring 6 mm. Stable 2 mm lingular nodule inferiorly on image 103. Stable 3 mm lingular nodule on image 81. Musculoskeletal: Mild degenerative changes along the spine. CT ABDOMEN PELVIS FINDINGS Hepatobiliary: Multiple stones in the gallbladder. Mild fatty liver infiltration. Tiny low-attenuation segment 6 check cystic focus, too small to completely characterize but likely a benign cystic lesion. Patent portal vein. Pancreas: Unremarkable. No pancreatic ductal dilatation or surrounding inflammatory changes. Spleen: Normal in size without focal abnormality. Adrenals/Urinary Tract: Adrenal glands are preserved. No enhancing renal mass or collecting system dilatation. Small parapelvic and parenchymal renal cystic foci. The ureters have a normal course and caliber down to the normal caliber urinary bladder. Stomach/Bowel: Oral contrast was administered. Large bowel has a normal course and caliber. Left-sided colonic diverticula. Surgical changes in the area of the cecum. The appendix is not seen. Stomach and small bowel are nondilated. Vascular/Lymphatic: Aortic atherosclerosis. No enlarged abdominal or pelvic lymph  nodes. Reproductive: Uterus and bilateral adnexa are unremarkable. Other: No free air or free fluid. Small midline anterior abdominal wall fat containing hernia above umbilicus.  Musculoskeletal: Curvature and degenerative changes along the spine. Old right eleventh rib fracture. Heterogeneous appearance of the femoral heads. Please correlate for AVN. This appears more pronounced than the CT scan of February 2024. IMPRESSION: Superior segment left lower lobe lung nodule seen on the prior examination which was hypermetabolic appears smaller today. However there is a new nodule just lateral to this in the superior segment left lower lobe. No additional new dominant lung nodules. Few tiny nodules elsewhere. No developing lymph node enlargement. Surgical changes of prior right upper lobectomy. Gallstones. Colonic diverticula.  Fatty liver infiltration. Heterogeneous appearance of the femoral heads bilaterally. Please correlate for history of AVN or other process. Electronically Signed   By: Adrianna Horde M.D.   On: 01/07/2024 13:09   MR Brain W Wo Contrast Result Date: 01/06/2024 CLINICAL DATA:  Brain metastases. Assess treatment response. Follow-up of untreated punctate lesion. EXAM: MRI HEAD WITHOUT AND WITH CONTRAST TECHNIQUE: Multiplanar, multiecho pulse sequences of the brain and surrounding structures were obtained without and with intravenous contrast. CONTRAST:  5.5 cc [employer]way  COMPARISON:  10/08/2023.  08/06/2023. FINDINGS: Brain: I think the examination remains stable. Diffusion imaging remains normal. No abnormality affects the brainstem. 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 Cerebral hemispheres show mild chronic small-vessel ischemic change of the white matter which is unchanged. Previous right occipital craniotomy and tumor resection. Volume loss and gliosis in that region. Minimal irregular/punctate enhancement in the region of resection is stable over time with no evidence of increasing enhancement, mass effect or increasing T2/FLAIR signal. Therefore, I think the study is negative for evidence of residual or recurrent disease at this site. No other  focus of suspected tumor is noted. Mild enhancement associated with a left frontal venous angioma as seen previously. Vascular: Major vessels at the base of the brain show flow. Skull and upper cervical spine: Negative Sinuses/Orbits: Clear/normal Other: None IMPRESSION: 1. Stable examination. Previous right occipital craniotomy and tumor resection. Volume loss and gliosis in that region. Minimal irregular/punctate enhancement in the region of resection is stable over time with no evidence of increasing enhancement, mass effect or increasing T2/FLAIR signal. Therefore, I think the study is negative for evidence of residual or recurrent disease at this site. No other focus of suspected tumor is noted. 2. 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 Mild chronic small-vessel ischemic change of the cerebral hemispheric white matter, stable. Electronically Signed   By: Bettylou Brunner M.D.   On: 01/06/2024 15:01    Impression/Plan: 1. 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  She has recovered well from the effects of her preop SRS treatment and remains without complaints. We reviewed the results of her recent follow-up MRI brain scan from 01/05/24 showing minimal irregular/punctate enhancement in the region of prior resection that appears stable over time with no evidence of increasing enhancement, mass effect or increasing T2/FLAIR signal. We reviewed her case at our tumor board and the consensus is that there is no evidence of residual or recurrent disease at this site and no other foci of suspected tumor noted.  Her most recent restaging systemic imaging from 01/06/24 was without any definite evidence of disease recurrence or progression. The recently  treated superior segment left lower lobe lung nodule appears smaller today, indicating an excellent response to  treatment. However, there is a new nodule just lateral to this in the superior segment left lower lobe which will need close monitoring.  She continues on observation only and is scheduled for a follow-up visit with Dr. Marguerita Shih on 01/20/24 to review these results and recommendations.   We reviewed the results of her recent MRI brain scan today by telephone. We will plan to continue with serial MRI brain imaging with a repeat scan in 3 months and if this remains stable at that time, we may consider increasing the time interval to 6 month MRI brain scans. She appears to have a good understanding of her disease and our recommendations and is comfortable and in agreement with the stated plan. She knows that she is welcome to call at anytime in the interim with any questions or concerns.  I personally spent 20 minutes in this encounter including chart review, reviewing radiological studies, telephone conversation with the patient, entering orders and completing documentation.   Arta Bihari, MMS, PA-C [facility] Radiation Oncology Physician Assistant Direct Dial: [phone]  Fax: [phone]

## 2024-01-20 ENCOUNTER — Inpatient Hospital Stay: Payer: Medicare Other | Attending: Internal Medicine | Admitting: Internal Medicine

## 2024-01-20 VITALS — BP 114/62 | HR 72 | Temp 98.2°F | Resp 18 | Wt 119.0 lb

## 2024-01-20 DIAGNOSIS — Z85118 Personal history of other malignant neoplasm of bronchus and lung: Secondary | ICD-10-CM | POA: Diagnosis present

## 2024-01-20 DIAGNOSIS — C3490 Malignant neoplasm of unspecified part of unspecified bronchus or lung: Secondary | ICD-10-CM

## 2024-01-20 DIAGNOSIS — Z9221 Personal history of antineoplastic chemotherapy: Secondary | ICD-10-CM | POA: Diagnosis not present

## 2024-01-20 NOTE — Progress Notes (Signed)
 [facility] [phone]   [phone]  OFFICE PROGRESS NOTE  Christina Humphreys, [place] [address] [place] [address]  DIAGNOSIS: Metastatic non-small cell lung cancer initially diagnosed as stage IIIA (T1b, N2, M0) non-small cell lung cancer, adenocarcinoma presented with right upper lobe lung nodule as well as right upper paratracheal adenopathy and suspicious tiny nodule in the right middle lobe.  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  First dose 05/30/2020. Status post 3 cycles. 2) status post robotic assisted right thoracoscopy with right upper lobectomy and lymph node dissection under the care of Dr. Luna Salinas on October 19, 2020.  There was residual tumor measuring 0.5 cm with lymph node metastasis involving 4R, 12 and hilar lymph nodes. 3) Adjuvant systemic chemotherapy with carboplatin  for AUC of 5 and Alimta  500 Mg/M2 every 3 weeks.  First dose was given December 11, 2020.  Status post 4 cycles.  Last dose of chemotherapy was given Feb 13, 2021. 4) status post right posterior parietal craniotomy with resection of the brain mass under the care of Dr. Waymond Hailey followed by [facility] to the solitary brain metastasis under the care of Dr. Lorri Rota.  The final pathology was consistent with metastatic adenocarcinoma of lung primary. 5) Adjuvant immunotherapy with Atezolizumab  1200 Mg IV every 3 weeks.  First dose March 27, 2021.  Status post 11 cycles.  Last cycle was given on November 12, 2021 this treatment was discontinued secondary to disease progression in the brain. 6) SBRT to treat the left lung nodule under the care of Dr. Lorri Rota completed October 31, 2023.   CURRENT THERAPY: Observation  INTERVAL HISTORY: [name] 73 y.o.  female returns to the clinic today for 74-month follow-up visit.Discussed the use of AI scribe software for clinical note transcription with the patient, who gave verbal consent to proceed.  History of Present Illness   [name] is a 73 year old female with metastatic non-small cell lung cancer who presents for evaluation and repeat CT scan for restaging of her disease.  She has a history of metastatic non-small cell lung cancer, initially diagnosed as stage IIIA adenocarcinoma in September 2021. In April 2023, she developed a solitary brain metastasis, which was treated with a posterior parietal right craniotomy and resection followed by stereotactic radiosurgery [facility]).  In February 2025, she underwent stereotactic body radiation therapy (SBRT) to a left lower lobe lung nodule. Since the treatment, she has experienced no new symptoms, including no chest pain, breathing issues, nausea, vomiting, diarrhea, constipation, headaches, or changes in vision.  A recent CT scan of the chest, abdomen, and pelvis showed that the previously treated nodule is smaller, but there is a new tiny nodule inferior to the treated area.       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  MEDICATIONS:  Current Outpatient Medications  Medication Sig Dispense Refill   acetaminophen  (TYLENOL ) 325 MG tablet Take 1-2 tablets (325-650 mg total) by mouth every 4 (four) hours as needed for mild pain.     albuterol  (PROVENTIL  HFA;VENTOLIN  HFA) 108 (90 Base)  MCG/ACT inhaler Inhale 2 puffs into the lungs every 4 (four) hours as needed for wheezing or shortness of breath (cough, shortness of breath or wheezing.). 1 Inhaler 1   alendronate (FOSAMAX) 70 MG tablet Take 70 mg by mouth once a week.     Cholecalciferol  (VITAMIN D3 PO) Take 2,000 Units by mouth daily.      fluticasone  (FLONASE ) 50 MCG/ACT nasal spray Place 2 sprays into both nostrils daily. 16 g 12   Fluticasone -Salmeterol (ADVAIR) 250-50 MCG/DOSE AEPB Inhale 1 puff into the lungs 2 (two) times daily.      levothyroxine  (SYNTHROID ) 75 MCG tablet TAKE 1 TABLET BY MOUTH EVERY DAY BEFORE BREAKFAST 30 tablet 2   Multiple Vitamin (MULTIVITAMIN) tablet Take 1 tablet by mouth daily.     No current facility-administered medications for this visit.    SURGICAL HISTORY:  Past Surgical History:  Procedure Laterality Date   APPENDECTOMY  2008   APPLICATION OF CRANIAL NAVIGATION Right 01/11/2022   Procedure: APPLICATION OF CRANIAL NAVIGATION;  Surgeon: Isadora Mar, MD;  Location: [facility] OR;  Service: Neurosurgery;  Laterality: Right;   COLON SURGERY  2008   colostomy-infection post op appendectomy   COLOSTOMY REVERSAL  2009   CRANIOTOMY Right 01/11/2022   Procedure: RIGHT CRANIOTOMY FOR TUMOR RESECTION WITH BRAIN LAB;  Surgeon: Isadora Mar, MD;  Location: [facility] OR;  Service: Neurosurgery;  Laterality: Right;   INTERCOSTAL NERVE BLOCK Right 10/19/2020   Procedure: INTERCOSTAL NERVE BLOCK;  Surgeon: Zelphia Higashi, MD;  Location: [facility] OR;  Service: Thoracic;  Laterality: Right;   NODE DISSECTION Right 10/19/2020   Procedure: NODE DISSECTION;  Surgeon: Zelphia Higashi, MD;  Location: [facility] OR;  Service: Thoracic;  Laterality: Right;   VIDEO BRONCHOSCOPY WITH ENDOBRONCHIAL NAVIGATION N/A 05/08/2020   Procedure: VIDEO BRONCHOSCOPY WITH ENDOBRONCHIAL NAVIGATION;  Surgeon: Zelphia Higashi, MD;  Location: [facility] OR;  Service: Thoracic;  Laterality: N/A;   VIDEO BRONCHOSCOPY WITH ENDOBRONCHIAL ULTRASOUND N/A 05/08/2020   Procedure: VIDEO BRONCHOSCOPY WITH ENDOBRONCHIAL ULTRASOUND;  Surgeon: Zelphia Higashi, MD;  Location: [facility] OR;  Service: Thoracic;  Laterality: N/A;    REVIEW OF SYSTEMS:  Constitutional: negative Eyes: negative Ears, nose, mouth, throat, and face: negative Respiratory:  negative Cardiovascular: negative Gastrointestinal: negative Genitourinary:negative Integument/breast: negative Hematologic/lymphatic: negative Musculoskeletal:negative Neurological: negative Behavioral/Psych: positive for anxiety Endocrine: negative Allergic/Immunologic: negative   PHYSICAL EXAMINATION: General appearance: alert, cooperative, fatigued, and no distress Head: Normocephalic, without obvious abnormality, atraumatic Neck: no adenopathy, no JVD, supple, symmetrical, trachea midline, and thyroid  not enlarged, symmetric, no tenderness/mass/nodules Lymph nodes: Cervical, supraclavicular, and axillary nodes normal. Resp: clear to auscultation bilaterally Back: symmetric, no curvature. ROM normal. No CVA tenderness. Cardio: regular rate and rhythm, S1, S2 normal, no murmur, click, rub or gallop GI: soft, non-tender; bowel sounds normal; no masses,  no organomegaly Extremities: extremities normal, atraumatic, no cyanosis or edema Neurologic: Alert and oriented X 3, normal strength and tone. Normal symmetric reflexes. Normal coordination and gait  ECOG PERFORMANCE STATUS: 1 - Symptomatic but completely ambulatory  Blood pressure 114/62, pulse 72, temperature 98.2 F (36.8 C), temperature source Oral, resp. rate 18, weight 119 lb 0.6 oz (54 kg), SpO2 96%.  LABORATORY DATA: Lab Results  Component Value Date   WBC 6.5 01/06/2024   HGB 12.4 01/06/2024   HCT 37.2 01/06/2024   MCV 94.4 01/06/2024   PLT 236 01/06/2024      Chemistry      Component Value Date/Time   NA 141 01/06/2024 1042  NA 135 10/29/2017 1131   K 3.9 01/06/2024 1042   CL 106 01/06/2024 1042   CO2 30 01/06/2024 1042   BUN 8 01/06/2024 1042   BUN 5 (L) 10/29/2017 1131   CREATININE 0.55 01/06/2024 1042      Component Value Date/Time   CALCIUM  9.4 01/06/2024 1042   ALKPHOS 60 01/06/2024 1042   AST 19 01/06/2024 1042   ALT 11 01/06/2024 1042   BILITOT 0.3 01/06/2024 1042       RADIOGRAPHIC  STUDIES: CT Chest W Contrast Result Date: 01/07/2024 CLINICAL DATA:  Staging non-small-cell lung cancer. Right upper lobe. * Tracking Code: BO * EXAM: CT CHEST, ABDOMEN, AND PELVIS WITH CONTRAST TECHNIQUE: Multidetector CT imaging of the chest, abdomen and pelvis was performed following the standard protocol during bolus administration of intravenous contrast. RADIATION DOSE REDUCTION: This exam was performed according to the departmental dose-optimization program which includes automated exposure control, adjustment of the mA and/or kV according to patient size and/or use of iterative reconstruction technique. CONTRAST:  OMNIPAQUE  IOHEXOL  300 MG/ML  SOLN COMPARISON:  PET-CT 09/11/2023. Chest CT 08/22/2023. Older CT examinations as well. FINDINGS: CT CHEST FINDINGS Cardiovascular: The thoracic aorta is normal course and caliber with slight partially calcified atherosclerotic plaque. There is some calcified plaque along the great vessels as well. Heart is nonenlarged. No pericardial effusion. Coronary artery calcifications are seen. Mediastinum/Nodes: Slightly patulous thoracic esophagus. Small thyroid  gland. No specific abnormal lymph node enlargement identified in the axillary regions, hilum or mediastinum. Lungs/Pleura: Stable volume loss along the right hemithorax with some scarring and surgical changes from lobectomy. There is some debris along right lower lobe bronchi. No consolidation, pneumothorax or effusion on the right. No new dominant lung nodule. Left lung is without consolidation, pneumothorax or effusion. In the superior segment of the left lower lobe is a spiculated nodule again seen measuring 8 mm on series 4, image 50. This lesion was not seen on the prior CT scan of December 2024. This lesion was also not present on the PET-CT scan of December 2024. The superior segment lesion medially in the left upper lobe which previously was hypermetabolic and had dimension of 10 mm, today is smaller on  series 4 image 48 measuring 6 mm. Stable 2 mm lingular nodule inferiorly on image 103. Stable 3 mm lingular nodule on image 81. Musculoskeletal: Mild degenerative changes along the spine. CT ABDOMEN PELVIS FINDINGS Hepatobiliary: Multiple stones in the gallbladder. Mild fatty liver infiltration. Tiny low-attenuation segment 6 check cystic focus, too small to completely characterize but likely a benign cystic lesion. Patent portal vein. Pancreas: Unremarkable. No pancreatic ductal dilatation or surrounding inflammatory changes. Spleen: Normal in size without focal abnormality. Adrenals/Urinary Tract: Adrenal glands are preserved. No enhancing renal mass or collecting system dilatation. Small parapelvic and parenchymal renal cystic foci. The ureters have a normal course and caliber down to the normal caliber urinary bladder. Stomach/Bowel: Oral contrast was administered. Large bowel has a normal course and caliber. Left-sided colonic diverticula. Surgical changes in the area of the cecum. The appendix is not seen. Stomach and small bowel are nondilated. Vascular/Lymphatic: Aortic atherosclerosis. No enlarged abdominal or pelvic lymph nodes. Reproductive: Uterus and bilateral adnexa are unremarkable. Other: No free air or free fluid. Small midline anterior abdominal wall fat containing hernia above umbilicus. Musculoskeletal: Curvature and degenerative changes along the spine. Old right eleventh rib fracture. Heterogeneous appearance of the femoral heads. Please correlate for AVN. This appears more pronounced than the CT scan of February 2024.  IMPRESSION: Superior segment left lower lobe lung nodule seen on the prior examination which was hypermetabolic appears smaller today. However there is a new nodule just lateral to this in the superior segment left lower lobe. No additional new dominant lung nodules. Few tiny nodules elsewhere. No developing lymph node enlargement. Surgical changes of prior right upper lobectomy.  Gallstones. Colonic diverticula.  Fatty liver infiltration. Heterogeneous appearance of the femoral heads bilaterally. Please correlate for history of AVN or other process. Electronically Signed   By: Adrianna Horde M.D.   On: 01/07/2024 13:09   CT ABDOMEN PELVIS W CONTRAST Result Date: 01/07/2024 CLINICAL DATA:  Staging non-small-cell lung cancer. Right upper lobe. * Tracking Code: BO * EXAM: CT CHEST, ABDOMEN, AND PELVIS WITH CONTRAST TECHNIQUE: Multidetector CT imaging of the chest, abdomen and pelvis was performed following the standard protocol during bolus administration of intravenous contrast. RADIATION DOSE REDUCTION: This exam was performed according to the departmental dose-optimization program which includes automated exposure control, adjustment of the mA and/or kV according to patient size and/or use of iterative reconstruction technique. CONTRAST:  OMNIPAQUE  IOHEXOL  300 MG/ML  SOLN COMPARISON:  PET-CT 09/11/2023. Chest CT 08/22/2023. Older CT examinations as well. FINDINGS: CT CHEST FINDINGS Cardiovascular: The thoracic aorta is normal course and caliber with slight partially calcified atherosclerotic plaque. There is some calcified plaque along the great vessels as well. Heart is nonenlarged. No pericardial effusion. Coronary artery calcifications are seen. Mediastinum/Nodes: Slightly patulous thoracic esophagus. Small thyroid  gland. No specific abnormal lymph node enlargement identified in the axillary regions, hilum or mediastinum. Lungs/Pleura: Stable volume loss along the right hemithorax with some scarring and surgical changes from lobectomy. There is some debris along right lower lobe bronchi. No consolidation, pneumothorax or effusion on the right. No new dominant lung nodule. Left lung is without consolidation, pneumothorax or effusion. In the superior segment of the left lower lobe is a spiculated nodule again seen measuring 8 mm on series 4, image 50. This lesion was not seen on the  prior CT scan of December 2024. This lesion was also not present on the PET-CT scan of December 2024. The superior segment lesion medially in the left upper lobe which previously was hypermetabolic and had dimension of 10 mm, today is smaller on series 4 image 48 measuring 6 mm. Stable 2 mm lingular nodule inferiorly on image 103. Stable 3 mm lingular nodule on image 81. Musculoskeletal: Mild degenerative changes along the spine. CT ABDOMEN PELVIS FINDINGS Hepatobiliary: Multiple stones in the gallbladder. Mild fatty liver infiltration. Tiny low-attenuation segment 6 check cystic focus, too small to completely characterize but likely a benign cystic lesion. Patent portal vein. Pancreas: Unremarkable. No pancreatic ductal dilatation or surrounding inflammatory changes. Spleen: Normal in size without focal abnormality. Adrenals/Urinary Tract: Adrenal glands are preserved. No enhancing renal mass or collecting system dilatation. Small parapelvic and parenchymal renal cystic foci. The ureters have a normal course and caliber down to the normal caliber urinary bladder. Stomach/Bowel: Oral contrast was administered. Large bowel has a normal course and caliber. Left-sided colonic diverticula. Surgical changes in the area of the cecum. The appendix is not seen. Stomach and small bowel are nondilated. Vascular/Lymphatic: Aortic atherosclerosis. No enlarged abdominal or pelvic lymph nodes. Reproductive: Uterus and bilateral adnexa are unremarkable. Other: No free air or free fluid. Small midline anterior abdominal wall fat containing hernia above umbilicus. Musculoskeletal: Curvature and degenerative changes along the spine. Old right eleventh rib fracture. Heterogeneous appearance of the femoral heads. Please correlate for AVN.  This appears more pronounced than the CT scan of February 2024. IMPRESSION: Superior segment left lower lobe lung nodule seen on the prior examination which was hypermetabolic appears smaller today.  However there is a new nodule just lateral to this in the superior segment left lower lobe. No additional new dominant lung nodules. Few tiny nodules elsewhere. No developing lymph node enlargement. Surgical changes of prior right upper lobectomy. Gallstones. Colonic diverticula.  Fatty liver infiltration. Heterogeneous appearance of the femoral heads bilaterally. Please correlate for history of AVN or other process. Electronically Signed   By: Adrianna Horde M.D.   On: 01/07/2024 13:09   MR Brain W Wo Contrast Result Date: 01/06/2024 CLINICAL DATA:  Brain metastases. Assess treatment response. Follow-up of untreated punctate lesion. EXAM: MRI HEAD WITHOUT AND WITH CONTRAST TECHNIQUE: Multiplanar, multiecho pulse sequences of the brain and surrounding structures were obtained without and with intravenous contrast. CONTRAST:  5.5 cc Vueway  COMPARISON:  10/08/2023.  08/06/2023. FINDINGS: Brain: I think the examination remains stable. Diffusion imaging remains normal. No abnormality affects the brainstem. 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 Cerebral hemispheres show mild chronic small-vessel ischemic change of the white matter which is unchanged. Previous right occipital craniotomy and tumor resection. Volume loss and gliosis in that region. Minimal irregular/punctate enhancement in the region of resection is stable over time with no evidence of increasing enhancement, mass effect or increasing T2/FLAIR signal. Therefore, I think the study is negative for evidence of residual or recurrent disease at this site. No other focus of suspected tumor is noted. Mild enhancement associated with a left frontal venous angioma as seen previously. Vascular: Major vessels at the base of the brain show flow. Skull and upper cervical spine: Negative Sinuses/Orbits: Clear/normal Other: None IMPRESSION: 1. Stable examination. Previous right occipital craniotomy and tumor resection. Volume loss and gliosis in  that region. Minimal irregular/punctate enhancement in the region of resection is stable over time with no evidence of increasing enhancement, mass effect or increasing T2/FLAIR signal. Therefore, I think the study is negative for evidence of residual or recurrent disease at this site. No other focus of suspected tumor is noted. 2. 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 Mild chronic small-vessel ischemic change of the cerebral hemispheric white matter, stable. Electronically Signed   By: Bettylou Brunner M.D.   On: 01/06/2024 15:01    ASSESSMENT AND PLAN: This is a very pleasant 73 years old white female with metastatic non-small cell lung cancer, adenocarcinoma that was initially diagnosed as stage IIIa (T1b, N2, M0) non-small cell lung cancer, adenocarcinoma presented with right upper lobe lung nodule in addition to mediastinal lymphadenopathy.  PD-L1 expression 90%.  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   She tolerated this treatment well with no concerning adverse effect except for dry skin and itching. Her repeat CT scan after the neoadjuvant treatment showed partial response with around 50% reduction in the tumor volume. The patient underwent right upper lobectomy with lymph node dissection on October 19, 2020 and the final pathology showed residual tumor measuring 0.5 cm with lymph node metastasis involving hilar and mediastinal lymph nodes. She underwent adjuvant systemic chemotherapy with carboplatin  for AUC of 5, Alimta  500 mg/M2 every 3 weeks since she has good response to this treatment in  the past.  She is status post 4 cycles.  She tolerated this treatment well with no concerning adverse effect except for mild fatigue. She underwent adjuvant immunotherapy with  Tecentriq  1200 Mg IV every 3 weeks status post 12 cycles.  Last dose was given on November 12, 2021 discontinued after the patient develop metastatic brain lesions and she has been off treatment since her diagnosis with the brain metastasis.. The patient underwent craniotomy with resection of the solitary brain metastasis followed by SRS to the surgical cavity. The patient has been on observation since that time. Her follow-up CT scan showed no new finding for disease progression except for 1.0 cm spiculated lung nodule within the medial aspect of the superior segment of the left lower lobe suspicious for disease recurrence. She had a PET scan that showed hypermetabolic in the superior segment left lower lobe pulmonary nodule suspicious for new malignancy.  She was treated with SBRT to the left lower lobe pulmonary nodule under the care of Dr. Lorri Rota completed on October 31 2023.  She is currently on observation. She had repeat CT scan of the chest, abdomen and pelvis performed recently.  I personally and independently reviewed the scan and discussed the results with the patient today.    Metastatic non-small cell lung cancer Metastatic non-small cell lung cancer, initially diagnosed as stage IIIA adenocarcinoma in September 2021. Solitary brain metastasis treated with craniotomy and SRS in April 2023. Recent SPRT to left lower lobe lung nodule in February 2025. Current CT scan shows reduction in size of treated nodule, but a new tiny nodule inferior to the treated area is noted. Differential includes radiation-induced changes versus new metastatic disease. No new symptoms such as chest pain, dyspnea, nausea, vomiting, diarrhea, constipation, headaches, or vision changes. - Order repeat CT scan of chest, abdomen, and pelvis in 3 months to monitor new nodule - Provide copy of current scans to her - Advise her to report any new symptoms or changes   The patient was advised to call immediately if she has  any other concerning symptoms in the interval. The patient voices understanding of current disease status and treatment options and is in agreement with the current care plan.  All questions were answered. The patient knows to call the clinic with any problems, questions or concerns. We can certainly see the patient much sooner if necessary. The total time spent in the appointment was 30 minutes.   Disclaimer: This note was dictated with voice recognition software. Similar sounding words can inadvertently be transcribed and may not be corrected upon review.

## 2024-04-01 NOTE — Progress Notes (Signed)
 Telephone nursing appointment for review of most recent MRI-Brain results. I verified patient's identity x2 and began nursing interview.   Patient states issues as follows...   -Fatigue: Denies -Hair Loss: Denies -Skin: Denies -Weakness: Denies -Loss of control of extremities: Denies -Headache: Denies -Seizure/ uncontrolled movement: Denies -Vision: Denies -Speech: Denies -Confusion: Denies -Dexamethasone / steroids: Denies   Patient denies any other related issues at this time.   Meaningful use complete.   Patient aware of their telephone appointment w/ Ashlyn Bruning PA-C. I left my extension [phone] in case patient needs anything. Patient verbalized understanding. This concludes the nursing interview.   Patient preferred phone # [phone]   Christina Minerva, LPN

## 2024-04-06 ENCOUNTER — Ambulatory Visit
Admission: RE | Admit: 2024-04-06 | Discharge: 2024-04-06 | Disposition: A | Discharge status: Home / Self Care | Source: Ambulatory Visit | Attending: Radiation Oncology | Admitting: Radiation Oncology

## 2024-04-06 DIAGNOSIS — C7931 Secondary malignant neoplasm of brain: Secondary | ICD-10-CM

## 2024-04-06 MED ORDER — GADOPICLENOL 0.5 MMOL/ML IV SOLN
5.0000 mL | Freq: Once | INTRAVENOUS | Status: AC | PRN
  Administered 2024-04-06: 5 mL via INTRAVENOUS

## 2024-04-07 ENCOUNTER — Ambulatory Visit: Payer: Self-pay | Admitting: Radiation Oncology

## 2024-04-12 ENCOUNTER — Inpatient Hospital Stay: Attending: Internal Medicine

## 2024-04-12 ENCOUNTER — Ambulatory Visit (HOSPITAL_COMMUNITY)
Admission: RE | Admit: 2024-04-12 | Discharge: 2024-04-12 | Disposition: A | Discharge status: Home / Self Care | Source: Ambulatory Visit | Attending: Internal Medicine | Admitting: Internal Medicine

## 2024-04-12 ENCOUNTER — Encounter

## 2024-04-12 DIAGNOSIS — C3490 Malignant neoplasm of unspecified part of unspecified bronchus or lung: Secondary | ICD-10-CM | POA: Insufficient documentation

## 2024-04-12 DIAGNOSIS — Z85118 Personal history of other malignant neoplasm of bronchus and lung: Secondary | ICD-10-CM | POA: Diagnosis present

## 2024-04-12 DIAGNOSIS — C3411 Malignant neoplasm of upper lobe, right bronchus or lung: Secondary | ICD-10-CM | POA: Insufficient documentation

## 2024-04-12 DIAGNOSIS — Z9221 Personal history of antineoplastic chemotherapy: Secondary | ICD-10-CM | POA: Insufficient documentation

## 2024-04-12 LAB — CMP (CANCER CENTER ONLY)
ALT: 11 U/L (ref 0–44)
AST: 19 U/L (ref 15–41)
Albumin: 4.2 g/dL (ref 3.5–5.0)
Alkaline Phosphatase: 63 U/L (ref 38–126)
Anion gap: 6 (ref 5–15)
BUN: 10 mg/dL (ref 8–23)
CO2: 31 mmol/L (ref 22–32)
Calcium: 9.3 mg/dL (ref 8.9–10.3)
Chloride: 102 mmol/L (ref 98–111)
Creatinine: 0.67 mg/dL (ref 0.44–1.00)
GFR, Estimated: >60 mL/min (ref >60)
Glucose, Bld: 98 mg/dL (ref 70–99)
Potassium: 3.9 mmol/L (ref 3.5–5.1)
Sodium: 139 mmol/L (ref 135–145)
Total Bilirubin: 0.4 mg/dL (ref 0.0–1.2)
Total Protein: 7 g/dL (ref 6.5–8.1)

## 2024-04-12 LAB — CBC WITH DIFFERENTIAL (CANCER CENTER ONLY)
Abs Immature Granulocytes: 0.04 K/uL (ref 0.00–0.07)
Basophils Absolute: 0.1 K/uL (ref 0.0–0.1)
Basophils Relative: 1 %
Eosinophils Absolute: 0.1 K/uL (ref 0.0–0.5)
Eosinophils Relative: 1 %
HCT: 38.5 % (ref 36.0–46.0)
Hemoglobin: 12.8 g/dL (ref 12.0–15.0)
Immature Granulocytes: 1 %
Lymphocytes Relative: 20 %
Lymphs Abs: 1.5 K/uL (ref 0.7–4.0)
MCH: 30.7 pg (ref 26.0–34.0)
MCHC: 33.2 g/dL (ref 30.0–36.0)
MCV: 92.3 fL (ref 80.0–100.0)
Monocytes Absolute: 0.7 K/uL (ref 0.1–1.0)
Monocytes Relative: 9 %
Neutro Abs: 5.2 K/uL (ref 1.7–7.7)
Neutrophils Relative %: 68 %
Platelet Count: 243 K/uL (ref 150–400)
RBC: 4.17 MIL/uL (ref 3.87–5.11)
RDW: 12 % (ref 11.5–15.5)
WBC Count: 7.5 K/uL (ref 4.0–10.5)
nRBC: 0 % (ref 0.0–0.2)

## 2024-04-12 MED ORDER — IOHEXOL 300 MG/ML  SOLN
100.0000 mL | Freq: Once | INTRAMUSCULAR | Status: AC | PRN
  Administered 2024-04-12: 100 mL via INTRAVENOUS

## 2024-04-14 ENCOUNTER — Encounter: Payer: Self-pay | Admitting: Urology

## 2024-04-14 ENCOUNTER — Ambulatory Visit
Admission: RE | Admit: 2024-04-14 | Discharge: 2024-04-14 | Disposition: A | Discharge status: Home / Self Care | Source: Ambulatory Visit | Attending: Urology | Admitting: Urology

## 2024-04-14 ENCOUNTER — Other Ambulatory Visit: Payer: Self-pay | Admitting: Radiation Therapy

## 2024-04-14 VITALS — Ht 63.0 in | Wt 118.0 lb

## 2024-04-14 DIAGNOSIS — C7931 Secondary malignant neoplasm of brain: Secondary | ICD-10-CM

## 2024-04-14 NOTE — Progress Notes (Signed)
 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   Interval Since Last Radiation:  2 years, 3 months s/p SRS brain; 5 months s/p SBRT LUL lung  10/20/23 - 10/31/23//SBRT: The LUL lung nodule was treated to 60 Gy in 5 fractions of 12 Gy each  01/03/22, 01/07/22, 01/09/22: Fractionated Pre-OP SRS PTV1: The right occipital 35 mm target was treated 24 Gy in 3 fractions of 8 Gy each.    Narrative:  I spoke with the patient to conduct her routine scheduled 3 month follow up visit to review her recent MRI brain results via telephone to spare the patient unnecessary potential exposure in the healthcare setting during the current COVID-19 pandemic.  The patient was notified in advance and gave permission to proceed with this visit format.   She tolerated her pre-op radiation treatment well and subsequently underwent surgical resection under the care of Dr. Joshua on 01/11/2022 and has recovered well from her surgery. She also tolerated the recent SBRT treatments to the LLL lung nodule completed 10/31/23. She is off systemic therapy and being followed in observation only with Dr. Sherrod. Her most recent restaging CT C/A/P from 04/12/24 was without any definite evidence of disease recurrence or progression. The recently treated superior segment left lower lobe lung nodule appears stable and the new nodule just lateral to this in the superior segment left lower lobe as well as the tiny nodules in the lingula also appear stable.  There are no new findings suggestive of recurrent or progressive disease.  She is scheduled for a follow-up visit with Dr. Sherrod on 04/19/24 to  review these results and recommendations.  Overall, she is quite pleased with her progress to date.  Most recent brain MRI from 04/06/24 shows an unchanged appearance of the right occipital treatment site as compared with prior MRI from 01/05/24 and no new lesions or findings to suggest disease recurrence or progression. We reviewed these results by telephone today.  On review of systems, the patient states that she is doing well in general.  She specifically denies chest pain, shortness of breath, productive cough, hemoptysis, headaches, nausea, vomiting, dizziness/imbalance or any changes in her visual or auditory acuity. She does have some mild decrease in her short term memory but feels like this is consistent with her age, no acute changes.   ALLERGIES:  has no known allergies.  Meds: Current Outpatient Medications  Medication Sig Dispense Refill   acetaminophen  (TYLENOL ) 325 MG tablet Take 1-2 tablets (325-650 mg total) by mouth every 4 (four) hours as needed for mild pain.     albuterol  (PROVENTIL  HFA;VENTOLIN  HFA) 108 (90 Base) MCG/ACT inhaler Inhale 2 puffs into the lungs every 4 (four) hours as needed for wheezing or shortness of breath (cough, shortness of breath or wheezing.). 1 Inhaler 1   alendronate (FOSAMAX) 70 MG tablet Take 70 mg by mouth once a week.     Cholecalciferol  (VITAMIN D3 PO) Take 2,000 Units by mouth daily.     fluticasone  (FLONASE ) 50 MCG/ACT nasal spray Place 2 sprays into both nostrils daily. 16 g 12   Fluticasone -Salmeterol (ADVAIR) 250-50 MCG/DOSE AEPB  Inhale 1 puff into the lungs 2 (two) times daily.      levothyroxine  (SYNTHROID ) 75 MCG tablet TAKE 1 TABLET BY MOUTH EVERY DAY BEFORE BREAKFAST 30 tablet 2   Multiple Vitamin (MULTIVITAMIN) tablet Take 1 tablet by mouth daily.     No current facility-administered medications for this encounter.    Physical Findings:  height is 5' 3 (1.6 m) and weight is 118 lb (53.5 kg).  Pain Assessment Pain Score: 0-No  pain/10 Unable to assess due to telephone follow-up visit format.  Lab Findings: Lab Results  Component Value Date   WBC 7.5 04/12/2024   HGB 12.8 04/12/2024   HCT 38.5 04/12/2024   MCV 92.3 04/12/2024   PLT 243 04/12/2024     Radiographic Findings: CT Chest W Contrast Result Date: 04/13/2024 CLINICAL DATA:  Non-small cell lung cancer, staging. * Tracking Code: BO * EXAM: CT CHEST, ABDOMEN, AND PELVIS WITH CONTRAST TECHNIQUE: Multidetector CT imaging of the chest, abdomen and pelvis was performed following the standard protocol during bolus administration of intravenous contrast. RADIATION DOSE REDUCTION: This exam was performed according to the departmental dose-optimization program which includes automated exposure control, adjustment of the mA and/or kV according to patient size and/or use of iterative reconstruction technique. CONTRAST:  OMNIPAQUE  IOHEXOL  300 MG/ML  SOLN COMPARISON:  Multiple priors including CT January 06, 2024 FINDINGS: CT CHEST FINDINGS Cardiovascular: Aortic atherosclerosis. Calcifications of the aortic annulus. Normal size heart. No significant pericardial effusion/thickening. Mediastinum/Nodes: No suspicious thyroid  nodule. No pathologically enlarged mediastinal, hilar or axillary lymph nodes. The esophagus is grossly unremarkable. Lungs/Pleura: Surgical change of prior right upper lobectomy without new suspicious nodularity along the suture line. Pulmonary nodules in the superior segment of the left lower lobe measure 6 mm on image 59/7, unchanged and 7 mm on image 64/7, unchanged. Stable 2 mm pulmonary nodule in the lingula on image 111/7 and 3 mm nodule in the lingula on image 90/7. Biapical pleuroparenchymal scarring. Scattered atelectasis/scarring. Mild emphysema. No pleural effusion. No pneumothorax. Musculoskeletal: Stable sclerotic focus in the T8 vertebral body unchanged back to a January 01, 2022 and not hypermetabolic on prior PET-CT. Multilevel degenerative  changes spine. CT ABDOMEN PELVIS FINDINGS Hepatobiliary: No suspicious hepatic lesion. Cholelithiasis. No biliary ductal dilation. Pancreas: No pancreatic ductal dilation or evidence of acute inflammation. Spleen: No splenomegaly. Adrenals/Urinary Tract: Bilateral adrenal glands appear normal. No hydronephrosis. Kidneys demonstrate symmetric enhancement. Urinary bladder is unremarkable for degree of distension. Stomach/Bowel: Stomach is nondistended limiting evaluation. No pathologic dilation of small or large bowel. Colonic diverticulosis without findings of acute diverticulitis. Vascular/Lymphatic: Aortic atherosclerosis. Normal caliber abdominal aorta. Smooth IVC contours. No pathologically enlarged abdominal or pelvic lymph nodes. Reproductive: Uterus and bilateral adnexa are unremarkable. Other: Fat containing ventral hernia. No significant abdominopelvic free fluid. Musculoskeletal: No aggressive lytic or blastic lesion of bone. Multilevel degenerative changes spine. A they its color necrosis of the bilateral 4 moral head without collapse. IMPRESSION: 1. Surgical change of prior right upper lobectomy without evidence of local recurrence. 2. Stable left upper lobe pulmonary nodules measuring up to 7 mm, 1 of which was hypermetabolic on prior PET-CT the other of which was not present on prior PET-CT and was first seen on recent chest CT January 06, 2024. Suggest continued attention on follow-up imaging. 3. Stable tiny pulmonary nodules in the lingula. 4. No evidence of metastatic disease in the abdomen or pelvis. 5. Cholelithiasis. 6. Colonic diverticulosis without findings of acute diverticulitis. 7. Aortic Atherosclerosis (ICD10-I70.0) and Emphysema (ICD10-J43.9). Electronically  Signed   By: Reyes Holder M.D.   On: 04/13/2024 12:47   CT ABDOMEN PELVIS W CONTRAST Result Date: 04/13/2024 CLINICAL DATA:  Non-small cell lung cancer, staging. * Tracking Code: BO * EXAM: CT CHEST, ABDOMEN, AND PELVIS WITH  CONTRAST TECHNIQUE: Multidetector CT imaging of the chest, abdomen and pelvis was performed following the standard protocol during bolus administration of intravenous contrast. RADIATION DOSE REDUCTION: This exam was performed according to the departmental dose-optimization program which includes automated exposure control, adjustment of the mA and/or kV according to patient size and/or use of iterative reconstruction technique. CONTRAST:  OMNIPAQUE  IOHEXOL  300 MG/ML  SOLN COMPARISON:  Multiple priors including CT January 06, 2024 FINDINGS: CT CHEST FINDINGS Cardiovascular: Aortic atherosclerosis. Calcifications of the aortic annulus. Normal size heart. No significant pericardial effusion/thickening. Mediastinum/Nodes: No suspicious thyroid  nodule. No pathologically enlarged mediastinal, hilar or axillary lymph nodes. The esophagus is grossly unremarkable. Lungs/Pleura: Surgical change of prior right upper lobectomy without new suspicious nodularity along the suture line. Pulmonary nodules in the superior segment of the left lower lobe measure 6 mm on image 59/7, unchanged and 7 mm on image 64/7, unchanged. Stable 2 mm pulmonary nodule in the lingula on image 111/7 and 3 mm nodule in the lingula on image 90/7. Biapical pleuroparenchymal scarring. Scattered atelectasis/scarring. Mild emphysema. No pleural effusion. No pneumothorax. Musculoskeletal: Stable sclerotic focus in the T8 vertebral body unchanged back to a January 01, 2022 and not hypermetabolic on prior PET-CT. Multilevel degenerative changes spine. CT ABDOMEN PELVIS FINDINGS Hepatobiliary: No suspicious hepatic lesion. Cholelithiasis. No biliary ductal dilation. Pancreas: No pancreatic ductal dilation or evidence of acute inflammation. Spleen: No splenomegaly. Adrenals/Urinary Tract: Bilateral adrenal glands appear normal. No hydronephrosis. Kidneys demonstrate symmetric enhancement. Urinary bladder is unremarkable for degree of distension.  Stomach/Bowel: Stomach is nondistended limiting evaluation. No pathologic dilation of small or large bowel. Colonic diverticulosis without findings of acute diverticulitis. Vascular/Lymphatic: Aortic atherosclerosis. Normal caliber abdominal aorta. Smooth IVC contours. No pathologically enlarged abdominal or pelvic lymph nodes. Reproductive: Uterus and bilateral adnexa are unremarkable. Other: Fat containing ventral hernia. No significant abdominopelvic free fluid. Musculoskeletal: No aggressive lytic or blastic lesion of bone. Multilevel degenerative changes spine. A they its color necrosis of the bilateral 4 moral head without collapse. IMPRESSION: 1. Surgical change of prior right upper lobectomy without evidence of local recurrence. 2. Stable left upper lobe pulmonary nodules measuring up to 7 mm, 1 of which was hypermetabolic on prior PET-CT the other of which was not present on prior PET-CT and was first seen on recent chest CT January 06, 2024. Suggest continued attention on follow-up imaging. 3. Stable tiny pulmonary nodules in the lingula. 4. No evidence of metastatic disease in the abdomen or pelvis. 5. Cholelithiasis. 6. Colonic diverticulosis without findings of acute diverticulitis. 7. Aortic Atherosclerosis (ICD10-I70.0) and Emphysema (ICD10-J43.9). Electronically Signed   By: Reyes Holder M.D.   On: 04/13/2024 12:47   MR Brain W Wo Contrast Result Date: 04/06/2024 CLINICAL DATA:  Provided history: Metastasis to brain. Brain metastases, assess treatment response. Additional history provided: History of lung cancer. EXAM: MRI HEAD WITHOUT AND WITH CONTRAST TECHNIQUE: Multiplanar, multiecho pulse sequences of the brain and surrounding structures were obtained without and with intravenous contrast. CONTRAST:  5 mL Vueway  intravenous contrast. COMPARISON:  Prior brain MRI examinations 01/05/2024 and earlier. FINDINGS: Brain: Redemonstrated postoperative changes from prior right occipital lobe lesion  resection. Mild ill-defined and curvilinear enhancement at the resection site is unchanged. Chronic blood products and T2 FLAIR  hyperintense signal abnormality at treatment site, unchanged. Mild dural thickening deep to the cranioplasty, also unchanged. Mild multifocal T2 FLAIR hyperintense signal abnormality elsewhere within the cerebral white matter, nonspecific but compatible chronic small vessel ischemic disease. 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 There is no acute infarct. No extra-axial fluid collection. No midline shift. Vascular: Maintained flow voids within the proximal large arterial vessels. Skull and upper cervical spine: Right parietooccipital cranioplasty. Incompletely assessed cervical spondylosis. Grade 1 anterolisthesis at C2-C3 and C3-C4. No focal worrisome marrow lesion. Sinuses/Orbits: No mass or acute finding within the imaged orbits. Prior bilateral ocular lens replacement. Mild mucosal thickening within the left sphenoid sinus. IMPRESSION: 1. Unchanged appearance of the right occipital lobe treatment site as compared to the prior MRI of 01/05/2024. 2. No new intracranial metastasis. Electronically Signed   By: Rockey Childs D.O.   On: 04/06/2024 17:19    Impression/Plan: 1. 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  She has recovered well from the effects of her preop SRS treatment and remains without complaints. We reviewed the results of her recent follow-up MRI brain scan from 04/06/24 which shows an unchanged appearance of the right occipital treatment site as compared with prior MRI from 01/05/24 and no new lesions or findings to suggest disease recurrence or progression.   Her most recent restaging systemic imaging from 04/12/24 was also without any definite evidence of disease recurrence or progression. She  continues on observation only and is scheduled for a follow-up visit with Dr. Sherrod on 04/19/24 to review these results and recommendations.   We reviewed the results of her recent MRI brain scan today by telephone. This scan remains stable so we will move to 6 month surveillance MRI brain scans. She appears to have a good understanding of her disease and our recommendations and is comfortable and in agreement with the stated plan. She knows that she is welcome to call at anytime in the interim with any questions or concerns.  I personally spent 20 minutes in this encounter including chart review, reviewing radiological studies, telephone conversation with the patient, entering orders and completing documentation.   Sabra MICAEL Rusk, MMS, PA-C [facility] Radiation Oncology Physician Assistant Direct Dial: [phone]  Fax: [phone]

## 2024-04-19 ENCOUNTER — Inpatient Hospital Stay: Attending: Internal Medicine | Admitting: Internal Medicine

## 2024-04-19 VITALS — BP 112/60 | HR 68 | Temp 97.8°F | Resp 16 | Ht 63.0 in | Wt 118.0 lb

## 2024-04-19 DIAGNOSIS — C3490 Malignant neoplasm of unspecified part of unspecified bronchus or lung: Secondary | ICD-10-CM | POA: Diagnosis not present

## 2024-04-19 DIAGNOSIS — Z9221 Personal history of antineoplastic chemotherapy: Secondary | ICD-10-CM | POA: Insufficient documentation

## 2024-04-19 DIAGNOSIS — C7931 Secondary malignant neoplasm of brain: Secondary | ICD-10-CM | POA: Insufficient documentation

## 2024-04-19 DIAGNOSIS — Z85118 Personal history of other malignant neoplasm of bronchus and lung: Secondary | ICD-10-CM | POA: Diagnosis not present

## 2024-04-19 NOTE — Progress Notes (Signed)
 [facility] Telephone:([phone]) [phone]   [phone]  OFFICE PROGRESS NOTE  Juliane Che, PA [address] [place] [address]  DIAGNOSIS: Metastatic non-small cell lung cancer initially diagnosed as stage IIIA (T1b, N2, M0) non-small cell lung cancer, adenocarcinoma presented with right upper lobe lung nodule as well as right upper paratracheal adenopathy and suspicious tiny nodule in the right middle lobe.  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  First dose 05/30/2020. Status post 3 cycles. 2) status post robotic assisted right thoracoscopy with right upper lobectomy and lymph node dissection under the care of Dr. Kerrin on October 19, 2020.  There was residual tumor measuring 0.5 cm with lymph node metastasis involving 4R, 12 and hilar lymph nodes. 3) Adjuvant systemic chemotherapy with carboplatin  for AUC of 5 and Alimta  500 Mg/M2 every 3 weeks.  First dose was given December 11, 2020.  Status post 4 cycles.  Last dose of chemotherapy was given Feb 13, 2021. 4) status post right posterior parietal craniotomy with resection of the brain mass under the care of Dr. Alm Molt followed by [facility] to the solitary brain metastasis under the care of Dr. Patrcia.  The final pathology was consistent with metastatic adenocarcinoma of lung primary. 5) Adjuvant immunotherapy with Atezolizumab  1200 Mg IV every 3 weeks.  First dose March 27, 2021.  Status post 11 cycles.  Last cycle was given on November 12, 2021 this treatment was discontinued secondary to disease progression in the brain. 6) SBRT to treat the left lung nodule under the care of Dr. Patrcia completed October 31, 2023.   CURRENT THERAPY: Observation  INTERVAL HISTORY: [name] 73 y.o.  female returns to the clinic today for 49-month follow-up visit. Discussed the use of AI scribe software for clinical note transcription with the patient, who gave verbal consent to proceed.  History of Present Illness [name] is a 73 year old female with metastatic non-small cell lung cancer who presents for evaluation with repeat CT scan for restaging of her disease.  She has a history of metastatic non-small cell lung cancer, adenocarcinoma, initially diagnosed as stage 3A in April 2023 with PD-L1 expression of 90%. She underwent neoadjuvant treatment for brain metastasis, including a right posterior parietal craniotomy for resection of the brain tumor followed by stereotactic radiosurgery [facility]).  She received adjuvant treatment with atezolizumab  every three weeks for eleven cycles, which was discontinued upon the development of brain metastasis. In February 2025, she underwent stereotactic body radiation therapy (SBRT) to a lung nodule.  No new symptoms since her last visit three months ago. No chest pain, breathing issues, hemoptysis, nausea, vomiting, diarrhea, headaches, or recent weight loss. Her weight remains stable at 118 pounds.    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  MEDICATIONS:  Current Outpatient Medications  Medication Sig Dispense Refill   acetaminophen  (TYLENOL ) 325 MG tablet Take 1-2 tablets (325-650 mg total) by mouth every 4 (four) hours as needed for mild pain.     albuterol  (PROVENTIL  HFA;VENTOLIN  HFA) 108 (90 Base) MCG/ACT inhaler Inhale 2 puffs  into the lungs every 4 (four) hours as needed for wheezing or shortness of breath (cough, shortness of breath or wheezing.). 1 Inhaler 1   alendronate (FOSAMAX) 70 MG tablet Take 70 mg by mouth once a week.     Cholecalciferol  (VITAMIN D3 PO) Take 2,000 Units  by mouth daily.     fluticasone  (FLONASE ) 50 MCG/ACT nasal spray Place 2 sprays into both nostrils daily. 16 g 12   Fluticasone -Salmeterol (ADVAIR) 250-50 MCG/DOSE AEPB Inhale 1 puff into the lungs 2 (two) times daily.      levothyroxine  (SYNTHROID ) 75 MCG tablet TAKE 1 TABLET BY MOUTH EVERY DAY BEFORE BREAKFAST 30 tablet 2   Multiple Vitamin (MULTIVITAMIN) tablet Take 1 tablet by mouth daily.     No current facility-administered medications for this visit.    SURGICAL HISTORY:  Past Surgical History:  Procedure Laterality Date   APPENDECTOMY  2008   APPLICATION OF CRANIAL NAVIGATION Right 01/11/2022   Procedure: APPLICATION OF CRANIAL NAVIGATION;  Surgeon: Joshua Alm RAMAN, MD;  Location: [facility] OR;  Service: Neurosurgery;  Laterality: Right;   COLON SURGERY  2008   colostomy-infection post op appendectomy   COLOSTOMY REVERSAL  2009   CRANIOTOMY Right 01/11/2022   Procedure: RIGHT CRANIOTOMY FOR TUMOR RESECTION WITH BRAIN LAB;  Surgeon: Joshua Alm RAMAN, MD;  Location: [facility] OR;  Service: Neurosurgery;  Laterality: Right;   INTERCOSTAL NERVE BLOCK Right 10/19/2020   Procedure: INTERCOSTAL NERVE BLOCK;  Surgeon: Kerrin Elspeth BROCKS, MD;  Location: [facility] OR;  Service: Thoracic;  Laterality: Right;   NODE DISSECTION Right 10/19/2020   Procedure: NODE DISSECTION;  Surgeon: Kerrin Elspeth BROCKS, MD;  Location: [facility] OR;  Service: Thoracic;  Laterality: Right;   VIDEO BRONCHOSCOPY WITH ENDOBRONCHIAL NAVIGATION N/A 05/08/2020   Procedure: VIDEO BRONCHOSCOPY WITH ENDOBRONCHIAL NAVIGATION;  Surgeon: Kerrin Elspeth BROCKS, MD;  Location: [facility] OR;  Service: Thoracic;  Laterality: N/A;   VIDEO BRONCHOSCOPY WITH ENDOBRONCHIAL ULTRASOUND N/A 05/08/2020   Procedure: VIDEO BRONCHOSCOPY WITH ENDOBRONCHIAL ULTRASOUND;  Surgeon: Kerrin Elspeth BROCKS, MD;  Location: [facility] OR;  Service: Thoracic;  Laterality: N/A;    REVIEW OF SYSTEMS:  Constitutional: negative Eyes: negative Ears, nose, mouth, throat, and face:  negative Respiratory: negative Cardiovascular: negative Gastrointestinal: negative Genitourinary:negative Integument/breast: negative Hematologic/lymphatic: negative Musculoskeletal:negative Neurological: negative Behavioral/Psych: negative Endocrine: negative Allergic/Immunologic: negative   PHYSICAL EXAMINATION: General appearance: alert, cooperative, and no distress Head: Normocephalic, without obvious abnormality, atraumatic Neck: no adenopathy, no JVD, supple, symmetrical, trachea midline, and thyroid  not enlarged, symmetric, no tenderness/mass/nodules Lymph nodes: Cervical, supraclavicular, and axillary nodes normal. Resp: clear to auscultation bilaterally Back: symmetric, no curvature. ROM normal. No CVA tenderness. Cardio: regular rate and rhythm, S1, S2 normal, no murmur, click, rub or gallop GI: soft, non-tender; bowel sounds normal; no masses,  no organomegaly Extremities: extremities normal, atraumatic, no cyanosis or edema Neurologic: Alert and oriented X 3, normal strength and tone. Normal symmetric reflexes. Normal coordination and gait  ECOG PERFORMANCE STATUS: 1 - Symptomatic but completely ambulatory  Blood pressure 112/60, pulse 68, temperature 97.8 F (36.6 C), temperature source Temporal, resp. rate 16, height 5' 3 (1.6 m), weight 118 lb (53.5 kg), SpO2 100%.  LABORATORY DATA: Lab Results  Component Value Date   WBC 7.5 04/12/2024   HGB 12.8 04/12/2024   HCT 38.5 04/12/2024   MCV 92.3 04/12/2024   PLT 243 04/12/2024      Chemistry      Component Value Date/Time   NA 139 04/12/2024 0905   NA 135 10/29/2017 1131  K 3.9 04/12/2024 0905   CL 102 04/12/2024 0905   CO2 31 04/12/2024 0905   BUN 10 04/12/2024 0905   BUN 5 (L) 10/29/2017 1131   CREATININE 0.67 04/12/2024 0905      Component Value Date/Time   CALCIUM  9.3 04/12/2024 0905   ALKPHOS 63 04/12/2024 0905   AST 19 04/12/2024 0905   ALT 11 04/12/2024 0905   BILITOT 0.4 04/12/2024 0905        RADIOGRAPHIC STUDIES: CT Chest W Contrast Result Date: 04/13/2024 CLINICAL DATA:  Non-small cell lung cancer, staging. * Tracking Code: BO * EXAM: CT CHEST, ABDOMEN, AND PELVIS WITH CONTRAST TECHNIQUE: Multidetector CT imaging of the chest, abdomen and pelvis was performed following the standard protocol during bolus administration of intravenous contrast. RADIATION DOSE REDUCTION: This exam was performed according to the departmental dose-optimization program which includes automated exposure control, adjustment of the mA and/or kV according to patient size and/or use of iterative reconstruction technique. CONTRAST:  OMNIPAQUE  IOHEXOL  300 MG/ML  SOLN COMPARISON:  Multiple priors including CT January 06, 2024 FINDINGS: CT CHEST FINDINGS Cardiovascular: Aortic atherosclerosis. Calcifications of the aortic annulus. Normal size heart. No significant pericardial effusion/thickening. Mediastinum/Nodes: No suspicious thyroid  nodule. No pathologically enlarged mediastinal, hilar or axillary lymph nodes. The esophagus is grossly unremarkable. Lungs/Pleura: Surgical change of prior right upper lobectomy without new suspicious nodularity along the suture line. Pulmonary nodules in the superior segment of the left lower lobe measure 6 mm on image 59/7, unchanged and 7 mm on image 64/7, unchanged. Stable 2 mm pulmonary nodule in the lingula on image 111/7 and 3 mm nodule in the lingula on image 90/7. Biapical pleuroparenchymal scarring. Scattered atelectasis/scarring. Mild emphysema. No pleural effusion. No pneumothorax. Musculoskeletal: Stable sclerotic focus in the T8 vertebral body unchanged back to a January 01, 2022 and not hypermetabolic on prior PET-CT. Multilevel degenerative changes spine. CT ABDOMEN PELVIS FINDINGS Hepatobiliary: No suspicious hepatic lesion. Cholelithiasis. No biliary ductal dilation. Pancreas: No pancreatic ductal dilation or evidence of acute inflammation. Spleen: No splenomegaly.  Adrenals/Urinary Tract: Bilateral adrenal glands appear normal. No hydronephrosis. Kidneys demonstrate symmetric enhancement. Urinary bladder is unremarkable for degree of distension. Stomach/Bowel: Stomach is nondistended limiting evaluation. No pathologic dilation of small or large bowel. Colonic diverticulosis without findings of acute diverticulitis. Vascular/Lymphatic: Aortic atherosclerosis. Normal caliber abdominal aorta. Smooth IVC contours. No pathologically enlarged abdominal or pelvic lymph nodes. Reproductive: Uterus and bilateral adnexa are unremarkable. Other: Fat containing ventral hernia. No significant abdominopelvic free fluid. Musculoskeletal: No aggressive lytic or blastic lesion of bone. Multilevel degenerative changes spine. A they its color necrosis of the bilateral 4 moral head without collapse. IMPRESSION: 1. Surgical change of prior right upper lobectomy without evidence of local recurrence. 2. Stable left upper lobe pulmonary nodules measuring up to 7 mm, 1 of which was hypermetabolic on prior PET-CT the other of which was not present on prior PET-CT and was first seen on recent chest CT January 06, 2024. Suggest continued attention on follow-up imaging. 3. Stable tiny pulmonary nodules in the lingula. 4. No evidence of metastatic disease in the abdomen or pelvis. 5. Cholelithiasis. 6. Colonic diverticulosis without findings of acute diverticulitis. 7. Aortic Atherosclerosis (ICD10-I70.0) and Emphysema (ICD10-J43.9). Electronically Signed   By: Reyes Holder M.D.   On: 04/13/2024 12:47   CT ABDOMEN PELVIS W CONTRAST Result Date: 04/13/2024 CLINICAL DATA:  Non-small cell lung cancer, staging. * Tracking Code: BO * EXAM: CT CHEST, ABDOMEN, AND PELVIS WITH CONTRAST TECHNIQUE: Multidetector CT imaging of  the chest, abdomen and pelvis was performed following the standard protocol during bolus administration of intravenous contrast. RADIATION DOSE REDUCTION: This exam was performed according  to the departmental dose-optimization program which includes automated exposure control, adjustment of the mA and/or kV according to patient size and/or use of iterative reconstruction technique. CONTRAST:  OMNIPAQUE  IOHEXOL  300 MG/ML  SOLN COMPARISON:  Multiple priors including CT January 06, 2024 FINDINGS: CT CHEST FINDINGS Cardiovascular: Aortic atherosclerosis. Calcifications of the aortic annulus. Normal size heart. No significant pericardial effusion/thickening. Mediastinum/Nodes: No suspicious thyroid  nodule. No pathologically enlarged mediastinal, hilar or axillary lymph nodes. The esophagus is grossly unremarkable. Lungs/Pleura: Surgical change of prior right upper lobectomy without new suspicious nodularity along the suture line. Pulmonary nodules in the superior segment of the left lower lobe measure 6 mm on image 59/7, unchanged and 7 mm on image 64/7, unchanged. Stable 2 mm pulmonary nodule in the lingula on image 111/7 and 3 mm nodule in the lingula on image 90/7. Biapical pleuroparenchymal scarring. Scattered atelectasis/scarring. Mild emphysema. No pleural effusion. No pneumothorax. Musculoskeletal: Stable sclerotic focus in the T8 vertebral body unchanged back to a January 01, 2022 and not hypermetabolic on prior PET-CT. Multilevel degenerative changes spine. CT ABDOMEN PELVIS FINDINGS Hepatobiliary: No suspicious hepatic lesion. Cholelithiasis. No biliary ductal dilation. Pancreas: No pancreatic ductal dilation or evidence of acute inflammation. Spleen: No splenomegaly. Adrenals/Urinary Tract: Bilateral adrenal glands appear normal. No hydronephrosis. Kidneys demonstrate symmetric enhancement. Urinary bladder is unremarkable for degree of distension. Stomach/Bowel: Stomach is nondistended limiting evaluation. No pathologic dilation of small or large bowel. Colonic diverticulosis without findings of acute diverticulitis. Vascular/Lymphatic: Aortic atherosclerosis. Normal caliber abdominal aorta.  Smooth IVC contours. No pathologically enlarged abdominal or pelvic lymph nodes. Reproductive: Uterus and bilateral adnexa are unremarkable. Other: Fat containing ventral hernia. No significant abdominopelvic free fluid. Musculoskeletal: No aggressive lytic or blastic lesion of bone. Multilevel degenerative changes spine. A they its color necrosis of the bilateral 4 moral head without collapse. IMPRESSION: 1. Surgical change of prior right upper lobectomy without evidence of local recurrence. 2. Stable left upper lobe pulmonary nodules measuring up to 7 mm, 1 of which was hypermetabolic on prior PET-CT the other of which was not present on prior PET-CT and was first seen on recent chest CT January 06, 2024. Suggest continued attention on follow-up imaging. 3. Stable tiny pulmonary nodules in the lingula. 4. No evidence of metastatic disease in the abdomen or pelvis. 5. Cholelithiasis. 6. Colonic diverticulosis without findings of acute diverticulitis. 7. Aortic Atherosclerosis (ICD10-I70.0) and Emphysema (ICD10-J43.9). Electronically Signed   By: Reyes Holder M.D.   On: 04/13/2024 12:47   MR Brain W Wo Contrast Result Date: 04/06/2024 CLINICAL DATA:  Provided history: Metastasis to brain. Brain metastases, assess treatment response. Additional history provided: History of lung cancer. EXAM: MRI HEAD WITHOUT AND WITH CONTRAST TECHNIQUE: Multiplanar, multiecho pulse sequences of the brain and surrounding structures were obtained without and with intravenous contrast. CONTRAST:  5 mL Vueway  intravenous contrast. COMPARISON:  Prior brain MRI examinations 01/05/2024 and earlier. FINDINGS: Brain: Redemonstrated postoperative changes from prior right occipital lobe lesion resection. Mild ill-defined and curvilinear enhancement at the resection site is unchanged. Chronic blood products and T2 FLAIR hyperintense signal abnormality at treatment site, unchanged. Mild dural thickening deep to the cranioplasty, also  unchanged. Mild multifocal T2 FLAIR hyperintense signal abnormality elsewhere within the cerebral white matter, nonspecific but compatible chronic small vessel ischemic disease. 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 There is no  acute infarct. No extra-axial fluid collection. No midline shift. Vascular: Maintained flow voids within the proximal large arterial vessels. Skull and upper cervical spine: Right parietooccipital cranioplasty. Incompletely assessed cervical spondylosis. Grade 1 anterolisthesis at C2-C3 and C3-C4. No focal worrisome marrow lesion. Sinuses/Orbits: No mass or acute finding within the imaged orbits. Prior bilateral ocular lens replacement. Mild mucosal thickening within the left sphenoid sinus. IMPRESSION: 1. Unchanged appearance of the right occipital lobe treatment site as compared to the prior MRI of 01/05/2024. 2. No new intracranial metastasis. Electronically Signed   By: Rockey Childs D.O.   On: 04/06/2024 17:19    ASSESSMENT AND PLAN: This is a very pleasant 73 years old white female with metastatic non-small cell lung cancer, adenocarcinoma that was initially diagnosed as stage IIIa (T1b, N2, M0) non-small cell lung cancer, adenocarcinoma presented with right upper lobe lung nodule in addition to mediastinal lymphadenopathy.  PD-L1 expression 90%.  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   She tolerated this treatment well with no concerning adverse effect except for dry skin and itching. Her repeat CT scan after the neoadjuvant treatment showed partial response with around 50% reduction in the tumor volume. The patient underwent right upper lobectomy with lymph node dissection on October 19, 2020 and the final pathology showed residual  tumor measuring 0.5 cm with lymph node metastasis involving hilar and mediastinal lymph nodes. She underwent adjuvant systemic chemotherapy with carboplatin  for AUC of 5, Alimta  500 mg/M2 every 3 weeks since she has good response to this treatment in the past.  She is status post 4 cycles.  She tolerated this treatment well with no concerning adverse effect except for mild fatigue. She underwent adjuvant immunotherapy with Tecentriq  1200 Mg IV every 3 weeks status post 12 cycles.  Last dose was given on November 12, 2021 discontinued after the patient develop metastatic brain lesions and she has been off treatment since her diagnosis with the brain metastasis.. The patient underwent craniotomy with resection of the solitary brain metastasis followed by SRS to the surgical cavity. The patient has been on observation since that time. Her follow-up CT scan showed no new finding for disease progression except for 1.0 cm spiculated lung nodule within the medial aspect of the superior segment of the left lower lobe suspicious for disease recurrence. She had a PET scan that showed hypermetabolic in the superior segment left lower lobe pulmonary nodule suspicious for new malignancy.  She was treated with SBRT to the left lower lobe pulmonary nodule under the care of Dr. Patrcia completed on October 31 2023.  She is currently on observation. She had repeat CT scan of the chest, abdomen and pelvis performed recently.  I personally and independently reviewed the scan and discussed the result with the patient today.  Her scan showed no concerning findings for disease recurrence or metastasis. Assessment and Plan Assessment & Plan Metastatic lung adenocarcinoma with prior brain metastasis Initially diagnosed as stage 3A in April 2023 with PD-L1 expression of 90%. Underwent neoadjuvant treatment for brain metastasis, including right posterior parietal craniotomy and SRS. Received adjuvant treatment with atezolizumab   every three weeks for eleven cycles, discontinued upon development of brain metastasis. Underwent SPRT to left lung nodule in February 2025. Currently on observation. Recent CT scan of chest, abdomen, and pelvis  shows well-managed disease with no concerning findings. Lung nodules are well-controlled with no growth or spread. - Repeat CT scan of chest, abdomen, and pelvis in three months. - Repeat blood work in three months. She was advised to call immediately if she has any other concerning symptoms in the interval. The patient voices understanding of current disease status and treatment options and is in agreement with the current care plan.  All questions were answered. The patient knows to call the clinic with any problems, questions or concerns. We can certainly see the patient much sooner if necessary. The total time spent in the appointment was 30 minutes. Disclaimer: This note was dictated with voice recognition software. Similar sounding words can inadvertently be transcribed and may not be corrected upon review.

## 2024-04-21 ENCOUNTER — Telehealth: Payer: Self-pay | Admitting: Internal Medicine

## 2024-04-21 NOTE — Telephone Encounter (Signed)
 Left the patient a voicemail with the appointment details.

## 2024-07-11 ENCOUNTER — Emergency Department (HOSPITAL_BASED_OUTPATIENT_CLINIC_OR_DEPARTMENT_OTHER)

## 2024-07-11 ENCOUNTER — Encounter (HOSPITAL_BASED_OUTPATIENT_CLINIC_OR_DEPARTMENT_OTHER): Payer: Self-pay

## 2024-07-11 ENCOUNTER — Inpatient Hospital Stay (HOSPITAL_BASED_OUTPATIENT_CLINIC_OR_DEPARTMENT_OTHER)
Admission: EM | Admit: 2024-07-11 | Discharge: 2024-07-14 | DRG: 853 | Disposition: A | Discharge status: Home / Self Care | Attending: Internal Medicine | Admitting: Internal Medicine

## 2024-07-11 ENCOUNTER — Other Ambulatory Visit: Payer: Self-pay

## 2024-07-11 DIAGNOSIS — D649 Anemia, unspecified: Secondary | ICD-10-CM | POA: Diagnosis not present

## 2024-07-11 DIAGNOSIS — K315 Obstruction of duodenum: Secondary | ICD-10-CM | POA: Diagnosis not present

## 2024-07-11 DIAGNOSIS — E872 Acidosis, unspecified: Secondary | ICD-10-CM | POA: Diagnosis present

## 2024-07-11 DIAGNOSIS — Z87891 Personal history of nicotine dependence: Secondary | ICD-10-CM | POA: Diagnosis not present

## 2024-07-11 DIAGNOSIS — K828 Other specified diseases of gallbladder: Secondary | ICD-10-CM | POA: Diagnosis present

## 2024-07-11 DIAGNOSIS — A419 Sepsis, unspecified organism: Secondary | ICD-10-CM | POA: Diagnosis present

## 2024-07-11 DIAGNOSIS — E8809 Other disorders of plasma-protein metabolism, not elsewhere classified: Secondary | ICD-10-CM | POA: Diagnosis present

## 2024-07-11 DIAGNOSIS — Z7951 Long term (current) use of inhaled steroids: Secondary | ICD-10-CM

## 2024-07-11 DIAGNOSIS — Z85118 Personal history of other malignant neoplasm of bronchus and lung: Secondary | ICD-10-CM | POA: Diagnosis not present

## 2024-07-11 DIAGNOSIS — Z860100 Personal history of colon polyps, unspecified: Secondary | ICD-10-CM | POA: Diagnosis not present

## 2024-07-11 DIAGNOSIS — C7931 Secondary malignant neoplasm of brain: Secondary | ICD-10-CM

## 2024-07-11 DIAGNOSIS — Z9049 Acquired absence of other specified parts of digestive tract: Secondary | ICD-10-CM

## 2024-07-11 DIAGNOSIS — K819 Cholecystitis, unspecified: Secondary | ICD-10-CM | POA: Diagnosis not present

## 2024-07-11 DIAGNOSIS — K810 Acute cholecystitis: Principal | ICD-10-CM | POA: Diagnosis present

## 2024-07-11 DIAGNOSIS — E039 Hypothyroidism, unspecified: Secondary | ICD-10-CM | POA: Diagnosis present

## 2024-07-11 DIAGNOSIS — K5730 Diverticulosis of large intestine without perforation or abscess without bleeding: Secondary | ICD-10-CM | POA: Diagnosis present

## 2024-07-11 DIAGNOSIS — K831 Obstruction of bile duct: Secondary | ICD-10-CM | POA: Diagnosis present

## 2024-07-11 DIAGNOSIS — K8000 Calculus of gallbladder with acute cholecystitis without obstruction: Secondary | ICD-10-CM | POA: Diagnosis present

## 2024-07-11 DIAGNOSIS — K8042 Calculus of bile duct with acute cholecystitis without obstruction: Secondary | ICD-10-CM | POA: Diagnosis not present

## 2024-07-11 DIAGNOSIS — C3490 Malignant neoplasm of unspecified part of unspecified bronchus or lung: Secondary | ICD-10-CM | POA: Diagnosis present

## 2024-07-11 DIAGNOSIS — Z7983 Long term (current) use of bisphosphonates: Secondary | ICD-10-CM | POA: Diagnosis not present

## 2024-07-11 DIAGNOSIS — J449 Chronic obstructive pulmonary disease, unspecified: Secondary | ICD-10-CM | POA: Diagnosis present

## 2024-07-11 DIAGNOSIS — Z85841 Personal history of malignant neoplasm of brain: Secondary | ICD-10-CM | POA: Diagnosis not present

## 2024-07-11 DIAGNOSIS — K3189 Other diseases of stomach and duodenum: Secondary | ICD-10-CM | POA: Diagnosis not present

## 2024-07-11 DIAGNOSIS — Z79890 Hormone replacement therapy (postmenopausal): Secondary | ICD-10-CM

## 2024-07-11 DIAGNOSIS — K269 Duodenal ulcer, unspecified as acute or chronic, without hemorrhage or perforation: Secondary | ICD-10-CM | POA: Diagnosis present

## 2024-07-11 DIAGNOSIS — K8050 Calculus of bile duct without cholangitis or cholecystitis without obstruction: Secondary | ICD-10-CM | POA: Diagnosis not present

## 2024-07-11 DIAGNOSIS — K838 Other specified diseases of biliary tract: Secondary | ICD-10-CM | POA: Diagnosis not present

## 2024-07-11 LAB — HEPATIC FUNCTION PANEL
ALT: 241 U/L — ABNORMAL HIGH (ref 0–44)
AST: 377 U/L — ABNORMAL HIGH (ref 15–41)
Albumin: 4.2 g/dL (ref 3.5–5.0)
Alkaline Phosphatase: 169 U/L — ABNORMAL HIGH (ref 38–126)
Bilirubin, Direct: 2.7 mg/dL — ABNORMAL HIGH (ref 0.0–0.2)
Indirect Bilirubin: 1.6 mg/dL — ABNORMAL HIGH (ref 0.3–0.9)
Total Bilirubin: 4.4 mg/dL — ABNORMAL HIGH (ref 0.0–1.2)
Total Protein: 7.2 g/dL (ref 6.5–8.1)

## 2024-07-11 LAB — URINALYSIS, ROUTINE W REFLEX MICROSCOPIC
Glucose, UA: NEGATIVE mg/dL
Hgb urine dipstick: NEGATIVE
Ketones, ur: NEGATIVE mg/dL
Leukocytes,Ua: NEGATIVE
Nitrite: NEGATIVE
Specific Gravity, Urine: 1.02 (ref 1.005–1.030)
pH: 6 (ref 5.0–8.0)

## 2024-07-11 LAB — BASIC METABOLIC PANEL WITH GFR
Anion gap: 12 (ref 5–15)
BUN: 10 mg/dL (ref 8–23)
CO2: 26 mmol/L (ref 22–32)
Calcium: 9.8 mg/dL (ref 8.9–10.3)
Chloride: 99 mmol/L (ref 98–111)
Creatinine, Ser: 0.82 mg/dL (ref 0.44–1.00)
GFR, Estimated: >60 mL/min (ref >60)
Glucose, Bld: 110 mg/dL — ABNORMAL HIGH (ref 70–99)
Potassium: 3.7 mmol/L (ref 3.5–5.1)
Sodium: 137 mmol/L (ref 135–145)

## 2024-07-11 LAB — CBC
HCT: 37 % (ref 36.0–46.0)
Hemoglobin: 12.3 g/dL (ref 12.0–15.0)
MCH: 31.4 pg (ref 26.0–34.0)
MCHC: 33.2 g/dL (ref 30.0–36.0)
MCV: 94.4 fL (ref 80.0–100.0)
Platelets: 225 K/uL (ref 150–400)
RBC: 3.92 MIL/uL (ref 3.87–5.11)
RDW: 13.8 % (ref 11.5–15.5)
WBC: 14.2 K/uL — ABNORMAL HIGH (ref 4.0–10.5)
nRBC: 0 % (ref 0.0–0.2)

## 2024-07-11 LAB — LIPASE, BLOOD: Lipase: 27 U/L (ref 11–51)

## 2024-07-11 MED ORDER — FLUTICASONE PROPIONATE 50 MCG/ACT NA SUSP
2.0000 | Freq: Every day | NASAL | Status: DC
  Administered 2024-07-13 – 2024-07-14 (×2): 2 via NASAL
  Filled 2024-07-11: qty 16

## 2024-07-11 MED ORDER — MORPHINE SULFATE (PF) 4 MG/ML IV SOLN
4.0000 mg | Freq: Once | INTRAVENOUS | Status: AC
  Administered 2024-07-11: 4 mg via INTRAVENOUS
  Filled 2024-07-11: qty 1

## 2024-07-11 MED ORDER — PIPERACILLIN-TAZOBACTAM 3.375 G IVPB
3.3750 g | Freq: Three times a day (TID) | INTRAVENOUS | Status: DC
  Administered 2024-07-11 – 2024-07-12 (×3): 3.375 g via INTRAVENOUS
  Filled 2024-07-11 (×3): qty 50

## 2024-07-11 MED ORDER — LACTATED RINGERS IV SOLN: INTRAVENOUS | Status: DC

## 2024-07-11 MED ORDER — ONDANSETRON HCL 4 MG PO TABS: 4.0000 mg | ORAL_TABLET | Freq: Four times a day (QID) | ORAL | Status: DC | PRN

## 2024-07-11 MED ORDER — SODIUM CHLORIDE 0.9 % IV BOLUS
500.0000 mL | Freq: Once | INTRAVENOUS | Status: AC
  Administered 2024-07-11: 500 mL via INTRAVENOUS

## 2024-07-11 MED ORDER — ONDANSETRON HCL 4 MG/2ML IJ SOLN
4.0000 mg | Freq: Four times a day (QID) | INTRAMUSCULAR | Status: DC | PRN
  Administered 2024-07-13: 4 mg via INTRAVENOUS

## 2024-07-11 MED ORDER — KETOROLAC TROMETHAMINE 30 MG/ML IJ SOLN
30.0000 mg | Freq: Once | INTRAMUSCULAR | Status: AC
  Administered 2024-07-11: 30 mg via INTRAVENOUS
  Filled 2024-07-11: qty 1

## 2024-07-11 MED ORDER — ONDANSETRON HCL 4 MG/2ML IJ SOLN
4.0000 mg | Freq: Once | INTRAMUSCULAR | Status: AC
  Administered 2024-07-11: 4 mg via INTRAVENOUS
  Filled 2024-07-11: qty 2

## 2024-07-11 MED ORDER — FLUTICASONE FUROATE-VILANTEROL 200-25 MCG/ACT IN AEPB
1.0000 | INHALATION_SPRAY | Freq: Every day | RESPIRATORY_TRACT | Status: DC
  Administered 2024-07-13 – 2024-07-14 (×2): 1 via RESPIRATORY_TRACT
  Filled 2024-07-11: qty 28

## 2024-07-11 MED ORDER — SODIUM CHLORIDE 0.9 % IV SOLN: INTRAVENOUS | Status: DC

## 2024-07-11 MED ORDER — OXYCODONE HCL 5 MG PO TABS
5.0000 mg | ORAL_TABLET | ORAL | Status: DC | PRN
  Administered 2024-07-11 – 2024-07-13 (×2): 5 mg via ORAL
  Filled 2024-07-11 (×2): qty 1

## 2024-07-11 MED ORDER — PIPERACILLIN-TAZOBACTAM 4.5 G IVPB: 4.5000 g | Freq: Three times a day (TID) | INTRAVENOUS | Status: DC

## 2024-07-11 MED ORDER — HYDROMORPHONE HCL 1 MG/ML IJ SOLN
0.5000 mg | INTRAMUSCULAR | Status: DC | PRN
  Administered 2024-07-13: 1 mg via INTRAVENOUS
  Filled 2024-07-11: qty 1

## 2024-07-11 MED ORDER — ALBUTEROL SULFATE (2.5 MG/3ML) 0.083% IN NEBU
3.0000 mL | INHALATION_SOLUTION | Freq: Four times a day (QID) | RESPIRATORY_TRACT | Status: DC | PRN
  Administered 2024-07-12: 3 mL via RESPIRATORY_TRACT
  Filled 2024-07-11: qty 3

## 2024-07-11 MED ORDER — LEVOTHYROXINE SODIUM 75 MCG PO TABS
75.0000 ug | ORAL_TABLET | Freq: Every day | ORAL | Status: DC
  Administered 2024-07-12 – 2024-07-14 (×2): 75 ug via ORAL
  Filled 2024-07-11 (×3): qty 1

## 2024-07-11 MED ORDER — PIPERACILLIN-TAZOBACTAM 3.375 G IVPB 30 MIN
3.3750 g | Freq: Once | INTRAVENOUS | Status: AC
  Administered 2024-07-11: 3.375 g via INTRAVENOUS
  Filled 2024-07-11: qty 50

## 2024-07-11 NOTE — H&P (Signed)
 History and Physical  Patient: [name] FMW:[id number] DOB: 1951/08/03 DOA: 07/11/2024 DOS: the patient was seen and examined on 07/11/2024 Patient coming from: Home  Chief Complaint:  Chief Complaint  Patient presents with   Abdominal Pain   Flank Pain        HPI: [name] is a 73 y.o. 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  Patient reported abdominal pain, periumbilical, right upper quadrant, radiating to the back, 10 out of 10 sharp and intermittent with nausea and vomiting.  No hematemesis or any diarrhea. No prior episodes of similar abdominal pain.  ED course:  In ED, temp 98.5 F, RR 20, pulse 86, BP initially 115/66 however then dropped to 86/49, O2 sats 98% on room air Labs showed sodium 137, calcium  3.7, BUN 10, creatinine 0.88 Alk phos 169, AST 377, ALT 241, total bilirubin 4.4, direct bilirubin 2.7, indirect 1.6 WBCs 14.2, hemoglobin 12.3  CT abdomen showed acute cholecystitis with gallbladder distention, wall thickening, surrounding inflammatory stranding and gallstones measuring up to 1.1 cm.  CBD dilation measuring 10 mm without a visible calcified ductal stone Lipase normal.  Review of Systems: As mentioned in the history of present illness. All other systems reviewed and are negative. 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   Past Surgical History:  Procedure Laterality Date   APPENDECTOMY  2008   APPLICATION OF CRANIAL NAVIGATION Right 01/11/2022   Procedure: APPLICATION OF CRANIAL NAVIGATION;  Surgeon: Joshua Alm RAMAN, MD;  Location: [facility] OR;  Service: Neurosurgery;  Laterality: Right;   COLON SURGERY  2008   colostomy-infection post op appendectomy    COLOSTOMY REVERSAL  2009   CRANIOTOMY Right 01/11/2022   Procedure: RIGHT CRANIOTOMY FOR TUMOR RESECTION WITH BRAIN LAB;  Surgeon: Joshua Alm RAMAN, MD;  Location: [facility] OR;  Service: Neurosurgery;  Laterality: Right;   INTERCOSTAL NERVE BLOCK Right 10/19/2020   Procedure: INTERCOSTAL NERVE BLOCK;  Surgeon: Kerrin Elspeth BROCKS, MD;  Location: [facility] OR;  Service: Thoracic;  Laterality: Right;   NODE DISSECTION Right 10/19/2020   Procedure: NODE DISSECTION;  Surgeon: Kerrin Elspeth BROCKS, MD;  Location: [facility] OR;  Service: Thoracic;  Laterality: Right;   VIDEO BRONCHOSCOPY WITH ENDOBRONCHIAL NAVIGATION N/A 05/08/2020   Procedure: VIDEO BRONCHOSCOPY WITH ENDOBRONCHIAL NAVIGATION;  Surgeon: Kerrin Elspeth BROCKS, MD;  Location: [facility] OR;  Service: Thoracic;  Laterality: N/A;   VIDEO BRONCHOSCOPY WITH ENDOBRONCHIAL ULTRASOUND N/A 05/08/2020   Procedure: VIDEO BRONCHOSCOPY WITH ENDOBRONCHIAL ULTRASOUND;  Surgeon: Kerrin Elspeth BROCKS, MD;  Location: [facility] OR;  Service: Thoracic;  Laterality: N/A;   Social History:  reports that she quit smoking about 4 years ago. Her smoking use included cigarettes. She started smoking about 54 years ago. She has a 2.5 pack-year smoking history. She has never used smokeless tobacco. She reports that she does not currently use alcohol after a past usage of about 10.0 standard drinks of alcohol per week. She reports that she does not use drugs. No Known Allergies Family History  Problem Relation Age of Onset   Mental illness Mother    Colon cancer Mother 37   Cancer Father    Colon polyps Neg Hx    Esophageal cancer Neg Hx  Stomach cancer Neg Hx    Rectal cancer Neg Hx    Prior to Admission medications   Medication Sig Start Date End Date Taking? Authorizing Provider  acetaminophen  (TYLENOL ) 325 MG tablet Take 1-2 tablets (325-650 mg total) by mouth every 4 (four) hours as needed for mild pain. 01/08/22   Love, Sharlet RAMAN, PA-C  albuterol  (PROVENTIL  HFA;VENTOLIN  HFA) 108 (90 Base)  MCG/ACT inhaler Inhale 2 puffs into the lungs every 4 (four) hours as needed for wheezing or shortness of breath (cough, shortness of breath or wheezing.). 10/29/17   Juliane Che, PA  alendronate (FOSAMAX) 70 MG tablet Take 70 mg by mouth once a week. 06/15/21   [provider]  Cholecalciferol  (VITAMIN D3 PO) Take 2,000 Units by mouth daily.    [provider]  fluticasone  (FLONASE ) 50 MCG/ACT nasal spray Place 2 sprays into both nostrils daily. 01/06/18   Juliane Che, PA  Fluticasone -Salmeterol (ADVAIR) 250-50 MCG/DOSE AEPB Inhale 1 puff into the lungs 2 (two) times daily.  02/05/18   [provider]  levothyroxine  (SYNTHROID ) 75 MCG tablet TAKE 1 TABLET BY MOUTH EVERY DAY BEFORE BREAKFAST 01/05/24   Heilingoetter, Cassandra L, PA-C  Multiple Vitamin (MULTIVITAMIN) tablet Take 1 tablet by mouth daily.    [provider]   Physical Exam: Vitals:   07/11/24 1233 07/11/24 1330 07/11/24 1400 07/11/24 1507  BP: (!) 92/47 (!) 99/51 (!) 102/91 (!) 92/50  Pulse: 66 63  69  Resp:    18  Temp:    98.2 F (36.8 C)  TempSrc:    Oral  SpO2: 95% 99%  98%  Weight:      Height:         General: Alert, awake, oriented x3, NAD Eyes: pink conjunctiva, anicteric sclera, PERLA,  HEENT: normocephalic, atraumatic, oropharynx clear, dry mucosal membrane Neck: supple, no masses or lymphadenopathy, no JVD CVS: Regular rate and rhythm, no murmurs, rubs or gallops. Resp : Clear to auscultation bilaterally, no wheezing, rales or rhonchi. GI : Soft, minimally tender RUQ  nondistended, NBS   Ext: No lower extremity edema  Musculoskeletal: No clubbing or cyanosis, positive pedal pulses. No contracture. ROM intact  Neuro: Grossly intact, no focal neurological deficits, strength 5/5 upper and lower extremities bilaterally Psych: alert and oriented x 3, normal mood and affect Skin: no rashes or lesions, warm and dry   Data Reviewed: I have reviewed ED notes, Vitals, Lab  results and outpatient records.   Recent Labs  Lab 07/11/24 1025  NA 137  K 3.7  CL 99  CO2 26  GLUCOSE 110*  BUN 10  CREATININE 0.82  CALCIUM  9.8   Recent Labs  Lab 07/11/24 1025  WBC 14.2*  HGB 12.3  HCT 37.0  MCV 94.4  PLT 225    Assessment and Plan  Acute abdominal pain/acute cholecystitis with SIRS - CT abdomen reviewed  - Placed on n.p.o. status, IV fluids, pain control, antiemetics - Given hypotension, leukocytosis, transaminitis, concern for cholangitis, obtain blood cultures, placed on IV Zosyn - Ordered MRCP, GI consulted, may need ERCP given CBD dilation, rule out choledocholithiasis - Discussed with Dr Lyndel, recommended to proceed with MRCP, if negative, will do cholecystectomy - Currently asymptomatic with soft BP, will place on midodrine if no improvement in BP with IV fluids    COPD  -No acute wheezing or shortness of breath -Continue Flonase , Breo Ellipta , albuterol  inhaler as needed    Hypothyroidism -Follow TSH, continue Synthroid     Malignant neoplasm of  lung metastatic to brain [facility]) -Non-small cell lung CA, adeno CA, initially diagnosed as stage IIIa in April 2023, underwent neoadjuvant treatment for brain metastasis, right posterior parietal craniotomy and SRS.  - Outpatient follow-up with Dr. Sherrod     Advance Care Planning:   Code Status: Full Code discussed with the patient Consults: [facility], general surgery Family Communication: Sister-in-law at the bedside Severity of Illness:      The appropriate patient status for this patient is INPATIENT. Inpatient status is judged to be reasonable and necessary in order to provide the required intensity of service to ensure the patient's safety. The patient's presenting symptoms, physical exam findings, and initial radiographic and laboratory data in the context of their chronic comorbidities is felt to place them at high risk for further clinical deterioration. Furthermore, it is not  anticipated that the patient will be medically stable for discharge from the hospital within 2 midnights of admission.   * I certify that at the point of admission it is my clinical judgment that the patient will require inpatient hospital care spanning beyond 2 midnights from the point of admission due to high intensity of service, high risk for further deterioration and high frequency of surveillance required.*    Author: Nydia Distance, MD 07/11/2024 4:20 PM For on call review [email or web address].

## 2024-07-11 NOTE — ED Triage Notes (Signed)
 Pt reports abd pain starting x2 days ago radiating to back. Pt endorses N/V. Pt reports constipation since Friday (24OCT). Pt reports dark urine. Pt denies any hx of kidney stones.

## 2024-07-11 NOTE — ED Notes (Signed)
 Patient transported to CT

## 2024-07-11 NOTE — ED Notes (Signed)
 Called [facility] to transport the patient to [facility] 4E rm# [id number]

## 2024-07-11 NOTE — Progress Notes (Signed)
 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  Temperature 98.5, HR 85, BP 115/66, SpO2 98% room air. WBC 14.2, hemoglobin 12.3.  Lipase 27.  AST 377, ALT 241, total bilirubin 1.6.  Urinalysis unrevealing.  CT renal stone study with findings of acute cholecystitis with gallbladder distention, wall thickening, surrounding inflammatory stranding and gallstones measuring up to 1.1 cm.  Also with common bile duct dilation measuring 9-10 mm without a visible calcified ductal stone.  EDP discussed with general surgery, Dr. Lyndel; requested a medical admission and recommends MRCP.  Received Zofran , morphine , Toradol , NS bolus.  Started on Zosyn.  Plan of care: The patient is accepted for admission to Progressive unit, at [facility]   Call general surgery on patient arrival Will need MRCP; if possible will likely need GI involvement for ERCP  Author: Camellia PARAS Collette Pescador, DO 07/11/2024  Check [email or web address] for on-call coverage.  Nursing staff, Please call [facility] Admits & Consults System-Wide number on Amion as soon as patient's arrival, so appropriate admitting provider can evaluate the pt.

## 2024-07-11 NOTE — ED Provider Notes (Signed)
 [place] EMERGENCY DEPARTMENT AT [facility] Provider Note   CSN: [id number] Arrival date & time: 07/11/24  1017     Patient presents with: Abdominal Pain and Flank Pain (/)   [name] is a 73 y.o. female.  {Add pertinent medical, surgical, social history, OB history to YEP:67052} Patient is a 73 year old female with past medical history of COPD, lung cancer.  Patient presenting today with complaints of right flank and abdominal pain.  Symptoms began suddenly 2 days ago and have come and gone since.  Pain worsened in the night along with episodes of vomiting.  She denies any fevers or chills.  No bowel or urinary complaints.  She denies history of kidney stones.  No prior abdominal surgeries.       Prior to Admission medications   Medication Sig Start Date End Date Taking? Authorizing Provider  acetaminophen  (TYLENOL ) 325 MG tablet Take 1-2 tablets (325-650 mg total) by mouth every 4 (four) hours as needed for mild pain. 01/08/22   Love, Sharlet RAMAN, PA-C  albuterol  (PROVENTIL  HFA;VENTOLIN  HFA) 108 (90 Base) MCG/ACT inhaler Inhale 2 puffs into the lungs every 4 (four) hours as needed for wheezing or shortness of breath (cough, shortness of breath or wheezing.). 10/29/17   Juliane Che, PA  alendronate (FOSAMAX) 70 MG tablet Take 70 mg by mouth once a week. 06/15/21   [provider]  Cholecalciferol  (VITAMIN D3 PO) Take 2,000 Units by mouth daily.    [provider]  fluticasone  (FLONASE ) 50 MCG/ACT nasal spray Place 2 sprays into both nostrils daily. 01/06/18   Juliane Che, PA  Fluticasone -Salmeterol (ADVAIR) 250-50 MCG/DOSE AEPB Inhale 1 puff into the lungs 2 (two) times daily.  02/05/18   [provider]  levothyroxine  (SYNTHROID ) 75 MCG tablet TAKE 1 TABLET BY MOUTH EVERY DAY BEFORE BREAKFAST 01/05/24   Heilingoetter, Cassandra L, PA-C  Multiple Vitamin (MULTIVITAMIN) tablet Take 1 tablet by mouth daily.    [provider]     Allergies: Patient has no known allergies.    Review of Systems  All other systems reviewed and are negative.   Updated Vital Signs BP 115/66   Pulse 86   Temp 98.5 F (36.9 C) (Oral)   Resp 20   Ht 5' 3 (1.6 m)   Wt 50.8 kg   SpO2 98%   BMI 19.84 kg/m   Physical Exam Vitals and nursing note reviewed.  Constitutional:      General: She is not in acute distress.    Appearance: She is well-developed. She is not diaphoretic.  HENT:     Head: Normocephalic and atraumatic.  Cardiovascular:     Rate and Rhythm: Normal rate and regular rhythm.     Heart sounds: No murmur heard.    No friction rub. No gallop.  Pulmonary:     Effort: Pulmonary effort is normal. No respiratory distress.     Breath sounds: Normal breath sounds. No wheezing.  Abdominal:     General: Bowel sounds are normal. There is no distension.     Palpations: Abdomen is soft.     Tenderness: There is abdominal tenderness in the right upper quadrant and right lower quadrant. There is right CVA tenderness. There is no guarding or rebound.  Musculoskeletal:        General: Normal range of motion.     Cervical back: Normal range of motion and neck supple.  Skin:    General: Skin is warm and dry.  Neurological:  General: No focal deficit present.     Mental Status: She is alert and oriented to person, place, and time.     (all labs ordered are listed, but only abnormal results are displayed) Labs Reviewed  URINALYSIS, ROUTINE W REFLEX MICROSCOPIC  BASIC METABOLIC PANEL WITH GFR  CBC    EKG: None  Radiology: No results found.  {Document cardiac monitor, telemetry assessment procedure when appropriate:32947} Procedures   Medications Ordered in the ED  ketorolac  (TORADOL ) 30 MG/ML injection 30 mg (has no administration in time range)  morphine  (PF) 4 MG/ML injection 4 mg (has no administration in time range)  ondansetron  (ZOFRAN ) injection 4 mg (has no administration in time range)       {Click here for ABCD2, HEART and other calculators REFRESH Note before signing:1}                              Medical Decision Making Amount and/or Complexity of Data Reviewed Labs: ordered. Radiology: ordered.  Risk Prescription drug management.   ***  {Document critical care time when appropriate  Document review of labs and clinical decision tools ie CHADS2VASC2, etc  Document your independent review of radiology images and any outside records  Document your discussion with family members, caretakers and with consultants  Document social determinants of health affecting pt's care  Document your decision making why or why not admission, treatments were needed:32947:::1}   Final diagnoses:  None    ED Discharge Orders     None

## 2024-07-11 NOTE — ED Notes (Signed)
[facility] to bedside.  

## 2024-07-11 NOTE — Consult Note (Signed)
 Reason for Consult: Abdominal pain Referring Physician: Dr. Davia Inocente BIRCH Record is an 73 y.o. female.  HPI: The patient is a 73 year old white female who presents with abdominal pain that started on Friday.  The pain was pretty severe.  The pain was mostly in the right upper quadrant and radiated into her back.  The pain was associated with significant nausea and vomiting.  She came to the emergency department where a scan did show stones in the gallbladder as well as some possible thickening of the gallbladder wall.  Her liver functions were elevated significantly.  She does have some COPD.  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    Past Surgical History:  Procedure Laterality Date   APPENDECTOMY  2008   APPLICATION OF CRANIAL NAVIGATION Right 01/11/2022   Procedure: APPLICATION OF CRANIAL NAVIGATION;  Surgeon: Joshua Alm RAMAN, MD;  Location: [facility] OR;  Service: Neurosurgery;  Laterality: Right;   COLON SURGERY  2008   colostomy-infection post op appendectomy   COLOSTOMY REVERSAL  2009   CRANIOTOMY Right 01/11/2022   Procedure: RIGHT CRANIOTOMY FOR TUMOR RESECTION WITH BRAIN LAB;  Surgeon: Joshua Alm RAMAN, MD;  Location: [facility] OR;  Service: Neurosurgery;  Laterality: Right;   INTERCOSTAL NERVE BLOCK Right 10/19/2020   Procedure: INTERCOSTAL NERVE BLOCK;  Surgeon: Kerrin Elspeth BROCKS, MD;  Location: [facility] OR;  Service: Thoracic;  Laterality: Right;   NODE DISSECTION Right 10/19/2020   Procedure: NODE DISSECTION;  Surgeon: Kerrin Elspeth BROCKS, MD;  Location: [facility] OR;  Service: Thoracic;  Laterality: Right;   VIDEO BRONCHOSCOPY WITH ENDOBRONCHIAL NAVIGATION N/A 05/08/2020   Procedure: VIDEO BRONCHOSCOPY WITH ENDOBRONCHIAL NAVIGATION;  Surgeon: Kerrin Elspeth BROCKS, MD;  Location: [facility]  OR;  Service: Thoracic;  Laterality: N/A;   VIDEO BRONCHOSCOPY WITH ENDOBRONCHIAL ULTRASOUND N/A 05/08/2020   Procedure: VIDEO BRONCHOSCOPY WITH ENDOBRONCHIAL ULTRASOUND;  Surgeon: Kerrin Elspeth BROCKS, MD;  Location: [facility] OR;  Service: Thoracic;  Laterality: N/A;    Family History  Problem Relation Age of Onset   Mental illness Mother    Colon cancer Mother 58   Cancer Father    Colon polyps Neg Hx    Esophageal cancer Neg Hx    Stomach cancer Neg Hx    Rectal cancer Neg Hx     Social History:  reports that she quit smoking about 4 years ago. Her smoking use included cigarettes. She started smoking about 54 years ago. She has a 2.5 pack-year smoking history. She has never used smokeless tobacco. She reports that she does not currently use alcohol after a past usage of about 10.0 standard drinks of alcohol per week. She reports that she does not use drugs.  Allergies: No Known Allergies  Medications: I have reviewed the patient's current medications.  Results for orders placed or performed during the hospital encounter of 07/11/24 (from the past 48 hours)  Urinalysis, Routine w reflex microscopic -Urine, Clean Catch     Status: Abnormal   Collection Time: 07/11/24 10:25 AM  Result Value Ref Range   Color, Urine YELLOW YELLOW   APPearance HAZY (A) CLEAR   Specific Gravity, Urine 1.020 1.005 - 1.030   pH 6.0 5.0 - 8.0   Glucose, UA NEGATIVE NEGATIVE mg/dL   Hgb urine dipstick NEGATIVE NEGATIVE  Bilirubin Urine MODERATE (A) NEGATIVE   Ketones, ur NEGATIVE NEGATIVE mg/dL   Protein, ur TRACE (A) NEGATIVE mg/dL   Nitrite NEGATIVE NEGATIVE   Leukocytes,Ua NEGATIVE NEGATIVE   RBC / HPF 0-5 0 - 5 RBC/hpf   WBC, UA 11-20 0 - 5 WBC/hpf   Bacteria, UA RARE (A) NONE SEEN   Squamous Epithelial / HPF 0-5 0 - 5 /HPF   Mucus PRESENT    Non Squamous Epithelial 0-5 (A) NONE SEEN    Comment: Performed at [facility], [address], [place], [place] [address]  Basic  metabolic panel     Status: Abnormal   Collection Time: 07/11/24 10:25 AM  Result Value Ref Range   Sodium 137 135 - 145 mmol/L   Potassium 3.7 3.5 - 5.1 mmol/L   Chloride 99 98 - 111 mmol/L   CO2 26 22 - 32 mmol/L   Glucose, Bld 110 (H) 70 - 99 mg/dL    Comment: Glucose reference range applies only to samples taken after fasting for at least 8 hours.   BUN 10 8 - 23 mg/dL   Creatinine, Ser 9.17 0.44 - 1.00 mg/dL   Calcium  9.8 8.9 - 10.3 mg/dL   GFR, Estimated >39 >39 mL/min    Comment: (NOTE) Calculated using the CKD-EPI Creatinine Equation (2021)    Anion gap 12 5 - 15    Comment: Performed at [facility], [address] [place], [place] [address]  CBC     Status: Abnormal   Collection Time: 07/11/24 10:25 AM  Result Value Ref Range   WBC 14.2 (H) 4.0 - 10.5 K/uL   RBC 3.92 3.87 - 5.11 MIL/uL   Hemoglobin 12.3 12.0 - 15.0 g/dL   HCT 62.9 63.9 - 53.9 %   MCV 94.4 80.0 - 100.0 fL   MCH 31.4 26.0 - 34.0 pg   MCHC 33.2 30.0 - 36.0 g/dL   RDW 86.1 88.4 - 84.4 %   Platelets 225 150 - 400 K/uL   nRBC 0.0 0.0 - 0.2 %    Comment: Performed at [facility], [address] [place], [place] [address]  Hepatic function panel     Status: Abnormal   Collection Time: 07/11/24 10:25 AM  Result Value Ref Range   Total Protein 7.2 6.5 - 8.1 g/dL   Albumin  4.2 3.5 - 5.0 g/dL   AST 622 (H) 15 - 41 U/L   ALT 241 (H) 0 - 44 U/L   Alkaline Phosphatase 169 (H) 38 - 126 U/L   Total Bilirubin 4.4 (H) 0.0 - 1.2 mg/dL   Bilirubin, Direct 2.7 (H) 0.0 - 0.2 mg/dL   Indirect Bilirubin 1.6 (H) 0.3 - 0.9 mg/dL    Comment: Performed at [facility], [address], [place], [place] [address]  Lipase, blood     Status: None   Collection Time: 07/11/24 10:25 AM  Result Value Ref Range   Lipase 27 11 - 51 U/L    Comment: Performed at [facility], [address], [place], [place] [address]    CT Renal Stone  Study Result Date: 07/11/2024 EXAM: CT UROGRAM 07/11/2024 11:00:41 AM TECHNIQUE: CT of the abdomen and pelvis was performed without the administration of intravenous contrast as per CT urogram protocol. Multiplanar reformatted images as well as MIP urogram images are provided for review. Automated exposure control, iterative reconstruction, and/or weight based adjustment of the mA/kV was utilized to reduce the radiation dose to as low as reasonably  achievable. COMPARISON: 04/12/2024 CLINICAL HISTORY: Abdominal/flank pain, stone suspected. Patient reports abdominal pain starting 2 days ago radiating to back. Patient endorses nausea/vomiting. Patient reports constipation since Friday (24OCT). Patient reports dark urine. Patient denies any history of kidney stones. FINDINGS: LOWER CHEST: No acute abnormality. LIVER: No focal liver abnormality. GALLBLADDER AND BILE DUCTS: Gallbladder distention with wall thickening and surrounding soft tissue stranding. Stones within the gallbladder measure up to 1.1 cm (image 26/2). Common bile duct measures 9 to 10 mm in diameter (image 51/3). No calcified stone identified along the course of the common bile duct. SPLEEN: No acute abnormality. PANCREAS: No acute abnormality. ADRENAL GLANDS: No acute abnormality. KIDNEYS, URETERS AND BLADDER: No nephrolithiasis or obstructive uropathy. Bladder appears decompressed. No perinephric or periureteral stranding. GI AND BOWEL: Stomach demonstrates no acute abnormality. Moderate retained stool noted within the colon. Scattered colonic diverticula without signs of acute diverticulitis. There is no bowel obstruction. PERITONEUM AND RETROPERITONEUM: No ascites. No free air. A small fat-containing supraumbilical and periumbilical hernia is noted. VASCULATURE: Aorta is normal in caliber. Aortic atherosclerosis. LYMPH NODES: No lymphadenopathy. REPRODUCTIVE ORGANS: No acute abnormality. 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 Curvature of the lumbar spine is convex towards the left. Lumbar degenerative disc disease noted. No acute osseous abnormality. No focal soft tissue abnormality. IMPRESSION: 1. Acute cholecystitis with gallbladder distention, wall thickening, surrounding inflammatory stranding, and gallstones measuring up to 1.1 cm. 2. Common bile duct dilation measuring 910 mm without a visible calcified ductal stone. Electronically signed by: Waddell Calk MD 07/11/2024 11:25 AM EDT RP Workstation: [id number]    Review of Systems  Constitutional: Negative.   HENT: Negative.    Eyes: Negative.   Respiratory: Negative.    Cardiovascular: Negative.   Gastrointestinal:  Positive for abdominal pain, nausea and vomiting.  Endocrine: Negative.   Genitourinary: Negative.   Musculoskeletal: Negative.   Skin: Negative.   Allergic/Immunologic: Negative.   Neurological: Negative.   Hematological: Negative.   Psychiatric/Behavioral: Negative.     Blood pressure (!) 92/50, pulse 69, temperature 98.2 F (36.8 C), temperature source Oral, resp. rate 18, height 5' 3 (1.6 m), weight 50.8 kg, SpO2 98%. Physical Exam Vitals reviewed.  Constitutional:      General: She is not in acute distress.    Appearance: Normal appearance.  HENT:     Head: Normocephalic and atraumatic.     Right Ear: External ear normal.     Left Ear: External ear normal.     Nose: Nose normal.     Mouth/Throat:     Mouth: Mucous membranes are moist.     Pharynx: Oropharynx is clear.  Eyes:     Extraocular Movements: Extraocular movements intact.     Conjunctiva/sclera: Conjunctivae normal.     Pupils: Pupils are equal, round, and reactive to light.  Cardiovascular:     Rate and Rhythm: Normal rate and regular rhythm.     Pulses: Normal pulses.     Heart sounds: Normal heart sounds.  Pulmonary:     Effort: Pulmonary effort is normal. No respiratory distress.     Breath sounds: Normal breath sounds.   Abdominal:     General: Abdomen is flat.     Palpations: Abdomen is soft.     Comments: There is mild tenderness to palpation in the right upper quadrant  Musculoskeletal:        General: No swelling or deformity. Normal range of motion.     Cervical back:  Normal range of motion and neck supple.  Skin:    General: Skin is warm and dry.  Neurological:     General: No focal deficit present.     Mental Status: She is alert and oriented to person, place, and time.  Psychiatric:        Mood and Affect: Mood normal.        Behavior: Behavior normal.     Assessment/Plan: The patient appears to have cholecystitis with cholelithiasis and significant elevation of her liver functions worrisome for a common bile duct stone.  She is scheduled for an MRCP tonight.  If it shows a stone in the common duct then she would need a GI consult and ERCP prior to surgery.  If there is no stone in the common duct then we will wait for her liver functions to return closer to normal and then consider surgery to remove the gallbladder and stones.  We will follow her closely with you.  Deward Null III 07/11/2024, 6:10 PM

## 2024-07-12 ENCOUNTER — Inpatient Hospital Stay (HOSPITAL_COMMUNITY)

## 2024-07-12 ENCOUNTER — Inpatient Hospital Stay (HOSPITAL_COMMUNITY): Admitting: Certified Registered Nurse Anesthetist

## 2024-07-12 ENCOUNTER — Ambulatory Visit (HOSPITAL_COMMUNITY): Admission: RE | Admit: 2024-07-12 | Source: Ambulatory Visit

## 2024-07-12 ENCOUNTER — Encounter (HOSPITAL_COMMUNITY)
Admission: EM | Disposition: A | Discharge status: Home / Self Care | Payer: Self-pay | Source: Home / Self Care | Attending: Internal Medicine

## 2024-07-12 ENCOUNTER — Encounter (HOSPITAL_COMMUNITY): Payer: Self-pay

## 2024-07-12 ENCOUNTER — Encounter (HOSPITAL_COMMUNITY): Payer: Self-pay | Admitting: *Deleted

## 2024-07-12 ENCOUNTER — Inpatient Hospital Stay

## 2024-07-12 DIAGNOSIS — J449 Chronic obstructive pulmonary disease, unspecified: Secondary | ICD-10-CM | POA: Diagnosis not present

## 2024-07-12 DIAGNOSIS — K810 Acute cholecystitis: Secondary | ICD-10-CM | POA: Diagnosis not present

## 2024-07-12 DIAGNOSIS — K819 Cholecystitis, unspecified: Secondary | ICD-10-CM

## 2024-07-12 DIAGNOSIS — E039 Hypothyroidism, unspecified: Secondary | ICD-10-CM | POA: Diagnosis not present

## 2024-07-12 HISTORY — PX: CHOLECYSTECTOMY: SHX55

## 2024-07-12 LAB — COMPREHENSIVE METABOLIC PANEL WITH GFR
ALT: 158 U/L — ABNORMAL HIGH (ref 0–44)
AST: 133 U/L — ABNORMAL HIGH (ref 15–41)
Albumin: 3.5 g/dL (ref 3.5–5.0)
Alkaline Phosphatase: 142 U/L — ABNORMAL HIGH (ref 38–126)
Anion gap: 9 (ref 5–15)
BUN: 12 mg/dL (ref 8–23)
CO2: 25 mmol/L (ref 22–32)
Calcium: 8.3 mg/dL — ABNORMAL LOW (ref 8.9–10.3)
Chloride: 105 mmol/L (ref 98–111)
Creatinine, Ser: 0.6 mg/dL (ref 0.44–1.00)
GFR, Estimated: >60 mL/min (ref >60)
Glucose, Bld: 83 mg/dL (ref 70–99)
Potassium: 3.7 mmol/L (ref 3.5–5.1)
Sodium: 139 mmol/L (ref 135–145)
Total Bilirubin: 1.5 mg/dL — ABNORMAL HIGH (ref 0.0–1.2)
Total Protein: 6 g/dL — ABNORMAL LOW (ref 6.5–8.1)

## 2024-07-12 LAB — CBC
HCT: 33.6 % — ABNORMAL LOW (ref 36.0–46.0)
Hemoglobin: 10.3 g/dL — ABNORMAL LOW (ref 12.0–15.0)
MCH: 30.5 pg (ref 26.0–34.0)
MCHC: 30.7 g/dL (ref 30.0–36.0)
MCV: 99.4 fL (ref 80.0–100.0)
Platelets: 184 K/uL (ref 150–400)
RBC: 3.38 MIL/uL — ABNORMAL LOW (ref 3.87–5.11)
RDW: 14 % (ref 11.5–15.5)
WBC: 10.7 K/uL — ABNORMAL HIGH (ref 4.0–10.5)
nRBC: 0 % (ref 0.0–0.2)

## 2024-07-12 LAB — SURGICAL PCR SCREEN
MRSA, PCR: NEGATIVE
Staphylococcus aureus: NEGATIVE

## 2024-07-12 SURGERY — LAPAROSCOPIC CHOLECYSTECTOMY WITH INTRAOPERATIVE CHOLANGIOGRAM
Anesthesia: General

## 2024-07-12 MED ORDER — MUPIROCIN 2 % EX OINT
1.0000 | TOPICAL_OINTMENT | Freq: Two times a day (BID) | CUTANEOUS | Status: DC
  Administered 2024-07-12: 1 via NASAL
  Filled 2024-07-12: qty 22

## 2024-07-12 MED ORDER — DEXAMETHASONE SOD PHOSPHATE PF 10 MG/ML IJ SOLN
INTRAMUSCULAR | Status: DC | PRN
  Administered 2024-07-12: 5 mg via INTRAVENOUS

## 2024-07-12 MED ORDER — LACTATED RINGERS IV SOLN
INTRAVENOUS | Status: DC
  Administered 2024-07-12: 10 mL via INTRAVENOUS

## 2024-07-12 MED ORDER — ORAL CARE MOUTH RINSE: 15.0000 mL | Freq: Once | OROMUCOSAL | Status: AC

## 2024-07-12 MED ORDER — GLUCAGON HCL RDNA (DIAGNOSTIC) 1 MG IJ SOLR
INTRAMUSCULAR | Status: DC | PRN
  Administered 2024-07-12: .25 mg via INTRAVENOUS

## 2024-07-12 MED ORDER — ROCURONIUM BROMIDE 10 MG/ML (PF) SYRINGE
PREFILLED_SYRINGE | INTRAVENOUS | Status: DC | PRN
  Administered 2024-07-12: 40 mg via INTRAVENOUS

## 2024-07-12 MED ORDER — BUPIVACAINE-EPINEPHRINE 0.25% -1:200000 IJ SOLN
INTRAMUSCULAR | Status: DC | PRN
  Administered 2024-07-12: 30 mL

## 2024-07-12 MED ORDER — LIDOCAINE HCL (PF) 2 % IJ SOLN
INTRAMUSCULAR | Status: DC | PRN
  Administered 2024-07-12: 50 mg via INTRADERMAL

## 2024-07-12 MED ORDER — ACETAMINOPHEN 10 MG/ML IV SOLN
INTRAVENOUS | Status: DC | PRN
  Administered 2024-07-12: 1000 mg via INTRAVENOUS

## 2024-07-12 MED ORDER — MIDAZOLAM HCL 2 MG/2ML IJ SOLN
INTRAMUSCULAR | Status: AC
  Filled 2024-07-12: qty 2

## 2024-07-12 MED ORDER — OXYCODONE HCL 5 MG PO TABS: 5.0000 mg | ORAL_TABLET | Freq: Once | ORAL | Status: DC | PRN

## 2024-07-12 MED ORDER — IOPAMIDOL (ISOVUE-300) INJECTION 61%
INTRAVENOUS | Status: DC | PRN
  Administered 2024-07-12: 15 mL

## 2024-07-12 MED ORDER — SODIUM CHLORIDE 0.9 % IV SOLN: INTRAVENOUS | Status: DC

## 2024-07-12 MED ORDER — MIDAZOLAM HCL (PF) 2 MG/2ML IJ SOLN
INTRAMUSCULAR | Status: DC | PRN
  Administered 2024-07-12: 1 mg via INTRAVENOUS

## 2024-07-12 MED ORDER — ACETAMINOPHEN 10 MG/ML IV SOLN: 1000.0000 mg | Freq: Once | INTRAVENOUS | Status: DC | PRN

## 2024-07-12 MED ORDER — PROPOFOL 10 MG/ML IV BOLUS
INTRAVENOUS | Status: AC
Start: 2024-07-12 — End: 2024-07-12
  Filled 2024-07-12: qty 20

## 2024-07-12 MED ORDER — OXYCODONE HCL 5 MG/5ML PO SOLN: 5.0000 mg | Freq: Once | ORAL | Status: DC | PRN

## 2024-07-12 MED ORDER — GADOBUTROL 1 MMOL/ML IV SOLN
5.0000 mL | Freq: Once | INTRAVENOUS | Status: AC | PRN
  Administered 2024-07-12: 5 mL via INTRAVENOUS

## 2024-07-12 MED ORDER — BUPIVACAINE-EPINEPHRINE (PF) 0.25% -1:200000 IJ SOLN
INTRAMUSCULAR | Status: AC
  Filled 2024-07-12: qty 30

## 2024-07-12 MED ORDER — PHENYLEPHRINE HCL-NACL 20-0.9 MG/250ML-% IV SOLN
INTRAVENOUS | Status: DC | PRN
  Administered 2024-07-12: 160 ug via INTRAVENOUS
  Administered 2024-07-12: 25 ug/min via INTRAVENOUS
  Administered 2024-07-12 (×2): 80 ug via INTRAVENOUS

## 2024-07-12 MED ORDER — PROPOFOL 10 MG/ML IV BOLUS
INTRAVENOUS | Status: DC | PRN
  Administered 2024-07-12: 90 mg via INTRAVENOUS

## 2024-07-12 MED ORDER — CHLORHEXIDINE GLUCONATE 0.12 % MT SOLN
15.0000 mL | Freq: Once | OROMUCOSAL | Status: AC
  Administered 2024-07-12: 15 mL via OROMUCOSAL
  Filled 2024-07-12: qty 15

## 2024-07-12 MED ORDER — FENTANYL CITRATE (PF) 50 MCG/ML IJ SOSY: 25.0000 ug | PREFILLED_SYRINGE | INTRAMUSCULAR | Status: DC | PRN

## 2024-07-12 MED ORDER — PIPERACILLIN-TAZOBACTAM 3.375 G IVPB
3.3750 g | Freq: Three times a day (TID) | INTRAVENOUS | Status: DC
  Administered 2024-07-13 – 2024-07-14 (×5): 3.375 g via INTRAVENOUS
  Filled 2024-07-12 (×5): qty 50

## 2024-07-12 MED ORDER — CIPROFLOXACIN IN D5W 400 MG/200ML IV SOLN: 400.0000 mg | Freq: Once | INTRAVENOUS | Status: DC

## 2024-07-12 MED ORDER — FENTANYL CITRATE (PF) 250 MCG/5ML IJ SOLN
INTRAMUSCULAR | Status: DC | PRN
  Administered 2024-07-12 (×2): 50 ug via INTRAVENOUS

## 2024-07-12 MED ORDER — SUGAMMADEX SODIUM 200 MG/2ML IV SOLN
INTRAVENOUS | Status: DC | PRN
Start: 2024-07-12 — End: 2024-07-12
  Administered 2024-07-12: 100 mg via INTRAVENOUS

## 2024-07-12 MED ORDER — GLUCAGON HCL RDNA (DIAGNOSTIC) 1 MG IJ SOLR
INTRAMUSCULAR | Status: AC
  Filled 2024-07-12: qty 1

## 2024-07-12 MED ORDER — ONDANSETRON HCL 4 MG/2ML IJ SOLN
INTRAMUSCULAR | Status: DC | PRN
  Administered 2024-07-12: 4 mg via INTRAVENOUS

## 2024-07-12 MED ORDER — ACETAMINOPHEN 10 MG/ML IV SOLN
INTRAVENOUS | Status: AC
Start: 2024-07-12 — End: 2024-07-12
  Filled 2024-07-12: qty 100

## 2024-07-12 MED ORDER — FENTANYL CITRATE (PF) 100 MCG/2ML IJ SOLN
INTRAMUSCULAR | Status: AC
  Filled 2024-07-12: qty 2

## 2024-07-12 SURGICAL SUPPLY — 37 items
BLADE CLIPPER SURG (BLADE) IMPLANT
CABLE HIGH FREQUENCY MONO STRZ (ELECTRODE) ×1 IMPLANT
CHLORAPREP W/TINT 26 (MISCELLANEOUS) ×1 IMPLANT
CLIP APPLIE 5 13 M/L LIGAMAX5 (MISCELLANEOUS) ×1 IMPLANT
COVER MAYO STAND XLG (MISCELLANEOUS) IMPLANT
COVER SURGICAL LIGHT HANDLE (MISCELLANEOUS) ×1 IMPLANT
DERMABOND ADVANCED .7 DNX12 (GAUZE/BANDAGES/DRESSINGS) ×1 IMPLANT
DISSECTOR BLUNT TIP ENDO 5MM (MISCELLANEOUS) IMPLANT
DRAPE C-ARM 42X120 X-RAY (DRAPES) IMPLANT
ELECT PENCIL ROCKER SW 15FT (MISCELLANEOUS) ×1 IMPLANT
ELECT REM PT RETURN 15FT ADLT (MISCELLANEOUS) ×1 IMPLANT
ENDOLOOP SUT PDS II 0 18 (SUTURE) IMPLANT
GLOVE BIO SURGEON STRL SZ 6.5 (GLOVE) ×1 IMPLANT
GLOVE BIOGEL PI IND STRL 6 (GLOVE) ×1 IMPLANT
GOWN STRL REUS W/ TWL LRG LVL3 (GOWN DISPOSABLE) ×1 IMPLANT
GOWN STRL REUS W/ TWL XL LVL3 (GOWN DISPOSABLE) IMPLANT
IRRIGATION SUCT STRKRFLW 2 WTP (MISCELLANEOUS) IMPLANT
KIT BASIN OR (CUSTOM PROCEDURE TRAY) ×1 IMPLANT
LHOOK LAP DISP 36CM (ELECTROSURGICAL) IMPLANT
NDL INSUFFLATION 14GA 120MM (NEEDLE) IMPLANT
NEEDLE INSUFFLATION 14GA 120MM (NEEDLE) ×1 IMPLANT
NS IRRIG 1000ML POUR BTL (IV SOLUTION) ×1 IMPLANT
PAD ARMBOARD POSITIONER FOAM (MISCELLANEOUS) ×1 IMPLANT
POUCH RETRIEVAL ECOSAC 10 (ENDOMECHANICALS) ×1 IMPLANT
SCISSORS LAP 5X35 DISP (ENDOMECHANICALS) ×1 IMPLANT
SET CHOLANGIOGRAPH MIX (MISCELLANEOUS) IMPLANT
SET TUBE SMOKE EVAC HIGH FLOW (TUBING) ×1 IMPLANT
SLEEVE ADV FIXATION 5X100MM (TROCAR) ×2 IMPLANT
SUT MNCRL AB 4-0 PS2 18 (SUTURE) ×1 IMPLANT
SUT VIC AB 0 UR5 27 (SUTURE) IMPLANT
SUT VICRYL 0 UR6 27IN ABS (SUTURE) ×1 IMPLANT
SYSTEM BAG RETRIEVAL 10MM (BASKET) IMPLANT
TRAY LAPAROSCOPIC (CUSTOM PROCEDURE TRAY) ×1 IMPLANT
TROCAR ADV FIXATION 5X100MM (TROCAR) ×1 IMPLANT
TROCAR BALLN 12MMX100 BLUNT (TROCAR) ×1 IMPLANT
TROCAR Z-THREAD FIOS 5X100MM (TROCAR) IMPLANT
WATER STERILE IRR 1000ML POUR (IV SOLUTION) ×1 IMPLANT

## 2024-07-12 NOTE — Progress Notes (Signed)
 [facility]                                                                              [name], is a 73 y.o. 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  Patient reported abdominal pain, periumbilical, right upper quadrant, radiating to the back, 10 out of 10 sharp and intermittent with nausea and vomiting.  No hematemesis or any diarrhea. No prior episodes of similar abdominal pain. CT abdomen showed acute cholecystitis with gallbladder distention, wall thickening, surrounding inflammatory stranding and gallstones measuring up to 1.1 cm.  CBD dilation measuring 10 mm without a visible calcified ductal stone Lipase normal. GI, general surgery consulted  Assessment & Plan      Acute cholecystitis - MRCP showed acute cholecystitis, also dilation of extrahepatic duct with several sub-5 mm sized dependent filling defects in the distal CBD compatible with choledocholithiasis - Currently n.p.o., pain control, antiemetics, IV fluids - GI, general surgery consulted. - Surgery planning to do laparoscopic cholecystectomy today, plan for ERCP tomorrow per GI  COPD  -No acute wheezing or shortness of breath -Continue Flonase , Breo Ellipta , albuterol  inhaler as needed     Hypothyroidism -Follow TSH, continue Synthroid      Malignant neoplasm of lung metastatic to brain (HCC) -Non-small cell lung CA, adeno CA, initially diagnosed as stage IIIa in April 2023, underwent neoadjuvant treatment for brain metastasis, right  posterior parietal craniotomy and SRS.  - Outpatient follow-up with Dr. Sherrod   Estimated body mass index is 19.84 kg/m as calculated from the following:   Height as of this encounter: 5' 3 (1.6 m).   Weight as of this encounter: 50.8 kg.  Code Status: Full code DVT Prophylaxis:  SCDs Start: 07/11/24 1617   Level of Care: Level of care: Progressive Family Communication: Updated patient Disposition Plan:      Remains inpatient appropriate:      Procedures:    Consultants:   Gastroenterology General Surgery  Antimicrobials:   Anti-infectives (From admission, onward)    Start     Dose/Rate Route Frequency Ordered Stop   07/11/24 2000  piperacillin-tazobactam (ZOSYN) IVPB 4.5 g  Status:  Discontinued        4.5 g 200 mL/hr over 30 Minutes Intravenous Every 8 hours 07/11/24 1625 07/11/24 1626   07/11/24 2000  [MAR Hold]  piperacillin-tazobactam (ZOSYN) IVPB 3.375 g        (MAR Hold since Mon 07/12/2024 at 1059.Hold Reason: Transfer to a Procedural area)   3.375 g 12.5 mL/hr over 240 Minutes Intravenous Every 8 hours 07/11/24 1626     07/11/24 1215  piperacillin-tazobactam (ZOSYN) IVPB  3.375 g        3.375 g 100 mL/hr over 30 Minutes Intravenous  Once 07/11/24 1207 07/11/24 1302          Medications  [MAR Hold] fluticasone   2 spray Each Nare Daily   [MAR Hold] fluticasone  furoate-vilanterol  1 puff Inhalation Daily   [MAR Hold] levothyroxine   75 mcg Oral Q0600   [MAR Hold] mupirocin  ointment  1 Application Nasal BID      Subjective:   [name] was seen and examined today.  Seen this morning after MRCP, ambulating in the room without any difficulty.  Noted that patient was made n.p.o. and plan for cholecystectomy today.  No acute chest pain, shortness of breath, fever or chills abdominal pain improving.    Objective:   Vitals:   07/11/24 2350 07/11/24 2351 07/12/24 0408 07/12/24 1109  BP: (!) 93/49  (!) 96/56   Pulse: 73  65   Resp: 16  15   Temp: 99  F (37.2 C)  98.9 F (37.2 C)   TempSrc: Oral  Oral   SpO2: (!) 89% 91% 98%   Weight:    50.8 kg  Height:    5' 3 (1.6 m)    Intake/Output Summary (Last 24 hours) at 07/12/2024 1115 Last data filed at 07/12/2024 0606 Gross per 24 hour  Intake 1013.07 ml  Output --  Net 1013.07 ml     Wt Readings from Last 3 Encounters:  07/12/24 50.8 kg  04/19/24 53.5 kg  04/14/24 53.5 kg     Exam General: Alert and oriented x 3, NAD Cardiovascular: S1 S2 auscultated,  RRR Respiratory: Clear to auscultation bilaterally, no wheezing Gastrointestinal: Soft, nontender, nondistended, + bowel sounds Ext: no pedal edema bilaterally Neuro: No new deficits Psych: Normal affect     Data Reviewed:  I have personally reviewed following labs    CBC Lab Results  Component Value Date   WBC 10.7 (H) 07/12/2024   RBC 3.38 (L) 07/12/2024   HGB 10.3 (L) 07/12/2024   HCT 33.6 (L) 07/12/2024   MCV 99.4 07/12/2024   MCH 30.5 07/12/2024   PLT 184 07/12/2024   MCHC 30.7 07/12/2024   RDW 14.0 07/12/2024   LYMPHSABS 1.5 04/12/2024   MONOABS 0.7 04/12/2024   EOSABS 0.1 04/12/2024   BASOSABS 0.1 04/12/2024     Last metabolic panel Lab Results  Component Value Date   NA 139 07/12/2024   K 3.7 07/12/2024   CL 105 07/12/2024   CO2 25 07/12/2024   BUN 12 07/12/2024   CREATININE 0.60 07/12/2024   GLUCOSE 83 07/12/2024   GFRNONAA >60 07/12/2024   GFRAA >60 06/15/2020   CALCIUM  8.3 (L) 07/12/2024   PROT 6.0 (L) 07/12/2024   ALBUMIN  3.5 07/12/2024   LABGLOB 2.5 10/29/2017   AGRATIO 1.6 10/29/2017   BILITOT 1.5 (H) 07/12/2024   ALKPHOS 142 (H) 07/12/2024   AST 133 (H) 07/12/2024   ALT 158 (H) 07/12/2024   ANIONGAP 9 07/12/2024    CBG (last 3)  No results for input(s): GLUCAP in the last 72 hours.    Coagulation Profile: No results for input(s): INR, PROTIME in the last 168 hours.   Radiology Studies: I have personally reviewed the imaging studies  MR ABDOMEN MRCP W WO  CONTAST Result Date: 07/12/2024 CLINICAL DATA:  Cholelithiasis [id number] EXAM: MRI ABDOMEN WITHOUT AND WITH CONTRAST (INCLUDING MRCP) TECHNIQUE: Multiplanar multisequence MR imaging of the abdomen was performed both before and after the administration of intravenous contrast.  Heavily T2-weighted images of the biliary and pancreatic ducts were obtained, and three-dimensional MRCP images were rendered by post processing. CONTRAST:  5mL GADAVIST  GADOBUTROL  1 MMOL/ML IV SOLN COMPARISON:  CT scan renal stone protocol from 07/11/2024. FINDINGS: Lower chest: Unremarkable MR appearance to the lung bases. No pleural effusion. No pericardial effusion. Normal heart size. Hepatobiliary: The liver is normal in size. Noncirrhotic configuration. No suspicious liver lesion. There is a 5 mm subcapsular simple cyst in the right hepatic lobe, segment 6 no intrahepatic bile duct dilation. The extrahepatic bile duct is dilated measuring up to 9 mm in the proximal portion 11 mm in the midportion and it gradually tapers in the distal portion measuring up to 4 mm just before the ampulla of Vater. However, there are several, sub 5 mm sized, dependent, filling defects in the distal CBD, compatible with choledocholithiasis. The gallbladder is distended and exhibit moderate circumferential wall thickening and pericholecystic fat stranding. There are moderate volume dependent gallstones. Findings are compatible with acute cholecystitis. Pancreas: No mass, inflammatory changes or other parenchymal abnormality identified. No main pancreatic duct dilation. Spleen:  Within normal limits in size and appearance. No focal mass. Adrenals/Urinary Tract: Unremarkable adrenal glands. No hydroureteronephrosis on either side. There are multiple bilateral sinus cysts with largest arising from the left kidney interpolar region measuring up to 1.2 x 2.2 cm. No suspicious renal lesion. Stomach/Bowel: Visualized portions within the abdomen are unremarkable. No  disproportionate dilation of bowel loops. Vascular/Lymphatic: No pathologically enlarged lymph nodes identified. No abdominal aortic aneurysm demonstrated. No ascites. Other:  None. Musculoskeletal: No suspicious bone lesions identified. IMPRESSION: 1. Findings compatible with acute cholecystitis. There is also dilation of the extrahepatic bile duct with several, sub 5 mm sized, dependent, filling defects in the distal CBD, compatible with choledocholithiasis. 2. Otherwise unremarkable exam. Electronically Signed   By: Ree Molt M.D.   On: 07/12/2024 09:58   MR 3D Recon At Scanner Result Date: 07/12/2024 CLINICAL DATA:  Cholelithiasis [id number] EXAM: MRI ABDOMEN WITHOUT AND WITH CONTRAST (INCLUDING MRCP) TECHNIQUE: Multiplanar multisequence MR imaging of the abdomen was performed both before and after the administration of intravenous contrast. Heavily T2-weighted images of the biliary and pancreatic ducts were obtained, and three-dimensional MRCP images were rendered by post processing. CONTRAST:  5mL GADAVIST  GADOBUTROL  1 MMOL/ML IV SOLN COMPARISON:  CT scan renal stone protocol from 07/11/2024. FINDINGS: Lower chest: Unremarkable MR appearance to the lung bases. No pleural effusion. No pericardial effusion. Normal heart size. Hepatobiliary: The liver is normal in size. Noncirrhotic configuration. No suspicious liver lesion. There is a 5 mm subcapsular simple cyst in the right hepatic lobe, segment 6 no intrahepatic bile duct dilation. The extrahepatic bile duct is dilated measuring up to 9 mm in the proximal portion 11 mm in the midportion and it gradually tapers in the distal portion measuring up to 4 mm just before the ampulla of Vater. However, there are several, sub 5 mm sized, dependent, filling defects in the distal CBD, compatible with choledocholithiasis. The gallbladder is distended and exhibit moderate circumferential wall thickening and pericholecystic fat stranding. There are moderate volume  dependent gallstones. Findings are compatible with acute cholecystitis. Pancreas: No mass, inflammatory changes or other parenchymal abnormality identified. No main pancreatic duct dilation. Spleen:  Within normal limits in size and appearance. No focal mass. Adrenals/Urinary Tract: Unremarkable adrenal glands. No hydroureteronephrosis on either side. There are multiple bilateral sinus cysts with largest arising from the left kidney interpolar region measuring up to 1.2 x 2.2 cm.  No suspicious renal lesion. Stomach/Bowel: Visualized portions within the abdomen are unremarkable. No disproportionate dilation of bowel loops. Vascular/Lymphatic: No pathologically enlarged lymph nodes identified. No abdominal aortic aneurysm demonstrated. No ascites. Other:  None. Musculoskeletal: No suspicious bone lesions identified. IMPRESSION: 1. Findings compatible with acute cholecystitis. There is also dilation of the extrahepatic bile duct with several, sub 5 mm sized, dependent, filling defects in the distal CBD, compatible with choledocholithiasis. 2. Otherwise unremarkable exam. Electronically Signed   By: Ree Molt M.D.   On: 07/12/2024 09:58   CT Renal Stone Study Result Date: 07/11/2024 EXAM: CT UROGRAM 07/11/2024 11:00:41 AM TECHNIQUE: CT of the abdomen and pelvis was performed without the administration of intravenous contrast as per CT urogram protocol. Multiplanar reformatted images as well as MIP urogram images are provided for review. Automated exposure control, iterative reconstruction, and/or weight based adjustment of the mA/kV was utilized to reduce the radiation dose to as low as reasonably achievable. COMPARISON: 04/12/2024 CLINICAL HISTORY: Abdominal/flank pain, stone suspected. Patient reports abdominal pain starting 2 days ago radiating to back. Patient endorses nausea/vomiting. Patient reports constipation since Friday (24OCT). Patient reports dark urine. Patient denies any history of kidney stones.  FINDINGS: LOWER CHEST: No acute abnormality. LIVER: No focal liver abnormality. GALLBLADDER AND BILE DUCTS: Gallbladder distention with wall thickening and surrounding soft tissue stranding. Stones within the gallbladder measure up to 1.1 cm (image 26/2). Common bile duct measures 9 to 10 mm in diameter (image 51/3). No calcified stone identified along the course of the common bile duct. SPLEEN: No acute abnormality. PANCREAS: No acute abnormality. ADRENAL GLANDS: No acute abnormality. KIDNEYS, URETERS AND BLADDER: No nephrolithiasis or obstructive uropathy. Bladder appears decompressed. No perinephric or periureteral stranding. GI AND BOWEL: Stomach demonstrates no acute abnormality. Moderate retained stool noted within the colon. Scattered colonic diverticula without signs of acute diverticulitis. There is no bowel obstruction. PERITONEUM AND RETROPERITONEUM: No ascites. No free air. A small fat-containing supraumbilical and periumbilical hernia is noted. VASCULATURE: Aorta is normal in caliber. Aortic atherosclerosis. LYMPH NODES: No lymphadenopathy. REPRODUCTIVE ORGANS: No acute abnormality. 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 Curvature of the lumbar spine is convex towards the left. Lumbar degenerative disc disease noted. No acute osseous abnormality. No focal soft tissue abnormality. IMPRESSION: 1. Acute cholecystitis with gallbladder distention, wall thickening, surrounding inflammatory stranding, and gallstones measuring up to 1.1 cm. 2. Common bile duct dilation measuring 910 mm without a visible calcified ductal stone. Electronically signed by: Waddell Calk MD 07/11/2024 11:25 AM EDT RP Workstation: [id number]       Nydia Distance M.D. [facility] 07/12/2024, 11:15 AM  Available via Epic secure chat 7am-7pm After 7 pm, please refer to night coverage provider listed on amion.

## 2024-07-12 NOTE — Progress Notes (Addendum)
 Cholecystitis and hyperbilirubinemia. MRCP+ for choledocholithiasis. ERCP unavailable today. Plan lap chole with IOC and attempt at clearing CBD. If unsuccessful, already scheduled for ERCP 10/28 at 1300. Patient seen and examined. Informed consent was obtained after detailed explanation of risks, including bleeding, infection, biloma, hematoma, injury to common bile duct, and need for conversion to open procedure. All questions answered to the patient's satisfaction.  Dreama GEANNIE Hanger, MD General and Trauma Surgery [facility]

## 2024-07-12 NOTE — H&P (View-Only) (Signed)
 Consultation  Referring Provider:  TRH  Primary Care Physician:  Juliane Che, PA Primary Gastroenterologist:  Dr. Wilhelmenia       Reason for Consultation:   elevated LFTs    LOS: 1 day          HPI:   [name] is a 73 y.o. 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  States this pain started 10/24 and then went away and came back with a vengeance 10/26 that was so painful it resulted in her proceeding to the emergency room.  She also had associated nausea and vomiting.  No fever/chills.  No hematemesis.  No unintentional weight loss.  No previous episodes of pain like this.  Upon arrival in ED she was found to have elevated LFTs with alk phos 169/AST 377/ALT 241/total bilirubin 4.4 as well as associated leukocytosis with WBC 14.2.  CT abdomen pelvis with contrast showed acute cholecystitis with gallbladder distention, wall thickening, surrounding inflammation stranding and gallstones measuring up to 1.1 cm.  CBD dilation 10 mm without a visible ductal stone.   PREVIOUS GI WORKUP   Colonoscopy 2019 with Dr. Wilhelmenia - Three 1 to 2 mm polyps (hyperplastic) in the rectum, removed with a jumbo cold forceps. Resected and retrieved. - Diverticulosis in the recto- sigmoid colon, in the sigmoid colon and in the descending colon. - Normal mucosa in the entire examined colon. - Non- bleeding non- thrombosed external and internal hemorrhoids.  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 Family History:  Her family history includes Cancer in her father; Colon cancer (age of onset: 63) in her mother; Mental illness in her mother. Social History:   reports that she quit smoking about 4 years ago. Her smoking use included cigarettes. She started smoking about 54 years ago. She has a 2.5 pack-year smoking history. She has never used smokeless tobacco. She reports that she does not currently use alcohol after a past usage of about 10.0 standard drinks of alcohol per week. She reports that she does not use drugs.  Prior to Admission medications   Medication Sig Start Date End Date Taking? Authorizing Provider  acetaminophen  (TYLENOL ) 325 MG tablet Take 1-2 tablets (325-650 mg total) by mouth every 4 (four) hours as needed for mild pain. 01/08/22  Yes Love, Sharlet RAMAN, PA-C  albuterol  (PROVENTIL  HFA;VENTOLIN  HFA) 108 (90 Base) MCG/ACT inhaler Inhale 2 puffs into the lungs every 4 (four) hours as needed for wheezing or shortness of breath (cough, shortness of breath or wheezing.). 10/29/17  Yes Jeffery, Chelle, PA  alendronate (FOSAMAX) 70 MG tablet Take 70 mg by mouth once a week. 06/15/21  Yes [provider]  calcium  carbonate (OS-CAL) 600 MG TABS tablet Take 600 mg by mouth daily with breakfast.   Yes [provider]  Cholecalciferol  (VITAMIN D3 PO) Take 2,000 Units by mouth daily.   Yes [provider]  fluticasone  (FLONASE ) 50 MCG/ACT nasal spray Place 2 sprays into both nostrils daily. 01/06/18  Yes Jeffery, Chelle, PA  levothyroxine  (SYNTHROID ) 75 MCG tablet TAKE 1 TABLET BY MOUTH EVERY DAY BEFORE BREAKFAST 01/05/24  Yes Heilingoetter,  Cassandra L, PA-C  Multiple Vitamin (MULTIVITAMIN) tablet Take 1 tablet by mouth daily.   Yes [provider]  polyethylene glycol powder (GLYCOLAX /MIRALAX ) 17 GM/SCOOP powder Take 17 g by mouth daily as needed for mild constipation or moderate constipation.   Yes [provider]  Fluticasone -Salmeterol (ADVAIR) 250-50 MCG/DOSE AEPB Inhale 1 puff into the lungs 2 (two) times daily.  Patient not taking: Reported on 07/11/2024 02/05/18   [provider]    Current Facility-Administered Medications  Medication Dose Route Frequency Provider Last Rate Last Admin   0.9 %  sodium chloride  infusion   Intravenous Continuous Geroldine Berg, MD   Held at 07/12/24 0920   albuterol  (PROVENTIL ) (2.5 MG/3ML) 0.083% nebulizer solution 3 mL  3 mL Inhalation Q6H PRN Rai, Ripudeep K, MD       chlorhexidine  (PERIDEX ) 0.12 % solution 15 mL  15 mL Mouth/Throat Once Leopoldo Bruckner, MD       Or   Oral care mouth rinse  15 mL Mouth Rinse Once Leopoldo Bruckner, MD       fluticasone  (FLONASE ) 50 MCG/ACT nasal spray 2 spray  2 spray Each Nare Daily Rai, Ripudeep K, MD       fluticasone  furoate-vilanterol (BREO ELLIPTA ) 200-25 MCG/ACT 1 puff  1 puff Inhalation Daily Rai, Ripudeep K, MD       HYDROmorphone  (DILAUDID ) injection 0.5-1 mg  0.5-1 mg Intravenous Q2H PRN Rai, Nydia POUR, MD       lactated ringers  infusion   Intravenous Continuous Leopoldo Bruckner, MD       levothyroxine  (SYNTHROID ) tablet 75 mcg  75 mcg Oral Q0600 Rai, Ripudeep K, MD   75 mcg at 07/12/24 0515   ondansetron  (ZOFRAN ) tablet 4 mg  4 mg Oral Q6H PRN Rai, Ripudeep K, MD       Or   ondansetron  (ZOFRAN ) injection 4 mg  4 mg Intravenous Q6H PRN Rai, Ripudeep K, MD       oxyCODONE  (Oxy IR/ROXICODONE ) immediate release tablet 5 mg  5 mg Oral Q4H PRN Rai, Ripudeep K, MD   5 mg at 07/11/24 2309   piperacillin-tazobactam (ZOSYN) IVPB 3.375 g  3.375 g Intravenous Q8H Utomwen, Adesuwa, RPH 12.5 mL/hr at 07/12/24 0515 3.375 g at  07/12/24 0515    Allergies as of 07/11/2024   (No Known Allergies)    Review of Systems  Constitutional: Negative.   HENT: Negative.    Eyes: Negative.   Respiratory: Negative.    Cardiovascular: Negative.   Gastrointestinal:  Positive for abdominal pain, nausea and vomiting.  Genitourinary: Negative.   Musculoskeletal: Negative.   Skin: Negative.   Neurological: Negative.   Psychiatric/Behavioral: Negative.         Physical Exam:  Vital signs in last 24 hours: Temp:  [98.2 F (36.8 C)-99.4 F (37.4 C)] 98.9 F (37.2 C) (10/27 0408) Pulse Rate:  [63-86] 65 (10/27 0408) Resp:  [15-20] 15 (10/27 0408) BP: (86-115)/(46-91) 96/56 (10/27 0408) SpO2:  [89 %-99 %] 98 % (10/27 0408) Weight:  [50.8 kg] 50.8 kg (10/26 1023) Last BM Date : 07/08/24 Last BM recorded by nurses in past 5 days No data recorded  Physical Exam Constitutional:  Appearance: She is well-developed.  HENT:     Head: Normocephalic and atraumatic.     Nose: No congestion.  Eyes:     Extraocular Movements: Extraocular movements intact.     Conjunctiva/sclera: Conjunctivae normal.  Cardiovascular:     Rate and Rhythm: Normal rate and regular rhythm.  Pulmonary:     Effort: Pulmonary effort is normal. No respiratory distress.  Abdominal:     General: Bowel sounds are normal. There is no distension.     Palpations: Abdomen is soft.     Tenderness: There is no abdominal tenderness.  Musculoskeletal:        General: No swelling. Normal range of motion.     Cervical back: Normal range of motion and neck supple.  Skin:    General: Skin is warm and dry.     Coloration: Skin is not jaundiced.  Neurological:     General: No focal deficit present.     Mental Status: She is alert and oriented to person, place, and time.  Psychiatric:        Mood and Affect: Mood normal.        Behavior: Behavior normal.        Thought Content: Thought content normal.        Judgment: Judgment normal.      LAB  RESULTS: Recent Labs    07/11/24 1025 07/12/24 0508  WBC 14.2* 10.7*  HGB 12.3 10.3*  HCT 37.0 33.6*  PLT 225 184   BMET Recent Labs    07/11/24 1025 07/12/24 0508  NA 137 139  K 3.7 3.7  CL 99 105  CO2 26 25  GLUCOSE 110* 83  BUN 10 12  CREATININE 0.82 0.60  CALCIUM  9.8 8.3*   LFT Recent Labs    07/11/24 1025 07/12/24 0508  PROT 7.2 6.0*  ALBUMIN  4.2 3.5  AST 377* 133*  ALT 241* 158*  ALKPHOS 169* 142*  BILITOT 4.4* 1.5*  BILIDIR 2.7*  --   IBILI 1.6*  --    PT/INR No results for input(s): LABPROT, INR in the last 72 hours.  STUDIES: CT Renal Stone Study Result Date: 07/11/2024 EXAM: CT UROGRAM 07/11/2024 11:00:41 AM TECHNIQUE: CT of the abdomen and pelvis was performed without the administration of intravenous contrast as per CT urogram protocol. Multiplanar reformatted images as well as MIP urogram images are provided for review. Automated exposure control, iterative reconstruction, and/or weight based adjustment of the mA/kV was utilized to reduce the radiation dose to as low as reasonably achievable. COMPARISON: 04/12/2024 CLINICAL HISTORY: Abdominal/flank pain, stone suspected. Patient reports abdominal pain starting 2 days ago radiating to back. Patient endorses nausea/vomiting. Patient reports constipation since Friday (24OCT). Patient reports dark urine. Patient denies any history of kidney stones. FINDINGS: LOWER CHEST: No acute abnormality. LIVER: No focal liver abnormality. GALLBLADDER AND BILE DUCTS: Gallbladder distention with wall thickening and surrounding soft tissue stranding. Stones within the gallbladder measure up to 1.1 cm (image 26/2). Common bile duct measures 9 to 10 mm in diameter (image 51/3). No calcified stone identified along the course of the common bile duct. SPLEEN: No acute abnormality. PANCREAS: No acute abnormality. ADRENAL GLANDS: No acute abnormality. KIDNEYS, URETERS AND BLADDER: No nephrolithiasis or obstructive uropathy. Bladder  appears decompressed. No perinephric or periureteral stranding. GI AND BOWEL: Stomach demonstrates no acute abnormality. Moderate retained stool noted within the colon. Scattered colonic diverticula without signs of acute diverticulitis. There is no bowel obstruction. PERITONEUM AND RETROPERITONEUM: No ascites. No free air.  A small fat-containing supraumbilical and periumbilical hernia is noted. VASCULATURE: Aorta is normal in caliber. Aortic atherosclerosis. LYMPH NODES: No lymphadenopathy. REPRODUCTIVE ORGANS: No acute abnormality. 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 Curvature of the lumbar spine is convex towards the left. Lumbar degenerative disc disease noted. No acute osseous abnormality. No focal soft tissue abnormality. IMPRESSION: 1. Acute cholecystitis with gallbladder distention, wall thickening, surrounding inflammatory stranding, and gallstones measuring up to 1.1 cm. 2. Common bile duct dilation measuring 910 mm without a visible calcified ductal stone. Electronically signed by: Waddell Calk MD 07/11/2024 11:25 AM EDT RP Workstation: [id number]      Impression    73 y.o. 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 MRCP pending LFTs improving T bili 4.4 --> 1.1 AST 377/ALT 241/alk phos 169 ---> AST 133/ALT 158/alk phos 142  Metastatic non-small cell lung cancer with brain metastasis  COPD   Plan   - Await MRCP results.  If positive pursue ERCP, if negative general surgery plans to proceed with lap chole with IOC today - Continue to monitor LFTs - Pain control per primary care team  Thank you for  your kind consultation, we will continue to follow.  ADDENDUM 11:19AM MRCP came back positive for choledocholithiasis.  Unfortunately due to limited anesthesia availability we will hold off on ERCP until 07/13/2024.  General surgery will be taking patient for cholecystectomy today 07/12/2024.   Bayley CHRISTELLA Blower  07/12/2024, 9:52 AM    Attending physician's note   I have taken history, reviewed the chart and examined the patient. I performed a substantive portion of this encounter, including complete performance of at least one of the key components, in conjunction with the APP. I agree with the Advanced Practitioner's note, impression and recommendations.   Acute cholecystitis s/p lap chole today with abn IOC (not draining into duodenum, MRCP ++ small stone). No ascending cholangitis or pancreatitis. Metastatic non-small cell lung CA with brain mets Underlying COPD with history of prior tobacco use.  Plan: - Full liquid diet. NPO past MN. - ERCP in AM.  - IOC reviewed.   I have explained risks and benefits  to pt and sister including small but definite risks of pancreatitis (<10%), bleeding (<1%), perforation (<1%). The risks of general anesthesia were also discussed by me and anesthesia staff.  The benefits were also discussed.  Patient wishes to proceed.  All the questions were answered.    Anselm Bring, MD [facility] [phone]

## 2024-07-12 NOTE — TOC Initial Note (Signed)
 Transition of Care [facility]) - Initial/Assessment Note    Patient Details  Name: [name] MRN: [id number] Date of Birth: 1951/07/04  [facility] CM/SW Contact:    Bascom Service, RN Phone Number: 07/12/2024, 11:04 AM  Clinical Narrative: d/c plan home.                  Expected Discharge Plan: Home/Self Care Barriers to Discharge: Continued Medical Work up   Patient Goals and CMS Choice Patient states their goals for this hospitalization and ongoing recovery are:: Home CMS Medicare.gov Compare Post Acute Care list provided to:: Patient Choice offered to / list presented to : Patient [place] ownership interest in [facility].provided to:: Patient    Expected Discharge Plan and Services   Discharge Planning Services: CM Consult   Living arrangements for the past 2 months: Single Family Home                                      Prior Living Arrangements/Services Living arrangements for the past 2 months: Single Family Home Lives with:: Self   Do you feel safe going back to the place where you live?: Yes               Activities of Daily Living   ADL Screening (condition at time of admission) Independently performs ADLs?: Yes (appropriate for developmental age) Is the patient deaf or have difficulty hearing?: No Does the patient have difficulty seeing, even when wearing glasses/contacts?: No Does the patient have difficulty concentrating, remembering, or making decisions?: No  Permission Sought/Granted Permission sought to share information with : Case Manager Permission granted to share information with : Yes, Verbal Permission Granted              Emotional Assessment              Admission diagnosis:  Acute cholecystitis [K81.0] Patient Active Problem List   Diagnosis Date Noted   Acute cholecystitis 07/11/2024   Non-small cell carcinoma of left lung, stage 1 (HCC) 10/09/2023   Leucocytosis 01/11/2022    Constipation 01/11/2022   S/P radiation therapy within last four weeks 01/11/2022   Brain tumor (HCC) 01/11/2022   S/P craniotomy 01/11/2022   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) 01/03/2022   Acute encephalopathy 01/02/2022   Malignant neoplasm of lung metastatic to brain [facility])    Falls 12/30/2021   Hypothyroidism 12/30/2021   Hypocalcemia 12/30/2021   Depression 12/30/2021   Malignant neoplasm metastatic to brain (HCC) 12/30/2021   S/P robot-assisted surgical procedure 03/20/2021   Mass of upper lobe of right lung 10/19/2020   Sore gums 06/29/2020   Encounter for antineoplastic chemotherapy 05/23/2020   Encounter for antineoplastic immunotherapy 05/23/2020   Goals of care, counseling/discussion 05/16/2020   Malignant neoplasm of upper lobe of right lung (HCC) 05/10/2020   Solitary pulmonary nodule on lung CT 03/29/2020   Positive FIT (fecal immunochemical test) 04/15/2018   Osteopenia 02/15/2018   COPD probable gold 0  11/04/2017   Wears partial dentures 09/15/2017   Cigarette smoker 09/15/2017   PCP:  Juliane Che, PA Pharmacy:   [id number] [place], [place] - [address] AT [facility] [address] [place] [address] Phone: [phone] Fax: [phone]     Social Drivers of Health (SDOH) Social History: SDOH Screenings   Food Insecurity: No  Food Insecurity (07/11/2024)  Housing: Unknown (07/11/2024)  Transportation Needs: No Transportation Needs (07/11/2024)  Utilities: Not At Risk (07/11/2024)  Depression (PHQ2-9): Low Risk  (02/27/2022)  Financial Resource Strain: Low Risk  (12/14/2023)   Received from [facility]  Physical Activity: Insufficiently Active (06/13/2024)   Received from [facility]  Social Connections: Moderately Integrated (07/11/2024)  Stress: Stress Concern Present (06/13/2024)   Received from [facility]  Tobacco Use: Medium Risk (07/12/2024)   SDOH Interventions:     Readmission Risk  Interventions     No data to display

## 2024-07-12 NOTE — Transfer of Care (Signed)
 Immediate Anesthesia Transfer of Care Note  Patient: [name]  Procedure(s) Performed: LAPAROSCOPIC CHOLECYSTECTOMY WITH INTRAOPERATIVE CHOLANGIOGRAM  Patient Location: PACU  Anesthesia Type:General  Level of Consciousness: awake  Airway & Oxygen Therapy: Patient Spontanous Breathing  Post-op Assessment: Report given to RN and Post -op Vital signs reviewed and stable  Post vital signs: Reviewed and stable  Last Vitals:  Vitals Value Taken Time  BP 108/50 07/12/24 14:45  Temp    Pulse 86 07/12/24 14:46  Resp 25 07/12/24 14:46  SpO2 93 % 07/12/24 14:46  Vitals shown include unfiled device data.  Last Pain:  Vitals:   07/12/24 1116  TempSrc: Oral  PainSc:       Patients Stated Pain Goal: 4 (07/12/24 1109)  Complications: No notable events documented.

## 2024-07-12 NOTE — Anesthesia Procedure Notes (Signed)
 Procedure Name: Intubation Date/Time: 07/12/2024 1:02 PM  Performed by: Cena Epps, CRNAPre-anesthesia Checklist: Patient identified, Emergency Drugs available, Suction available and Patient being monitored Patient Re-evaluated:Patient Re-evaluated prior to induction Oxygen Delivery Method: Circle System Utilized Preoxygenation: Pre-oxygenation with 100% oxygen Induction Type: IV induction Ventilation: Mask ventilation without difficulty Laryngoscope Size: Mac and 3 Grade View: Grade I Tube type: Oral Tube size: 7.0 mm Number of attempts: 1 Airway Equipment and Method: Stylet and Oral airway Placement Confirmation: ETT inserted through vocal cords under direct vision, positive ETCO2 and breath sounds checked- equal and bilateral Secured at: 21 cm Tube secured with: Tape Dental Injury: Teeth and Oropharynx as per pre-operative assessment

## 2024-07-12 NOTE — Consult Note (Addendum)
 Consultation  Referring Provider:  TRH  Primary Care Physician:  Juliane Che, PA Primary Gastroenterologist:  Dr. Wilhelmenia       Reason for Consultation:   elevated LFTs    LOS: 1 day          HPI:   [name] is a 73 y.o. 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  States this pain started 10/24 and then went away and came back with a vengeance 10/26 that was so painful it resulted in her proceeding to the emergency room.  She also had associated nausea and vomiting.  No fever/chills.  No hematemesis.  No unintentional weight loss.  No previous episodes of pain like this.  Upon arrival in ED she was found to have elevated LFTs with alk phos 169/AST 377/ALT 241/total bilirubin 4.4 as well as associated leukocytosis with WBC 14.2.  CT abdomen pelvis with contrast showed acute cholecystitis with gallbladder distention, wall thickening, surrounding inflammation stranding and gallstones measuring up to 1.1 cm.  CBD dilation 10 mm without a visible ductal stone.   PREVIOUS GI WORKUP   Colonoscopy 2019 with Dr. Wilhelmenia - Three 1 to 2 mm polyps (hyperplastic) in the rectum, removed with a jumbo cold forceps. Resected and retrieved. - Diverticulosis in the recto- sigmoid colon, in the sigmoid colon and in the descending colon. - Normal mucosa in the entire examined colon. - Non- bleeding non- thrombosed external and internal hemorrhoids.  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 Family History:  Her family history includes Cancer in her father; Colon cancer (age of onset: 63) in her mother; Mental illness in her mother. Social History:   reports that she quit smoking about 4 years ago. Her smoking use included cigarettes. She started smoking about 54 years ago. She has a 2.5 pack-year smoking history. She has never used smokeless tobacco. She reports that she does not currently use alcohol after a past usage of about 10.0 standard drinks of alcohol per week. She reports that she does not use drugs.  Prior to Admission medications   Medication Sig Start Date End Date Taking? Authorizing Provider  acetaminophen  (TYLENOL ) 325 MG tablet Take 1-2 tablets (325-650 mg total) by mouth every 4 (four) hours as needed for mild pain. 01/08/22  Yes Love, Sharlet RAMAN, PA-C  albuterol  (PROVENTIL  HFA;VENTOLIN  HFA) 108 (90 Base) MCG/ACT inhaler Inhale 2 puffs into the lungs every 4 (four) hours as needed for wheezing or shortness of breath (cough, shortness of breath or wheezing.). 10/29/17  Yes Jeffery, Chelle, PA  alendronate (FOSAMAX) 70 MG tablet Take 70 mg by mouth once a week. 06/15/21  Yes [provider]  calcium  carbonate (OS-CAL) 600 MG TABS tablet Take 600 mg by mouth daily with breakfast.   Yes [provider]  Cholecalciferol  (VITAMIN D3 PO) Take 2,000 Units by mouth daily.   Yes [provider]  fluticasone  (FLONASE ) 50 MCG/ACT nasal spray Place 2 sprays into both nostrils daily. 01/06/18  Yes Jeffery, Chelle, PA  levothyroxine  (SYNTHROID ) 75 MCG tablet TAKE 1 TABLET BY MOUTH EVERY DAY BEFORE BREAKFAST 01/05/24  Yes Heilingoetter,  Cassandra L, PA-C  Multiple Vitamin (MULTIVITAMIN) tablet Take 1 tablet by mouth daily.   Yes [provider]  polyethylene glycol powder (GLYCOLAX /MIRALAX ) 17 GM/SCOOP powder Take 17 g by mouth daily as needed for mild constipation or moderate constipation.   Yes [provider]  Fluticasone -Salmeterol (ADVAIR) 250-50 MCG/DOSE AEPB Inhale 1 puff into the lungs 2 (two) times daily.  Patient not taking: Reported on 07/11/2024 02/05/18   [provider]    Current Facility-Administered Medications  Medication Dose Route Frequency Provider Last Rate Last Admin   0.9 %  sodium chloride  infusion   Intravenous Continuous Geroldine Berg, MD   Held at 07/12/24 0920   albuterol  (PROVENTIL ) (2.5 MG/3ML) 0.083% nebulizer solution 3 mL  3 mL Inhalation Q6H PRN Rai, Ripudeep K, MD       chlorhexidine  (PERIDEX ) 0.12 % solution 15 mL  15 mL Mouth/Throat Once Leopoldo Bruckner, MD       Or   Oral care mouth rinse  15 mL Mouth Rinse Once Leopoldo Bruckner, MD       fluticasone  (FLONASE ) 50 MCG/ACT nasal spray 2 spray  2 spray Each Nare Daily Rai, Ripudeep K, MD       fluticasone  furoate-vilanterol (BREO ELLIPTA ) 200-25 MCG/ACT 1 puff  1 puff Inhalation Daily Rai, Ripudeep K, MD       HYDROmorphone  (DILAUDID ) injection 0.5-1 mg  0.5-1 mg Intravenous Q2H PRN Rai, Nydia POUR, MD       lactated ringers  infusion   Intravenous Continuous Leopoldo Bruckner, MD       levothyroxine  (SYNTHROID ) tablet 75 mcg  75 mcg Oral Q0600 Rai, Ripudeep K, MD   75 mcg at 07/12/24 0515   ondansetron  (ZOFRAN ) tablet 4 mg  4 mg Oral Q6H PRN Rai, Ripudeep K, MD       Or   ondansetron  (ZOFRAN ) injection 4 mg  4 mg Intravenous Q6H PRN Rai, Ripudeep K, MD       oxyCODONE  (Oxy IR/ROXICODONE ) immediate release tablet 5 mg  5 mg Oral Q4H PRN Rai, Ripudeep K, MD   5 mg at 07/11/24 2309   piperacillin-tazobactam (ZOSYN) IVPB 3.375 g  3.375 g Intravenous Q8H Utomwen, Adesuwa, RPH 12.5 mL/hr at 07/12/24 0515 3.375 g at  07/12/24 0515    Allergies as of 07/11/2024   (No Known Allergies)    Review of Systems  Constitutional: Negative.   HENT: Negative.    Eyes: Negative.   Respiratory: Negative.    Cardiovascular: Negative.   Gastrointestinal:  Positive for abdominal pain, nausea and vomiting.  Genitourinary: Negative.   Musculoskeletal: Negative.   Skin: Negative.   Neurological: Negative.   Psychiatric/Behavioral: Negative.         Physical Exam:  Vital signs in last 24 hours: Temp:  [98.2 F (36.8 C)-99.4 F (37.4 C)] 98.9 F (37.2 C) (10/27 0408) Pulse Rate:  [63-86] 65 (10/27 0408) Resp:  [15-20] 15 (10/27 0408) BP: (86-115)/(46-91) 96/56 (10/27 0408) SpO2:  [89 %-99 %] 98 % (10/27 0408) Weight:  [50.8 kg] 50.8 kg (10/26 1023) Last BM Date : 07/08/24 Last BM recorded by nurses in past 5 days No data recorded  Physical Exam Constitutional:  Appearance: She is well-developed.  HENT:     Head: Normocephalic and atraumatic.     Nose: No congestion.  Eyes:     Extraocular Movements: Extraocular movements intact.     Conjunctiva/sclera: Conjunctivae normal.  Cardiovascular:     Rate and Rhythm: Normal rate and regular rhythm.  Pulmonary:     Effort: Pulmonary effort is normal. No respiratory distress.  Abdominal:     General: Bowel sounds are normal. There is no distension.     Palpations: Abdomen is soft.     Tenderness: There is no abdominal tenderness.  Musculoskeletal:        General: No swelling. Normal range of motion.     Cervical back: Normal range of motion and neck supple.  Skin:    General: Skin is warm and dry.     Coloration: Skin is not jaundiced.  Neurological:     General: No focal deficit present.     Mental Status: She is alert and oriented to person, place, and time.  Psychiatric:        Mood and Affect: Mood normal.        Behavior: Behavior normal.        Thought Content: Thought content normal.        Judgment: Judgment normal.      LAB  RESULTS: Recent Labs    07/11/24 1025 07/12/24 0508  WBC 14.2* 10.7*  HGB 12.3 10.3*  HCT 37.0 33.6*  PLT 225 184   BMET Recent Labs    07/11/24 1025 07/12/24 0508  NA 137 139  K 3.7 3.7  CL 99 105  CO2 26 25  GLUCOSE 110* 83  BUN 10 12  CREATININE 0.82 0.60  CALCIUM  9.8 8.3*   LFT Recent Labs    07/11/24 1025 07/12/24 0508  PROT 7.2 6.0*  ALBUMIN  4.2 3.5  AST 377* 133*  ALT 241* 158*  ALKPHOS 169* 142*  BILITOT 4.4* 1.5*  BILIDIR 2.7*  --   IBILI 1.6*  --    PT/INR No results for input(s): LABPROT, INR in the last 72 hours.  STUDIES: CT Renal Stone Study Result Date: 07/11/2024 EXAM: CT UROGRAM 07/11/2024 11:00:41 AM TECHNIQUE: CT of the abdomen and pelvis was performed without the administration of intravenous contrast as per CT urogram protocol. Multiplanar reformatted images as well as MIP urogram images are provided for review. Automated exposure control, iterative reconstruction, and/or weight based adjustment of the mA/kV was utilized to reduce the radiation dose to as low as reasonably achievable. COMPARISON: 04/12/2024 CLINICAL HISTORY: Abdominal/flank pain, stone suspected. Patient reports abdominal pain starting 2 days ago radiating to back. Patient endorses nausea/vomiting. Patient reports constipation since Friday (24OCT). Patient reports dark urine. Patient denies any history of kidney stones. FINDINGS: LOWER CHEST: No acute abnormality. LIVER: No focal liver abnormality. GALLBLADDER AND BILE DUCTS: Gallbladder distention with wall thickening and surrounding soft tissue stranding. Stones within the gallbladder measure up to 1.1 cm (image 26/2). Common bile duct measures 9 to 10 mm in diameter (image 51/3). No calcified stone identified along the course of the common bile duct. SPLEEN: No acute abnormality. PANCREAS: No acute abnormality. ADRENAL GLANDS: No acute abnormality. KIDNEYS, URETERS AND BLADDER: No nephrolithiasis or obstructive uropathy. Bladder  appears decompressed. No perinephric or periureteral stranding. GI AND BOWEL: Stomach demonstrates no acute abnormality. Moderate retained stool noted within the colon. Scattered colonic diverticula without signs of acute diverticulitis. There is no bowel obstruction. PERITONEUM AND RETROPERITONEUM: No ascites. No free air.  A small fat-containing supraumbilical and periumbilical hernia is noted. VASCULATURE: Aorta is normal in caliber. Aortic atherosclerosis. LYMPH NODES: No lymphadenopathy. REPRODUCTIVE ORGANS: No acute abnormality. 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 Curvature of the lumbar spine is convex towards the left. Lumbar degenerative disc disease noted. No acute osseous abnormality. No focal soft tissue abnormality. IMPRESSION: 1. Acute cholecystitis with gallbladder distention, wall thickening, surrounding inflammatory stranding, and gallstones measuring up to 1.1 cm. 2. Common bile duct dilation measuring 910 mm without a visible calcified ductal stone. Electronically signed by: Waddell Calk MD 07/11/2024 11:25 AM EDT RP Workstation: [id number]      Impression    73 y.o. 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 MRCP pending LFTs improving T bili 4.4 --> 1.1 AST 377/ALT 241/alk phos 169 ---> AST 133/ALT 158/alk phos 142  Metastatic non-small cell lung cancer with brain metastasis  COPD   Plan   - Await MRCP results.  If positive pursue ERCP, if negative general surgery plans to proceed with lap chole with IOC today - Continue to monitor LFTs - Pain control per primary care team  Thank you for  your kind consultation, we will continue to follow.  ADDENDUM 11:19AM MRCP came back positive for choledocholithiasis.  Unfortunately due to limited anesthesia availability we will hold off on ERCP until 07/13/2024.  General surgery will be taking patient for cholecystectomy today 07/12/2024.   Bayley CHRISTELLA Blower  07/12/2024, 9:52 AM    Attending physician's note   I have taken history, reviewed the chart and examined the patient. I performed a substantive portion of this encounter, including complete performance of at least one of the key components, in conjunction with the APP. I agree with the Advanced Practitioner's note, impression and recommendations.   Acute cholecystitis s/p lap chole today with abn IOC (not draining into duodenum, MRCP ++ small stone). No ascending cholangitis or pancreatitis. Metastatic non-small cell lung CA with brain mets Underlying COPD with history of prior tobacco use.  Plan: - Full liquid diet. NPO past MN. - ERCP in AM.  - IOC reviewed.   I have explained risks and benefits  to pt and sister including small but definite risks of pancreatitis (<10%), bleeding (<1%), perforation (<1%). The risks of general anesthesia were also discussed by me and anesthesia staff.  The benefits were also discussed.  Patient wishes to proceed.  All the questions were answered.    Anselm Bring, MD [facility] [phone]

## 2024-07-12 NOTE — Anesthesia Preprocedure Evaluation (Signed)
 Anesthesia Evaluation  Patient identified by MRN, date of birth, ID band Patient awake    Reviewed: Allergy & Precautions, NPO status , Patient's Chart, lab work & pertinent test results  History of Anesthesia Complications Negative for: history of anesthetic complications  Airway Mallampati: I  TM Distance: >3 FB Neck ROM: Full    Dental  (+) Dental Advisory Given, Edentulous Upper   Pulmonary neg shortness of breath, neg sleep apnea, COPD,  COPD inhaler, neg recent URI, former smoker   breath sounds clear to auscultation       Cardiovascular negative cardio ROS  Rhythm:Regular     Neuro/Psych  PSYCHIATRIC DISORDERS  Depression       GI/Hepatic negative GI ROS,,,Lab Results      Component                Value               Date                      WBC                      10.7 (H)            07/12/2024                HGB                      10.3 (L)            07/12/2024                HCT                      33.6 (L)            07/12/2024                MCV                      99.4                07/12/2024                PLT                      184                 07/12/2024              Endo/Other  Hypothyroidism    Renal/GU Lab Results      Component                Value               Date                      NA                       139                 07/12/2024                K                        3.7  07/12/2024                CO2                      25                  07/12/2024                GLUCOSE                  83                  07/12/2024                BUN                      12                  07/12/2024                CREATININE               0.60                07/12/2024                CALCIUM                   8.3 (L)             07/12/2024                GFRNONAA                 >60                 07/12/2024                Musculoskeletal negative musculoskeletal  ROS (+)    Abdominal   Peds  Hematology  (+) Blood dyscrasia, anemia Lab Results      Component                Value               Date                      WBC                      10.7 (H)            07/12/2024                HGB                      10.3 (L)            07/12/2024                HCT                      33.6 (L)            07/12/2024                MCV                      99.4                07/12/2024  PLT                      184                 07/12/2024              Anesthesia Other Findings   Reproductive/Obstetrics                              Anesthesia Physical Anesthesia Plan  ASA: 2  Anesthesia Plan: General   Post-op Pain Management: Ofirmev  IV (intra-op)*   Induction: Intravenous  PONV Risk Score and Plan: 4 or greater and Ondansetron  and Dexamethasone   Airway Management Planned: Oral ETT  Additional Equipment: None  Intra-op Plan:   Post-operative Plan: Extubation in OR  Informed Consent: I have reviewed the patients History and Physical, chart, labs and discussed the procedure including the risks, benefits and alternatives for the proposed anesthesia with the patient or authorized representative who has indicated his/her understanding and acceptance.     Dental advisory given  Plan Discussed with: CRNA  Anesthesia Plan Comments:          Anesthesia Quick Evaluation

## 2024-07-12 NOTE — Op Note (Addendum)
 Operative Note  Date: 07/12/2024  Procedure: laparoscopic cholecystectomy with intra-operative cholangiogram  Pre-op diagnosis: acute cholecystitis, hyperbilirubinemia, choledocholithiasis Post-op diagnosis: same  Indication and clinical history: The patient is a 73 y.o. year old female with acute cholecystitis, hyperbilirubinemia, choledocholithiasis  Surgeon: Dreama GEANNIE Hanger, MD  Anesthesiologist: Leopoldo, MD Anesthesia: General  Findings:  Specimen: gallbladder EBL: 10cc Drains/Implants: none  Disposition: PACU - hemodynamically stable.  Description of procedure: The patient was positioned supine on the operating room table. Time-out was performed verifying correct patient, procedure, signature of informed consent, and administration of pre-operative antibiotics. General anesthetic induction and intubation were uneventful. The abdomen was prepped and draped in the usual sterile fashion. An infra-umbilical incision was made using an open technique using zero vicryl stay sutures on either side of the fascia and a 10mm Hassan port inserted. After establishing pneumoperitoneum, which the patient tolerated well, the abdominal cavity was inspected and no injury of any intra-abdominal structures was identified. Additional ports were placed under direct visualization and using local anesthetic: two 5mm ports in the right subcostal region and a 5mm port in the epigastric region. The patient was re-positioned to reverse Trendelenburg and right side up. Adhesiolysis was performed to expose the gallbladder, which was then retracted cephalad. The infundibulum was identified and retracted toward the right lower quadrant. The peritoneum was incised over the infundibulum and the triangle of Calot dissected to expose the critical view of safety. With clear identification and isolation of the cystic duct and cystic artery, the cystic artery was doubly clipped and divided. After this, the cystic duct was  identified as a single structure entering the gallbladder. A clip was placed on the cystic duct close to the neck of the gallbladder. A nick was made in the cystic duct and a cholangiogram catheter threaded. A cholangiogram was obtained and showed no flow of bile into the duodenum and an intact biliary tree. Glucagon was administered and the cholangiogram catheter was repeatedly flushed. Cholangiogram was repeated and was unchanged with no flow of bile into the duodenum and an intact biliary tree. The cholangiogram cathter was removed and the cystic duct was doubly clipped and divided. The gallbladder was dissected off the liver bed using electrocautery and hemostasis of the liver bed was confirmed prior to separation of the final peritoneal attachments of the gallbladder to the liver bed. The gallbladder fossa was irrigated and fluid returned clear. After transection of the final peritoneal attachments, the gallbladder was placed in an endoscopic specimen retrieval bag, removed via the umbilical port site, and sent to pathology as a permanent specimen. The gallbladder fossa was inspected confirming hemostasis, the absence of bile leakage from the cystic duct stump, and correct placement of clips on the cystic artery and cystic duct stumps. The abdomen was desufflated and the fascia of the umbilical port site was closed using the previously placed stay sutures. Additional local anesthetic was administered at the umbilical port site.  The skin of all incisions was closed with 4-0 monocryl. Sterile dressings were applied. All sponge and instrument counts were correct at the conclusion of the procedure. The patient was awakened from anesthesia, extubated uneventfully, and transported to the PACU - hemodynamically stable.. There were no complications.    Upon entering the abdomen (organ space), I encountered infection of the gallbladder.  CASE DATA:  Type of patient?: LDOW CASE (Surgical Hospitalist [facility]  Inpatient)  Status of Case? URGENT Add On  Infection Present At Time Of Surgery (PATOS)?  INFECTION of the galbladder  Dreama GEANNIE Hanger, MD General and Trauma Surgery [facility]

## 2024-07-12 NOTE — Discharge Instructions (Signed)

## 2024-07-12 NOTE — Progress Notes (Signed)
 Progress Note: General Surgery Service   Chief Complaint/Subjective: Feeling better.  MRI canceled yesterday due to some fluids running, plans for MRI this morning  Objective: Vital signs in last 24 hours: Temp:  [98.2 F (36.8 C)-99.4 F (37.4 C)] 98.9 F (37.2 C) (10/27 0408) Pulse Rate:  [63-86] 65 (10/27 0408) Resp:  [15-20] 15 (10/27 0408) BP: (86-115)/(46-91) 96/56 (10/27 0408) SpO2:  [89 %-99 %] 98 % (10/27 0408) Weight:  [50.8 kg] 50.8 kg (10/26 1023) Last BM Date : 07/08/24  Intake/Output from previous day: 10/26 0701 - 10/27 0700 In: 1013.1 [I.V.:414.1; IV Piggyback:599] Out: -  Intake/Output this shift: No intake/output data recorded.  Constitutional: NAD; conversant; no deformities Eyes: Moist conjunctiva; no lid lag; anicteric; PERRL Neck: Trachea midline; no thyromegaly Lungs: Normal respiratory effort; no tactile fremitus CV: RRR; no palpable thrills; no pitting edema GI: Abd Soft, nontender; no palpable hepatosplenomegaly MSK: Normal range of motion of extremities; no clubbing/cyanosis Psychiatric: Appropriate affect; alert and oriented x3 Lymphatic: No palpable cervical or axillary lymphadenopathy  Lab Results: CBC  Recent Labs    07/11/24 1025 07/12/24 0508  WBC 14.2* 10.7*  HGB 12.3 10.3*  HCT 37.0 33.6*  PLT 225 184   BMET Recent Labs    07/11/24 1025 07/12/24 0508  NA 137 139  K 3.7 3.7  CL 99 105  CO2 26 25  GLUCOSE 110* 83  BUN 10 12  CREATININE 0.82 0.60  CALCIUM  9.8 8.3*   PT/INR No results for input(s): LABPROT, INR in the last 72 hours. ABG No results for input(s): PHART, HCO3 in the last 72 hours.  Invalid input(s): PCO2, PO2  Anti-infectives: Anti-infectives (From admission, onward)    Start     Dose/Rate Route Frequency Ordered Stop   07/11/24 2000  piperacillin-tazobactam (ZOSYN) IVPB 4.5 g  Status:  Discontinued        4.5 g 200 mL/hr over 30 Minutes Intravenous Every 8 hours 07/11/24 1625 07/11/24  1626   07/11/24 2000  piperacillin-tazobactam (ZOSYN) IVPB 3.375 g        3.375 g 12.5 mL/hr over 240 Minutes Intravenous Every 8 hours 07/11/24 1626     07/11/24 1215  piperacillin-tazobactam (ZOSYN) IVPB 3.375 g        3.375 g 100 mL/hr over 30 Minutes Intravenous  Once 07/11/24 1207 07/11/24 1302       Medications: Scheduled Meds:  fluticasone   2 spray Each Nare Daily   fluticasone  furoate-vilanterol  1 puff Inhalation Daily   levothyroxine   75 mcg Oral Q0600   Continuous Infusions:  sodium chloride  Stopped (07/12/24 0920)   piperacillin-tazobactam (ZOSYN)  IV 3.375 g (07/12/24 0515)   PRN Meds:.albuterol , gadobutrol , HYDROmorphone  (DILAUDID ) injection, ondansetron  **OR** ondansetron  (ZOFRAN ) IV, oxyCODONE   Assessment/Plan: s/p Procedure(s): LAPAROSCOPIC CHOLECYSTECTOMY WITH INTRAOPERATIVE CHOLANGIOGRAM 07/12/2024  MRCP pending If negative proceed with lap chole If positive call GI for ERCP first    LOS: 1 day    Deward JINNY Foy, MD  [facility] Use [email or web address] to contact on call provider  Daily Billing: [id number] - Straightforward / Low MDM

## 2024-07-13 ENCOUNTER — Inpatient Hospital Stay (HOSPITAL_COMMUNITY): Payer: Self-pay | Admitting: Certified Registered Nurse Anesthetist

## 2024-07-13 ENCOUNTER — Encounter (HOSPITAL_COMMUNITY): Admission: EM | Disposition: A | Payer: Self-pay | Source: Home / Self Care | Attending: Internal Medicine

## 2024-07-13 ENCOUNTER — Encounter (HOSPITAL_COMMUNITY): Payer: Self-pay | Admitting: Surgery

## 2024-07-13 ENCOUNTER — Inpatient Hospital Stay (HOSPITAL_COMMUNITY)

## 2024-07-13 ENCOUNTER — Encounter (HOSPITAL_COMMUNITY): Payer: Self-pay | Admitting: Certified Registered Nurse Anesthetist

## 2024-07-13 DIAGNOSIS — K8050 Calculus of bile duct without cholangitis or cholecystitis without obstruction: Secondary | ICD-10-CM

## 2024-07-13 DIAGNOSIS — K810 Acute cholecystitis: Secondary | ICD-10-CM | POA: Diagnosis not present

## 2024-07-13 DIAGNOSIS — K269 Duodenal ulcer, unspecified as acute or chronic, without hemorrhage or perforation: Secondary | ICD-10-CM

## 2024-07-13 DIAGNOSIS — J449 Chronic obstructive pulmonary disease, unspecified: Secondary | ICD-10-CM | POA: Diagnosis not present

## 2024-07-13 DIAGNOSIS — E039 Hypothyroidism, unspecified: Secondary | ICD-10-CM | POA: Diagnosis not present

## 2024-07-13 DIAGNOSIS — K838 Other specified diseases of biliary tract: Secondary | ICD-10-CM

## 2024-07-13 DIAGNOSIS — K315 Obstruction of duodenum: Secondary | ICD-10-CM | POA: Diagnosis not present

## 2024-07-13 DIAGNOSIS — K3189 Other diseases of stomach and duodenum: Secondary | ICD-10-CM

## 2024-07-13 HISTORY — PX: ERCP: SHX5425

## 2024-07-13 LAB — CBC
HCT: 33 % — ABNORMAL LOW (ref 36.0–46.0)
Hemoglobin: 9.8 g/dL — ABNORMAL LOW (ref 12.0–15.0)
MCH: 30.1 pg (ref 26.0–34.0)
MCHC: 29.7 g/dL — ABNORMAL LOW (ref 30.0–36.0)
MCV: 101.2 fL — ABNORMAL HIGH (ref 80.0–100.0)
Platelets: 194 K/uL (ref 150–400)
RBC: 3.26 MIL/uL — ABNORMAL LOW (ref 3.87–5.11)
RDW: 13.7 % (ref 11.5–15.5)
WBC: 10.5 K/uL (ref 4.0–10.5)
nRBC: 0 % (ref 0.0–0.2)

## 2024-07-13 LAB — COMPREHENSIVE METABOLIC PANEL WITH GFR
ALT: 140 U/L — ABNORMAL HIGH (ref 0–44)
AST: 116 U/L — ABNORMAL HIGH (ref 15–41)
Albumin: 3.1 g/dL — ABNORMAL LOW (ref 3.5–5.0)
Alkaline Phosphatase: 132 U/L — ABNORMAL HIGH (ref 38–126)
Anion gap: 7 (ref 5–15)
BUN: 9 mg/dL (ref 8–23)
CO2: 25 mmol/L (ref 22–32)
Calcium: 8 mg/dL — ABNORMAL LOW (ref 8.9–10.3)
Chloride: 108 mmol/L (ref 98–111)
Creatinine, Ser: 0.47 mg/dL (ref 0.44–1.00)
GFR, Estimated: >60 mL/min (ref >60)
Glucose, Bld: 92 mg/dL (ref 70–99)
Potassium: 3.7 mmol/L (ref 3.5–5.1)
Sodium: 140 mmol/L (ref 135–145)
Total Bilirubin: 0.7 mg/dL (ref 0.0–1.2)
Total Protein: 5.6 g/dL — ABNORMAL LOW (ref 6.5–8.1)

## 2024-07-13 LAB — SURGICAL PATHOLOGY

## 2024-07-13 LAB — LIPASE, BLOOD: Lipase: 18 U/L (ref 11–51)

## 2024-07-13 SURGERY — ERCP, WITH INTERVENTION IF INDICATED
Anesthesia: General

## 2024-07-13 MED ORDER — IBUPROFEN 600 MG PO TABS: 600.0000 mg | ORAL_TABLET | Freq: Four times a day (QID) | ORAL | 1 refills | Status: AC

## 2024-07-13 MED ORDER — ACETAMINOPHEN 500 MG PO TABS: 1000.0000 mg | ORAL_TABLET | Freq: Four times a day (QID) | ORAL | 3 refills | Status: AC

## 2024-07-13 MED ORDER — LACTATED RINGERS IV SOLN: INTRAVENOUS | Status: DC | PRN

## 2024-07-13 MED ORDER — PROPOFOL 500 MG/50ML IV EMUL
INTRAVENOUS | Status: DC | PRN
  Administered 2024-07-13: 70 mg via INTRAVENOUS

## 2024-07-13 MED ORDER — SUGAMMADEX SODIUM 200 MG/2ML IV SOLN
INTRAVENOUS | Status: DC | PRN
  Administered 2024-07-13: 200 mg via INTRAVENOUS

## 2024-07-13 MED ORDER — FENTANYL CITRATE (PF) 100 MCG/2ML IJ SOLN
INTRAMUSCULAR | Status: AC
  Filled 2024-07-13: qty 2

## 2024-07-13 MED ORDER — METHOCARBAMOL 750 MG PO TABS: 750.0000 mg | ORAL_TABLET | Freq: Four times a day (QID) | ORAL | 1 refills | Status: AC

## 2024-07-13 MED ORDER — POLYETHYLENE GLYCOL 3350 17 GM/SCOOP PO POWD: 17.0000 g | Freq: Every day | ORAL | 1 refills | Status: AC

## 2024-07-13 MED ORDER — TRAMADOL HCL 50 MG PO TABS: 50.0000 mg | ORAL_TABLET | ORAL | 0 refills | Status: AC | PRN

## 2024-07-13 MED ORDER — POLYETHYLENE GLYCOL 3350 17 G PO PACK: 17.0000 g | PACK | Freq: Every day | ORAL | 0 refills | Status: AC

## 2024-07-13 MED ORDER — INDOMETHACIN 50 MG RE SUPP
RECTAL | Status: AC
  Filled 2024-07-13: qty 2

## 2024-07-13 MED ORDER — GLUCAGON HCL RDNA (DIAGNOSTIC) 1 MG IJ SOLR
INTRAMUSCULAR | Status: DC | PRN
  Administered 2024-07-13: .25 mg via INTRAVENOUS

## 2024-07-13 MED ORDER — PROPOFOL 10 MG/ML IV BOLUS
INTRAVENOUS | Status: AC
  Filled 2024-07-13: qty 20

## 2024-07-13 MED ORDER — FENTANYL CITRATE (PF) 100 MCG/2ML IJ SOLN
INTRAMUSCULAR | Status: DC | PRN
  Administered 2024-07-13: 50 ug via INTRAVENOUS

## 2024-07-13 MED ORDER — LIDOCAINE HCL (PF) 2 % IJ SOLN
INTRAMUSCULAR | Status: DC | PRN
  Administered 2024-07-13: 50 mg via INTRADERMAL

## 2024-07-13 MED ORDER — GLUCAGON HCL RDNA (DIAGNOSTIC) 1 MG IJ SOLR
INTRAMUSCULAR | Status: AC
  Filled 2024-07-13: qty 2

## 2024-07-13 MED ORDER — PHENYLEPHRINE HCL (PRESSORS) 10 MG/ML IV SOLN
INTRAVENOUS | Status: DC | PRN
Start: 2024-07-13 — End: 2024-07-13
  Administered 2024-07-13 (×2): 80 ug via INTRAVENOUS

## 2024-07-13 MED ORDER — CIPROFLOXACIN IN D5W 400 MG/200ML IV SOLN
INTRAVENOUS | Status: AC
  Filled 2024-07-13: qty 200

## 2024-07-13 MED ORDER — PANTOPRAZOLE SODIUM 40 MG PO TBEC
40.0000 mg | DELAYED_RELEASE_TABLET | Freq: Two times a day (BID) | ORAL | Status: DC
  Administered 2024-07-13 – 2024-07-14 (×3): 40 mg via ORAL
  Filled 2024-07-13 (×3): qty 1

## 2024-07-13 MED ORDER — DEXAMETHASONE SOD PHOSPHATE PF 10 MG/ML IJ SOLN
INTRAMUSCULAR | Status: DC | PRN
  Administered 2024-07-13: 10 mg via INTRAVENOUS

## 2024-07-13 MED ORDER — ROCURONIUM BROMIDE 10 MG/ML (PF) SYRINGE
PREFILLED_SYRINGE | INTRAVENOUS | Status: DC | PRN
  Administered 2024-07-13: 30 mg via INTRAVENOUS

## 2024-07-13 MED ORDER — EPHEDRINE SULFATE (PRESSORS) 25 MG/5ML IV SOSY
PREFILLED_SYRINGE | INTRAVENOUS | Status: DC | PRN
Start: 2024-07-13 — End: 2024-07-13
  Administered 2024-07-13: 7.5 mg via INTRAVENOUS

## 2024-07-13 NOTE — Anesthesia Preprocedure Evaluation (Signed)
 Anesthesia Evaluation  Patient identified by MRN, date of birth, ID band Patient awake    Reviewed: Allergy & Precautions, NPO status , Patient's Chart, lab work & pertinent test results  History of Anesthesia Complications Negative for: history of anesthetic complications  Airway Mallampati: II  TM Distance: >3 FB Neck ROM: Full    Dental  (+) Dental Advisory Given, Edentulous Upper, Missing   Pulmonary neg shortness of breath, neg sleep apnea, COPD,  COPD inhaler, neg recent URI, former smoker   Pulmonary exam normal breath sounds clear to auscultation       Cardiovascular negative cardio ROS Normal cardiovascular exam Rhythm:Regular Rate:Normal     Neuro/Psych  PSYCHIATRIC DISORDERS  Depression       GI/Hepatic negative GI ROS,,,Lab Results      Component                Value               Date                      WBC                      10.7 (H)            07/12/2024                HGB                      10.3 (L)            07/12/2024                HCT                      33.6 (L)            07/12/2024                MCV                      99.4                07/12/2024                PLT                      184                 07/12/2024              Endo/Other  Hypothyroidism    Renal/GU Lab Results      Component                Value               Date                      NA                       139                 07/12/2024                K                        3.7  07/12/2024                CO2                      25                  07/12/2024                GLUCOSE                  83                  07/12/2024                BUN                      12                  07/12/2024                CREATININE               0.60                07/12/2024                CALCIUM                   8.3 (L)             07/12/2024                GFRNONAA                 >60                  07/12/2024                Musculoskeletal negative musculoskeletal ROS (+)    Abdominal   Peds  Hematology  (+) Blood dyscrasia, anemia Lab Results      Component                Value               Date                      WBC                      10.7 (H)            07/12/2024                HGB                      10.3 (L)            07/12/2024                HCT                      33.6 (L)            07/12/2024                MCV                      99.4                07/12/2024  PLT                      184                 07/12/2024              Anesthesia Other Findings   Reproductive/Obstetrics                              Anesthesia Physical Anesthesia Plan  ASA: 2  Anesthesia Plan: General   Post-op Pain Management: Ofirmev  IV (intra-op)*   Induction: Intravenous  PONV Risk Score and Plan: 4 or greater and Ondansetron , Dexamethasone  and Treatment may vary due to age or medical condition  Airway Management Planned: Oral ETT  Additional Equipment: None  Intra-op Plan:   Post-operative Plan: Extubation in OR  Informed Consent: I have reviewed the patients History and Physical, chart, labs and discussed the procedure including the risks, benefits and alternatives for the proposed anesthesia with the patient or authorized representative who has indicated his/her understanding and acceptance.     Dental advisory given  Plan Discussed with: CRNA  Anesthesia Plan Comments:          Anesthesia Quick Evaluation

## 2024-07-13 NOTE — Progress Notes (Signed)
 [facility]                                                                              [name], is a 73 y.o. 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  Patient reported abdominal pain, periumbilical, right upper quadrant, radiating to the back, 10 out of 10 sharp and intermittent with nausea and vomiting.  No hematemesis or any diarrhea. No prior episodes of similar abdominal pain. CT abdomen showed acute cholecystitis with gallbladder distention, wall thickening, surrounding inflammatory stranding and gallstones measuring up to 1.1 cm.  CBD dilation measuring 10 mm without a visible calcified ductal stone Lipase normal. 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  - Outpatient follow-up with  Dr. Sherrod   Estimated body mass index is 19.84 kg/m as calculated from the following:   Height as of this encounter: 5' 3 (1.6 m).   Weight as of this encounter: 50.8 kg.  Code Status: Full code DVT Prophylaxis:  SCDs Start: 07/11/24 1617   Level of Care: Level of care: Progressive Family Communication: Updated patient Disposition Plan:      Remains inpatient appropriate: ERCP today   Procedures:  Laparoscopic cholecystectomy 10/27  Consultants:   Gastroenterology General Surgery  Antimicrobials:   Anti-infectives (From admission, onward)    Start     Dose/Rate Route Frequency Ordered Stop   07/13/24 1200  [MAR Hold]  ciprofloxacin (CIPRO) IVPB 400 mg        (MAR Hold since Tue 07/13/2024 at 1250.Hold Reason: Transfer to a Procedural area)   400 mg 200 mL/hr over 60 Minutes Intravenous  Once 07/12/24 1826     07/13/24 0000  [MAR Hold]  piperacillin-tazobactam (ZOSYN) IVPB 3.375 g        (MAR Hold since Tue 07/13/2024 at 1250.Hold Reason: Transfer to a Procedural area)   3.375 g 12.5 mL/hr over 240 Minutes Intravenous Every 8 hours 07/12/24 2236  07/11/24 2000  piperacillin-tazobactam (ZOSYN) IVPB 4.5 g  Status:  Discontinued        4.5 g 200 mL/hr over 30 Minutes Intravenous Every 8 hours 07/11/24 1625 07/11/24 1626   07/11/24 2000  piperacillin-tazobactam (ZOSYN) IVPB 3.375 g  Status:  Discontinued        3.375 g 12.5 mL/hr over 240 Minutes Intravenous Every 8 hours 07/11/24 1626 07/12/24 2236   07/11/24 1215  piperacillin-tazobactam (ZOSYN) IVPB 3.375 g        3.375 g 100 mL/hr over 30 Minutes Intravenous  Once 07/11/24 1207 07/11/24 1302          Medications  [MAR Hold] fluticasone   2 spray Each Nare Daily   [MAR Hold] fluticasone  furoate-vilanterol  1 puff Inhalation Daily   [MAR Hold] levothyroxine   75 mcg Oral Q0600      Subjective:   [name] was seen and examined today.  Seen this morning, no acute complaints, awaiting ERCP today.  No  acute chest pain, shortness of breath, nausea vomiting or abdominal pain.  Objective:   Vitals:   07/13/24 0554 07/13/24 0609 07/13/24 0616 07/13/24 1253  BP: (!) 101/49  (!) 103/55 (!) 101/40  Pulse: 61  61 63  Resp: 18 20 18 17   Temp: 98.1 F (36.7 C)   (!) 97.2 F (36.2 C)  TempSrc: Oral   Temporal  SpO2: 99%  96% 92%  Weight:      Height:        Intake/Output Summary (Last 24 hours) at 07/13/2024 1322 Last data filed at 07/13/2024 0605 Gross per 24 hour  Intake 2736.71 ml  Output 450 ml  Net 2286.71 ml     Wt Readings from Last 3 Encounters:  07/12/24 50.8 kg  04/19/24 53.5 kg  04/14/24 53.5 kg    Physical Exam General: Alert and oriented x 3, NAD Cardiovascular: S1 S2 clear, RRR.  Respiratory: CTAB, no wheezing, rales or rhonchi Gastrointestinal: Soft, appropriately tender, incisions CDI, NBS Ext: no pedal edema bilaterally Neuro: no new deficits Psych: Normal affect      Data Reviewed:  I have personally reviewed following labs    CBC Lab Results  Component Value Date   WBC 10.5 07/13/2024   RBC 3.26 (L) 07/13/2024   HGB 9.8 (L) 07/13/2024   HCT 33.0 (L) 07/13/2024   MCV 101.2 (H) 07/13/2024   MCH 30.1 07/13/2024   PLT 194 07/13/2024   MCHC 29.7 (L) 07/13/2024   RDW 13.7 07/13/2024   LYMPHSABS 1.5 04/12/2024   MONOABS 0.7 04/12/2024   EOSABS 0.1 04/12/2024   BASOSABS 0.1 04/12/2024     Last metabolic panel Lab Results  Component Value Date   NA 140 07/13/2024   K 3.7 07/13/2024   CL 108 07/13/2024   CO2 25 07/13/2024   BUN 9 07/13/2024   CREATININE 0.47 07/13/2024   GLUCOSE 92 07/13/2024   GFRNONAA >60 07/13/2024   GFRAA >60 06/15/2020   CALCIUM  8.0 (L) 07/13/2024   PROT 5.6 (L) 07/13/2024   ALBUMIN  3.1 (L) 07/13/2024   LABGLOB 2.5 10/29/2017   AGRATIO 1.6 10/29/2017   BILITOT 0.7 07/13/2024   ALKPHOS 132 (H) 07/13/2024   AST 116 (H) 07/13/2024   ALT 140 (H) 07/13/2024   ANIONGAP 7 07/13/2024    CBG (last 3)  No results  for input(s): GLUCAP in the last 72 hours.    Coagulation Profile: No results for input(s): INR, PROTIME in the last 168 hours.   Radiology Studies: I  have personally reviewed the imaging studies  DG Cholangiogram Operative Result Date: 07/12/2024 EXAM: INTRAOPERATIVE CHOLANGIOGRAM TECHNIQUE: Fluoroscopy provided by the radiology department. Radiologist was not present during procedure. Image(s) submitted for review. FLUOROSCOPY DOSE AND TYPE: Radiation Dose Index: Reference Air Kerma (in mGy) = 8.02 Fluoroscopy Time (in seconds) = 35 COMPARISON: None available. CLINICAL HISTORY: Surgery, elective [id number]. FINDINGS: 3 fluoroscopic extending sequences are present for interpretation postprocedurally. These images demonstrate intraoperative changes of cholecystectomy with intraoperative cholangiography performed. The common bile duct is dilated measuring up to 16 mm in diameter. There is no passage of contrast into the duodenum by the ampulla. There is a rounded filling defect seen within the periampullary common duct measuring approximately 5 mm in keeping with a common duct stone or debris. Extravasated contrast is seen pooling within the upper abdomen. The intrahepatic biliary tree is not visualized on this examination. IMPRESSION: 1. Dilated common bile duct measuring up to 16 mm with a 5 mm rounded filling defect in the periampullary common duct, consistent with a common duct stone or debris. 2. No passage of contrast into the duodenum via the ampulla. 3. Intrahepatic biliary tree not visualized. Electronically signed by: Dorethia Molt MD 07/12/2024 09:19 PM EDT RP Workstation: [id number]   MR ABDOMEN MRCP W WO CONTAST Result Date: 07/12/2024 CLINICAL DATA:  Cholelithiasis [id number] EXAM: MRI ABDOMEN WITHOUT AND WITH CONTRAST (INCLUDING MRCP) TECHNIQUE: Multiplanar multisequence MR imaging of the abdomen was performed both before and after the administration of intravenous contrast. Heavily  T2-weighted images of the biliary and pancreatic ducts were obtained, and three-dimensional MRCP images were rendered by post processing. CONTRAST:  5mL GADAVIST  GADOBUTROL  1 MMOL/ML IV SOLN COMPARISON:  CT scan renal stone protocol from 07/11/2024. FINDINGS: Lower chest: Unremarkable MR appearance to the lung bases. No pleural effusion. No pericardial effusion. Normal heart size. Hepatobiliary: The liver is normal in size. Noncirrhotic configuration. No suspicious liver lesion. There is a 5 mm subcapsular simple cyst in the right hepatic lobe, segment 6 no intrahepatic bile duct dilation. The extrahepatic bile duct is dilated measuring up to 9 mm in the proximal portion 11 mm in the midportion and it gradually tapers in the distal portion measuring up to 4 mm just before the ampulla of Vater. However, there are several, sub 5 mm sized, dependent, filling defects in the distal CBD, compatible with choledocholithiasis. The gallbladder is distended and exhibit moderate circumferential wall thickening and pericholecystic fat stranding. There are moderate volume dependent gallstones. Findings are compatible with acute cholecystitis. Pancreas: No mass, inflammatory changes or other parenchymal abnormality identified. No main pancreatic duct dilation. Spleen:  Within normal limits in size and appearance. No focal mass. Adrenals/Urinary Tract: Unremarkable adrenal glands. No hydroureteronephrosis on either side. There are multiple bilateral sinus cysts with largest arising from the left kidney interpolar region measuring up to 1.2 x 2.2 cm. No suspicious renal lesion. Stomach/Bowel: Visualized portions within the abdomen are unremarkable. No disproportionate dilation of bowel loops. Vascular/Lymphatic: No pathologically enlarged lymph nodes identified. No abdominal aortic aneurysm demonstrated. No ascites. Other:  None. Musculoskeletal: No suspicious bone lesions identified. IMPRESSION: 1. Findings compatible with acute  cholecystitis. There is also dilation of the extrahepatic bile duct with several, sub 5 mm sized, dependent, filling defects in the distal CBD, compatible with choledocholithiasis. 2. Otherwise unremarkable exam. Electronically Signed   By: Ree Molt M.D.   On: 07/12/2024 09:58   MR 3D Recon At Scanner Result Date: 07/12/2024 CLINICAL DATA:  Cholelithiasis [id number] EXAM: MRI ABDOMEN WITHOUT  AND WITH CONTRAST (INCLUDING MRCP) TECHNIQUE: Multiplanar multisequence MR imaging of the abdomen was performed both before and after the administration of intravenous contrast. Heavily T2-weighted images of the biliary and pancreatic ducts were obtained, and three-dimensional MRCP images were rendered by post processing. CONTRAST:  5mL GADAVIST  GADOBUTROL  1 MMOL/ML IV SOLN COMPARISON:  CT scan renal stone protocol from 07/11/2024. FINDINGS: Lower chest: Unremarkable MR appearance to the lung bases. No pleural effusion. No pericardial effusion. Normal heart size. Hepatobiliary: The liver is normal in size. Noncirrhotic configuration. No suspicious liver lesion. There is a 5 mm subcapsular simple cyst in the right hepatic lobe, segment 6 no intrahepatic bile duct dilation. The extrahepatic bile duct is dilated measuring up to 9 mm in the proximal portion 11 mm in the midportion and it gradually tapers in the distal portion measuring up to 4 mm just before the ampulla of Vater. However, there are several, sub 5 mm sized, dependent, filling defects in the distal CBD, compatible with choledocholithiasis. The gallbladder is distended and exhibit moderate circumferential wall thickening and pericholecystic fat stranding. There are moderate volume dependent gallstones. Findings are compatible with acute cholecystitis. Pancreas: No mass, inflammatory changes or other parenchymal abnormality identified. No main pancreatic duct dilation. Spleen:  Within normal limits in size and appearance. No focal mass. Adrenals/Urinary Tract:  Unremarkable adrenal glands. No hydroureteronephrosis on either side. There are multiple bilateral sinus cysts with largest arising from the left kidney interpolar region measuring up to 1.2 x 2.2 cm. No suspicious renal lesion. Stomach/Bowel: Visualized portions within the abdomen are unremarkable. No disproportionate dilation of bowel loops. Vascular/Lymphatic: No pathologically enlarged lymph nodes identified. No abdominal aortic aneurysm demonstrated. No ascites. Other:  None. Musculoskeletal: No suspicious bone lesions identified. IMPRESSION: 1. Findings compatible with acute cholecystitis. There is also dilation of the extrahepatic bile duct with several, sub 5 mm sized, dependent, filling defects in the distal CBD, compatible with choledocholithiasis. 2. Otherwise unremarkable exam. Electronically Signed   By: Ree Molt M.D.   On: 07/12/2024 09:58       Lyfe Monger M.D. [facility] 07/13/2024, 1:22 PM  Available via Epic secure chat 7am-7pm After 7 pm, please refer to night coverage provider listed on amion.

## 2024-07-13 NOTE — Interval H&P Note (Signed)
 History and Physical Interval Note:  07/13/2024 12:53 PM  [name]  has presented today for surgery, with the diagnosis of Elevated LFTs, choledocholithiasis.  The various methods of treatment have been discussed with the patient and family. After consideration of risks, benefits and other options for treatment, the patient has consented to  Procedure(s): ERCP, WITH INTERVENTION IF INDICATED (N/A) as a surgical intervention.  The patient's history has been reviewed, patient examined, no change in status, stable for surgery.  I have reviewed the patient's chart and labs.  Questions were answered to the patient's satisfaction.    I have explained risks and benefits including small but definite risks of pancreatitis (<10%), bleeding (<1%), perforation (<1%). The risks of general anesthesia were also discussed by me and anesthesia staff.  The benefits were also discussed.  Patient wishes to proceed.  All the questions were answered.    Lynnie Bring

## 2024-07-13 NOTE — Anesthesia Procedure Notes (Signed)
 Procedure Name: Intubation Date/Time: 07/13/2024 1:09 PM  Performed by: Joshua Vernell BROCKS, CRNAPre-anesthesia Checklist: Patient identified, Emergency Drugs available, Suction available and Patient being monitored Patient Re-evaluated:Patient Re-evaluated prior to induction Oxygen Delivery Method: Circle system utilized Preoxygenation: Pre-oxygenation with 100% oxygen Induction Type: IV induction Ventilation: Mask ventilation without difficulty Laryngoscope Size: Mac and 3 Grade View: Grade I Tube type: Oral Tube size: 7.0 mm Number of attempts: 1 Airway Equipment and Method: Stylet Placement Confirmation: ETT inserted through vocal cords under direct vision, positive ETCO2 and breath sounds checked- equal and bilateral Secured at: 1 cm Tube secured with: Tape Dental Injury: Teeth and Oropharynx as per pre-operative assessment

## 2024-07-13 NOTE — Progress Notes (Addendum)
 General Surgery Follow Up Note  Subjective:    Overnight Issues:   Objective:  Vital signs for last 24 hours: Temp:  [97.5 F (36.4 C)-98.4 F (36.9 C)] 98.1 F (36.7 C) (10/28 0554) Pulse Rate:  [59-84] 61 (10/28 0616) Resp:  [12-20] 18 (10/28 0616) BP: (93-111)/(49-77) 103/55 (10/28 0616) SpO2:  [85 %-99 %] 96 % (10/28 0616) FiO2 (%):  [28 %] 28 % (10/27 1900)  Hemodynamic parameters for last 24 hours:    Intake/Output from previous day: 10/27 0701 - 10/28 0700 In: 2736.7 [I.V.:2583.1; IV Piggyback:153.6] Out: 450 [Urine:400; Blood:50]  Intake/Output this shift: No intake/output data recorded.  Vent settings for last 24 hours: FiO2 (%):  [28 %] 28 %  Physical Exam:  Gen: comfortable, no distress Neuro: follows commands, alert, communicative HEENT: PERRL Neck: supple CV: RRR Pulm: unlabored breathing on RA Abd: soft, NT, incision clean, dry, intact  GU: urine clear and yellow, +spontaneous void Extr: wwp, no edema  Results for orders placed or performed during the hospital encounter of 07/11/24 (from the past 24 hours)  CBC     Status: Abnormal   Collection Time: 07/13/24  5:07 AM  Result Value Ref Range   WBC 10.5 4.0 - 10.5 K/uL   RBC 3.26 (L) 3.87 - 5.11 MIL/uL   Hemoglobin 9.8 (L) 12.0 - 15.0 g/dL   HCT 66.9 (L) 63.9 - 53.9 %   MCV 101.2 (H) 80.0 - 100.0 fL   MCH 30.1 26.0 - 34.0 pg   MCHC 29.7 (L) 30.0 - 36.0 g/dL   RDW 86.2 88.4 - 84.4 %   Platelets 194 150 - 400 K/uL   nRBC 0.0 0.0 - 0.2 %  Comprehensive metabolic panel     Status: Abnormal   Collection Time: 07/13/24  5:07 AM  Result Value Ref Range   Sodium 140 135 - 145 mmol/L   Potassium 3.7 3.5 - 5.1 mmol/L   Chloride 108 98 - 111 mmol/L   CO2 25 22 - 32 mmol/L   Glucose, Bld 92 70 - 99 mg/dL   BUN 9 8 - 23 mg/dL   Creatinine, Ser 9.52 0.44 - 1.00 mg/dL   Calcium  8.0 (L) 8.9 - 10.3 mg/dL   Total Protein 5.6 (L) 6.5 - 8.1 g/dL   Albumin  3.1 (L) 3.5 - 5.0 g/dL   AST 883 (H) 15 - 41  U/L   ALT 140 (H) 0 - 44 U/L   Alkaline Phosphatase 132 (H) 38 - 126 U/L   Total Bilirubin 0.7 0.0 - 1.2 mg/dL   GFR, Estimated >39 >39 mL/min   Anion gap 7 5 - 15  Lipase, blood     Status: None   Collection Time: 07/13/24  5:07 AM  Result Value Ref Range   Lipase 18 11 - 51 U/L    Assessment & Plan:  Present on Admission:  Acute cholecystitis  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   and I reviewed the patients new imaging test results.    s/p Procedure(s): LAPAROSCOPIC CHOLECYSTECTOMY WITH INTRAOPERATIVE CHOLANGIOGRAM 07/12/2024   MRCP and IOC + for CBD obstruction, plan ERCP today Expected post-op course with good pain control Encourage PO and ambulation after ERCP today F/u with me in my office, scheduled. Prescriptions for pain meds sent. Discharge instructions ordered. Surgery to sign off, please reconsult  for questions/concerns.    Christina GEANNIE Hanger, MD Trauma & General Surgery Please use [email or web address] to contact on call provider  07/13/2024  *Care during the described time interval was provided by me. I have reviewed this patient's available data, including medical history, events of note, physical examination and test results as part of my evaluation.

## 2024-07-13 NOTE — Anesthesia Postprocedure Evaluation (Signed)
 Anesthesia Post Note  Patient: [name]  Procedure(s) Performed: LAPAROSCOPIC CHOLECYSTECTOMY WITH INTRAOPERATIVE CHOLANGIOGRAM     Patient location during evaluation: PACU Anesthesia Type: General Level of consciousness: awake and alert Pain management: pain level controlled Vital Signs Assessment: post-procedure vital signs reviewed and stable Respiratory status: spontaneous breathing, nonlabored ventilation and respiratory function stable Cardiovascular status: blood pressure returned to baseline Postop Assessment: no apparent nausea or vomiting Anesthetic complications: no   No notable events documented.            Vertell Row

## 2024-07-13 NOTE — Op Note (Signed)
 [facility] Patient Name: [name] Procedure Date: 07/13/2024 MRN: [id number] Attending MD: Lynnie Bring , MD, [id number] Date of Birth: 1950/12/20 CSN: [id number] Age: 73 Admit Type: Inpatient Procedure:                ERCP Indications:              Abnormal intraoperative cholangiogram/MRCP. Patient                            s/p lap chole yesterday with abnormal IOC. Providers:                Lynnie Bring, MD, Ozell Pouch, [place], Technician Referring MD:              Medicines:                General Anesthesia, Indomethacin 100 mg PR,                            Glucagon 0.2 mg IV, Zosyn IV Complications:            No immediate complications. Estimated Blood Loss:     Estimated blood loss: none. Procedure:                Pre-Anesthesia Assessment:                           - Prior to the procedure, a History and Physical                            was performed, and patient medications and                            allergies were reviewed. The patient's tolerance of                            previous anesthesia was also reviewed. The risks                            and benefits of the procedure and the sedation                            options and risks were discussed with the patient.                            All questions were answered, and informed consent                            was obtained. Prior Anticoagulants: The patient has                            taken no anticoagulant or antiplatelet agents. ASA  Grade Assessment: II - A patient with mild systemic                            disease. After reviewing the risks and benefits,                            the patient was deemed in satisfactory condition to                            undergo the procedure.                           After obtaining informed consent, the scope was                            passed under direct  vision. Throughout the                            procedure, the patient's blood pressure, pulse, and                            oxygen saturations were monitored continuously. The                            [id number]-Q190V ([id number] Olympus duodenoscope was                            introduced through the mouth, and used to inject                            contrast into and used to inject contrast into the                            bile duct. The ERCP was accomplished without                            difficulty. The patient tolerated the procedure                            well. Scope In: Scope Out: Findings:      A scout film of the abdomen was obtained. Surgical clips, consistent       with a previous cholecystectomy, were seen in the area of the right       upper quadrant of the abdomen. The esophagus was successfully intubated       under direct vision. The scope was advanced to a normal major papilla in       the descending duodenum without detailed examination of the pharynx,       larynx and associated structures, and upper GI tract. The upper GI tract       was grossly normal except for multiple ulcers in the bulb, first portion       of the duodenum and second portion of the duodenum. The largest ulcer       measured 1 cm. No active bleeding.  The bile duct was deeply cannulated with the short-nosed traction       sphincterotome using a wire-guided approach on first attempt. Contrast       was injected. I personally interpreted the bile duct images. Ductal flow       of contrast was adequate. Image quality was adequate. Contrast extended       to the entire biliary tree except GB.      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 Vague filling defects were       noted in the lower CBD with the CBD measuring approximately 12 mm. There       were no leaks. The right and left hepatic ducts were mildly dilated. An       8 mm biliary sphincterotomy was made with a monofilament  traction       (standard) sphincterotome using ERBE electrocautery on endocut mode at       12 o'clock position. There was no post-sphincterotomy bleeding. The       biliary tree was swept with a 12 mm balloon starting at the bifurcation       several times with extraction of sludge. There were no stones. No pus.      Pancreatic duet intentionally not cannulated or injected.      After completion of ERCP, 2 biopsies were obtained from the antrum to       rule out H. pylori. Impression:               - Papillary stenosis/biliary sludge s/p biliary                            sphincterotomy and balloon sweep.                           - S/P cholecystectomy. No leaks.                           - Duodenal ulcers. Moderate Sedation:      Not Applicable - Patient had care per Anesthesia. Recommendation:           - Return patient to hospital ward for ongoing care.                           - Clear liquid diet today.                           - Avoid nonsteroidals                           - Protonix  40 mg p.o. daily #90, 4RF                           - Follow biopsies for H. pylori.                           - Can stop antibiotics from GI standpoint.                           - Watch for pancreatitis, bleeding, perforation,  and cholangitis.                           - The findings and recommendations were discussed                            with the patient's family (sis)                           - If better, can D/C home tomorrow. Trend LFTs/CBC. Procedure Code(s):        --- Professional ---                           [phone], Endoscopic retrograde                            cholangiopancreatography (ERCP); with removal of                            calculi/debris from biliary/pancreatic duct(s)                           43262, Endoscopic retrograde                            cholangiopancreatography (ERCP); with                             sphincterotomy/papillotomy                           [phone], Endoscopic catheterization of the biliary                            ductal system, radiological supervision and                            interpretation Diagnosis Code(s):        --- Professional ---                           K80.50, Calculus of bile duct without cholangitis                            or cholecystitis without obstruction                           R93.2, Abnormal findings on diagnostic imaging of                            liver and biliary tract CPT copyright 2022 American [employer]. All rights reserved. The codes documented in this report are preliminary and upon coder review may  be revised to meet current compliance requirements. Lynnie Bring, MD 07/13/2024 1:57:55 PM This report has been signed electronically. Number of Addenda: 0

## 2024-07-13 NOTE — Transfer of Care (Signed)
 Immediate Anesthesia Transfer of Care Note  Patient: [name]  Procedure(s) Performed: Procedure(s): ERCP, WITH INTERVENTION IF INDICATED (N/A)  Patient Location: PACU  Anesthesia Type:General  Level of Consciousness: Patient easily awoken, comfortable, cooperative, following commands, responds to stimulation.   Airway & Oxygen Therapy: Patient spontaneously breathing, ventilating well, oxygen via simple oxygen mask.  Post-op Assessment: Report given to PACU RN, vital signs reviewed and stable, moving all extremities.   Post vital signs: Reviewed and stable.  Complications: No apparent anesthesia complications Last Vitals:  Vitals Value Taken Time  BP 121/42 07/13/24 13:57  Temp    Pulse 72 07/13/24 13:57  Resp 19 07/13/24 13:57  SpO2 99 % 07/13/24 13:57    Last Pain:  Vitals:   07/13/24 1357  TempSrc:   PainSc: 0-No pain      Patients Stated Pain Goal: 0 (07/13/24 0609)  Complications: No notable events documented.

## 2024-07-14 ENCOUNTER — Encounter (HOSPITAL_COMMUNITY): Payer: Self-pay | Admitting: Gastroenterology

## 2024-07-14 DIAGNOSIS — E039 Hypothyroidism, unspecified: Secondary | ICD-10-CM | POA: Diagnosis not present

## 2024-07-14 DIAGNOSIS — C3490 Malignant neoplasm of unspecified part of unspecified bronchus or lung: Secondary | ICD-10-CM | POA: Diagnosis not present

## 2024-07-14 DIAGNOSIS — K8042 Calculus of bile duct with acute cholecystitis without obstruction: Secondary | ICD-10-CM | POA: Diagnosis not present

## 2024-07-14 DIAGNOSIS — K269 Duodenal ulcer, unspecified as acute or chronic, without hemorrhage or perforation: Secondary | ICD-10-CM | POA: Diagnosis not present

## 2024-07-14 DIAGNOSIS — K810 Acute cholecystitis: Secondary | ICD-10-CM | POA: Diagnosis not present

## 2024-07-14 DIAGNOSIS — J449 Chronic obstructive pulmonary disease, unspecified: Secondary | ICD-10-CM | POA: Diagnosis not present

## 2024-07-14 LAB — COMPREHENSIVE METABOLIC PANEL WITH GFR
ALT: 107 U/L — ABNORMAL HIGH (ref 0–44)
AST: 64 U/L — ABNORMAL HIGH (ref 15–41)
Albumin: 3.2 g/dL — ABNORMAL LOW (ref 3.5–5.0)
Alkaline Phosphatase: 115 U/L (ref 38–126)
Anion gap: 11 (ref 5–15)
BUN: 8 mg/dL (ref 8–23)
CO2: 21 mmol/L — ABNORMAL LOW (ref 22–32)
Calcium: 8 mg/dL — ABNORMAL LOW (ref 8.9–10.3)
Chloride: 106 mmol/L (ref 98–111)
Creatinine, Ser: 0.5 mg/dL (ref 0.44–1.00)
GFR, Estimated: >60 mL/min (ref >60)
Glucose, Bld: 84 mg/dL (ref 70–99)
Potassium: 4.2 mmol/L (ref 3.5–5.1)
Sodium: 138 mmol/L (ref 135–145)
Total Bilirubin: 0.6 mg/dL (ref 0.0–1.2)
Total Protein: 5.7 g/dL — ABNORMAL LOW (ref 6.5–8.1)

## 2024-07-14 LAB — CBC
HCT: 30.8 % — ABNORMAL LOW (ref 36.0–46.0)
Hemoglobin: 9.8 g/dL — ABNORMAL LOW (ref 12.0–15.0)
MCH: 31.2 pg (ref 26.0–34.0)
MCHC: 31.8 g/dL (ref 30.0–36.0)
MCV: 98.1 fL (ref 80.0–100.0)
Platelets: 201 K/uL (ref 150–400)
RBC: 3.14 MIL/uL — ABNORMAL LOW (ref 3.87–5.11)
RDW: 13.8 % (ref 11.5–15.5)
WBC: 10.3 K/uL (ref 4.0–10.5)
nRBC: 0 % (ref 0.0–0.2)

## 2024-07-14 LAB — TSH: TSH: 10.4 u[IU]/mL — ABNORMAL HIGH (ref 0.350–4.500)

## 2024-07-14 MED ORDER — ONDANSETRON HCL 4 MG PO TABS: 4.0000 mg | ORAL_TABLET | Freq: Four times a day (QID) | ORAL | 0 refills | Status: AC | PRN

## 2024-07-14 MED ORDER — AMOXICILLIN-POT CLAVULANATE 875-125 MG PO TABS: 1.0000 | ORAL_TABLET | Freq: Two times a day (BID) | ORAL | 0 refills | Status: AC

## 2024-07-14 MED ORDER — AMOXICILLIN-POT CLAVULANATE 875-125 MG PO TABS
1.0000 | ORAL_TABLET | Freq: Two times a day (BID) | ORAL | Status: DC
  Administered 2024-07-14: 1 via ORAL
  Filled 2024-07-14: qty 1

## 2024-07-14 MED ORDER — PANTOPRAZOLE SODIUM 40 MG PO TBEC: 40.0000 mg | DELAYED_RELEASE_TABLET | Freq: Two times a day (BID) | ORAL | 0 refills | Status: AC

## 2024-07-14 NOTE — TOC Transition Note (Signed)
 Transition of Care [facility]) - Discharge Note   Patient Details  Name: [name] MRN: [id number] Date of Birth: 09-20-1950  [facility] CM/SW Contact:  Bascom Service, RN Phone Number: 07/14/2024, 2:39 PM   Clinical Narrative:  d/c home no CM needs.     Final next level of care: Home/Self Care Barriers to Discharge: No Barriers Identified   Patient Goals and CMS Choice Patient states their goals for this hospitalization and ongoing recovery are:: Home CMS Medicare.gov Compare Post Acute Care list provided to:: Patient Choice offered to / list presented to : Patient [place] ownership interest in [facility].provided to:: Patient    Discharge Placement                       Discharge Plan and Services Additional resources added to the After Visit Summary for     Discharge Planning Services: CM Consult                                 Social Drivers of Health (SDOH) Interventions SDOH Screenings   Food Insecurity: No Food Insecurity (07/11/2024)  Housing: Unknown (07/11/2024)  Transportation Needs: No Transportation Needs (07/11/2024)  Utilities: Not At Risk (07/11/2024)  Depression (PHQ2-9): Low Risk  (02/27/2022)  Financial Resource Strain: Low Risk  (12/14/2023)   Received from [facility]  Physical Activity: Insufficiently Active (06/13/2024)   Received from [facility]  Social Connections: Moderately Integrated (07/11/2024)  Stress: Stress Concern Present (06/13/2024)   Received from [facility]  Tobacco Use: Medium Risk (07/13/2024)     Readmission Risk Interventions     No data to display

## 2024-07-14 NOTE — Anesthesia Postprocedure Evaluation (Signed)
 Anesthesia Post Note  Patient: [name]  Procedure(s) Performed: ERCP, WITH INTERVENTION IF INDICATED     Patient location during evaluation: PACU Anesthesia Type: General Level of consciousness: sedated and patient cooperative Pain management: pain level controlled Vital Signs Assessment: post-procedure vital signs reviewed and stable Respiratory status: spontaneous breathing Cardiovascular status: stable Anesthetic complications: no   No notable events documented.  Last Vitals:  Vitals:   07/14/24 0919 07/14/24 1310  BP:  (!) 100/51  Pulse:  66  Resp:  16  Temp:  36.6 C  SpO2: 92% 93%    Last Pain:  Vitals:   07/14/24 1310  TempSrc: Oral  PainSc:                  Norleen Pope

## 2024-07-14 NOTE — Evaluation (Signed)
 Occupational Therapy Evaluation Patient Details Name: [name] MRN: [id number] DOB: 1951/09/06 Today's Date: 07/14/2024   History of Present Illness   Keiera Strathman is a 73 year old female Taeler Winning Brazeau s/p cholecystectomy 07/12/24 with dx of Elevated LFTs, choledocholithiasis. 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 All education has been completed and the patient has no further questions. Amb on unit with no issues, LOB and VSS. Sister will obtain reacher for use and patient able to teach back safety and energy conservation. See below for any follow-up Occupational Therapy or equipment needs. OT is signing off. Thank you for this referral.      If plan is discharge home, recommend the following:   Assist for transportation;Help with stairs or ramp for entrance     Functional Status Assessment   Patient has not had a recent decline in their functional status     Equipment Recommendations   None recommended by OT      Precautions/Restrictions   Precautions Precautions: None Restrictions Weight Bearing Restrictions Per Provider Order: No     Mobility Bed Mobility Overal bed mobility: Modified Independent                  Transfers Overall transfer level: Modified independent                        Balance Overall balance assessment: No apparent balance deficits (not formally assessed)                                         ADL either performed or assessed with clinical judgement   ADL Overall ADL's : Modified independent                                       General ADL Comments: educatred on figure 4 and use of reacher as needed     Vision Baseline Vision/History: 0 No visual deficits;1 Wears  glasses Vision Assessment?: No apparent visual deficits             Pertinent Vitals/Pain Pain Assessment Pain Assessment: 0-10 Pain Score: 1  Pain Location: abdominal Pain Descriptors / Indicators: Discomfort Pain Intervention(s): Monitored during session, Repositioned     Extremity/Trunk Assessment Upper Extremity Assessment Upper Extremity Assessment: Overall WFL for tasks assessed;Right hand dominant   Lower Extremity Assessment Lower Extremity Assessment: Overall WFL for tasks assessed   Cervical / Trunk Assessment Cervical / Trunk Assessment: Normal   Communication Communication Communication: No apparent difficulties   Cognition Arousal: Alert Behavior During Therapy: WFL for tasks assessed/performed Cognition: No apparent impairments                               Following commands: Intact       Cueing  General Comments   Cueing Techniques: Verbal cues  VSR: 118/72, 99% and HR 88           Home Living Family/patient expects to be discharged to:: Private residence Living Arrangements: Alone Available Help at Discharge: Family;Friend(s);Available 24 hours/day Type of Home: House Home  Access: Stairs to enter [employer] of Steps: 1 Entrance Stairs-Rails: Right Home Layout: One level     Bathroom Shower/Tub: Chief Strategy Officer: Standard Bathroom Accessibility: Yes   Home Equipment: Shower seat   Additional Comments: sister will be available      Prior Functioning/Environment Prior Level of Function : Independent/Modified Independent             Mobility Comments: indep ADLs Comments: indep    OT Problem List: Decreased activity tolerance    AM-PAC OT 6 Clicks Daily Activity     Outcome Measure Help from another person eating meals?: None Help from another person taking care of personal grooming?: None Help from another person toileting, which includes using toliet, bedpan, or urinal?:  None Help from another person bathing (including washing, rinsing, drying)?: None Help from another person to put on and taking off regular upper body clothing?: None Help from another person to put on and taking off regular lower body clothing?: None 6 Click Score: 24   End of Session Equipment Utilized During Treatment: Gait belt;Rolling walker (2 wheels) Nurse Communication: Mobility status  Activity Tolerance: Patient tolerated treatment well Patient left: in chair;with call bell/phone within reach;with chair alarm set  OT Visit Diagnosis: Pain                Time: 9172-9143 OT Time Calculation (min): 29 min Charges:  OT General Charges $OT Visit: 1 Visit OT Evaluation $OT Eval Low Complexity: 1 Low  Avyn Aden OT/L Acute Rehabilitation Department  [phone]  07/14/2024, 9:07 AM

## 2024-07-14 NOTE — Discharge Summary (Signed)
 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 [facility] Course: No notes on file  Assessment and Plan: No notes have been filed under this hospital service. Service: Hospitalist     {Tip this will not be part of the note when signed Body mass index is 19.84 kg/m. , ,  (Optional):26781}  {(NOTE) Pain control PDMP Statment (Optional):26782} Consultants: *** Procedures performed: ***  Disposition: {Plan; Disposition:26390} Diet recommendation:  Discharge Diet Orders (From admission, onward)     Start     Ordered   07/14/24 0000  Diet - low sodium heart healthy        07/14/24 1435           Cardiac diet DISCHARGE MEDICATION: Allergies as of 07/14/2024   No Known Allergies      Medication List     TAKE these medications    acetaminophen  500 MG tablet Commonly known as: TYLENOL  Take 2 tablets (1,000 mg total) by mouth 4 (four) times daily. What changed:  medication strength how much to take when to take this reasons to take this   albuterol  108 (90 Base) MCG/ACT inhaler Commonly known as: VENTOLIN  HFA Inhale 2 puffs into the lungs every 4 (four) hours as needed for wheezing or shortness of breath (cough, shortness of breath or wheezing.).   alendronate 70 MG tablet Commonly known as: FOSAMAX Take 70 mg by mouth once  a week.   amoxicillin-clavulanate 875-125 MG tablet Commonly known as: AUGMENTIN Take 1 tablet by mouth every 12 (twelve) hours for 4 days.   calcium  carbonate 600 MG Tabs tablet Commonly known as: OS-CAL Take 600 mg by mouth daily with breakfast.   fluticasone  50 MCG/ACT nasal spray Commonly known as: FLONASE  Place 2 sprays into both nostrils daily.   Fluticasone -Salmeterol 250-50 MCG/DOSE Aepb Commonly known as: ADVAIR Inhale 1 puff into the lungs 2 (two) times daily.   ibuprofen 600 MG tablet Commonly known as: ADVIL Take 1 tablet (600 mg total) by mouth 4 (four) times daily.   levothyroxine  75 MCG tablet Commonly known as: SYNTHROID  TAKE 1 TABLET BY MOUTH EVERY DAY BEFORE BREAKFAST   methocarbamol 750 MG tablet Commonly known as: ROBAXIN Take 1 tablet (750 mg total) by mouth 4 (four) times daily.   multivitamin tablet Take 1 tablet by mouth daily.   ondansetron  4 MG tablet Commonly known as: ZOFRAN  Take 1 tablet (4 mg total) by mouth every 6 (six) hours as needed for nausea.   pantoprazole  40 MG tablet Commonly known as: PROTONIX  Take 1 tablet (40 mg total) by mouth 2 (two) times daily.   polyethylene glycol 17 g packet Commonly known as: MiraLax  Take 17 g by mouth daily. What changed: You were already taking a medication with the same name, and this prescription was added. Make sure you understand how and when  to take each.   polyethylene glycol powder 17 GM/SCOOP powder Commonly known as: GLYCOLAX /MIRALAX  Take 17 g by mouth daily. What changed:  when to take this reasons to take this   traMADol  50 MG tablet Commonly known as: Ultram  Take 1 tablet (50 mg total) by mouth every 4 (four) hours as needed for severe pain (pain score 7-10).   VITAMIN D3 PO Take 2,000 Units by mouth daily.        Follow-up Information     Maczis, Puja Gosai, PA-C. Go on 08/05/2024.   Specialty: General Surgery Why: 3 PM, please arrive 30 min prior to appointment time  to check in. Contact information: [address] [facility] [place] [address] [phone]                Discharge Exam: Filed Weights   07/11/24 1023 07/12/24 1109  Weight: 50.8 kg 50.8 kg   Vitals:   07/14/24 0919 07/14/24 1310  BP:  (!) 100/51  Pulse:  66  Resp:  16  Temp:  97.9 F (36.6 C)  SpO2: 92% 93%   Examination: Physical Exam:  Constitutional: WN/WD, NAD and appears calm and comfortable Eyes: PERRL, lids and conjunctivae normal, sclerae anicteric  ENMT: External Ears, Nose appear normal. Grossly normal hearing. Mucous membranes are moist. Posterior pharynx clear of any exudate or lesions. Normal dentition.  Neck: Appears normal, supple, no cervical masses, normal ROM, no appreciable thyromegaly Respiratory: Clear to auscultation bilaterally, no wheezing, rales, rhonchi or crackles. Normal respiratory effort and patient is not tachypenic. No accessory muscle use.  Cardiovascular: RRR, no murmurs / rubs / gallops. S1 and S2 auscultated. No extremity edema. 2+ pedal pulses. No carotid bruits.  Abdomen: Soft, non-tender, non-distended. No masses palpated. No appreciable hepatosplenomegaly. Bowel sounds positive.  GU: Deferred. Musculoskeletal: No clubbing / cyanosis of digits/nails. No joint deformity upper and lower extremities. Good ROM, no contractures. Normal strength and muscle tone.  Skin: No rashes, lesions, ulcers. No induration; Warm and dry.  Neurologic: CN 2-12 grossly intact with no focal deficits. Sensation intact in all 4 Extremities, DTR normal. Strength 5/5 in all 4. Romberg sign cerebellar reflexes not assessed.  Psychiatric: Normal judgment and insight. Alert and oriented x 3. Normal mood and appropriate affect.   Condition at discharge: {DC Condition:26389}  The results of significant diagnostics from this hospitalization (including imaging, microbiology, ancillary and laboratory) are listed below for  reference.   Imaging Studies: DG Cholangiogram Operative Result Date: 07/12/2024 EXAM: INTRAOPERATIVE CHOLANGIOGRAM TECHNIQUE: Fluoroscopy provided by the radiology department. Radiologist was not present during procedure. Image(s) submitted for review. FLUOROSCOPY DOSE AND TYPE: Radiation Dose Index: Reference Air Kerma (in mGy) = 8.02 Fluoroscopy Time (in seconds) = 35 COMPARISON: None available. CLINICAL HISTORY: Surgery, elective [id number]. FINDINGS: 3 fluoroscopic extending sequences are present for interpretation postprocedurally. These images demonstrate intraoperative changes of cholecystectomy with intraoperative cholangiography performed. The common bile duct is dilated measuring up to 16 mm in diameter. There is no passage of contrast into the duodenum by the ampulla. There is a rounded filling defect seen within the periampullary common duct measuring approximately 5 mm in keeping with a common duct stone or debris. Extravasated contrast is seen pooling within the upper abdomen. The intrahepatic biliary tree is not visualized on this examination. IMPRESSION: 1. Dilated common bile duct measuring up to 16 mm with a 5 mm rounded filling defect in the periampullary common duct, consistent with a common duct stone or debris. 2.  No passage of contrast into the duodenum via the ampulla. 3. Intrahepatic biliary tree not visualized. Electronically signed by: Dorethia Molt MD 07/12/2024 09:19 PM EDT RP Workstation: [id number]   MR ABDOMEN MRCP W WO CONTAST Result Date: 07/12/2024 CLINICAL DATA:  Cholelithiasis [id number] EXAM: MRI ABDOMEN WITHOUT AND WITH CONTRAST (INCLUDING MRCP) TECHNIQUE: Multiplanar multisequence MR imaging of the abdomen was performed both before and after the administration of intravenous contrast. Heavily T2-weighted images of the biliary and pancreatic ducts were obtained, and three-dimensional MRCP images were rendered by post processing. CONTRAST:  5mL GADAVIST  GADOBUTROL  1 MMOL/ML IV  SOLN COMPARISON:  CT scan renal stone protocol from 07/11/2024. FINDINGS: Lower chest: Unremarkable MR appearance to the lung bases. No pleural effusion. No pericardial effusion. Normal heart size. Hepatobiliary: The liver is normal in size. Noncirrhotic configuration. No suspicious liver lesion. There is a 5 mm subcapsular simple cyst in the right hepatic lobe, segment 6 no intrahepatic bile duct dilation. The extrahepatic bile duct is dilated measuring up to 9 mm in the proximal portion 11 mm in the midportion and it gradually tapers in the distal portion measuring up to 4 mm just before the ampulla of Vater. However, there are several, sub 5 mm sized, dependent, filling defects in the distal CBD, compatible with choledocholithiasis. The gallbladder is distended and exhibit moderate circumferential wall thickening and pericholecystic fat stranding. There are moderate volume dependent gallstones. Findings are compatible with acute cholecystitis. Pancreas: No mass, inflammatory changes or other parenchymal abnormality identified. No main pancreatic duct dilation. Spleen:  Within normal limits in size and appearance. No focal mass. Adrenals/Urinary Tract: Unremarkable adrenal glands. No hydroureteronephrosis on either side. There are multiple bilateral sinus cysts with largest arising from the left kidney interpolar region measuring up to 1.2 x 2.2 cm. No suspicious renal lesion. Stomach/Bowel: Visualized portions within the abdomen are unremarkable. No disproportionate dilation of bowel loops. Vascular/Lymphatic: No pathologically enlarged lymph nodes identified. No abdominal aortic aneurysm demonstrated. No ascites. Other:  None. Musculoskeletal: No suspicious bone lesions identified. IMPRESSION: 1. Findings compatible with acute cholecystitis. There is also dilation of the extrahepatic bile duct with several, sub 5 mm sized, dependent, filling defects in the distal CBD, compatible with choledocholithiasis. 2.  Otherwise unremarkable exam. Electronically Signed   By: Ree Molt M.D.   On: 07/12/2024 09:58   MR 3D Recon At Scanner Result Date: 07/12/2024 CLINICAL DATA:  Cholelithiasis [id number] EXAM: MRI ABDOMEN WITHOUT AND WITH CONTRAST (INCLUDING MRCP) TECHNIQUE: Multiplanar multisequence MR imaging of the abdomen was performed both before and after the administration of intravenous contrast. Heavily T2-weighted images of the biliary and pancreatic ducts were obtained, and three-dimensional MRCP images were rendered by post processing. CONTRAST:  5mL GADAVIST  GADOBUTROL  1 MMOL/ML IV SOLN COMPARISON:  CT scan renal stone protocol from 07/11/2024. FINDINGS: Lower chest: Unremarkable MR appearance to the lung bases. No pleural effusion. No pericardial effusion. Normal heart size. Hepatobiliary: The liver is normal in size. Noncirrhotic configuration. No suspicious liver lesion. There is a 5 mm subcapsular simple cyst in the right hepatic lobe, segment 6 no intrahepatic bile duct dilation. The extrahepatic bile duct is dilated measuring up to 9 mm in the proximal portion 11 mm in the midportion and it gradually tapers in the distal portion measuring up to 4 mm just before the ampulla of Vater. However, there are several, sub 5 mm sized, dependent, filling defects in the distal CBD, compatible with choledocholithiasis. The gallbladder is distended and exhibit moderate circumferential wall thickening and pericholecystic  fat stranding. There are moderate volume dependent gallstones. Findings are compatible with acute cholecystitis. Pancreas: No mass, inflammatory changes or other parenchymal abnormality identified. No main pancreatic duct dilation. Spleen:  Within normal limits in size and appearance. No focal mass. Adrenals/Urinary Tract: Unremarkable adrenal glands. No hydroureteronephrosis on either side. There are multiple bilateral sinus cysts with largest arising from the left kidney interpolar region measuring up to  1.2 x 2.2 cm. No suspicious renal lesion. Stomach/Bowel: Visualized portions within the abdomen are unremarkable. No disproportionate dilation of bowel loops. Vascular/Lymphatic: No pathologically enlarged lymph nodes identified. No abdominal aortic aneurysm demonstrated. No ascites. Other:  None. Musculoskeletal: No suspicious bone lesions identified. IMPRESSION: 1. Findings compatible with acute cholecystitis. There is also dilation of the extrahepatic bile duct with several, sub 5 mm sized, dependent, filling defects in the distal CBD, compatible with choledocholithiasis. 2. Otherwise unremarkable exam. Electronically Signed   By: Ree Molt M.D.   On: 07/12/2024 09:58   CT Renal Stone Study Result Date: 07/11/2024 EXAM: CT UROGRAM 07/11/2024 11:00:41 AM TECHNIQUE: CT of the abdomen and pelvis was performed without the administration of intravenous contrast as per CT urogram protocol. Multiplanar reformatted images as well as MIP urogram images are provided for review. Automated exposure control, iterative reconstruction, and/or weight based adjustment of the mA/kV was utilized to reduce the radiation dose to as low as reasonably achievable. COMPARISON: 04/12/2024 CLINICAL HISTORY: Abdominal/flank pain, stone suspected. Patient reports abdominal pain starting 2 days ago radiating to back. Patient endorses nausea/vomiting. Patient reports constipation since Friday (24OCT). Patient reports dark urine. Patient denies any history of kidney stones. FINDINGS: LOWER CHEST: No acute abnormality. LIVER: No focal liver abnormality. GALLBLADDER AND BILE DUCTS: Gallbladder distention with wall thickening and surrounding soft tissue stranding. Stones within the gallbladder measure up to 1.1 cm (image 26/2). Common bile duct measures 9 to 10 mm in diameter (image 51/3). No calcified stone identified along the course of the common bile duct. SPLEEN: No acute abnormality. PANCREAS: No acute abnormality. ADRENAL GLANDS:  No acute abnormality. KIDNEYS, URETERS AND BLADDER: No nephrolithiasis or obstructive uropathy. Bladder appears decompressed. No perinephric or periureteral stranding. GI AND BOWEL: Stomach demonstrates no acute abnormality. Moderate retained stool noted within the colon. Scattered colonic diverticula without signs of acute diverticulitis. There is no bowel obstruction. PERITONEUM AND RETROPERITONEUM: No ascites. No free air. A small fat-containing supraumbilical and periumbilical hernia is noted. VASCULATURE: Aorta is normal in caliber. Aortic atherosclerosis. LYMPH NODES: No lymphadenopathy. REPRODUCTIVE ORGANS: No acute abnormality. 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 Curvature of the lumbar spine is convex towards the left. Lumbar degenerative disc disease noted. No acute osseous abnormality. No focal soft tissue abnormality. IMPRESSION: 1. Acute cholecystitis with gallbladder distention, wall thickening, surrounding inflammatory stranding, and gallstones measuring up to 1.1 cm. 2. Common bile duct dilation measuring 910 mm without a visible calcified ductal stone. Electronically signed by: Waddell Calk MD 07/11/2024 11:25 AM EDT RP Workstation: [id number]    Microbiology: Results for orders placed or performed during the hospital encounter of 07/11/24  Culture, blood (Routine X 2) w Reflex to ID Panel     Status: None (Preliminary result)   Collection Time: 07/11/24  6:39 PM   Specimen: BLOOD LEFT HAND  Result Value Ref Range Status   Specimen Description BLOOD LEFT HAND  Final   Special Requests   Final    BOTTLES DRAWN AEROBIC ONLY Blood Culture results may not be optimal due to  an inadequate volume of blood received in culture bottles   Culture   Final    NO GROWTH 3 DAYS Performed at [facility], [address], [place], [place] [address]    Report Status PENDING  Incomplete  Culture, blood (Routine X 2) w Reflex to ID Panel      Status: None (Preliminary result)   Collection Time: 07/11/24  6:39 PM   Specimen: BLOOD LEFT HAND  Result Value Ref Range Status   Specimen Description BLOOD LEFT HAND  Final   Special Requests   Final    BOTTLES DRAWN AEROBIC ONLY Blood Culture results may not be optimal due to an inadequate volume of blood received in culture bottles   Culture   Final    NO GROWTH 3 DAYS Performed at [facility], [address] [place], [place] [address]    Report Status PENDING  Incomplete  Surgical PCR screen     Status: None   Collection Time: 07/12/24 10:05 AM   Specimen: Nasal Mucosa; Nasal Swab  Result Value Ref Range Status   MRSA, PCR NEGATIVE NEGATIVE Final   Staphylococcus aureus NEGATIVE NEGATIVE Final    Comment: (NOTE) The Xpert SA Assay (FDA approved for NASAL specimens in patients 26 years of age and older), is one component of a comprehensive surveillance program. It is not intended to diagnose infection nor to guide or monitor treatment. Performed at [facility], [address] [place], [place] [address]     Labs: CBC: Recent Labs  Lab 07/11/24 1025 07/12/24 0508 07/13/24 0507 07/14/24 0530  WBC 14.2* 10.7* 10.5 10.3  HGB 12.3 10.3* 9.8* 9.8*  HCT 37.0 33.6* 33.0* 30.8*  MCV 94.4 99.4 101.2* 98.1  PLT 225 184 194 201   Basic Metabolic Panel: Recent Labs  Lab 07/11/24 1025 07/12/24 0508 07/13/24 0507 07/14/24 0530  NA 137 139 140 138  K 3.7 3.7 3.7 4.2  CL 99 105 108 106  CO2 26 25 25  21*  GLUCOSE 110* 83 92 84  BUN 10 12 9 8   CREATININE 0.82 0.60 0.47 0.50  CALCIUM  9.8 8.3* 8.0* 8.0*   Liver Function Tests: Recent Labs  Lab 07/11/24 1025 07/12/24 0508 07/13/24 0507 07/14/24 0530  AST 377* 133* 116* 64*  ALT 241* 158* 140* 107*  ALKPHOS 169* 142* 132* 115  BILITOT 4.4* 1.5* 0.7 0.6  PROT 7.2 6.0* 5.6* 5.7*  ALBUMIN  4.2 3.5 3.1* 3.2*   CBG: No results for input(s): GLUCAP in the last 168 hours.  Discharge time  spent: {LESS THAN/GREATER UYJW:73611} 30 minutes.  Signed: Alejandro Lazarus Marker, DO [facility] 07/14/2024

## 2024-07-14 NOTE — Progress Notes (Addendum)
 Progress Note   LOS: 3 days   Chief Complaint: Cholecystitis and choledocholithiasis   Subjective   Patient is doing well today and sitting comfortably in recliner.  She was told she is being discharged this a.m. and feels great.  Recently completed session with mobility specialists.  Tolerating diet without difficulty.  Minor incision pain from cholecystectomy but other than that feeling good.    Objective   Vital signs in last 24 hours: Temp:  [97.2 F (36.2 C)-98.4 F (36.9 C)] 98.4 F (36.9 C) (10/29 0454) Pulse Rate:  [59-72] 61 (10/29 0454) Resp:  [13-19] 18 (10/29 0606) BP: (98-123)/(40-66) 102/58 (10/29 0454) SpO2:  [92 %-100 %] 92 % (10/29 0919) Last BM Date : 07/13/24 Last BM recorded by nurses in past 5 days No data recorded  General:   female in no acute distress  Heart:  Regular rate and rhythm; no murmurs Pulm: Clear anteriorly; no wheezing Abdomen: soft, nondistended, normal bowel sounds in all quadrants. Nontender without guarding. No organomegaly appreciated. Extremities:  No edema Neurologic:  Alert and  oriented x4;  No focal deficits.  Psych:  Cooperative. Normal mood and affect.  Intake/Output from previous day: 10/28 0701 - 10/29 0700 In: 2888.5 [P.O.:240; I.V.:2488.4; IV Piggyback:160.1] Out: 350 [Urine:350] Intake/Output this shift: No intake/output data recorded.  Studies/Results: DG Cholangiogram Operative Result Date: 07/12/2024 EXAM: INTRAOPERATIVE CHOLANGIOGRAM TECHNIQUE: Fluoroscopy provided by the radiology department. Radiologist was not present during procedure. Image(s) submitted for review. FLUOROSCOPY DOSE AND TYPE: Radiation Dose Index: Reference Air Kerma (in mGy) = 8.02 Fluoroscopy Time (in seconds) = 35 COMPARISON: None available. CLINICAL HISTORY: Surgery, elective [id number]. FINDINGS: 3 fluoroscopic extending sequences are present for interpretation postprocedurally. These images demonstrate intraoperative changes of  cholecystectomy with intraoperative cholangiography performed. The common bile duct is dilated measuring up to 16 mm in diameter. There is no passage of contrast into the duodenum by the ampulla. There is a rounded filling defect seen within the periampullary common duct measuring approximately 5 mm in keeping with a common duct stone or debris. Extravasated contrast is seen pooling within the upper abdomen. The intrahepatic biliary tree is not visualized on this examination. IMPRESSION: 1. Dilated common bile duct measuring up to 16 mm with a 5 mm rounded filling defect in the periampullary common duct, consistent with a common duct stone or debris. 2. No passage of contrast into the duodenum via the ampulla. 3. Intrahepatic biliary tree not visualized. Electronically signed by: Dorethia Molt MD 07/12/2024 09:19 PM EDT RP Workstation: [id number]    Lab Results: Recent Labs    07/12/24 0508 07/13/24 0507 07/14/24 0530  WBC 10.7* 10.5 10.3  HGB 10.3* 9.8* 9.8*  HCT 33.6* 33.0* 30.8*  PLT 184 194 201   BMET Recent Labs    07/12/24 0508 07/13/24 0507 07/14/24 0530  NA 139 140 138  K 3.7 3.7 4.2  CL 105 108 106  CO2 25 25 21*  GLUCOSE 83 92 84  BUN 12 9 8   CREATININE 0.60 0.47 0.50  CALCIUM  8.3* 8.0* 8.0*   LFT Recent Labs    07/11/24 1025 07/12/24 0508 07/14/24 0530  PROT 7.2   < > 5.7*  ALBUMIN  4.2   < > 3.2*  AST 377*   < > 64*  ALT 241*   < > 107*  ALKPHOS 169*   < > 115  BILITOT 4.4*   < > 0.6  BILIDIR 2.7*  --   --   IBILI 1.6*  --   --    < > =  values in this interval not displayed.   PT/INR No results for input(s): LABPROT, INR in the last 72 hours.   Scheduled Meds:  fluticasone   2 spray Each Nare Daily   fluticasone  furoate-vilanterol  1 puff Inhalation Daily   levothyroxine   75 mcg Oral Q0600   pantoprazole   40 mg Oral BID   Continuous Infusions:  sodium chloride  125 mL/hr at 07/14/24 0605   piperacillin-tazobactam (ZOSYN)  IV 3.375 g (07/14/24  9171)     Impression:   73 y.o. female with past medical history significant for PMH significant of metastatic non-small cell lung cancer with brain metastasis, hypothyroidism, COPD, prior tobacco use, presents for evaluation of elevated LFTs and RUQ pain   Acute cholecystitis w/ choledocholithiasis CTAP with contrast showing acute cholecystitis without definitive CBD stone (CBD dilation 10 mm). MRCP with choledocholithiasis S/p cholecystectomy 10/27 S/p ERCP 10/28 with papillary stenosis/biliary sludge s/p biliary sphincterotomy and balloon sweep.  No leaks.  Duodenal ulcers. LFTs improving  Duodenal ulcers incidentally found on ERCP Continue Protonix  40 mg daily Follow biopsies for H. 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  Patient can likely be discharged from GI standpoint. - Continue Protonix  for duodenal ulcers - Follow biopsies for H. pylori  Bayley M McMichael  07/14/2024, 10:00 AM     Attending physician's note   I have taken history, reviewed the chart and examined the patient. I performed a substantive portion of this encounter, including complete performance of at least one of the key components, in conjunction with the APP. I agree with the Advanced Practitioner's note, impression and recommendations.   Doing great Tolerating p.o. without any problems.  No abdominal pain Exam unremarkable.  Plan as per above. Avoid nonsteroidals Continue Protonix  Ok to D/C home from GI standpoint  Will sign off for now.   Anselm Bring, MD [facility] [phone]

## 2024-07-14 NOTE — Progress Notes (Addendum)
 PT Cancellation Note  Patient Details Name: [name] MRN: [id number] DOB: [birth year]   Cancelled Treatment:    Reason Eval/Treat Not Completed: PT screened, no needs identified, will sign off. Per OT, no acute PT needs-PT eval not needed.    Dannial SQUIBB, PT Acute Rehabilitation  Office: [phone]

## 2024-07-15 ENCOUNTER — Telehealth: Payer: Self-pay

## 2024-07-15 NOTE — Telephone Encounter (Signed)
 Spoke with patient regarding upcoming appointment on 07/19/24 with Dr. Sherrod. Patient reported that she did not complete her CT scan due to recent gallbladder surgery so the appt with Dr. Sherrod will need to be rescheduled.  Informed patient that she may schedule her CT scan approximately two weeks out, and once the scan is scheduled, she should call our office so a follow-up visit with Dr. Sherrod can be arranged.  Patient verbalized understanding and stated she will call to schedule the CT scan and contact our office once it has been scheduled.

## 2024-07-15 NOTE — Telephone Encounter (Signed)
 Spoke with patient and CT scan is rescheduled for 07/29/24. Informed patient that our scheduling team will call with follow up appt to review the scan.  She voiced understanding.

## 2024-07-16 LAB — CULTURE, BLOOD (ROUTINE X 2)
Culture: NO GROWTH
Culture: NO GROWTH

## 2024-07-16 LAB — SURGICAL PATHOLOGY

## 2024-07-16 NOTE — Hospital Course (Signed)
 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  Patient reported abdominal pain, periumbilical, right upper quadrant, radiating to the back, 10 out of 10 sharp and intermittent with nausea and vomiting.  No hematemesis or any diarrhea. No prior episodes of similar abdominal pain. CT abdomen showed acute cholecystitis with gallbladder distention, wall thickening, surrounding inflammatory stranding and gallstones measuring up to 1.1 cm.  CBD dilation measuring 10 mm without a visible calcified ductal stone Lipase normal. GI, general surgery consulted  **Interim History  Patient underwent a laparoscopic cholecystectomy on 1027 and then underwent an ERCP on 07/13/2024.  She improved and diet was advanced and she was deemed medically stable for discharge.  Assessment and Plan:  Acute cholecystitis with choledocholithiasis - MRCP showed acute cholecystitis, also dilation of extrahepatic duct with several sub-5 mm sized dependent filling defects in the distal CBD compatible with choledocholithiasis - Underwent laparoscopic cholecystectomy on 10/27  - Underwent ERCP on 1028 and was found to have papillary stenosis and biliary sludge status post biliary symptomatically and balloon sweep with no leaks and duodenal ulcers. - Diet was advanced and LFTs are improving.  She is medically stable for discharge only to follow-up with PCP within 1 to 2 weeks  Abnormal LFTs: In the setting of above. Improving. AST is now 64 and ALT is now 107. CTM and Trend and repeat CMP in the outpatient setting    Duodenal ulcers: Found incidentally on ERCP.  She has recommended continuing Protonix  and following H. pylori biopsies   COPD: No acute wheezing or shortness of breath. Continue Flonase , Breo Ellipta , albuterol   inhaler as needed   Hypothyroidism: TSH was 10.400, continue Synthroid  and repeat TFTs in 4 to 6 weeks and consider increasing the dose at that time if TSH is elevated   Malignant neoplasm of lung metastatic to brain [facility]): Non-small cell lung CA, adeno CA, initially diagnosed as stage IIIa in April 2023, underwent neoadjuvant treatment for brain metastasis, right posterior parietal craniotomy and SRS.  Outpatient follow-up with Dr. Sherrod  Metabolic Acidosis: Mild. CO2 is now 21, anion gap is 11, chloride level is 100.  Continue monitoring trend and repeat CMP within 1 week  Normocytic Anemia: Hgb/Hct Trend:  Recent Labs  Lab 07/11/24 1025 07/12/24 0508 07/13/24 0507 07/14/24 0530  HGB 12.3 10.3* 9.8* 9.8*  HCT 37.0 33.6* 33.0* 30.8*  MCV 94.4 99.4 101.2* 98.1  -Check Anemia Panel in the outpatient setting. Repeat CBC w/in 1 week  Hypoalbuminemia: Patient's Albumin  Lvl went from 4.2 -> 3.5 -> 3.1 -> 3.2. CTM and Trend and Repeat CMP in the AM

## 2024-07-19 ENCOUNTER — Inpatient Hospital Stay: Admitting: Internal Medicine

## 2024-07-19 ENCOUNTER — Telehealth: Payer: Self-pay | Admitting: Internal Medicine

## 2024-07-19 NOTE — Telephone Encounter (Signed)
 Rescheduled patient's appointment for December since she wont be getting her scan done until November 13th. Called and spoke with the patient, she is aware.

## 2024-07-29 ENCOUNTER — Inpatient Hospital Stay: Attending: Internal Medicine

## 2024-07-29 ENCOUNTER — Ambulatory Visit (HOSPITAL_COMMUNITY)
Admission: RE | Admit: 2024-07-29 | Discharge: 2024-07-29 | Disposition: A | Discharge status: Home / Self Care | Source: Ambulatory Visit | Attending: Internal Medicine | Admitting: Internal Medicine

## 2024-07-29 DIAGNOSIS — C3490 Malignant neoplasm of unspecified part of unspecified bronchus or lung: Secondary | ICD-10-CM | POA: Insufficient documentation

## 2024-07-29 DIAGNOSIS — C3411 Malignant neoplasm of upper lobe, right bronchus or lung: Secondary | ICD-10-CM | POA: Insufficient documentation

## 2024-07-29 DIAGNOSIS — C7931 Secondary malignant neoplasm of brain: Secondary | ICD-10-CM | POA: Insufficient documentation

## 2024-07-29 LAB — CMP (CANCER CENTER ONLY)
ALT: 10 U/L (ref 0–44)
AST: 17 U/L (ref 15–41)
Albumin: 4.5 g/dL (ref 3.5–5.0)
Alkaline Phosphatase: 76 U/L (ref 38–126)
Anion gap: 7 (ref 5–15)
BUN: 7 mg/dL — ABNORMAL LOW (ref 8–23)
CO2: 29 mmol/L (ref 22–32)
Calcium: 9.5 mg/dL (ref 8.9–10.3)
Chloride: 103 mmol/L (ref 98–111)
Creatinine: 0.64 mg/dL (ref 0.44–1.00)
GFR, Estimated: >60 mL/min (ref >60)
Glucose, Bld: 103 mg/dL — ABNORMAL HIGH (ref 70–99)
Potassium: 4.4 mmol/L (ref 3.5–5.1)
Sodium: 139 mmol/L (ref 135–145)
Total Bilirubin: 0.5 mg/dL (ref 0.0–1.2)
Total Protein: 7.1 g/dL (ref 6.5–8.1)

## 2024-07-29 LAB — CBC WITH DIFFERENTIAL (CANCER CENTER ONLY)
Abs Immature Granulocytes: 0.05 K/uL (ref 0.00–0.07)
Basophils Absolute: 0.1 K/uL (ref 0.0–0.1)
Basophils Relative: 1 %
Eosinophils Absolute: 0.1 K/uL (ref 0.0–0.5)
Eosinophils Relative: 1 %
HCT: 37.6 % (ref 36.0–46.0)
Hemoglobin: 12.3 g/dL (ref 12.0–15.0)
Immature Granulocytes: 1 %
Lymphocytes Relative: 20 %
Lymphs Abs: 1.5 K/uL (ref 0.7–4.0)
MCH: 31.4 pg (ref 26.0–34.0)
MCHC: 32.7 g/dL (ref 30.0–36.0)
MCV: 95.9 fL (ref 80.0–100.0)
Monocytes Absolute: 0.7 K/uL (ref 0.1–1.0)
Monocytes Relative: 9 %
Neutro Abs: 5.3 K/uL (ref 1.7–7.7)
Neutrophils Relative %: 68 %
Platelet Count: 336 K/uL (ref 150–400)
RBC: 3.92 MIL/uL (ref 3.87–5.11)
RDW: 14.6 % (ref 11.5–15.5)
WBC Count: 7.7 K/uL (ref 4.0–10.5)
nRBC: 0 % (ref 0.0–0.2)

## 2024-07-29 MED ORDER — IOHEXOL 9 MG/ML PO SOLN
ORAL | Status: AC
  Filled 2024-07-29: qty 1000

## 2024-07-29 MED ORDER — IOHEXOL 9 MG/ML PO SOLN
500.0000 mL | ORAL | Status: AC
  Administered 2024-07-29 (×2): 500 mL via ORAL

## 2024-07-29 MED ORDER — IOHEXOL 300 MG/ML  SOLN
100.0000 mL | Freq: Once | INTRAMUSCULAR | Status: AC | PRN
  Administered 2024-07-29: 100 mL via INTRAVENOUS

## 2024-08-05 ENCOUNTER — Telehealth: Payer: Self-pay | Admitting: Gastroenterology

## 2024-08-05 NOTE — Telephone Encounter (Signed)
 Left message for patient to call back

## 2024-08-05 NOTE — Telephone Encounter (Signed)
 Patient requesting f/u call in regards to plan of care. Please advise.   Thank you

## 2024-08-06 NOTE — Telephone Encounter (Signed)
 Left message for patient to call back

## 2024-08-06 NOTE — Telephone Encounter (Signed)
Patient returned call.     Please advise, thank you.

## 2024-08-06 NOTE — Telephone Encounter (Signed)
 Spoke with patient & she just wanted to clarify how often Dr. Charlanne recommended she take pantoprazole . Reviewed procedure report & patient was instructed to take pantoprazole  40 mg once daily. Pt verbalized all understanding.

## 2024-08-25 ENCOUNTER — Inpatient Hospital Stay: Attending: Internal Medicine | Admitting: Internal Medicine

## 2024-08-25 VITALS — BP 100/57 | HR 73 | Temp 98.6°F | Resp 17 | Ht 63.0 in | Wt 106.7 lb

## 2024-08-25 DIAGNOSIS — C3490 Malignant neoplasm of unspecified part of unspecified bronchus or lung: Secondary | ICD-10-CM | POA: Diagnosis not present

## 2024-08-25 DIAGNOSIS — C771 Secondary and unspecified malignant neoplasm of intrathoracic lymph nodes: Secondary | ICD-10-CM | POA: Insufficient documentation

## 2024-08-25 DIAGNOSIS — C7931 Secondary malignant neoplasm of brain: Secondary | ICD-10-CM | POA: Insufficient documentation

## 2024-08-25 DIAGNOSIS — C3411 Malignant neoplasm of upper lobe, right bronchus or lung: Secondary | ICD-10-CM | POA: Insufficient documentation

## 2024-08-25 NOTE — Progress Notes (Signed)
 [name] [phone]   [phone]  OFFICE PROGRESS NOTE  Christina Che, PA [address] [name] [address]  DIAGNOSIS: Metastatic non-small cell lung cancer initially diagnosed as stage IIIA (T1b, N2, M0) non-small cell lung cancer, adenocarcinoma presented with right upper lobe lung nodule as well as right upper paratracheal adenopathy and suspicious tiny nodule in the right middle lobe.  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  First dose 05/30/2020. Status post 3 cycles. 2) status post robotic assisted right thoracoscopy with right upper lobectomy and lymph node dissection under the care of [name] on October 19, 2020.  There was residual tumor measuring 0.5 cm with lymph node metastasis involving 4R, 12 and hilar lymph nodes. 3) Adjuvant systemic chemotherapy with carboplatin  for AUC of 5 and Alimta  500 Mg/M2 every 3 weeks.  First dose was given December 11, 2020.  Status post 4 cycles.  Last dose of chemotherapy was given Feb 13, 2021. 4) status post right posterior parietal craniotomy with resection of the brain mass under the care of [name] followed by [name] to the solitary brain metastasis under the care of [name].  The final pathology was consistent with metastatic adenocarcinoma of lung primary. 5) Adjuvant immunotherapy with Atezolizumab  1200 Mg IV every 3 weeks.  First dose March 27, 2021.  Status post 11 cycles.  Last cycle was given on November 12, 2021 this treatment was discontinued secondary to disease progression in the brain. 6) SBRT to treat the left lung nodule under the care of [name] October 31, 2023.   CURRENT THERAPY: Observation  INTERVAL HISTORY: [name] 73 y.o.  female returns to the clinic today for 31-month follow-up visit. Discussed the use of AI scribe software for clinical note transcription with the patient, who gave verbal consent to proceed.  History of Present Illness [name] is a 73 year old female with metastatic non-small cell lung cancer who presents for evaluation with repeat CT scan for restaging of her disease.  She has a history of metastatic non-small cell lung cancer, adenocarcinoma, initially diagnosed as stage IIIA in August 2021. In April 2023, a solitary brain metastasis was identified. She has undergone neoadjuvant systemic chemotherapy, right upper lobectomy with lymph node dissection, and adjuvant treatment.  She feels 'pretty good' overall, with no new complaints. She experiences some pain on the right side following recent gallbladder surgery. No chest pain, shortness of breath, or cough. No nausea, vomiting, or diarrhea. She notes some weight loss, which she attributes to stress related to her sister.    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  MEDICATIONS:  Current Outpatient Medications  Medication Sig Dispense Refill   acetaminophen  (TYLENOL ) 500 MG tablet Take 2 tablets (1,000 mg total) by mouth 4 (four) times daily. 120 tablet 3   albuterol  (PROVENTIL  HFA;VENTOLIN  HFA) 108 (90 Base) MCG/ACT inhaler Inhale 2 puffs into the lungs every 4 (four) hours as needed for wheezing or shortness of breath (cough, shortness of breath or wheezing.). 1 Inhaler 1   alendronate (  FOSAMAX) 70 MG tablet Take 70 mg by mouth once a week.     calcium  carbonate (OS-CAL) 600 MG TABS tablet Take 600 mg by mouth daily with breakfast.     Cholecalciferol  (VITAMIN D3 PO) Take 2,000 Units by mouth daily.     fluticasone  (FLONASE ) 50 MCG/ACT nasal spray Place 2 sprays into both  nostrils daily. 16 g 12   Fluticasone -Salmeterol (ADVAIR) 250-50 MCG/DOSE AEPB Inhale 1 puff into the lungs 2 (two) times daily.  (Patient not taking: Reported on 07/11/2024)     ibuprofen  (ADVIL ) 600 MG tablet Take 1 tablet (600 mg total) by mouth 4 (four) times daily. 120 tablet 1   levothyroxine  (SYNTHROID ) 75 MCG tablet TAKE 1 TABLET BY MOUTH EVERY DAY BEFORE BREAKFAST 30 tablet 2   methocarbamol  (ROBAXIN ) 750 MG tablet Take 1 tablet (750 mg total) by mouth 4 (four) times daily. 120 tablet 1   Multiple Vitamin (MULTIVITAMIN) tablet Take 1 tablet by mouth daily.     ondansetron  (ZOFRAN ) 4 MG tablet Take 1 tablet (4 mg total) by mouth every 6 (six) hours as needed for nausea. 20 tablet 0   pantoprazole  (PROTONIX ) 40 MG tablet Take 1 tablet (40 mg total) by mouth 2 (two) times daily. 60 tablet 0   polyethylene glycol (MIRALAX ) 17 g packet Take 17 g by mouth daily. 14 each 0   polyethylene glycol powder (GLYCOLAX /MIRALAX ) 17 GM/SCOOP powder Take 17 g by mouth daily. 510 g 1   traMADol  (ULTRAM ) 50 MG tablet Take 1 tablet (50 mg total) by mouth every 4 (four) hours as needed for severe pain (pain score 7-10). 15 tablet 0   No current facility-administered medications for this visit.    SURGICAL HISTORY:  Past Surgical History:  Procedure Laterality Date   APPENDECTOMY  2008   APPLICATION OF CRANIAL NAVIGATION Right 01/11/2022   Procedure: APPLICATION OF CRANIAL NAVIGATION;  Surgeon: [name];  Location: [facility];  Service: Neurosurgery;  Laterality: Right;   CHOLECYSTECTOMY N/A 07/12/2024   Procedure: LAPAROSCOPIC CHOLECYSTECTOMY WITH INTRAOPERATIVE CHOLANGIOGRAM;  Surgeon: [name];  Location: [facility];  Service: General;  Laterality: N/A;   COLON SURGERY  2008   colostomy-infection post op appendectomy   COLOSTOMY REVERSAL  2009   CRANIOTOMY Right 01/11/2022   Procedure: RIGHT CRANIOTOMY FOR TUMOR RESECTION WITH BRAIN LAB;  Surgeon: [name];  Location: [name];   Service: Neurosurgery;  Laterality: Right;   ERCP N/A 07/13/2024   Procedure: ERCP, WITH INTERVENTION IF INDICATED;  Surgeon: Charlanne Groom, [facility];  Location: [facility];  Service: Gastroenterology;  Laterality: N/A;   INTERCOSTAL NERVE BLOCK Right 10/19/2020   Procedure: INTERCOSTAL NERVE BLOCK;  Surgeon: [name] Elspeth BROCKS, [facility];  Location: [facility];  Service: Thoracic;  Laterality: Right;   NODE DISSECTION Right 10/19/2020   Procedure: NODE DISSECTION;  Surgeon: [name] Elspeth BROCKS, [facility];  Location: [facility];  Service: Thoracic;  Laterality: Right;   VIDEO BRONCHOSCOPY WITH ENDOBRONCHIAL NAVIGATION N/A 05/08/2020   Procedure: VIDEO BRONCHOSCOPY WITH ENDOBRONCHIAL NAVIGATION;  Surgeon: [name] Elspeth BROCKS, [facility];  Location: [facility] OR;  Service: Thoracic;  Laterality: N/A;   VIDEO BRONCHOSCOPY WITH ENDOBRONCHIAL ULTRASOUND N/A 05/08/2020   Procedure: VIDEO BRONCHOSCOPY WITH ENDOBRONCHIAL ULTRASOUND;  Surgeon: [name] Elspeth BROCKS, [facility];  Location: [facility] OR;  Service: Thoracic;  Laterality: N/A;    REVIEW OF SYSTEMS:  Constitutional: negative Eyes: negative Ears, nose, mouth, throat, and face: negative Respiratory: negative Cardiovascular: negative Gastrointestinal: negative Genitourinary:negative Integument/breast: negative Hematologic/lymphatic: negative Musculoskeletal:negative  Neurological: negative Behavioral/Psych: negative Endocrine: negative Allergic/Immunologic: negative   PHYSICAL EXAMINATION: General appearance: alert, cooperative, and no distress Head: Normocephalic, without obvious abnormality, atraumatic Neck: no adenopathy, no JVD, supple, symmetrical, trachea midline, and thyroid  not enlarged, symmetric, no tenderness/mass/nodules Lymph nodes: Cervical, supraclavicular, and axillary nodes normal. Resp: clear to auscultation bilaterally Back: symmetric, no curvature. ROM normal. No CVA tenderness. Cardio: regular rate and rhythm, S1, S2 normal, no murmur, click, rub or gallop GI: soft,  non-tender; bowel sounds normal; no masses,  no organomegaly Extremities: extremities normal, atraumatic, no cyanosis or edema Neurologic: Alert and oriented X 3, normal strength and tone. Normal symmetric reflexes. Normal coordination and gait  ECOG PERFORMANCE STATUS: 1 - Symptomatic but completely ambulatory  Blood pressure (!) 100/57, pulse 73, temperature 98.6 F (37 C), temperature source Temporal, resp. rate 17, height 5' 3 (1.6 m), weight 106 lb 11.2 oz (48.4 kg), SpO2 98%.  LABORATORY DATA: Lab Results  Component Value Date   WBC 7.7 07/29/2024   HGB 12.3 07/29/2024   HCT 37.6 07/29/2024   MCV 95.9 07/29/2024   PLT 336 07/29/2024      Chemistry      Component Value Date/Time   NA 139 07/29/2024 0848   NA 135 10/29/2017 1131   K 4.4 07/29/2024 0848   CL 103 07/29/2024 0848   CO2 29 07/29/2024 0848   BUN 7 (L) 07/29/2024 0848   BUN 5 (L) 10/29/2017 1131   CREATININE 0.64 07/29/2024 0848      Component Value Date/Time   CALCIUM  9.5 07/29/2024 0848   ALKPHOS 76 07/29/2024 0848   AST 17 07/29/2024 0848   ALT 10 07/29/2024 0848   BILITOT 0.5 07/29/2024 0848       RADIOGRAPHIC STUDIES: CT CHEST ABDOMEN PELVIS W CONTRAST Result Date: 08/02/2024 CLINICAL DATA:  Non-small cell lung cancer staging. * Tracking Code: BO * EXAM: CT CHEST, ABDOMEN, AND PELVIS WITH CONTRAST TECHNIQUE: Multidetector CT imaging of the chest, abdomen and pelvis was performed following the standard protocol during bolus administration of intravenous contrast. RADIATION DOSE REDUCTION: This exam was performed according to the departmental dose-optimization program which includes automated exposure control, adjustment of the mA and/or kV according to patient size and/or use of iterative reconstruction technique. CONTRAST:  OMNIPAQUE  IOHEXOL  300 MG/ML  SOLN COMPARISON:  MR abdomen 07/12/2024, CT abdomen pelvis 07/11/2024, PET 09/11/2023 and CT chest 04/13/2019. FINDINGS: CT CHEST FINDINGS  Cardiovascular: Atherosclerotic calcification of the aorta, aortic valve and coronary arteries. Heart is at the upper limits of normal in size. No pericardial effusion. Mediastinum/Nodes: No pathologically enlarged mediastinal, hilar or axillary lymph nodes. Esophagus is grossly unremarkable. Lungs/Pleura: Centrilobular emphysema. Right upper lobectomy. Small nodules in the superior segment left lower lobe measure up to 6 mm (4/58), stable. No pleural fluid. Debris in the airway. Musculoskeletal: Degenerative changes in the spine. Avascular necrosis in the humeral heads. No worrisome lytic or sclerotic lesions. CT ABDOMEN PELVIS FINDINGS Hepatobiliary: Liver is unremarkable. Cholecystectomy. No biliary ductal dilatation. Pancreas: Negative. Spleen: Negative. Adrenals/Urinary Tract: Adrenal glands are unremarkable. Renal sinus cysts bilaterally. No specific follow-up necessary. Ureters are decompressed. Bladder is grossly unremarkable. Stomach/Bowel: Stomach, small bowel and colon are unremarkable. Appendix is not visualized. Vascular/Lymphatic: Atherosclerotic calcification of the aorta. No pathologically enlarged lymph nodes. Reproductive: Uterus is visualized.  No adnexal mass. Other: No free fluid.  Mesenteries and peritoneum are unremarkable. Musculoskeletal: Degenerative changes in the spine. Avascular necrosis in the femoral heads. Levoconvex scoliosis of the lumbar spine. IMPRESSION: 1. Right  upper lobectomy with stable small nodules in the superior segment left lower lobe. No evidence of disease progression. 2. 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 3. Aortic atherosclerosis (ICD10-I70.0). Coronary artery calcification. 4.  Emphysema (ICD10-J43.9). Electronically Signed   By: Newell Eke M.D.   On: 08/02/2024 12:48    ASSESSMENT AND PLAN: This is a very pleasant 73 years old white female with metastatic non-small cell lung cancer, adenocarcinoma that was initially diagnosed  as stage IIIa (T1b, N2, M0) non-small cell lung cancer, adenocarcinoma presented with right upper lobe lung nodule in addition to mediastinal lymphadenopathy.  PD-L1 expression 90%.  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   She tolerated this treatment well with no concerning adverse effect except for dry skin and itching. Her repeat CT scan after the neoadjuvant treatment showed partial response with around 50% reduction in the tumor volume. The patient underwent right upper lobectomy with lymph node dissection on October 19, 2020 and the final pathology showed residual tumor measuring 0.5 cm with lymph node metastasis involving hilar and mediastinal lymph nodes. She underwent adjuvant systemic chemotherapy with carboplatin  for AUC of 5, Alimta  500 mg/M2 every 3 weeks since she has good response to this treatment in the past.  She is status post 4 cycles.  She tolerated this treatment well with no concerning adverse effect except for mild fatigue. She underwent adjuvant immunotherapy with Tecentriq  1200 Mg IV every 3 weeks status post 12 cycles.  Last dose was given on November 12, 2021 discontinued after the patient develop metastatic brain lesions and she has been off treatment since her diagnosis with the brain metastasis.. The patient underwent craniotomy with resection of the solitary brain metastasis followed by SRS to the surgical cavity. The patient has been on observation since that time. Her follow-up CT scan showed no new finding for disease progression except for 1.0 cm spiculated lung nodule within the medial aspect of the superior segment of the left lower lobe suspicious for disease recurrence. She had a PET scan that showed hypermetabolic in the superior segment left lower lobe  pulmonary nodule suspicious for new malignancy.  She was treated with SBRT to the left lower lobe pulmonary nodule under the care of [name] on October 31 2023.  She is currently on observation. She had repeat CT scan of the chest, abdomen and pelvis performed recently.  I personally independently reviewed the scan and discussed the result with the patient today.  Her scan showed no concerning findings for disease progression. Assessment and Plan Assessment & Plan Metastatic lung adenocarcinoma with brain metastasis Metastatic non-small cell lung cancer, adenocarcinoma type, initially diagnosed as stage IIIA in August 2021 with solitary brain metastasis in April 2023. Recent CT scan of the chest, abdomen, and pelvis shows no evidence of disease progression. She reports no new symptoms such as chest pain, shortness of breath, or significant weight loss. Current status is well-managed with no signs of recurrence. - Continue follow-up every six months for monitoring. - Advised to contact the office if any new symptoms or concerns arise. She was advised to call immediately if she has any other concerning symptoms in the interval.  The patient voices understanding of current disease status and treatment options and is in agreement with the current  care plan.  All questions were answered. The patient knows to call the clinic with any problems, questions or concerns. We can certainly see the patient much sooner if necessary. The total time spent in the appointment was 30 minutes. Disclaimer: This note was dictated with voice recognition software. Similar sounding words can inadvertently be transcribed and may not be corrected upon review.

## 2024-08-26 ENCOUNTER — Telehealth: Payer: Self-pay | Admitting: Internal Medicine

## 2024-08-26 NOTE — Telephone Encounter (Signed)
 Scheduled patient for next appointments. Called and left a voicemail with the details.

## 2024-10-12 ENCOUNTER — Other Ambulatory Visit

## 2024-10-18 ENCOUNTER — Encounter

## 2024-10-20 ENCOUNTER — Ambulatory Visit: Admitting: Urology

## 2024-11-02 ENCOUNTER — Other Ambulatory Visit

## 2024-11-08 ENCOUNTER — Inpatient Hospital Stay: Attending: Internal Medicine

## 2024-11-10 ENCOUNTER — Ambulatory Visit: Admitting: Urology

## 2025-02-15 ENCOUNTER — Inpatient Hospital Stay

## 2025-02-23 ENCOUNTER — Inpatient Hospital Stay: Admitting: Internal Medicine
